# Patient Record
Sex: Male | Born: 1950
Health system: Southern US, Community
[De-identification: ages and names within clinical notes are randomized; demographics above are authoritative.]

## PROBLEM LIST (undated history)

## (undated) DIAGNOSIS — D702 Other drug-induced agranulocytosis: Secondary | ICD-10-CM

## (undated) DIAGNOSIS — J154 Pneumonia due to other streptococci: Secondary | ICD-10-CM

## (undated) DIAGNOSIS — E669 Obesity, unspecified: Secondary | ICD-10-CM

## (undated) DIAGNOSIS — I509 Heart failure, unspecified: Secondary | ICD-10-CM

## (undated) DIAGNOSIS — Z9889 Other specified postprocedural states: Secondary | ICD-10-CM

## (undated) DIAGNOSIS — D649 Anemia, unspecified: Secondary | ICD-10-CM

## (undated) DIAGNOSIS — K219 Gastro-esophageal reflux disease without esophagitis: Secondary | ICD-10-CM

## (undated) DIAGNOSIS — I272 Pulmonary hypertension, unspecified: Secondary | ICD-10-CM

## (undated) DIAGNOSIS — Z5111 Encounter for antineoplastic chemotherapy: Secondary | ICD-10-CM

## (undated) DIAGNOSIS — E039 Hypothyroidism, unspecified: Secondary | ICD-10-CM

## (undated) DIAGNOSIS — C3490 Malignant neoplasm of unspecified part of unspecified bronchus or lung: Secondary | ICD-10-CM

## (undated) DIAGNOSIS — C8590 Non-Hodgkin lymphoma, unspecified, unspecified site: Secondary | ICD-10-CM

## (undated) DIAGNOSIS — K56609 Unspecified intestinal obstruction, unspecified as to partial versus complete obstruction: Secondary | ICD-10-CM

## (undated) DIAGNOSIS — D751 Secondary polycythemia: Secondary | ICD-10-CM

## (undated) DIAGNOSIS — J449 Chronic obstructive pulmonary disease, unspecified: Secondary | ICD-10-CM

## (undated) DIAGNOSIS — F32A Depression, unspecified: Secondary | ICD-10-CM

## (undated) DIAGNOSIS — C8258 Diffuse follicle center lymphoma, lymph nodes of multiple sites: Secondary | ICD-10-CM

## (undated) DIAGNOSIS — N400 Enlarged prostate without lower urinary tract symptoms: Secondary | ICD-10-CM

## (undated) DIAGNOSIS — M1990 Unspecified osteoarthritis, unspecified site: Secondary | ICD-10-CM

## (undated) DIAGNOSIS — F329 Major depressive disorder, single episode, unspecified: Secondary | ICD-10-CM

## (undated) DIAGNOSIS — A419 Sepsis, unspecified organism: Secondary | ICD-10-CM

## (undated) DIAGNOSIS — I10 Essential (primary) hypertension: Secondary | ICD-10-CM

## (undated) HISTORY — DX: Essential (primary) hypertension: I10

## (undated) HISTORY — DX: Heart failure, unspecified: I50.9

## (undated) HISTORY — DX: Depression, unspecified: F32.A

## (undated) HISTORY — DX: Encounter for antineoplastic chemotherapy: Z51.11

## (undated) HISTORY — DX: Malignant neoplasm of unspecified part of unspecified bronchus or lung: C34.90

## (undated) HISTORY — PX: COLONOSCOPY: SHX174

## (undated) HISTORY — DX: Anemia, unspecified: D64.9

## (undated) HISTORY — DX: Benign prostatic hyperplasia without lower urinary tract symptoms: N40.0

## (undated) HISTORY — DX: Other specified postprocedural states: Z98.890

## (undated) HISTORY — DX: Chronic obstructive pulmonary disease, unspecified: J44.9

## (undated) HISTORY — DX: Diffuse follicle center lymphoma, lymph nodes of multiple sites: C82.58

## (undated) HISTORY — DX: Obesity, unspecified: E66.9

## (undated) HISTORY — DX: Hypothyroidism, unspecified: E03.9

## (undated) HISTORY — PX: UPPER GI ENDOSCOPY: SHX6162

## (undated) HISTORY — DX: Sepsis, unspecified organism: A41.9

## (undated) HISTORY — DX: Other drug-induced agranulocytosis: D70.2

## (undated) HISTORY — DX: Secondary polycythemia: D75.1

## (undated) HISTORY — PX: APPENDECTOMY: SHX54

## (undated) HISTORY — DX: Pulmonary hypertension, unspecified: I27.20

## (undated) HISTORY — DX: Major depressive disorder, single episode, unspecified: F32.9

## (undated) HISTORY — DX: Pneumonia due to other streptococci: J15.4

## (undated) HISTORY — DX: Unspecified intestinal obstruction, unspecified as to partial versus complete obstruction: K56.609

## (undated) HISTORY — DX: Non-Hodgkin lymphoma, unspecified, unspecified site: C85.90

## (undated) HISTORY — PX: BACK SURGERY: SHX140

---

## 2002-06-13 LAB — HM COLONOSCOPY: HM Colonoscopy: NORMAL

## 2008-04-21 DIAGNOSIS — J154 Pneumonia due to other streptococci: Secondary | ICD-10-CM

## 2008-04-21 HISTORY — DX: Pneumonia due to other streptococci: J15.4

## 2008-05-01 ENCOUNTER — Encounter: Payer: Self-pay | Admitting: Cardiology

## 2008-05-01 ENCOUNTER — Ambulatory Visit: Payer: Self-pay | Admitting: Pulmonary Disease

## 2008-05-01 ENCOUNTER — Ambulatory Visit: Payer: Self-pay | Admitting: Cardiology

## 2008-05-01 ENCOUNTER — Inpatient Hospital Stay (HOSPITAL_COMMUNITY): Admission: EM | Admit: 2008-05-01 | Discharge: 2008-05-21 | Payer: Self-pay | Admitting: Emergency Medicine

## 2008-05-09 ENCOUNTER — Ambulatory Visit: Payer: Self-pay | Admitting: Internal Medicine

## 2008-05-21 ENCOUNTER — Ambulatory Visit: Payer: Self-pay | Admitting: Internal Medicine

## 2008-05-29 ENCOUNTER — Other Ambulatory Visit: Payer: Self-pay | Admitting: Internal Medicine

## 2008-05-29 LAB — CBC WITH DIFFERENTIAL/PLATELET
BASO%: 0.2 % (ref 0.0–2.0)
LYMPH%: 14.4 % (ref 14.0–48.0)
MCHC: 32.3 g/dL (ref 32.0–35.9)
MONO#: 0.7 10*3/uL (ref 0.1–0.9)
MONO%: 9.4 % (ref 0.0–13.0)
NEUT#: 5.6 10*3/uL (ref 1.5–6.5)
Platelets: 222 10*3/uL (ref 145–400)
RBC: 4.96 10*6/uL (ref 4.20–5.71)
RDW: 16.8 % — ABNORMAL HIGH (ref 11.2–14.6)
WBC: 7.9 10*3/uL (ref 4.0–10.0)

## 2008-05-29 LAB — LACTATE DEHYDROGENASE: LDH: 136 U/L (ref 94–250)

## 2008-05-31 ENCOUNTER — Encounter: Payer: Self-pay | Admitting: Internal Medicine

## 2008-06-04 ENCOUNTER — Telehealth (INDEPENDENT_AMBULATORY_CARE_PROVIDER_SITE_OTHER): Payer: Self-pay | Admitting: *Deleted

## 2008-06-05 ENCOUNTER — Ambulatory Visit: Payer: Self-pay | Admitting: Internal Medicine

## 2008-06-05 DIAGNOSIS — D751 Secondary polycythemia: Secondary | ICD-10-CM

## 2008-06-05 DIAGNOSIS — I10 Essential (primary) hypertension: Secondary | ICD-10-CM | POA: Insufficient documentation

## 2008-06-05 DIAGNOSIS — J449 Chronic obstructive pulmonary disease, unspecified: Secondary | ICD-10-CM | POA: Insufficient documentation

## 2008-06-05 HISTORY — DX: Secondary polycythemia: D75.1

## 2008-06-05 HISTORY — DX: Essential (primary) hypertension: I10

## 2008-06-26 LAB — CBC WITH DIFFERENTIAL/PLATELET
BASO%: 0.4 % (ref 0.0–2.0)
Basophils Absolute: 0 10*3/uL (ref 0.0–0.1)
EOS%: 3.2 % (ref 0.0–7.0)
HCT: 37.7 % — ABNORMAL LOW (ref 38.7–49.9)
HGB: 12.5 g/dL — ABNORMAL LOW (ref 13.0–17.1)
LYMPH%: 24.1 % (ref 14.0–48.0)
MCH: 30 pg (ref 28.0–33.4)
MCHC: 33.2 g/dL (ref 32.0–35.9)
MONO#: 1 10*3/uL — ABNORMAL HIGH (ref 0.1–0.9)
NEUT%: 58.2 % (ref 40.0–75.0)
Platelets: 249 10*3/uL (ref 145–400)

## 2008-07-02 ENCOUNTER — Ambulatory Visit: Payer: Self-pay | Admitting: Cardiology

## 2008-07-05 ENCOUNTER — Ambulatory Visit: Payer: Self-pay | Admitting: Internal Medicine

## 2008-07-20 ENCOUNTER — Ambulatory Visit: Payer: Self-pay | Admitting: Internal Medicine

## 2008-07-24 ENCOUNTER — Encounter (HOSPITAL_COMMUNITY): Admission: RE | Admit: 2008-07-24 | Discharge: 2008-09-25 | Payer: Self-pay | Admitting: Internal Medicine

## 2008-07-24 ENCOUNTER — Encounter: Payer: Self-pay | Admitting: Internal Medicine

## 2008-07-24 LAB — CBC WITH DIFFERENTIAL/PLATELET
Basophils Absolute: 0 10*3/uL (ref 0.0–0.1)
Eosinophils Absolute: 0.2 10*3/uL (ref 0.0–0.5)
HCT: 32.8 % — ABNORMAL LOW (ref 38.7–49.9)
HGB: 11.2 g/dL — ABNORMAL LOW (ref 13.0–17.1)
LYMPH%: 22.5 % (ref 14.0–48.0)
MCV: 89.6 fL (ref 81.6–98.0)
MONO%: 7.6 % (ref 0.0–13.0)
NEUT#: 4.8 10*3/uL (ref 1.5–6.5)
Platelets: 256 10*3/uL (ref 145–400)

## 2008-08-07 ENCOUNTER — Encounter: Payer: Self-pay | Admitting: Internal Medicine

## 2008-09-03 ENCOUNTER — Ambulatory Visit: Payer: Self-pay | Admitting: Internal Medicine

## 2008-09-24 ENCOUNTER — Ambulatory Visit: Payer: Self-pay | Admitting: Internal Medicine

## 2008-09-24 DIAGNOSIS — F528 Other sexual dysfunction not due to a substance or known physiological condition: Secondary | ICD-10-CM | POA: Insufficient documentation

## 2008-11-22 ENCOUNTER — Ambulatory Visit: Payer: Self-pay | Admitting: Cardiology

## 2008-12-03 ENCOUNTER — Ambulatory Visit: Payer: Self-pay | Admitting: Internal Medicine

## 2008-12-05 ENCOUNTER — Encounter: Payer: Self-pay | Admitting: Internal Medicine

## 2009-02-22 ENCOUNTER — Ambulatory Visit: Payer: Self-pay | Admitting: Internal Medicine

## 2009-02-26 ENCOUNTER — Encounter: Payer: Self-pay | Admitting: Internal Medicine

## 2009-02-26 LAB — CBC WITH DIFFERENTIAL/PLATELET
Basophils Absolute: 0 10*3/uL (ref 0.0–0.1)
Eosinophils Absolute: 0.2 10*3/uL (ref 0.0–0.5)
HCT: 40.4 % (ref 38.4–49.9)
HGB: 13.6 g/dL (ref 13.0–17.1)
MCH: 30.2 pg (ref 27.2–33.4)
MONO#: 0.7 10*3/uL (ref 0.1–0.9)
NEUT#: 4.5 10*3/uL (ref 1.5–6.5)
NEUT%: 65.6 % (ref 39.0–75.0)
lymph#: 1.4 10*3/uL (ref 0.9–3.3)

## 2009-05-23 IMAGING — CR DG CHEST 1V PORT
2 series · 2 of 2 positions shown · non-contrast
Comparison: 05/14/2008

CLINICAL DATA: Respiratory failure/on ventilator

PORTABLE CHEST - 1 VIEW

[AP (1 of 2)]
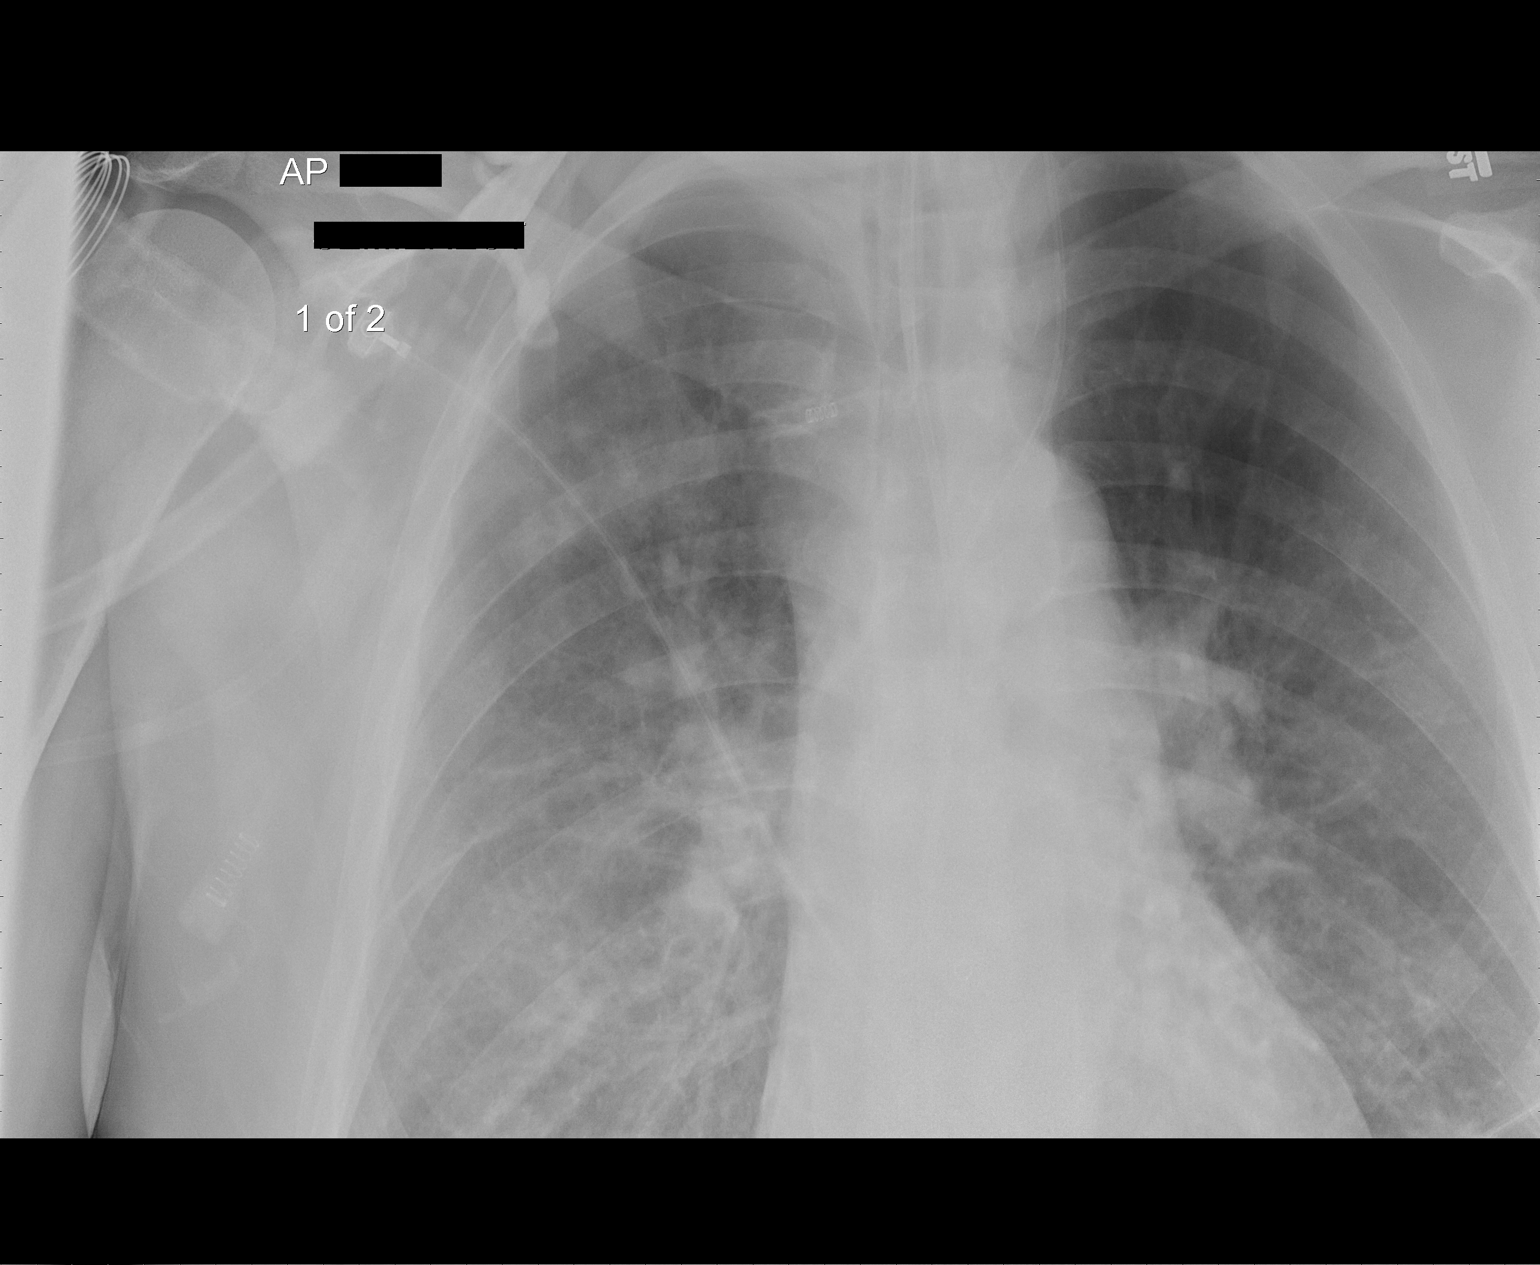

[AP (2 of 2)]
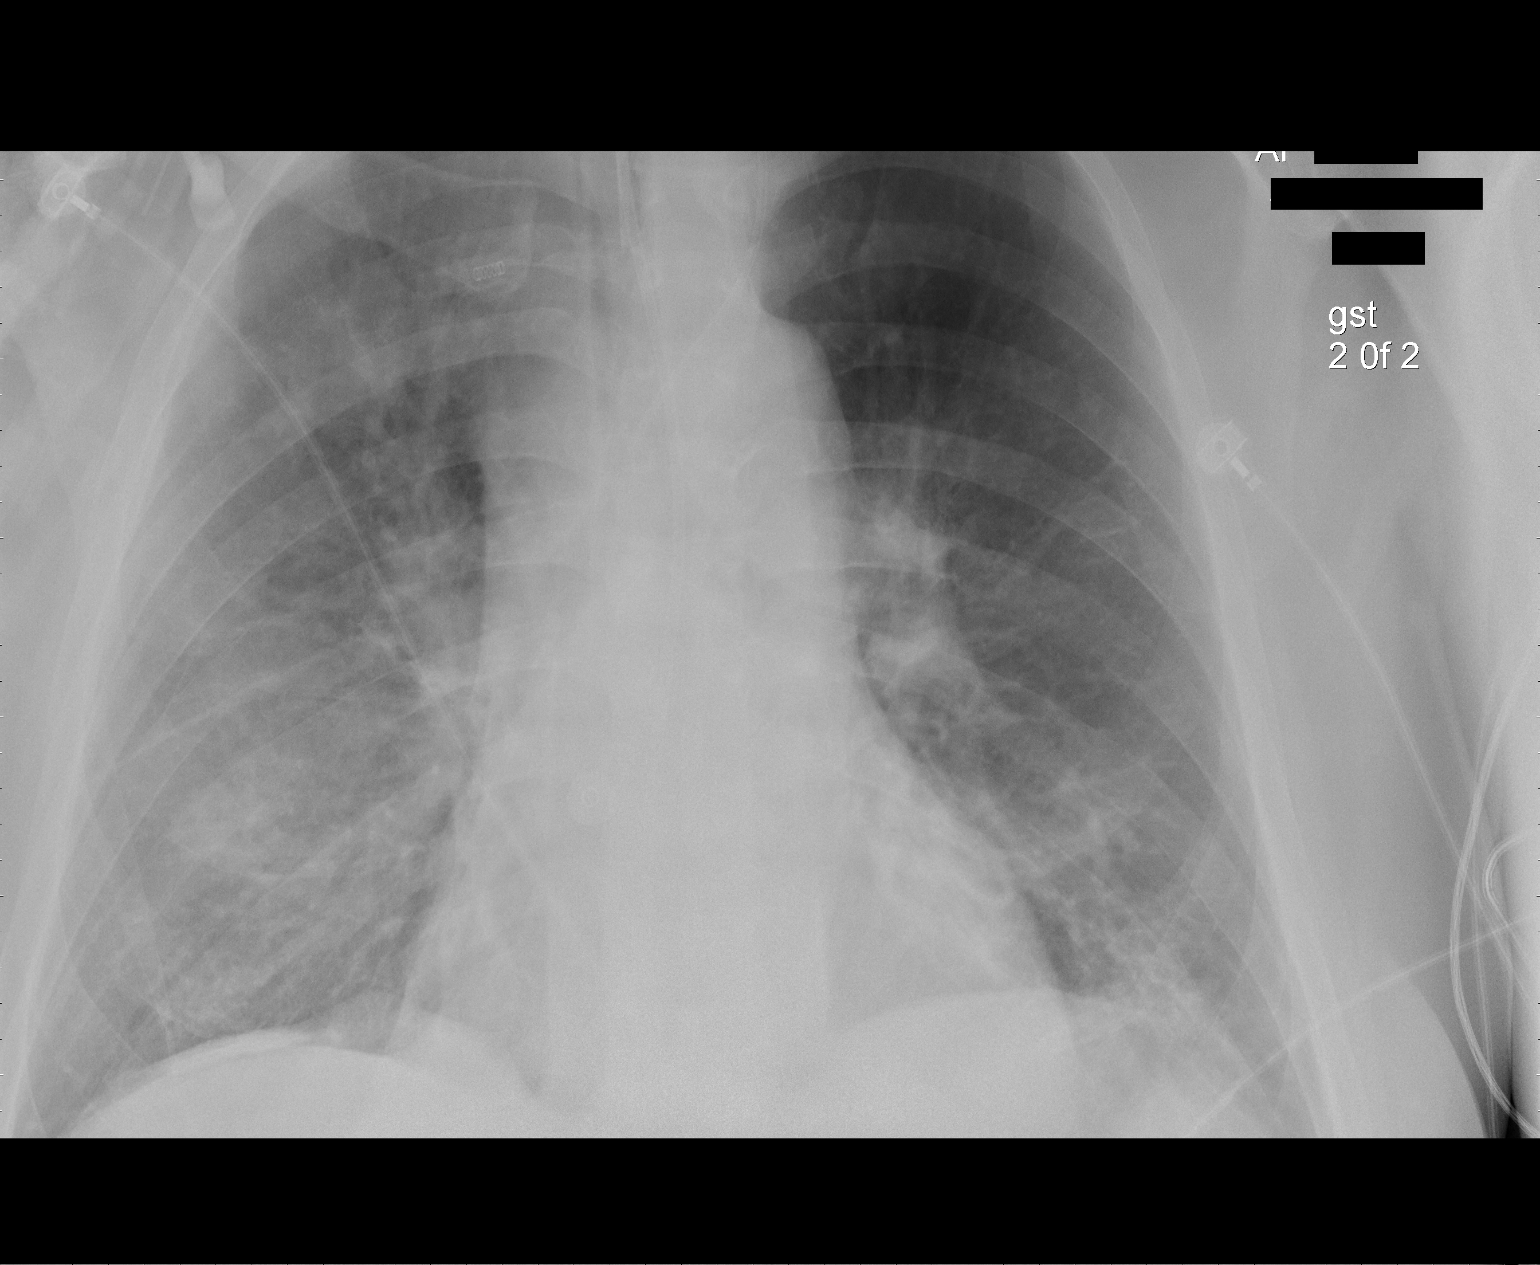

[2 of 2 positions shown; findings below may reference images not displayed]

FINDINGS: Bilateral lung densities are again noted.  There has been
slight interval worsening in the left lower lobe, and perhaps
minimal worsening in the right lower lobe, although the changes are
subtle.  Heart and mediastinal contours remain normal.  ET tube
unchanged.
IMPRESSION: 1.  Slight interval worsening in the left lower lobe and possibly
right lower lobe airspace densities.
2.  Support apparatus stable.

## 2009-05-23 IMAGING — CR DG ABD PORTABLE 1V
1 series · 1 of 1 positions shown · non-contrast
Comparison: 05/14/2008

CLINICAL DATA: Respiratory failure/follow-up ileus versus small
bowel obstruction

ABDOMEN - 1 VIEW

[AP]
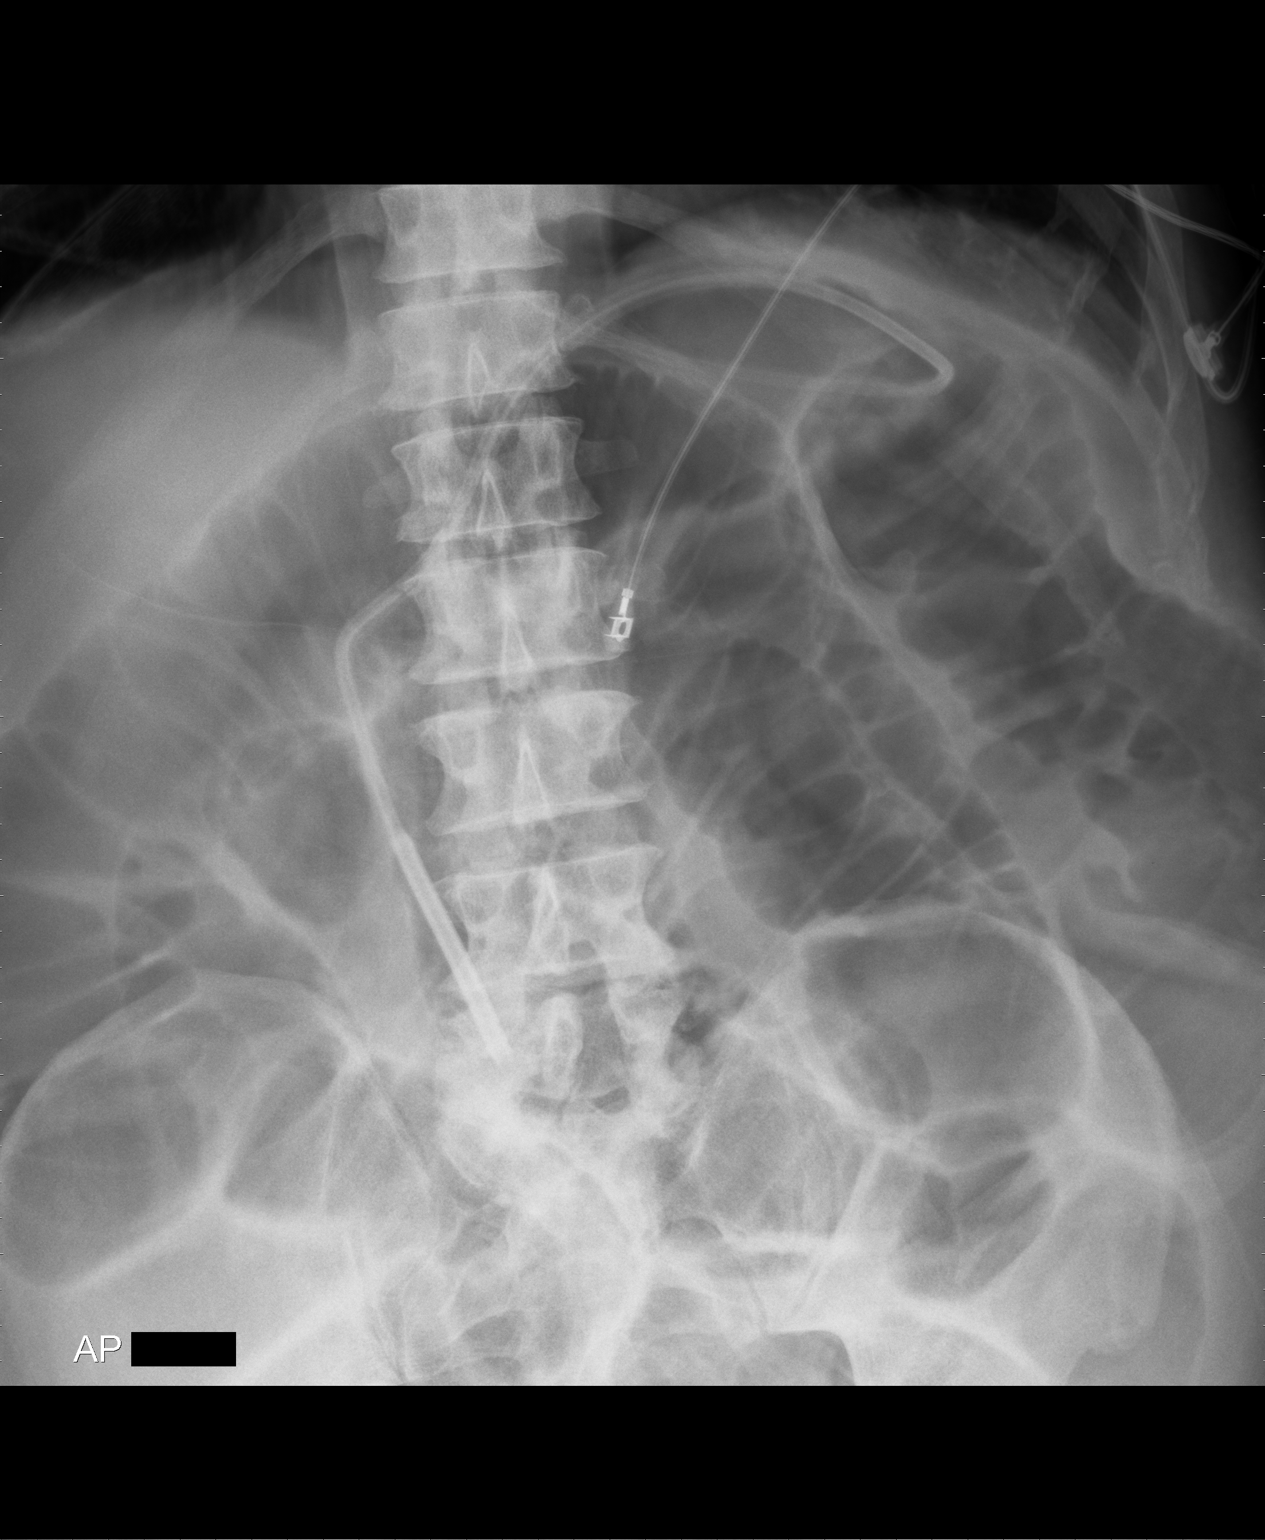

[1 of 1 positions shown; findings below may reference images not displayed]

FINDINGS: Diffuse dilatation of small bowel without significant
change.  The pattern is most consistent with obstruction since
there is little colon gas.  The

The sheath a feeding tube is now in the distal C-loop.  Formerly it
was coiled in the fundus of the stomach.  The
IMPRESSION: 1.  Diffuse dilatation of small bowel loops, most consistent with
obstruction, essentially unchanged.
2.  The feeding tube tip has advanced to the distal C-loop of the
duodenum.

## 2009-05-23 IMAGING — CR DG CHEST 1V PORT
1 series · 1 of 1 positions shown · non-contrast
Comparison: Chest radiograph of same date.

CLINICAL DATA: Respiratory failure.  PICC line placement.

PORTABLE CHEST - 1 VIEW

[AP]
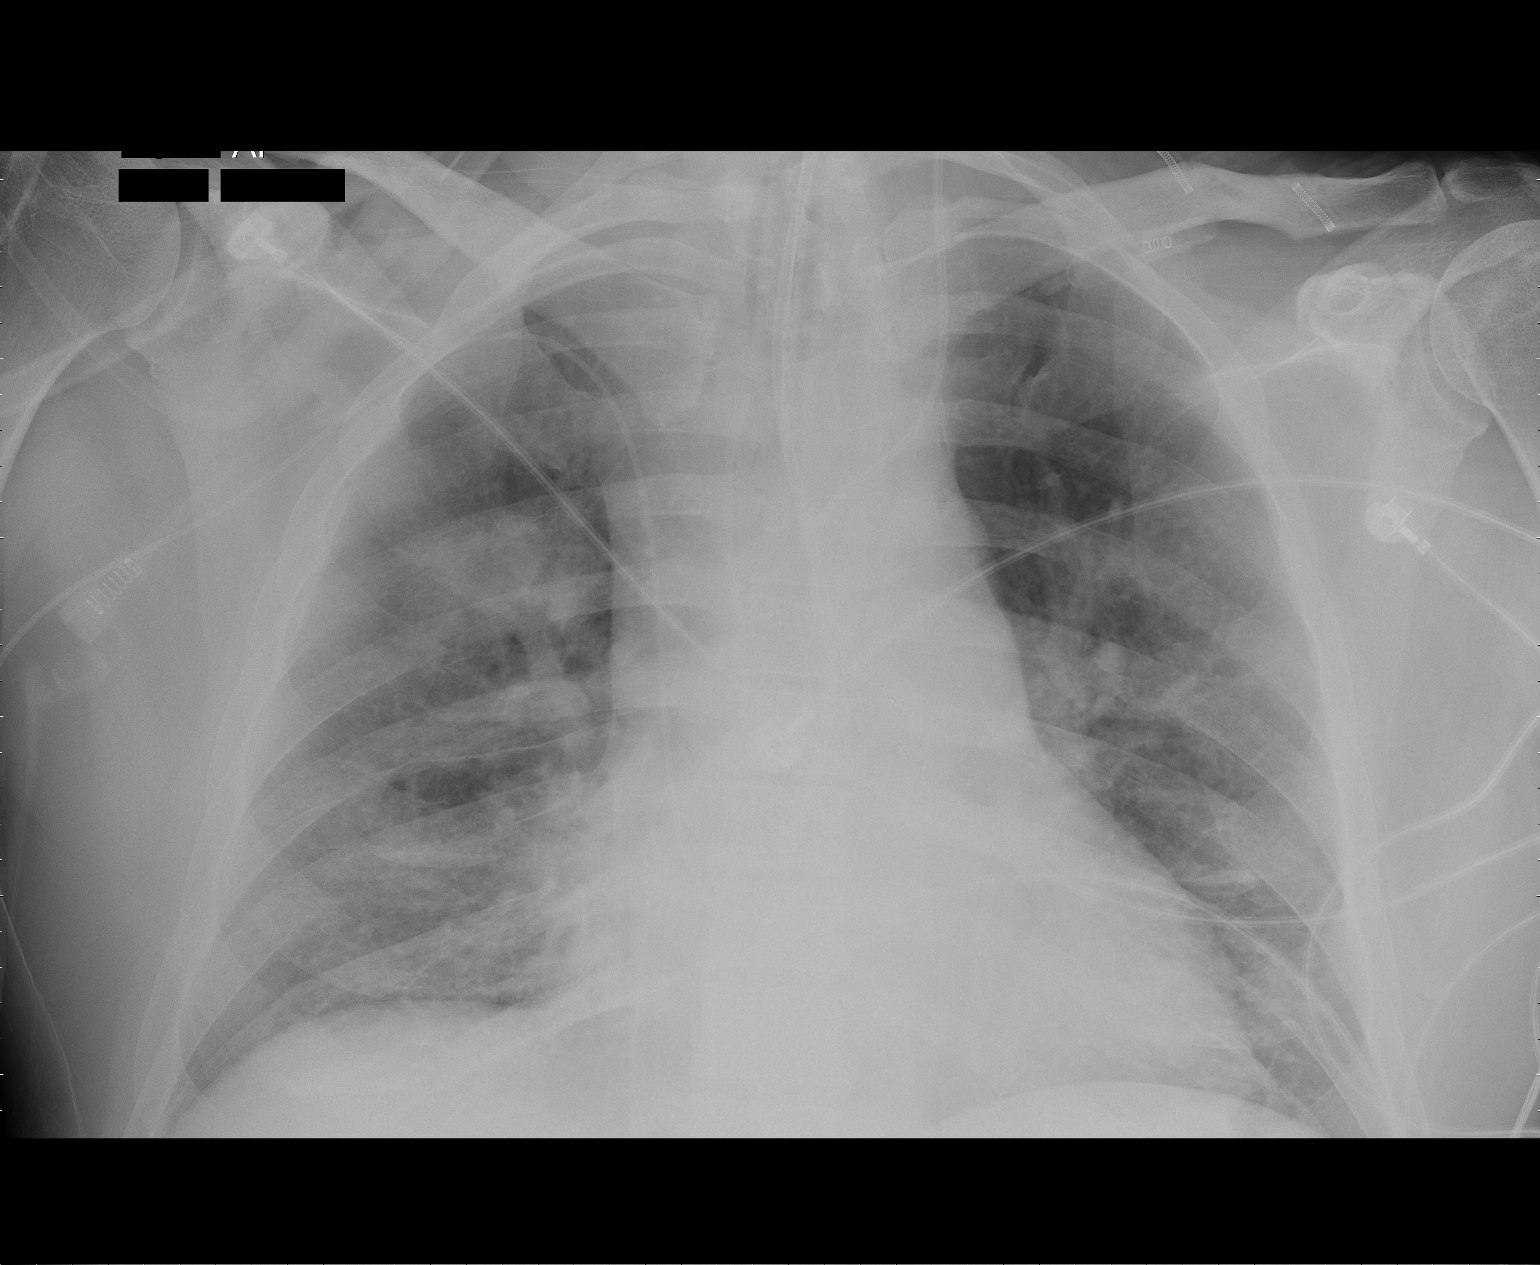

[1 of 1 positions shown; findings below may reference images not displayed]

FINDINGS: A new right PICC line is in place.  The tip is in the
right atrium.  Retraction of approximate 7 cm should place the
catheter above the cavoatrial junction.  Left IJ line stable
position.  The patient remains intubated.  Feeding tube courses off
the inferior border of the film.  Patchy bilateral airspace disease
is not significantly changed.  Lung volumes may be slightly
decreased compared the prior exam.
IMPRESSION: 1.  Interval placement of right-sided PICC line with the tip in the
right atrium.  This could be pulled back at least 7 cm for more
optimal positioning.
2.  Remainder of support apparatus is stable.
3.  Stable bilateral airspace disease.

Critical test results telephoned to Dr. Dragonas at the time of
interpretation on date 05/15/2008 at time [DATE] a.m..

## 2009-05-24 IMAGING — CR DG ABD PORTABLE 1V
1 series · 1 of 1 positions shown · non-contrast
Comparison: 05/15/2008.

*RADIOLOGY REPORT
CLINICAL DATA: feeding tube placement

ABDOMEN - 1 VIEW

[view not recorded]
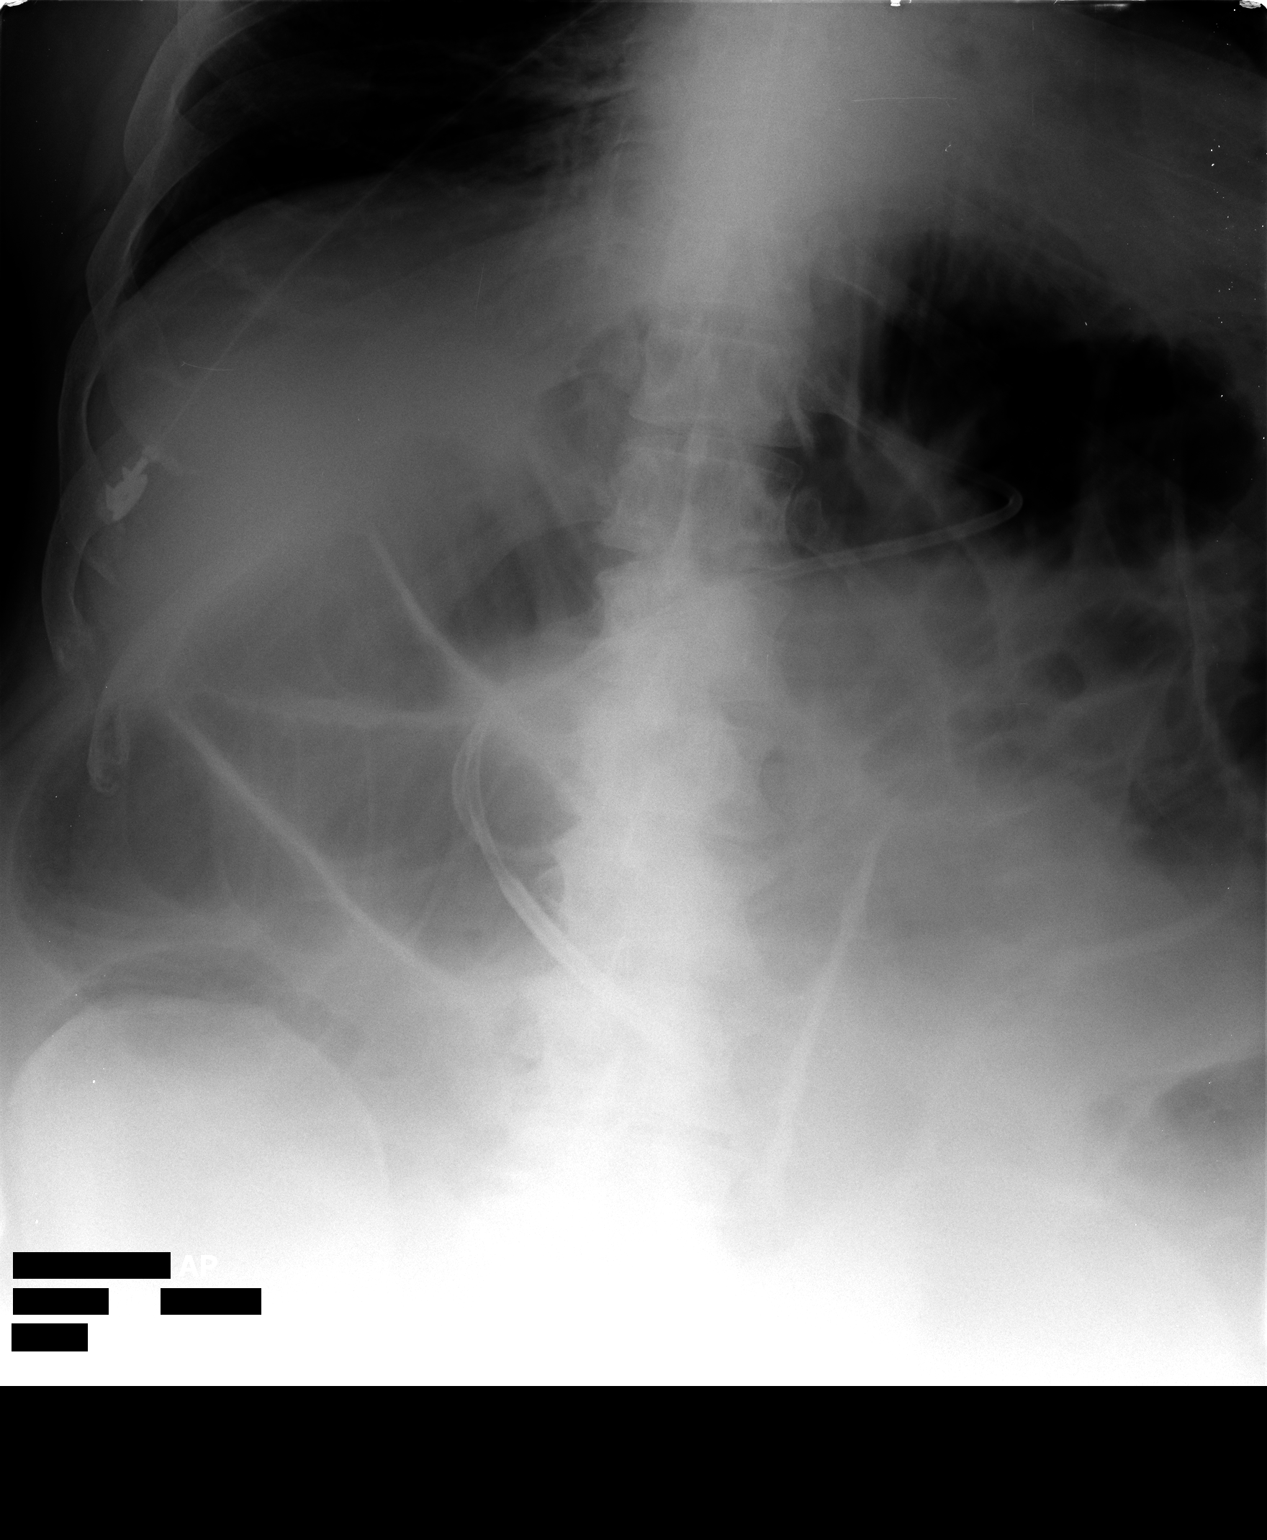

[1 of 1 positions shown; findings below may reference images not displayed]

FINDINGS: A feeding tube is present.  The tip of the tube is in the
region of the second to third portion of the duodenum.  The tube
does loop upon itself within the antrum or first portion of the
duodenum. There is marked distension of small bowel loops.  The gas
pattern would be most consistent with a small bowel obstructive
pattern-unchanged.
IMPRESSION: 1.  Feeding tube tip in the region of the second or third portion
of the duodenum as discussed above.
2.  Bowel gas pattern most consistent with small bowel obstructive
pattern.

## 2009-06-14 ENCOUNTER — Ambulatory Visit: Payer: Self-pay | Admitting: Internal Medicine

## 2009-09-21 DIAGNOSIS — N400 Enlarged prostate without lower urinary tract symptoms: Secondary | ICD-10-CM

## 2009-09-21 DIAGNOSIS — C3490 Malignant neoplasm of unspecified part of unspecified bronchus or lung: Secondary | ICD-10-CM | POA: Insufficient documentation

## 2009-09-21 HISTORY — PX: LUNG CANCER SURGERY: SHX702

## 2009-09-21 HISTORY — DX: Benign prostatic hyperplasia without lower urinary tract symptoms: N40.0

## 2009-09-21 HISTORY — DX: Malignant neoplasm of unspecified part of unspecified bronchus or lung: C34.90

## 2010-05-30 ENCOUNTER — Ambulatory Visit: Payer: Self-pay | Admitting: Cardiology

## 2010-06-06 ENCOUNTER — Telehealth: Payer: Self-pay | Admitting: Internal Medicine

## 2010-06-12 ENCOUNTER — Ambulatory Visit: Payer: Self-pay | Admitting: Internal Medicine

## 2010-06-12 DIAGNOSIS — J180 Bronchopneumonia, unspecified organism: Secondary | ICD-10-CM | POA: Insufficient documentation

## 2010-06-12 DIAGNOSIS — J984 Other disorders of lung: Secondary | ICD-10-CM | POA: Insufficient documentation

## 2010-06-12 HISTORY — DX: Bronchopneumonia, unspecified organism: J18.0

## 2010-07-11 ENCOUNTER — Ambulatory Visit (HOSPITAL_COMMUNITY): Admission: RE | Admit: 2010-07-11 | Discharge: 2010-07-11 | Payer: Self-pay | Admitting: Internal Medicine

## 2010-07-24 ENCOUNTER — Telehealth (INDEPENDENT_AMBULATORY_CARE_PROVIDER_SITE_OTHER): Payer: Self-pay | Admitting: *Deleted

## 2010-08-07 ENCOUNTER — Ambulatory Visit: Payer: Self-pay | Admitting: Internal Medicine

## 2010-08-11 ENCOUNTER — Encounter: Payer: Self-pay | Admitting: Internal Medicine

## 2010-08-11 ENCOUNTER — Ambulatory Visit
Admission: RE | Admit: 2010-08-11 | Discharge: 2010-08-11 | Payer: Self-pay | Source: Home / Self Care | Admitting: Internal Medicine

## 2010-08-20 ENCOUNTER — Ambulatory Visit: Payer: Self-pay | Admitting: Thoracic Surgery

## 2010-08-27 ENCOUNTER — Telehealth: Payer: Self-pay | Admitting: Internal Medicine

## 2010-09-04 ENCOUNTER — Inpatient Hospital Stay (HOSPITAL_COMMUNITY)
Admission: RE | Admit: 2010-09-04 | Discharge: 2010-09-08 | Payer: Self-pay | Source: Home / Self Care | Attending: Thoracic Surgery | Admitting: Thoracic Surgery

## 2010-09-04 ENCOUNTER — Encounter: Payer: Self-pay | Admitting: Thoracic Surgery

## 2010-09-17 ENCOUNTER — Encounter
Admission: RE | Admit: 2010-09-17 | Discharge: 2010-09-17 | Payer: Self-pay | Source: Home / Self Care | Attending: Thoracic Surgery | Admitting: Thoracic Surgery

## 2010-09-17 ENCOUNTER — Ambulatory Visit: Payer: Self-pay | Admitting: Thoracic Surgery

## 2010-09-26 ENCOUNTER — Inpatient Hospital Stay (HOSPITAL_COMMUNITY)
Admission: EM | Admit: 2010-09-26 | Discharge: 2010-09-29 | Payer: Self-pay | Source: Home / Self Care | Attending: Thoracic Surgery | Admitting: Thoracic Surgery

## 2010-09-26 ENCOUNTER — Telehealth: Payer: Self-pay | Admitting: Internal Medicine

## 2010-09-26 ENCOUNTER — Encounter: Payer: Self-pay | Admitting: Pulmonary Disease

## 2010-09-26 DIAGNOSIS — C3490 Malignant neoplasm of unspecified part of unspecified bronchus or lung: Secondary | ICD-10-CM | POA: Insufficient documentation

## 2010-09-26 HISTORY — DX: Malignant neoplasm of unspecified part of unspecified bronchus or lung: C34.90

## 2010-09-26 LAB — CBC
HCT: 39.9 % (ref 39.0–52.0)
Hemoglobin: 12.9 g/dL — ABNORMAL LOW (ref 13.0–17.0)
MCH: 28.4 pg (ref 26.0–34.0)
MCHC: 32.3 g/dL (ref 30.0–36.0)
MCV: 87.9 fL (ref 78.0–100.0)
Platelets: 258 10*3/uL (ref 150–400)
RBC: 4.54 MIL/uL (ref 4.22–5.81)
RDW: 13.3 % (ref 11.5–15.5)
WBC: 16.7 10*3/uL — ABNORMAL HIGH (ref 4.0–10.5)

## 2010-09-26 LAB — BASIC METABOLIC PANEL
BUN: 23 mg/dL (ref 6–23)
CO2: 23 mEq/L (ref 19–32)
Calcium: 8.9 mg/dL (ref 8.4–10.5)
Chloride: 104 mEq/L (ref 96–112)
Creatinine, Ser: 1.24 mg/dL (ref 0.4–1.5)
GFR calc Af Amer: >60 mL/min (ref >60)
GFR calc non Af Amer: 59 mL/min — ABNORMAL LOW (ref >60)
Glucose, Bld: 108 mg/dL — ABNORMAL HIGH (ref 70–99)
Potassium: 4.3 mEq/L (ref 3.5–5.1)
Sodium: 138 mEq/L (ref 135–145)

## 2010-09-26 LAB — DIFFERENTIAL
Basophils Absolute: 0 10*3/uL (ref 0.0–0.1)
Basophils Relative: 0 % (ref 0–1)
Eosinophils Absolute: 0.1 10*3/uL (ref 0.0–0.7)
Eosinophils Relative: 1 % (ref 0–5)
Lymphocytes Relative: 5 % — ABNORMAL LOW (ref 12–46)
Lymphs Abs: 0.9 10*3/uL (ref 0.7–4.0)
Monocytes Absolute: 0.8 10*3/uL (ref 0.1–1.0)
Monocytes Relative: 5 % (ref 3–12)
Neutro Abs: 15 10*3/uL — ABNORMAL HIGH (ref 1.7–7.7)
Neutrophils Relative %: 90 % — ABNORMAL HIGH (ref 43–77)

## 2010-09-29 ENCOUNTER — Ambulatory Visit: Admit: 2010-09-29 | Payer: Self-pay | Admitting: Internal Medicine

## 2010-10-06 LAB — EXPECTORATED SPUTUM ASSESSMENT W GRAM STAIN, RFLX TO RESP C

## 2010-10-06 LAB — CULTURE, BLOOD (ROUTINE X 2)
Culture  Setup Time: 201201061442
Culture  Setup Time: 201201061442
Culture: NO GROWTH
Culture: NO GROWTH

## 2010-10-06 LAB — CULTURE, RESPIRATORY W GRAM STAIN

## 2010-10-06 LAB — CBC
HCT: 34.5 % — ABNORMAL LOW (ref 39.0–52.0)
HCT: 32.6 % — ABNORMAL LOW (ref 39.0–52.0)
Hemoglobin: 11.1 g/dL — ABNORMAL LOW (ref 13.0–17.0)
Hemoglobin: 10.3 g/dL — ABNORMAL LOW (ref 13.0–17.0)
MCH: 27.5 pg (ref 26.0–34.0)
MCH: 28.1 pg (ref 26.0–34.0)
MCHC: 31 g/dL (ref 30.0–36.0)
MCHC: 31.6 g/dL (ref 30.0–36.0)
MCV: 88.7 fL (ref 78.0–100.0)
MCV: 88.8 fL (ref 78.0–100.0)
Platelets: 215 10*3/uL (ref 150–400)
Platelets: 216 10*3/uL (ref 150–400)
RBC: 3.89 MIL/uL — ABNORMAL LOW (ref 4.22–5.81)
RBC: 3.67 MIL/uL — ABNORMAL LOW (ref 4.22–5.81)
RDW: 13.4 % (ref 11.5–15.5)
RDW: 13.2 % (ref 11.5–15.5)
WBC: 12.2 10*3/uL — ABNORMAL HIGH (ref 4.0–10.5)
WBC: 8.9 10*3/uL (ref 4.0–10.5)

## 2010-10-06 LAB — BASIC METABOLIC PANEL
BUN: 15 mg/dL (ref 6–23)
CO2: 28 mEq/L (ref 19–32)
Calcium: 8.6 mg/dL (ref 8.4–10.5)
Chloride: 103 mEq/L (ref 96–112)
Creatinine, Ser: 1.17 mg/dL (ref 0.4–1.5)
GFR calc Af Amer: >60 mL/min (ref >60)
GFR calc non Af Amer: >60 mL/min (ref >60)
Glucose, Bld: 126 mg/dL — ABNORMAL HIGH (ref 70–99)
Potassium: 4.9 mEq/L (ref 3.5–5.1)
Sodium: 136 mEq/L (ref 135–145)

## 2010-10-07 ENCOUNTER — Ambulatory Visit
Admission: RE | Admit: 2010-10-07 | Discharge: 2010-10-07 | Payer: Self-pay | Source: Home / Self Care | Attending: Thoracic Surgery | Admitting: Thoracic Surgery

## 2010-10-07 ENCOUNTER — Encounter
Admission: RE | Admit: 2010-10-07 | Discharge: 2010-10-07 | Payer: Self-pay | Source: Home / Self Care | Attending: Thoracic Surgery | Admitting: Thoracic Surgery

## 2010-10-08 ENCOUNTER — Ambulatory Visit: Admit: 2010-10-08 | Payer: Self-pay | Admitting: Thoracic Surgery

## 2010-10-09 ENCOUNTER — Ambulatory Visit
Admission: RE | Admit: 2010-10-09 | Discharge: 2010-10-09 | Payer: Self-pay | Source: Home / Self Care | Attending: Internal Medicine | Admitting: Internal Medicine

## 2010-10-11 ENCOUNTER — Encounter: Payer: Self-pay | Admitting: Internal Medicine

## 2010-10-19 ENCOUNTER — Inpatient Hospital Stay (HOSPITAL_COMMUNITY)
Admission: EM | Admit: 2010-10-19 | Discharge: 2010-10-24 | DRG: 089 | Disposition: A | Discharge status: Home / Self Care | Payer: BC Managed Care – PPO | Attending: Internal Medicine | Admitting: Internal Medicine

## 2010-10-19 DIAGNOSIS — J189 Pneumonia, unspecified organism: Principal | ICD-10-CM | POA: Diagnosis present

## 2010-10-19 DIAGNOSIS — I10 Essential (primary) hypertension: Secondary | ICD-10-CM | POA: Diagnosis present

## 2010-10-19 DIAGNOSIS — J449 Chronic obstructive pulmonary disease, unspecified: Secondary | ICD-10-CM | POA: Diagnosis present

## 2010-10-19 DIAGNOSIS — E039 Hypothyroidism, unspecified: Secondary | ICD-10-CM | POA: Diagnosis present

## 2010-10-19 DIAGNOSIS — Z7982 Long term (current) use of aspirin: Secondary | ICD-10-CM

## 2010-10-19 DIAGNOSIS — T368X5A Adverse effect of other systemic antibiotics, initial encounter: Secondary | ICD-10-CM | POA: Diagnosis present

## 2010-10-19 DIAGNOSIS — Z87891 Personal history of nicotine dependence: Secondary | ICD-10-CM

## 2010-10-19 DIAGNOSIS — D72829 Elevated white blood cell count, unspecified: Secondary | ICD-10-CM | POA: Diagnosis present

## 2010-10-19 DIAGNOSIS — J4489 Other specified chronic obstructive pulmonary disease: Secondary | ICD-10-CM | POA: Diagnosis present

## 2010-10-19 DIAGNOSIS — Z85118 Personal history of other malignant neoplasm of bronchus and lung: Secondary | ICD-10-CM

## 2010-10-19 DIAGNOSIS — R1310 Dysphagia, unspecified: Secondary | ICD-10-CM | POA: Diagnosis present

## 2010-10-19 DIAGNOSIS — L270 Generalized skin eruption due to drugs and medicaments taken internally: Secondary | ICD-10-CM | POA: Diagnosis present

## 2010-10-19 DIAGNOSIS — N529 Male erectile dysfunction, unspecified: Secondary | ICD-10-CM | POA: Diagnosis present

## 2010-10-19 DIAGNOSIS — J322 Chronic ethmoidal sinusitis: Secondary | ICD-10-CM | POA: Diagnosis present

## 2010-10-19 DIAGNOSIS — H9190 Unspecified hearing loss, unspecified ear: Secondary | ICD-10-CM | POA: Diagnosis present

## 2010-10-19 LAB — BASIC METABOLIC PANEL
BUN: 21 mg/dL (ref 6–23)
CO2: 25 mEq/L (ref 19–32)
Calcium: 9 mg/dL (ref 8.4–10.5)
Creatinine, Ser: 1.19 mg/dL (ref 0.4–1.5)
GFR calc non Af Amer: >60 mL/min (ref >60)
Glucose, Bld: 107 mg/dL — ABNORMAL HIGH (ref 70–99)

## 2010-10-19 LAB — CBC
HCT: 38.8 % — ABNORMAL LOW (ref 39.0–52.0)
MCHC: 32.2 g/dL (ref 30.0–36.0)
Platelets: 201 10*3/uL (ref 150–400)
RDW: 13 % (ref 11.5–15.5)
WBC: 14.7 10*3/uL — ABNORMAL HIGH (ref 4.0–10.5)

## 2010-10-19 LAB — DIFFERENTIAL
Basophils Absolute: 0 10*3/uL (ref 0.0–0.1)
Basophils Relative: 0 % (ref 0–1)
Eosinophils Relative: 0 % (ref 0–5)
Lymphocytes Relative: 6 % — ABNORMAL LOW (ref 12–46)
Monocytes Absolute: 0.9 10*3/uL (ref 0.1–1.0)
Neutro Abs: 12.9 10*3/uL — ABNORMAL HIGH (ref 1.7–7.7)

## 2010-10-19 LAB — COMPREHENSIVE METABOLIC PANEL
Albumin: 3.3 g/dL — ABNORMAL LOW (ref 3.5–5.2)
Alkaline Phosphatase: 82 U/L (ref 39–117)
BUN: 19 mg/dL (ref 6–23)
CO2: 28 mEq/L (ref 19–32)
Chloride: 102 mEq/L (ref 96–112)
Creatinine, Ser: 1.15 mg/dL (ref 0.4–1.5)
GFR calc non Af Amer: >60 mL/min (ref >60)
Glucose, Bld: 118 mg/dL — ABNORMAL HIGH (ref 70–99)
Potassium: 4.6 mEq/L (ref 3.5–5.1)
Total Bilirubin: 1.2 mg/dL (ref 0.3–1.2)

## 2010-10-19 LAB — PROCALCITONIN: Procalcitonin: 17.58 ng/mL

## 2010-10-19 LAB — POCT CARDIAC MARKERS
CKMB, poc: 1.5 ng/mL (ref 1.0–8.0)
Myoglobin, poc: 217 ng/mL (ref 12–200)

## 2010-10-20 LAB — CBC
MCH: 28.4 pg (ref 26.0–34.0)
MCV: 84.4 fL (ref 78.0–100.0)
Platelets: 190 10*3/uL (ref 150–400)
RDW: 13.1 % (ref 11.5–15.5)
WBC: 12.3 10*3/uL — ABNORMAL HIGH (ref 4.0–10.5)

## 2010-10-20 LAB — URINALYSIS, ROUTINE W REFLEX MICROSCOPIC
Bilirubin Urine: NEGATIVE
Hgb urine dipstick: NEGATIVE
Ketones, ur: NEGATIVE mg/dL
Protein, ur: NEGATIVE mg/dL
Urine Glucose, Fasting: NEGATIVE mg/dL

## 2010-10-20 LAB — BASIC METABOLIC PANEL
BUN: 16 mg/dL (ref 6–23)
Creatinine, Ser: 1.03 mg/dL (ref 0.4–1.5)
GFR calc Af Amer: >60 mL/min (ref >60)
GFR calc non Af Amer: >60 mL/min (ref >60)
Potassium: 3.9 mEq/L (ref 3.5–5.1)

## 2010-10-20 LAB — EXPECTORATED SPUTUM ASSESSMENT W GRAM STAIN, RFLX TO RESP C

## 2010-10-20 LAB — STREP PNEUMONIAE URINARY ANTIGEN: Strep Pneumo Urinary Antigen: NEGATIVE

## 2010-10-20 LAB — DIFFERENTIAL
Basophils Relative: 0 % (ref 0–1)
Eosinophils Absolute: 0.1 10*3/uL (ref 0.0–0.7)
Eosinophils Relative: 1 % (ref 0–5)
Lymphs Abs: 0.8 10*3/uL (ref 0.7–4.0)

## 2010-10-20 LAB — TSH: TSH: 0.012 u[IU]/mL — ABNORMAL LOW (ref 0.350–4.500)

## 2010-10-21 LAB — LEGIONELLA ANTIGEN, URINE

## 2010-10-21 LAB — URINE CULTURE: Culture: NO GROWTH

## 2010-10-21 LAB — CBC
Hemoglobin: 11.4 g/dL — ABNORMAL LOW (ref 13.0–17.0)
MCHC: 31.8 g/dL (ref 30.0–36.0)
RBC: 4.13 MIL/uL — ABNORMAL LOW (ref 4.22–5.81)

## 2010-10-21 LAB — EXPECTORATED SPUTUM ASSESSMENT W GRAM STAIN, RFLX TO RESP C

## 2010-10-21 NOTE — Assessment & Plan Note (Signed)
Summary: discuss ct results//jrc   Visit Type:  Follow-up Primary Provider/Referring Provider:  Dr. Arrie Aran in [place]  CC:  Pt here to discuss CT results. .  History of Present Illness: BRIEF: EX-smoker, EX-etoh. Gold STage 2 COPD and LUL apical pulm nodule (since august 2009), RUL post segment post pneumonic infiltrate (since oct 2009). [facility] patient since August 2009 when admitted to ICU for  Strep PNA bilateral bibaasl consolidation neeeding APRV.    OV 06/12/2010: [facility] for [employer]  Last ct scan in sept 2010 showed stability. A repeat ct scan was done 05/30/2010 and he is here for fu. AT time of CT scan he had fever and AECOPD , pneumonia symptims. HE ws treated by PMD with antibiptic and currently feels back to baseline. But CT does show new RUL anterior segment pneumonic infitlrates c/w infectious symptoms. The RUL posterior segment infiltrate is stable/decreased  now since Oct 2009 and therefore represents a scar. However, the LUL apical nodule appears to have grown to 8-69mm.  In terms of COPD, there are no complaints. Using spiriva regularly. 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 Weight stable. Agrees for flu shot.    OV 12/03/2008: Mr. Cosens returns for review of followup CT chest; RUL nodulear density. He is fine except that recently he worked hard in [employer] indoor with 300# bale of hay. After this, he became more dyspneic and had chest tightness. Per wife, his home pox was low at 88% for  a few days but then he improved spontaneously. Currently feels well. He is taking his spiriva only intermittently and does not think it makes any difference to his respiratory status. He is now taking CIALIS for ED wihtout problems.   Preventive Screening-Counseling & Management  Alcohol-Tobacco     Smoking Status: quit     Year Quit: 2002     Pack years: 42 yrs 21/2 yrs     Tobacco Counseling: not to resume use of tobacco products  Current  Medications (verified): 1)  Adprin B 325 Mg Tabs (Aspirin Buf(Cacarb-Mgcarb-Mgo)) .... Once Daily 2)  Synthroid 300 Mcg Tabs (Levothyroxine Sodium) .... Once Daily 3)  Spiriva Handihaler 18 Mcg Caps (Tiotropium Bromide Monohydrate) .... Once Daily 4)  Bisoprolol-Hydrochlorothiazide 2.5-6.25 Mg Tabs (Bisoprolol-Hydrochlorothiazide) .... Once Daily  Allergies (verified): No Known Drug Allergies  Past History:  Past medical, surgical, family and social histories (including risk factors) reviewed, and no changes noted (except as noted below).  Past Medical History: #ICU Illness - August 2009   - Strep PNA.  Aug 11-30, 2009. Needing APRV. Cleared on CT chest Oct 2009   -  Congestive heart failure with diastolic dysfunction. Normal coronaries on cath 05/04/2008   -  Gastrointestinal obstruction ileus.   - Icu agitation/delirium  - Pulm Htn 05/04/2008 - PCWP 17. PA 56/32, CO 9-10L/min.  placed on expectant followup  #. Polycythemia.Marland KitchenMarland KitchenDr Shirline Frees    - diagnpsed 04/2008  -  Phlebotomy under consideration   #. Hypothyrodisim  #GOLD STAGE 2 COPD..........................Marland KitchenRamaswamy    Cleda Daub 12/03/2008 - FEv1 2.4L/70%   - Rx spiriva  #Obesity    - intentional weight loss after critical illness  #PULMONARY NODULE - LUL apical nodule  - 04/2008 - 6mm   - 08/2008 - 6-7 mm ; no chnage  - 05/2009 - no chnage - 05/2010: 9mm -10mm.  growth +    - 07/2010: PET SCAN Ordered  #RUL Posterior segment consolidative process...  - New Oct 2009  -  Dec 2009: Mild reduction  - March 2010 - Stable compared to dec 2009  - Sept 2010 - further reduction  - SEpt 2011 - stable -> No further followup  #RUL Anterior segment pneumonic process  - Sept 2011: New on CT. Aecopd symptoms +  - Nov 2011: FU CT pendnig  Past Surgical History: Reviewed history from 06/05/2008 and no changes required. appendectomy and some type of back surgery  RT and LHC - 05/04/2008 1. Right atrial pressure 15.   2. RV 56/40.    3. Pulmonary artery 56/32.   4. Pulmonary capillary wedge 17.   5. Aortic 118/18.   6. LV 120/82, mean 99.   7. No gradient on pullback across aortic valve.   8. No LV pulmonary capillary wedge gradient.   9. Superior vena cava saturation 75%.   10.Pulmonary artery saturation 78%.  12. Normal Left heart cath   11.Aortic saturation 95%.   12.Fick cardiac output 10.9 L/min.   13.Thermodilution cardiac output 8.9 L  Family History: Reviewed history from 06/05/2008 and no changes required. heart disease-father Mother is alive and well at the age of 78.  His father   died in his 50s with some type of heart problems.  He has three sisters   that are living and wife feels that one of his sister has some type of   lung problem.   Social History: Reviewed history from 06/05/2008 and no changes required. The patient resides in [place] with his wife.  They   have one daughter and one granddaughter.  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  He has not   smoked in 7 years.  Prior to that, he was smoking at least three packs   per day for unknown duration.  The patient's wife states that he has had   a history of heavy alcohol use, but denies actually alcoholism or   problems with withdrawal. He drinks approximately a six-pack   a day but quit 04/2008 after admission for PNA.  She denies any known drug or herbal medication use.  The patient did  not exercise nor does he follow a specific diet bu after 04/2008 admission for pna; exercises daily and is following a diet  Review of Systems  The patient denies shortness of breath with activity, shortness of breath at rest, productive cough, non-productive cough, coughing up blood, chest pain, irregular heartbeats, acid heartburn, indigestion, loss of appetite, weight change, abdominal pain, difficulty swallowing, sore throat, tooth/dental problems, headaches, nasal congestion/difficulty breathing through  nose, sneezing, itching, ear ache, anxiety, depression, hand/feet swelling, joint stiffness or pain, rash, change in color of mucus, and fever.    Vital Signs:  Patient profile:   60 year old male Height:      72 inches Weight:      225.38 pounds BMI:     30.68 O2 Sat:      95 % on Room air Temp:     97.9 degrees F oral Pulse rate:   53 / minute BP sitting:   120 / 80  (right arm) Cuff size:   regular  Vitals Entered By: Carron Curie CMA (June 12, 2010 1:51 PM)  O2 Flow:  Room air CC: Pt here to discuss CT results.  Comments Medications reviewed with patient Carron Curie CMA  June 12, 2010 1:51 PM Daytime phone number verified with patient.    Physical Exam  General:  overweight.  Head:  normocephalic and atraumatic Eyes:  PERRLA/EOM intact; conjunctiva and sclera clear Ears:  TMs intact and clear with normal canals Nose:  no deformity, discharge, inflammation, or lesions Mouth:  no deformity or lesions Neck:  no masses, thyromegaly, or abnormal cervical nodes Chest Wall:  no deformities noted Lungs:  clear bilaterally to auscultation and percussion Heart:  regular rate and rhythm, S1, S2 without murmurs, rubs, gallops, or clicks Abdomen:  bowel sounds positive; abdomen soft and non-tender without masses, or organomegaly Msk:  no deformity or scoliosis noted with normal posture Pulses:  pulses normal Extremities:  no clubbing, cyanosis, edema, or deformity noted. Dry skin in dorsum of hand as before Neurologic:  CN II-XII grossly intact with normal reflexes, coordination, muscle strength and tone Skin:  intact without lesions or rashes Cervical Nodes:  no significant adenopathy Axillary Nodes:  no significant adenopathy Psych:  alert and cooperative; normal mood and affect; normal attention span and concentration   CT of Chest  Procedure date:  05/30/2010  Findings:      reviwed in HPI.  Impression & Recommendations:  Problem # 1:   PULMONARY NODULE, SOLITARY (ICD-518.89) Assessment Deteriorated LUL apical nodule was stable from August 2009  -> Sept 2010 but has grown since then to [facility] 2011 but he had infectious symptoms at time of scan. Currently 8-68mm.   plan get pet scan 6 weeks from 05/30/2010 will discuss at [facility] as well  Orders: Radiology Referral (Radiology) Est. Patient Level IV (54098) Prescription Created Electronically [phone])  Problem # 2:  C O P D (ICD-496) Assessment: Unchanged stable disease  plan continue spiriva - mdi tech reinforced refer [facility] study - give flu shot recheck full PFT at next visit  Problem # 3:  BRONCHOPNEUMONIA ORGANISM UNSPECIFIED (ICD-485) Assessment: New  s/p RUL ante segment PNA on 05/30/2010 on CT chest. Symptomatic at that time. s/p abx Rx as outpatient by PMD. Now well  plan repeat ct in6 weeks from 05/30/2010  Orders: Est. Patient Level IV (78295) Prescription Created Electronically [phone])  Medications Added to Medication List This Visit: 1)  Proair Hfa 108 (90 Base) Mcg/act Aers (Albuterol sulfate) .[name].. 1-2 puffs  as needed  Other Orders: T- * Misc. Laboratory test [phone]) HFA Instruction [phone])  Patient Instructions: 1)  #COPD 2)   - your copd is stable 3)   = take some spiriva samples and show your tech to my nurse 4)  - use pro-air 2 puff as needed - show yoru tech to my nurse 5)   - will check alpha 1 gene for copd 6)    - have flu shot today 7)   - i will have research coordinator for [facility] STUDY contact you 8)  #PNEUMONIA 9)   - you had pneumonia Rt lung 05/30/2010 10)   - glad you are better 11)  #LUNG NODULE 12)   - the right lung nodule in backpart is scar - not to worry 13)   - left lung upper part nodule has been there for 2 years but has grown 14)   - HAVE PET CT SCAN in 2 months (changed to 6 weeks) 15)  #FOLLOWUP 16)   - after PET CT in 6 weeks  Prescriptions: PROAIR HFA 108 (90 BASE) MCG/ACT  AERS (ALBUTEROL SULFATE) 1-2 puffs   as needed  #1 x 6   Entered and Authorized by:   Kalman Shan MD   Signed by:   Kalman Shan MD on 06/12/2010   Method  used:   Print then Give to Patient   RxID:   [id number] SPIRIVA HANDIHALER 18 MCG CAPS (TIOTROPIUM BROMIDE MONOHYDRATE) once daily  #1 x 6   Entered and Authorized by:   Kalman Shan MD   Signed by:   Kalman Shan MD on 06/12/2010   Method used:   Print then Give to Patient   RxID:   [id number]    Immunization History:  Influenza Immunization History:    Influenza:  fluvax 3+ (06/21/2009)  Pneumovax Immunization History:    Pneumovax:  pneumovax (05/22/2008)   Appended Document: discuss ct results//jrc [name], I changed my mind on PET CT date. It should be 6 weeks from 05/30/2010 and not 2 months from today. Please coordinate for the new revised date of PET CT. Please let patient know. If he has any questions, I can call him  Jiro Kiester  Appended Document: discuss ct results//jrc [id number]  Appended Document: PET order pt aware of change to have PET CT in 9 weeks from 05-30-10. order placed. Carron Curie CMA  June 16, 2010 5:10 PM    Clinical Lists Changes  Orders: Added new Referral order of Radiology Referral (Radiology) - Signed

## 2010-10-21 NOTE — Consult Note (Addendum)
NAMEJORDI, [name] NO.:  [id number]  MEDICAL RECORD NO.:  [id number]          PATIENT TYPE:  INP  LOCATION:  5041                         FACILITY:  [facility]  PHYSICIAN:  Coralyn Helling, MD        DATE OF BIRTH:  Mar 26, 1951  DATE OF CONSULTATION:  10/20/2010 DATE OF DISCHARGE:                                CONSULTATION   REFERRING PHYSICIAN:  [employer] Environmental education officer.  PRIMARY CARE PHYSICIAN:  Dr. Verl Dicker.  PULMONOLOGIST:  Kalman Shan, MD  REASON FOR CONSULTATION:  Recurrent pneumonia.  CHIEF COMPLAINT:  Fever.  HISTORY OF PRESENT ILLNESS:  This is a 60 year old male with a past medical history of GOLD stage III COPD and non-small cell lung cancer status post resection on April 04, 2010 of the left upper lobe, also has a history of hypertension with normal cardiac cath in 2009.  He was recently admitted with pneumonia on the right on January 6 to September 29, 2010.  He is seen in the office on October 09, 2010 and felt great.  On Sunday, he woke up at 3:00 in the morning acutely short of breath, febrile, and chills and his temperature was noted to be 103 in the emergency room.  He also had an episode of nausea and vomiting.  He was noted to have hypoxemia in the emergency room and chest x-ray and CT scan showed new right-sided infiltrate.  He notes having problems with his swallowing of both food and liquids.  Since being admitted, he does feel somewhat better.  He had evaluation earlier today by Speech Therapy and is scheduled to have a modified barium swallow to further assess his swallowing difficulties.  PAST MEDICAL HISTORY: 1. Non-small-cell lung cancer status post left upper lobe wedge     resection in December 2011. 2. GOLD stage III COPD. 3. Streptococcus pneumonia in 2009. 4. Polycythemia vera. 5. Hypertension. 6. Vitiligo. 7. Paroxysmal atrial fibrillation. 8. Hypothyroidism. 9. Status post appendectomy. 10.Back  surgery. 11.Situational depression. 12.Erectile dysfunction. 13.Diastolic dysfunction.  ALLERGIES:  PAXIL which causes him to have hallucinations.  OUTPATIENT MEDICATIONS: 1. Aspirin 81 mg daily. 2. Spiriva 1 puff daily. 3. Bisoprolol/hydrochlorothiazide 2.5/6.25 kg 1 tablet daily. 4. Synthroid 300 mcg daily. 5. Percocet 5/325 mg q.4 h. p.r.n. 6. Viagra p.r.n. 7. Gout medicine p.r.n.  FAMILY HISTORY:  Significant for his mother who had arthritis and his father who had heart failure.  SOCIAL HISTORY:  He lives in [place].  He is married.  He has one child and one granddaughter.  He works as a Naval architect.  He quit smoking in 2003 and smoked 1-3 pack of cigarettes per day for 45 years. He denies recent significant alcohol use.  REVIEW OF SYSTEMS:  He complains of fever, shortness of breath, cough, nausea, and vomiting.  Remainder of 12-point review of systems is negative.  PHYSICAL EXAMINATION:  GENERAL:  He is seen in his hospital room.  He is awake, alert, sitting in his hospital bed and does not appear to be in acute distress. VITAL SIGNS:  Temperature is 98.3, blood pressure 98/56, heart rate is 102,  respiratory rate is 16, and oxygen saturation is 92% on 2 L. HEENT:  Pupils are reactive.  Extraocular movements are intact.  There is no sinus tenderness.  No nasal discharge.  He has poor oral dentition.  There is no oral exudate. NECK:  There is no lymphadenopathy.  No thyromegaly. HEART:  S1-S2.  No murmur. CHEST:  He has coarse breath sounds, more prominent on the right side but there is no wheezing. ABDOMEN:  Soft and nontender.  Positive bowel sounds.  No organomegaly. EXTREMITIES:  There is no edema, cyanosis, or clubbing. NEUROLOGIC:  He had normal strength.  Cranial nerves II-XII are intact and no focal deficits are appreciated. SKIN:  He has lost pigment on both hands and chronic venous stasis changes in his lower extremities.  CT scan of the chest from  October 19, 2010 was negative for pulmonary embolism which showed patchy areas of infiltrate in the right lung with right pleural calcification.  Sodium is 138, potassium is 3.9, chloride is 100, CO2 is 26, BUN is 16, creatinine is 1.23, and glucose is 111. Hemoglobin is 11.8, hematocrit is 35.1, platelet count is 190, and WBC is 12.3.  TSH is 0.012.  Procalcitonin is 17.58.  Lactic acid is 2.  D- dimer is 1.09.  Blood cultures are pending at this time.  IMPRESSION:  This is a 60 year old male with history of GOLD stage III chronic obstructive pulmonary disease and non-small cell lung cancer status post left upper lobe wedge resection, now presenting with recurrent pneumonia.  He does report having difficulties with swallowing and he did have some abnormalities in his bedside swallow evaluation by Speech Therapy earlier today.  It is possible that he could be having recurrent aspiration.  I would continue him on his current antibiotic for intubation as per the Hospitalist Team.  He is scheduled to have a modified barium swallow later today with Speech Therapy and depending upon the results of that, we will likely need to have a modification in his diet.  Otherwise, he seems to be well compensated with his chronic obstructive pulmonary disease and I will continue him on his current inhaler regimen.     Coralyn Helling, MD     VS/MEDQ  D:  10/20/2010  T:  10/21/2010  Job:  [id number]  Electronically Signed by Coralyn Helling MD on 10/21/2010 10:41:33 AM

## 2010-10-21 NOTE — Progress Notes (Signed)
Summary: PET results > to discuss at 11.17.11 ov w/ MR  Phone Note Call from Patient   Caller: Patient Call For: ramaswamy Summary of Call: pt wanted to know what step needs to be taken (since nurse or mr had given pt PET results). I offered to make him a f/u ov but pt wanted to talk to nurse. pt # [phone] Initial call taken by: Tivis Ringer, CNA,  July 24, 2010 4:21 PM  Follow-up for Phone Call        per append to PET scan, pt needs to come in to discuss.  called spoke with patient, advised that MR recommends an ov to discuss the results.  pt fine with this.  appt scheduled for 11.17.11 @ 1630.  pt okay with this date and time. Boone Master CNA/MA  July 24, 2010 4:52 PM

## 2010-10-21 NOTE — Progress Notes (Signed)
Summary: [facility] appt ?  Phone Note Outgoing Call   Summary of Call: [name], a) when is his appt with Dr. [name] and if has seen [name] what was he told; b) PFTs shows COPD but appers acceptable for surgery. Dr. [name] needs to look at this; c)  yplease let me know if surgery is being planned so I can order some QVAR for him - mild BD response on PFt Initial call taken by: Kalman Shan MD,  August 27, 2010 1:19 PM  Follow-up for Phone Call        Spoke with pt and he saw Dr. [name] and the surgery is set for 09-04-10. Carron Curie CMA  August 29, 2010 5:09 PM  Follow-up by: Kalman Shan MD,  August 29, 2010 5:32 PM  Additional Follow-up for Phone Call Additional follow up Details #1::        ok thanks. Additional Follow-up by: Kalman Shan MD,  August 29, 2010 5:33 PM

## 2010-10-21 NOTE — Progress Notes (Signed)
Summary: return call  Phone Note Call from Patient Call back at Work Phone [phone]   Caller: Patient Call For: Neera Teng Reason for Call: Talk to Nurse Summary of Call: pt returned call to [facility] or [place]. Initial call taken by: Eugene Gavia,  June 06, 2010 1:40 PM  Follow-up for Phone Call        pt advised per append of ct results and scheduled to see MR on 06-12-10 at 1:30. Carron Curie CMA  June 06, 2010 2:38 PM

## 2010-10-21 NOTE — Assessment & Plan Note (Signed)
Summary: ov to discuss PET results///JJ   Visit Type:  Follow-up Primary Provider/Referring Provider:  Dr. Arrie Aran in [place]  CC:  Pt here to discuss PET scan results.  History of Present Illness: BRIEF: EX-smoker, EX-etoh.  [facility] patient since August 2009 when admitted to ICU for  Strep PNA bilateral bibaasl consolidation neeeding APRV. Gold STage 2 COPD MM genotype and LUL apical pulm nodule (since august 2009), RUL post segment post pneumonic infiltrate (since oct 2009).   OV 06/12/2010: [facility] for [employer]  Last ct scan in sept 2010 showed stability. A repeat ct scan was done 05/30/2010 and he is here for fu. AT time of CT scan he had fever and AECOPD , pneumonia symptims. HE ws treated by PMD with antibiptic and currently feels back to baseline. But CT does show new RUL anterior segment pneumonic infitlrates c/w infectious symptoms. The RUL posterior segment infiltrate is stable/decreased  now since Oct 2009 and therefore represents a scar. However, the LUL apical nodule appears to have grown to 8-68mm.  In terms of COPD, there are no complaints. Using spiriva regularly. 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 Weight stable. Agrees for flu shot.  REC: PET SCAN   August 07, 2010: Followup for enlarging LUL nodule and COPD. Since last visit he had PET Scan on 07/11/2010. PET shows LUL nodule with SUV 3.2. He is here to followup and discuss next step in mgmt of the nodule. No interim complaints. . Using spiriva regularly. 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 Weight stable. He is lookng for direction from me regarding step in lung nodule (LUL mgmt)     Preventive Screening-Counseling & Management  Alcohol-Tobacco     Smoking Status: quit     Year Quit: 2002     Pack years: 42 yrs 21/2 yrs     Tobacco Counseling: not to resume use of tobacco products  Current Medications (verified): 1)  Adprin B 325 Mg Tabs (Aspirin  Buf(Cacarb-Mgcarb-Mgo)) .... Once Daily 2)  Synthroid 125 Mcg Tabs (Levothyroxine Sodium) .... Take 1 Tablet By Mouth Once A Day 3)  Spiriva Handihaler 18 Mcg Caps (Tiotropium Bromide Monohydrate) .... Once Daily 4)  Bisoprolol-Hydrochlorothiazide 2.5-6.25 Mg Tabs (Bisoprolol-Hydrochlorothiazide) .... Once Daily  Allergies (verified): No Known Drug Allergies  Past History:  Past medical, surgical, family and social histories (including risk factors) reviewed, and no changes noted (except as noted below).  Past Medical History: #ICU Illness - August 2009   - Strep PNA.  Aug 11-30, 2009. Needing APRV. Cleared on CT chest Oct 2009   -  Congestive heart failure with diastolic dysfunction. Normal coronaries on cath 05/04/2008   -  Gastrointestinal obstruction ileus.   - Icu agitation/delirium  - Pulm Htn 05/04/2008 - PCWP 17. PA 56/32, CO 9-10L/min.  placed on expectant followup  #. Polycythemia.Marland KitchenMarland KitchenDr Shirline Frees    - diagnpsed 04/2008  -  Phlebotomy under consideration   #. Hypothyrodisim  #GOLD STAGE 2 COPD..........................Marland KitchenRamaswamy    Cleda Daub 12/03/2008 - FEv1 2.4L/70%. - Rx spiriva  -  MM genotype Sept 2011  #Obesity    - intentional weight loss after critical illness  #PULMONARY NODULE - LUL apical nodule  - 04/2008 - 6mm   - 08/2008 - 6-7 mm ; no chnage  - 05/2009 - no chnage - 05/2010: 1.2 cm.  growth +    - 07/2010: PET SCAN SUV 3.2  #RUL Posterior segment consolidative process...  - New Oct 2009  -  Dec 2009: Mild reduction  - March 2010 - Stable compared to dec 2009  - Sept 2010 - further reduction  - SEpt 2011 - stable -> No further followup  #RUL Anterior segment pneumonic process  - Sept 2011: New on CT. Aecopd symptoms +  - Nov 2011: FU CT pendnig  Past Surgical History: Reviewed history from 06/05/2008 and no changes required. appendectomy and some type of back surgery  RT and LHC - 05/04/2008 1. Right atrial pressure 15.   2. RV 56/40.   3. Pulmonary  artery 56/32.   4. Pulmonary capillary wedge 17.   5. Aortic 118/18.   6. LV 120/82, mean 99.   7. No gradient on pullback across aortic valve.   8. No LV pulmonary capillary wedge gradient.   9. Superior vena cava saturation 75%.   10.Pulmonary artery saturation 78%.  12. Normal Left heart cath   11.Aortic saturation 95%.   12.Fick cardiac output 10.9 L/min.   13.Thermodilution cardiac output 8.9 L  Family History: Reviewed history from 06/05/2008 and no changes required. heart disease-father Mother is alive and well at the age of 25.  His father   died in his 45s with some type of heart problems.  He has three sisters   that are living and wife feels that one of his sister has some type of   lung problem.   Social History: Reviewed history from 06/05/2008 and no changes required. The patient resides in [place] with his wife.  They   have one daughter and one granddaughter.  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  He has not   smoked in 7 years.  Prior to that, he was smoking at least three packs   per day for unknown duration.  The patient's wife states that he has had   a history of heavy alcohol use, but denies actually alcoholism or   problems with withdrawal. He drinks approximately a six-pack   a day but quit 04/2008 after admission for PNA.  She denies any known drug or herbal medication use.  The patient did  not exercise nor does he follow a specific diet bu after 04/2008 admission for pna; exercises daily and is following a diet  Review of Systems  The patient denies anorexia, fever, weight loss, weight gain, vision loss, decreased hearing, hoarseness, chest pain, syncope, dyspnea on exertion, peripheral edema, prolonged cough, headaches, hemoptysis, abdominal pain, melena, hematochezia, severe indigestion/heartburn, hematuria, incontinence, genital sores, muscle weakness, suspicious skin lesions, transient blindness,  difficulty walking, depression, unusual weight change, abnormal bleeding, enlarged lymph nodes, angioedema, breast masses, and testicular masses.    Vital Signs:  Patient profile:   60 year old male Height:      72 inches Weight:      232.50 pounds BMI:     31.65 O2 Sat:      96 % on Room air Temp:     97.9 degrees F oral Pulse rate:   49 / minute BP sitting:   130 / 80  (right arm) Cuff size:   regular  Vitals Entered By: Carron Curie CMA (August 07, 2010 4:25 PM)  O2 Flow:  Room air CC: Pt here to discuss PET scan results Comments Medications reviewed with patient Carron Curie CMA  August 07, 2010 4:27 PM Daytime phone number verified with patient.    Physical Exam  General:  overweight.   Head:  normocephalic and atraumatic  Eyes:  PERRLA/EOM intact; conjunctiva and sclera clear Ears:  TMs intact and clear with normal canals Nose:  no deformity, discharge, inflammation, or lesions Mouth:  no deformity or lesions Neck:  no masses, thyromegaly, or abnormal cervical nodes Chest Wall:  no deformities noted Lungs:  clear bilaterally to auscultation and percussion Heart:  regular rate and rhythm, S1, S2 without murmurs, rubs, gallops, or clicks Abdomen:  bowel sounds positive; abdomen soft and non-tender without masses, or organomegaly Msk:  no deformity or scoliosis noted with normal posture Pulses:  pulses normal Extremities:  no clubbing, cyanosis, edema, or deformity noted. Dry skin in dorsum of hand as before Neurologic:  CN II-XII grossly intact with normal reflexes, coordination, muscle strength and tone Skin:  intact without lesions or rashes Cervical Nodes:  no significant adenopathy Axillary Nodes:  no significant adenopathy Psych:  alert and cooperative; normal mood and affect; normal attention span and concentration   Impression & Recommendations:  Problem # 1:  BRONCHOPNEUMONIA ORGANISM UNSPECIFIED (ICD-485) Assessment Improved  s/p RUL ante  segment PNA on 05/30/2010 on CT chest. Symptomatic at that time. s/p abx Rx as outpatient by PMD. Now well.  This has resolved on PET SCan 07/11/2010 and no metabolic activity  plan no further followup  Orders: Est. Patient Level IV (95638)  Problem # 2:  PULMONARY NODULE, SOLITARY (ICD-518.89) Assessment: Deteriorated  LUL apical nodule was stable from August 2009  -> Sept 2010 but has grown since then to [facility] 2011 . PET scan Oct 2011 shows SUV 3.2  plan detailed discussion. He is low-moderate operative risk for lobectomy but could undergo unneccessary resection if this ends up not being lung cancer. However, he has risk factors and fact is nodule has enlarged and has metabolic activity on PET. He and wife state that uncertainty about followup scans are not acceptable to them. He is willing to accept risks of surgery. Understands that if cancer surgery is curative. Therefore, will set up appt with Dr [name] for evaluation. Wedge bx first and if malignant proceed to lobectomy. Do full PFTs bfore seeing Dr. [name]  Problem # 3:  C O P D (ICD-496) Assessment: Unchanged stable  plan contnue spiriva  Medications Added to Medication List This Visit: 1)  Synthroid 125 Mcg Tabs (Levothyroxine sodium) .... Take 1 tablet by mouth once a day  Other Orders: Surgical Referral (Surgery) Pulmonary Referral (Pulmonary)  Patient Instructions: 1)  will walk you for oxygen levels 2)  have [facility] [place] [facility] [place] 3)  see Dr. [name] ASAP 4)  PFt needs to be completed before seeing Dr. [name] 5)  return in 2 months or sooner

## 2010-10-22 DIAGNOSIS — J189 Pneumonia, unspecified organism: Secondary | ICD-10-CM

## 2010-10-22 DIAGNOSIS — J449 Chronic obstructive pulmonary disease, unspecified: Secondary | ICD-10-CM

## 2010-10-22 DIAGNOSIS — R1310 Dysphagia, unspecified: Secondary | ICD-10-CM

## 2010-10-22 LAB — CBC
MCHC: 31.4 g/dL (ref 30.0–36.0)
Platelets: 213 10*3/uL (ref 150–400)
RDW: 13.2 % (ref 11.5–15.5)

## 2010-10-22 LAB — EXPECTORATED SPUTUM ASSESSMENT W GRAM STAIN, RFLX TO RESP C

## 2010-10-23 LAB — COMPREHENSIVE METABOLIC PANEL
CO2: 29 mEq/L (ref 19–32)
Calcium: 9.1 mg/dL (ref 8.4–10.5)
Creatinine, Ser: 1 mg/dL (ref 0.4–1.5)
GFR calc non Af Amer: >60 mL/min (ref >60)
Glucose, Bld: 160 mg/dL — ABNORMAL HIGH (ref 70–99)

## 2010-10-23 NOTE — Assessment & Plan Note (Signed)
Summary: HFU PER [facility]E-[facility] PER JC //KP   Visit Type:  Follow-up Primary Provider/Referring Provider:  Dr. Arrie Aran in [place]  CC:  HFU.  Pt denies any complaints. .  History of Present Illness: BRIEF: EX-smoker, EX-etoh.  [facility] patient since August 2009 when admitted to ICU for  Strep PNA bilateral bibaasl consolidation neeeding APRV. Gold STage 2 COPD MM genotype.  s/p LUL wedge resection for stg Ia squamous cell CA [name] ) in dec '11    October 09, 2010: Admotted 09/26/2010 through 09/29/2010 with RML and RLL pneumonia. Now presenting for hospital followup. Feeling well. No complaints other than mild deconditioning. He feels like he needs an exercise program. Denies dyspnea, cough, wheeze, sputum, edema, hemoptysis. Due to see Dr. [name] in few days. Had CXR 10/07/2010 that shows clearing of RML and RLL pneumonia.     Preventive Screening-Counseling & Management  Alcohol-Tobacco     Smoking Status: quit     Year Quit: 2002     Pack years: 42 yrs 21/2 yrs     Tobacco Counseling: not to resume use of tobacco products  Current Medications (verified): 1)  Adprin B 325 Mg Tabs (Aspirin Buf(Cacarb-Mgcarb-Mgo)) .... Once Daily 2)  Synthroid 125 Mcg Tabs (Levothyroxine Sodium) .... Take 1 Tablet By Mouth Once A Day 3)  Spiriva Handihaler 18 Mcg Caps (Tiotropium Bromide Monohydrate) .... Once Daily 4)  Bisoprolol-Hydrochlorothiazide 2.5-6.25 Mg Tabs (Bisoprolol-Hydrochlorothiazide) .... Once Daily  Allergies (verified): No Known Drug Allergies  Past History:  Past medical, surgical, family and social histories (including risk factors) reviewed, and no changes noted (except as noted below).  Past Medical History: #ICU Illness - August 2009   - Strep PNA.  Aug 11-30, 2009. Needing APRV. Cleared on CT chest Oct 2009   -  Congestive heart failure with diastolic dysfunction. Normal coronaries on cath 05/04/2008   -  Gastrointestinal obstruction ileus.   - Icu  agitation/delirium  - Pulm Htn 05/04/2008 - PCWP 17. PA 56/32, CO 9-10L/min.  placed on expectant followup #. Polycythemia.Marland KitchenMarland KitchenDr Shirline Frees    - diagnpsed 04/2008  -  Phlebotomy under consideration  #. Hypothyrodisim #GOLD STAGE 2 COPD..........................Marland KitchenRamaswamy    Cleda Daub 12/03/2008 - FEv1 2.4L/70%. - Rx spiriva  -  MM genotype Sept 2011 #Obesity    - intentional weight loss after critical illness #RUL Posterior segment consolidative process...  - New Oct 2009  - Dec 2009: Mild reduction  - March 2010 - Stable compared to dec 2009  - Sept 2010 - further reduction  - SEpt 2011 - stable  - Oct 2011 - resolved on PET CT and no active uptake -> No further followup #RUL Anterior segment pneumonic process  - Sept 2011: New on CT. Aecopd symptoms +  - Oct 2011: Cleared  up #LUL Lung Cancer  - s/p wedge resection 09/04/2010  Past Surgical History: Reviewed history from 06/05/2008 and no changes required. appendectomy and some type of back surgery  RT and LHC - 05/04/2008 1. Right atrial pressure 15.   2. RV 56/40.   3. Pulmonary artery 56/32.   4. Pulmonary capillary wedge 17.   5. Aortic 118/18.   6. LV 120/82, mean 99.   7. No gradient on pullback across aortic valve.   8. No LV pulmonary capillary wedge gradient.   9. Superior vena cava saturation 75%.   10.Pulmonary artery saturation 78%.  12. Normal Left heart cath   11.Aortic saturation 95%.   12.Fick cardiac output 10.9 L/min.  13.Thermodilution cardiac output 8.9 L  Family History: Reviewed history from 06/05/2008 and no changes required. heart disease-father Mother is alive and well at the age of 45.  His father   died in his 84s with some type of heart problems.  He has three sisters   that are living and wife feels that one of his sister has some type of   lung problem.   Social History: Reviewed history from 06/05/2008 and no changes required. The patient resides in [place] with his wife.  They   have  one daughter and one granddaughter.  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  He has not   smoked in 7 years.  Prior to that, he was smoking at least three packs   per day for unknown duration.  The patient's wife states that he has had   a history of heavy alcohol use, but denies actually alcoholism or   problems with withdrawal. He drinks approximately a six-pack   a day but quit 04/2008 after admission for PNA.  She denies any known drug or herbal medication use.  The patient did  not exercise nor does he follow a specific diet bu after 04/2008 admission for pna; exercises daily and is following a diet  Review of Systems  The patient denies anorexia, fever, weight loss, weight gain, vision loss, decreased hearing, hoarseness, chest pain, syncope, dyspnea on exertion, peripheral edema, prolonged cough, headaches, hemoptysis, abdominal pain, melena, hematochezia, severe indigestion/heartburn, hematuria, incontinence, genital sores, muscle weakness, suspicious skin lesions, transient blindness, difficulty walking, depression, unusual weight change, abnormal bleeding, enlarged lymph nodes, angioedema, breast masses, and testicular masses.    Vital Signs:  Patient profile:   60 year old male Height:      72 inches Weight:      232.38 pounds BMI:     31.63 O2 Sat:      98 % on Room air Pulse rate:   50 / minute BP sitting:   130 / 84  (right arm) Cuff size:   regular  Vitals Entered By: Carron Curie CMA (October 09, 2010 4:22 PM)  O2 Flow:  Room air  Serial Vital Signs/Assessments:  Comments: Ambulatory Pulse Oximetry  Resting; HR__56___    02 Sat_98%RA____  Lap1 (185 feet)   HR__72___   02 Sat__94%RA___ Lap2 (185 feet)   HR__75___   02 Sat__93%RA___    Lap3 (185 feet)   HR__71___   02 Sat__92%RA___  _x__Test Completed without Difficulty Zackery Barefoot CMA  October 09, 2010 5:07 PM  ___Test Stopped due to:   By: Zackery Barefoot  CMA   CC: HFU.  Pt denies any complaints.  Comments Medications reviewed with patient s.ign Daytime phone number verified with patient.    Physical Exam  General:  overweight.   Head:  normocephalic and atraumatic Eyes:  PERRLA/EOM intact; conjunctiva and sclera clear Ears:  TMs intact and clear with normal canals Nose:  no deformity, discharge, inflammation, or lesions Mouth:  no deformity or lesions Neck:  no masses, thyromegaly, or abnormal cervical nodes Chest Wall:  no deformities noted Lungs:  clear bilaterally to auscultation and percussion Heart:  regular rate and rhythm, S1, S2 without murmurs, rubs, gallops, or clicks Abdomen:  bowel sounds positive; abdomen soft and non-tender without masses, or organomegaly Msk:  no deformity or scoliosis noted with normal posture Pulses:  pulses normal Extremities:  no clubbing, cyanosis, edema, or deformity noted. Dry skin  in dorsum of hand as before Neurologic:  CN II-XII grossly intact with normal reflexes, coordination, muscle strength and tone Skin:  intact without lesions or rashes Cervical Nodes:  no significant adenopathy Axillary Nodes:  no significant adenopathy Psych:  alert and cooperative; normal mood and affect; normal attention span and concentration   CXR  Procedure date:  10/07/2010  Findings:      clearance of pneumonia  Comments:      independently reviewed  Impression & Recommendations:  Problem # 1:  CARCINOMA, LUNG, SQUAMOUS CELL (ICD-162.9)  stg IA - LUL s/p resection in dec '11   plan needs annual ct chest for life -  I have informed him and wife of this Currentlu Dr [name] doing surveillance  Problem # 2:  BRONCHOPNEUMONIA ORGANISM UNSPECIFIED (ICD-485) Assessment: Improved  RML and RLL pna early Jan 2012. Cleared n CXR 10/07/2010.   plan no further cxr if recurs again on right then plan bronch  Orders: Est. Patient Level III (16109)  Problem # 3:  C O P D (ICD-496) Assessment:  Unchanged sable. no exertional desa with walking 185 feet x 3 laps on 10/09/2010  plan conitnue inhalers recommend pulmonary rehab rov 6 months  Other Orders: Rehabilitation Referral (Rehab)  Patient Instructions: 1)  glad you are better 2)  please keep appointment with Dr [name] 3)  return to see me in 6 months 4)  I am referring you to rehab at [facility] 5)  come sooner or call if any problems

## 2010-10-23 NOTE — Progress Notes (Signed)
Summary: appt quest  Phone Note Call from Patient   Caller: wifeErskine Squibb [phone] Call For: [facility] Summary of Call: pt was admitted to the hosp today should they cancel appt for monday Initial call taken by: Lacinda Axon,  September 26, 2010 3:33 PM  Follow-up for Phone Call        pt is in the hospital and had an appt set for monday 09-29-09. I have cancelled this appt and advised they will get f/u instructions at discharge and to call and set hfu at that time.Carron Curie CMA  September 26, 2010 4:23 PM

## 2010-10-23 NOTE — Assessment & Plan Note (Signed)
Summary: pneumonia- hosp admission   Vital Signs:  Patient profile:   60 year old male O2 Sat:      91 % on Room air Temp:     100.3 degrees F oral Pulse rate:   88 / minute Pulse rhythm:   regular Resp:     22 per minute BP supine:   114 / 57  O2 Flow:  Room air  Primary Provider/Referring Provider:  Dr. Arrie Aran in [place]   History of Present Illness: BRIEF: EX-smoker, EX-etoh.  [facility] patient since August 2009 when admitted to ICU for  Strep PNA bilateral bibaasl consolidation neeeding APRV. Gold STage 2 COPD MM genotype and LUL apical pulm nodule (since august 2009), RUL post segment post pneumonic infiltrate (since oct 2009). s/p LUL wedge resection for stg Ia squamous cell CA [name] ) in dec '11  September 26, 2010 11:00 AM post hosp FU CXR on 12/28 showed resolving lt apical pnthx Came to ER today with sudden onset fever, blood staiuned sputum, < 1 tsp & dyspnea, CXR - patchy ASD in RML & RLL, being admitted under Dr [name], [facility] 16.9k, ceftx in ED     Allergies: No Known Drug Allergies  Past History:  Past Surgical History: Last updated: 06/05/2008 appendectomy and some type of back surgery  RT and LHC - 05/04/2008 1. Right atrial pressure 15.   2. RV 56/40.   3. Pulmonary artery 56/32.   4. Pulmonary capillary wedge 17.   5. Aortic 118/18.   6. LV 120/82, mean 99.   7. No gradient on pullback across aortic valve.   8. No LV pulmonary capillary wedge gradient.   9. Superior vena cava saturation 75%.   10.Pulmonary artery saturation 78%.  12. Normal Left heart cath   11.Aortic saturation 95%.   12.Fick cardiac output 10.9 L/min.   13.Thermodilution cardiac output 8.9 L  Family History: Last updated: 06/05/2008 heart disease-father Mother is alive and well at the age of 47.  His father   died in his 40s with some type of heart problems.  He has three sisters   that are living and wife feels that one of his sister has some type of   lung  problem.   Social History: Last updated: 06/05/2008 The patient resides in [place] with his wife.  They   have one daughter and one granddaughter.  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  He has not   smoked in 7 years.  Prior to that, he was smoking at least three packs   per day for unknown duration.  The patient's wife states that he has had   a history of heavy alcohol use, but denies actually alcoholism or   problems with withdrawal. He drinks approximately a six-pack   a day but quit 04/2008 after admission for PNA.  She denies any known drug or herbal medication use.  The patient did  not exercise nor does he follow a specific diet bu after 04/2008 admission for pna; exercises daily and is following a diet  Past Medical History: #ICU Illness - August 2009   - Strep PNA.  Aug 11-30, 2009. Needing APRV. Cleared on CT chest Oct 2009   -  Congestive heart failure with diastolic dysfunction. Normal coronaries on cath 05/04/2008   -  Gastrointestinal obstruction ileus.   - Icu agitation/delirium  - Pulm Htn 05/04/2008 - PCWP 17. PA 56/32, CO  9-10L/min.  placed on expectant followup #. Polycythemia.Marland KitchenMarland KitchenDr Shirline Frees    - diagnpsed 04/2008  -  Phlebotomy under consideration  #. Hypothyrodisim #GOLD STAGE 2 COPD..........................Marland KitchenRamaswamy    Cleda Daub 12/03/2008 - FEv1 2.4L/70%. - Rx spiriva  -  MM genotype Sept 2011 #Obesity    - intentional weight loss after critical illness #RUL Posterior segment consolidative process...  - New Oct 2009  - Dec 2009: Mild reduction  - March 2010 - Stable compared to dec 2009  - Sept 2010 - further reduction  - SEpt 2011 - stable -> No further followup #RUL Anterior segment pneumonic process  - Sept 2011: New on CT. Aecopd symptoms +  - Nov 2011: FU CT pendnig Lung Cancer  Review of Systems       The patient complains of shortness of breath with activity, coughing up blood, and fever.  The patient  denies shortness of breath at rest, productive cough, non-productive cough, chest pain, irregular heartbeats, acid heartburn, indigestion, loss of appetite, weight change, abdominal pain, difficulty swallowing, sore throat, tooth/dental problems, headaches, nasal congestion/difficulty breathing through nose, sneezing, itching, ear ache, anxiety, depression, hand/feet swelling, joint stiffness or pain, rash, and change in color of mucus.    Physical Exam  Additional Exam:  Gen. Pleasant, well-nourished, in no distress, normal affect ENT - no lesions, no post nasal drip Neck: No JVD, no thyromegaly, no carotid bruits Lungs: no use of accessory muscles, no dullness to percussion,decreased, no rales or rhonchi  Cardiovascular: Rhythm regular, heart sounds  normal, no murmurs or gallops, no peripheral edema Abdomen: soft and non-tender, no hepatosplenomegaly, BS normal. Musculoskeletal: No deformities, no cyanosis or clubbing Neuro:  alert, non focal     Impression & Recommendations:  Problem # 1:  BRONCHOPNEUMONIA ORGANISM UNSPECIFIED (ICD-485) received ceftx in ED Due to recent hosp admission, agree with broad spectrum zosyn/ vanc Can narrow down in 48 h once cx obtained  Problem # 2:  C O P D (ICD-496) ct spiriva O2 satn 92% acceptable  Problem # 3:  CARCINOMA, LUNG, SQUAMOUS CELL (ICD-162.9) Assessment: Comment Only stg IA s/p resection in dec '11

## 2010-10-24 LAB — CULTURE, RESPIRATORY W GRAM STAIN
Culture: NORMAL
Gram Stain: NONE SEEN

## 2010-10-25 LAB — CULTURE, BLOOD (ROUTINE X 2)
Culture  Setup Time: 201201292118
Culture: NO GROWTH

## 2010-10-28 NOTE — Discharge Summary (Signed)
[name], [name]                ACCOUNT NO.:  [id number]  MEDICAL RECORD NO.:  [id number]           PATIENT TYPE:  I  LOCATION:  5041                         FACILITY:  [facility]  PHYSICIAN:  Pincus Large, MD     DATE OF BIRTH:  1951/02/24  DATE OF ADMISSION:  10/19/2010 DATE OF DISCHARGE:  10/24/2010                              DISCHARGE SUMMARY   PRIMARY CARE PHYSICIAN:  [name], M.D.  CARDIOVASCULAR SURGEON:  [name], M.D.  PULMONOLOGIST:  [place] Shan, MD.  REASON FOR ADMISSION:  Shortness of breath and fever.  DISCHARGE DIAGNOSES: 1. Pneumonia. 2. Mild chronic bilateral anterior ethmoid sinusitis. 3. History of squamous cell lung cancer stage IA, status post left     upper lobe wedge resection. 4. Chronic obstructive pulmonary disease, Gold stage III. 5. Hypertension. 6. Hypothyroid. 7. Leukocytosis, resolved.  MEDICATIONS ON DISCHARGE: 1. Albuterol 2.5 q.2 h. P.r.n. 2. Augmentin 875 p.o. b.i.d. 3. Fluticasone 50 mcg 1 spray b.i.d. 4. Percocet 1 tablet q.4 p.r.n. 5. Protonix 40 mg daily. 6. Aspirin 81 mg daily. 7. Bisoprolol/hydrochlorothiazide 2.5/6.25 daily. 8. Indomethacin p.r.n. as needed. 9. Levothyroxine 300 mcg daily. 10.Viagra 100 mg half tablet as needed.  IMAGING:  Chest x-ray was done on October 19, 2010, it shows recurrent right middle lobe and lower lobe infiltrate.  CT angio was done on January 29, which shows diffuse patchy consolidation airspace, opacities throughout the right lung; likely represent pneumonia.  CT of the sinus was done on January 31, which shows mild chronic bilateral anterior ethmoid sinusitis.  HOSPITAL COURSE:  This is a 60 year old male with past medical history of Gold stage III COPD and non-small lung cancer status post resection on April 04, 2010, the left upper lobe.  Also, has a history of hypertension.  Normal cardiac cath __________.  Was recently admitted with pneumonia on the right, from  January 6 to January 9.  The patient was seen in the office on January 19 by the pulmonologist, and he was feeding well.  The patient woke up on the day of admission, was having shortness of breath.  His temperature was 103, he was having fever and chills.  Chest x-ray shows new right-sided infiltrate.  The patient was admitted to the hospital.  He received IV vancomycin and Zosyn.  He was seen by pulmonologist, Dr. Ileene Hutchinson and recommends speech therapy. The patient had a swallowing eval which recommended nectar-thick liquid diet.  The patient had a CT of the sinus, which shows severe ethmoid sinusitis.  He was started on steroid spray.  The patient was feeling very good.  He is afebrile for 48 hours.  The patient had some rash when he was sick, when he was getting the vancomycin, which did not look like Red man syndrome. Vancomycin was discontinued and Zosyn only continued. The patient was given steroid, one shot IV, and the rash is almost gone. The patient was seen by pulmonologist again, and they recommend to discontinue the Zosyn and continue on Augmentin for 10 days, and he is going to follow up with Dr. Marchelle Gearing next week.  HOSPITAL COURSE  BY PROBLEMS: 1. Recurrent pneumonia.  Unclear if it is aspiration pneumonia.  The     patient did feel better after 5 days of intravenous antibiotics.     We will continue Augmentin for 10 days, and patient also needs to     follow up with pulmonologist in 2 weeks.  The patient was given     instruction to eat nectar-thick liquid, with reflux precautions. 2. History of non-small cell lung cancer status post wedge resection,     follow up with Dr. Jovita Gamma. 3. History of COPD Gold stage III, stable.  Continue with nebulizer     Spiriva. 4. Hypertension.  Continue home dose of     bisoprolol/hydrochlorothiazide. 5. Hypothyroid.  Continue with levothyroxine.  DISPOSITION:  Home.  DIET:  Nectar-thick liquids.           ______________________________ Pincus Large, MD     SA/MEDQ  D:  10/24/2010  T:  10/25/2010  Job:  [id number]  Electronically Signed by Pincus Large MD on 10/28/2010 01:25:14 PM

## 2010-11-03 ENCOUNTER — Other Ambulatory Visit: Payer: Self-pay | Admitting: Thoracic Surgery

## 2010-11-03 DIAGNOSIS — C3410 Malignant neoplasm of upper lobe, unspecified bronchus or lung: Secondary | ICD-10-CM

## 2010-11-04 ENCOUNTER — Ambulatory Visit (INDEPENDENT_AMBULATORY_CARE_PROVIDER_SITE_OTHER): Payer: Self-pay | Admitting: Thoracic Surgery

## 2010-11-04 ENCOUNTER — Ambulatory Visit
Admission: RE | Admit: 2010-11-04 | Discharge: 2010-11-04 | Disposition: A | Discharge status: Home / Self Care | Payer: BC Managed Care – PPO | Source: Ambulatory Visit | Attending: Thoracic Surgery | Admitting: Thoracic Surgery

## 2010-11-04 DIAGNOSIS — C3410 Malignant neoplasm of upper lobe, unspecified bronchus or lung: Secondary | ICD-10-CM

## 2010-11-05 NOTE — Assessment & Plan Note (Signed)
OFFICE VISIT  [name], [name] DOB:  [birth year]                                        November 04, 2010 CHART #:  [id number]  HISTORY:  The patient comes in today for [name] 3-week postoperative followup.  He is status post left upper lobe wedge resection on September 04, 2010.  Since his last visit, he has been hospitalized again for aspiration pneumonia.  He did have [name] swallow study during that admission which confirmed aspiration and is currently on nectar thickened liquid diet.  He has [name] followup scheduled with Dr. Marchelle Gearing in 2 weeks and hopefully they will reevaluate his swallowing at that time.  He is now finished with his course of antibiotics.  Otherwise, he is doing well postsurgery.  His breathing has been stable and he denies any incisional pain.  PHYSICAL EXAMINATION:  Vital Signs:  Blood pressure is 123/74, pulse is 45, respirations 20, and O2 sat 94% on room air.  His left mini thoracotomy incision and chest tube sites have all healed well.  Heart: Regular rate and rhythm without murmurs, rubs, or gallops.  Lungs: Clear to auscultation.  DIAGNOSTIC TESTS:  Chest x-ray shows some minimal atelectasis, otherwise within normal limits.  ASSESSMENT/PLAN:  The patient is doing well status post left video- assisted thoracoscopic surgery.  He has seen Dr. Gilman Buttner for oncology followup and does not require any adjuvant chemotherapy at this time. She will see him back in 3 months with [name] repeat CT scan.  Dr. [name] has seen the patient and reviewed his films today and we will plan on seeing him back in 2 months with [name] chest x-ray and he may call in the interim if he experiences any problems or has questions.  Coral Ceo, P.[name].  GC/MEDQ  D:  11/04/2010  T:  11/05/2010  Job:  [id number]  cc:   Kalman Shan, MD Dellia Beckwith, M.D.

## 2010-11-11 ENCOUNTER — Ambulatory Visit (INDEPENDENT_AMBULATORY_CARE_PROVIDER_SITE_OTHER): Payer: BC Managed Care – PPO | Admitting: Internal Medicine

## 2010-11-11 ENCOUNTER — Encounter: Payer: Self-pay | Admitting: Internal Medicine

## 2010-11-11 DIAGNOSIS — R1319 Other dysphagia: Secondary | ICD-10-CM

## 2010-11-11 DIAGNOSIS — J180 Bronchopneumonia, unspecified organism: Secondary | ICD-10-CM

## 2010-11-11 DIAGNOSIS — R1310 Dysphagia, unspecified: Secondary | ICD-10-CM | POA: Insufficient documentation

## 2010-11-11 HISTORY — DX: Other dysphagia: R13.19

## 2010-11-12 ENCOUNTER — Other Ambulatory Visit (HOSPITAL_COMMUNITY): Payer: Self-pay | Admitting: Physical Therapy

## 2010-11-13 ENCOUNTER — Other Ambulatory Visit (HOSPITAL_COMMUNITY): Payer: Self-pay | Admitting: Internal Medicine

## 2010-12-01 LAB — CBC
HCT: 37.2 % — ABNORMAL LOW (ref 39.0–52.0)
Hemoglobin: 12.3 g/dL — ABNORMAL LOW (ref 13.0–17.0)
Hemoglobin: 13.2 g/dL (ref 13.0–17.0)
MCH: 28.7 pg (ref 26.0–34.0)
MCH: 29 pg (ref 26.0–34.0)
MCH: 29.2 pg (ref 26.0–34.0)
MCHC: 33.1 g/dL (ref 30.0–36.0)
MCHC: 33.5 g/dL (ref 30.0–36.0)
MCV: 86.7 fL (ref 78.0–100.0)
Platelets: 156 10*3/uL (ref 150–400)
Platelets: 176 10*3/uL (ref 150–400)
RBC: 4.35 MIL/uL (ref 4.22–5.81)
RBC: 4.56 MIL/uL (ref 4.22–5.81)
RDW: 13.1 % (ref 11.5–15.5)
RDW: 13.2 % (ref 11.5–15.5)
RDW: 13.2 % (ref 11.5–15.5)
WBC: 9.7 10*3/uL (ref 4.0–10.5)

## 2010-12-01 LAB — COMPREHENSIVE METABOLIC PANEL
ALT: 17 U/L (ref 0–53)
AST: 22 U/L (ref 0–37)
Albumin: 2.7 g/dL — ABNORMAL LOW (ref 3.5–5.2)
CO2: 29 mEq/L (ref 19–32)
Calcium: 8.1 mg/dL — ABNORMAL LOW (ref 8.4–10.5)
Calcium: 8.9 mg/dL (ref 8.4–10.5)
Chloride: 97 mEq/L (ref 96–112)
Creatinine, Ser: 1.15 mg/dL (ref 0.4–1.5)
Creatinine, Ser: 1.24 mg/dL (ref 0.4–1.5)
GFR calc Af Amer: >60 mL/min (ref >60)
GFR calc Af Amer: >60 mL/min (ref >60)
GFR calc non Af Amer: 60 mL/min — ABNORMAL LOW (ref >60)
Glucose, Bld: 96 mg/dL (ref 70–99)
Sodium: 135 mEq/L (ref 135–145)
Total Bilirubin: 1.9 mg/dL — ABNORMAL HIGH (ref 0.3–1.2)
Total Protein: 5.5 g/dL — ABNORMAL LOW (ref 6.0–8.3)
Total Protein: 5.6 g/dL — ABNORMAL LOW (ref 6.0–8.3)

## 2010-12-01 LAB — POCT I-STAT 3, ART BLOOD GAS (G3+)
TCO2: 29 mmol/L (ref 0–100)
pCO2 arterial: 48.3 mmHg — ABNORMAL HIGH (ref 35.0–45.0)
pH, Arterial: 7.371 (ref 7.350–7.450)

## 2010-12-01 LAB — TYPE AND SCREEN: Unit division: 0

## 2010-12-01 LAB — BASIC METABOLIC PANEL
BUN: 18 mg/dL (ref 6–23)
CO2: 27 mEq/L (ref 19–32)
CO2: 29 mEq/L (ref 19–32)
Calcium: 7.8 mg/dL — ABNORMAL LOW (ref 8.4–10.5)
Chloride: 98 mEq/L (ref 96–112)
GFR calc Af Amer: >60 mL/min (ref >60)
GFR calc non Af Amer: >60 mL/min (ref >60)
Glucose, Bld: 130 mg/dL — ABNORMAL HIGH (ref 70–99)
Potassium: 4.4 mEq/L (ref 3.5–5.1)
Sodium: 136 mEq/L (ref 135–145)

## 2010-12-01 LAB — MRSA PCR SCREENING: MRSA by PCR: NEGATIVE

## 2010-12-01 LAB — HEPATIC FUNCTION PANEL
Alkaline Phosphatase: 75 U/L (ref 39–117)
Bilirubin, Direct: 0.4 mg/dL — ABNORMAL HIGH (ref 0.0–0.3)
Total Bilirubin: 1.3 mg/dL — ABNORMAL HIGH (ref 0.3–1.2)

## 2010-12-01 LAB — MAGNESIUM: Magnesium: 2.1 mg/dL (ref 1.5–2.5)

## 2010-12-01 LAB — TSH: TSH: 0.011 u[IU]/mL — ABNORMAL LOW (ref 0.350–4.500)

## 2010-12-01 LAB — URIC ACID: Uric Acid, Serum: 7.8 mg/dL (ref 4.0–7.8)

## 2010-12-02 LAB — COMPREHENSIVE METABOLIC PANEL
ALT: 20 U/L (ref 0–53)
AST: 18 U/L (ref 0–37)
Albumin: 3.9 g/dL (ref 3.5–5.2)
Alkaline Phosphatase: 106 U/L (ref 39–117)
CO2: 25 mEq/L (ref 19–32)
Chloride: 107 mEq/L (ref 96–112)
Creatinine, Ser: 1 mg/dL (ref 0.4–1.5)
GFR calc Af Amer: >60 mL/min (ref >60)
GFR calc non Af Amer: >60 mL/min (ref >60)
Potassium: 4.3 mEq/L (ref 3.5–5.1)
Sodium: 139 mEq/L (ref 135–145)
Total Bilirubin: 0.5 mg/dL (ref 0.3–1.2)

## 2010-12-02 LAB — CBC
Hemoglobin: 14.4 g/dL (ref 13.0–17.0)
Platelets: 206 10*3/uL (ref 150–400)
RBC: 5.13 MIL/uL (ref 4.22–5.81)
WBC: 8 10*3/uL (ref 4.0–10.5)

## 2010-12-02 LAB — BLOOD GAS, ARTERIAL
Acid-Base Excess: 3.1 mmol/L — ABNORMAL HIGH (ref 0.0–2.0)
Drawn by: 206361
pCO2 arterial: 46.9 mmHg — ABNORMAL HIGH (ref 35.0–45.0)
pH, Arterial: 7.389 (ref 7.350–7.450)
pO2, Arterial: 76.1 mmHg — ABNORMAL LOW (ref 80.0–100.0)

## 2010-12-02 LAB — URINALYSIS, ROUTINE W REFLEX MICROSCOPIC
Glucose, UA: NEGATIVE mg/dL
Protein, ur: NEGATIVE mg/dL
Specific Gravity, Urine: 1.008 (ref 1.005–1.030)

## 2010-12-02 LAB — APTT: aPTT: 27 seconds (ref 24–37)

## 2010-12-02 NOTE — Assessment & Plan Note (Signed)
Summary: HOSP FOLLOW-UP/LED   Visit Type:  Follow-up Primary Provider/Referring Provider:  Dr. Arrie [name] in [place]  CC:  HFU for pnemonia.  Pt has questions about albuterol mebulizer vs inhaler. .  History of Present Illness: BRIEF: EX-smoker, EX-etoh.  [name] patient since August 2009 when admitted to ICU for  Strep PNA bilateral bibaasl consolidation neeeding APRV. Gold STage 2 COPD MM genotype.  s/p LUL wedge resection for stg Ia squamous cell CA [name] ) in dec '11    October 09, 2010: Admotted 09/26/2010 through 09/29/2010 with RML and RLL pneumonia. Now presenting for hospital followup. Feeling well. No complaints other than mild deconditioning. He feels like he needs an exercise program. Denies dyspnea, cough, wheeze, sputum, edema, hemoptysis. Due to see Dr. Edwyna [name] in few days. Had CXR 10/07/2010 that shows clearing of RML and RLL pneumonia.   Nov 11, 2010: Admitted again 1/29//2012 through 10/24/2010 for recurrent RML and RLL pneumonia. Etiology felt to be severe ethmpid sinusitis seen on CT and also aspiration pneumonia confirmed on swallow study. Currently doing well and in stable [place]. No recurrence of pneumonia. Had fu CXR 2/14/212 that shows improved RLL and RML air space disease (independently reivewed)    Preventive Screening-Counseling & Management  Alcohol-Tobacco     Smoking Status: quit     Year Quit: 2002     Pack years: 42 yrs 21/2 yrs     Tobacco Counseling: not to resume use of tobacco products  Current Medications (verified): 1)  Adprin B 325 Mg Tabs (Aspirin Buf(Cacarb-Mgcarb-Mgo)) .... Once Daily 2)  Synthroid 200 Mcg Tabs (Levothyroxine Sodium) .... Take 250mg  Daily 3)  Spiriva Handihaler 18 Mcg Caps (Tiotropium Bromide Monohydrate) .... Once Daily 4)  Bisoprolol-Hydrochlorothiazide 2.5-6.25 Mg Tabs (Bisoprolol-Hydrochlorothiazide) .... Once Daily  Allergies (verified): No Known Drug Allergies  Past History:  Past medical, surgical, family  and social histories (including risk factors) reviewed, and no changes noted (except as noted below).  Past Medical History: Reviewed history from 10/09/2010 and no changes required. #ICU Illness - August 2009   - Strep PNA.  Aug 11-30, 2009. Needing APRV. Cleared on CT chest Oct 2009   -  Congestive heart failure with diastolic dysfunction. Normal coronaries on cath 05/04/2008   -  Gastrointestinal obstruction ileus.   - Icu agitation/delirium  - Pulm Htn 05/04/2008 - PCWP 17. PA 56/32, CO 9-10L/min.  placed on expectant followup #. Polycythemia.Marland KitchenMarland KitchenDr [name]    - diagnpsed 04/2008  -  Phlebotomy under consideration  #. Hypothyrodisim #GOLD STAGE 2 COPD..........................Marland KitchenRamaswamy    [name] 12/03/2008 - FEv1 2.4L/70%. - Rx spiriva  -  MM genotype Sept 2011 #Obesity    - intentional weight loss after critical illness #RUL Posterior segment consolidative process...  - New Oct 2009  - Dec 2009: Mild reduction  - March 2010 - Stable compared to dec 2009  - Sept 2010 - further reduction  - SEpt 2011 - stable  - Oct 2011 - resolved on PET CT and no active uptake -> No further followup #RUL Anterior segment pneumonic process  - Sept 2011: New on CT. Aecopd symptoms +  - Oct 2011: Cleared  up #LUL Lung Cancer  - s/p wedge resection 09/04/2010  Past Surgical History: Reviewed history from 06/05/2008 and no changes required. appendectomy and some type of back surgery  RT and LHC - 05/04/2008 1. Right atrial pressure 15.   2. RV 56/40.   3. [name] artery 56/32.   4. [name] capillary wedge 17.  5. Aortic 118/18.   6. LV 120/82, mean 99.   7. No gradient on pullback across aortic valve.   8. No LV [name] capillary wedge gradient.   9. Superior vena cava saturation 75%.   10.[name] artery saturation 78%.  12. Normal Left heart cath   11.Aortic saturation 95%.   12.Fick cardiac output 10.9 L/min.   13.Thermodilution cardiac output 8.9 L  Family  History: Reviewed history from 06/05/2008 and no changes required. heart disease-father Mother is alive and well at the age of 73.  His father   died in his 108s with some type of heart problems.  He has three sisters   that are living and wife feels that one of his sister has some type of   lung problem.   Social History: Reviewed history from 06/05/2008 and no changes required. The patient resides in [name] with his wife.  They   have one daughter and one granddaughter.  He has been laid off from   [name] where he was a [employer].  He has not   smoked in 7 years.  Prior to that, he was smoking at least three packs   per day for unknown duration.  The patient's wife states that he has had   a history of heavy alcohol use, but denies actually alcoholism or   problems with withdrawal. He drinks approximately a six-pack   a day but quit 04/2008 after admission for PNA.  She denies any known drug or herbal medication use.  The patient did  not exercise nor does he follow a specific diet bu after 04/2008 admission for pna; exercises daily and is following a diet  Review of Systems  The patient denies shortness of breath with activity, shortness of breath at rest, productive cough, non-productive cough, coughing up blood, chest pain, irregular heartbeats, acid heartburn, indigestion, loss of appetite, weight change, abdominal pain, difficulty swallowing, sore throat, tooth/dental problems, headaches, nasal congestion/difficulty breathing through nose, sneezing, itching, ear ache, anxiety, depression, hand/feet swelling, joint stiffness or pain, rash, change in color of mucus, and fever.    Vital Signs:  Patient profile:   60 year old male Height:      72 inches Weight:      221.50 pounds BMI:     30.15 O2 Sat:      95 % on Room air Temp:     98.1 degrees F oral Pulse rate:   40 / minute BP sitting:   110 / 72  (right arm) Cuff size:   regular  Vitals Entered By:  [name] CMA (November 11, 2010 2:11 PM)  O2 Flow:  Room air CC: HFU for pnemonia.  Pt has questions about albuterol mebulizer vs inhaler.  Comments Medications reviewed with patient [name] CMA  November 11, 2010 2:14 PM Daytime phone number verified with patient.    Physical Exam  General:  overweight.   Head:  normocephalic and atraumatic Eyes:  PERRLA/EOM intact; conjunctiva and sclera clear Ears:  TMs intact and clear with normal canals Nose:  no deformity, discharge, inflammation, or lesions Mouth:  no deformity or lesions Neck:  no masses, thyromegaly, or abnormal cervical nodes Chest Wall:  no deformities noted Lungs:  clear bilaterally to auscultation and percussion Heart:  regular rate and rhythm, S1, S2 without murmurs, rubs, gallops, or clicks Abdomen:  bowel sounds positive; abdomen soft and non-tender without masses, or organomegaly Msk:  no deformity or scoliosis noted with normal posture Pulses:  pulses normal Extremities:  no clubbing, cyanosis, edema, or deformity noted. Dry skin in dorsum of hand as before Neurologic:  CN II-XII grossly intact with normal reflexes, coordination, muscle strength and tone Skin:  intact without lesions or rashes Cervical Nodes:  no significant adenopathy Axillary Nodes:  no significant adenopathy Psych:  alert and cooperative; normal mood and affect; normal attention span and concentration   CXR  Procedure date:  11/04/2010  Findings:      Ordered By: DEFAULT MD , PROVIDER         Ordered Date:  Facility: [facility]                              Department: DG  Service Report Text  [facility] Accession Number: [id number]     Clinical Data: History of lung carcinoma with surgery December   2011, follow-up, former smoker    CHEST - 2 VIEW    Comparison: Chest x-ray of 10/21/2010    Findings: Aeration of the right lung base has improved.  No   infiltrate or effusion is seen and no pneumothorax is noted.    Mediastinal contours are stable.  No bony abnormality is seen.    IMPRESSION:   Improved aeration.  Resolution of airspace disease at the right   lung base.    Original Report Authenticated By: Juline Patch, M.D.   Comments:      independently rviwed  CXR  Procedure date:  11/04/2010  Findings:       Had fu CXR 2/14/212 that shows improved RLL and RML air space disease (independently reivewed  Impression & Recommendations:  Problem # 1:  OTHER DYSPHAGIA (ICD-787.29) Assessment Unchanged follow speech recs repeat swallow study in few months and then fu Orders: Speech Therapy (Speech Therapy) Est. Patient Level III (54098)  Problem # 2:  BRONCHOPNEUMONIA ORGANISM UNSPECIFIED (ICD-485) Assessment: Improved  Orders: Speech Therapy (Speech Therapy) Est. Patient Level III (11914)  RML and RLL pna early Jan 2012. Cleared n CXR 10/07/2010.  RML and RLL pna recurrence Late Jan/Early Feb 2012. Clearing on cxr 11/04/2010.    - etiology deemed as aspiration and ethmoid sinusitis  plan follow speech recs repeat swalllow study if recurs again on right then plan bronch  Problem # 3:  C O P D (ICD-496) Assessment: Unchanged  sable. no exertional desa with walking 185 feet x 3 laps on 10/09/2010  plan conitnue inhalers recommend [name] rehab  Medications Added to Medication List This Visit: 1)  Synthroid 200 Mcg Tabs (Levothyroxine sodium) .... Take 250mg  daily  Patient Instructions: 1)  glad you are doing better 2)  continue yoru medications' 3)   you should use the albuterol nebulizer as needed 4)  have speech evaluation swallow study in around end march 2012 5)  return to see me in 3 months

## 2010-12-03 LAB — GLUCOSE, CAPILLARY: Glucose-Capillary: 107 mg/dL — ABNORMAL HIGH (ref 70–99)

## 2010-12-04 NOTE — H&P (Signed)
[name], [name] NO.:  [id number]  MEDICAL RECORD NO.:  [id number]          PATIENT TYPE:  EMS  LOCATION:  MAJO                         FACILITY:  [facility]  PHYSICIAN:  Ramiro Harvest, MD    DATE OF BIRTH:  03-11-1951  DATE OF ADMISSION:  10/19/2010 DATE OF DISCHARGE:                             HISTORY & PHYSICAL   PRIMARY CARE PHYSICIAN:  Lacretia Leigh. Quintella Reichert, MD  CARDIOVASCULAR SURGEON:  Ines Bloomer, MD  PULMONOLOGIST:  Kalman Shan, MD of [facility].  CHIEF COMPLAINT:  Shortness of breath and fever.  HISTORY OF PRESENT ILLNESS:  The patient is a 60 year old Caucasian gentleman, history of squamous cell cancer, stage IA status post left upper lobe wedge resection September 04, 2010, history of GOLD stage III COPD, history of bilateral strep pneumonia requiring hospitalization and mechanical ventilation in 2009, history of hypertension, hypothyroidism, recently discharged September 29, 2010 for a right lower lobe pneumonia, presented to the ED with a several hour history of shortness of breath and fever with a temperature of 103.  The patient states that he awoke around 3:00 a.m. on the morning of admission with some shortness of breath and a fever with a temperature as high as 103 that lasted approximately 3 hours.  His sats when he checked at home were between 85- 87%.  The patient does endorse some nausea, had an episode of nonbloody emesis, none since and no change in his chest discomfort which he has had since his surgery.  The patient denies any abdominal pain, no diarrhea, no constipation.  No cough, no dysuria, no dysphasia.  No focal neurological symptoms.  The patient does endorse some generalized weakness.  Procalcitonin levels which were obtained came back at 17.58, lactic acid level came back at 2.0, D-dimer came back at 1.09, BMET with a sodium of 133, otherwise also within normal limits.  Point-of-care cardiac markers were  negative.  CBC had a white count of 14.7, otherwise was within normal limits.  Chest x-ray did show recurrent right middle lobe and right lower lobe infiltrates.  We were called to admit the patient for further evaluation and management.  ALLERGIES:  Paxil causes hallucinations.  PAST MEDICAL HISTORY: 1. Squamous cell lung cancer, stage I status post left upper lobe     wedge resection September 04, 2010. 2. GOLD stage III COPD. 3. History of bilateral strep pneumonia requiring hospitalization, ICU     admission, including mechanical ventilation in 2009. 4. Questionable history of polycythemia vera being followed by Dr.     Si Gaul. 5. Normal coronary artery per catheterization in 2009. 6. Hypertension. 7. Vitiligo. 8. History of alcohol use. 9. History of postoperative atrial fibrillation. 10.Hypothyroidism. 11.Hard of hearing on the left side. 12.History of rosacea. 13.History of situational depression in the past. 14.Status post appendectomy. 15.Status post back surgery. 16.Status post left upper lobe wedge resection, September 04, 2010. 17.History of erectile dysfunction.  HOME MEDICATIONS:  This will need to be verified however the patient endorses taking: 1. Aspirin 325 mg p.o. daily. 2. Spiriva 18 mcg 1 inhalation daily. 3. Bisoprolol/HCTZ 2.5/6.25 mg p.o.  daily. 4. Levothyroxine 300 mcg p.o. daily. 5. Percocet 5/325 one to two tablets p.o. q.4 h. p.r.n. pain. 6. Viagra as needed.  FAMILY HISTORY:  Father deceased age 21 from heart failure.  Mother alive in her 51s.  MEDICAL HISTORY:  Unknown.  FAMILY HISTORY:  Also positive for coronary artery disease.  SOCIAL HISTORY:  The patient is a former tobacco abuser, quit in 2003, also prior alcohol use.  No current alcohol use.  No IV drug use.  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  REVIEW OF SYSTEMS:  As per HPI, otherwise negative.  PHYSICAL EXAMINATION:  VITAL SIGNS:  Temperature 98.6, blood pressure of 97/65 up to 110/60, pulse of 85, respirations 21, satting 92% on room air. GENERAL:  The patient is a well-developed, well-nourished gentleman in no acute cardiopulmonary distress. HEENT: Normocephalic, atraumatic.  Pupils equal, round and reactive to light and accommodation.  Extraocular movements intact.  Oropharynx is clear.  No lesions.  No exudates. NECK:  Supple.  No lymphadenopathy. RESPIRATORY:  Coarse breath sounds on the right greater than left.  No wheezes. CARDIOVASCULAR:  Regular rate and rhythm.  No murmurs, rubs, or gallops. ABDOMEN:  Soft, nontender, nondistended.  Positive bowel sounds. EXTREMITIES:  No clubbing, cyanosis, or edema. NEUROLOGIC:  The patient is alert and oriented x3.  Cranial Nerves: II- XII are grossly intact with no focal deficits.  ADMISSION LABORATORY DATA:  Procalcitonin level 17.58, lactic acid level 2.0, D-dimer 1.09.  BMET:  Sodium 133, potassium 4.4, chloride 97, bicarb 25, glucose 107, BUN 21, creatinine 1.19, and calcium of 9.0. Point-of-care cardiac markers CK-MB 1.5, troponin-I less than 0.05, myoglobin of 217.  CBC white count 14.7, hemoglobin 12.5, hematocrit 38.8, platelet count of 201, ANC of 12.9.  A chest x-ray shows recurrent right middle and lower lobe infiltrates.  Aspiration would be a consideration.  ASSESSMENT AND PLAN:  [name] is a 60 year old gentleman, history of squamous cell lung cancer, stage IA status post left upper lobe wedge resection in September 04, 2010, history of a GOLD stage III COPD, recent discharge September 29, 2010 for right lobe pneumonia, presented to the ED with shortness of breath and a fever, found to have a pneumonia per chest x-ray in the right middle and right lower lobe. 1. Recurrent healthcare associated pneumonia, right middle lobe and     right lower lobe, questionable etiology.  The patient does have a     history of squamous cell lung cancer status  post left upper lobe     wedge resection, recent discharge on September 29, 2010 for pneumonia.     We will admit the patient.  Check a sputum Gram stain and culture.     Check a urine Legionella pneumococcus antigen.  We will have Speech     Therapy evaluate for aspiration.  We will check blood cultures x2.     We will place on oxygen.  Nebs as needed.  Place on IV vancomycin     and Zosyn and follow.  We will consult with Pulmonary in the     morning. 2. Elevated D-dimer.  CT angio is pending to rule out pulmonary     embolism. 3. Leukocytosis, likely secondary to healthcare-associated pneumonia.     We will check a sputum Gram stain and culture.  Check a urine     Legionella and pneumococcus antigen.  Check blood cultures x2.     Check a UA  with cultures and sensitivities.  We will place on IV     vancomycin and Zosyn to cover for healthcare-associated pneumonia. 4. Squamous cell lung cancer, stage IA status post left upper lobe     wedge resection. 5. Chronic obstructive pulmonary disease GOLD stage III, stable.     Continue O2 nebs as needed.  Spiriva.  See problem #1. 6. Hypertension.  Continue home dose of bisoprolol and HCTZ. 7. Hypothyroidism.  Check a TSH.  Continue home dose levothyroxine. 8. Prophylaxis:  Protonix for GI prophylaxis.  Lovenox for DVT     prophylaxis.  It has been a pleasure taking care of [name].     Ramiro Harvest, MD     DT/MEDQ  D:  10/19/2010  T:  10/19/2010  Job:  [id number]  cc:   Lacretia Leigh. Quintella Reichert, M.D. Ines Bloomer, M.D. Kalman Shan, MD  Electronically Signed by Ramiro Harvest MD on 12/04/2010 07:59:31 PM

## 2010-12-15 ENCOUNTER — Ambulatory Visit (HOSPITAL_COMMUNITY)
Admission: RE | Admit: 2010-12-15 | Discharge: 2010-12-15 | Disposition: A | Discharge status: Home / Self Care | Payer: BC Managed Care – PPO | Source: Ambulatory Visit | Attending: Internal Medicine | Admitting: Internal Medicine

## 2010-12-15 ENCOUNTER — Other Ambulatory Visit (HOSPITAL_COMMUNITY): Payer: BC Managed Care – PPO

## 2010-12-15 DIAGNOSIS — R1310 Dysphagia, unspecified: Secondary | ICD-10-CM | POA: Insufficient documentation

## 2011-01-05 ENCOUNTER — Other Ambulatory Visit: Payer: Self-pay | Admitting: Thoracic Surgery

## 2011-01-05 DIAGNOSIS — C3410 Malignant neoplasm of upper lobe, unspecified bronchus or lung: Secondary | ICD-10-CM

## 2011-01-06 ENCOUNTER — Ambulatory Visit (INDEPENDENT_AMBULATORY_CARE_PROVIDER_SITE_OTHER): Payer: BC Managed Care – PPO | Admitting: Thoracic Surgery

## 2011-01-06 ENCOUNTER — Ambulatory Visit
Admission: RE | Admit: 2011-01-06 | Discharge: 2011-01-06 | Disposition: A | Discharge status: Home / Self Care | Payer: BC Managed Care – PPO | Source: Ambulatory Visit | Attending: Thoracic Surgery | Admitting: Thoracic Surgery

## 2011-01-06 DIAGNOSIS — C3410 Malignant neoplasm of upper lobe, unspecified bronchus or lung: Secondary | ICD-10-CM

## 2011-01-06 DIAGNOSIS — C3490 Malignant neoplasm of unspecified part of unspecified bronchus or lung: Secondary | ICD-10-CM

## 2011-01-07 NOTE — Assessment & Plan Note (Signed)
OFFICE VISIT  [name], [name] A DOB:  01/08/1951                                        January 06, 2011 CHART #:  [id number]  The patient came for followup today.  His incision is well-healed.  His chest x-ray is stable.  He takes an occasional Percocet.  He is doing well overall.  He was initially getting ready to go back to work.  He will be followed up by Dr. Gilman Buttner and will be seeing her tomorrow.  I will see him again if he has any future problems.  Ines Bloomer, M.D. Electronically Signed  DPB/MEDQ  D:  01/06/2011  T:  01/07/2011  Job:  [id number]

## 2011-02-03 NOTE — Cardiovascular Report (Signed)
NAMEHISAO, DOO NO.:  [id number]   MEDICAL RECORD NO.:  [id number]          PATIENT TYPE:  INP   LOCATION:  2113                         FACILITY:  [facility]   PHYSICIAN:  Arturo Morton. Riley Kill, MD, FACCDATE OF BIRTH:  1951-04-05   DATE OF PROCEDURE:  05/04/2008  DATE OF DISCHARGE:                            CARDIAC CATHETERIZATION   INDICATIONS:  The patient has been intubated with acute-on-chronic  respiratory failure.  The patient was seen in consultation by Dr.  Antoine Poche.  Subsequent to this time, it was felt that a cardiac  catheterization would be helpful.  The patient has significant carbon  dioxide retention.  Brought to the lab now for further evaluation.  I  spoke with the patient's wife prior to the procedure.  The patient was  thought to have respiratory failure.  The patient also has significant  polycythemia.   PROCEDURES:  1. Right and left heart catheterization.  2. Selective coronary arteriography.  3. Selective left ventriculography.   DESCRIPTION OF PROCEDURE:  The patient was brought to the  catheterization laboratory and prepped and draped in usual fashion.  We  elected to use a 5-French catheters for the arterial, and a 7.5 French  thermodilution Swan-Ganz catheter for right heart pressures.  Using a  SmartNeedle, a right femoral vein was entered.  Superior vena cava  saturations and serial right heart pressures were recorded.  Pulmonary  artery saturation was recorded.  Thermodilution cardiac outputs  performed.  Following this, a 5-French sheath was placed in the right  femoral artery.  Pigtail catheter was placed in the central aorta and  left ventricular cavities.  Simultaneous LV wedge was obtained.  Following this, ventriculography was performed in the RAO projection.  Following the pressure pullback, the pigtail was removed.  Coronary  arteriography was then performed without complication.  I then reviewed  the findings with the  patient's family, and spoke with Dr. Antoine Poche.  He  was taken to the holding area in satisfactory clinical condition for  sheath removal.   HEMODYNAMIC DATA:  1. Right atrial pressure 15.  2. RV 56/40.  3. Pulmonary artery 56/32.  4. Pulmonary capillary wedge 17.  5. Aortic 118/18.  6. LV 120/82, mean 99.  7. No gradient on pullback across aortic valve.  8. No LV pulmonary capillary wedge gradient.  9. Superior vena cava saturation 75%.  10.Pulmonary artery saturation 78%.  11.Aortic saturation 95%.  12.Fick cardiac output 10.9 L/min.  13.Thermodilution cardiac output 8.9 L/min.   ANGIOGRAPHIC DATA:  1. Ventriculography in the RAO projection was interrupted by      ventricular ectopy, but overall LV function appeared be moderately      vigorous.  2. The left main is free of critical disease.  3. The LAD courses to the apex.  Because of the size of the coronary      arteries, there was a fair amount of streaming, but careful      analysis did not suggest high-grade obstruction.  There are 2      modest size diagonal branches.  4. There is a  ramus intermedius that courses out over the apex and is      free of critical disease.  The AV circumflex courses posteriorly      and provides the posterior section of the heart and without      critical obstruction.  5. The right coronary artery is a very large-caliber vessel without      critical narrowing.  There is a large RV branch as well.   CONCLUSION:  1. Preserved overall left ventricular function.  2. Increased pulmonary vascular resistance likely secondary to      intrinsic pulmonary hypertension secondary to lung disease.   DISPOSITION:  Further management by Dr. Antoine Poche and doctor in the  pulmonary critical care team.      Arturo Morton. Riley Kill, MD, [facility]  Electronically Signed     TDS/MEDQ  D:  05/04/2008  T:  05/05/2008  Job:  [id number]   cc:   Rollene Rotunda, MD, [facility]  CV Laboratory  Charlaine Dalton. Sherene Sires, MD, FCCP

## 2011-02-03 NOTE — Discharge Summary (Signed)
[name], [name]                ACCOUNT NO.:  [id number]   MEDICAL RECORD NO.:  [id number]          PATIENT TYPE:  INP   LOCATION:  3701                         FACILITY:  [facility]   PHYSICIAN:  Kalman Shan, MD   DATE OF BIRTH:  06-29-1951   DATE OF ADMISSION:  05/01/2008  DATE OF DISCHARGE:                               DISCHARGE SUMMARY   DISCHARGE DIAGNOSES:  1. Independent respiratory failure with hypoxic respiratory failure      with a setting of chronic obstructive pulmonary disease.  2. Congestive heart failure with diastolic dysfunction.  3. Gastrointestinal obstruction ileus.  4. Polycythemia.  5. Community-acquired pneumonia.  - streptococcus pneumoniae in sputum  6. Agitation.  7. Hypokalemia.  8. Hypernatremia.  9. Hyperthyroidism.   HISTORY OF PRESENT ILLNESS:  [name] is a 24 white male who had  a history of 2 months of not feeling well.  Of note, he had been  drinking beer heavily up to a case of beer a week for multiple years.  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  Despite being  evaluated several times on an outpatient basis, he continued to decline.  He was brought to the [facility] emergency department, found to  be in acute respiratory distress, treated with BIPAP, which was  unsuccessful and subsequently required oral endotracheal intubation and  mechanical and ventilator support.   PROCEDURE:  1. Orotracheal intubation from August 11 to May 15, 2008.  2. Left internal jugular central venous catheter from August 8 to      May 15, 2008.  3. Right heart catheterization, May 04, 2008 showed a pulmonary      capillary wedge pressure of 17, a PAP of 56/30.  Left heart      catheterization with normal LV, with normal coronaries, and a right      radial arterial line from August 18 to subsequent removal.   ANTIBIOTICS:  1. Consisted of Zosyn from August 11 to May 04, 2008.  2. ? drug from August 14 to  May 04, 2008.  3. Rocephin from August 14 to May 05, 2008, up until May 11, 2008.  4. Azithromycin August 15 to May 11, 2008.  5. P.O. Flagyl empirically from August 16 to May 09, 2008.  6. Vancomycin from August 21 to May 15, 2008.  7. Primaxin from August 21 to May 15, 2008.  8. Erythromycin for ileus on May 15, 2008 for 4 days.   His bronchoalveolar lavage demonstrated strep pneumonia, which was pan  sensitive.  Blood cultures were negative.  C. diff was negative.  They  all on May 06, 2008 showed normal flora.  Sputum showed no growth.  Blood was negative.  Catheter tip was negative.  Blood culture showed no  growth.  Prophylaxis on Lovenox, DVT dose and Protonix for ulcer  protection.   LAB DATA:  Hemoglobin 14.9, hematocrit 46.3, WBC is 10.3.  Platelets are  363.  Sodium 135, potassium 3.8, chloride 102, CO2 is 26.  BUN is 8.  Creatinine is 1.69.  Glucose is 90.  AST is 42.  ALT is 24.  Alkaline  phosphatase 50.  Total bilirubin is 1.9.  Albumin is 3.0.  Calcium is  8.3.  Magnesium 2.2.  Phosphorus 3.1.   On May 16, 2008, abdominal x-ray demonstrates feeding tube in region  assessed above.  Bowel gas pattern consistent with small bowel  obstruction pattern.   Chest x-ray May 16, 2008 demonstrates mild haziness, right upper  lung, but lungs are otherwise clear.   HOSPITAL COURSE BY DISCHARGE DIAGNOSIS:  1. Vent-dependent respiratory failure secondary to acute exacerbation      of chronic pulmonary disease, hypoxemia, and Streptococcus      Pneumonia.  He required orotracheal intubation from May 01, 2008      until successful liberation from mechanical ventilatory support      from May 15, 2008.  He required multiple modes of ventilation,      including APRV along with respiratory volume control.  He has been      successfully liberated from mechanical ventilator support.  He      remains O2 dependent and has been demonstrated by  ambulation to      have the sats going to 84%.  He will continue to follow up with Dr.      Kalman Shan on an outpatient, at which time his O2 sats will      be monitored and should he not need oxygen in the future, it will      be stopped.  2. Congestive heart failure.  He was evaluated with right and left      heart catheterizations as noted.  He does have increased PAP      pressures without blockages.  He was evaluated by Dr. Riley Kill and      Dr. Myrtis Ser during this hospitalization.  3. Obstruction, ileus.  He had erythromycin added to his      pharmaceutical regimen.  This has resolved.  4. Polycythemia.  He did have an evaluation by Dr. Si Gaul of      the hematology-oncology.  He underwent a phlebotomy on May 11, 2008.  He will be evaluated on an outpatient basis per Dr. Si Gaul.  5. Community-acquired pneumonia with Pneumococcus.  He completed      antimicrobial therapy and this has resolved.  6. Agitation.  Most likely secondary to a component of alcohol      withdrawal.  His mental status is now cleared.  7. Hypokalemia and hyponatremia.  Both of these have been resolved      with pharmaceutical intervention.  8. Hypothyroidism.  He will continue his Synthroid.  This will be      followed on an outpatient basis by Dr. Feliciana Rossetti.   DISCHARGE MEDICATIONS:  Note that his pharmaceutical regimen has been  streamlined while in the hospital and medications may need to be added  back at a later time.   1. He will be on home O2, 2 L nasal cannula 24/7.  Hopefully as this      pneumonia resolves, this will be discontinued at a later date.  2. Aspirin 325 mg once a day.  3. Synthroid 300 mcg once daily.  4. Spiriva 18 mcg inhaled daily.   He should be on low sodium, heart healthy diet.   He has got a followup appointment with Dr. Marchelle Gearing, June 05, 2008  at 4:10 p.m.  He has been instructed to follow up with D. Feliciana Rossetti  in 1-2  weeks.  He is to call [phone], and to follow up with Dr. Si Gaul on oncology service.   DISPOSITION/CONDITION ON DISCHARGE:  Improved.   >30 minutes spent in discharge planning by NP Brett Canales Minor. Patient left  before I could personally see him on day of discharge. d      Devra Dopp, MSN, ACNP      Kalman Shan, MD  Electronically Signed    SM/MEDQ  D:  05/21/2008  T:  05/21/2008  Job:  [id number]   cc:   Arturo Morton. Riley Kill, MD, [facility]  Luis Abed, MD, [facility]  Quentin Mulling, MD  Lajuana Matte, MD

## 2011-02-03 NOTE — Letter (Signed)
August 20, 2010   Kalman Shan, MD  [address] [place] [address]  [place], [place] [address]   Re:  CANNON, ARREOLA                  DOB:  21-May-1951   Dear Dr. Marchelle Gearing:   I appreciate the opportunity of seeing the patient.  This 60 year old  patient was found to have a left upper lobe nodule that is increased in  size.  His PET scan was done, which showed an uptake value of 3.2.  He  is referred here for evaluation.  He had a previous bout of pneumonia on  CT scan in September that cleared his PET scan in November.  In 2009, he  was admitted to the ICU for bilateral strep pneumonia.  He has a GOLD  stage II COPD and this nodule was first seen in the apical posterior  segment.  He also has another infiltrate in the posterior segment since  it is there from 2009.  He works driving the truck and lifting and has  no problems with shortness of breath.  Pulmonary function tests showed  an FVC of 76% or 3.32 with an FEV1 of 2.22 or 65% of predicted.  His  diffusion capacity is 82%, corrected to 87%.  He quit smoking in 2002.   MEDICATIONS:  1. Aspirin.  2. Synthroid 1.25 mcg a day for hypothyroidism.  3. Spiriva 18 mcg a day.  4. Bisoprolol and hydrochlorothiazide for hypertension 2.5/6.25.   ALLERGIES:  He is allergic to Paxil, causes feet to swell.   PAST MEDICAL HISTORY:  He was also treated for congestive heart failure  with diastolic dysfunction.  He had normal coronaries on cath and had a  gastrointestinal ileus previously and was thought to have pulmonary  hypertension and polycythemia, which Dr. Arbutus Ped has been following  with.   PAST SURGICAL HISTORY:  He has had his appendectomy and some back  surgery.   FAMILY HISTORY:  Positive for cardiac disease.   SOCIAL HISTORY:  He is married and quit smoking in 2002.  Does not drink  alcohol now, quit drinking in August 2009.   REVIEW OF SYSTEMS:  VITAL SIGNS:  His weight is 233 pounds.  He is 6  feet.  GENERAL:  His weight is  stable.  CARDIAC:  See history of present illness.  PULMONARY:  See history of present illness.  No hemoptysis.  GI:  See past medical history.  GU:  No kidney disease, dysuria, or frequent urination.  VASCULAR:  No claudication, DVT, or TIAs.  NEUROLOGICAL:  No dizziness, headaches, blackouts, or seizures.  MUSCULOSKELETAL:  Arthritis.  PSYCHIATRIC:  No depression or nervousness.  EYE/ENT:  No changes in eyesight or hearing.  HEMATOLOGICAL:  No problems with bleeding, clotting disorders, or  anemia.   PHYSICAL EXAMINATION:  General:  He is a well-developed Caucasian male,  in no acute distress.  Vital Signs:  His blood pressure was 152/80,  pulse 80, respirations 18, and sats were 94%.  Head, Eyes, Ears, Nose,  and Throat:  Unremarkable.  Neck:  Supple without thyromegaly.  There is  no supraclavicular or axillary adenopathy.  Chest:  Clear to  auscultation and percussion.  Heart:  Regular, sinus rhythm.  No  murmurs.  Abdomen:  Soft.  There is no hepatosplenomegaly.  Extremities:  Pulses are 2+.  There is no clubbing or edema.  Neurological:  He is  oriented x3.  Sensory and motor intact.  Cranial nerves intact.   I feel the best treatment for this 8-mm nodule is resection via the VATS  approach.  I discussed this with the patient and he agrees with surgery.  We plan to do this on September 04, 2010, at [facility].  We will let  you know when he is admitted.  I appreciate the opportunity of seeing  the patient.   Sincerely,   Ines Bloomer, M.D.  Electronically Signed   DPB/MEDQ  D:  08/20/2010  T:  08/21/2010  Job:  [id number]

## 2011-02-03 NOTE — Letter (Signed)
September 17, 2010   Kalman Shan, MD  [address] [place] [address]  [place] [address]   Re:  CHET, GREENLEY                DOB:  11-01-1950   Dear Carmin Muskrat,   I saw the patient back today.  He is doing well after his left upper  lobe resection of a stage IA non-small cell lung cancer with squamous  differentiation.  He is doing well overall.  His incision has well-  healed.  I have told him to increase him to full activity.  His blood  pressure is 142/75, pulse 55, respirations 16, sats were 95%.  I got him  appointment to see Dr. Gilman Buttner for Oncology evaluation.  I planned to  see him back again in 3 weeks with a chest x-ray.   Ines Bloomer, M.D.  Electronically Signed   DPB/MEDQ  D:  09/17/2010  T:  09/18/2010  Job:  [id number]   cc:   Verl Dicker, M.D.  Dellia Beckwith, M.D.

## 2011-02-03 NOTE — Consult Note (Signed)
[name], BRANN NO.:  [id number]   MEDICAL RECORD NO.:  [id number]          PATIENT TYPE:  INP   LOCATION:  2113                         FACILITY:  [facility]   PHYSICIAN:  Lajuana Matte, MD  DATE OF BIRTH:  04/28/1951   DATE OF CONSULTATION:  05/09/2008  DATE OF DISCHARGE:                                 CONSULTATION   CONSULTING PHYSICIAN:  Lajuana Matte, M.D.   REASON FOR CONSULTATION:  Polycythemia vera.   REFERRING PHYSICIAN:  Critical care medicine.   HISTORY OF PRESENT ILLNESS:  Mr. Hammonds is a pleasant 60 year old white  male who was admitted with VDRF due to COPD and pulmonary hypertension.  The patient was found to have elevated level of hemoglobin and  hematocrit at 19.4 and 61.6 respectively during admission.  He underwent  phlebotomy, but these levels are still elevated.  Currently, his values  are 18 and 58.3 respectively.  His white count and platelets are  currently normal.  Dr. Molli Knock kindly asked Dr. Arbutus Ped to evaluate the  patient today for this problem.  He is currently intubated but  responsive and able to answer questions by nodding.  He is feeling  better today.   PAST MEDICAL HISTORY:  1. Acute respiratory distress, requiring ventilation during this      admission.  2. COPD.  3. Pulmonary hypertension during this admission.  4. Hypothyroidism.  5. Hypertension.  6. Remote tobacco use, quitting 7 years ago.  7. Alcohol habituation with prior alcohol intake being heavier than      currently.  8. Right hard of hearing.  9. Vitiligo.  10.Rosacea.  11.Situational depression.  12.Pneumonia of the right middle lobe during this admission.  13.Mild CHF, diastolic.   SURGERIES:  Status post appendectomy, status post back surgery, status  post cardiac catheterization on May 04, 2008, Dr. Riley Kill.   ALLERGIES:  NKDA.   MEDICATIONS:  Synthroid, Rocephin, Protonix, Zithromax, NovoLog,  Ventolin, Atrovent nebulizer, Lasix,  heparin per pharmacy, Reglan,  Flagyl, Versed, fentanyl, Zofran, Tylenol.   REVIEW OF SYSTEMS:  These review of systems are as per patient nodding  and also with the help of his wife, who was able to verbalize the  patient's symptoms.  He has been having chills for about 2 weeks prior  to admission.  He has had occasional migraines over the last 3 years.  No vision changes.  No vertigo.  He has dyspnea on exertion and  shortness of breath along with a productive cough.  No chest pain at  this time.  No abdominal pain.  No overt symptoms at this time.  He has  been having diarrhea since admission, which is being monitored and  placed on C. difficile  toxin precautions.  He denies blood in the  stools.  Nocturia.  He has arthralgia of the knees and elbows.  He also  has edema.  In addition, on admission, he expressed an 8-pound weight  loss and significant fatigue.  No eating when showering.  The rest of  the review of systems is negative.   FAMILY HISTORY:  Mother alive at.   SOCIAL HISTORY:  The patient is married.  He has 1 daughter who is  present at this time with him.  He smoked up to 3 packs a day for at  least 30+ years, quitting in 2002.  He drinks a 6-pack a day.  Prior to  that, he used to drink heavier amounts.  He is a Naval architect, has been  recently laid off of work.  Lives in [place], [place].   PHYSICAL EXAMINATION:  GENERAL:  This is an obese 60 year old white male  in no acute distress, intubated, but interactive.  VITAL SIGNS:  Blood pressure 119/47, pulse 71, respirations 20, pulse  oximetry 95% intubated.  Weight 113 kilograms.  Height 72 inches.  HEENT:  Remarkable for red facies.  He is intubated at this time;  therefore, the oral cavity cannot be evaluated.  NECK:  Supple.  No cervical or supraclavicular masses.  There is a left  central line with swollen tissue round it.  LUNGS:  Distant lung sounds, but good air exchange.  No apparent rhonchi  or  rales.  No axillary masses.  This exam has been performed anteriorly.  CARDIOVASCULAR:  Regular rate and rhythm without murmurs, rubs or  gallops.  ABDOMEN:  Obese, distended.  Limited exam due to the distention, but no  appreciated hepatosplenomegaly at this time.  EXTREMITIES:  No clubbing  or cyanosis.  There is 2+ edema bilaterally.  No inguinal masses.  SKIN:  Without bruising or petechial rash.  He does have vitiligo,  especially in his hands, as well as reddish coloration throughout the  body.  Her wife report, this has been present for at least 3 years.  GENITOURINARY/RECTAL:  Deferred.  MUSCULOSKELETAL:  Essentially negative.  NEUROLOGIC:  Limited exam, but the patient moves all extremities and  follows all commands.   LABORATORIES:  Hemoglobin 18, hematocrit 58.3, white count 9.2,  platelets 302, MCV 98.5.  All the iron studies and LDH and ferritin are  currently pending.  D-dimer 1.55.  Troponin 0.11.  PTT 27, PT 13.5, INR  1.0.  Sed rate 1.  Sodium 145, potassium 4.9, BUN 39, creatinine 1.31,  glucose 141, total bilirubin 0.8, alkaline phosphatase 50, AST 18, ALT  14, total  protein 6.1, albumin 2.6, calcium 9.0.  TSH 3.625.  Magnesium  2.4.  Erythropoietin is pending.  Bronchial alveolar lavage cultures  show gram-positive cocci.  Blood culture is negative.  Chest x-ray on  August 19 showed right upper lobe air space disease, prior empyema  versus hemothorax.  Abdominal x-ray on May 08, 2008, is negative  except for the feeding tube.  A CT angio of the chest on May 02, 2008, is negative for PE.  He has pulmonary arterial hypertension, right  middle lobe pneumonia, and reactive mediastinal, right hilar  lymphadenopathy.   ASSESSMENT/PLAN:  Dr. Arbutus Ped has seen and evaluated the patient and  reviewed the chart.  This patient has likely polycythemia, possibly  reactive secondary to lung history and smoking, COPD and pulmonary  hypertension, but cannot completely  exclude polycythemia vera.   The plan is:  1. Check the erythropoietin level, LDH, iron studies and ferritin.  2. Check the ultrasound of the abdomen to rule out splenomegaly.  3. Continue phlebotomy once or twice weekly until his hematocrit is      45% and then as needed.  4. Please arrange the patient for a followup visit with Dr. Arbutus Ped  after discharge.  5. Start daily aspirin at 325 mg daily.   Thank you very much for allowing [place] the opportunity to participate in  the care of this nice patient.      Marlowe Kays, P.A.      Lajuana Matte, MD  Electronically Signed    SW/MEDQ  D:  05/10/2008  T:  05/10/2008  Job:  [id number]   cc:   Dr. Quintella Reichert

## 2011-02-03 NOTE — Letter (Signed)
October 07, 2010   Kalman Shan, MD  [address] [place] [address]  [place], [place] [address]   Re:  [name], [name]                DOB:  01/29/1951   The patient returns today after being admitted for right lower lobe  pneumonia.  His chest x-ray today showed this is clearing out and he has  finished his Percocet.  His sats were 94%.  His blood pressure is  136/76, pulse 60.  I will release him to full activity.  We will see him  back again in about 3 weeks.  At that time, we will probably need to  release him to full activity.   Ines Bloomer, M.D.  Electronically Signed   DPB/MEDQ  D:  10/07/2010  T:  10/08/2010  Job:  [id number]

## 2011-02-03 NOTE — Consult Note (Signed)
NAMECOUNCIL, MUNGUIA NO.:  [id number]   MEDICAL RECORD NO.:  [id number]          PATIENT TYPE:  INP   LOCATION:  1826                         FACILITY:  [facility]   PHYSICIAN:  Rollene Rotunda, MD, FACCDATE OF BIRTH:  09-22-1950   DATE OF CONSULTATION:  05/01/2008  DATE OF DISCHARGE:                                 CONSULTATION   HISTORY:  Mr. Fallert is a 60 year old white male who presents to [facility] Emergency Room with multiple complaints.  Currently, the patient is  on bypass due to respiratory distress and the wife provides the history.  According to his wife, approximately 2 months ago, the patient was doing  anything that he wanted to do without limitation, but at least over the  last month she has noticed a decreased energy, appetite, and some  dyspnea on exertion.  Particularly, in the last week, he has developed  periods of confusion, he falls asleep easily, progressive dyspnea on  exertion with decreased provocation, orthopnea, PND, and abdominal  edema.  She said that her husband followed with primary care physician  last Thursday and was prescribed Cialis 5 mg p.r.n., received a  testosterone shot and his levothyroxine was increased.  She also saw his  primary care physician yesterday for increasing problems related to the  above, and an echocardiogram and a chest x-ray were scheduled for next  Monday.  Due to increasing problems with confusion and shortness of  breath, she brought him to [facility] Emergency Room for further  evaluation.   PAST MEDICAL HISTORY:  No known drug allergies.   Medications prior to admission include:  1. Levothyroxine 300 mcg daily.  2. Bisoprolol and hydrochlorothiazide 2.5 mg/6.25 mg daily.  3. Cialis 5 mg p.r.n.  4. Testosterone 1 mL IM every 2 weeks and p.r.n. ibuprofen.   His past medical history is notable for hypothyroidism, unknown last TSH  check, hypertension, and hospitalization for pneumonia 7 years ago.  Surgical history includes appendectomy and some type of back surgery.  Specifically, wife denies any history of known diabetes, CVA, myocardial  infarction, COPD, bleeding dyscrasias, or kidney dysfunction.   SOCIAL HISTORY:  The patient resides in [place] with his wife.  They  have one daughter and one granddaughter.  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  He has not  smoked in 7 years.  Prior to that, he was smoking at least three packs  per day for unknown duration.  The patient's wife states that he has had  a history of heavy alcohol use, but denies actually alcoholism or  problems with withdrawal.  Currently, he drinks approximately a six-pack  a day.  She denies any known drug or herbal medication use.  The patient  does not exercise nor does he follow a specific diet.   FAMILY HISTORY:  Mother is alive and well at the age of 53.  His father  died in his 32s with some type of heart problems.  He has three sisters  that are living and wife feels that  one of his sister has some type of  lung problem.   REVIEW OF SYSTEMS:  Wife feels that the patient has had some chills in  the last couple of weeks.  She also has noted at least 8-pound weight  gain over the preceding 2 weeks.  He does wear glasses, has hearing loss  in the right ear, and occasional headaches.  He has a history of  vitiligo and rosacea as well as a cough with nonspecific production,  nocturia, generalized weakness, depression/anxiety associated with  stress due to unemployment, and arthralgias in his knee and elbows.  All  other systems are unremarkable.   PHYSICAL EXAMINATION:  VITAL SIGNS:  Temperature was 97.5, pulse 97,  respirations 60, and blood pressure 130/75 with a 70% sats on room air  on presentation.  Currently, his blood pressure is 162/77, pulse is 88,  respirations are 28, and he is on BiPAP.  On BiPAP sats are now  approximately 94%.  GENERAL:   Well-nourished and well-developed obese white male in moderate  respiratory distress.  HEENT:  Grossly unremarkable, however, sclerae are injected and he does  have two missing teeth.  NECK:  Supple without thyromegaly, adenopathy, or carotid bruits.  Difficult to assess his any JVD given his neck fullness.  CHEST:  Symmetrical excursion.  LUNGS:  Sounds are severely diminished.  She does not have any apparent  wheezing.  HEART:  Regular rate and rhythm with normal S1 and S2.  He does not have  an S3 or S4.  Do not appreciate any murmurs, rubs, clicks, or gallops.  Pulses appear to be symmetrical and intact.  SKIN:  Integument appears be intact.  He does have some venous stasis  insufficiency changes as well as areas of hypopigmentation.  ABDOMEN:  Obese.  Bowel sounds present without organomegaly, masses, or  tenderness.  EXTREMITIES:  Positive cyanosis with decreased capillary refill with  diffuse edema in his lower extremities up to the knees, also associated  with chronic venous stasis changes.  MUSCULOSKELETAL:  Grossly unremarkable.  NEURO:  He appears to follow commands without difficulty.   Chest x-ray shows cardiomegaly and CHF.  EKG shows normal sinus rhythm,  right axis deviation, RVH, PACs with delayed R-wave progression.  EKG  leads are hooked up correctly and there are no old EKGs to compare to.  H and H is 19.4 and 61.6, normal indices, platelets 150, WBC 6.1.  Sodium 135, potassium 4.9, BUN 26, creatinine 1.0, glucose 114.  BNP  700.  Point of care marker unremarkable x1.  CK-MB are within normal  limits.  Troponin slightly elevated at 0.11.  Initial ABG showed a pH of  7.2, CO2 of 99, pO2 of 212 with a bicarb of 38.7 on 50% BiPAP.   IMPRESSION:  1. Respiratory failure with a mixed picture and probable history of      acute on chronic respiratory failure with acute on chronic lung      disease.  2. Small component of congestive heart failure.  3. Hypertension.  4.  Elevated hemoglobin and hematocrit which also indicates chronic      hypoxic state.  5. History of hypothyroidism.  6. Remote tobacco use.  7. Daily alcohol intake.  8. History as previously.   DISPOSITION:  Dr. Antoine Poche reviewed the patient's history, spoke with  and examined the patient.  We have contacted critical care management  and they have assessed the patient and will admit to their service.  They do  not feel he needs to be intubated at this time.  We will obtain  a stat echocardiogram as well as check a D-dimer and we will continue to  follow.  Further recommendations will be based on response to treatment  and findings.      Joellyn Rued, PA-C      Rollene Rotunda, MD, [facility]  Electronically Signed    EW/MEDQ  D:  05/01/2008  T:  05/01/2008  Job:  [id number]   cc:   Dr. Vicente Serene B. Sherene Sires, MD, FCCP

## 2011-04-14 ENCOUNTER — Encounter: Payer: Self-pay | Admitting: Internal Medicine

## 2011-04-17 ENCOUNTER — Ambulatory Visit (INDEPENDENT_AMBULATORY_CARE_PROVIDER_SITE_OTHER): Payer: BC Managed Care – PPO | Admitting: Internal Medicine

## 2011-04-17 ENCOUNTER — Encounter: Payer: Self-pay | Admitting: Internal Medicine

## 2011-04-17 VITALS — BP 104/56 | HR 46 | Temp 98.0°F | Ht 72.0 in | Wt 223.6 lb

## 2011-04-17 DIAGNOSIS — J449 Chronic obstructive pulmonary disease, unspecified: Secondary | ICD-10-CM

## 2011-04-17 DIAGNOSIS — C3490 Malignant neoplasm of unspecified part of unspecified bronchus or lung: Secondary | ICD-10-CM

## 2011-04-17 DIAGNOSIS — J189 Pneumonia, unspecified organism: Secondary | ICD-10-CM

## 2011-04-17 NOTE — Assessment & Plan Note (Signed)
LUL stage 1 A sq cell - spl wedge resection dec 2011. Per hx July Ct chest in [place] is normal  Plan Repeat 1 year ct chest dec 2012. Advised i I could do surveillance and he is willin to switch to me for surveillance to simplify care provided oncoloigist is not doing anything else for him. I will query oncology records and advise him

## 2011-04-17 NOTE — Patient Instructions (Addendum)
#  COPD - disease is stable  - continue spiriva #Swalllowing  - follow speech therapy guidelines #lung cancer  - you need repeat CT chest dec 2012 - I can do it - If you are seeing cancer doctor for surveillance alone, I can do it - Please give my nurse cancer doctor name and number, so I can send a note theree and also get the note - Based on what I see in the cancer doctor note I will advise if you need to go there in October #Followup - dec 2012 with CT chest wihtout contrast for cancer surveilance

## 2011-04-17 NOTE — Assessment & Plan Note (Signed)
Stable gold stage 2 cOpd.   Plan continjue spiriva

## 2011-04-17 NOTE — Assessment & Plan Note (Signed)
Cleared on cxr April 2012. No further problems. Per hx dysphagia is resolved.   Plan Monitor Diet per speech

## 2011-04-17 NOTE — Progress Notes (Signed)
Subjective:    Patient ID: [name], male    DOB: 1951-08-27, 60 y.o.   MRN: [id number]  HPI  60 year old male. Ex-smoker. Ex-etoh. [facility] patient since august 2009 when admitted to ICU for STrep PNA bilaeral LL consolidation and s/p APRV. Follows for Gold stage 2 COPD MM genotype. S/p LUL wedge resection for stage 1Asquamous cell lung cancer [name]) December 2011. Also, Recurrent RML and RLL pna due to dysphagia/aspoiration in Jan and Feb 2012 with subsequent clearance April 2012 cxr  OV 04/17/2011: Followup for COPD, recurrent pneumonia and lung cancer. Doing well. 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 Reports stable health. No dysphagia. STates speech cleared him to eat normally. Denies dyspnea or cough or chest pain. Very active. Doing work. No weight loss. States he is seeing a cancer doctor in [place] for post lung cancer surgery surveillance. Had CT chest last wek reportedly (6th month CT) and this was normal per report.   Past Medical History  Diagnosis Date  . Streptococcal pneumonia August 2009  . Congestive heart failure   . Gastrointestinal obstruction     Ileus  . Pulmonary hypertension   . Polycythemia   . Hypothyroid   . COPD (chronic obstructive pulmonary disease)   . Obesity   . Lung cancer      Family History  Problem Relation Age of Onset  . Heart disease Father      History   Social History  . Marital Status: Married    Spouse Name: N/A    Number of Children: N/A  . Years of Education: N/A   Occupational History  . truck driver    Social History Main Topics  . Smoking status: Former Smoker -- 3.0 packs/day  . Smokeless tobacco: Not on file  . Alcohol Use: Not on file  . Drug Use: Not on file  . Sexually Active: Not on file   Other Topics Concern  . Not on file   Social History Narrative   Resides in [place] with his wife.  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      Allergies not on file   Outpatient Prescriptions Prior to Visit  Medication Sig Dispense Refill  . aspirin 325 MG tablet Take 325 mg by mouth daily.        . bisoprolol-hydrochlorothiazide (ZIAC) 2.5-6.25 MG per tablet Take 1 tablet by mouth daily.        [name] levothyroxine (SYNTHROID, LEVOTHROID) 200 MCG tablet Take 250 mcg by mouth daily.        [name] tiotropium (SPIRIVA) 18 MCG inhalation capsule Place 18 mcg into inhaler and inhale daily.            Review of Systems  Constitutional: Negative for fever and unexpected weight change.  HENT: Negative for ear pain, nosebleeds, congestion, sore throat, rhinorrhea, sneezing, trouble swallowing, dental problem, postnasal drip and sinus pressure.   Eyes: Negative for redness and itching.  Respiratory: Negative for cough, chest tightness, shortness of breath and wheezing.   Cardiovascular: Negative for palpitations and leg swelling.  Gastrointestinal: Negative for nausea and vomiting.  Genitourinary: Negative for dysuria.  Musculoskeletal: Negative for joint swelling.  Skin: Negative for rash.  Neurological: Negative for headaches.  Hematological: Does not bruise/bleed easily.  Psychiatric/Behavioral: Negative for dysphoric mood. The patient is not nervous/anxious.        Objective:   Physical Exam  Nursing note and vitals reviewed. Constitutional: He  is oriented to person, place, and time. He appears well-developed and well-nourished. No distress.       overweight  HENT:  Head: Normocephalic and atraumatic.  Right Ear: External ear normal.  Left Ear: External ear normal.  Mouth/Throat: Oropharynx is clear and moist. No oropharyngeal exudate.  Eyes: Conjunctivae and EOM are normal. Pupils are equal, round, and reactive to light. Right eye exhibits no discharge. Left eye exhibits no discharge. No scleral icterus.  Neck: Normal range of motion. Neck supple. No JVD present. No tracheal deviation present. No thyromegaly present.   Cardiovascular: Normal rate, regular rhythm and intact distal pulses.  Exam reveals no gallop and no friction rub.   No murmur heard. Pulmonary/Chest: Effort normal and breath sounds normal. No respiratory distress. He has no wheezes. He has no rales. He exhibits no tenderness.  Abdominal: Soft. Bowel sounds are normal. He exhibits no distension and no mass. There is no tenderness. There is no rebound and no guarding.  Musculoskeletal: Normal range of motion. He exhibits no edema and no tenderness.  Lymphadenopathy:    He has no cervical adenopathy.  Neurological: He is alert and oriented to person, place, and time. He has normal reflexes. No cranial nerve deficit. Coordination normal.  Skin: Skin is warm and dry. No rash noted. He is not diaphoretic. There is erythema. No pallor.       Grade 1 skin burn due to sun on chest and back  Psychiatric: He has a normal mood and affect. His behavior is normal. Judgment and thought content normal.          Assessment & Plan:   No problem-specific assessment & plan notes found for this encounter.

## 2011-06-19 LAB — CBC
HCT: 54.5 — ABNORMAL HIGH
HCT: 57 — ABNORMAL HIGH
HCT: 60.4 — ABNORMAL HIGH
HCT: 60.8 — ABNORMAL HIGH
Hemoglobin: 17.2 — ABNORMAL HIGH
Hemoglobin: 17.9 — ABNORMAL HIGH
Hemoglobin: 18.9 — ABNORMAL HIGH
Hemoglobin: 19.4 — ABNORMAL HIGH
MCHC: 31.4
MCHC: 31.4
MCHC: 31.5
MCHC: 31.8
MCV: 97
MCV: 98.1
MCV: 99.1
MCV: 99.1
Platelets: 117 — ABNORMAL LOW
Platelets: 129 — ABNORMAL LOW
Platelets: 142 — ABNORMAL LOW
Platelets: 150
RBC: 5.56
RBC: 5.75
RBC: 6.09 — ABNORMAL HIGH
RBC: 6.22 — ABNORMAL HIGH
RBC: 6.26 — ABNORMAL HIGH
RDW: 18.2 — ABNORMAL HIGH
RDW: 18.3 — ABNORMAL HIGH
RDW: 18.3 — ABNORMAL HIGH
RDW: 18.7 — ABNORMAL HIGH
WBC: 10.3
WBC: 11.1 — ABNORMAL HIGH
WBC: 6.1
WBC: 6.7

## 2011-06-19 LAB — POCT I-STAT 3, ART BLOOD GAS (G3+)
Acid-Base Excess: 10 — ABNORMAL HIGH
Acid-Base Excess: 2
Acid-Base Excess: 8 — ABNORMAL HIGH
Bicarbonate: 36.1 — ABNORMAL HIGH
Bicarbonate: 38.5 — ABNORMAL HIGH
O2 Saturation: 93
O2 Saturation: 94
O2 Saturation: 94
O2 Saturation: 98
O2 Saturation: 99
Patient temperature: 99.3
TCO2: 36
TCO2: 40
TCO2: 42
pCO2 arterial: 109.4
pCO2 arterial: 58.4
pCO2 arterial: 60.7
pCO2 arterial: 60.8
pCO2 arterial: 66.7
pCO2 arterial: 99
pH, Arterial: 7.317 — ABNORMAL LOW
pH, Arterial: 7.399
pH, Arterial: 7.412
pH, Arterial: 7.416
pO2, Arterial: 111 — ABNORMAL HIGH
pO2, Arterial: 212 — ABNORMAL HIGH
pO2, Arterial: 69 — ABNORMAL LOW
pO2, Arterial: 73 — ABNORMAL LOW
pO2, Arterial: 98

## 2011-06-19 LAB — BLOOD GAS, ARTERIAL
Acid-Base Excess: 9 — ABNORMAL HIGH
Acid-Base Excess: 9.3 — ABNORMAL HIGH
Bicarbonate: 33 — ABNORMAL HIGH
Bicarbonate: 34.2 — ABNORMAL HIGH
Bicarbonate: 35.2 — ABNORMAL HIGH
Drawn by: 24486
FIO2: 1
FIO2: 100
MECHVT: 550
MECHVT: 600
O2 Saturation: 87.7
O2 Saturation: 90
O2 Saturation: 99.1
PEEP: 16
PEEP: 5
Patient temperature: 98.6
Patient temperature: 98.6
Patient temperature: 98.6
RATE: 14
RATE: 16
TCO2: 34.8
TCO2: 36.1
TCO2: 37.3
pCO2 arterial: 59.5
pCO2 arterial: 67.9
pH, Arterial: 7.335 — ABNORMAL LOW
pH, Arterial: 7.378
pO2, Arterial: 142 — ABNORMAL HIGH
pO2, Arterial: 57 — ABNORMAL LOW
pO2, Arterial: 61.6 — ABNORMAL LOW

## 2011-06-19 LAB — B-NATRIURETIC PEPTIDE (CONVERTED LAB)
Pro B Natriuretic peptide (BNP): 150 — ABNORMAL HIGH
Pro B Natriuretic peptide (BNP): 164 — ABNORMAL HIGH
Pro B Natriuretic peptide (BNP): 700 — ABNORMAL HIGH

## 2011-06-19 LAB — BASIC METABOLIC PANEL
BUN: 23
BUN: 26 — ABNORMAL HIGH
Calcium: 8.1 — ABNORMAL LOW
Calcium: 8.6
Chloride: 97
Creatinine, Ser: 1
Creatinine, Ser: 1.08
Creatinine, Ser: 1.16
GFR calc Af Amer: >60
GFR calc Af Amer: >60
GFR calc Af Amer: >60
GFR calc non Af Amer: >60
GFR calc non Af Amer: >60
Potassium: 4.9

## 2011-06-19 LAB — TSH: TSH: 3.625

## 2011-06-19 LAB — DIFFERENTIAL
Basophils Relative: 1
Eosinophils Absolute: 0
Eosinophils Relative: 1
Lymphs Abs: 0.7
Monocytes Absolute: 0.6
Monocytes Relative: 10

## 2011-06-19 LAB — CULTURE, BLOOD (ROUTINE X 2)
Culture: NO GROWTH
Culture: NO GROWTH

## 2011-06-19 LAB — GLUCOSE, CAPILLARY
Glucose-Capillary: 114 — ABNORMAL HIGH
Glucose-Capillary: 121 — ABNORMAL HIGH
Glucose-Capillary: 125 — ABNORMAL HIGH
Glucose-Capillary: 131 — ABNORMAL HIGH
Glucose-Capillary: 156 — ABNORMAL HIGH
Glucose-Capillary: 156 — ABNORMAL HIGH
Glucose-Capillary: 168 — ABNORMAL HIGH
Glucose-Capillary: 169 — ABNORMAL HIGH

## 2011-06-19 LAB — BASIC METABOLIC PANEL WITH GFR
BUN: 15
CO2: 35 — ABNORMAL HIGH
Calcium: 8.1 — ABNORMAL LOW
Chloride: 100
Creatinine, Ser: 0.75
GFR calc non Af Amer: >60
Glucose, Bld: 153 — ABNORMAL HIGH
Potassium: 3.6
Sodium: 142

## 2011-06-19 LAB — CARDIAC PANEL(CRET KIN+CKTOT+MB+TROPI)
CK, MB: 4
CK, MB: 6.7 — ABNORMAL HIGH
Relative Index: INVALID
Relative Index: INVALID
Relative Index: INVALID
Total CK: 33
Total CK: 41
Troponin I: 0.1 — ABNORMAL HIGH
Troponin I: 0.18 — ABNORMAL HIGH

## 2011-06-19 LAB — PROTIME-INR
INR: 1
Prothrombin Time: 13.5

## 2011-06-19 LAB — POCT I-STAT 3, VENOUS BLOOD GAS (G3P V)
Acid-Base Excess: 5 — ABNORMAL HIGH
O2 Saturation: 75
TCO2: 34
TCO2: 35
pCO2, Ven: 56.7 — ABNORMAL HIGH
pH, Ven: 7.395 — ABNORMAL HIGH

## 2011-06-19 LAB — COMPREHENSIVE METABOLIC PANEL
ALT: 17
AST: 20
Albumin: 3.1 — ABNORMAL LOW
Chloride: 94 — ABNORMAL LOW
Creatinine, Ser: 0.87
GFR calc Af Amer: >60
Potassium: 4.7
Sodium: 136
Total Bilirubin: 1.7 — ABNORMAL HIGH

## 2011-06-19 LAB — CARBOXYHEMOGLOBIN
Carboxyhemoglobin: 1.4
Methemoglobin: 0.5
O2 Saturation: 86.8
Total hemoglobin: 17.9

## 2011-06-19 LAB — POCT CARDIAC MARKERS
CKMB, poc: <1 — ABNORMAL LOW
Myoglobin, poc: 33.8

## 2011-06-19 LAB — CROSSMATCH: Antibody Screen: NEGATIVE

## 2011-06-19 LAB — CULTURE, BAL-QUANTITATIVE W GRAM STAIN

## 2011-06-19 LAB — T4, FREE: Free T4: 1.09

## 2011-06-19 LAB — CK TOTAL AND CKMB (NOT AT ARMC): Total CK: 49

## 2011-06-19 LAB — D-DIMER, QUANTITATIVE: D-Dimer, Quant: 1.55 — ABNORMAL HIGH

## 2011-06-19 LAB — MAGNESIUM: Magnesium: 1.4 — ABNORMAL LOW

## 2011-06-19 LAB — SEDIMENTATION RATE: Sed Rate: 1

## 2011-09-16 ENCOUNTER — Ambulatory Visit: Payer: BC Managed Care – PPO | Admitting: Internal Medicine

## 2011-09-17 ENCOUNTER — Ambulatory Visit (INDEPENDENT_AMBULATORY_CARE_PROVIDER_SITE_OTHER)
Admission: RE | Admit: 2011-09-17 | Discharge: 2011-09-17 | Disposition: A | Discharge status: Home / Self Care | Payer: BC Managed Care – PPO | Source: Ambulatory Visit | Attending: Internal Medicine | Admitting: Internal Medicine

## 2011-09-17 ENCOUNTER — Ambulatory Visit: Payer: BC Managed Care – PPO | Admitting: Internal Medicine

## 2011-09-17 DIAGNOSIS — C3490 Malignant neoplasm of unspecified part of unspecified bronchus or lung: Secondary | ICD-10-CM

## 2011-09-30 ENCOUNTER — Ambulatory Visit (INDEPENDENT_AMBULATORY_CARE_PROVIDER_SITE_OTHER): Payer: BC Managed Care – PPO | Admitting: Internal Medicine

## 2011-09-30 ENCOUNTER — Encounter: Payer: Self-pay | Admitting: Internal Medicine

## 2011-09-30 DIAGNOSIS — J180 Bronchopneumonia, unspecified organism: Secondary | ICD-10-CM

## 2011-09-30 DIAGNOSIS — J449 Chronic obstructive pulmonary disease, unspecified: Secondary | ICD-10-CM

## 2011-09-30 DIAGNOSIS — J984 Other disorders of lung: Secondary | ICD-10-CM

## 2011-09-30 DIAGNOSIS — R0982 Postnasal drip: Secondary | ICD-10-CM

## 2011-09-30 DIAGNOSIS — C3490 Malignant neoplasm of unspecified part of unspecified bronchus or lung: Secondary | ICD-10-CM

## 2011-09-30 NOTE — Progress Notes (Signed)
  Subjective:    Patient ID: [name], male    DOB: Jan 23, 1951, 61 y.o.   MRN: [id number]  HPI    Review of Systems  Constitutional: Negative for fever and unexpected weight change.  HENT: Positive for rhinorrhea, sneezing, postnasal drip and sinus pressure. Negative for ear pain, nosebleeds, congestion, sore throat, trouble swallowing and dental problem.   Eyes: Positive for redness and itching.  Respiratory: Negative for cough, chest tightness, shortness of breath and wheezing.   Cardiovascular: Negative for palpitations and leg swelling.  Gastrointestinal: Negative for nausea and vomiting.  Genitourinary: Positive for difficulty urinating. Negative for dysuria.  Musculoskeletal: Positive for joint swelling.  Skin: Negative for rash.  Neurological: Negative for headaches.  Hematological: Bruises/bleeds easily.  Psychiatric/Behavioral: Negative for dysphoric mood. The patient is not nervous/anxious.        Objective:   Physical Exam        Assessment & Plan:

## 2011-09-30 NOTE — Patient Instructions (Signed)
#  COPD - disease is stable  - continue spiriva - nurse will check alpha 1 anti-trypsin genetic test #Swalllowing  - follow speech therapy guidelines #lung cancer  - I will talk to Dr Danton Sewer about lung cancer surveillance - if she is ok with it I will do it #Nasal drainage  - start generic flonase 2 squirts into each nostril daily #Followup  - 6 months for copd - do spirometry and walk test at followup -  dec 2013 with CT chest wihtout contrast for cancer surveilance

## 2011-09-30 NOTE — Progress Notes (Signed)
Subjective:    Patient ID: [name], male    DOB: [birth year], 61 y.o.   MRN: [id number]  HPI   61 year old male. Ex-smoker. Ex-etoh. [name] patient since august 2009  - admitted to ICU for STrep PNA bilaeral LL consolidation and s/p APRV summer 2009 -  Gold stage 2 COPD MM genotype.  - S/p LUL wedge resection for stage 1Asquamous cell lung cancer [name]) December 2011.  - Recurrent RML and RLL pna due to dysphagia/aspoiration in Jan and Feb 2012 with subsequent clearance April 2012 cxr, Dec 2012 CT  OV 04/17/2011: Followup for COPD, recurrent pneumonia and lung cancer. Doing well. 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 Reports stable health. No dysphagia. STates speech cleared him to eat normally. Denies dyspnea or cough or chest pain. Very active. Doing work. No weight loss. States he is seeing a cancer doctor in [place] for post lung cancer surgery surveillance. Had CT chest last wek reportedly (6th month CT) and this was normal per report.   OV 09/30/2011 Followup for COPD, recurrent pneumonia due to dysphagia, hospitalizatin, and lung cancer surveillance. Feels well. No complaints. Compliant with medications. No dysphagia. Wondering why hee has to do cancer surveillance in  and wondering if he can consolidate it here. Denies dyspnea, cough, chest pain, sputum. Only issue is persistent post nasal drip: not using medications  Had CT Ct Chest Wo Contrast 09/17/11 (see detailed rport below). Overall clearance of pneumonia since Jan 2012. [name] is a residual RUL density and lingular density that to me is stable since Sept 2011 (15 month stability). I confirmed this with radiologist on call at day Dr Jeronimo Greaves and feel that next CT can be done in 1 year    09/17/2011  *RADIOLOGY REPORT*  Clinical Data: Left lung cancer, history of left upper lobectomy December 2011.  Restaging.  CT CHEST WITHOUT CONTRAST  Technique:  Multidetector CT imaging of the chest was performed  following the standard protocol without IV contrast.  Comparison: Chest radiograph 01/06/2011 at [facility] location, chest CT 10/19/2010 at [facility]  Findings: Unenhanced CT was performed per clinician order.  Lack of IV contrast limits sensitivity and specificity.  Interval decrease in previously seen enlarged lymph nodes, with representative AP window lymph node now measuring 0.7 cm image 25, previously 1.1 cm at the same anatomic level.  Pretracheal previously measured lymph node now measures 0.5 cm in short axis diameter image 24, previously 1.0 cm at the same anatomic level. No new lymphadenopathy.  Atherosclerotic aortic calcification noted without aneurysm.  Coronary arterial calcification again noted, moderate.  Trace pericardial fluid is new.  No pleural effusion. Calcified right basilar pleural plaque again noted.  Minimal patchy predominately right upper lobe tree in bud nodular opacity is noted.  Overall, the other areas of patchy airspace nodular opacity have essentially resolved.  There is an area of pleural based wedge-shaped nodularity in the lingula measuring 1.3 cm on image 41.  This area was previously degraded by motion artifact, but is subjectively not likely to be significantly changed.  Evidence of partial left upper lobectomy again noted.  No nodularity or mass is seen along the resection margin.  Central airways are patent.  No new osseous abnormality.  IMPRESSION: Near complete interval resolution of previously seen areas of patchy nodular airspace opacity.  A small area of tree in bud type nodular opacity could reflect small airways infectious etiology in the right upper lobe.  A focal  area of lymphangitic spread of tumor is considered much less likely.  Stable pleural parenchymal nodularity in the lingula.  Interval resolution of previously seen lymphadenopathy, most likely previously reactive.  Original Report Authenticated By: Harrel Lemon, M.D.     Past Medical History  Diagnosis Date  . Streptococcal pneumonia August 2009  . Congestive heart failure   . Gastrointestinal obstruction     Ileus  . Pulmonary hypertension   . Polycythemia   . Hypothyroid   . COPD (chronic obstructive pulmonary disease)   . Obesity   . Lung cancer      Family History  Problem Relation Age of Onset  . Heart disease Father      History   Social History  . Marital Status: Married    Spouse Name: N/A    Number of Children: N/A  . Years of Education: N/A   Occupational History  . truck driver    Social History Main Topics  . Smoking status: Former Smoker -- 3.0 packs/day  . Smokeless tobacco: Not on file  . Alcohol Use: Not on file  . Drug Use: Not on file  . Sexually Active: Not on file   Other Topics Concern  . Not on file   Social History Narrative   Resides in [place] with his wife.  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     No Known Allergies   Outpatient Prescriptions Prior to Visit  Medication Sig Dispense Refill  . amLODipine (NORVASC) 5 MG tablet Take 1 tablet by mouth Daily.      [name] aspirin 325 MG tablet Take 325 mg by mouth daily.        . bisoprolol-hydrochlorothiazide (ZIAC) 2.5-6.25 MG per tablet Take 1 tablet by mouth daily.        [name] levothyroxine (SYNTHROID, LEVOTHROID) 200 MCG tablet Take 250 mcg by mouth daily.        [name] tiotropium (SPIRIVA) 18 MCG inhalation capsule Place 18 mcg into inhaler and inhale daily.        [name] VIAGRA 100 MG tablet as directed.      . testosterone cypionate (DEPOTESTOTERONE CYPIONATE) 200 MG/ML injection as directed.          Review of Systems  Constitutional: Negative for fever and unexpected weight change.  HENT: Negative for ear pain, nosebleeds, congestion, sore throat, rhinorrhea, sneezing, trouble swallowing, dental problem, postnasal drip and sinus pressure.   Eyes: Negative for redness and  itching.  Respiratory: Negative for cough, chest tightness, shortness of breath and wheezing.   Cardiovascular: Negative for palpitations and leg swelling.  Gastrointestinal: Negative for nausea and vomiting.  Genitourinary: Negative for dysuria.  Musculoskeletal: Negative for joint swelling.  Skin: Negative for rash.  Neurological: Negative for headaches.  Hematological: Does not bruise/bleed easily.  Psychiatric/Behavioral: Negative for dysphoric mood. The patient is not nervous/anxious.        Objective:   Physical Exam  Nursing note and vitals reviewed. Constitutional: He is oriented to person, place, and time. He appears well-developed and well-nourished. No distress.       Body mass index is 33.34 kg/(m^2). Body mass index is 33.34 kg/(m^2).    HENT:  Head: Normocephalic and atraumatic.  Right Ear: External ear normal.  Left Ear: External ear normal.  Mouth/Throat: Oropharynx is clear and moist. No oropharyngeal exudate.  Eyes: Conjunctivae and EOM are normal. Pupils are equal, round, and reactive to light. Right  eye exhibits no discharge. Left eye exhibits no discharge. No scleral icterus.  Neck: Normal range of motion. Neck supple. No JVD present. No tracheal deviation present. No thyromegaly present.  Cardiovascular: Normal rate, regular rhythm and intact distal pulses.  Exam reveals no gallop and no friction rub.   No murmur heard. Pulmonary/Chest: Effort normal and breath sounds normal. No respiratory distress. He has no wheezes. He has no rales. He exhibits no tenderness.  Abdominal: Soft. Bowel sounds are normal. He exhibits no distension and no mass. There is no tenderness. There is no rebound and no guarding.  Musculoskeletal: Normal range of motion. He exhibits no edema and no tenderness.  Lymphadenopathy:    He has no cervical adenopathy.  Neurological: He is alert and oriented to person, place, and time. He has normal reflexes. No cranial nerve deficit.  Coordination normal.  Skin: Skin is warm and dry. No rash noted. He is not diaphoretic. No erythema. No pallor.  Psychiatric: He has a normal mood and affect. His behavior is normal. Judgment and thought content normal.          Assessment & Plan:

## 2011-10-04 ENCOUNTER — Telehealth: Payer: Self-pay | Admitting: Internal Medicine

## 2011-10-04 DIAGNOSIS — R0982 Postnasal drip: Secondary | ICD-10-CM

## 2011-10-04 HISTORY — DX: Postnasal drip: R09.82

## 2011-10-04 NOTE — Assessment & Plan Note (Signed)
Dec 2012 Ct chest has RUL and lingula nodules stable since sept 2011. Will do repeat ct dec 2013; 1 year

## 2011-10-04 NOTE — Assessment & Plan Note (Signed)
nassal steroid trial

## 2011-10-04 NOTE — Telephone Encounter (Signed)
[name] Please let him know that he can cancel fu with Dr Danton Sewer oncologist. I have d/w her and she is ok with that.  Thanks MR

## 2011-10-04 NOTE — Assessment & Plan Note (Signed)
Cleared on CT. He is following speech recommendations and reports no further dysphagia. Wil follow expectatnly

## 2011-10-04 NOTE — Assessment & Plan Note (Signed)
In complete remission as of dec 2012. I d/w Dr Danton Sewer oncolgist at [place]. She plans no chemo. She is happy for me to do surveillance at patient request. Therefore, next CT chest dec 2013. Will inform him that he can cancel fu with her

## 2011-10-04 NOTE — Assessment & Plan Note (Signed)
#  COPD - disease is stable  - continue spiriva - ROV 6 months with spirometry and walk test at fu

## 2011-10-13 NOTE — Telephone Encounter (Signed)
Pt aware. Tippi Mccrae, CMA  

## 2011-10-19 ENCOUNTER — Encounter: Payer: Self-pay | Admitting: Cardiovascular Disease

## 2011-10-26 ENCOUNTER — Ambulatory Visit (INDEPENDENT_AMBULATORY_CARE_PROVIDER_SITE_OTHER): Payer: BC Managed Care – PPO | Admitting: Cardiovascular Disease

## 2011-10-26 ENCOUNTER — Encounter: Payer: Self-pay | Admitting: Cardiovascular Disease

## 2011-10-26 ENCOUNTER — Telehealth: Payer: Self-pay | Admitting: *Deleted

## 2011-10-26 ENCOUNTER — Telehealth: Payer: Self-pay | Admitting: Internal Medicine

## 2011-10-26 VITALS — BP 135/86 | HR 82 | Ht 72.0 in | Wt 237.0 lb

## 2011-10-26 DIAGNOSIS — I480 Paroxysmal atrial fibrillation: Secondary | ICD-10-CM | POA: Insufficient documentation

## 2011-10-26 DIAGNOSIS — I4891 Unspecified atrial fibrillation: Secondary | ICD-10-CM | POA: Insufficient documentation

## 2011-10-26 DIAGNOSIS — E039 Hypothyroidism, unspecified: Secondary | ICD-10-CM

## 2011-10-26 HISTORY — DX: Unspecified atrial fibrillation: I48.91

## 2011-10-26 MED ORDER — LEVOTHYROXINE SODIUM 125 MCG PO TABS: 125.0000 ug | ORAL_TABLET | Freq: Every day | ORAL | Status: DC

## 2011-10-26 MED ORDER — DABIGATRAN ETEXILATE MESYLATE 150 MG PO CAPS: 150.0000 mg | ORAL_CAPSULE | Freq: Two times a day (BID) | ORAL | Status: DC

## 2011-10-26 NOTE — Telephone Encounter (Signed)
[name]  He was in [facility] 10/17/11 with pna. Please call and inquire if he is doing ok. Please let him know if he is not getting better to come sooner otherwise keep std fu appt  THanks MR

## 2011-10-26 NOTE — Assessment & Plan Note (Addendum)
[name] presents with asymptomatic atrial fibrillation. His TSH today is very low at 0.042. I suspect that he is over replaced with his Synthroid. We will have him hold his Synthroid for the next 2 days and then decrease his dose down to 125 mcg a day.  His rate is well controlled.  We'll get an echocardiogram for further assessment of his left ventricular function and left atrial size.   We have started him on Pradaxa 150 mg twice a day.  I see him back in the office in one month. We'll check a TSH at that time.

## 2011-10-26 NOTE — Patient Instructions (Signed)
Your physician recommends that you schedule a follow-up appointment in: 1 MONTH/ EKG  Your physician recommends that you return for lab work in: 1 MONTH / TSH- NON FASTING  Your physician has requested that you have an echocardiogram. Echocardiography is a painless test that uses sound waves to create images of your heart. It provides your doctor with information about the size and shape of your heart and how well your heart's chambers and valves are working. This procedure takes approximately one hour. There are no restrictions for this procedure.   Your physician has recommended you make the following change in your medication:   START PRADAXA 150 MG ONE TABLET TWICE A DAY.

## 2011-10-26 NOTE — Telephone Encounter (Signed)
Message copied by Antony Odea on Mon Oct 26, 2011  1:58 PM ------      Message from: Vesta Mixer      Created: Mon Oct 26, 2011  1:48 PM       Please have [name] hold syntroid for 2 days and then decrease it to 125 mcg a day. ( 1/2 present dose)            I'll check again in 1 month

## 2011-10-26 NOTE — Telephone Encounter (Signed)
PT TOLD TO HOLD SYNTHROID X 2 DAYS, THEN REDUCE TO 0.125 MCG, F/U FOR LAB DRAW IN ONE MONTH. PT VERBALIZED UNDERSTANDING.

## 2011-10-26 NOTE — Progress Notes (Signed)
    [name] Date of Birth  05-22-1951 [facility]     [place]  [address]    [address]   [address] [place], [place]  [address]    [place], [place]  [address] [phone]  Fax  [phone]  [phone]  Fax [phone]   Problem List: 1. Atrial Fibrillation 2. Lung Cancer - s/p LUL lobectomy, no chemo or XRT 3. Gout 4. COPD 5.  Hypothyroidism 6.    History of Present Illness:  [name] is a 61 yo with a recent Dx of A-fib.  He is fairly symptomatic. He is able to do all his normal activities without any significant problems. He occasionally has some rapid heart rate and palpitations but mostly he is asymptomatic.  He works as a Therapist, occupational routes.  He has been on antibiotics for a URI recently.  He avoids salt.  He does not get regular exercise.    Current Outpatient Prescriptions on File Prior to Visit  Medication Sig Dispense Refill  . amLODipine (NORVASC) 5 MG tablet Take 1 tablet by mouth Daily.      [name] aspirin 325 MG tablet Take 325 mg by mouth daily.        . bisoprolol-hydrochlorothiazide (ZIAC) 2.5-6.25 MG per tablet Take 1 tablet by mouth daily.        [name] levothyroxine (SYNTHROID, LEVOTHROID) 200 MCG tablet Take 250 mcg by mouth daily.        [name] tiotropium (SPIRIVA) 18 MCG inhalation capsule Place 18 mcg into inhaler and inhale daily.        [name] VIAGRA 100 MG tablet as directed.        No Known Allergies  Past Medical History  Diagnosis Date  . Streptococcal pneumonia August 2009  . Congestive heart failure   . Gastrointestinal obstruction     Ileus  . Pulmonary hypertension   . Polycythemia   . Hypothyroid   . COPD (chronic obstructive pulmonary disease)   . Obesity   . Lung cancer     Past Surgical History  Procedure Date  . Appendectomy   . Back surgery     History  Smoking status  . Former Smoker -- 3.0 packs/day  Smokeless tobacco  . Not on file    History  Alcohol Use: Not on file     Family History  Problem Relation Age of Onset  . Heart disease Father     Reviw of Systems:  Reviewed in the HPI.  All other systems are negative.  Physical Exam: Blood pressure 135/86, pulse 82, height 6' (1.829 m), weight 237 lb (107.502 kg). General: Well developed, well nourished, in no acute distress.  Head: Normocephalic, atraumatic, sclera non-icteric, mucus membranes are moist,   Neck: Supple. Negative for carotid bruits. JVD not elevated.  Lungs: Clear bilaterally to auscultation without wheezes, rales, or rhonchi. Breathing is unlabored.  Heart: Irregularly irregular with S1 S2. No murmurs, rubs, or gallops appreciated.  Abdomen: Soft, non-tender, non-distended with normoactive bowel sounds. No hepatomegaly. No rebound/guarding. No obvious abdominal masses.  Msk:  Strength and tone appear normal for age.  Extremities: No clubbing or cyanosis. No edema.  Distal pedal pulses are 2+ and equal bilaterally.  Neuro: Alert and oriented X 3. Moves all extremities spontaneously.  Psych:  Responds to questions appropriately with a normal affect.  ECG: Atrial fibrillation. He has nonspecific ST and T-wave changes.  Assessment / Plan:

## 2011-10-30 ENCOUNTER — Ambulatory Visit (HOSPITAL_COMMUNITY): Payer: BC Managed Care – PPO | Attending: Cardiology | Admitting: Radiology

## 2011-10-30 DIAGNOSIS — J449 Chronic obstructive pulmonary disease, unspecified: Secondary | ICD-10-CM | POA: Insufficient documentation

## 2011-10-30 DIAGNOSIS — J4489 Other specified chronic obstructive pulmonary disease: Secondary | ICD-10-CM | POA: Insufficient documentation

## 2011-10-30 DIAGNOSIS — I4891 Unspecified atrial fibrillation: Secondary | ICD-10-CM | POA: Insufficient documentation

## 2011-10-30 DIAGNOSIS — E669 Obesity, unspecified: Secondary | ICD-10-CM | POA: Insufficient documentation

## 2011-10-30 DIAGNOSIS — Z87891 Personal history of nicotine dependence: Secondary | ICD-10-CM | POA: Insufficient documentation

## 2011-10-30 DIAGNOSIS — Z85118 Personal history of other malignant neoplasm of bronchus and lung: Secondary | ICD-10-CM | POA: Insufficient documentation

## 2011-10-30 DIAGNOSIS — I509 Heart failure, unspecified: Secondary | ICD-10-CM | POA: Insufficient documentation

## 2011-11-02 NOTE — Telephone Encounter (Signed)
ATCx1, NA, no voicemai. WCB. Carron Curie, CMA

## 2011-11-05 NOTE — Telephone Encounter (Signed)
I spoke with the pt and he states he is feeling much better and is back at work. Carron Curie, CMA

## 2011-11-23 ENCOUNTER — Ambulatory Visit (INDEPENDENT_AMBULATORY_CARE_PROVIDER_SITE_OTHER): Payer: BC Managed Care – PPO | Admitting: Cardiovascular Disease

## 2011-11-23 ENCOUNTER — Encounter: Payer: Self-pay | Admitting: Cardiovascular Disease

## 2011-11-23 ENCOUNTER — Other Ambulatory Visit: Payer: BC Managed Care – PPO

## 2011-11-23 ENCOUNTER — Encounter: Payer: Self-pay | Admitting: *Deleted

## 2011-11-23 VITALS — BP 115/79 | HR 77 | Ht 72.0 in | Wt 235.8 lb

## 2011-11-23 DIAGNOSIS — I4891 Unspecified atrial fibrillation: Secondary | ICD-10-CM

## 2011-11-23 LAB — BASIC METABOLIC PANEL
BUN: 14 mg/dL (ref 6–23)
Creatinine, Ser: 1.3 mg/dL (ref 0.4–1.5)
GFR: 61.8 mL/min (ref >60.00)
Potassium: 4.5 mEq/L (ref 3.5–5.1)

## 2011-11-23 LAB — CBC WITH DIFFERENTIAL/PLATELET
Eosinophils Relative: 2.4 % (ref 0.0–5.0)
Lymphocytes Relative: 19.2 % (ref 12.0–46.0)
Monocytes Relative: 6.5 % (ref 3.0–12.0)
Neutrophils Relative %: 71.4 % (ref 43.0–77.0)
Platelets: 193 10*3/uL (ref 150.0–400.0)
WBC: 11.2 10*3/uL — ABNORMAL HIGH (ref 4.5–10.5)

## 2011-11-23 LAB — TSH: TSH: 2.15 u[IU]/mL (ref 0.35–5.50)

## 2011-11-23 NOTE — Assessment & Plan Note (Signed)
Time he remains in atrial fibrillation. He's feeling quite a bit better since we reduce his Synthroid dose. We have a followup TSH to be drawn later today.  We discussed cardioversion. He's been on Pradaxa for one month.  We'll schedule him for a cardioversion either this week or next week. He were prefer to do it on Friday so that he can recover of the weekend. He drives trucks for a living.  We'll check a basic metabolic profile and CBC. We do not need to check a protime because he's on Pradaxa.  I'll see him again in one month for an office visit and EKG.

## 2011-11-23 NOTE — Patient Instructions (Signed)
Your physician has recommended that you have a Cardioversion (DCCV). Electrical Cardioversion uses a jolt of electricity to your heart either through paddles or wired patches attached to your chest. This is a controlled, usually prescheduled, procedure. Defibrillation is done under light anesthesia in the hospital, and you usually go home the day of the procedure. This is done to get your heart back into a normal rhythm. You are not awake for the procedure. Please see the instruction sheet given to you today.   Your physician recommends that you schedule a follow-up appointment in: 1 month with ekg

## 2011-11-23 NOTE — Progress Notes (Signed)
  [name] Date of Birth  12/27/1950 [facility]     [place] [facility]  [address]    [address]   [address] [place], [place]  [address]    [place], [place]  [address] [phone]  Fax  [phone]  [phone]  Fax [phone]   Problem List: 1. Atrial Fibrillation 2. Lung Cancer - s/p LUL lobectomy, no chemo or XRT 3. Gout 4. COPD 5.  Hypothyroidism 6.    History of Present Illness:  [name] is a 61 yo with a recent Dx of A-fib.  He is fairly symptomatic. He is able to do all his normal activities without any significant problems. He occasionally has some rapid heart rate and palpitations but mostly he is asymptomatic.  He works as a truck driver driving local routes.  He has been on antibiotics for a URI recently. He avoids salt.  He does not get regular exercise.    He was seen one month ago. He was found to have atrial fibrillation. He was also found to be over placed with his Synthroid. His TSH was 0.012 which was a year ago.  His Synthroid dose was cut in half. He returns today for followup visit.  He feels quite well. He thinks that his heart feels quite a bit better. He feels a lot better after reducing the dose of his Synthroid.  Current Outpatient Prescriptions on File Prior to Visit  Medication Sig Dispense Refill  . amLODipine (NORVASC) 5 MG tablet Take 1 tablet by mouth Daily.      . aspirin 325 MG tablet Take 325 mg by mouth daily.        . Bioflavonoid Products (GRAPE SEED PO) Take by mouth 2 (two) times daily.      . bisoprolol-hydrochlorothiazide (ZIAC) 2.5-6.25 MG per tablet Take 1 tablet by mouth daily.        . dabigatran (PRADAXA) 150 MG CAPS Take 1 capsule (150 mg total) by mouth every 12 (twelve) hours.  60 capsule  5  . levothyroxine (SYNTHROID, LEVOTHROID) 125 MCG tablet Take 1 tablet (125 mcg total) by mouth daily.  30 tablet  5  . tiotropium (SPIRIVA) 18 MCG inhalation capsule Place 18 mcg into inhaler and inhale daily.         . VIAGRA 100 MG tablet as directed.        No Known Allergies  Past Medical History  Diagnosis Date  . Streptococcal pneumonia August 2009  . Congestive heart failure   . Gastrointestinal obstruction     Ileus  . Pulmonary hypertension   . Polycythemia   . Hypothyroid   . COPD (chronic obstructive pulmonary disease)   . Obesity   . Lung cancer     Past Surgical History  Procedure Date  . Appendectomy   . Back surgery     History  Smoking status  . Former Smoker -- 3.0 packs/day  Smokeless tobacco  . Not on file    History  Alcohol Use: Not on file    Family History  Problem Relation Age of Onset  . Heart disease Father     Reviw of Systems:  Reviewed in the HPI.  All other systems are negative.  Physical Exam: Blood pressure 115/79, pulse 77, height 6' (1.829 m), weight 235 lb 12.8 oz (106.958 kg). General: Well developed, well nourished, in no acute distress.  Head: Normocephalic, atraumatic, sclera non-icteric, mucus membranes are moist,   Neck: Supple. Negative for carotid   bruits. JVD not elevated.  Lungs: Clear bilaterally to auscultation without wheezes, rales, or rhonchi. Breathing is unlabored.  Heart: Irregularly irregular with S1 S2. No murmurs, rubs, or gallops appreciated.  Abdomen: Soft, non-tender, non-distended with normoactive bowel sounds. No hepatomegaly. No rebound/guarding. No obvious abdominal masses.  Msk:  Strength and tone appear normal for age.  Extremities: No clubbing or cyanosis. No edema.  Distal pedal pulses are 2+ and equal bilaterally.  Neuro: Alert and oriented X 3. Moves all extremities spontaneously.  Psych:  Responds to questions appropriately with a normal affect.  ECG:  Assessment / Plan:   

## 2011-11-27 ENCOUNTER — Other Ambulatory Visit: Payer: Self-pay

## 2011-11-27 ENCOUNTER — Encounter (HOSPITAL_COMMUNITY): Payer: Self-pay

## 2011-11-27 ENCOUNTER — Ambulatory Visit (HOSPITAL_COMMUNITY): Payer: BC Managed Care – PPO | Admitting: Certified Registered"

## 2011-11-27 ENCOUNTER — Encounter (HOSPITAL_COMMUNITY)
Admission: RE | Disposition: A | Discharge status: Home / Self Care | Payer: Self-pay | Source: Ambulatory Visit | Attending: Cardiovascular Disease

## 2011-11-27 ENCOUNTER — Encounter (HOSPITAL_COMMUNITY): Payer: Self-pay | Admitting: Certified Registered"

## 2011-11-27 ENCOUNTER — Ambulatory Visit (HOSPITAL_COMMUNITY)
Admission: RE | Admit: 2011-11-27 | Discharge: 2011-11-27 | Disposition: A | Discharge status: Home / Self Care | Payer: BC Managed Care – PPO | Source: Ambulatory Visit | Attending: Cardiovascular Disease | Admitting: Cardiovascular Disease

## 2011-11-27 DIAGNOSIS — C3490 Malignant neoplasm of unspecified part of unspecified bronchus or lung: Secondary | ICD-10-CM | POA: Insufficient documentation

## 2011-11-27 DIAGNOSIS — Z79899 Other long term (current) drug therapy: Secondary | ICD-10-CM | POA: Insufficient documentation

## 2011-11-27 DIAGNOSIS — I509 Heart failure, unspecified: Secondary | ICD-10-CM | POA: Insufficient documentation

## 2011-11-27 DIAGNOSIS — I4891 Unspecified atrial fibrillation: Secondary | ICD-10-CM | POA: Insufficient documentation

## 2011-11-27 DIAGNOSIS — M109 Gout, unspecified: Secondary | ICD-10-CM | POA: Insufficient documentation

## 2011-11-27 DIAGNOSIS — J449 Chronic obstructive pulmonary disease, unspecified: Secondary | ICD-10-CM | POA: Insufficient documentation

## 2011-11-27 DIAGNOSIS — Z7901 Long term (current) use of anticoagulants: Secondary | ICD-10-CM | POA: Insufficient documentation

## 2011-11-27 DIAGNOSIS — E039 Hypothyroidism, unspecified: Secondary | ICD-10-CM | POA: Insufficient documentation

## 2011-11-27 DIAGNOSIS — Z7982 Long term (current) use of aspirin: Secondary | ICD-10-CM | POA: Insufficient documentation

## 2011-11-27 DIAGNOSIS — E669 Obesity, unspecified: Secondary | ICD-10-CM | POA: Insufficient documentation

## 2011-11-27 DIAGNOSIS — D45 Polycythemia vera: Secondary | ICD-10-CM | POA: Insufficient documentation

## 2011-11-27 DIAGNOSIS — J4489 Other specified chronic obstructive pulmonary disease: Secondary | ICD-10-CM | POA: Insufficient documentation

## 2011-11-27 HISTORY — PX: CARDIOVERSION: SHX1299

## 2011-11-27 SURGERY — CARDIOVERSION
Anesthesia: Monitor Anesthesia Care | Wound class: Clean

## 2011-11-27 MED ORDER — SODIUM CHLORIDE 0.9 % IJ SOLN: 3.0000 mL | Freq: Two times a day (BID) | INTRAMUSCULAR | Status: DC

## 2011-11-27 MED ORDER — HYDROCORTISONE 1 % EX CREA: 1.0000 "application " | TOPICAL_CREAM | Freq: Three times a day (TID) | CUTANEOUS | Status: DC | PRN

## 2011-11-27 MED ORDER — LIDOCAINE HCL (CARDIAC) 20 MG/ML IV SOLN
INTRAVENOUS | Status: DC | PRN
  Administered 2011-11-27: 20 mg via INTRAVENOUS

## 2011-11-27 MED ORDER — SODIUM CHLORIDE 0.9 % IV SOLN
250.0000 mL | INTRAVENOUS | Status: DC
  Administered 2011-11-27: 500 mL via INTRAVENOUS

## 2011-11-27 MED ORDER — PROPOFOL 10 MG/ML IV BOLUS
INTRAVENOUS | Status: DC | PRN
  Administered 2011-11-27: 45 ug via INTRAVENOUS

## 2011-11-27 MED ORDER — SODIUM CHLORIDE 0.9 % IJ SOLN: 3.0000 mL | INTRAMUSCULAR | Status: DC | PRN

## 2011-11-27 NOTE — Transfer of Care (Signed)
Immediate Anesthesia Transfer of Care Note  Patient: [name]  Procedure(s) Performed: Procedure(s) (LRB): CARDIOVERSION (N/A)  Patient Location: PACU and Endoscopy Unit  Anesthesia Type: General  Level of Consciousness: awake  Airway & Oxygen Therapy: Patient Spontanous Breathing  Post-op Assessment: Post -op Vital signs reviewed and stable  Post vital signs: Reviewed and stable  Complications: No apparent anesthesia complications

## 2011-11-27 NOTE — H&P (View-Only) (Signed)
[name] Date of Birth  12-17-1950 [facility]     [place]  [address]    [address]   [address] [place], [place]  [address]    [place], [place]  [address] [phone]  Fax  [phone]  [phone]  Fax [phone]   Problem List: 1. Atrial Fibrillation 2. Lung Cancer - s/p LUL lobectomy, no chemo or XRT 3. Gout 4. COPD 5.  Hypothyroidism 6.    History of Present Illness:  [name] is a 61 yo with a recent Dx of A-fib.  He is fairly symptomatic. He is able to do all his normal activities without any significant problems. He occasionally has some rapid heart rate and palpitations but mostly he is asymptomatic.  He works as a Therapist, occupational routes.  He has been on antibiotics for a URI recently. He avoids salt.  He does not get regular exercise.    He was seen one month ago. He was found to have atrial fibrillation. He was also found to be over placed with his Synthroid. His TSH was 0.012 which was a year ago.  His Synthroid dose was cut in half. He returns today for followup visit.  He feels quite well. He thinks that his heart feels quite a bit better. He feels a lot better after reducing the dose of his Synthroid.  Current Outpatient Prescriptions on File Prior to Visit  Medication Sig Dispense Refill  . amLODipine (NORVASC) 5 MG tablet Take 1 tablet by mouth Daily.      [name] aspirin 325 MG tablet Take 325 mg by mouth daily.        [name] Bioflavonoid Products (GRAPE SEED PO) Take by mouth 2 (two) times daily.      . bisoprolol-hydrochlorothiazide (ZIAC) 2.5-6.25 MG per tablet Take 1 tablet by mouth daily.        . dabigatran (PRADAXA) 150 MG CAPS Take 1 capsule (150 mg total) by mouth every 12 (twelve) hours.  60 capsule  5  . levothyroxine (SYNTHROID, LEVOTHROID) 125 MCG tablet Take 1 tablet (125 mcg total) by mouth daily.  30 tablet  5  . tiotropium (SPIRIVA) 18 MCG inhalation capsule Place 18 mcg into inhaler and inhale daily.         [name] VIAGRA 100 MG tablet as directed.        No Known Allergies  Past Medical History  Diagnosis Date  . Streptococcal pneumonia August 2009  . Congestive heart failure   . Gastrointestinal obstruction     Ileus  . Pulmonary hypertension   . Polycythemia   . Hypothyroid   . COPD (chronic obstructive pulmonary disease)   . Obesity   . Lung cancer     Past Surgical History  Procedure Date  . Appendectomy   . Back surgery     History  Smoking status  . Former Smoker -- 3.0 packs/day  Smokeless tobacco  . Not on file    History  Alcohol Use: Not on file    Family History  Problem Relation Age of Onset  . Heart disease Father     Reviw of Systems:  Reviewed in the HPI.  All other systems are negative.  Physical Exam: Blood pressure 115/79, pulse 77, height 6' (1.829 m), weight 235 lb 12.8 oz (106.958 kg). General: Well developed, well nourished, in no acute distress.  Head: Normocephalic, atraumatic, sclera non-icteric, mucus membranes are moist,   Neck: Supple. Negative for carotid  bruits. JVD not elevated.  Lungs: Clear bilaterally to auscultation without wheezes, rales, or rhonchi. Breathing is unlabored.  Heart: Irregularly irregular with S1 S2. No murmurs, rubs, or gallops appreciated.  Abdomen: Soft, non-tender, non-distended with normoactive bowel sounds. No hepatomegaly. No rebound/guarding. No obvious abdominal masses.  Msk:  Strength and tone appear normal for age.  Extremities: No clubbing or cyanosis. No edema.  Distal pedal pulses are 2+ and equal bilaterally.  Neuro: Alert and oriented X 3. Moves all extremities spontaneously.  Psych:  Responds to questions appropriately with a normal affect.  ECG:  Assessment / Plan:

## 2011-11-27 NOTE — Op Note (Addendum)
    Cardioversion Note  [name] [id number] 1951-08-04  Procedure: DC Cardioversion Indications: Atrial Fibrillation  Procedure Details Consent: Obtained Time Out: Verified patient identification, verified procedure, site/side was marked, verified correct patient position, special equipment/implants available, Radiology Safety Procedures followed,  medications/allergies/relevent history reviewed, required imaging and test results available.  Performed  The patient has been on adequate anticoagulation.  Lidocaine 20 mg IV was given prior to propofol. The patient received IV Propofol 45 mg for sedation.  Synchronous cardioversion was performed at 150 joules.  The cardioversion was successful.    Complications: No apparent complications Patient did tolerate procedure well.   Vesta Mixer, Montez Hageman., MD, [facility] 11/27/2011, 11:12 AM

## 2011-11-27 NOTE — Interval H&P Note (Signed)
History and Physical Interval Note:  11/27/2011 11:04 AM  [name]  has presented today for surgery, with the diagnosis of AFIB  The various methods of treatment have been discussed with the patient and family. After consideration of risks, benefits and other options for treatment, the patient has consented to  Procedure(s) (LRB): CARDIOVERSION (N/A) as a surgical intervention .  The patients' history has been reviewed, patient examined, no change in status, stable for surgery.  I have reviewed the patients' chart and labs.  Questions were answered to the patient's satisfaction.     Elyn Aquas.

## 2011-11-27 NOTE — Discharge Instructions (Addendum)
Follow up with Dr. Harvie Bridge office with Norma Fredrickson, NP  [phone]) in several weeks.Transesophageal Echocardiography A transesophageal echocardiogram (TEE) is a special type of test that produces images of the heart by sound waves (echocardiogram). This type of echocardiogram can obtain better images of the heart than a standard echocardiogram. A TEE is done by passing a flexible tube down the esophagus. The heart is located in front of the esophagus. Because the heart and esophagus are close to one another, your caregiver can take very clear, detailed pictures of the heart via ultrasound waves. WHY HAVE A TEE? Your caregiver may need more information based on your medical condition. A TEE is usually performed due to the following:  Your caregiver needs more information based on standard echocardiogram findings.   If you had a stroke, this might have happened because a clot formed in your heart. A TEE can visualize different areas of the heart and check for clots.   To check valve anatomy and function. Your caregiver will especially look at the mitral valve.   To check for redness, soreness, and swelling (inflammation) on the inside lining of the heart (endocarditis).   To evaluate the dividing wall (septum) of the heart and presence of a hole that did not close after birth (patent foramen ovale, PFO).   To help diagnose a tear in the wall of the aorta (aortic dissection).   During cardiac valve surgery, a TEE probe is placed. This allows the surgeon to assess the valve repair before closing the chest.  LET YOUR CAREGIVER KNOW ABOUT:   Swallowing difficulties.   An esophageal obstruction.   Use of aspirin or antiplatelet therapy.  RISKS AND COMPLICATIONS  Though extremely rare, an esophageal tear (rupture) is a potential complication. BEFORE THE PROCEDURE   Arrive at least 1 hour before the procedure or as told by your caregiver.   Do not eat or drink for 6 hours before the  procedure or as told by your caregiver.   An intravenous (IV) access tube will be started in the arm.  PROCEDURE   A medicine to help you relax (sedative) will be given through the IV.   A medicine that numbs the area (local anesthetic) may be sprayed to the back of the throat.   Your blood pressure, heart rate, and breathing (vital signs) will be monitored during the procedure.   The TEE probe is a long, flexible tube. It is about the width of an adult male's index finger. The tip of the probe is placed into the back of the mouth and you will be asked to swallow. This helps to pass the tip of the probe into the esophagus. Once the tip of the probe is in the correct area, your caregiver can take pictures of the heart.   A TEE is usually not a painful procedure. You may feel the probe press against the back of the throat. The probe does not enter the trachea and does not affect your breathing.   Your time spent at the hospital is usually less than 2 hours.  AFTER THE PROCEDURE   You will be in bed, resting until you have fully returned to consciousness.   When you first awaken, your throat may feel slightly sore and will probably still feel numb. This will improve slowly over time.   You will not be allowed to eat or drink until it is clear that numbness has improved.   Once you have been able to drink, urinate,  and sit on the edge of the bed without feeling sick to your stomach (nauseous) or dizzy, you may be cleared to dress and go home.   Do not drive yourself home. You have had medications that can continue to make you feel drowsy and can impair your reflexes.   You should have a friend or family member with you for the next 24 hours after your examination.  Obtaining the test results It is your responsibility to obtain your test results. Ask the lab or department performing the test when and how you will get your results. SEEK IMMEDIATE MEDICAL CARE IF:   There is chest pain.    You have a hard time breathing or have shortness of breath.   You cough or throw up (vomit) blood.  MAKE SURE YOU:   Understand these instructions.   Will watch this condition.   Will get help right away if you is not doing well or gets worse.  Document Released: 11/28/2002 Document Revised: 08/27/2011 Document Reviewed: 02/19/2009 Exit[employer] Patient Information 2012 Exit[employer], LLCElectrical Cardioversion Cardioversion is the delivery of a jolt of electricity to change the rhythm of the heart. Sticky patches or metal paddles are placed on the chest to deliver the electricity from a special device. This is done to restore a normal rhythm. A rhythm that is too fast or not regular keeps the heart from pumping well. Compared to medicines used to change an abnormal rhythm, cardioversion is faster and works better. It is also unpleasant and may dislodge blood clots from the heart. WHEN WOULD THIS BE DONE?  In an emergency:   There is low or no blood pressure as a result of the heart rhythm.   Normal rhythm must be restored as fast as possible to protect the brain and heart from further damage.   It may save a life.   For less serious heart rhythms, such as atrial fibrillation or flutter, in which:   The heart is beating too fast or is not regular.   The heart is still able to pump enough blood, but not as well as it should.   Medicine to change the rhythm has not worked.   It is safe to wait in order to allow time for preparation.  LET YOUR CAREGIVER KNOW ABOUT:   Every medicine you are taking. It is very important to do this! Know when to take or stop taking any of them.   Any time in the past that you have felt your heart was not beating normally.  RISKS AND COMPLICATIONS   Clots may form in the chambers of the heart if it is beating too fast. These clots may be dislodged during the procedure and travel to other parts of the body.   There is risk of a stroke during and after  the procedure if a clot moves. Blood thinners lower this risk.   You may have a special test of your heart (TEE) to make sure there are no clots in your heart.  BEFORE THE PROCEDURE   You may have some tests to see how well your heart is working.   You may start taking blood thinners so your blood does not clot as easily.   Other drugs may be given to help your heart work better.  PROCEDURE (SCHEDULED)  The procedure is typically done in a hospital by a heart doctor (cardiologist).   You will be told when and where to go.   You may be given some medicine  through an intravenous (IV) access to reduce discomfort and make you sleepy before the procedure.   Your whole body may move when the shock is delivered. Your chest may feel sore.   You may be able to go home after a few hours. Your heart rhythm will be watched to make sure it does not change.  HOME CARE INSTRUCTIONS   Only take medicine as directed by your caregiver. Be sure you understand how and when to take your medicine.   Learn how to feel your pulse and check it often.   Limit your activity for 48 hours.   Avoid caffeine and other stimulants as directed.  SEEK MEDICAL CARE IF:   You feel like your heart is beating too fast or your pulse is not regular.   You have any questions about your medicines.   You have bleeding that will not stop.  SEEK IMMEDIATE MEDICAL CARE IF:   You are dizzy or feel faint.   It is hard to breathe or you feel short of breath.   There is a change in discomfort in your chest.   Your speech is slurred or you have trouble moving your arm or leg on one side.   You get a muscle cramp.   Your fingers or toes turn cold or blue.  MAKE SURE YOU:   Understand these instructions.   Will watch your condition.   Will get help right away if you are not doing well or get worse.  Document Released: 08/28/2002 Document Revised: 08/27/2011 Document Reviewed: 12/28/2007 [facility] Patient  Information 2012 [place], [place].[name]

## 2011-11-27 NOTE — Anesthesia Postprocedure Evaluation (Signed)
  Anesthesia Post-op Note  Patient: [name]  Procedure(s) Performed: Procedure(s) (LRB): CARDIOVERSION (N/A)  Patient Location: PACU and Endoscopy Unit  Anesthesia Type: General  Level of Consciousness: awake  Airway and Oxygen Therapy: Patient Spontanous Breathing  Post-op Pain: none  Post-op Assessment: Patient's Cardiovascular Status Stable  Post-op Vital Signs: Reviewed  Complications: No apparent anesthesia complications

## 2011-11-27 NOTE — Anesthesia Preprocedure Evaluation (Addendum)
Anesthesia Evaluation  Patient identified by MRN, date of birth, ID band Patient awake    Reviewed: Allergy & Precautions, NPO status , Patient's Chart, lab work & pertinent test results  Airway Mallampati: II      Dental  (+) Teeth Intact   Pulmonary COPD COPD inhaler,  HX Lung CA  Hx Lung  CA        Cardiovascular hypertension, On Medications + dysrhythmias Atrial Fibrillation Rhythm:irregular     Neuro/Psych    GI/Hepatic   Endo/Other    Renal/GU      Musculoskeletal   Abdominal   Peds  Hematology   Anesthesia Other Findings   Reproductive/Obstetrics                           Anesthesia Physical Anesthesia Plan  ASA: III  Anesthesia Plan: General   Post-op Pain Management:    Induction:   Airway Management Planned:   Additional Equipment:   Intra-op Plan:   Post-operative Plan:   Informed Consent: I have reviewed the patients History and Physical, chart, labs and discussed the procedure including the risks, benefits and alternatives for the proposed anesthesia with the patient or authorized representative who has indicated his/her understanding and acceptance.     Plan Discussed with: CRNA  Anesthesia Plan Comments:         Anesthesia Quick Evaluation

## 2011-11-30 ENCOUNTER — Encounter (HOSPITAL_COMMUNITY): Payer: Self-pay | Admitting: Cardiovascular Disease

## 2011-12-03 ENCOUNTER — Encounter: Payer: Self-pay | Admitting: Internal Medicine

## 2011-12-03 ENCOUNTER — Other Ambulatory Visit (INDEPENDENT_AMBULATORY_CARE_PROVIDER_SITE_OTHER): Payer: BC Managed Care – PPO

## 2011-12-03 ENCOUNTER — Ambulatory Visit (INDEPENDENT_AMBULATORY_CARE_PROVIDER_SITE_OTHER)
Admission: RE | Admit: 2011-12-03 | Discharge: 2011-12-03 | Disposition: A | Discharge status: Home / Self Care | Payer: BC Managed Care – PPO | Source: Ambulatory Visit | Attending: Internal Medicine | Admitting: Internal Medicine

## 2011-12-03 ENCOUNTER — Ambulatory Visit (INDEPENDENT_AMBULATORY_CARE_PROVIDER_SITE_OTHER): Payer: BC Managed Care – PPO | Admitting: Internal Medicine

## 2011-12-03 ENCOUNTER — Other Ambulatory Visit: Payer: BC Managed Care – PPO

## 2011-12-03 ENCOUNTER — Telehealth: Payer: Self-pay | Admitting: Internal Medicine

## 2011-12-03 VITALS — BP 118/86 | HR 64 | Temp 99.1°F | Resp 16 | Ht 71.0 in | Wt 242.5 lb

## 2011-12-03 DIAGNOSIS — J180 Bronchopneumonia, unspecified organism: Secondary | ICD-10-CM

## 2011-12-03 DIAGNOSIS — Z23 Encounter for immunization: Secondary | ICD-10-CM

## 2011-12-03 DIAGNOSIS — F3289 Other specified depressive episodes: Secondary | ICD-10-CM

## 2011-12-03 DIAGNOSIS — E039 Hypothyroidism, unspecified: Secondary | ICD-10-CM | POA: Insufficient documentation

## 2011-12-03 DIAGNOSIS — Z0000 Encounter for general adult medical examination without abnormal findings: Secondary | ICD-10-CM | POA: Insufficient documentation

## 2011-12-03 DIAGNOSIS — I4891 Unspecified atrial fibrillation: Secondary | ICD-10-CM

## 2011-12-03 DIAGNOSIS — I10 Essential (primary) hypertension: Secondary | ICD-10-CM

## 2011-12-03 DIAGNOSIS — F32A Depression, unspecified: Secondary | ICD-10-CM | POA: Insufficient documentation

## 2011-12-03 DIAGNOSIS — E7800 Pure hypercholesterolemia, unspecified: Secondary | ICD-10-CM

## 2011-12-03 DIAGNOSIS — F528 Other sexual dysfunction not due to a substance or known physiological condition: Secondary | ICD-10-CM

## 2011-12-03 DIAGNOSIS — F329 Major depressive disorder, single episode, unspecified: Secondary | ICD-10-CM

## 2011-12-03 HISTORY — DX: Encounter for general adult medical examination without abnormal findings: Z00.00

## 2011-12-03 HISTORY — DX: Pure hypercholesterolemia, unspecified: E78.00

## 2011-12-03 LAB — URINALYSIS, ROUTINE W REFLEX MICROSCOPIC
Leukocytes, UA: NEGATIVE
Nitrite: NEGATIVE
Specific Gravity, Urine: 1.02 (ref 1.000–1.030)
Total Protein, Urine: NEGATIVE
pH: 6.5 (ref 5.0–8.0)

## 2011-12-03 LAB — FECAL OCCULT BLOOD, GUAIAC: Fecal Occult Blood: NEGATIVE

## 2011-12-03 LAB — LDL CHOLESTEROL, DIRECT: Direct LDL: 205.4 mg/dL

## 2011-12-03 MED ORDER — FLUOXETINE HCL 20 MG PO TABS: 20.0000 mg | ORAL_TABLET | Freq: Every day | ORAL | Status: DC

## 2011-12-03 MED ORDER — SILDENAFIL CITRATE 100 MG PO TABS: 50.0000 mg | ORAL_TABLET | ORAL | Status: DC

## 2011-12-03 MED ORDER — SILDENAFIL CITRATE 100 MG PO TABS: 100.0000 mg | ORAL_TABLET | ORAL | Status: DC

## 2011-12-03 MED ORDER — PITAVASTATIN CALCIUM 4 MG PO TABS
1.0000 | ORAL_TABLET | Freq: Every day | ORAL | Status: DC
Start: 2011-12-03 — End: 2011-12-09

## 2011-12-03 NOTE — Assessment & Plan Note (Signed)
Changed to prozac at his request

## 2011-12-03 NOTE — Assessment & Plan Note (Signed)
Exam done, labs ordered, he was referred for a colonoscopy, vaccines were updated, pt ed material was given

## 2011-12-03 NOTE — Progress Notes (Signed)
Addended by: Etta Grandchild on: 12/03/2011 04:15 PM   Modules accepted: Orders

## 2011-12-03 NOTE — Assessment & Plan Note (Signed)
LDL > 200 so I have asked him to start taking livalo

## 2011-12-03 NOTE — Patient Instructions (Signed)
Health Maintenance, Males A healthy lifestyle and preventative care can promote health and wellness.  Maintain regular health, dental, and eye exams.   Eat a healthy diet. Foods like vegetables, fruits, whole grains, low-fat dairy products, and lean protein foods contain the nutrients you need without too many calories. Decrease your intake of foods high in solid fats, added sugars, and salt. Get information about a proper diet from your caregiver, if necessary.   Regular physical exercise is one of the most important things you can do for your health. Most adults should get at least 150 minutes of moderate-intensity exercise (any activity that increases your heart rate and causes you to sweat) each week. In addition, most adults need muscle-strengthening exercises on 2 or more days a week.    Maintain a healthy weight. The body mass index (BMI) is a screening tool to identify possible weight problems. It provides an estimate of body fat based on height and weight. Your caregiver can help determine your BMI, and can help you achieve or maintain a healthy weight. For adults 20 years and older:   A BMI below 18.5 is considered underweight.   A BMI of 18.5 to 24.9 is normal.   A BMI of 25 to 29.9 is considered overweight.   A BMI of 30 and above is considered obese.   Maintain normal blood lipids and cholesterol by exercising and minimizing your intake of saturated fat. Eat a balanced diet with plenty of fruits and vegetables. Blood tests for lipids and cholesterol should begin at age 20 and be repeated every 5 years. If your lipid or cholesterol levels are high, you are over 50, or you are a high risk for heart disease, you may need your cholesterol levels checked more frequently.Ongoing high lipid and cholesterol levels should be treated with medicines, if diet and exercise are not effective.   If you smoke, find out from your caregiver how to quit. If you do not use tobacco, do not start.    If you choose to drink alcohol, do not exceed 2 drinks per day. One drink is considered to be 12 ounces (355 mL) of beer, 5 ounces (148 mL) of wine, or 1.5 ounces (44 mL) of liquor.   Avoid use of street drugs. Do not share needles with anyone. Ask for help if you need support or instructions about stopping the use of drugs.   High blood pressure causes heart disease and increases the risk of stroke. Blood pressure should be checked at least every 1 to 2 years. Ongoing high blood pressure should be treated with medicines if weight loss and exercise are not effective.   If you are 45 to 62 years old, ask your caregiver if you should take aspirin to prevent heart disease.   Diabetes screening involves taking a blood sample to check your fasting blood sugar level. This should be done once every 3 years, after age 45, if you are within normal weight and without risk factors for diabetes. Testing should be considered at a younger age or be carried out more frequently if you are overweight and have at least 1 risk factor for diabetes.   Colorectal cancer can be detected and often prevented. Most routine colorectal cancer screening begins at the age of 50 and continues through age 75. However, your caregiver may recommend screening at an earlier age if you have risk factors for colon cancer. On a yearly basis, your caregiver may provide home test kits to check for hidden   blood in the stool. Use of a small camera at the end of a tube, to directly examine the colon (sigmoidoscopy or colonoscopy), can detect the earliest forms of colorectal cancer. Talk to your caregiver about this at age 50, when routine screening begins. Direct examination of the colon should be repeated every 5 to 10 years through age 75, unless early forms of pre-cancerous polyps or small growths are found.   Hepatitis C blood testing is recommended for all people born from 1945 through 1965 and any individual with known risks for  hepatitis C.   Healthy men should no longer receive prostate-specific antigen (PSA) blood tests as part of routine cancer screening. Consult with your caregiver about prostate cancer screening.   Testicular cancer screening is not recommended for adolescents or adult males who have no symptoms. Screening includes self-exam, caregiver exam, and other screening tests. Consult with your caregiver about any symptoms you have or any concerns you have about testicular cancer.   Practice safe sex. Use condoms and avoid high-risk sexual practices to reduce the spread of sexually transmitted infections (STIs).   Use sunscreen with a sun protection factor (SPF) of 30 or greater. Apply sunscreen liberally and repeatedly throughout the day. You should seek shade when your shadow is shorter than you. Protect yourself by wearing long sleeves, pants, a wide-brimmed hat, and sunglasses year round, whenever you are outdoors.   Notify your caregiver of new moles or changes in moles, especially if there is a change in shape or color. Also notify your caregiver if a mole is larger than the size of a pencil eraser.   A one-time screening for abdominal aortic aneurysm (AAA) and surgical repair of large AAAs by sound wave imaging (ultrasonography) is recommended for ages 65 to 75 years who are current or former smokers.   Stay current with your immunizations.  Document Released: 03/05/2008 Document Revised: 08/27/2011 Document Reviewed: 02/02/2011 [employer] Patient Information 2012 [employer]. 

## 2011-12-03 NOTE — Telephone Encounter (Signed)
Received copies from Synthia Innocent MD ,on 12/02/11 . Forwarded  30 pages pages to Dr Felicity Coyer. ,for review.

## 2011-12-03 NOTE — Assessment & Plan Note (Signed)
Continue viagra as needed 

## 2011-12-03 NOTE — Assessment & Plan Note (Signed)
He has no s/s, I will repeat his CXR today

## 2011-12-03 NOTE — Assessment & Plan Note (Signed)
Recent TSH was normal 

## 2011-12-03 NOTE — Assessment & Plan Note (Signed)
He has good rate and rhythm control today. 

## 2011-12-03 NOTE — Assessment & Plan Note (Signed)
His BP is well controlled 

## 2011-12-03 NOTE — Progress Notes (Signed)
Subjective:    Patient ID: [name], male    DOB: July 13, 1951, 61 y.o.   MRN: [id number]  HPI New to me for a complete physical, he was treated for PNA about two weeks ago and was cardioverted last week for AFib. He feels well today and offers no complaints. He wants a generic alternative to Viibryd.   Review of Systems  Constitutional: Negative for fever, chills, diaphoresis, activity change, appetite change, fatigue and unexpected weight change.  HENT: Negative.   Eyes: Negative.   Respiratory: Negative for cough, chest tightness, shortness of breath, wheezing and stridor.   Cardiovascular: Negative for chest pain, palpitations and leg swelling.  Gastrointestinal: Negative for nausea, abdominal pain, diarrhea, constipation, blood in stool and abdominal distention.  Genitourinary: Negative for dysuria, urgency, frequency, hematuria, flank pain, decreased urine volume, discharge, penile swelling, scrotal swelling, enuresis, difficulty urinating, genital sores, penile pain and testicular pain.  Musculoskeletal: Negative for myalgias, back pain, joint swelling, arthralgias and gait problem.  Skin: Negative for color change, pallor, rash and wound.  Neurological: Negative for dizziness, tremors, seizures, syncope, facial asymmetry, speech difficulty, weakness, light-headedness, numbness and headaches.  Hematological: Negative for adenopathy. Does not bruise/bleed easily.  Psychiatric/Behavioral: Negative.        Objective:   Physical Exam  Vitals reviewed. Constitutional: He is oriented to person, place, and time. He appears well-developed and well-nourished. No distress.  HENT:  Head: Normocephalic and atraumatic.  Mouth/Throat: Oropharynx is clear and moist. No oropharyngeal exudate.  Eyes: Conjunctivae are normal. Right eye exhibits no discharge. Left eye exhibits no discharge. No scleral icterus.  Neck: Normal range of motion. Neck supple. No JVD present. No tracheal deviation  present. No thyromegaly present.  Cardiovascular: Normal rate, regular rhythm, normal heart sounds and intact distal pulses.  Exam reveals no gallop and no friction rub.   No murmur heard. Pulmonary/Chest: Effort normal. No stridor. No respiratory distress. He has no wheezes. He has rales in the right lower field and the left lower field. He exhibits no tenderness.  Abdominal: Soft. Bowel sounds are normal. He exhibits no distension and no mass. There is no tenderness. There is no rebound and no guarding. Hernia confirmed negative in the right inguinal area and confirmed negative in the left inguinal area.  Genitourinary: Rectum normal, testes normal and penis normal. Rectal exam shows no external hemorrhoid, no internal hemorrhoid, no fissure, no mass, no tenderness and anal tone normal. Guaiac negative stool. Prostate is enlarged (1+ smooth bilateral BPH). Prostate is not tender. Right testis shows no mass, no swelling and no tenderness. Right testis is descended. Cremasteric reflex is not absent on the right side. Left testis shows no mass, no swelling and no tenderness. Left testis is descended. Cremasteric reflex is not absent on the left side. Circumcised. No penile tenderness. No discharge found.  Musculoskeletal: Normal range of motion. He exhibits edema (trace edema and venous insufficiency changes in BLE's). He exhibits no tenderness.  Lymphadenopathy:    He has no cervical adenopathy.       Right: No inguinal adenopathy present.       Left: No inguinal adenopathy present.  Neurological: He is alert and oriented to person, place, and time. He has normal reflexes. He displays normal reflexes. No cranial nerve deficit. He exhibits normal muscle tone. Coordination normal.  Skin: Skin is warm, dry and intact. Rash noted. No abrasion, no bruising, no burn, no ecchymosis, no laceration, no lesion, no petechiae and no purpura noted. Rash is  not macular, not papular, not maculopapular, not nodular, not  pustular, not vesicular and not urticarial. He is not diaphoretic. No cyanosis or erythema. No pallor. Nails show no clubbing.       He has vitiligo  Psychiatric: He has a normal mood and affect. His behavior is normal. Judgment and thought content normal.      Lab Results  Component Value Date   WBC 11.2* 11/23/2011   HGB 15.5 11/23/2011   HCT 47.3 11/23/2011   PLT 193.0 11/23/2011   GLUCOSE 93 11/23/2011   ALT 17 10/23/2010   AST 14 10/23/2010   NA 143 11/23/2011   K 4.5 11/23/2011   CL 108 11/23/2011   CREATININE 1.3 11/23/2011   BUN 14 11/23/2011   CO2 30 11/23/2011   TSH 2.15 11/23/2011   INR 0.98 09/02/2010     Assessment & Plan:

## 2011-12-09 ENCOUNTER — Telehealth: Payer: Self-pay

## 2011-12-09 DIAGNOSIS — E7800 Pure hypercholesterolemia, unspecified: Secondary | ICD-10-CM

## 2011-12-09 MED ORDER — ATORVASTATIN CALCIUM 80 MG PO TABS: 80.0000 mg | ORAL_TABLET | Freq: Every day | ORAL | Status: DC

## 2011-12-09 NOTE — Telephone Encounter (Signed)
Pt walked into clinic stating that [employer] will not cover Livalo. Covered alternative os generic Lipitor, please advise.

## 2011-12-09 NOTE — Telephone Encounter (Signed)
done

## 2011-12-09 NOTE — Telephone Encounter (Signed)
Pt advised of Rx/pharmacy 

## 2011-12-21 ENCOUNTER — Encounter: Payer: Self-pay | Admitting: Cardiovascular Disease

## 2011-12-29 ENCOUNTER — Ambulatory Visit (INDEPENDENT_AMBULATORY_CARE_PROVIDER_SITE_OTHER): Payer: BC Managed Care – PPO | Admitting: Cardiovascular Disease

## 2011-12-29 ENCOUNTER — Encounter: Payer: Self-pay | Admitting: Cardiovascular Disease

## 2011-12-29 VITALS — BP 140/70 | HR 46 | Ht 72.0 in | Wt 241.0 lb

## 2011-12-29 DIAGNOSIS — I4891 Unspecified atrial fibrillation: Secondary | ICD-10-CM

## 2011-12-29 DIAGNOSIS — I10 Essential (primary) hypertension: Secondary | ICD-10-CM

## 2011-12-29 MED ORDER — LISINOPRIL 10 MG PO TABS: 10.0000 mg | ORAL_TABLET | Freq: Every day | ORAL | Status: DC

## 2011-12-29 NOTE — Assessment & Plan Note (Signed)
[name] seems to be doing fairly well. His heart rate is slow but he has maintained sinus rhythm. We'll stop his bisoprolol. We'll discontinue his Pradaxa he'll continue taking his aspirin 325 mg a day. His CHADS scoring is only 1.  I see him again in 3 months for an office visit, basic metabolic profile, TSH, and EKG.

## 2011-12-29 NOTE — Assessment & Plan Note (Signed)
His blood pressure is okay but his heart rate is fairly slow. We will stop his bisoprolol and start him on lisinopril 10 mg a day. I'll see him again in 3 months for a followup office visit, basic metabolic profile, TSH, EKG.

## 2011-12-29 NOTE — Progress Notes (Addendum)
[name] Date of Birth  [birth year] [facility]     [place]  [address]    [address]   [address] [place], [place]  [address]    [place], [place]  [address] [phone]  Fax  [phone]  [phone]  Fax [phone]   Problem List: 1. Atrial Fibrillation 2. Lung Cancer - s/p LUL lobectomy, no chemo or XRT 3. Gout 4. COPD 5.  Hypothyroidism 6.    History of Present Illness:  [name] is a 61 yo with a recent Dx of A-fib.  He is fairly symptomatic. He is able to do all his normal activities without any significant problems. He occasionally has some rapid heart rate and palpitations but mostly he is asymptomatic.  He works as a Therapist, occupational routes.  He has been on antibiotics for a URI recently. He avoids salt.  He does not get regular exercise.    He was seen one month ago. He was found to have atrial fibrillation. He was also found to be over placed with his Synthroid. His TSH was 0.012 which was a year ago.  His Synthroid dose was cut in half. He returns today for followup visit.  We performed a cardioversion on him last month. He feels quite a bit better. He has noticed that his heart rate is fairly slow but he denies any syncope or presyncope. He comments that he sleeps a lot. He thinks that this is due to the Prozac.  Current Outpatient Prescriptions on File Prior to Visit  Medication Sig Dispense Refill  . amLODipine (NORVASC) 5 MG tablet Take 1 tablet by mouth Daily.      [name] aspirin 325 MG tablet Take 325 mg by mouth daily.        [name] atorvastatin (LIPITOR) 80 MG tablet Take 1 tablet (80 mg total) by mouth daily.  90 tablet  3  . Bioflavonoid Products (GRAPE SEED PO) Take by mouth 2 (two) times daily.      . bisoprolol-hydrochlorothiazide (ZIAC) 2.5-6.25 MG per tablet Take 1 tablet by mouth daily.        . dabigatran (PRADAXA) 150 MG CAPS Take 1 capsule (150 mg total) by mouth every 12 (twelve) hours.  60 capsule  5  .  finasteride (PROSCAR) 5 MG tablet Take 5 mg by mouth daily.      [name] FLUoxetine (PROZAC) 20 MG tablet Take 1 tablet (20 mg total) by mouth daily.  90 tablet  3  . levothyroxine (SYNTHROID, LEVOTHROID) 125 MCG tablet Take 1 tablet (125 mcg total) by mouth daily.  30 tablet  5  . sildenafil (VIAGRA) 100 MG tablet Take 1 tablet (100 mg total) by mouth as directed.  6 tablet  11  . tiotropium (SPIRIVA) 18 MCG inhalation capsule Place 18 mcg into inhaler and inhale daily.          No Known Allergies  Past Medical History  Diagnosis Date  . Streptococcal pneumonia August 2009  . Congestive heart failure   . Gastrointestinal obstruction     Ileus  . Pulmonary hypertension   . Polycythemia   . Hypothyroid   . COPD (chronic obstructive pulmonary disease)   . Obesity   . Lung cancer   . Gout   . BPH (benign prostatic hyperplasia) 2011  . Depression   . Hypertension     Past Surgical History  Procedure Date  . Appendectomy   . Back surgery   .  Lung cancer surgery 2011  . Cardioversion 11/27/2011    Procedure: CARDIOVERSION;  Surgeon: Vesta Mixer, MD;  Location: [facility];  Service: Cardiovascular;  Laterality: N/A;    History  Smoking status  . Former Smoker -- 3.0 packs/day  Smokeless tobacco  . Never Used    History  Alcohol Use No    Family History  Problem Relation Age of Onset  . Heart disease Father   . Cancer Neg Hx   . Diabetes Neg Hx   . Stroke Neg Hx   . Hypertension Neg Hx   . Kidney disease Neg Hx     Reviw of Systems:  Reviewed in the HPI.  All other systems are negative.  Physical Exam: Blood pressure 140/70, pulse 46, height 6' (1.829 m), weight 241 lb (109.317 kg). General: Well developed, well nourished, in no acute distress.  Head: Normocephalic, atraumatic, sclera non-icteric, mucus membranes are moist,   Neck: Supple. Negative for carotid bruits. JVD not elevated.  Lungs: Clear bilaterally to auscultation without wheezes, rales, or  rhonchi. Breathing is unlabored.  Heart: RR with S1 S2. No murmurs, rubs, or gallops appreciated.  Abdomen: Soft, non-tender, non-distended with normoactive bowel sounds. No hepatomegaly. No rebound/guarding. No obvious abdominal masses.  Msk:  Strength and tone appear normal for age.  Extremities: No clubbing or cyanosis. No edema.  Distal pedal pulses are 2+ and equal bilaterally.  Neuro: Alert and oriented X 3. Moves all extremities spontaneously.  Psych:  Responds to questions appropriately with a normal affect.  ECG: Marked sinus bradycardia at 37 beats a minute.  Assessment / Plan:

## 2011-12-29 NOTE — Patient Instructions (Signed)
Your physician recommends that you schedule a follow-up appointment in: 3 months    Your physician recommends that you return for lab work in: 3 months BMP/TSH/   Your physician has recommended you make the following change in your medication:   STOP BISOPROLOL STOP PRADAXA  START LISINOPRIL 10 MG DAILY FOR BLOOD PRESSURE

## 2012-02-10 ENCOUNTER — Encounter: Payer: Self-pay | Admitting: Internal Medicine

## 2012-02-10 ENCOUNTER — Ambulatory Visit (INDEPENDENT_AMBULATORY_CARE_PROVIDER_SITE_OTHER)
Admission: RE | Admit: 2012-02-10 | Discharge: 2012-02-10 | Disposition: A | Discharge status: Home / Self Care | Payer: BC Managed Care – PPO | Source: Ambulatory Visit | Attending: Internal Medicine | Admitting: Internal Medicine

## 2012-02-10 ENCOUNTER — Ambulatory Visit (INDEPENDENT_AMBULATORY_CARE_PROVIDER_SITE_OTHER): Payer: BC Managed Care – PPO | Admitting: Internal Medicine

## 2012-02-10 VITALS — BP 112/68 | HR 56 | Temp 98.1°F | Ht 72.0 in | Wt 246.2 lb

## 2012-02-10 DIAGNOSIS — J189 Pneumonia, unspecified organism: Secondary | ICD-10-CM | POA: Insufficient documentation

## 2012-02-10 HISTORY — DX: Pneumonia, unspecified organism: J18.9

## 2012-02-10 NOTE — Assessment & Plan Note (Signed)
He says he has had 3 episodes of pneumonia for 2013 but I do not see it on cxr march 2013 and 02/10/2012. There is report of pna in cxr jan 2013. I am not sure if he is mistaking aecopd for pna. CT chest dec 2012 and cxr 02/10/2012 are unchanged and free of focal disease. I will have him repeat swallow study again.I will let him know  > 50% of this > 15 min visit spent in face to face counseling

## 2012-02-10 NOTE — Patient Instructions (Signed)
Please have chest xray today Depending results, will advise you about ct chest or repeat swallow study

## 2012-02-10 NOTE — Progress Notes (Signed)
Subjective:    Patient ID: [name], male    DOB: 10-02-1950, 61 y.o.   MRN: [id number]  HPI 61 year old male. Ex-smoker. Ex-etoh. [name] Pulm patient since august 2009  # admitted to ICU for STrep PNA bilaeral LL consolidation and s/p APRV summer 2009 #  Gold stage 2 COPD MM genotype.  # S/p LUL wedge resection for stage 1Asquamous cell lung cancer [name]) December 2011.  # Recurrent RML and RLL pna due to dysphagia/aspoiration  -  in Jan and Feb 2012 with subsequent clearance April 2012 cxr, Dec 2012 CT   - Oct 17, 2011 - [facility] report as bilateral pna -> No evidence March 2013 [name] cxr  OV 04/17/2011: Followup for COPD, recurrent pneumonia and lung cancer. Doing well. 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 Reports stable health. No dysphagia. STates speech cleared him to eat normally. Denies dyspnea or cough or chest pain. Very active. Doing work. No weight loss. States he is seeing a cancer doctor in [place] for post lung cancer surgery surveillance. Had CT chest last wek reportedly (6th month CT) and this was normal per report.   OV 09/30/2011 Followup for COPD, recurrent pneumonia due to dysphagia, hospitalizatin, and lung cancer surveillance. Feels well. No complaints. Compliant with medications. No dysphagia. Wondering why hee has to do cancer surveillance in [place] and wondering if he can consolidate it here. Denies dyspnea, cough, chest pain, sputum. Only issue is persistent post nasal drip: not using medications  Had CT Ct Chest Wo Contrast 09/17/11 (see detailed rport below). Overall clearance of pneumonia since Jan 2012. [name] is a residual RUL density and lingular density that to me is stable since Sept 2011 (15 month stability). I confirmed this with radiologist on call at day Dr [name] and feel that next CT can be done in 1 year    09/17/2011  *RADIOLOGY REPORT*  Clinical Data: Left lung cancer, history of left upper lobectomy December 2011.   Restaging.  CT CHEST WITHOUT CONTRAST  Technique:  Multidetector CT imaging of the chest was performed following the standard protocol without IV contrast.  Comparison: Chest radiograph 01/06/2011 at [facility] location, chest CT 10/19/2010 at [facility]  Findings: Unenhanced CT was performed per clinician order.  Lack of IV contrast limits sensitivity and specificity.  Interval decrease in previously seen enlarged lymph nodes, with representative AP window lymph node now measuring 0.7 cm image 25, previously 1.1 cm at the same anatomic level.  Pretracheal previously measured lymph node now measures 0.5 cm in short axis diameter image 24, previously 1.0 cm at the same anatomic level. No new lymphadenopathy.  Atherosclerotic aortic calcification noted without aneurysm.  Coronary arterial calcification again noted, moderate.  Trace pericardial fluid is new.  No pleural effusion. Calcified right basilar pleural plaque again noted.  Minimal patchy predominately right upper lobe tree in bud nodular opacity is noted.  Overall, the other areas of patchy airspace nodular opacity have essentially resolved.  There is an area of pleural based wedge-shaped nodularity in the lingula measuring 1.3 cm on image 41.  This area was previously degraded by motion artifact, but is subjectively not likely to be significantly changed.  Evidence of partial left upper lobectomy again noted.  No nodularity or mass is seen along the resection margin.  Central airways are patent.  No new osseous abnormality.  IMPRESSION: Near complete interval resolution of previously seen areas of patchy nodular airspace opacity.  A  small area of tree in bud type nodular opacity could reflect small airways infectious etiology in the right upper lobe.  A focal area of lymphangitic spread of tumor is considered much less likely.  Stable pleural parenchymal nodularity in the lingula.  Interval resolution of previously  seen lymphadenopathy, most likely previously reactive.  Original Report Authenticated By: [name], M.D.   #COPD  - disease is stable  - continue spiriva  - nurse will check alpha 1 anti-trypsin genetic test  #Swalllowing  - follow speech therapy guidelines  #lung cancer  - I will talk to Dr Danton Sewer about lung cancer surveillance - if she is ok with it I will do it  -> I she is ok with med oing #Nasal drainage  - start generic flonase 2 squirts into each nostril daily  #Followup  - 6 months for copd - do spirometry and walk test at followup  - dec 2013 with CT chest wihtout contrast for cancer surveilance  OV 02/10/2012  3 episodes clinical aecopd but he feels this was pna; one time with PMD cxr reportedly showed pna. Review of my recrds indicate this was in Jan 2013 when he he went to [facility] and cxr reported as bilateral infiltrates (not avaialble for my review). Since then he has had a cxr here march 2013 which was free of pneumonia. Last week felt bad. . HE is worried about this as pneumonia recurrence. Now at baseline. CAT score today is 16. Denis aspiration. Denies choking   CAT COPD Symptom and Quality of Life Score (glaxo smith kline trademark)  0 (no burden) to 5 (highest burden)  Never Cough -> Cough all the time 1  No phlegm in chest -> Chest is full of phlegm 4  No chest tightness -> Chest feels very tight 2  No dyspnea for 1 flight stairs/hill -> Very dyspneic for 1 flight of stairs 3  No limitations for ADL at home -> Very limited with ADL at home 2  Confident leaving home -> Not at all confident leaving home 1  Sleep soundly -> Do not sleep soundly because of lung condition 1  Lots of Energy -> No energy at all 2  TOTAL Score (max 40)  16   Past, Family, Social reviewed: no change since last visit    Review of Systems  Constitutional: Negative for fever and unexpected weight change.  HENT: Negative for ear pain, nosebleeds, congestion, sore  throat, rhinorrhea, sneezing, trouble swallowing, dental problem, postnasal drip and sinus pressure.   Eyes: Negative for redness and itching.  Respiratory: Negative for cough, chest tightness, shortness of breath and wheezing.   Cardiovascular: Negative for palpitations and leg swelling.  Gastrointestinal: Negative for nausea and vomiting.  Genitourinary: Negative for dysuria.  Musculoskeletal: Negative for joint swelling.  Skin: Negative for rash.  Neurological: Negative for headaches.  Hematological: Does not bruise/bleed easily.  Psychiatric/Behavioral: Negative for dysphoric mood. The patient is not nervous/anxious.        Objective:   Physical Exam Nursing note and vitals reviewed. Constitutional: He is oriented to person, place, and time. He appears well-developed and well-nourished. No distress.       Body mass index is 33.34 kg/(m^2). Body mass index is 33.34 kg/(m^2).    HENT:  Head: Normocephalic and atraumatic.  Right Ear: External ear normal.  Left Ear: External ear normal.  Mouth/Throat: Oropharynx is clear and moist. No oropharyngeal exudate.  Eyes: Conjunctivae and EOM are normal. Pupils are  equal, round, and reactive to light. Right eye exhibits no discharge. Left eye exhibits no discharge. No scleral icterus.  Neck: Normal range of motion. Neck supple. No JVD present. No tracheal deviation present. No thyromegaly present.  Cardiovascular: Normal rate, regular rhythm and intact distal pulses.  Exam reveals no gallop and no friction rub.   No murmur heard. Pulmonary/Chest: Effort normal and breath sounds normal. No respiratory distress. He has no wheezes. He has no rales. He exhibits no tenderness.  Abdominal: Soft. Bowel sounds are normal. He exhibits no distension and no mass. There is no tenderness. There is no rebound and no guarding.  Musculoskeletal: Normal range of motion. He exhibits no edema and no tenderness.  Lymphadenopathy:    He has no cervical  adenopathy.  Neurological: He is alert and oriented to person, place, and time. He has normal reflexes. No cranial nerve deficit. Coordination normal.  Skin: Skin is warm and dry. No rash noted. He is not diaphoretic. No erythema. No pallor.  Psychiatric: He has a normal mood and affect. His behavior is normal. Judgment and thought content normal.           Assessment & Plan:

## 2012-02-11 ENCOUNTER — Telehealth: Payer: Self-pay | Admitting: Internal Medicine

## 2012-02-11 NOTE — Telephone Encounter (Signed)
  Spoke to patient: CXR - no pneumonia. He wants to watch it and does not want to do swallow study now but will do if he has pna again. I wil see him in dec 2013 at time of CT [employer] Shawna Orleans will put him in compuater system for fu letter)    Dg Chest 2 View  02/10/2012  *RADIOLOGY REPORT*  Clinical Data: Recurrent pneumonia and COPD.  CHEST - 2 VIEW  Comparison: 03/14/2013and CT chest from 09/17/2011  Findings: The cardiopericardial silhouette is enlarged. No edema or focal airspace consolidation. Cardiopericardial silhouette is at upper limits of normal for size.  Pleuroparenchymal scarring at the left base is stable since the CT scan.  There is also some linear scarring in the medial left apex.  IMPRESSION: No acute findings.  Original Report Authenticated By: ERIC A. MANSELL, M.D.

## 2012-03-30 ENCOUNTER — Encounter: Payer: Self-pay | Admitting: Cardiovascular Disease

## 2012-03-30 ENCOUNTER — Ambulatory Visit (INDEPENDENT_AMBULATORY_CARE_PROVIDER_SITE_OTHER): Payer: BC Managed Care – PPO | Admitting: Cardiovascular Disease

## 2012-03-30 VITALS — BP 136/73 | HR 45 | Ht 72.0 in | Wt 230.8 lb

## 2012-03-30 DIAGNOSIS — I10 Essential (primary) hypertension: Secondary | ICD-10-CM

## 2012-03-30 DIAGNOSIS — I4891 Unspecified atrial fibrillation: Secondary | ICD-10-CM

## 2012-03-30 DIAGNOSIS — E039 Hypothyroidism, unspecified: Secondary | ICD-10-CM | POA: Insufficient documentation

## 2012-03-30 HISTORY — DX: Hypothyroidism, unspecified: E03.9

## 2012-03-30 LAB — BASIC METABOLIC PANEL
Calcium: 9.2 mg/dL (ref 8.4–10.5)
Creatinine, Ser: 1.1 mg/dL (ref 0.4–1.5)
GFR: 76.19 mL/min (ref >60.00)
Sodium: 139 mEq/L (ref 135–145)

## 2012-03-30 NOTE — Patient Instructions (Addendum)
Your physician recommends that you return for lab work in: today bmet  Your physician wants you to follow-up in: 6 months with ekg You will receive a reminder letter in the mail two months in advance. If you don't receive a letter, please call our office to schedule the follow-up appointment.  Your physician recommends that you return for a FASTING lipid profile: 6 months

## 2012-03-30 NOTE — Progress Notes (Signed)
[name] Date of Birth  02/19/1951 [name]     [name]  [address]    [address]   [address] [place], [place]  [address]    [place], [place]  [address] [phone]  Fax  [phone]  [phone]  Fax [phone]   Problem List: 1. Atrial Fibrillation 2. Lung Cancer - s/p LUL lobectomy, no chemo or XRT 3. Gout 4. COPD 5.  Hypothyroidism   History of Present Illness:  [name] is a 61 yo with a recent Dx of A-fib.  He is fairly asymptomatic. He is able to do all his normal activities without any significant problems. He occasionally has some rapid heart rate and palpitations but mostly he is asymptomatic.  He works as a Therapist, occupational routes.   He was seen several months ago. He was found to have atrial fibrillation. He was also found to be over re-placed with his Synthroid. His TSH was 0.012 which was a year ago.  His Synthroid dose was cut in half. He returns today for followup visit.  We performed a cardioversion on him in March, 2013. He feels quite a bit better. He has noticed that his heart rate is fairly slow but he denies any syncope or presyncope. He comments that he sleeps a lot. He thinks that this is due to the Prozac.    Current Outpatient Prescriptions on File Prior to Visit  Medication Sig Dispense Refill  . amLODipine (NORVASC) 5 MG tablet Take 1 tablet by mouth Daily.      [name] aspirin 325 MG tablet Take 325 mg by mouth daily.        [name] atorvastatin (LIPITOR) 80 MG tablet Take 1 tablet (80 mg total) by mouth daily.  90 tablet  3  . Bioflavonoid Products (GRAPE SEED PO) Take by mouth 2 (two) times daily.      . finasteride (PROSCAR) 5 MG tablet Take 5 mg by mouth daily.      [name] HYDROcodone-acetaminophen (NORCO) 10-325 MG per tablet Take 1 tablet by mouth every 4 (four) hours as needed.       [name] levothyroxine (SYNTHROID, LEVOTHROID) 125 MCG tablet Take 1 tablet (125 mcg total) by mouth daily.  30 tablet  5  . lisinopril  (PRINIVIL,ZESTRIL) 10 MG tablet Take 1 tablet (10 mg total) by mouth daily.  30 tablet  11  . sildenafil (VIAGRA) 100 MG tablet Take 1 tablet (100 mg total) by mouth as directed.  6 tablet  11  . tiotropium (SPIRIVA) 18 MCG inhalation capsule Place 18 mcg into inhaler and inhale daily.          No Known Allergies  Past Medical History  Diagnosis Date  . Streptococcal pneumonia August 2009  . Congestive heart failure   . Gastrointestinal obstruction     Ileus  . Pulmonary hypertension   . Polycythemia   . Hypothyroid   . COPD (chronic obstructive pulmonary disease)   . Obesity   . Lung cancer   . Gout   . BPH (benign prostatic hyperplasia) 2011  . Depression   . Hypertension     Past Surgical History  Procedure Date  . Appendectomy   . Back surgery   . Lung cancer surgery 2011  . Cardioversion 11/27/2011    Procedure: CARDIOVERSION;  Surgeon: Vesta Mixer, MD;  Location: [facility] ENDOSCOPY;  Service: Cardiovascular;  Laterality: N/A;    History  Smoking status  . Former Smoker --  3.0 packs/day  Smokeless tobacco  . Never Used    History  Alcohol Use No    Family History  Problem Relation Age of Onset  . Heart disease Father   . Cancer Neg Hx   . Diabetes Neg Hx   . Stroke Neg Hx   . Hypertension Neg Hx   . Kidney disease Neg Hx     Reviw of Systems:  Reviewed in the HPI.  All other systems are negative.  Physical Exam: Blood pressure 136/73, pulse 45, height 6' (1.829 m), weight 230 lb 12.8 oz (104.69 kg). General: Well developed, well nourished, in no acute distress.  Head: Normocephalic, atraumatic, sclera non-icteric, mucus membranes are moist,   Neck: Supple. Negative for carotid bruits. JVD not elevated.  Lungs: Clear bilaterally to auscultation without wheezes, rales, or rhonchi. Breathing is unlabored.  Heart: Regular rate with S1 S2. No murmurs, rubs, or gallops appreciated.  Abdomen: Soft, non-tender, non-distended with normoactive bowel  sounds. No hepatomegaly. No rebound/guarding. No obvious abdominal masses.  Msk:  Strength and tone appear normal for age.  Extremities: No clubbing or cyanosis. No edema.  Distal pedal pulses are 2+ and equal bilaterally.  Neuro: Alert and oriented X 3. Moves all extremities spontaneously.  Psych:  Responds to questions appropriately with a normal affect.  ECG: 03/30/2012 sinus bradycardia at 49 beats a minute. EKG is otherwise unremarkable.  Assessment / Plan:

## 2012-03-30 NOTE — Assessment & Plan Note (Signed)
[name]'s blood pressure is well-controlled. We'll continue with current medications. We'll check a basic metabolic profile today after initiating the lisinopril during his last visit.

## 2012-03-30 NOTE — Assessment & Plan Note (Signed)
His thyroid levels have been normal. This is now being followed by his general medical Dr.

## 2012-03-30 NOTE — Assessment & Plan Note (Signed)
Davin Has remained in normal sinus rhythm since his cardioversion. He's not had any difficulties. Still remained bradycardic. We'll continue the same medications. I'll see him again in 6 months for followup office visit and EKG.

## 2012-08-29 ENCOUNTER — Encounter: Payer: Self-pay | Admitting: Internal Medicine

## 2012-08-29 ENCOUNTER — Ambulatory Visit (INDEPENDENT_AMBULATORY_CARE_PROVIDER_SITE_OTHER): Payer: BC Managed Care – PPO | Admitting: Internal Medicine

## 2012-08-29 VITALS — BP 126/84 | HR 60 | Temp 98.3°F | Ht 72.0 in | Wt 238.0 lb

## 2012-08-29 DIAGNOSIS — M109 Gout, unspecified: Secondary | ICD-10-CM

## 2012-08-29 DIAGNOSIS — J189 Pneumonia, unspecified organism: Secondary | ICD-10-CM

## 2012-08-29 DIAGNOSIS — C3490 Malignant neoplasm of unspecified part of unspecified bronchus or lung: Secondary | ICD-10-CM

## 2012-08-29 DIAGNOSIS — J449 Chronic obstructive pulmonary disease, unspecified: Secondary | ICD-10-CM

## 2012-08-29 HISTORY — DX: Gout, unspecified: M10.9

## 2012-08-29 MED ORDER — HYDROCODONE-ACETAMINOPHEN 5-500 MG PO TABS: 1.0000 | ORAL_TABLET | Freq: Four times a day (QID) | ORAL | Status: DC | PRN

## 2012-08-29 NOTE — Patient Instructions (Addendum)
#  COPD  -  Continue your medications  - glad you had flu shot  - your next pneumovax shot is in 2014 (5 years since 2009)  #Lung cancer surveillance  - have CT chest  Before end of this month; will call you with results  #Gouty attack  - given short supply of vicodin. Please follow with PCP HOOPER,JEFFREY C, MD for furhter treatment  #REcurrent pneumonia  - let us watch this to see how you are faring on the omeprazole  #Followup  8 months or sooner if needed

## 2012-08-29 NOTE — Assessment & Plan Note (Signed)
New acute reccurrrent gouty attack. Will fill short supply vicodin. Rest from PMD HOOPER,JEFFREY C, MD

## 2012-08-29 NOTE — Assessment & Plan Note (Signed)
Stable disease. CAT score has improved from 16 to 7.  Plan Continue spiriva

## 2012-08-29 NOTE — Progress Notes (Signed)
Subjective:    Patient ID: [name], male    DOB: 1951-06-14, 61 y.o.   MRN: [id number]  HPI 61 year old male. Ex-smoker. Ex-etoh. [name] Pulm patient since august 2009  # admitted to ICU for STrep PNA bilaeral LL consolidation and s/p APRV summer 2009 #  Gold stage 2 COPD MM genotype.  # S/p LUL wedge resection for stage 1Asquamous cell lung cancer [name]) December 2011.   - dec 2012 CT chest: no recurrence  - cxr May 2013: clear # Recurrent RML and RLL pna due to dysphagia/aspoiration  -  in Jan and Feb 2012 with subsequent clearance April 2012 cxr, Dec 2012 CT   - Oct 17, 2011 - [facility] report as bilateral pna -> No evidence March 2013 [facility] cxr   #Nasal drip  - flonase prn  OV 02/10/2012  3 episodes clinical aecopd but he feels this was pna; one time with PMD cxr reportedly showed pna. Review of my recrds indicate this was in Jan 2013 when he he went to [facility] and cxr reported as bilateral infiltrates (not avaialble for my review). Since then he has had a cxr here march 2013 which was free of pneumonia. Last week felt bad. . HE is worried about this as pneumonia recurrence. Now at baseline. CAT score today is 16. Denis aspiration. Denies choking  Past, Family, Social reviewed: no change since last visit  Please have chest xray today : clear he wants to postpone to swallow study if there is pna again     OV 08/29/2012  Last visit May 2013. Currently fu for above issues  COPD: stable. CAT score 7 and improved from score 16. Feels well  Lung cancer surveillance: due for 2nd year CT chest now. Wants his CT chest before end of 2013  REcurrent pneumonia: he thinks he might have a touch of pneumonia in the fall 2013. Also reports nocturnal regurg symptoms. PCP placed on PPI omeprazole with complete resolution of symptoms. Denies ongoing associated dysphagia  Other issues  - wrist, ankles, MTP joint right index finger : gouty flare x 1 week  (known gout x 2 years on and off). Pain moderate. Vicodin helps but currently run out. Acute onset. Stable since onset. No associated fever  CAT COPD Symptom and Quality of Life Score (glaxo smith kline trademark)   May 2013 08/29/2012   Never Cough -> Cough all the time 1 1  No phlegm in chest -> Chest is full of phlegm 4 3  No chest tightness -> Chest feels very tight 2 0  No dyspnea for 1 flight stairs/hill -> Very dyspneic for 1 flight of stairs 3 3  No limitations for ADL at home -> Very limited with ADL at home 2 0  Confident leaving home -> Not at all confident leaving home 1 0  Sleep soundly -> Do not sleep soundly because of lung condition 1 0  Lots of Energy -> No energy at all 2 0  TOTAL Score (max 40)  16 7    Past, Family, Social reviewed: no change since last visit  Review of Systems  Constitutional: Negative for fever and unexpected weight change.  HENT: Negative for ear pain, nosebleeds, congestion, sore throat, rhinorrhea, sneezing, trouble swallowing, dental problem, postnasal drip and sinus pressure.   Eyes: Negative for redness and itching.  Respiratory: Negative for cough, chest tightness, shortness of breath and wheezing.   Cardiovascular: Negative for palpitations and leg swelling.  Gastrointestinal: Negative for nausea and vomiting.  Genitourinary: Negative for dysuria.  Musculoskeletal: Negative for joint swelling.  Skin: Negative for rash.  Neurological: Negative for headaches.  Hematological: Does not bruise/bleed easily.  Psychiatric/Behavioral: Negative for dysphoric mood. The patient is not nervous/anxious.    Past, Family, Social reviewed:as above   Objective:   Physical Exam Nursing note and vitals reviewed. Constitutional: He is oriented to person, place, and time. He appears well-developed and well-nourished. No distress.       Body mass index is 33.34 kg/(m^2). Body mass index is 33.34 kg/(m^2). - May 2013 Body mass index is 32.28 kg/(m^2). on  08/29/2012     HENT:  Head: Normocephalic and atraumatic.  Right Ear: External ear normal.  Left Ear: External ear normal.  Mouth/Throat: Oropharynx is clear and moist. No oropharyngeal exudate.  Eyes: Conjunctivae and EOM are normal. Pupils are equal, round, and reactive to light. Right eye exhibits no discharge. Left eye exhibits no discharge. No scleral icterus.  Neck: Normal range of motion. Neck supple. No JVD present. No tracheal deviation present. No thyromegaly present.  Cardiovascular: Normal rate, regular rhythm and intact distal pulses.  Exam reveals no gallop and no friction rub.   No murmur heard. Pulmonary/Chest: Effort normal and breath sounds normal. No respiratory distress. He has no wheezes. He has no rales. He exhibits no tenderness.  Abdominal: Soft. Bowel sounds are normal. He exhibits no distension and no mass. There is no tenderness. There is no rebound and no guarding.  Musculoskeletal: Normal range of motion. He exhibits no edema and no tenderness.  Lymphadenopathy:    He has no cervical adenopathy.  Neurological: He is alert and oriented to person, place, and time. He has normal reflexes. No cranial nerve deficit. Coordination normal.  Skin: Skin is warm and dry. No rash noted. He is not diaphoretic. No erythema. No pallor.  Psychiatric: He has a normal mood and affect. His behavior is normal. Judgment and thought content normal.          Assessment & Plan:

## 2012-08-29 NOTE — Assessment & Plan Note (Signed)
Do 2nd year cancer surveillance CT chest dec 2013

## 2012-08-29 NOTE — Assessment & Plan Note (Signed)
Appears his symptoms have resolved after PCP starteed him on PPI. Will continue to monitor. Will see if dec 2013 ct chest gives any pertinent inf0o

## 2012-08-30 ENCOUNTER — Ambulatory Visit (INDEPENDENT_AMBULATORY_CARE_PROVIDER_SITE_OTHER)
Admission: RE | Admit: 2012-08-30 | Discharge: 2012-08-30 | Disposition: A | Discharge status: Home / Self Care | Payer: BC Managed Care – PPO | Source: Ambulatory Visit | Attending: Internal Medicine | Admitting: Internal Medicine

## 2012-08-30 DIAGNOSIS — C3490 Malignant neoplasm of unspecified part of unspecified bronchus or lung: Secondary | ICD-10-CM

## 2012-08-30 DIAGNOSIS — J189 Pneumonia, unspecified organism: Secondary | ICD-10-CM

## 2012-09-06 ENCOUNTER — Telehealth: Payer: Self-pay | Admitting: Internal Medicine

## 2012-09-06 NOTE — Telephone Encounter (Signed)
Pt notified of ct results per Dr Marchelle Gearing.

## 2012-09-06 NOTE — Telephone Encounter (Signed)
Returning call can be reached at [phone].[name]

## 2012-09-06 NOTE — Telephone Encounter (Signed)
LMTC x 1  

## 2012-11-05 ENCOUNTER — Other Ambulatory Visit: Payer: Self-pay

## 2012-11-20 ENCOUNTER — Other Ambulatory Visit: Payer: Self-pay | Admitting: Internal Medicine

## 2013-01-26 ENCOUNTER — Telehealth: Payer: Self-pay | Admitting: Internal Medicine

## 2013-01-26 DIAGNOSIS — J449 Chronic obstructive pulmonary disease, unspecified: Secondary | ICD-10-CM

## 2013-01-26 NOTE — Telephone Encounter (Signed)
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 Marchelle Gearing please advise on breathing test and note, thank you!  LAST OV: 08/29/12 w 8 month follow up

## 2013-01-27 NOTE — Telephone Encounter (Addendum)
I spoke with the pt and he states they need recent numbers so I scheduled a PFT at [facility] for Monday at 2pm, pt to arrive at 1:45.  Pt is aware of appt. Carron Curie, CMA

## 2013-01-27 NOTE — Telephone Encounter (Signed)
Closed by mistake

## 2013-01-27 NOTE — Addendum Note (Signed)
Addended by: Darrell Jewel on: 01/27/2013 10:48 AM   Modules accepted: Orders

## 2013-01-27 NOTE — Telephone Encounter (Signed)
[name] on 12/03/2008 shows Gold Stage 2 copd - FEv1 2.4L/70%  But aftter than in 2011 end he had wedge resection. He has been doing well. IS there a time frame within which his employer wants spirometry or they do not care and they just want a number ? BAsed on 2010 numbers his fev1 is > 65%. IF they want more recent numbers then you need to order full PFT   Let me know  Thanks  Dr. Kalman Shan, M.D., [facility].C.P Pulmonary and Critical Care Medicine Staff Physician [facility]  Pulmonary and Critical Care Pager: [phone], If no answer or between  15:00h - 7:00h: call [phone]  01/27/2013 6:26 AM

## 2013-01-30 ENCOUNTER — Ambulatory Visit (HOSPITAL_COMMUNITY)
Admission: RE | Admit: 2013-01-30 | Discharge: 2013-01-30 | Disposition: A | Discharge status: Home / Self Care | Payer: BC Managed Care – PPO | Source: Ambulatory Visit | Attending: Internal Medicine | Admitting: Internal Medicine

## 2013-01-30 DIAGNOSIS — Z87891 Personal history of nicotine dependence: Secondary | ICD-10-CM | POA: Insufficient documentation

## 2013-01-30 DIAGNOSIS — J4489 Other specified chronic obstructive pulmonary disease: Secondary | ICD-10-CM | POA: Insufficient documentation

## 2013-01-30 DIAGNOSIS — J449 Chronic obstructive pulmonary disease, unspecified: Secondary | ICD-10-CM

## 2013-01-30 LAB — PULMONARY FUNCTION TEST

## 2013-01-30 MED ORDER — ALBUTEROL SULFATE (5 MG/ML) 0.5% IN NEBU
2.5000 mg | INHALATION_SOLUTION | Freq: Once | RESPIRATORY_TRACT | Status: AC
  Administered 2013-01-30: 2.5 mg via RESPIRATORY_TRACT

## 2013-01-31 ENCOUNTER — Telehealth: Payer: Self-pay | Admitting: Internal Medicine

## 2013-01-31 ENCOUNTER — Encounter: Payer: Self-pay | Admitting: *Deleted

## 2013-01-31 NOTE — Telephone Encounter (Signed)
PFT requested and placed on MR look-at. Carron Curie, CMA

## 2013-01-31 NOTE — Telephone Encounter (Signed)
Pulmonary function test done on 01/30/2013 for patient [name] with 12-Aug-1951 shows only mild obstructive lung disease. His FEV1 postbronchodilator is 2.3 L/73%. FVC is 3.8 L/94%. Ratio 60. Total lung capacities 110% and DLCO is 80%.. he does not have any bronchodilator response.  Please send him a note with the above results. You can put this into his DOT physical and I can sign this   Dr. Kalman Shan, M.D., [facility].C.P Pulmonary and Critical Care Medicine Staff Physician [place]  Pulmonary and Critical Care Pager: [phone], If no answer or between  15:00h - 7:00h: call [phone]  01/31/2013 2:08 PM

## 2013-01-31 NOTE — Telephone Encounter (Signed)
Letter is completed and placed at front. Pt is aware. Carron Curie, CMA

## 2013-01-31 NOTE — Telephone Encounter (Signed)
Called, spoke with pt.   States he needed to have a PFT as required for his DOT physical. PFT was done yesterday at [facility]. Pt states he needs a letter stating the results of the test so he can take it to along with other papers. He would like to pick this letter up today. Reports he is out of work until he can get this.  MR, pls advise on results and if ok to put these in letter for his DOT physical. Thank yo.

## 2013-07-27 ENCOUNTER — Other Ambulatory Visit: Payer: Self-pay

## 2014-07-06 ENCOUNTER — Other Ambulatory Visit: Payer: Self-pay

## 2015-08-26 ENCOUNTER — Ambulatory Visit: Payer: Self-pay | Admitting: Adult Health

## 2015-08-27 ENCOUNTER — Ambulatory Visit (INDEPENDENT_AMBULATORY_CARE_PROVIDER_SITE_OTHER): Payer: PPO | Admitting: Adult Health

## 2015-08-27 ENCOUNTER — Encounter: Payer: Self-pay | Admitting: Adult Health

## 2015-08-27 ENCOUNTER — Ambulatory Visit (INDEPENDENT_AMBULATORY_CARE_PROVIDER_SITE_OTHER)
Admission: RE | Admit: 2015-08-27 | Discharge: 2015-08-27 | Disposition: A | Discharge status: Home / Self Care | Payer: PPO | Source: Ambulatory Visit | Attending: Adult Health | Admitting: Adult Health

## 2015-08-27 VITALS — BP 126/80 | HR 82 | Temp 98.2°F | Ht 72.0 in | Wt 228.0 lb

## 2015-08-27 DIAGNOSIS — J449 Chronic obstructive pulmonary disease, unspecified: Secondary | ICD-10-CM

## 2015-08-27 DIAGNOSIS — C3492 Malignant neoplasm of unspecified part of left bronchus or lung: Secondary | ICD-10-CM | POA: Diagnosis not present

## 2015-08-27 DIAGNOSIS — J9611 Chronic respiratory failure with hypoxia: Secondary | ICD-10-CM

## 2015-08-27 HISTORY — DX: Chronic respiratory failure with hypoxia: J96.11

## 2015-08-27 MED ORDER — TIOTROPIUM BROMIDE MONOHYDRATE 18 MCG IN CAPS: 18.0000 ug | ORAL_CAPSULE | Freq: Every day | RESPIRATORY_TRACT | Status: DC

## 2015-08-27 NOTE — Patient Instructions (Signed)
Finish Augmentin .  Restart Spiriva daily  Wear oxygen 2l/m with activity and At bedtime   Order sent for portable concentrator to [facility] Get Flu shot next week .  Set up for CT chest .  follow up Dr. Chase Caller in 3 months and As needed   Please contact office for sooner follow up if symptoms do not improve or worsen or seek emergency care

## 2015-08-27 NOTE — Addendum Note (Signed)
Addended by: Osa Craver on: 08/27/2015 11:10 AM   Modules accepted: Orders

## 2015-08-27 NOTE — Progress Notes (Signed)
Subjective:    Patient ID: [name], male    DOB: 1951-06-02, 64 y.o.   MRN: [id number]  HPI 64 yo former smoker with COPD GOLD II , MM genotype and previous lung cancer 2011   TEST /Event  # S/p LUL wedge resection for stage 1Asquamous cell lung cancer [name]) December 2011.  - dec 2012 CT chest: no recurrence - cxr May 2013: clear # Recurrent RML and RLL pna due to dysphagia/aspoiration - in Jan and Feb 2012 with subsequent clearance April 2012 cxr, Dec 2012 CT - Oct 17, 2011 - [facility] report as bilateral pna -> No evidence March 2013 [place] cxr  CT chest 2013 , no evidence of local recurrent or mets , stable scarring in both lungs. Tree in bud in RUL ? Post inflammatory.    08/27/2015 Acute OV : COPD  Pt presents for a 2-3 weeks of prod cough with yellow mucus, sinus pressure/drainage,SOB and decrease in O2 levels.  Seen by PCP , started on Augmentin and given Depo Medrol shot . He feels so much better today .  He was also started on Oxygen with activity and At bedtime  .  Needs order for portable concentrator so he can take with him .  CXR today with COPD changes, no acute process Has not been taking Spirva , needs rx. Feels this really helped when he took.  Hx of lung cancer. S/p LUL wedge resection in 2011 , last CT chest 2013 w/ no reoccurrence.  Denies any hemoptysis, chest pain, orthopnea, PND, or increased leg swelling.      Past Medical History  Diagnosis Date  . Streptococcal pneumonia [facility]) August 2009  . Congestive heart failure ([place])   . Gastrointestinal obstruction (HCC)     Ileus  . Pulmonary hypertension ([place])   . Polycythemia   . Hypothyroid   . COPD (chronic obstructive pulmonary disease) ([place])   . Obesity   . Lung cancer ([place])   . Gout   . BPH (benign prostatic hyperplasia) 2011  . Depression   . Hypertension    Current Outpatient Prescriptions on File Prior to Visit    Medication Sig Dispense Refill  . amLODipine (NORVASC) 5 MG tablet Take 1 tablet by mouth Daily.    [name] aspirin 325 MG tablet Take 325 mg by mouth daily.      . finasteride (PROSCAR) 5 MG tablet Take 5 mg by mouth daily.    [name] HYDROcodone-acetaminophen (NORCO) 10-325 MG per tablet Take 1 tablet by mouth every 4 (four) hours as needed.     [name] lisinopril (PRINIVIL,ZESTRIL) 10 MG tablet Take 1 tablet (10 mg total) by mouth daily. 30 tablet 11  . omeprazole (PRILOSEC) 20 MG capsule Take 1 tablet by mouth Twice daily.    [name] atorvastatin (LIPITOR) 80 MG tablet TAKE 1 TABLET BY MOUTH EVERY DAY (Patient not taking: Reported on 08/27/2015) 90 tablet 3  . Bioflavonoid Products (GRAPE SEED PO) Take by mouth 2 (two) times daily.    [name] HYDROcodone-acetaminophen (VICODIN) 5-500 MG per tablet Take 1 tablet by mouth every 6 (six) hours as needed for pain (gout). (Patient not taking: Reported on 08/27/2015) 28 tablet 0  . sildenafil (VIAGRA) 100 MG tablet Take 1 tablet (100 mg total) by mouth as directed. (Patient not taking: Reported on 08/27/2015) 6 tablet 11  . tiotropium (SPIRIVA) 18 MCG inhalation capsule Place 18 mcg into inhaler and inhale daily.       No current  facility-administered medications on file prior to visit.      Review of Systems Constitutional:   No  weight loss, night sweats,  Fevers, chills, fatigue, or  lassitude.  HEENT:   No headaches,  Difficulty swallowing,  Tooth/dental problems, or  Sore throat,                No sneezing, itching, ear ache, nasal congestion, post nasal drip,   CV:  No chest pain,  Orthopnea, PND, swelling in lower extremities, anasarca, dizziness, palpitations, syncope.   GI  No heartburn, indigestion, abdominal pain, nausea, vomiting, diarrhea, change in bowel habits, loss of appetite, bloody stools.   Resp:   No chest wall deformity  Skin: no rash or lesions.  GU: no dysuria, change in color of urine, no urgency or frequency.  No flank pain, no hematuria    MS:  No joint pain or swelling.  No decreased range of motion.  No back pain.  Psych:  No change in mood or affect. No depression or anxiety.  No memory loss.         Objective:   Physical Exam GEN: A/Ox3; pleasant , NAD, elderly obese  HEENT:  [place]/AT,  EACs-clear, TMs-wnl, NOSE-clear, THROAT-clear, no lesions, no postnasal drip or exudate noted.   NECK:  Supple w/ fair ROM; no JVD; normal carotid impulses w/o bruits; no thyromegaly or nodules palpated; no lymphadenopathy.  RESP  Decrased BS in bases  No accessory muscle use, no dullness to percussion  CARD:  RRR, no m/r/g  , no peripheral edema, pulses intact, no cyanosis or clubbing.  GI:   Soft & nt; nml bowel sounds; no organomegaly or masses detected.  Musco: Warm bil, no deformities or joint swelling noted.   Neuro: alert, no focal deficits noted.    Skin: Warm, no lesions or rashes    CXR 08/27/2015  COPD. No active disease.      Assessment & Plan:

## 2015-08-27 NOTE — Assessment & Plan Note (Signed)
Set up for follow up serial CT chest

## 2015-08-27 NOTE — Assessment & Plan Note (Signed)
Exacerbation with clinical improvement with augmentin   Plan  Finish Augmentin .  Restart Spiriva daily  Wear oxygen 2l/m with activity and At bedtime   Order sent for portable concentrator to [facility] Get Flu shot next week .  Set up for CT chest .  follow up Dr. Chase Caller in 3 months and As needed   Please contact office for sooner follow up if symptoms do not improve or worsen or seek emergency care

## 2015-08-27 NOTE — Assessment & Plan Note (Signed)
Recent new start on O2  Order for POC   Plan   Wear oxygen 2l/m with activity and At bedtime   Order sent for portable concentrator to [facility] follow up Dr. Chase Caller in 3 months and As needed   Please contact office for sooner follow up if symptoms do not improve or worsen or seek emergency care

## 2015-08-29 ENCOUNTER — Ambulatory Visit
Admission: RE | Admit: 2015-08-29 | Discharge: 2015-08-29 | Disposition: A | Discharge status: Home / Self Care | Payer: PPO | Source: Ambulatory Visit | Attending: Adult Health | Admitting: Adult Health

## 2015-08-29 DIAGNOSIS — C3492 Malignant neoplasm of unspecified part of left bronchus or lung: Secondary | ICD-10-CM

## 2015-08-30 ENCOUNTER — Other Ambulatory Visit: Payer: Self-pay | Admitting: Adult Health

## 2015-08-30 DIAGNOSIS — C3492 Malignant neoplasm of unspecified part of left bronchus or lung: Secondary | ICD-10-CM

## 2015-08-30 DIAGNOSIS — R911 Solitary pulmonary nodule: Secondary | ICD-10-CM

## 2015-08-30 NOTE — Progress Notes (Signed)
Quick Note:  Called and spoke with patient. Informed him or results and recs. Patient voiced understanding and had no further. Order was placed for PET. ______

## 2015-08-30 NOTE — Progress Notes (Signed)
Quick Note:  CT results faxed to PCP. Nothing further needed. ______

## 2015-09-08 ENCOUNTER — Encounter (HOSPITAL_COMMUNITY): Payer: Self-pay | Admitting: Emergency Medicine

## 2015-09-08 ENCOUNTER — Emergency Department (HOSPITAL_COMMUNITY): Payer: PPO

## 2015-09-08 ENCOUNTER — Emergency Department (HOSPITAL_COMMUNITY)
Admission: EM | Admit: 2015-09-08 | Discharge: 2015-09-08 | Disposition: A | Discharge status: Home / Self Care | Payer: PPO | Attending: Emergency Medicine | Admitting: Emergency Medicine

## 2015-09-08 DIAGNOSIS — M542 Cervicalgia: Secondary | ICD-10-CM | POA: Diagnosis not present

## 2015-09-08 DIAGNOSIS — R161 Splenomegaly, not elsewhere classified: Secondary | ICD-10-CM | POA: Diagnosis not present

## 2015-09-08 DIAGNOSIS — Z79899 Other long term (current) drug therapy: Secondary | ICD-10-CM | POA: Insufficient documentation

## 2015-09-08 DIAGNOSIS — J441 Chronic obstructive pulmonary disease with (acute) exacerbation: Secondary | ICD-10-CM | POA: Insufficient documentation

## 2015-09-08 DIAGNOSIS — M545 Low back pain: Secondary | ICD-10-CM | POA: Diagnosis not present

## 2015-09-08 DIAGNOSIS — R079 Chest pain, unspecified: Secondary | ICD-10-CM | POA: Diagnosis present

## 2015-09-08 DIAGNOSIS — Z85118 Personal history of other malignant neoplasm of bronchus and lung: Secondary | ICD-10-CM | POA: Diagnosis not present

## 2015-09-08 DIAGNOSIS — I10 Essential (primary) hypertension: Secondary | ICD-10-CM | POA: Insufficient documentation

## 2015-09-08 DIAGNOSIS — E039 Hypothyroidism, unspecified: Secondary | ICD-10-CM | POA: Diagnosis not present

## 2015-09-08 DIAGNOSIS — F329 Major depressive disorder, single episode, unspecified: Secondary | ICD-10-CM | POA: Diagnosis not present

## 2015-09-08 DIAGNOSIS — Z87891 Personal history of nicotine dependence: Secondary | ICD-10-CM | POA: Insufficient documentation

## 2015-09-08 DIAGNOSIS — E669 Obesity, unspecified: Secondary | ICD-10-CM | POA: Diagnosis not present

## 2015-09-08 LAB — I-STAT TROPONIN, ED: Troponin i, poc: 0.01 ng/mL (ref 0.00–0.08)

## 2015-09-08 LAB — I-STAT CHEM 8, ED
BUN: 13 mg/dL (ref 6–20)
CALCIUM ION: 1.12 mmol/L — AB (ref 1.13–1.30)
CHLORIDE: 95 mmol/L — AB (ref 101–111)
Creatinine, Ser: 0.7 mg/dL (ref 0.61–1.24)
GLUCOSE: 101 mg/dL — AB (ref 65–99)
HCT: 49 % (ref 39.0–52.0)
Hemoglobin: 16.7 g/dL (ref 13.0–17.0)
POTASSIUM: 5 mmol/L (ref 3.5–5.1)
Sodium: 134 mmol/L — ABNORMAL LOW (ref 135–145)
TCO2: 30 mmol/L (ref 0–100)

## 2015-09-08 LAB — CBC
HEMATOCRIT: 45.9 % (ref 39.0–52.0)
Hemoglobin: 14 g/dL (ref 13.0–17.0)
MCH: 27.4 pg (ref 26.0–34.0)
MCHC: 30.5 g/dL (ref 30.0–36.0)
MCV: 89.8 fL (ref 78.0–100.0)
PLATELETS: 253 10*3/uL (ref 150–400)
RBC: 5.11 MIL/uL (ref 4.22–5.81)
RDW: 13.6 % (ref 11.5–15.5)
WBC: 12.5 10*3/uL — ABNORMAL HIGH (ref 4.0–10.5)

## 2015-09-08 LAB — BASIC METABOLIC PANEL
Anion gap: 12 (ref 5–15)
BUN: 11 mg/dL (ref 6–20)
CHLORIDE: 96 mmol/L — AB (ref 101–111)
CO2: 28 mmol/L (ref 22–32)
CREATININE: 0.76 mg/dL (ref 0.61–1.24)
Calcium: 9.3 mg/dL (ref 8.9–10.3)
GFR calc Af Amer: >60 mL/min (ref >60)
GFR calc non Af Amer: >60 mL/min (ref >60)
GLUCOSE: 102 mg/dL — AB (ref 65–99)
POTASSIUM: 5.1 mmol/L (ref 3.5–5.1)
SODIUM: 136 mmol/L (ref 135–145)

## 2015-09-08 LAB — BRAIN NATRIURETIC PEPTIDE: B Natriuretic Peptide: 45 pg/mL (ref 0.0–100.0)

## 2015-09-08 MED ORDER — PREDNISONE 20 MG PO TABS
40.0000 mg | ORAL_TABLET | Freq: Once | ORAL | Status: AC
  Administered 2015-09-08: 40 mg via ORAL
  Filled 2015-09-08: qty 2

## 2015-09-08 MED ORDER — IOHEXOL 350 MG/ML SOLN
100.0000 mL | Freq: Once | INTRAVENOUS | Status: AC | PRN
  Administered 2015-09-08: 100 mL via INTRAVENOUS

## 2015-09-08 MED ORDER — KETOROLAC TROMETHAMINE 30 MG/ML IJ SOLN
15.0000 mg | Freq: Once | INTRAMUSCULAR | Status: AC
  Administered 2015-09-08: 15 mg via INTRAVENOUS
  Filled 2015-09-08: qty 1

## 2015-09-08 MED ORDER — MORPHINE SULFATE (PF) 2 MG/ML IV SOLN
2.0000 mg | Freq: Once | INTRAVENOUS | Status: AC
  Administered 2015-09-08: 2 mg via INTRAVENOUS
  Filled 2015-09-08: qty 1

## 2015-09-08 MED ORDER — IPRATROPIUM-ALBUTEROL 0.5-2.5 (3) MG/3ML IN SOLN
3.0000 mL | RESPIRATORY_TRACT | Status: AC
  Administered 2015-09-08 (×3): 3 mL via RESPIRATORY_TRACT
  Filled 2015-09-08: qty 3
  Filled 2015-09-08: qty 6

## 2015-09-08 MED ORDER — PREDNISONE 20 MG PO TABS: ORAL_TABLET | ORAL | Status: DC

## 2015-09-08 MED ORDER — ACETAMINOPHEN 500 MG PO TABS
1000.0000 mg | ORAL_TABLET | Freq: Once | ORAL | Status: AC
  Administered 2015-09-08: 1000 mg via ORAL
  Filled 2015-09-08: qty 2

## 2015-09-08 NOTE — ED Notes (Signed)
Pt c/o left sided chest pain, back pain, neck pain onset yesterday. Pt recently placed on home O2, history of lung cancer.

## 2015-09-08 NOTE — ED Notes (Signed)
Patient with concerns regarding dx.  He has been encouraged to return with any new concerns or worsening sx.  He is following up with Md as directed as well.  He states his pain is much better at this time

## 2015-09-08 NOTE — ED Provider Notes (Signed)
CSN: [id number]     Arrival date & time 09/08/15  3086 History   First MD Initiated Contact with Patient 09/08/15 0945     Chief Complaint  Patient presents with  . Chest Pain  . Neck Pain  . Back Pain     (Consider location/radiation/quality/duration/timing/severity/associated sxs/prior Treatment) Patient is a 64 y.o. male presenting with chest pain, neck pain, and back pain. The history is provided by the patient.  Chest Pain Pain location:  L chest Pain quality: sharp and shooting   Pain radiates to:  Does not radiate Pain radiates to the back: no   Pain severity:  Moderate Onset quality:  Sudden Duration:  2 days Timing:  Constant Progression:  Worsening Chronicity:  New Relieved by:  Certain positions Exacerbated by: lying flat. Ineffective treatments:  None tried Associated symptoms: back pain, cough and shortness of breath   Associated symptoms: no abdominal pain, no fever, no headache, no palpitations and not vomiting   Risk factors: high cholesterol, hypertension, male sex and smoking   Risk factors: no coronary artery disease, no diabetes mellitus and no prior DVT/PE   Neck Pain Associated symptoms: chest pain   Associated symptoms: no fever and no headaches   Back Pain Associated symptoms: chest pain   Associated symptoms: no abdominal pain, no fever and no headaches    64 yo M with a chief complaint of left-sided chest pain. This been going on since last night. Pain is worse when he lays back flat, otherwise patient doesn't know of any worsening factors. Pain is sharp and shooting. Patient has had increased cough increased sputum and change in sputum production over the past couple days. Denies fevers or chills. Denies injury. Patient has a history of lung cancer with wedge resection. Patient had no recurrence until a recent CT scan was concerning for increased size lymph nodes in the area of the resection. Patient has a pet scan ordered for next week.   Past  Medical History  Diagnosis Date  . Streptococcal pneumonia [facility]) August 2009  . Congestive heart failure ([place])   . Gastrointestinal obstruction (HCC)     Ileus  . Pulmonary hypertension ([place])   . Polycythemia   . Hypothyroid   . COPD (chronic obstructive pulmonary disease) ([place])   . Obesity   . Lung cancer ([place])   . Gout   . BPH (benign prostatic hyperplasia) 2011  . Depression   . Hypertension    Past Surgical History  Procedure Laterality Date  . Appendectomy    . Back surgery    . Lung cancer surgery  2011  . Cardioversion  11/27/2011    Procedure: CARDIOVERSION;  Surgeon: Thayer Headings, MD;  Location: [facility] ENDOSCOPY;  Service: Cardiovascular;  Laterality: N/A;   Family History  Problem Relation Age of Onset  . Heart disease Father   . Cancer Neg Hx   . Diabetes Neg Hx   . Stroke Neg Hx   . Hypertension Neg Hx   . Kidney disease Neg Hx    Social History  Substance Use Topics  . Smoking status: Former Smoker -- 3.00 packs/day for 43 years    Types: Cigarettes    Quit date: 10/22/2001  . Smokeless tobacco: Never Used  . Alcohol Use: No    Review of Systems  Constitutional: Negative for fever and chills.  HENT: Negative for congestion and facial swelling.   Eyes: Negative for discharge and visual disturbance.  Respiratory: Positive for cough and shortness of  breath.   Cardiovascular: Positive for chest pain. Negative for palpitations.  Gastrointestinal: Negative for vomiting, abdominal pain and diarrhea.  Musculoskeletal: Positive for back pain and neck pain. Negative for myalgias and arthralgias.  Skin: Negative for color change and rash.  Neurological: Negative for tremors, syncope and headaches.  Psychiatric/Behavioral: Negative for confusion and dysphoric mood.      Allergies  Review of patient's allergies indicates no known allergies.  Home Medications   Prior to Admission medications   Medication Sig Start Date End Date Taking? Authorizing Provider   amLODipine (NORVASC) 5 MG tablet Take 1 tablet by mouth Daily. 03/09/11   Historical Provider, MD  amoxicillin-clavulanate (AUGMENTIN) 875-125 MG tablet Take 1 tablet by mouth 2 (two) times daily. For 10 days    Historical Provider, MD  aspirin 325 MG tablet Take 325 mg by mouth daily.      Historical Provider, MD  atorvastatin (LIPITOR) 80 MG tablet TAKE 1 TABLET BY MOUTH EVERY DAY Patient not taking: Reported on 08/27/2015 11/20/12   Janith Lima, MD  Bioflavonoid Products (GRAPE SEED PO) Take by mouth 2 (two) times daily.    Historical Provider, MD  finasteride (PROSCAR) 5 MG tablet Take 5 mg by mouth daily.    Historical Provider, MD  HYDROcodone-acetaminophen (NORCO) 10-325 MG per tablet Take 1 tablet by mouth every 4 (four) hours as needed.  12/14/11   Historical Provider, MD  HYDROcodone-acetaminophen (VICODIN) 5-500 MG per tablet Take 1 tablet by mouth every 6 (six) hours as needed for pain (gout). Patient not taking: Reported on 08/27/2015 08/29/12   Brand Males, MD  lisinopril (PRINIVIL,ZESTRIL) 10 MG tablet Take 1 tablet (10 mg total) by mouth daily. 12/29/11 08/27/15  Thayer Headings, MD  omeprazole (PRILOSEC) 20 MG capsule Take 1 tablet by mouth Twice daily. 07/01/12   Historical Provider, MD  predniSONE (DELTASONE) 20 MG tablet 2 tabs po daily x 4 days 09/08/15   Deno Etienne, DO  sildenafil (VIAGRA) 100 MG tablet Take 1 tablet (100 mg total) by mouth as directed. Patient not taking: Reported on 08/27/2015 12/03/11   Janith Lima, MD  tiotropium (SPIRIVA) 18 MCG inhalation capsule Place 1 capsule (18 mcg total) into inhaler and inhale daily. 08/27/15   Tammy S Parrett, NP   BP 151/69 mmHg  Pulse 105  Temp(Src) 97.8 F (36.6 C) (Axillary)  Resp 23  Ht 6' (1.829 m)  Wt 228 lb (103.42 kg)  BMI 30.92 kg/m2  SpO2 92% Physical Exam  Constitutional: He is oriented to person, place, and time. He appears well-developed and well-nourished.  HENT:  Head: Normocephalic and atraumatic.   Eyes: EOM are normal. Pupils are equal, round, and reactive to light.  Neck: Normal range of motion. Neck supple. No JVD present.  Cardiovascular: Normal rate and regular rhythm.  Exam reveals no gallop and no friction rub.   No murmur heard. Pulmonary/Chest: No respiratory distress. He has no wheezes.  Abdominal: He exhibits no distension. There is no rebound and no guarding.  Musculoskeletal: Normal range of motion.  Neurological: He is alert and oriented to person, place, and time.  Skin: No rash noted. No pallor.  Psychiatric: He has a normal mood and affect. His behavior is normal.  Nursing note and vitals reviewed.   ED Course  Procedures (including critical care time) Labs Review Labs Reviewed  BASIC METABOLIC PANEL - Abnormal; Notable for the following:    Chloride 96 (*)    Glucose, Bld 102 (*)  All other components within normal limits  CBC - Abnormal; Notable for the following:    WBC 12.5 (*)    All other components within normal limits  I-STAT CHEM 8, ED - Abnormal; Notable for the following:    Sodium 134 (*)    Chloride 95 (*)    Glucose, Bld 101 (*)    Calcium, Ion 1.12 (*)    All other components within normal limits  BRAIN NATRIURETIC PEPTIDE  I-STAT TROPOININ, ED    Imaging Review Dg Chest 2 View  09/08/2015  CLINICAL DATA:  Mid lower LEFT chest pain since yesterday, history COPD, LEFT lung cancer, hypertension, COPD, CHF, former smoker EXAM: CHEST  2 VIEW COMPARISON:  08/27/2015; correlation interval CT chest 08/29/2015 FINDINGS: Stable heart size and pulmonary vascularity. Superior mediastinal enlargement consistent with superior mediastinal adenopathy as seen on prior CT. Transverse narrowing of trachea to residual transverse diameter of 8 mm at a point approximately 7.5 cm above the carina. Postsurgical changes of the LEFT upper lobe. Mild bibasilar atelectasis. Underlying emphysematous changes. No pleural effusion or pneumothorax. No acute osseous  findings. IMPRESSION: Changes of COPD with bibasilar atelectasis. Persistent significant superior mediastinal enlargement compatible with adenopathy with associated tracheal compression, trachea with residual transverse diameter of 8 mm. Electronically Signed   By: Lavonia Dana M.D.   On: 09/08/2015 11:19   Ct Angio Chest Pe W/cm &/or Wo Cm  09/08/2015  CLINICAL DATA:  Left-sided chest pain, history of significant mediastinal adenopathy. EXAM: CT ANGIOGRAPHY CHEST WITH CONTRAST TECHNIQUE: Multidetector CT imaging of the chest was performed using the standard protocol during bolus administration of intravenous contrast. Multiplanar CT image reconstructions and MIPs were obtained to evaluate the vascular anatomy. CONTRAST:  185m OMNIPAQUE IOHEXOL 350 MG/ML SOLN COMPARISON:  08/29/2015 FINDINGS: The lungs are well aerated bilaterally. No focal infiltrate or sizable effusion is seen. Mild right basilar atelectasis is noted. The previously seen nodular density in the right lower lobe is not as well appreciated on this exam due to the underlying atelectasis. Postsurgical changes are again noted in the left upper lobe. The thoracic inlet demonstrates evidence of bulky lymphadenopathy similar to that seen on the prior exam. Bulky lymphadenopathy is again seen in the mediastinum and hila similar to that noted on the prior exam. Narrowing of the trachea secondary to the lymphadenopathy is seen which has increased from the prior exam. The thoracic aorta and pulmonary artery are well visualized and show no evidence of pulmonary emboli or focal dissection. Considerable retrocrural lymphadenopathy is noted extending into the upper abdomen. Spleen is again enlarged and demonstrates some heterogeneity. This is similar to that seen on the prior exam. Bulky retroperitoneal lymphadenopathy is noted in the upper abdomen. Nonobstructing right renal calculus is seen. Right renal cyst is again seen.12 bony structures are within normal  limits. Review of the MIP images confirms the above findings. IMPRESSION: Persistent neck, hilar, mediastinal and retroperitoneal lymphadenopathy with associated splenomegaly suspicious for lymphoma. Mild right basilar atelectasis is noted No evidence of pulmonary emboli is seen. Some narrowing of the trachea is noted. Electronically Signed   By: MInez CatalinaM.D.   On: 09/08/2015 11:40   I have personally reviewed and evaluated these images and lab results as part of my medical decision-making.   EKG Interpretation   Date/Time:  Sunday September 08 2015 09:37:04 EST Ventricular Rate:  105 PR Interval:  160 QRS Duration: 94 QT Interval:  326 QTC Calculation: 430 R Axis:   81 Text Interpretation:  Sinus tachycardia Otherwise normal ECG No  significant change since last tracing Confirmed by Shriley Joffe MD, DANIEL  [phone]) on 09/08/2015 9:57:16 AM      MDM   Final diagnoses:  COPD exacerbation ([place])  Splenomegaly    64 yo M with a chief complaint of left-sided chest pain. Patient's history consistent with a COPD exacerbation however patient has remarkably clear lungs with only mild prolonged expiration. Concern for possible pulmonary embolus. Patient had a recent CT scan has no prior history of PE.  Patient feeling much better after 3 DuoNeb's and steroids. Suspect that this is a COPD exacerbation. PE scan negative for PE, however there are signs that are concerning for possible lymphoma. This was discussed with the patient and family they will follow-up with oncology as well as the pulmonologist. User inhaler every 4 hours while awake return for worsening.  12:16 PM:  I have discussed the diagnosis/risks/treatment options with the patient and family and believe the pt to be eligible for discharge home to follow-up with PCP/pulm/oncology. We also discussed returning to the ED immediately if new or worsening sx occur. We discussed the sx which are most concerning (e.g., sudden worsening sob, need  to use inhaler <4 hours) that necessitate immediate return. Medications administered to the patient during their visit and any new prescriptions provided to the patient are listed below.  Medications given during this visit Medications  ipratropium-albuterol (DUONEB) 0.5-2.5 (3) MG/3ML nebulizer solution 3 mL (3 mLs Nebulization Given 09/08/15 1115)  morphine 2 MG/ML injection 2 mg (2 mg Intravenous Given 09/08/15 1011)  predniSONE (DELTASONE) tablet 40 mg (40 mg Oral Given 09/08/15 1010)  iohexol (OMNIPAQUE) 350 MG/ML injection 100 mL (100 mLs Intravenous Contrast Given 09/08/15 1050)  ketorolac (TORADOL) 30 MG/ML injection 15 mg (15 mg Intravenous Given 09/08/15 1122)  morphine 2 MG/ML injection 2 mg (2 mg Intravenous Given 09/08/15 1125)  acetaminophen (TYLENOL) tablet 1,000 mg (1,000 mg Oral Given 09/08/15 1158)    New Prescriptions   PREDNISONE (DELTASONE) 20 MG TABLET    2 tabs po daily x 4 days    The patient appears reasonably screen and/or stabilized for discharge and I doubt any other medical condition or other [facility] requiring further screening, evaluation, or treatment in the ED at this time prior to discharge.    Deno Etienne, DO 09/08/15 1216

## 2015-09-08 NOTE — Discharge Instructions (Signed)
User inhaler every 4 hours while awake. Return to the ED for worsening shortness of breath or need to use the inhaler more often. Follow with oncology to further evaluate your persistent swelling of the lymph nodes as well as a swollen spleen. Follow with your pulmonologist. Chronic Obstructive Pulmonary Disease Exacerbation Chronic obstructive pulmonary disease (COPD) is a common lung problem. In COPD, the flow of air from the lungs is limited. COPD exacerbations are times that breathing gets worse and you need extra treatment. Without treatment they can be life threatening. If they happen often, your lungs can become more damaged. If your COPD gets worse, your doctor may treat you with:  Medicines.  Oxygen.  Different ways to clear your airway, such as using a mask. HOME CARE  Do not smoke.  Avoid tobacco smoke and other things that bother your lungs.  If given, take your antibiotic medicine as told. Finish the medicine even if you start to feel better.  Only take medicines as told by your doctor.  Drink enough fluids to keep your pee (urine) clear or pale yellow (unless your doctor has told you not to).  Use a cool mist machine (vaporizer).  If you use oxygen or a machine that turns liquid medicine into a mist (nebulizer), continue to use them as told.  Keep up with shots (vaccinations) as told by your doctor.  Exercise regularly.  Eat healthy foods.  Keep all doctor visits as told. GET HELP RIGHT AWAY IF:  You are very short of breath and it gets worse.  You have trouble talking.  You have bad chest pain.  You have blood in your spit (sputum).  You have a fever.  You keep throwing up (vomiting).  You feel weak, or you pass out (faint).  You feel confused.  You keep getting worse. MAKE SURE YOU:  Understand these instructions.  Will watch your condition.  Will get help right away if you are not doing well or get worse.   This information is not intended  to replace advice given to you by your health care provider. Make sure you discuss any questions you have with your health care provider.   Document Released: 08/27/2011 Document Revised: 09/28/2014 Document Reviewed: 05/12/2013 Elsevier Interactive Patient Education [employer].

## 2015-09-09 ENCOUNTER — Telehealth: Payer: Self-pay | Admitting: Adult Health

## 2015-09-09 NOTE — Telephone Encounter (Signed)
Patient and patient's wife notified of MR's recommendations.  Patient scheduled (per MR) on 09/12/15 at 1:30pm Patient aware of appointment. Nothing further needed. Closing encounter

## 2015-09-09 NOTE — Telephone Encounter (Signed)
Tell BEKIM WERNTZ and wife that I am glad Tammy ordered CT - shows new lymph glands since 2013 dec. The enlarged spleen fits in with this  Rec 1. YEs, I agree with TP that he needs PET scan 09/11/15 -I can call him with results 2. Post PET scan he can see me - talk to Daneil Dan and can give him first slot 09/12/15 - one patient canceled or see Jennings/Byrum - because likely needs biopsy 3. Hold off on Onc for now - we do not know what it is as yet

## 2015-09-09 NOTE — Telephone Encounter (Signed)
Called and spoke with pt wife - pt was seen in ED this past weekend and had a CT Angio scan 09/08/15 - states that they were advised that he has an enlarged spleen. Pt is scheduled for a PET scan 09/11/15. Pt was also referred to an Oncologist by ED MD Deno Etienne). Wife is requesting that [name] look at his CT from this weekend and give any rec's, also wanting to know about having PET scan this week so close to the recent CT Angio 09/08/15. Wife is also concerned with the referral that was placed to Oncology -- is this necessary at this point? Should she call and set up OV?  Aware that I will send to MR to advise.

## 2015-09-11 ENCOUNTER — Encounter (HOSPITAL_COMMUNITY): Payer: Self-pay

## 2015-09-11 ENCOUNTER — Ambulatory Visit (HOSPITAL_COMMUNITY)
Admission: RE | Admit: 2015-09-11 | Discharge: 2015-09-11 | Disposition: A | Discharge status: Home / Self Care | Payer: PPO | Source: Ambulatory Visit | Attending: Adult Health | Admitting: Adult Health

## 2015-09-11 ENCOUNTER — Telehealth: Payer: Self-pay | Admitting: Adult Health

## 2015-09-11 DIAGNOSIS — R911 Solitary pulmonary nodule: Secondary | ICD-10-CM

## 2015-09-11 DIAGNOSIS — R161 Splenomegaly, not elsewhere classified: Secondary | ICD-10-CM | POA: Diagnosis not present

## 2015-09-11 DIAGNOSIS — R591 Generalized enlarged lymph nodes: Secondary | ICD-10-CM | POA: Insufficient documentation

## 2015-09-11 DIAGNOSIS — D735 Infarction of spleen: Secondary | ICD-10-CM | POA: Diagnosis not present

## 2015-09-11 DIAGNOSIS — C3492 Malignant neoplasm of unspecified part of left bronchus or lung: Secondary | ICD-10-CM | POA: Diagnosis not present

## 2015-09-11 LAB — GLUCOSE, CAPILLARY: Glucose-Capillary: 106 mg/dL — ABNORMAL HIGH (ref 65–99)

## 2015-09-11 MED ORDER — FLUDEOXYGLUCOSE F - 18 (FDG) INJECTION
10.7500 | Freq: Once | INTRAVENOUS | Status: AC | PRN
  Administered 2015-09-11: 10.75 via INTRAVENOUS

## 2015-09-11 NOTE — Telephone Encounter (Signed)
Per TP Send message to MR to notify him that PET scan has been completed  MR please review PET Scan

## 2015-09-12 ENCOUNTER — Ambulatory Visit (INDEPENDENT_AMBULATORY_CARE_PROVIDER_SITE_OTHER): Payer: PPO | Admitting: Internal Medicine

## 2015-09-12 ENCOUNTER — Other Ambulatory Visit (INDEPENDENT_AMBULATORY_CARE_PROVIDER_SITE_OTHER): Payer: PPO

## 2015-09-12 ENCOUNTER — Encounter: Payer: Self-pay | Admitting: Internal Medicine

## 2015-09-12 ENCOUNTER — Telehealth: Payer: Self-pay | Admitting: Hematology and Oncology

## 2015-09-12 VITALS — BP 188/80 | HR 107 | Ht 72.0 in | Wt 216.0 lb

## 2015-09-12 DIAGNOSIS — R599 Enlarged lymph nodes, unspecified: Secondary | ICD-10-CM

## 2015-09-12 DIAGNOSIS — R590 Localized enlarged lymph nodes: Secondary | ICD-10-CM

## 2015-09-12 DIAGNOSIS — N5089 Other specified disorders of the male genital organs: Secondary | ICD-10-CM | POA: Insufficient documentation

## 2015-09-12 DIAGNOSIS — R591 Generalized enlarged lymph nodes: Secondary | ICD-10-CM

## 2015-09-12 HISTORY — DX: Other specified disorders of the male genital organs: N50.89

## 2015-09-12 HISTORY — DX: Generalized enlarged lymph nodes: R59.1

## 2015-09-12 HISTORY — DX: Localized enlarged lymph nodes: R59.0

## 2015-09-12 LAB — LACTATE DEHYDROGENASE: LDH: 173 U/L (ref 94–250)

## 2015-09-12 LAB — PROTIME-INR
INR: 1.1 ratio — ABNORMAL HIGH (ref 0.8–1.0)
Prothrombin Time: 11.6 s (ref 9.6–13.1)

## 2015-09-12 LAB — HCG, QUANTITATIVE, PREGNANCY: Quantitative HCG: 1.26 m[IU]/mL

## 2015-09-12 NOTE — Patient Instructions (Addendum)
ICD-9-CM ICD-10-CM   1. Lymphadenopathy syndrome 785.6 R59.9   2. Retroperitoneal lymphadenopathy 785.6 R59.0   3. Testicular swelling 608.86 N50.89    Do ldh, alfa feto proetin, ldh, Beta-HCG, INR blood work Do CT guided biopsy of retroperitoneal lymphadenopathy by Dr. Arne Cleveland of interventional radiology it at [facility] or [place]  Follow-up - 5 days to 7 days after the biopsy with either myself or [employer]

## 2015-09-12 NOTE — Telephone Encounter (Signed)
Called pt's spouse in ref to np appt. Pt  Has appt. With Dr. Chase Caller today and wants to wait to set up appt until they speak to the Dr.

## 2015-09-12 NOTE — Progress Notes (Signed)
Subjective:     Patient ID: [name], male   DOB: 10-13-1950, 64 y.o.   MRN: [id number]  HPI   OV 09/12/2015  Chief Complaint  Patient presents with  . Follow-up    Pt here after PET scan. Pt went to [facility] ED on 12.18.16 for COPD exacerbation. Pt states after the ED visit his breathing is doing well.    COPD and lung cancer survivor patient of mine. Not seen in 3 years. He now presents with his wife. They said that he they did get a follow-up phone call from [place] in 2014 but she did not check a voicemail until recently. He now reports that for the last few months he's been feeling unwell and also notices some pain in his left upper quadrant in the abdomen. A few weeks later approximately 2 months ago he started noticing testicle swelling. He was also having fatigue. He did not seek medical help because he is waiting till December to qualify for Medicare. He did see a urologist who reassured him that his testicular swelling was related to hydrocele. Apparently tumor markers for testicle cancer were checked but he never heard back and think s is normal. We called the urologist office and confirmed that they did not check for beta hCG, alpha-fetoprotein and LDH.  He subsequently followed up with my nurse practitioner who then ordered a CT scan chest which is being followed up by PET scan and therefore he is here to review his PET scan. In the interim he did see emergency department for COPD exacerbation was given short course of prednisone and is helping his symptoms. PET scan which I personally visualized shows extensive lymphadenopathy in his neck and me to several adenopathy associated with right paratracheal lymphadenopathy and splenomegaly with splenic infarct and abdominal lymphadenopathy very suspicious for lymphoma   D/w Dr Renold Don of  IR - retroperit nodes are very adccessible and quite safe.   Also notices desaturating with exertion   Review of Systems Per HPI    Objective:   Physical Exam Filed Vitals:   09/12/15 1332  BP: 188/80  Pulse: 107  Height: 6' (1.829 m)  Weight: 216 lb (97.977 kg)  SpO2: 94%   Discussion only visit       Assessment:       ICD-9-CM ICD-10-CM   1. Lymphadenopathy syndrome 785.6 R59.9   2. Retroperitoneal lymphadenopathy 785.6 R59.0   3. Testicular swelling 608.86 N50.89           Plan:      High concern for lymphoma with differential diagnosis of testicle cancer although no PET uptake of the testis  Do ldh, alfa feto proetin, ldh, Beta-HCG, INR blood work Do CT guided biopsy of retroperitoneal lymphadenopathy by Dr. Arne Cleveland of interventional radiology it at [facility] or [place]  Follow-up - 5 days to 7 days after the biopsy with either myself or Tammy Parrott   > 50% of this > 25 min visit spent in face to face counseling or coordination of care   Dr. Brand Males, M.D., [facility].C.P Pulmonary and Critical Care Medicine Staff Physician [facility] Pager: [phone], If no answer or between  15:00h - 7:00h: call [phone]  09/12/2015 2:05 PM

## 2015-09-17 ENCOUNTER — Other Ambulatory Visit: Payer: Self-pay | Admitting: Radiology

## 2015-09-17 LAB — ALPHA FETOPROTEIN, L3 PERCENT: AFP, Serum: 1.3 ng/mL (ref 0.0–8.0)

## 2015-09-18 ENCOUNTER — Ambulatory Visit (HOSPITAL_COMMUNITY)
Admission: RE | Admit: 2015-09-18 | Discharge: 2015-09-18 | Disposition: A | Discharge status: Home / Self Care | Payer: PPO | Source: Ambulatory Visit | Attending: Internal Medicine | Admitting: Internal Medicine

## 2015-09-18 ENCOUNTER — Telehealth: Payer: Self-pay | Admitting: Internal Medicine

## 2015-09-18 ENCOUNTER — Encounter (HOSPITAL_COMMUNITY): Payer: Self-pay

## 2015-09-18 DIAGNOSIS — R591 Generalized enlarged lymph nodes: Secondary | ICD-10-CM | POA: Insufficient documentation

## 2015-09-18 DIAGNOSIS — Z85118 Personal history of other malignant neoplasm of bronchus and lung: Secondary | ICD-10-CM | POA: Insufficient documentation

## 2015-09-18 DIAGNOSIS — R109 Unspecified abdominal pain: Secondary | ICD-10-CM | POA: Diagnosis not present

## 2015-09-18 DIAGNOSIS — J449 Chronic obstructive pulmonary disease, unspecified: Secondary | ICD-10-CM | POA: Insufficient documentation

## 2015-09-18 DIAGNOSIS — R590 Localized enlarged lymph nodes: Secondary | ICD-10-CM

## 2015-09-18 MED ORDER — HYDROCODONE-ACETAMINOPHEN 5-325 MG PO TABS
2.0000 | ORAL_TABLET | ORAL | Status: AC
  Administered 2015-09-18: 2 via ORAL
  Filled 2015-09-18: qty 2

## 2015-09-18 MED ORDER — FENTANYL CITRATE (PF) 100 MCG/2ML IJ SOLN
INTRAMUSCULAR | Status: AC | PRN
  Administered 2015-09-18: 50 ug via INTRAVENOUS

## 2015-09-18 MED ORDER — PREDNISONE 20 MG PO TABS: 20.0000 mg | ORAL_TABLET | Freq: Every day | ORAL | Status: DC

## 2015-09-18 MED ORDER — SODIUM CHLORIDE 0.9 % IV SOLN
INTRAVENOUS | Status: DC
  Administered 2015-09-18: 07:00:00 via INTRAVENOUS

## 2015-09-18 MED ORDER — MIDAZOLAM HCL 2 MG/2ML IJ SOLN
INTRAMUSCULAR | Status: AC | PRN
Start: 2015-09-18 — End: 2015-09-18
  Administered 2015-09-18 (×4): 1 mg via INTRAVENOUS

## 2015-09-18 MED ORDER — FENTANYL CITRATE (PF) 100 MCG/2ML IJ SOLN
INTRAMUSCULAR | Status: AC
  Filled 2015-09-18: qty 4

## 2015-09-18 MED ORDER — MIDAZOLAM HCL 2 MG/2ML IJ SOLN
INTRAMUSCULAR | Status: AC
  Filled 2015-09-18: qty 6

## 2015-09-18 NOTE — Telephone Encounter (Signed)
Spoke w/ spouse. Pt had Bx done today on lymph node. Pt has been off prednisone for few days. Since being off the prednisone he feels his lymph nodes are very swallow and has hard time swallowing. Per spouse, MR did not want to put pt on prednisone until after the biopsy so they could get a good sample. Spouse requesting prednisone for pt. Please advise Dr. Halford Chessman in MR's absence. thanks

## 2015-09-18 NOTE — Procedures (Signed)
L retroperitoneal LN Bx 18 g core times 4 No comp/EBL

## 2015-09-18 NOTE — Telephone Encounter (Signed)
Patient notified.  Patient does not have any more Prednisone, he was only given 8 tablets.   Do you want a full month supply sent to pharmacy? Please advise.

## 2015-09-18 NOTE — Telephone Encounter (Signed)
He can resume previous dose of prednisone.

## 2015-09-18 NOTE — Telephone Encounter (Signed)
Called and spoke with pt. Informed pt of VS's recs and verified pharmacy as [facility] in [place]. Pt voiced understanding and had no further questions. Rx sent to pharmacy. Nothing further needed.

## 2015-09-18 NOTE — H&P (Signed)
HPI: Patient with history of lung cancer. He c/o left flank pain and 20 lb weight loss over past 2 months and was seen by Dr. Chase Caller. Imaging revealed left retroperitoneal lymphadenopathy and he has been scheduled today for image guided left RP lymph node biopsy  The patient has had a H&P performed within the last 30 days, all history, medications, and exam have been reviewed. The patient denies any interval changes since the H&P.  Medications: Prior to Admission medications   Medication Sig Start Date End Date Taking? Authorizing Provider  amLODipine (NORVASC) 5 MG tablet Take 1 tablet by mouth Daily. 03/09/11  Yes Historical Provider, MD  aspirin 325 MG tablet Take 325 mg by mouth daily.     Yes Historical Provider, MD  finasteride (PROSCAR) 5 MG tablet Take 5 mg by mouth daily.   Yes Historical Provider, MD  HYDROcodone-acetaminophen (NORCO) 10-325 MG per tablet Take 1 tablet by mouth every 4 (four) hours as needed.  12/14/11  Yes Historical Provider, MD  lisinopril (PRINIVIL,ZESTRIL) 10 MG tablet Take 1 tablet (10 mg total) by mouth daily. 12/29/11 09/18/15 Yes Thayer Headings, MD  omeprazole (PRILOSEC) 20 MG capsule Take 1 tablet by mouth Twice daily. 07/01/12  Yes Historical Provider, MD  predniSONE (DELTASONE) 20 MG tablet 2 tabs po daily x 4 days 09/08/15  Yes Deno Etienne, DO  tiotropium (SPIRIVA) 18 MCG inhalation capsule Place 1 capsule (18 mcg total) into inhaler and inhale daily. 08/27/15  Yes Tammy S Parrett, NP  atorvastatin (LIPITOR) 80 MG tablet TAKE 1 TABLET BY MOUTH EVERY DAY Patient not taking: Reported on 08/27/2015 11/20/12   Janith Lima, MD  sildenafil (VIAGRA) 100 MG tablet Take 1 tablet (100 mg total) by mouth as directed. 12/03/11   Janith Lima, MD     Vital Signs: BP 145/85 mmHg  Pulse 91  Temp(Src) 98.1 F (36.7 C) (Oral)  Resp 18  SpO2 95%  Physical Exam  Constitutional: He is oriented to person, place, and time. No distress.  HENT:  Head: Normocephalic  and atraumatic.  Cardiovascular: Normal rate and regular rhythm.  Exam reveals no gallop and no friction rub.   No murmur heard. Pulmonary/Chest: Effort normal and breath sounds normal. No respiratory distress. He has no wheezes. He has no rales.  Abdominal: Soft. Bowel sounds are normal.  Neurological: He is alert and oriented to person, place, and time.  Skin: Skin is warm and dry. He is not diaphoretic.    Mallampati Score:  MD Evaluation Airway: WNL Heart: WNL Abdomen: WNL Chest/ Lungs: WNL ASA  Classification: 2 Mallampati/Airway Score: Two  Labs:  CBC:  Recent Labs  09/08/15 0957 09/08/15 1008  WBC 12.5*  --   HGB 14.0 16.7  HCT 45.9 49.0  PLT 253  --     COAGS:  Recent Labs  09/12/15 1415  INR 1.1*    BMP:  Recent Labs  09/08/15 0957 09/08/15 1008  NA 136 134*  K 5.1 5.0  CL 96* 95*  CO2 28  --   GLUCOSE 102* 101*  BUN 11 13  CALCIUM 9.3  --   CREATININE 0.76 0.70  GFRNONAA >60  --   GFRAA >60  --     Assessment/Plan:  History of lung cancer COPD wear 2L oxygen at night Weight loss 20 lbs in 2 months Left flank pain Imaging with left retroperitoneal lymphadenopathy  Seen by Dr. Chase Caller 09/12/15 Scheduled today for image guided left RP lymph node biopsy  with sedation The patient has been NPO, no blood thinners taken, labs and vitals have been reviewed. Risks and Benefits discussed with the patient including, but not limited to bleeding, infection, damage to adjacent structures or low yield requiring additional tests. All of the patient's questions were answered, patient is agreeable to proceed. Consent signed and in chart.   SignedHedy Jacob 09/18/2015, 8:58 AM

## 2015-09-18 NOTE — Discharge Instructions (Signed)
Needle Biopsy, Care After These instructions give you information about caring for yourself after your procedure. Your doctor may also give you more specific instructions. Call your doctor if you have any problems or questions after your procedure. HOME CARE  Rest as told by your doctor.  Take medicines only as told by your doctor.  There are many different ways to close and cover the biopsy site, including stitches (sutures), skin glue, and adhesive strips. Follow instructions from your doctor about:  How to take care of your biopsy site.  When and how you should change your bandage (dressing).  When you should remove your dressing.  Removing whatever was used to close your biopsy site.  Check your biopsy site every day for signs of infection. Watch for:  Redness, swelling, or pain.  Fluid, blood, or pus. GET HELP IF:  You have a fever.  You have redness, swelling, or pain at the biopsy site, and it lasts longer than a few days.  You have fluid, blood, or pus coming from the biopsy site.  You feel sick to your stomach (nauseous).  You throw up (vomit). GET HELP RIGHT AWAY IF:  You are short of breath.  You have trouble breathing.  Your chest hurts.  You feel dizzy or you pass out (faint).  You have bleeding that does not stop with pressure or a bandage.  You cough up blood.  Your belly (abdomen) hurts.   This information is not intended to replace advice given to you by your health care provider. Make sure you discuss any questions you have with your health care provider.   Document Released: 08/20/2008 Document Revised: 01/22/2015 Document Reviewed: 09/03/2014 Elsevier Interactive Patient Education 2016 Elsevier Inc. Moderate Conscious Sedation, Adult Sedation is the use of medicines to promote relaxation and relieve discomfort and anxiety. Moderate conscious sedation is a type of sedation. Under moderate conscious sedation you are less alert than normal but  are still able to respond to instructions or stimulation. Moderate conscious sedation is used during short medical and dental procedures. It is milder than deep sedation or general anesthesia and allows you to return to your regular activities sooner. LET [facility] CARE PROVIDER KNOW ABOUT:   Any allergies you have.  All medicines you are taking, including vitamins, herbs, eye drops, creams, and over-the-counter medicines.  Use of steroids (by mouth or creams).  Previous problems you or members of your family have had with the use of anesthetics.  Any blood disorders you have.  Previous surgeries you have had.  Medical conditions you have.  Possibility of pregnancy, if this applies.  Use of cigarettes, alcohol, or illegal drugs. RISKS AND COMPLICATIONS Generally, this is a safe procedure. However, as with any procedure, problems can occur. Possible problems include:  Oversedation.  Trouble breathing on your own. You may need to have a breathing tube until you are awake and breathing on your own.  Allergic reaction to any of the medicines used for the procedure. BEFORE THE PROCEDURE  You may have blood tests done. These tests can help show how well your kidneys and liver are working. They can also show how well your blood clots.  A physical exam will be done.  Only take medicines as directed by your health care provider. You may need to stop taking medicines (such as blood thinners, aspirin, or nonsteroidal anti-inflammatory drugs) before the procedure.   Do not eat or drink at least 6 hours before the procedure or as directed by  your health care provider.  Arrange for a responsible adult, family member, or friend to take you home after the procedure. He or she should stay with you for at least 24 hours after the procedure, until the medicine has worn off. PROCEDURE   An intravenous (IV) catheter will be inserted into one of your veins. Medicine will be able to flow  directly into your body through this catheter. You may be given medicine through this tube to help prevent pain and help you relax.  The medical or dental procedure will be done. AFTER THE PROCEDURE  You will stay in a recovery area until the medicine has worn off. Your blood pressure and pulse will be checked.   Depending on the procedure you had, you may be allowed to go home when you can tolerate liquids and your pain is under control.   This information is not intended to replace advice given to you by your health care provider. Make sure you discuss any questions you have with your health care provider.   Document Released: 06/02/2001 Document Revised: 09/28/2014 Document Reviewed: 05/15/2013 Elsevier Interactive Patient Education [employer].

## 2015-09-18 NOTE — Telephone Encounter (Signed)
Okay to send one month supply.

## 2015-09-19 ENCOUNTER — Telehealth: Payer: Self-pay | Admitting: Internal Medicine

## 2015-09-19 ENCOUNTER — Telehealth: Payer: Self-pay | Admitting: *Deleted

## 2015-09-19 DIAGNOSIS — C3492 Malignant neoplasm of unspecified part of left bronchus or lung: Secondary | ICD-10-CM

## 2015-09-19 MED ORDER — PREDNISONE 20 MG PO TABS: 20.0000 mg | ORAL_TABLET | Freq: Every day | ORAL | Status: DC

## 2015-09-19 NOTE — Telephone Encounter (Signed)
Called and spoke pt. Pt stated that his prednisone was never received at the pharmacy. I explained to the patient that it was printed instead of sent to the pharmacy. I apologized and resent his prednisone. The patient stated that he has already picked up prednisone and that nothing further was needed. Pt voiced understanding. Nothing further needed.

## 2015-09-19 NOTE — Telephone Encounter (Signed)
Oncology Nurse Navigator Documentation  Oncology Nurse Navigator Flowsheets 09/19/2015  Navigator Encounter Type Introductory phone call/Called patient and scheduled him to be seen with Dr. Julien Nordmann on 09/25/15 arrive at 2:00.  Patient verbalized understanding of appt time and place.   Patient Visit Type Initial  Treatment Phase Abnormal Scans  Interventions Coordination of Care  Coordination of Care MD Appointments  Time Spent with Patient 15

## 2015-09-24 DIAGNOSIS — N433 Hydrocele, unspecified: Secondary | ICD-10-CM | POA: Diagnosis not present

## 2015-09-24 DIAGNOSIS — G894 Chronic pain syndrome: Secondary | ICD-10-CM | POA: Diagnosis not present

## 2015-09-24 DIAGNOSIS — I10 Essential (primary) hypertension: Secondary | ICD-10-CM | POA: Diagnosis not present

## 2015-09-24 DIAGNOSIS — J449 Chronic obstructive pulmonary disease, unspecified: Secondary | ICD-10-CM | POA: Diagnosis not present

## 2015-09-25 ENCOUNTER — Encounter: Payer: Self-pay | Admitting: Internal Medicine

## 2015-09-25 ENCOUNTER — Ambulatory Visit (HOSPITAL_BASED_OUTPATIENT_CLINIC_OR_DEPARTMENT_OTHER): Payer: PPO | Admitting: Internal Medicine

## 2015-09-25 VITALS — BP 163/79 | HR 92 | Temp 98.4°F | Resp 18 | Ht 72.0 in | Wt 218.4 lb

## 2015-09-25 DIAGNOSIS — Z85118 Personal history of other malignant neoplasm of bronchus and lung: Secondary | ICD-10-CM | POA: Diagnosis not present

## 2015-09-25 DIAGNOSIS — Z87891 Personal history of nicotine dependence: Secondary | ICD-10-CM | POA: Diagnosis not present

## 2015-09-25 DIAGNOSIS — C8318 Mantle cell lymphoma, lymph nodes of multiple sites: Secondary | ICD-10-CM

## 2015-09-25 DIAGNOSIS — C8518 Unspecified B-cell lymphoma, lymph nodes of multiple sites: Secondary | ICD-10-CM

## 2015-09-25 DIAGNOSIS — C8590 Non-Hodgkin lymphoma, unspecified, unspecified site: Secondary | ICD-10-CM | POA: Insufficient documentation

## 2015-09-25 DIAGNOSIS — C8258 Diffuse follicle center lymphoma, lymph nodes of multiple sites: Secondary | ICD-10-CM

## 2015-09-25 HISTORY — DX: Non-Hodgkin lymphoma, unspecified, unspecified site: C85.90

## 2015-09-25 MED ORDER — PREDNISONE 50 MG PO TABS
ORAL_TABLET | ORAL | Status: DC
Start: 2015-09-25 — End: 2016-01-17

## 2015-09-25 MED ORDER — PROCHLORPERAZINE MALEATE 10 MG PO TABS: 10.0000 mg | ORAL_TABLET | Freq: Four times a day (QID) | ORAL | Status: DC | PRN

## 2015-09-25 MED ORDER — LIDOCAINE-PRILOCAINE 2.5-2.5 % EX CREA: 1.0000 "application " | TOPICAL_CREAM | CUTANEOUS | Status: DC | PRN

## 2015-09-25 MED ORDER — ALLOPURINOL 100 MG PO TABS: 100.0000 mg | ORAL_TABLET | Freq: Two times a day (BID) | ORAL | Status: DC

## 2015-09-25 NOTE — Progress Notes (Signed)
[place] Telephone:([phone]) [phone]   [phone]  CONSULT NOTE  REFERRING PHYSICIAN: Dr. Brand Males  REASON FOR CONSULTATION:  65 years old white male with questionable lymphoma  HPI [name] is a 65 y.o. male was past medical history significant for hypertension, benign prostatic hypertrophy, COPD, hypothyroid, polycythemia, depression, gout, pulmonary hypertension, congestive heart failure as well as history of stage IA non-small cell lung cancer, adenocarcinoma with squamous differentiation status post wedge resection of the left upper lobe under the care of Dr. [name] in December 2011. The patient has been on observation with routine follow-up visit with Dr. Chase Caller and imaging studies until 2013 showed no evidence for disease recurrence. Over the last few months the patient has been complaining of worsening shortness of breath as well as cough and difficulty swallowing. He did not have insurance and was waiting until December 2012 for evaluation after he received Medicare. He presented to the emergency Department complaining of worsening dyspnea and CT of the chest without contrast performed on 08/29/2015 showed bulky lymphadenopathy in the chest and upper abdomen along with splenomegaly. The findings are most likely due to lymphoma and metastatic lung cancer was felt to be unlikely. The patient was seen by Dr. Chase Caller and a PET scan was performed on 09/11/2015 and it showed extensive hypermetabolic adenopathy involving the neck, chest, abdomen and pelvis. The appearance is atypical for recurrent metastatic lung cancer and likely representing lymphoma. There was also extensive tumor involving the spleen with areas of splenic infarct.  On 09/18/2015 the patient underwent CT-guided core biopsy of the left retroperitoneal lymph node by interventional radiology. The final pathology (Accession: [id number]) showed high-grade B-cell lymphoma. Flow cytometry:  Monoclonal, lambda restricted B-cell population expressing pan B-cell antigens including CD20 ([id number]). Immunohistochemical stains: BCL-2, BCL-6, CD3, cytokeratin 8/18, CK7, CK20, CD10, CD20, CD43, LCA, CD79a, CD138 with appropriate controls. Dr. Chase Caller kindly referred the patient to me today for evaluation and recommendation regarding treatment of his condition. He was started recently on prednisone and he felt much better after starting this treatment. He denied having any significant chest pain but has mild shortness breath with mild dry cough with no hemoptysis. His dysphagia has improved after starting the treatment with steroids. The patient denied having any significant nausea or vomiting, no fever or chills. He denied having any significant headache or visual changes. He lost around 40 pounds in the last 4 months. He also has night sweats. Family history significant for a father who had heart disease and mother died from old age. The patient is married and has 1 daughter. He was accompanied by his wife [name]. He used to work as Pharmacologist. He has a history of smoking 3 pack per day for around 45 years and quit in 2003. He has no recent history of alcohol abuse and no history of drug abuse.  HPI  Past Medical History  Diagnosis Date  . Streptococcal pneumonia [facility]) August 2009  . Congestive heart failure ([place])   . Gastrointestinal obstruction (H[facility])     Ileus  . Pulmonary hypertension ([place])   . Polycythemia   . Hypothyroid   . COPD (chronic obstructive pulmonary disease) ([place])   . Obesity   . Gout   . BPH (benign prostatic hyperplasia) 2011  . Depression   . Hypertension   . Lung cancer ([place])   . NHL (non-Hodgkin's lymphoma) ([place]) 09/25/2015    Past Surgical History  Procedure Laterality Date  . Appendectomy    .  Back surgery    . Lung cancer surgery  2011  . Cardioversion  11/27/2011    Procedure: CARDIOVERSION;  Surgeon: Thayer Headings, MD;  Location: [facility] ENDOSCOPY;   Service: Cardiovascular;  Laterality: N/A;    Family History  Problem Relation Age of Onset  . Heart disease Father   . Cancer Neg Hx   . Diabetes Neg Hx   . Stroke Neg Hx   . Hypertension Neg Hx   . Kidney disease Neg Hx     Social History Social History  Substance Use Topics  . Smoking status: Former Smoker -- 3.00 packs/day for 43 years    Types: Cigarettes    Quit date: 10/22/2001  . Smokeless tobacco: Never Used  . Alcohol Use: No    No Known Allergies  Current Outpatient Prescriptions  Medication Sig Dispense Refill  . albuterol (PROVENTIL) (2.5 MG/3ML) 0.083% nebulizer solution INHALE 3 MILLILITERS (2.5 MG) BY NEBULIZATION ROUTE EVERY 8 HOURS AS NEEDED  3  . ALPRAZolam (XANAX) 0.5 MG tablet Take 0.5 mg by mouth 2 (two) times daily as needed for anxiety.    [name] amLODipine (NORVASC) 5 MG tablet Take 1 tablet by mouth Daily.    [name] aspirin 325 MG tablet Take 325 mg by mouth daily.      . betamethasone dipropionate 0.05 % lotion Apply 1 drop topically 2 (two) times daily.  3  . finasteride (PROSCAR) 5 MG tablet Take 5 mg by mouth daily.    [name] levothyroxine (SYNTHROID, LEVOTHROID) 25 MCG tablet Take 125 mcg by mouth daily.    [name] omeprazole (PRILOSEC) 20 MG capsule Take 1 tablet by mouth Twice daily.    [name] oxyCODONE-acetaminophen (PERCOCET) 10-325 MG tablet Take 1 tablet by mouth every 4 (four) hours as needed for pain.    . predniSONE (DELTASONE) 20 MG tablet Take 1 tablet (20 mg total) by mouth daily with breakfast. 30 tablet 0  . tiotropium (SPIRIVA) 18 MCG inhalation capsule Place 1 capsule (18 mcg total) into inhaler and inhale daily. 30 capsule 6  . allopurinol (ZYLOPRIM) 100 MG tablet Take 1 tablet (100 mg total) by mouth 2 (two) times daily. 60 tablet 4  . lidocaine-prilocaine (EMLA) cream Apply 1 application topically as needed. 30 g 0  . lisinopril (PRINIVIL,ZESTRIL) 10 MG tablet Take 1 tablet (10 mg total) by mouth daily. 30 tablet 11  . predniSONE (DELTASONE) 50 MG  tablet 2 tablets by mouth daily for 5 days starting with the first dose of chemotherapy every 3 weeks. 80 tablet 0  . prochlorperazine (COMPAZINE) 10 MG tablet Take 1 tablet (10 mg total) by mouth every 6 (six) hours as needed for nausea or vomiting. 60 tablet 0   No current facility-administered medications for this visit.    Review of Systems  Constitutional: positive for fatigue, night sweats and weight loss Eyes: negative Ears, nose, mouth, throat, and face: negative Respiratory: positive for cough and dyspnea on exertion Cardiovascular: negative Gastrointestinal: negative Genitourinary:negative Integument/breast: negative Hematologic/lymphatic: negative Musculoskeletal:negative Neurological: negative Behavioral/Psych: negative Endocrine: negative Allergic/Immunologic: negative  Physical Exam  TXM:IWOEH, healthy, no distress, well developed and malnourished SKIN: skin color, texture, turgor are normal, no rashes or significant lesions HEAD: Normocephalic, No masses, lesions, tenderness or abnormalities EYES: normal, PERRLA EARS: External ears normal, Canals clear OROPHARYNX:no exudate, no erythema and lips, buccal mucosa, and tongue normal  NECK: supple, no adenopathy, no JVD LYMPH:  no palpable lymphadenopathy, no hepatosplenomegaly LUNGS: expiratory wheezes bilaterally HEART: regular rate & rhythm, no  murmurs and no gallops ABDOMEN:abdomen soft, non-tender, normal bowel sounds and no masses or organomegaly BACK: Back symmetric, no curvature., No CVA tenderness EXTREMITIES:no joint deformities, effusion, or inflammation, no edema, no skin discoloration, no clubbing  NEURO: alert & oriented x 3 with fluent speech, no focal motor/sensory deficits  PERFORMANCE STATUS: ECOG 1  LABORATORY DATA: Lab Results  Component Value Date   WBC 12.5* 09/08/2015   HGB 16.7 09/08/2015   HCT 49.0 09/08/2015   MCV 89.8 09/08/2015   PLT 253 09/08/2015      Chemistry        Component Value Date/Time   NA 134* 09/08/2015 1008   K 5.0 09/08/2015 1008   CL 95* 09/08/2015 1008   CO2 28 09/08/2015 0957   BUN 13 09/08/2015 1008   CREATININE 0.70 09/08/2015 1008      Component Value Date/Time   CALCIUM 9.3 09/08/2015 0957   ALKPHOS 72 10/23/2010 0358   AST 14 10/23/2010 0358   ALT 17 10/23/2010 0358   BILITOT 0.4 10/23/2010 0358       RADIOGRAPHIC STUDIES: Dg Chest 2 View  09/08/2015  CLINICAL DATA:  Mid lower LEFT chest pain since yesterday, history COPD, LEFT lung cancer, hypertension, COPD, CHF, former smoker EXAM: CHEST  2 VIEW COMPARISON:  08/27/2015; correlation interval CT chest 08/29/2015 FINDINGS: Stable heart size and pulmonary vascularity. Superior mediastinal enlargement consistent with superior mediastinal adenopathy as seen on prior CT. Transverse narrowing of trachea to residual transverse diameter of 8 mm at a point approximately 7.5 cm above the carina. Postsurgical changes of the LEFT upper lobe. Mild bibasilar atelectasis. Underlying emphysematous changes. No pleural effusion or pneumothorax. No acute osseous findings. IMPRESSION: Changes of COPD with bibasilar atelectasis. Persistent significant superior mediastinal enlargement compatible with adenopathy with associated tracheal compression, trachea with residual transverse diameter of 8 mm. Electronically Signed   By: Lavonia Dana M.D.   On: 09/08/2015 11:19   Dg Chest 2 View  08/27/2015  CLINICAL DATA:  Cough, congestion for 2 weeks.  COPD. EXAM: CHEST  2 VIEW COMPARISON:  02/10/2012 FINDINGS: There is hyperinflation of the lungs compatible with COPD. Heart is borderline in size. No confluent airspace opacities or effusions. No acute bony abnormality. IMPRESSION: COPD.  No active disease. Electronically Signed   By: Rolm Baptise M.D.   On: 08/27/2015 10:37   Ct Chest Wo Contrast  08/29/2015  CLINICAL DATA:  Restaging lung cancer. History of left upper lobe lobectomy in 2011. Slight cough and  shortness of breath along with mild hypoxia. EXAM: CT CHEST WITHOUT CONTRAST TECHNIQUE: Multidetector CT imaging of the chest was performed following the standard protocol without IV contrast. COMPARISON:  08/30/2012. FINDINGS: Mediastinum/Nodes: No chest wall mass is identified. Supraclavicular and subclavicular adenopathy is noted bilaterally. Ate right-sided node on image 6 measures 15.5 mm and a left-sided node on image number 7 measures 17 mm. No axillary lymphadenopathy. The heart is normal in size for age. No pericardial effusion. The aorta demonstrates scattered atherosclerotic calcifications but no aneurysm. Coronary artery calcifications are noted. The pulmonary arteries are enlarged suggesting pulmonary hypertension. Diffuse and marked mediastinal and hilar lymphadenopathy. Index right paratracheal node measures 30 x 25 mm on image number 12. Prevascular lymph node on image number 26 measures 33 x 35 mm. Subcarinal lymph node on image number 36 measures 38.5 x 33 mm. Right retrocrural lymph node on image 55 measures 26 x 23 mm. The esophagus is grossly normal. Lungs/Pleura: Stable surgical changes involving the left upper  lobe. No findings for recurrent tumor. Stable right upper lobe scarring changes. No worrisome pulmonary lesions. No pleural effusion. There is dependent bibasilar atelectasis and right diaphragmatic calcification. Upper abdomen: Bulky upper abdominal lymphadenopathy is demonstrated. Upper abdominal nodal mass measures approximately 10 x 7 cm on image number 73. The spleen is enlarged and there is a splenic infarct noted. Simple appearing right renal cysts. No definite hepatic lesions. The pancreas is grossly normal. Musculoskeletal: No significant bony findings. IMPRESSION: 1. Bulky lymphadenopathy in the chest and upper abdomen along with splenomegaly. Findings are most likely due to lymphoma. Metastatic lung cancer is felt to be very unlikely. Recommend CT abdomen/pelvis with contrast  for further evaluation. There may be inguinal nodes that could be biopsied. If not, the supraclavicular nodes may be amenable to ultrasound-guided biopsy. 2. Stable scarring changes involving the lungs along with bibasilar atelectasis. I do not see any worrisome pulmonary lesions. 3. A small splenic infarct is noted. Electronically Signed   By: Marijo Sanes M.D.   On: 08/29/2015 13:58   Ct Angio Chest Pe W/cm &/or Wo Cm  09/08/2015  CLINICAL DATA:  Left-sided chest pain, history of significant mediastinal adenopathy. EXAM: CT ANGIOGRAPHY CHEST WITH CONTRAST TECHNIQUE: Multidetector CT imaging of the chest was performed using the standard protocol during bolus administration of intravenous contrast. Multiplanar CT image reconstructions and MIPs were obtained to evaluate the vascular anatomy. CONTRAST:  169m OMNIPAQUE IOHEXOL 350 MG/ML SOLN COMPARISON:  08/29/2015 FINDINGS: The lungs are well aerated bilaterally. No focal infiltrate or sizable effusion is seen. Mild right basilar atelectasis is noted. The previously seen nodular density in the right lower lobe is not as well appreciated on this exam due to the underlying atelectasis. Postsurgical changes are again noted in the left upper lobe. The thoracic inlet demonstrates evidence of bulky lymphadenopathy similar to that seen on the prior exam. Bulky lymphadenopathy is again seen in the mediastinum and hila similar to that noted on the prior exam. Narrowing of the trachea secondary to the lymphadenopathy is seen which has increased from the prior exam. The thoracic aorta and pulmonary artery are well visualized and show no evidence of pulmonary emboli or focal dissection. Considerable retrocrural lymphadenopathy is noted extending into the upper abdomen. Spleen is again enlarged and demonstrates some heterogeneity. This is similar to that seen on the prior exam. Bulky retroperitoneal lymphadenopathy is noted in the upper abdomen. Nonobstructing right renal  calculus is seen. Right renal cyst is again seen.12 bony structures are within normal limits. Review of the MIP images confirms the above findings. IMPRESSION: Persistent neck, hilar, mediastinal and retroperitoneal lymphadenopathy with associated splenomegaly suspicious for lymphoma. Mild right basilar atelectasis is noted No evidence of pulmonary emboli is seen. Some narrowing of the trachea is noted. Electronically Signed   By: MInez CatalinaM.D.   On: 09/08/2015 11:40   Nm Pet Image Initial (pi) Skull Base To Thigh  09/11/2015  CLINICAL DATA:  Subsequent treatment strategy for Lung cancer. EXAM: NUCLEAR MEDICINE PET SKULL BASE TO THIGH TECHNIQUE: 10.7 mCi F-18 FDG was injected intravenously. Full-ring PET imaging was performed from the skull base to thigh after the radiotracer. CT data was obtained and used for attenuation correction and anatomic localization. FASTING BLOOD GLUCOSE:  Value: 106 mg/dl COMPARISON:  07/11/2010 FINDINGS: NECK Hypermetabolic bilateral level 3 and level 4 lymph nodes are identified. Index lymph node on the right measures 9 mm and has an SUV max equal to 13.59, image 40 of series 4. On the  left level 4 node measures 1.5 cm and has an SUV max equal to 17.96. CHEST Extensive bilateral mediastinal adenopathy. Index right paratracheal node measures 2.7 cm and has an SUV max equal to 22.9, image 67 of series 4. Left-sided pre-vascular node measures 2.7 cm and has an SUV max equal to 22.76, image 67 of series 4. Hypermetabolic sub- carinal lymph node measures 2.5 cm and has an SUV max equal to 16.7, image 81 of series 4. Right hilar lymph node is enlarged and hypermetabolic measuring 3.1 cm, image 77 of series 4. This has an SUV max equal to 18.15. Hypermetabolic posterior mediastinal and retrocrural lymph nodes are identified. Lymph node to the right of the descending thoracic aorta measures 2 cm and has an SUV max equal to 28.3. The left-sided retrocrural lymph node measures 1.8 cm and  has an SUV max equal to 18.8, image 111 of series 4. Mild pleural thickening is identified overlying the lung bases posteriorly. No focal pulmonary lesion identified. ABDOMEN/PELVIS No focal liver abnormality identified. The gallbladder is normal. No biliary dilatation. Unremarkable appearance of the pancreas. The spleen measures 11 cm in length. Multiple hypermetabolic masses are identified within the splenic parenchyma. The largest appears to be centered around the splenic hilum. This measures approximately 8.6 cm and has an SUV max equal to 21.07. Large area of low attenuation along the inferior aspect of the spleen is favored to represent either necrotic tumor or splenic infarct. Bulky hypermetabolic adenopathy is identified within the upper abdomen. Large periaortic lymph node measures 3.8 cm and has an SUV max equal to 24.25, image 125 of series 4. Retrocaval lymph node measures 3 cm and has an SUV max equal to 17.8, image 131 of series 4. Hypermetabolic left common iliac node measures 1.2 cm and has an SUV max equal to 7.8. SKELETON There is heterogeneous tracer uptake identified throughout the visualized axial and appendicular skeleton. No focal areas of intense uptake to suggest bone involvement. IMPRESSION: 1. Examination is positive for extensive hypermetabolic adenopathy involving the neck, chest, abdomen and pelvis. The appearance of today's scan seems somewhat atypical for recurrent metastatic lung cancer and would be more fitting with lymphoma. Correlation with tissue sampling is recommended. 2. Extensive tumor involving the spleen is identified with a areas of splenic infarct. Electronically Signed   By: Kerby Moors M.D.   On: 09/11/2015 11:32   Ct Biopsy  09/18/2015  CLINICAL DATA:  Widespread the lymphadenopathy EXAM: CT-GUIDED BIOPSY LEFT RETROPERITONEAL LYMPH NODE.  CORE. MEDICATIONS AND MEDICAL HISTORY: Versed 4 mg, Fentanyl 50 mcg. Additional Medications: None. ANESTHESIA/SEDATION:  Moderate sedation time: 12 minutes PROCEDURE: The procedure, risks, benefits, and alternatives were explained to the patient. Questions regarding the procedure were encouraged and answered. The patient understands and consents to the procedure. The back in the right decubitus position was prepped with Betadine in a sterile fashion, and a sterile drape was applied covering the operative field. A sterile gown and sterile gloves were used for the procedure. Under CT guidance, a(n) 17 gauge guide needle was advanced into the left retroperitoneal lymph node. Subsequently four 18 gauge core biopsies were obtained. The guide needle was removed. Final imaging was performed. Patient tolerated the procedure well without complication. Vital sign monitoring by nursing staff during the procedure will continue as patient is in the special procedures unit for post procedure observation. FINDINGS: The images document guide needle placement within the left retroperitoneal lymph node. Post biopsy images demonstrate no hemorrhage. COMPLICATIONS: None IMPRESSION: Successful  CT-guided core biopsy of a left retroperitoneal lymph node. Electronically Signed   By: Marybelle Killings M.D.   On: 09/18/2015 12:13    ASSESSMENT: This is a very pleasant 65 years old white male recently diagnosed with at least stage III large B-cell non-Hodgkin lymphoma presenting with extensive lymphadenopathy involving the neck, chest, abdomen as well as splenomegaly diagnosed in December 2016.   PLAN: I had a lengthy discussion with the patient and his wife today about his current disease stage, prognosis and treatment options. I will complete the staging workup by ordering a bone marrow biopsy and aspirate to rule out involvement of the bone marrow with the non-Hodgkin lymphoma.. I discussed with the patient his treatment options including systemic chemotherapy with the CHOP/Rituxan every 3 weeks with Neulasta support. I discussed with the patient the  adverse effect of this treatment including but not limited to alopecia, myelosuppression, nausea and vomiting, peripheral neuropathy, liver, renal or cardiac dysfunction as well as infusion reaction with Rituxan. I will arrange for the patient to have 2-D echo for evaluation of his ejection fraction before starting the first dose of his treatment. I will also refer the patient to interventional radiology for consideration of bone marrow biopsy and aspirate as well as Port-A-Cath placement for IV access. I will start the patient on treatment with allopurinol 100 mg by mouth twice a day for tumor lysis prophylaxis. I will also check hepatitis panel before the first dose of his treatment. I will arrange for the patient to have a chemotherapy education class before the first dose of his treatment. I will call his pharmacy with prescription for Compazine 10 mg by mouth every 6 hours as needed for nausea, prednisone 100 mg by mouth daily for 5 days every 3 weeks is starting with the first day of every chemotherapy cycle in addition to Emla cream to be applied to the Port-A-Cath site before treatment. The patient is expected to start the first dose of his treatment next week. He would come back for follow-up visit in 2 weeks for reevaluation and management of any adverse effect of his treatment. He was advised to call immediately if he has any concerning symptoms in the interval. I The patient his wife the time to ask questions and I answered them completely to their satisfaction. The patient voices understanding of current disease status and treatment options and is in agreement with the current care plan.  All questions were answered. The patient knows to call the clinic with any problems, questions or concerns. We can certainly see the patient much sooner if necessary.  Thank you so much for allowing me to participate in the care of [name]. I will continue to follow up the patient with you and  assist in his care.  I spent 55 minutes counseling the patient face to face. The total time spent in the appointment was 80 minutes.  Disclaimer: This note was dictated with voice recognition software. Similar sounding words can inadvertently be transcribed and may not be corrected upon review.   Kiam Bransfield K. September 25, 2015, 3:54 PM

## 2015-09-26 DIAGNOSIS — N430 Encysted hydrocele: Secondary | ICD-10-CM | POA: Diagnosis not present

## 2015-09-26 DIAGNOSIS — J449 Chronic obstructive pulmonary disease, unspecified: Secondary | ICD-10-CM | POA: Diagnosis not present

## 2015-09-26 DIAGNOSIS — N3020 Other chronic cystitis without hematuria: Secondary | ICD-10-CM | POA: Diagnosis not present

## 2015-09-26 DIAGNOSIS — D294 Benign neoplasm of scrotum: Secondary | ICD-10-CM | POA: Diagnosis not present

## 2015-09-27 ENCOUNTER — Telehealth: Payer: Self-pay | Admitting: *Deleted

## 2015-09-27 ENCOUNTER — Other Ambulatory Visit: Payer: Self-pay | Admitting: Radiology

## 2015-09-27 ENCOUNTER — Telehealth: Payer: Self-pay | Admitting: Internal Medicine

## 2015-09-27 NOTE — Telephone Encounter (Signed)
Per staff message and POF I have scheduled appts. Advised scheduler of appts. JMW  

## 2015-09-27 NOTE — Telephone Encounter (Signed)
Appointments complete per 1/5 pof. Spoke with patient re next few appointments for [facility] 1/9 @ 12 pm after bx at [facility] - echo 1/9 at 3 pm at [facility] 1/10. Patient will get new schedule 1/9 at [facility] - port placement already on schedule for 1/12 - MM made aware in 1/5 email. Per Felipa Evener for echo #[id number].

## 2015-09-30 ENCOUNTER — Ambulatory Visit (HOSPITAL_BASED_OUTPATIENT_CLINIC_OR_DEPARTMENT_OTHER)
Admission: RE | Admit: 2015-09-30 | Discharge: 2015-09-30 | Disposition: A | Discharge status: Home / Self Care | Payer: PPO | Source: Ambulatory Visit | Attending: Specialist | Admitting: Specialist

## 2015-09-30 ENCOUNTER — Ambulatory Visit: Payer: PPO

## 2015-09-30 ENCOUNTER — Telehealth: Payer: Self-pay | Admitting: *Deleted

## 2015-09-30 ENCOUNTER — Ambulatory Visit (HOSPITAL_COMMUNITY)
Admission: RE | Admit: 2015-09-30 | Discharge: 2015-09-30 | Disposition: A | Discharge status: Home / Self Care | Payer: PPO | Source: Ambulatory Visit | Attending: Internal Medicine | Admitting: Internal Medicine

## 2015-09-30 ENCOUNTER — Other Ambulatory Visit (HOSPITAL_BASED_OUTPATIENT_CLINIC_OR_DEPARTMENT_OTHER): Payer: PPO

## 2015-09-30 ENCOUNTER — Telehealth: Payer: Self-pay | Admitting: Internal Medicine

## 2015-09-30 ENCOUNTER — Encounter (HOSPITAL_COMMUNITY): Payer: Self-pay

## 2015-09-30 ENCOUNTER — Other Ambulatory Visit: Payer: Self-pay | Admitting: Internal Medicine

## 2015-09-30 DIAGNOSIS — C8258 Diffuse follicle center lymphoma, lymph nodes of multiple sites: Secondary | ICD-10-CM

## 2015-09-30 DIAGNOSIS — I110 Hypertensive heart disease with heart failure: Secondary | ICD-10-CM | POA: Diagnosis not present

## 2015-09-30 DIAGNOSIS — Z79899 Other long term (current) drug therapy: Secondary | ICD-10-CM | POA: Insufficient documentation

## 2015-09-30 DIAGNOSIS — F329 Major depressive disorder, single episode, unspecified: Secondary | ICD-10-CM | POA: Diagnosis not present

## 2015-09-30 DIAGNOSIS — Z7982 Long term (current) use of aspirin: Secondary | ICD-10-CM | POA: Diagnosis not present

## 2015-09-30 DIAGNOSIS — Z85118 Personal history of other malignant neoplasm of bronchus and lung: Secondary | ICD-10-CM | POA: Diagnosis not present

## 2015-09-30 DIAGNOSIS — C3492 Malignant neoplasm of unspecified part of left bronchus or lung: Secondary | ICD-10-CM | POA: Diagnosis not present

## 2015-09-30 DIAGNOSIS — I272 Other secondary pulmonary hypertension: Secondary | ICD-10-CM | POA: Diagnosis not present

## 2015-09-30 DIAGNOSIS — N400 Enlarged prostate without lower urinary tract symptoms: Secondary | ICD-10-CM | POA: Insufficient documentation

## 2015-09-30 DIAGNOSIS — I509 Heart failure, unspecified: Secondary | ICD-10-CM | POA: Insufficient documentation

## 2015-09-30 DIAGNOSIS — C8318 Mantle cell lymphoma, lymph nodes of multiple sites: Secondary | ICD-10-CM

## 2015-09-30 DIAGNOSIS — D7589 Other specified diseases of blood and blood-forming organs: Secondary | ICD-10-CM | POA: Diagnosis not present

## 2015-09-30 DIAGNOSIS — M109 Gout, unspecified: Secondary | ICD-10-CM | POA: Diagnosis not present

## 2015-09-30 DIAGNOSIS — E039 Hypothyroidism, unspecified: Secondary | ICD-10-CM | POA: Insufficient documentation

## 2015-09-30 DIAGNOSIS — J449 Chronic obstructive pulmonary disease, unspecified: Secondary | ICD-10-CM | POA: Diagnosis not present

## 2015-09-30 DIAGNOSIS — C8590 Non-Hodgkin lymphoma, unspecified, unspecified site: Secondary | ICD-10-CM | POA: Diagnosis not present

## 2015-09-30 LAB — COMPREHENSIVE METABOLIC PANEL
ALK PHOS: 79 U/L (ref 40–150)
ALT: 9 U/L (ref 0–55)
ANION GAP: 8 meq/L (ref 3–11)
AST: 12 U/L (ref 5–34)
Albumin: 3.3 g/dL — ABNORMAL LOW (ref 3.5–5.0)
BUN: 16.3 mg/dL (ref 7.0–26.0)
CALCIUM: 9 mg/dL (ref 8.4–10.4)
CO2: 32 mEq/L — ABNORMAL HIGH (ref 22–29)
CREATININE: 0.9 mg/dL (ref 0.7–1.3)
Chloride: 104 mEq/L (ref 98–109)
EGFR: >90 mL/min/{1.73_m2} (ref >90)
Glucose: 136 mg/dl (ref 70–140)
Potassium: 3.9 mEq/L (ref 3.5–5.1)
Sodium: 143 mEq/L (ref 136–145)
TOTAL PROTEIN: 6.1 g/dL — AB (ref 6.4–8.3)

## 2015-09-30 LAB — CBC WITH DIFFERENTIAL/PLATELET
BASO%: 0.8 % (ref 0.0–2.0)
Basophils Absolute: 0 10*3/uL (ref 0.0–0.1)
Basophils Absolute: 0.1 10*3/uL (ref 0.0–0.1)
Basophils Relative: 0 %
EOS ABS: 0.2 10*3/uL (ref 0.0–0.7)
EOS%: 2.6 % (ref 0.0–7.0)
Eosinophils Absolute: 0.2 10*3/uL (ref 0.0–0.5)
Eosinophils Relative: 2 %
HEMATOCRIT: 46.3 % (ref 39.0–52.0)
HEMATOCRIT: 43.6 % (ref 38.4–49.9)
HEMOGLOBIN: 13.7 g/dL (ref 13.0–17.0)
HGB: 13.3 g/dL (ref 13.0–17.1)
LYMPH#: 0.7 10*3/uL — AB (ref 0.9–3.3)
LYMPH%: 7.5 % — ABNORMAL LOW (ref 14.0–49.0)
LYMPHS ABS: 0.7 10*3/uL (ref 0.7–4.0)
LYMPHS PCT: 7 %
MCH: 27.2 pg (ref 26.0–34.0)
MCH: 26.5 pg — ABNORMAL LOW (ref 27.2–33.4)
MCHC: 29.6 g/dL — ABNORMAL LOW (ref 30.0–36.0)
MCHC: 30.5 g/dL — AB (ref 32.0–36.0)
MCV: 92 fL (ref 78.0–100.0)
MCV: 86.8 fL (ref 79.3–98.0)
MONO#: 0.6 10*3/uL (ref 0.1–0.9)
MONO%: 7.2 % (ref 0.0–14.0)
Monocytes Absolute: 0.8 10*3/uL (ref 0.1–1.0)
Monocytes Relative: 8 %
NEUT%: 81.9 % — AB (ref 39.0–75.0)
NEUTROS ABS: 8.5 10*3/uL — AB (ref 1.7–7.7)
NEUTROS ABS: 7.3 10*3/uL — AB (ref 1.5–6.5)
NEUTROS PCT: 83 %
PLATELETS: 174 10*3/uL (ref 140–400)
Platelets: 176 10*3/uL (ref 150–400)
RBC: 5.03 MIL/uL (ref 4.22–5.81)
RBC: 5.03 10*6/uL (ref 4.20–5.82)
RDW: 14.6 % (ref 11.5–15.5)
RDW: 14.9 % — ABNORMAL HIGH (ref 11.0–14.6)
WBC: 10.3 10*3/uL (ref 4.0–10.5)
WBC: 8.9 10*3/uL (ref 4.0–10.3)

## 2015-09-30 LAB — TECHNOLOGIST REVIEW

## 2015-09-30 LAB — BONE MARROW EXAM

## 2015-09-30 MED ORDER — FENTANYL CITRATE (PF) 100 MCG/2ML IJ SOLN
INTRAMUSCULAR | Status: AC
  Filled 2015-09-30: qty 4

## 2015-09-30 MED ORDER — CLOTRIMAZOLE 10 MG MT LOZG: 10.0000 mg | LOZENGE | Freq: Every day | OROMUCOSAL | Status: DC

## 2015-09-30 MED ORDER — MIDAZOLAM HCL 2 MG/2ML IJ SOLN
INTRAMUSCULAR | Status: AC | PRN
  Administered 2015-09-30 (×3): 1 mg via INTRAVENOUS

## 2015-09-30 MED ORDER — MAGIC MOUTHWASH W/LIDOCAINE
5.0000 mL | Freq: Three times a day (TID) | ORAL | Status: DC
Start: 2015-09-30 — End: 2015-10-08

## 2015-09-30 MED ORDER — SODIUM CHLORIDE 0.9 % IV SOLN
INTRAVENOUS | Status: DC
Start: 2015-09-30 — End: 2015-10-01
  Administered 2015-09-30: 07:00:00 via INTRAVENOUS

## 2015-09-30 MED ORDER — FENTANYL CITRATE (PF) 100 MCG/2ML IJ SOLN
INTRAMUSCULAR | Status: AC | PRN
  Administered 2015-09-30: 50 ug via INTRAVENOUS

## 2015-09-30 MED ORDER — MIDAZOLAM HCL 2 MG/2ML IJ SOLN
INTRAMUSCULAR | Status: AC
  Filled 2015-09-30: qty 6

## 2015-09-30 NOTE — Telephone Encounter (Signed)
PER 1/9 POF R/S 1/10 LAB TO 1/9 AFTER [facility]. PATIENT HAVING PROCEDURE AT [facility]. LEFT WITH [facility] NURSE TO SEND PATIENT TO LAB AFTER [facility].

## 2015-09-30 NOTE — Procedures (Signed)
Bone marrow Bx R iliac No comp/EBL

## 2015-09-30 NOTE — Telephone Encounter (Signed)
Call from [facility] PharmD, pt will need labs 1/9 after chemo  Class. POF sent

## 2015-09-30 NOTE — Progress Notes (Signed)
Patient ID: [name], male   DOB: 1951-01-26, 65 y.o.   MRN: [id number]    Referring Physician(s): Mohamed,Mohamed  Chief Complaint:  "I'm here for a bone marrow biopsy"  Subjective:  Pt familiar to IR service from recent left RP lymph node biopsy on 09/18/15. He has a past medical history significant for hypertension, benign prostatic hypertrophy, COPD, hypothyroid, polycythemia, depression, gout, pulmonary hypertension, congestive heart failure as well as history of stage IA non-small cell lung cancer,adenocarcinoma with squamous differentiation status post wedge resection of the left upper lobe under the care of Dr. [name] in December 2011. He has been recently diagnosed with NHL and presents today for CT guided bone marrow biopsy for further evaluation. He denies recent fevers, chills, HA, CP, dyspnea, back pain,N/V or bleeding. He does have occ cough and left sided abd discomfort.   Allergies: Review of patient's allergies indicates no known allergies.  Medications: Prior to Admission medications   Medication Sig Start Date End Date Taking? Authorizing Provider  albuterol (PROVENTIL) (2.5 MG/3ML) 0.083% nebulizer solution INHALE 3 MILLILITERS (2.5 MG) BY NEBULIZATION ROUTE EVERY 8 HOURS AS NEEDED 08/30/15  Yes Historical Provider, MD  ALPRAZolam Duanne Moron) 0.5 MG tablet Take 0.5 mg by mouth 2 (two) times daily as needed for anxiety.   Yes Oneita Hurt, MD  amLODipine (NORVASC) 5 MG tablet Take 1 tablet by mouth Daily. 03/09/11  Yes Historical Provider, MD  aspirin 325 MG tablet Take 325 mg by mouth daily.     Yes Historical Provider, MD  finasteride (PROSCAR) 5 MG tablet Take 5 mg by mouth daily.   Yes Historical Provider, MD  levothyroxine (SYNTHROID, LEVOTHROID) 25 MCG tablet Take 125 mcg by mouth daily.   Yes Historical Provider, MD  lisinopril (PRINIVIL,ZESTRIL) 10 MG tablet Take 1 tablet (10 mg total) by mouth daily. 12/29/11 09/30/15 Yes Thayer Headings, MD  omeprazole  (PRILOSEC) 20 MG capsule Take 1 tablet by mouth Twice daily. 07/01/12  Yes Historical Provider, MD  oxyCODONE-acetaminophen (PERCOCET) 10-325 MG tablet Take 1 tablet by mouth every 4 (four) hours as needed for pain.   Yes Historical Provider, MD  predniSONE (DELTASONE) 20 MG tablet Take 1 tablet (20 mg total) by mouth daily with breakfast. 09/19/15  Yes Chesley Mires, MD  tiotropium (SPIRIVA) 18 MCG inhalation capsule Place 1 capsule (18 mcg total) into inhaler and inhale daily. 08/27/15  Yes Tammy S Parrett, NP  allopurinol (ZYLOPRIM) 100 MG tablet Take 1 tablet (100 mg total) by mouth 2 (two) times daily. 09/25/15   Curt Bears, MD  betamethasone dipropionate 0.05 % lotion Apply 1 drop topically 2 (two) times daily. 08/26/15   Historical Provider, MD  lidocaine-prilocaine (EMLA) cream Apply 1 application topically as needed. 09/25/15   Curt Bears, MD  predniSONE (DELTASONE) 50 MG tablet 2 tablets by mouth daily for 5 days starting with the first dose of chemotherapy every 3 weeks. 09/25/15   Curt Bears, MD  prochlorperazine (COMPAZINE) 10 MG tablet Take 1 tablet (10 mg total) by mouth every 6 (six) hours as needed for nausea or vomiting. 09/25/15   Curt Bears, MD     Vital Signs: BP 130/83 mmHg  Pulse 89  Temp(Src) 97 F (36.1 C) (Oral)  Resp 18  Ht 6' (1.829 m)  Wt 218 lb 6 oz (99.054 kg)  BMI 29.61 kg/m2  SpO2 97%  Physical Exam  Constitutional: He is oriented to person, place, and time. He appears well-developed and well-nourished.  Cardiovascular: Normal rate and  regular rhythm.   Pulmonary/Chest: Effort normal.  bilat exp wheezes  Abdominal: Soft. Bowel sounds are normal. There is tenderness.  Musculoskeletal: Normal range of motion.  Neurological: He is alert and oriented to person, place, and time.    Imaging: No results found.  Labs:  CBC:  Recent Labs  09/08/15 0957 09/08/15 1008 09/30/15 0720  WBC 12.5*  --  10.3  HGB 14.0 16.7 13.7  HCT 45.9 49.0  46.3  PLT 253  --  176    COAGS:  Recent Labs  09/12/15 1415  INR 1.1*    BMP:  Recent Labs  09/08/15 0957 09/08/15 1008  NA 136 134*  K 5.1 5.0  CL 96* 95*  CO2 28  --   GLUCOSE 102* 101*  BUN 11 13  CALCIUM 9.3  --   CREATININE 0.76 0.70  GFRNONAA >60  --   GFRAA >60  --     LIVER FUNCTION TESTS: No results for input(s): BILITOT, AST, ALT, ALKPHOS, PROT, ALBUMIN in the last 8760 hours.  Assessment and Plan: Pt with past medical history significant for hypertension, benign prostatic hypertrophy, COPD, hypothyroid, polycythemia, depression, gout, pulmonary hypertension, congestive heart failure as well as history of stage IA non-small cell lung cancer,adenocarcinoma with squamous differentiation status post wedge resection of the left upper lobe under the care of Dr. [name] in December 2011. He has been recently diagnosed with NHL and presents today for CT guided bone marrow biopsy for further evaluation.Risks and benefits discussed with the patient/wife including, but not limited to bleeding, infection, damage to adjacent structures or low yield requiring additional tests.All of the patient's questions were answered, patient is agreeable to proceed.Consent signed and in chart.     Signed: D. Rowe Robert 09/30/2015, 8:20 AM   I spent a total of 15 minutes at the the patient's bedside AND on the patient's hospital floor or unit, greater than 50% of which was counseling/coordinating care for CT guided bone marrow biopsy

## 2015-09-30 NOTE — Discharge Instructions (Signed)
Bone Marrow Aspiration and Bone Marrow Biopsy °Bone marrow aspiration and bone marrow biopsy are procedures that are done to diagnose blood disorders. You may also have one of these procedures to help diagnose infections or some types of cancer. °Bone marrow is the soft tissue that is inside your bones. Blood cells are produced in bone marrow. For bone marrow aspiration, a sample of tissue in liquid form is removed from inside your bone. For a bone marrow biopsy, a small core of bone marrow tissue is removed. Then these samples are examined under a microscope or tested in a lab. °You may need these procedures if you have an abnormal complete blood count (CBC). The aspiration or biopsy sample is usually taken from the top of your hip bone. Sometimes, an aspiration sample is taken from your chest bone (sternum). °LET YOUR HEALTH CARE PROVIDER KNOW ABOUT: °· Any allergies you have. °· All medicines you are taking, including vitamins, herbs, eye drops, creams, and over-the-counter medicines. °· Previous problems you or members of your family have had with the use of anesthetics. °· Any blood disorders you have. °· Previous surgeries you have had. °· Any medical conditions you may have. °· Whether you are pregnant or you think that you may be pregnant. °RISKS AND COMPLICATIONS °Generally, this is a safe procedure. However, problems may occur, including: °· Infection. °· Bleeding. °BEFORE THE PROCEDURE °· Ask your health care provider about: °¨ Changing or stopping your regular medicines. This is especially important if you are taking diabetes medicines or blood thinners. °¨ Taking medicines such as aspirin and ibuprofen. These medicines can thin your blood. Do not take these medicines before your procedure if your health care provider instructs you not to. °· Plan to have someone take you home after the procedure. °· If you go home right after the procedure, plan to have someone with you for 24 hours. °PROCEDURE  °· An  IV tube may be inserted into one of your veins. °· The injection site will be cleaned with a germ-killing solution (antiseptic). °· You will be given one or more of the following: °¨ A medicine that helps you relax (sedative). °¨ A medicine that numbs the area (local anesthetic). °· The bone marrow sample will be removed as follows: °¨ For an aspiration, a hollow needle will be inserted through your skin and into your bone. Bone marrow fluid will be drawn up into a syringe. °¨ For a biopsy, your health care provider will use a hollow needle to remove a core of tissue from your bone marrow. °· The needle will be removed. °· A bandage (dressing) will be placed over the insertion site and taped in place. °The procedure may vary among health care providers and hospitals. °AFTER THE PROCEDURE °· Your blood pressure, heart rate, breathing rate, and blood oxygen level will be monitored often until the medicines you were given have worn off. °· Return to your normal activities as directed by your health care provider. °  °This information is not intended to replace advice given to you by your health care provider. Make sure you discuss any questions you have with your health care provider. °  °Document Released: 09/10/2004 Document Revised: 01/22/2015 Document Reviewed: 08/29/2014 °Elsevier Interactive Patient Education ©2016 [employer] ° °Bone Marrow Aspiration and Bone Marrow Biopsy, Care After °Refer to this sheet in the next few weeks. These instructions provide you with information about caring for yourself after your procedure. Your health care provider may also give   more specific instructions. Your treatment has been planned according to current medical practices, but problems sometimes occur. Call your health care provider if you have any problems or questions after your procedure. WHAT TO EXPECT AFTER THE PROCEDURE After your procedure, it is common to have:  Soreness or tenderness around the puncture  site.  Bruising. HOME CARE INSTRUCTIONS  Take medicines only as directed by your health care provider.  Follow your health care provider's instructions about:  Puncture site care.  Bandage (dressing) changes and removal.  Bathe and shower as directed by your health care provider.  Check your puncture site every day for signs of infection. Watch for:  Redness, swelling, or pain.  Fluid, blood, or pus.  Return to your normal activities as directed by your health care provider.  Keep all follow-up visits as directed by your health care provider. This is important. SEEK MEDICAL CARE IF:  You have a fever.  You have uncontrollable bleeding.  You have redness, swelling, or pain at the site of your puncture.  You have fluid, blood, or pus coming from your puncture site.   This information is not intended to replace advice given to you by your health care provider. Make sure you discuss any questions you have with your health care provider.   Document Released: 03/27/2005 Document Revised: 01/22/2015 Document Reviewed: 08/29/2014 Elsevier Interactive Patient Education 2016 Else[employer] Inc[employer] Moderate Conscious Sedation, Adult Sedation is the use of medicines to promote relaxation and relieve discomfort and anxiety. Moderate conscious sedation is a type of sedation. Under moderate conscious sedation you are less alert than normal but are still able to respond to instructions or stimulation. Moderate conscious sedation is used during short medical and dental procedures. It is milder than deep sedation or general anesthesia and allows you to return to your regular activities sooner. LET [facility] CARE PROVIDER KNOW ABOUT:   Any allergies you have.  All medicines you are taking, including vitamins, herbs, eye drops, creams, and over-the-counter medicines.  Use of steroids (by mouth or creams).  Previous problems you or members of your family have had with the use of  anesthetics.  Any blood disorders you have.  Previous surgeries you have had.  Medical conditions you have.  Possibility of pregnancy, if this applies.  Use of cigarettes, alcohol, or illegal drugs. RISKS AND COMPLICATIONS Generally, this is a safe procedure. However, as with any procedure, problems can occur. Possible problems include:  Oversedation.  Trouble breathing on your own. You may need to have a breathing tube until you are awake and breathing on your own.  Allergic reaction to any of the medicines used for the procedure. BEFORE THE PROCEDURE  You may have blood tests done. These tests can help show how well your kidneys and liver are working. They can also show how well your blood clots.  A physical exam will be done.  Only take medicines as directed by your health care provider. You may need to stop taking medicines (such as blood thinners, aspirin, or nonsteroidal anti-inflammatory drugs) before the procedure.   Do not eat or drink at least 6 hours before the procedure or as directed by your health care provider.  Arrange for a responsible adult, family member, or friend to take you home after the procedure. He or she should stay with you for at least 24 hours after the procedure, until the medicine has worn off. PROCEDURE   An intravenous (IV) catheter will be inserted into one of your  veins. Medicine will be able to flow directly into your body through this catheter. You may be given medicine through this tube to help prevent pain and help you relax.  The medical or dental procedure will be done. AFTER THE PROCEDURE  You will stay in a recovery area until the medicine has worn off. Your blood pressure and pulse will be checked.   Depending on the procedure you had, you may be allowed to go home when you can tolerate liquids and your pain is under control.   This information is not intended to replace advice given to you by your health care provider. Make  sure you discuss any questions you have with your health care provider.   Document Released: 06/02/2001 Document Revised: 09/28/2014 Document Reviewed: 05/15/2013 Moderate Conscious Sedation, Adult, Care After Refer to this sheet in the next few weeks. These instructions provide you with information on caring for yourself after your procedure. Your health care provider may also give you more specific instructions. Your treatment has been planned according to current medical practices, but problems sometimes occur. Call your health care provider if you have any problems or questions after your procedure. WHAT TO EXPECT AFTER THE PROCEDURE  After your procedure:  You may feel sleepy, clumsy, and have poor balance for several hours.  Vomiting may occur if you eat too soon after the procedure. HOME CARE INSTRUCTIONS  Do not participate in any activities where you could become injured for at least 24 hours. Do not:  Drive.  Swim.  Ride a bicycle.  Operate heavy machinery.  Cook.  Use power tools.  Climb ladders.  Work from a high place.  Do not make important decisions or sign legal documents until you are improved.  If you vomit, drink water, juice, or soup when you can drink without vomiting. Make sure you have little or no nausea before eating solid foods.  Only take over-the-counter or prescription medicines for pain, discomfort, or fever as directed by your health care provider.  Make sure you and your family fully understand everything about the medicines given to you, including what side effects may occur.  You should not drink alcohol, take sleeping pills, or take medicines that cause drowsiness for at least 24 hours.  If you smoke, do not smoke without supervision.  If you are feeling better, you may resume normal activities 24 hours after you were sedated.  Keep all appointments with your health care provider. SEEK MEDICAL CARE IF:  Your skin is pale or bluish in  color.  You continue to feel nauseous or vomit.  Your pain is getting worse and is not helped by medicine.  You have bleeding or swelling.  You are still sleepy or feeling clumsy after 24 hours. SEEK IMMEDIATE MEDICAL CARE IF:  You develop a rash.  You have difficulty breathing.  You develop any type of allergic problem.  You have a fever. MAKE SURE YOU:  Understand these instructions.  Will watch your condition.  Will get help right away if you are not doing well or get worse.   This information is not intended to replace advice given to you by your health care provider. Make sure you discuss any questions you have with your health care provider.   Document Released: 06/28/2013 Document Revised: 09/28/2014 Document Reviewed: 06/28/2013 Elsevier Interactive Patient Education 2016 [employer]. Chartered certified accountant Patient Education [employer].

## 2015-09-30 NOTE — Progress Notes (Signed)
  Echocardiogram 2D Echocardiogram has been performed.  Jennette Dubin 09/30/2015, 2:57 PM

## 2015-10-01 ENCOUNTER — Ambulatory Visit (HOSPITAL_BASED_OUTPATIENT_CLINIC_OR_DEPARTMENT_OTHER): Payer: PPO

## 2015-10-01 ENCOUNTER — Other Ambulatory Visit: Payer: PPO

## 2015-10-01 VITALS — BP 115/63 | HR 95 | Temp 98.5°F | Resp 18

## 2015-10-01 DIAGNOSIS — C8258 Diffuse follicle center lymphoma, lymph nodes of multiple sites: Secondary | ICD-10-CM

## 2015-10-01 DIAGNOSIS — C8518 Unspecified B-cell lymphoma, lymph nodes of multiple sites: Secondary | ICD-10-CM

## 2015-10-01 DIAGNOSIS — Z5112 Encounter for antineoplastic immunotherapy: Secondary | ICD-10-CM

## 2015-10-01 DIAGNOSIS — Z5111 Encounter for antineoplastic chemotherapy: Secondary | ICD-10-CM | POA: Diagnosis not present

## 2015-10-01 LAB — HEPATITIS PANEL, ACUTE
HBsAg Screen: NEGATIVE
HEP B C IGM: NEGATIVE
Hep A Ab, IgM: NEGATIVE
Hep C Virus Ab: <0.1 s/co ratio (ref 0.0–0.9)

## 2015-10-01 MED ORDER — ACETAMINOPHEN 325 MG PO TABS
650.0000 mg | ORAL_TABLET | Freq: Once | ORAL | Status: AC
  Administered 2015-10-01: 650 mg via ORAL

## 2015-10-01 MED ORDER — SODIUM CHLORIDE 0.9 % IV SOLN
Freq: Once | INTRAVENOUS | Status: AC
  Administered 2015-10-01: 09:00:00 via INTRAVENOUS

## 2015-10-01 MED ORDER — SODIUM CHLORIDE 0.9 % IV SOLN
375.0000 mg/m2 | Freq: Once | INTRAVENOUS | Status: AC
  Administered 2015-10-01: 800 mg via INTRAVENOUS
  Filled 2015-10-01: qty 80

## 2015-10-01 MED ORDER — DOXORUBICIN HCL CHEMO IV INJECTION 2 MG/ML
50.0000 mg/m2 | Freq: Once | INTRAVENOUS | Status: AC
  Administered 2015-10-01: 112 mg via INTRAVENOUS
  Filled 2015-10-01: qty 56

## 2015-10-01 MED ORDER — VINCRISTINE SULFATE CHEMO INJECTION 1 MG/ML
2.0000 mg | Freq: Once | INTRAVENOUS | Status: AC
  Administered 2015-10-01: 2 mg via INTRAVENOUS
  Filled 2015-10-01: qty 2

## 2015-10-01 MED ORDER — ACETAMINOPHEN 325 MG PO TABS
ORAL_TABLET | ORAL | Status: AC
  Filled 2015-10-01: qty 2

## 2015-10-01 MED ORDER — DIPHENHYDRAMINE HCL 25 MG PO CAPS
ORAL_CAPSULE | ORAL | Status: AC
  Filled 2015-10-01: qty 2

## 2015-10-01 MED ORDER — DIPHENHYDRAMINE HCL 25 MG PO CAPS
50.0000 mg | ORAL_CAPSULE | Freq: Once | ORAL | Status: AC
  Administered 2015-10-01: 50 mg via ORAL

## 2015-10-01 MED ORDER — SODIUM CHLORIDE 0.9 % IV SOLN
Freq: Once | INTRAVENOUS | Status: AC
  Administered 2015-10-01: 09:00:00 via INTRAVENOUS
  Filled 2015-10-01: qty 8

## 2015-10-01 MED ORDER — CYCLOPHOSPHAMIDE CHEMO INJECTION 1 GM
750.0000 mg/m2 | Freq: Once | INTRAMUSCULAR | Status: AC
  Administered 2015-10-01: 1680 mg via INTRAVENOUS
  Filled 2015-10-01: qty 84

## 2015-10-01 NOTE — Patient Instructions (Signed)
Cyclophosphamide injection What is this medicine? CYCLOPHOSPHAMIDE (sye kloe FOSS fa mide) is a chemotherapy drug. It slows the growth of cancer cells. This medicine is used to treat many types of cancer like lymphoma, myeloma, leukemia, breast cancer, and ovarian cancer, to name a few. This medicine may be used for other purposes; ask your health care provider or pharmacist if you have questions. What should I tell my health care provider before I take this medicine? They need to know if you have any of these conditions: -blood disorders -history of other chemotherapy -infection -kidney disease -liver disease -recent or ongoing radiation therapy -tumors in the bone marrow -an unusual or allergic reaction to cyclophosphamide, other chemotherapy, other medicines, foods, dyes, or preservatives -pregnant or trying to get pregnant -breast-feeding How should I use this medicine? This drug is usually given as an injection into a vein or muscle or by infusion into a vein. It is administered in a hospital or clinic by a specially trained health care professional. Talk to your pediatrician regarding the use of this medicine in children. Special care may be needed. Overdosage: If you think you have taken too much of this medicine contact a poison control center or emergency room at once. NOTE: This medicine is only for you. Do not share this medicine with others. What if I miss a dose? It is important not to miss your dose. Call your doctor or health care professional if you are unable to keep an appointment. What may interact with this medicine? This medicine may interact with the following medications: -amiodarone -amphotericin B -azathioprine -certain antiviral medicines for HIV or AIDS such as protease inhibitors (e.g., indinavir, ritonavir) and zidovudine -certain blood pressure medications such as benazepril, captopril, enalapril, fosinopril, lisinopril, moexipril, monopril, perindopril,  quinapril, ramipril, trandolapril -certain cancer medications such as anthracyclines (e.g., daunorubicin, doxorubicin), busulfan, cytarabine, paclitaxel, pentostatin, tamoxifen, trastuzumab -certain diuretics such as chlorothiazide, chlorthalidone, hydrochlorothiazide, indapamide, metolazone -certain medicines that treat or prevent blood clots like warfarin -certain muscle relaxants such as succinylcholine -cyclosporine -etanercept -indomethacin -medicines to increase blood counts like filgrastim, pegfilgrastim, sargramostim -medicines used as general anesthesia -metronidazole -natalizumab This list may not describe all possible interactions. Give your health care provider a list of all the medicines, herbs, non-prescription drugs, or dietary supplements you use. Also tell them if you smoke, drink alcohol, or use illegal drugs. Some items may interact with your medicine. What should I watch for while using this medicine? Visit your doctor for checks on your progress. This drug may make you feel generally unwell. This is not uncommon, as chemotherapy can affect healthy cells as well as cancer cells. Report any side effects. Continue your course of treatment even though you feel ill unless your doctor tells you to stop. Drink water or other fluids as directed. Urinate often, even at night. In some cases, you may be given additional medicines to help with side effects. Follow all directions for their use. Call your doctor or health care professional for advice if you get a fever, chills or sore throat, or other symptoms of a cold or flu. Do not treat yourself. This drug decreases your body's ability to fight infections. Try to avoid being around people who are sick. This medicine may increase your risk to bruise or bleed. Call your doctor or health care professional if you notice any unusual bleeding. Be careful brushing and flossing your teeth or using a toothpick because you may get an infection or  bleed more easily. If you have  any dental work done, tell your dentist you are receiving this medicine. You may get drowsy or dizzy. Do not drive, use machinery, or do anything that needs mental alertness until you know how this medicine affects you. Do not become pregnant while taking this medicine or for 1 year after stopping it. Women should inform their doctor if they wish to become pregnant or think they might be pregnant. Men should not father a child while taking this medicine and for 4 months after stopping it. There is a potential for serious side effects to an unborn child. Talk to your health care professional or pharmacist for more information. Do not breast-feed an infant while taking this medicine. This medicine may interfere with the ability to have a child. This medicine has caused ovarian failure in some women. This medicine has caused reduced sperm counts in some men. You should talk with your doctor or health care professional if you are concerned about your fertility. If you are going to have surgery, tell your doctor or health care professional that you have taken this medicine. What side effects may I notice from receiving this medicine? Side effects that you should report to your doctor or health care professional as soon as possible: -allergic reactions like skin rash, itching or hives, swelling of the face, lips, or tongue -low blood counts - this medicine may decrease the number of white blood cells, red blood cells and platelets. You may be at increased risk for infections and bleeding. -signs of infection - fever or chills, cough, sore throat, pain or difficulty passing urine -signs of decreased platelets or bleeding - bruising, pinpoint red spots on the skin, black, tarry stools, blood in the urine -signs of decreased red blood cells - unusually weak or tired, fainting spells, lightheadedness -breathing problems -dark urine -dizziness -palpitations -swelling of the  ankles, feet, hands -trouble passing urine or change in the amount of urine -weight gain -yellowing of the eyes or skin Side effects that usually do not require medical attention (report to your doctor or health care professional if they continue or are bothersome): -changes in nail or skin color -hair loss -missed menstrual periods -mouth sores -nausea, vomiting This list may not describe all possible side effects. Call your doctor for medical advice about side effects. You may report side effects to FDA at 1-800-FDA-1088. Where should I keep my medicine? This drug is given in a hospital or clinic and will not be stored at home. NOTE: This sheet is a summary. It may not cover all possible information. If you have questions about this medicine, talk to your doctor, pharmacist, or health care provider.    2016, [employer]. (2012-07-22 16:22:58) Vincristine injection What is this medicine? VINCRISTINE (vin KRIS teen) is a chemotherapy drug. It slows the growth of cancer cells. This medicine is used to treat many types of cancer like Hodgkin's disease, leukemia, non-Hodgkin's lymphoma, neuroblastoma (brain cancer), rhabdomyosarcoma, and Wilms' tumor. This medicine may be used for other purposes; ask your health care provider or pharmacist if you have questions. What should I tell my health care provider before I take this medicine? They need to know if you have any of these conditions: -blood disorders -gout -infection (especially chickenpox, cold sores, or herpes) -kidney disease -liver disease -lung disease -nervous system disease like Charcot-Marie-Tooth (CMT) -recent or ongoing radiation therapy -an unusual or allergic reaction to vincristine, other chemotherapy agents, other medicines, foods, dyes, or preservatives -pregnant or trying to get pregnant -breast-feeding How  should I use this medicine? This drug is given as an infusion into a vein. It is administered in a  hospital or clinic by a specially trained health care professional. If you have pain, swelling, burning, or any unusual feeling around the site of your injection, tell your health care professional right away. Talk to your pediatrician regarding the use of this medicine in children. While this drug may be prescribed for selected conditions, precautions do apply. Overdosage: If you think you have taken too much of this medicine contact a poison control center or emergency room at once. NOTE: This medicine is only for you. Do not share this medicine with others. What if I miss a dose? It is important not to miss your dose. Call your doctor or health care professional if you are unable to keep an appointment. What may interact with this medicine? Do not take this medicine with any of the following medications: -itraconazole -mibefradil -voriconazole This medicine may also interact with the following medications: -cyclosporine -erythromycin -fluconazole -ketoconazole -medicines for HIV like delavirdine, efavirenz, nevirapine -medicines for seizures like ethotoin, fosphenotoin, phenytoin -medicines to increase blood counts like filgrastim, pegfilgrastim, sargramostim -other chemotherapy drugs like cisplatin, L-asparaginase, methotrexate, mitomycin, paclitaxel -pegaspargase -vaccines -zalcitabine, ddC Talk to your doctor or health care professional before taking any of these medicines: -acetaminophen -aspirin -ibuprofen -ketoprofen -naproxen This list may not describe all possible interactions. Give your health care provider a list of all the medicines, herbs, non-prescription drugs, or dietary supplements you use. Also tell them if you smoke, drink alcohol, or use illegal drugs. Some items may interact with your medicine. What should I watch for while using this medicine? Your condition will be monitored carefully while you are receiving this medicine. You will need important blood work  done while you are taking this medicine. This drug may make you feel generally unwell. This is not uncommon, as chemotherapy can affect healthy cells as well as cancer cells. Report any side effects. Continue your course of treatment even though you feel ill unless your doctor tells you to stop. In some cases, you may be given additional medicines to help with side effects. Follow all directions for their use. Call your doctor or health care professional for advice if you get a fever, chills or sore throat, or other symptoms of a cold or flu. Do not treat yourself. Avoid taking products that contain aspirin, acetaminophen, ibuprofen, naproxen, or ketoprofen unless instructed by your doctor. These medicines may hide a fever. Do not become pregnant while taking this medicine. Women should inform their doctor if they wish to become pregnant or think they might be pregnant. There is a potential for serious side effects to an unborn child. Talk to your health care professional or pharmacist for more information. Do not breast-feed an infant while taking this medicine. Men may have a lower sperm count while taking this medicine. Talk to your doctor if you plan to father a child. What side effects may I notice from receiving this medicine? Side effects that you should report to your doctor or health care professional as soon as possible: -allergic reactions like skin rash, itching or hives, swelling of the face, lips, or tongue -breathing problems -confusion or changes in emotions or moods -constipation -cough -mouth sores -muscle weakness -nausea and vomiting -pain, swelling, redness or irritation at the injection site -pain, tingling, numbness in the hands or feet -problems with balance, talking, walking -seizures -stomach pain -trouble passing urine or change in the amount  of urine Side effects that usually do not require medical attention (report to your doctor or health care professional if they  continue or are bothersome): -diarrhea -hair loss -jaw pain -loss of appetite This list may not describe all possible side effects. Call your doctor for medical advice about side effects. You may report side effects to FDA at 1-800-FDA-1088. Where should I keep my medicine? This drug is given in a hospital or clinic and will not be stored at home. NOTE: This sheet is a summary. It may not cover all possible information. If you have questions about this medicine, talk to your doctor, pharmacist, or health care provider.    2016, [employer]. (2008-06-04 17:17:13) Doxorubicin injection What is this medicine? DOXORUBICIN (dox oh ROO bi sin) is a chemotherapy drug. It is used to treat many kinds of cancer like Hodgkin's disease, leukemia, non-Hodgkin's lymphoma, neuroblastoma, sarcoma, and Wilms' tumor. It is also used to treat bladder cancer, breast cancer, lung cancer, ovarian cancer, stomach cancer, and thyroid cancer. This medicine may be used for other purposes; ask your health care provider or pharmacist if you have questions. What should I tell my health care provider before I take this medicine? They need to know if you have any of these conditions: -blood disorders -heart disease, recent heart attack -infection (especially a virus infection such as chickenpox, cold sores, or herpes) -irregular heartbeat -liver disease -recent or ongoing radiation therapy -an unusual or allergic reaction to doxorubicin, other chemotherapy agents, other medicines, foods, dyes, or preservatives -pregnant or trying to get pregnant -breast-feeding How should I use this medicine? This drug is given as an infusion into a vein. It is administered in a hospital or clinic by a specially trained health care professional. If you have pain, swelling, burning or any unusual feeling around the site of your injection, tell your health care professional right away. Talk to your pediatrician regarding the  use of this medicine in children. Special care may be needed. Overdosage: If you think you have taken too much of this medicine contact a poison control center or emergency room at once. NOTE: This medicine is only for you. Do not share this medicine with others. What if I miss a dose? It is important not to miss your dose. Call your doctor or health care professional if you are unable to keep an appointment. What may interact with this medicine? Do not take this medicine with any of the following medications: -cisapride -droperidol -halofantrine -pimozide -zidovudine This medicine may also interact with the following medications: -chloroquine -chlorpromazine -clarithromycin -cyclophosphamide -cyclosporine -erythromycin -medicines for depression, anxiety, or psychotic disturbances -medicines for irregular heart beat like amiodarone, bepridil, dofetilide, encainide, flecainide, propafenone, quinidine -medicines for seizures like ethotoin, fosphenytoin, phenytoin -medicines for nausea, vomiting like dolasetron, ondansetron, palonosetron -medicines to increase blood counts like filgrastim, pegfilgrastim, sargramostim -methadone -methotrexate -pentamidine -progesterone -vaccines -verapamil Talk to your doctor or health care professional before taking any of these medicines: -acetaminophen -aspirin -ibuprofen -ketoprofen -naproxen This list may not describe all possible interactions. Give your health care provider a list of all the medicines, herbs, non-prescription drugs, or dietary supplements you use. Also tell them if you smoke, drink alcohol, or use illegal drugs. Some items may interact with your medicine. What should I watch for while using this medicine? Your condition will be monitored carefully while you are receiving this medicine. You will need important blood work done while you are taking this medicine. This drug may make you feel generally unwell.  This is not  uncommon, as chemotherapy can affect healthy cells as well as cancer cells. Report any side effects. Continue your course of treatment even though you feel ill unless your doctor tells you to stop. Your urine may turn red for a few days after your dose. This is not blood. If your urine is dark or brown, call your doctor. In some cases, you may be given additional medicines to help with side effects. Follow all directions for their use. Call your doctor or health care professional for advice if you get a fever, chills or sore throat, or other symptoms of a cold or flu. Do not treat yourself. This drug decreases your body's ability to fight infections. Try to avoid being around people who are sick. This medicine may increase your risk to bruise or bleed. Call your doctor or health care professional if you notice any unusual bleeding. Be careful brushing and flossing your teeth or using a toothpick because you may get an infection or bleed more easily. If you have any dental work done, tell your dentist you are receiving this medicine. Avoid taking products that contain aspirin, acetaminophen, ibuprofen, naproxen, or ketoprofen unless instructed by your doctor. These medicines may hide a fever. Men and women of childbearing age should use effective birth control methods while using taking this medicine. Do not become pregnant while taking this medicine. There is a potential for serious side effects to an unborn child. Talk to your health care professional or pharmacist for more information. Do not breast-feed an infant while taking this medicine. Do not let others touch your urine or other body fluids for 5 days after each treatment with this medicine. Caregivers should wear latex gloves to avoid touching body fluids during this time. There is a maximum amount of this medicine you should receive throughout your life. The amount depends on the medical condition being treated and your overall health. Your doctor  will watch how much of this medicine you receive in your lifetime. Tell your doctor if you have taken this medicine before. What side effects may I notice from receiving this medicine? Side effects that you should report to your doctor or health care professional as soon as possible: -allergic reactions like skin rash, itching or hives, swelling of the face, lips, or tongue -low blood counts - this medicine may decrease the number of white blood cells, red blood cells and platelets. You may be at increased risk for infections and bleeding. -signs of infection - fever or chills, cough, sore throat, pain or difficulty passing urine -signs of decreased platelets or bleeding - bruising, pinpoint red spots on the skin, black, tarry stools, blood in the urine -signs of decreased red blood cells - unusually weak or tired, fainting spells, lightheadedness -breathing problems -chest pain -fast, irregular heartbeat -mouth sores -nausea, vomiting -pain, swelling, redness at site where injected -pain, tingling, numbness in the hands or feet -swelling of ankles, feet, or hands -unusual bleeding or bruising Side effects that usually do not require medical attention (report to your doctor or health care professional if they continue or are bothersome): -diarrhea -facial flushing -hair loss -loss of appetite -missed menstrual periods -nail discoloration or damage -red or watery eyes -red colored urine -stomach upset This list may not describe all possible side effects. Call your doctor for medical advice about side effects. You may report side effects to FDA at 1-800-FDA-1088. Where should I keep my medicine? This drug is given in a hospital or clinic  and will not be stored at home. NOTE: This sheet is a summary. It may not cover all possible information. If you have questions about this medicine, talk to your doctor, pharmacist, or health care provider.    2016, [employer]. (2013-01-03  09:54:34) Rituximab injection What is this medicine? RITUXIMAB (ri TUX i mab) is a monoclonal antibody. It is used commonly to treat non-Hodgkin lymphoma and other conditions. It is also used to treat rheumatoid arthritis (RA). In RA, this medicine slows the inflammatory process and help reduce joint pain and swelling. This medicine is often used with other cancer or arthritis medications. This medicine may be used for other purposes; ask your health care provider or pharmacist if you have questions. What should I tell my health care provider before I take this medicine? They need to know if you have any of these conditions: -blood disorders -heart disease -history of hepatitis B -infection (especially a virus infection such as chickenpox, cold sores, or herpes) -irregular heartbeat -kidney disease -lung or breathing disease, like asthma -lupus -an unusual or allergic reaction to rituximab, mouse proteins, other medicines, foods, dyes, or preservatives -pregnant or trying to get pregnant -breast-feeding How should I use this medicine? This medicine is for infusion into a vein. It is administered in a hospital or clinic by a specially trained health care professional. A special MedGuide will be given to you by the pharmacist with each prescription and refill. Be sure to read this information carefully each time. Talk to your pediatrician regarding the use of this medicine in children. This medicine is not approved for use in children. Overdosage: If you think you have taken too much of this medicine contact a poison control center or emergency room at once. NOTE: This medicine is only for you. Do not share this medicine with others. What if I miss a dose? It is important not to miss a dose. Call your doctor or health care professional if you are unable to keep an appointment. What may interact with this medicine? -cisplatin -medicines for blood pressure -some other medicines for  arthritis -vaccines This list may not describe all possible interactions. Give your health care provider a list of all the medicines, herbs, non-prescription drugs, or dietary supplements you use. Also tell them if you smoke, drink alcohol, or use illegal drugs. Some items may interact with your medicine. What should I watch for while using this medicine? Report any side effects that you notice during your treatment right away, such as changes in your breathing, fever, chills, dizziness or lightheadedness. These effects are more common with the first dose. Visit your prescriber or health care professional for checks on your progress. You will need to have regular blood work. Report any other side effects. The side effects of this medicine can continue after you finish your treatment. Continue your course of treatment even though you feel ill unless your doctor tells you to stop. Call your doctor or health care professional for advice if you get a fever, chills or sore throat, or other symptoms of a cold or flu. Do not treat yourself. This drug decreases your body's ability to fight infections. Try to avoid being around people who are sick. This medicine may increase your risk to bruise or bleed. Call your doctor or health care professional if you notice any unusual bleeding. Be careful brushing and flossing your teeth or using a toothpick because you may get an infection or bleed more easily. If you have any dental work  done, tell your dentist you are receiving this medicine. Avoid taking products that contain aspirin, acetaminophen, ibuprofen, naproxen, or ketoprofen unless instructed by your doctor. These medicines may hide a fever. Do not become pregnant while taking this medicine. Women should inform their doctor if they wish to become pregnant or think they might be pregnant. There is a potential for serious side effects to an unborn child. Talk to your health care professional or pharmacist for more  information. Do not breast-feed an infant while taking this medicine. What side effects may I notice from receiving this medicine? Side effects that you should report to your doctor or health care professional as soon as possible: -allergic reactions like skin rash, itching or hives, swelling of the face, lips, or tongue -low blood counts - this medicine may decrease the number of white blood cells, red blood cells and platelets. You may be at increased risk for infections and bleeding. -signs of infection - fever or chills, cough, sore throat, pain or difficulty passing urine -signs of decreased platelets or bleeding - bruising, pinpoint red spots on the skin, black, tarry stools, blood in the urine -signs of decreased red blood cells - unusually weak or tired, fainting spells, lightheadedness -breathing problems -confused, not responsive -chest pain -fast, irregular heartbeat -feeling faint or lightheaded, falls -mouth sores -redness, blistering, peeling or loosening of the skin, including inside the mouth -stomach pain -swelling of the ankles, feet, or hands -trouble passing urine or change in the amount of urine Side effects that usually do not require medical attention (report to your doctor or other health care professional if they continue or are bothersome): -anxiety -headache -loss of appetite -muscle aches -nausea -night sweats This list may not describe all possible side effects. Call your doctor for medical advice about side effects. You may report side effects to FDA at 1-800-FDA-1088. Where should I keep my medicine? This drug is given in a hospital or clinic and will not be stored at home. NOTE: This sheet is a summary. It may not cover all possible information. If you have questions about this medicine, talk to your doctor, pharmacist, or health care provider.    2016, [employer]vier/Gold Standard. (2014-11-14 22:30:56)

## 2015-10-02 ENCOUNTER — Other Ambulatory Visit: Payer: Self-pay | Admitting: Radiology

## 2015-10-02 ENCOUNTER — Ambulatory Visit (INDEPENDENT_AMBULATORY_CARE_PROVIDER_SITE_OTHER): Payer: PPO | Admitting: Acute Care

## 2015-10-02 ENCOUNTER — Encounter: Payer: Self-pay | Admitting: Acute Care

## 2015-10-02 VITALS — BP 150/80 | HR 88 | Temp 98.0°F | Ht 72.0 in | Wt 231.6 lb

## 2015-10-02 DIAGNOSIS — C8258 Diffuse follicle center lymphoma, lymph nodes of multiple sites: Secondary | ICD-10-CM | POA: Diagnosis not present

## 2015-10-02 DIAGNOSIS — R1319 Other dysphagia: Secondary | ICD-10-CM

## 2015-10-02 DIAGNOSIS — J449 Chronic obstructive pulmonary disease, unspecified: Secondary | ICD-10-CM | POA: Diagnosis not present

## 2015-10-02 NOTE — Patient Instructions (Addendum)
It's good to meet you today. Continue prednisone as prescribed to help your swallow. Continue wearing your oxygen at 2L for activity as needed and at bedtime. Continue using your Spiriva every day for your COPD. Take Zyrtec as needed for your sinuses. Call us early if you feel a flare coming on so we can be proactive in your care. Follow up appointment with Dr. Chase Caller in 2 months  ( March 8th, 2017) Please contact office for sooner follow up if symptoms do not improve or worsen or seek emergency care

## 2015-10-02 NOTE — Assessment & Plan Note (Addendum)
Gold Stage 2, COPD well controlled and at baseline today per patient.  Plan: Continue Spiriva Q day O2 @ 2L at bedtime and as needed for desaturations with activity Prevnar booster per Dr.Mohamed after counts rebound and before next chemo dose.( Per Dr. Julien Nordmann)

## 2015-10-02 NOTE — Assessment & Plan Note (Addendum)
Newly diagnosed high Grade B Cell Lymphoma. First Chemo dose 10/01/15 For port-a-cath insertion 10/03/15  Plan> Per Dr. Mohamed/ oncology

## 2015-10-02 NOTE — Progress Notes (Addendum)
Subjective:    Patient ID: [name], male    DOB: 04-Jan-1951, 65 y.o.   MRN: [id number]  HPI 65 yo former smoker ( Quit 2003) with COPD GOLD II , MM genotype and previous lung cancer 2011 , recently diagnosed with High Grade B-Cell Lymphoma, started chemo treatment 10/01/15.  Significant Studies/ Events :  # S/p LUL wedge resection for stage 1Asquamous cell lung cancer [name]) December 2011.  - dec 2012 CT chest: no recurrence - cxr May 2013: clear # Recurrent RML and RLL pna due to dysphagia/aspoiration - in Jan and Feb 2012 with subsequent clearance April 2012 cxr, Dec 2012 CT - Oct 17, 2011 - [facility] report as bilateral pna -> No evidence March 2013 [facility] cxr  CT chest 2013 , no evidence of local recurrent or mets , stable scarring in both lungs. Tree in bud in RUL ? Post inflammatory .   08/29/15: CT Chest Findings  1. Bulky lymphadenopathy in the chest and upper abdomen along with splenomegaly. Findings are most likely due to lymphoma. Metastatic lung cancer is felt to be very unlikely. Recommend CT abdomen/pelvis with contrast for further evaluation. There may be inguinal nodes that could be biopsied. If not, the supraclavicular nodes may be amenable to ultrasound-guided biopsy. 2. Stable scarring changes involving the lungs along with bibasilar atelectasis. I do not see any worrisome pulmonary lesions. 3. A small splenic infarct is noted.  09/08/15 :CT ANGIOGRAPHY CHEST WITH CONTRAST  IMPRESSION: Persistent neck, hilar, mediastinal and retroperitoneal lymphadenopathy with associated splenomegaly suspicious for lymphoma.  Mild right basilar atelectasis is noted  No evidence of pulmonary emboli is seen. Some narrowing of the trachea is noted.  09/11/15: PET Scan:  IMPRESSION: 1. Examination is positive for extensive hypermetabolic adenopathy involving the neck, chest, abdomen and pelvis. The  appearance of today's scan seems somewhat atypical for recurrent metastatic lung cancer and would be more fitting with lymphoma. Correlation with tissue sampling is recommended. 2. Extensive tumor involving the spleen is identified with a areas of splenic infarct  09/18/15 CT Guided Biopsy: Left Retroperitoneal lymph node. Results indicate a high grade B cell lymphoma  09/30/15: CT Guided Bone Marrow Biopsy: Results pending  10/01/15: First Chemo Treatment  10/03/15:  Pending placement of Port-a-cath  10/02/15 Follow up biopsy office visit  Pt. Presents for follow up/ discussion of biopsy results today. He had been seen by Dr. Julien Nordmann and has already had his first chemo treatment.He had an appointment 09/25/15 with Dr. Julien Nordmann with discussion about his biopsy results diagnosis and treatment plan.He is doing remarkably well after his first chemo treatment yesterday.His biopsy site is unremarkable.We discussed the need to monitor site for redness or drainage, and to notify us immediately if he has a fever. He continues  To take his Spiriva for his COPD and has noticed it helps a great deal. He states he also take Zyrtec as needed for sinus drainage. We discussed letting us know if drainage becomes colorful.Denies any hemoptysis, chest pain, orthopnea, PND, or increased leg swelling. He is using O2 at 2 L at night and as needed during the day for dyspnea. He continues to have a swollen testicle that is being monitored by urology for changes, they had diagnosed a hydrocele.He states that it is unchanged. His dysphagia has improved with the prednisone treatment.  Past Medical History  Diagnosis Date  . Streptococcal pneumonia [facility]) August 2009  . Congestive heart failure ([place])   . Gastrointestinal obstruction ([place])  Ileus  . Pulmonary hypertension ([place])   . Polycythemia   . Hypothyroid   . COPD (chronic obstructive pulmonary disease) ([place])   . Obesity   . Gout   . BPH (benign prostatic  hyperplasia) 2011  . Depression   . Hypertension   . Lung cancer ([place])   . NHL (non-Hodgkin's lymphoma) ([place]) 09/25/2015    Current outpatient prescriptions:  .  albuterol (PROVENTIL) (2.5 MG/3ML) 0.083% nebulizer solution, INHALE 3 MILLILITERS (2.5 MG) BY NEBULIZATION ROUTE EVERY 8 HOURS AS NEEDED, Disp: , Rfl: 3 .  allopurinol (ZYLOPRIM) 100 MG tablet, Take 1 tablet (100 mg total) by mouth 2 (two) times daily., Disp: 60 tablet, Rfl: 4 .  ALPRAZolam (XANAX) 0.5 MG tablet, Take 0.5 mg by mouth 2 (two) times daily as needed for anxiety., Disp: , Rfl:  .  amLODipine (NORVASC) 5 MG tablet, Take 1 tablet by mouth Daily., Disp: , Rfl:  .  aspirin 325 MG tablet, Take 325 mg by mouth daily.  , Disp: , Rfl:  .  betamethasone dipropionate 0.05 % lotion, Apply 1 drop topically 2 (two) times daily as needed. , Disp: , Rfl: 3 .  clotrimazole (MYCELEX) 10 MG troche, Take 1 lozenge (10 mg total) by mouth 5 (five) times daily., Disp: 30 lozenge, Rfl: 2 .  finasteride (PROSCAR) 5 MG tablet, Take 5 mg by mouth daily., Disp: , Rfl:  .  levothyroxine (SYNTHROID, LEVOTHROID) 25 MCG tablet, Take 125 mcg by mouth daily., Disp: , Rfl:  .  lidocaine-prilocaine (EMLA) cream, Apply 1 application topically as needed., Disp: 30 g, Rfl: 0 .  lisinopril (PRINIVIL,ZESTRIL) 10 MG tablet, Take 1 tablet (10 mg total) by mouth daily., Disp: 30 tablet, Rfl: 11 .  magic mouthwash w/lidocaine SOLN, Take 5 mLs by mouth 3 (three) times daily., Disp: 120 mL, Rfl: 0 .  omeprazole (PRILOSEC) 20 MG capsule, Take 1 tablet by mouth Twice daily., Disp: , Rfl:  .  oxyCODONE-acetaminophen (PERCOCET) 10-325 MG tablet, Take 1 tablet by mouth every 4 (four) hours as needed for pain., Disp: , Rfl:  .  predniSONE (DELTASONE) 50 MG tablet, 2 tablets by mouth daily for 5 days starting with the first dose of chemotherapy every 3 weeks., Disp: 80 tablet, Rfl: 0 .  prochlorperazine (COMPAZINE) 10 MG tablet, Take 1 tablet (10 mg total) by mouth every 6  (six) hours as needed for nausea or vomiting., Disp: 60 tablet, Rfl: 0 .  tiotropium (SPIRIVA) 18 MCG inhalation capsule, Place 1 capsule (18 mcg total) into inhaler and inhale daily., Disp: 30 capsule, Rfl: 6 .  predniSONE (DELTASONE) 20 MG tablet, Take 1 tablet (20 mg total) by mouth daily with breakfast. (Patient not taking: Reported on 10/02/2015), Disp: 30 tablet, Rfl: 0  Review of Systems Constitutional:   No  weight loss, night sweats,  Fevers, chills, fatigue, or  lassitude.  HEENT:   No headaches,  Difficulty swallowing has improved with prednisone treatment,  No Tooth/dental problems, or  Sore throat,                No sneezing, itching, ear ache, nasal congestion, + post nasal drip,   CV:  No chest pain,  Orthopnea, PND,  swelling in lower extremities,  No anasarca, dizziness, palpitations, syncope.   GI  No heartburn, indigestion, abdominal pain, nausea, vomiting, diarrhea, change in bowel habits, loss of appetite, bloody stools.   Resp: baseline  shortness of breath with exertion none at rest.  No excess mucus, no productive  cough,  No non-productive cough,  No coughing up of blood.  No change in color of mucus.  No wheezing.  No chest wall deformity.   Skin: no rash or lesions.  GU: no dysuria, change in color of urine, no urgency or frequency.  No flank pain, no hematuria   MS:  No joint pain or swelling.  No decreased range of motion.  No back pain.  Psych:  No change in mood or affect. No depression or anxiety.  No memory loss.         Objective:   Physical Exam  BP 150/80 mmHg  Pulse 88  Temp(Src) 98 F (36.7 C) (Oral)  Ht 6' (1.829 m)  Wt 231 lb 9.6 oz (105.053 kg)  BMI 31.40 kg/m2  SpO2 96%  Physical Exam:  General- No distress,  A&Ox3 ENT: No sinus tenderness, TM clear, pale nasal mucosa, no oral exudate,no post nasal drip, no LAN Cardiac: S1, S2, regular rate and rhythm, no murmur Chest: No wheeze/ rales/ dullness; no accessory muscle use, no nasal  flaring, no sternal retractions, breath sounds slightly diminished on the right. Abd.: Soft Non-tender Ext: 1+ edema bilaterally lower extremities only Neuro:  normal strength Skin: No rashes, warm and dry Psych: normal mood and behavior       Assessment & Plan:

## 2015-10-02 NOTE — Assessment & Plan Note (Signed)
Dysphagia has improved with prednisone dosing.  Plan: Continue prednisone as previously prescribed. Call for follow up as needed.

## 2015-10-02 NOTE — Assessment & Plan Note (Signed)
Blood. pressure elevated today in the office. Pt. Has new diagnosis of lymphoma and started chemo yesterday.  Plan: Re-check before leaving today. Asked patient to monitor pressures at home and call  PCP if they remain elevated.

## 2015-10-03 ENCOUNTER — Other Ambulatory Visit: Payer: Self-pay | Admitting: Internal Medicine

## 2015-10-03 ENCOUNTER — Ambulatory Visit (HOSPITAL_COMMUNITY)
Admission: RE | Admit: 2015-10-03 | Discharge: 2015-10-03 | Disposition: A | Discharge status: Home / Self Care | Payer: PPO | Source: Ambulatory Visit | Attending: Internal Medicine | Admitting: Internal Medicine

## 2015-10-03 ENCOUNTER — Telehealth: Payer: Self-pay | Admitting: *Deleted

## 2015-10-03 ENCOUNTER — Ambulatory Visit (HOSPITAL_BASED_OUTPATIENT_CLINIC_OR_DEPARTMENT_OTHER): Payer: PPO

## 2015-10-03 VITALS — BP 128/62 | HR 62 | Temp 98.6°F

## 2015-10-03 DIAGNOSIS — F329 Major depressive disorder, single episode, unspecified: Secondary | ICD-10-CM | POA: Diagnosis not present

## 2015-10-03 DIAGNOSIS — Z452 Encounter for adjustment and management of vascular access device: Secondary | ICD-10-CM | POA: Diagnosis not present

## 2015-10-03 DIAGNOSIS — D751 Secondary polycythemia: Secondary | ICD-10-CM | POA: Insufficient documentation

## 2015-10-03 DIAGNOSIS — C8318 Mantle cell lymphoma, lymph nodes of multiple sites: Secondary | ICD-10-CM

## 2015-10-03 DIAGNOSIS — I110 Hypertensive heart disease with heart failure: Secondary | ICD-10-CM | POA: Diagnosis not present

## 2015-10-03 DIAGNOSIS — I509 Heart failure, unspecified: Secondary | ICD-10-CM | POA: Diagnosis not present

## 2015-10-03 DIAGNOSIS — Z7982 Long term (current) use of aspirin: Secondary | ICD-10-CM | POA: Diagnosis not present

## 2015-10-03 DIAGNOSIS — N400 Enlarged prostate without lower urinary tract symptoms: Secondary | ICD-10-CM | POA: Diagnosis not present

## 2015-10-03 DIAGNOSIS — Z5189 Encounter for other specified aftercare: Secondary | ICD-10-CM | POA: Diagnosis not present

## 2015-10-03 DIAGNOSIS — C8510 Unspecified B-cell lymphoma, unspecified site: Secondary | ICD-10-CM | POA: Diagnosis not present

## 2015-10-03 DIAGNOSIS — E039 Hypothyroidism, unspecified: Secondary | ICD-10-CM | POA: Insufficient documentation

## 2015-10-03 DIAGNOSIS — J449 Chronic obstructive pulmonary disease, unspecified: Secondary | ICD-10-CM | POA: Insufficient documentation

## 2015-10-03 DIAGNOSIS — C8258 Diffuse follicle center lymphoma, lymph nodes of multiple sites: Secondary | ICD-10-CM

## 2015-10-03 DIAGNOSIS — C8290 Follicular lymphoma, unspecified, unspecified site: Secondary | ICD-10-CM | POA: Diagnosis not present

## 2015-10-03 DIAGNOSIS — M109 Gout, unspecified: Secondary | ICD-10-CM | POA: Diagnosis not present

## 2015-10-03 DIAGNOSIS — I272 Other secondary pulmonary hypertension: Secondary | ICD-10-CM | POA: Insufficient documentation

## 2015-10-03 LAB — CBC
HEMATOCRIT: 41 % (ref 39.0–52.0)
Hemoglobin: 12.3 g/dL — ABNORMAL LOW (ref 13.0–17.0)
MCH: 27.2 pg (ref 26.0–34.0)
MCHC: 30 g/dL (ref 30.0–36.0)
MCV: 90.5 fL (ref 78.0–100.0)
PLATELETS: 193 10*3/uL (ref 150–400)
RBC: 4.53 MIL/uL (ref 4.22–5.81)
RDW: 14.4 % (ref 11.5–15.5)
WBC: 11.2 10*3/uL — AB (ref 4.0–10.5)

## 2015-10-03 LAB — PROTIME-INR
INR: 0.99 (ref 0.00–1.49)
PROTHROMBIN TIME: 13.3 s (ref 11.6–15.2)

## 2015-10-03 LAB — BASIC METABOLIC PANEL
Anion gap: 10 (ref 5–15)
BUN: 25 mg/dL — AB (ref 6–20)
CALCIUM: 9 mg/dL (ref 8.9–10.3)
CO2: 30 mmol/L (ref 22–32)
CREATININE: 0.82 mg/dL (ref 0.61–1.24)
Chloride: 103 mmol/L (ref 101–111)
GFR calc Af Amer: >60 mL/min (ref >60)
GLUCOSE: 128 mg/dL — AB (ref 65–99)
POTASSIUM: 4.9 mmol/L (ref 3.5–5.1)
SODIUM: 143 mmol/L (ref 135–145)

## 2015-10-03 LAB — APTT: APTT: 22 s — AB (ref 24–37)

## 2015-10-03 MED ORDER — LIDOCAINE HCL 1 % IJ SOLN
INTRAMUSCULAR | Status: AC
  Filled 2015-10-03: qty 20

## 2015-10-03 MED ORDER — HEPARIN SOD (PORK) LOCK FLUSH 100 UNIT/ML IV SOLN
INTRAVENOUS | Status: AC
  Filled 2015-10-03: qty 5

## 2015-10-03 MED ORDER — LIDOCAINE-EPINEPHRINE 2 %-1:100000 IJ SOLN
INTRAMUSCULAR | Status: AC
  Filled 2015-10-03: qty 1

## 2015-10-03 MED ORDER — HEPARIN SOD (PORK) LOCK FLUSH 100 UNIT/ML IV SOLN
INTRAVENOUS | Status: DC
Start: 2015-10-03 — End: 2015-10-03
  Filled 2015-10-03: qty 5

## 2015-10-03 MED ORDER — HEPARIN SOD (PORK) LOCK FLUSH 100 UNIT/ML IV SOLN
INTRAVENOUS | Status: AC | PRN
  Administered 2015-10-03: 500 [IU] via INTRAVENOUS

## 2015-10-03 MED ORDER — MIDAZOLAM HCL 2 MG/2ML IJ SOLN
INTRAMUSCULAR | Status: AC | PRN
  Administered 2015-10-03 (×5): 1 mg via INTRAVENOUS

## 2015-10-03 MED ORDER — SODIUM CHLORIDE 0.9 % IV SOLN
Freq: Once | INTRAVENOUS | Status: AC
  Administered 2015-10-03: 12:00:00 via INTRAVENOUS

## 2015-10-03 MED ORDER — MIDAZOLAM HCL 2 MG/2ML IJ SOLN
INTRAMUSCULAR | Status: AC
Start: 2015-10-03 — End: 2015-10-03
  Filled 2015-10-03: qty 6

## 2015-10-03 MED ORDER — FENTANYL CITRATE (PF) 100 MCG/2ML IJ SOLN
INTRAMUSCULAR | Status: DC | PRN
  Administered 2015-10-03: 50 ug via INTRAVENOUS

## 2015-10-03 MED ORDER — FENTANYL CITRATE (PF) 100 MCG/2ML IJ SOLN
INTRAMUSCULAR | Status: AC | PRN
  Administered 2015-10-03: 50 ug via INTRAVENOUS

## 2015-10-03 MED ORDER — FENTANYL CITRATE (PF) 100 MCG/2ML IJ SOLN
INTRAMUSCULAR | Status: AC
  Filled 2015-10-03: qty 4

## 2015-10-03 MED ORDER — CEFAZOLIN SODIUM-DEXTROSE 2-3 GM-% IV SOLR
2.0000 g | INTRAVENOUS | Status: AC
  Administered 2015-10-03: 2 g via INTRAVENOUS

## 2015-10-03 MED ORDER — PEGFILGRASTIM INJECTION 6 MG/0.6ML ~~LOC~~
6.0000 mg | PREFILLED_SYRINGE | Freq: Once | SUBCUTANEOUS | Status: AC
  Administered 2015-10-03: 6 mg via SUBCUTANEOUS
  Filled 2015-10-03: qty 0.6

## 2015-10-03 MED ORDER — CEFAZOLIN SODIUM-DEXTROSE 2-3 GM-% IV SOLR
INTRAVENOUS | Status: AC
Start: 2015-10-03 — End: 2015-10-03
  Filled 2015-10-03: qty 50

## 2015-10-03 NOTE — Sedation Documentation (Signed)
Patient denies pain and is resting comfortably.  

## 2015-10-03 NOTE — Procedures (Signed)
Interventional Radiology Procedure Note  Procedure: Placement of a right IJ approach single lumen PowerPort.  Tip is positioned at the superior cavoatrial junction and catheter is ready for immediate use.  Complications: No immediate Recommendations:  - Ok to shower tomorrow - Do not submerge for 7 days - Routine line care   Signed,  Heath K. McCullough, MD   

## 2015-10-03 NOTE — Patient Instructions (Signed)
Pegfilgrastim injection What is this medicine? PEGFILGRASTIM (PEG fil gra stim) is a long-acting granulocyte colony-stimulating factor that stimulates the growth of neutrophils, a type of white blood cell important in the body's fight against infection. It is used to reduce the incidence of fever and infection in patients with certain types of cancer who are receiving chemotherapy that affects the bone marrow, and to increase survival after being exposed to high doses of radiation. This medicine may be used for other purposes; ask your health care provider or pharmacist if you have questions. What should I tell my health care provider before I take this medicine? They need to know if you have any of these conditions: -kidney disease -latex allergy -ongoing radiation therapy -sickle cell disease -skin reactions to acrylic adhesives (On-Body Injector only) -an unusual or allergic reaction to pegfilgrastim, filgrastim, other medicines, foods, dyes, or preservatives -pregnant or trying to get pregnant -breast-feeding How should I use this medicine? This medicine is for injection under the skin. If you get this medicine at home, you will be taught how to prepare and give the pre-filled syringe or how to use the On-body Injector. Refer to the patient Instructions for Use for detailed instructions. Use exactly as directed. Take your medicine at regular intervals. Do not take your medicine more often than directed. It is important that you put your used needles and syringes in a special sharps container. Do not put them in a trash can. If you do not have a sharps container, call your pharmacist or healthcare provider to get one. Talk to your pediatrician regarding the use of this medicine in children. While this drug may be prescribed for selected conditions, precautions do apply. Overdosage: If you think you have taken too much of this medicine contact a poison control center or emergency room at  once. NOTE: This medicine is only for you. Do not share this medicine with others. What if I miss a dose? It is important not to miss your dose. Call your doctor or health care professional if you miss your dose. If you miss a dose due to an On-body Injector failure or leakage, a new dose should be administered as soon as possible using a single prefilled syringe for manual use. What may interact with this medicine? Interactions have not been studied. Give your health care provider a list of all the medicines, herbs, non-prescription drugs, or dietary supplements you use. Also tell them if you smoke, drink alcohol, or use illegal drugs. Some items may interact with your medicine. This list may not describe all possible interactions. Give your health care provider a list of all the medicines, herbs, non-prescription drugs, or dietary supplements you use. Also tell them if you smoke, drink alcohol, or use illegal drugs. Some items may interact with your medicine. What should I watch for while using this medicine? You may need blood work done while you are taking this medicine. If you are going to need a MRI, CT scan, or other procedure, tell your doctor that you are using this medicine (On-Body Injector only). What side effects may I notice from receiving this medicine? Side effects that you should report to your doctor or health care professional as soon as possible: -allergic reactions like skin rash, itching or hives, swelling of the face, lips, or tongue -dizziness -fever -pain, redness, or irritation at site where injected -pinpoint red spots on the skin -red or dark-brown urine -shortness of breath or breathing problems -stomach or side pain, or pain   at the shoulder -swelling -tiredness -trouble passing urine or change in the amount of urine Side effects that usually do not require medical attention (report to your doctor or health care professional if they continue or are  bothersome): -bone pain -muscle pain This list may not describe all possible side effects. Call your doctor for medical advice about side effects. You may report side effects to FDA at 1-800-FDA-1088. Where should I keep my medicine? Keep out of the reach of children. Store pre-filled syringes in a refrigerator between 2 and 8 degrees C (36 and 46 degrees F). Do not freeze. Keep in carton to protect from light. Throw away this medicine if it is left out of the refrigerator for more than 48 hours. Throw away any unused medicine after the expiration date. NOTE: This sheet is a summary. It may not cover all possible information. If you have questions about this medicine, talk to your doctor, pharmacist, or health care provider.    2016, [employer]vier/[employer] Standard. (2014-09-27 14:30:14)  

## 2015-10-03 NOTE — Discharge Instructions (Signed)
Implanted Port Home Guide °An implanted port is a type of central line that is placed under the skin. Central lines are used to provide IV access when treatment or nutrition needs to be given through a person's veins. Implanted ports are used for long-term IV access. An implanted port may be placed because:  °· You need IV medicine that would be irritating to the small veins in your hands or arms.   °· You need long-term IV medicines, such as antibiotics.   °· You need IV nutrition for a long period.   °· You need frequent blood draws for lab tests.   °· You need dialysis.   °Implanted ports are usually placed in the chest area, but they can also be placed in the upper arm, the abdomen, or the leg. An implanted port has two main parts:  °· Reservoir. The reservoir is round and will appear as a small, raised area under your skin. The reservoir is the part where a needle is inserted to give medicines or draw blood.   °· Catheter. The catheter is a thin, flexible tube that extends from the reservoir. The catheter is placed into a large vein. Medicine that is inserted into the reservoir goes into the catheter and then into the vein.   °HOW WILL I CARE FOR MY INCISION SITE? °Do not get the incision site wet. Bathe or shower as directed by your health care provider.  °HOW IS MY PORT ACCESSED? °Special steps must be taken to access the port:  °· Before the port is accessed, a numbing cream can be placed on the skin. This helps numb the skin over the port site.   °· Your health care provider uses a sterile technique to access the port. °· Your health care provider must put on a mask and sterile gloves. °· The skin over your port is cleaned carefully with an antiseptic and allowed to dry. °· The port is gently pinched between sterile gloves, and a needle is inserted into the port. °· Only "non-coring" port needles should be used to access the port. Once the port is accessed, a blood return should be checked. This helps  ensure that the port is in the vein and is not clogged.   °· If your port needs to remain accessed for a constant infusion, a clear (transparent) bandage will be placed over the needle site. The bandage and needle will need to be changed every week, or as directed by your health care provider.   °· Keep the bandage covering the needle clean and dry. Do not get it wet. Follow your health care provider's instructions on how to take a shower or bath while the port is accessed.   °· If your port does not need to stay accessed, no bandage is needed over the port.   °WHAT IS FLUSHING? °Flushing helps keep the port from getting clogged. Follow your health care provider's instructions on how and when to flush the port. Ports are usually flushed with saline solution or a medicine called heparin. The need for flushing will depend on how the port is used.  °· If the port is used for intermittent medicines or blood draws, the port will need to be flushed:   °· After medicines have been given.   °· After blood has been drawn.   °· As part of routine maintenance.   °· If a constant infusion is running, the port may not need to be flushed.   °HOW LONG WILL MY PORT STAY IMPLANTED? °The port can stay in for as long as your health care   provider thinks it is needed. When it is time for the port to come out, surgery will be done to remove it. The procedure is similar to the one performed when the port was put in.  WHEN SHOULD I SEEK IMMEDIATE MEDICAL CARE? When you have an implanted port, you should seek immediate medical care if:   You notice a bad smell coming from the incision site.   You have swelling, redness, or drainage at the incision site.   You have more swelling or pain at the port site or the surrounding area.   You have a fever that is not controlled with medicine.   This information is not intended to replace advice given to you by your health care provider. Make sure you discuss any questions you have with  your health care provider.   Document Released: 09/07/2005 Document Revised: 06/28/2013 Document Reviewed: 05/15/2013 Elsevier Interactive Patient Education 2016 [place] Insertion, Care After Refer to this sheet in the next few weeks. These instructions provide you with information on caring for yourself after your procedure. Your health care provider may also give you more specific instructions. Your treatment has been planned according to current medical practices, but problems sometimes occur. Call your health care provider if you have any problems or questions after your procedure. WHAT TO EXPECT AFTER THE PROCEDURE After your procedure, it is typical to have the following:   Discomfort at the port insertion site. Ice packs to the area will help.  Bruising on the skin over the port. This will subside in 3-4 days. HOME CARE INSTRUCTIONS  After your port is placed, you will get a manufacturer's information card. The card has information about your port. Keep this card with you at all times.   Know what kind of port you have. There are many types of ports available.   Wear a medical alert bracelet in case of an emergency. This can help alert health care workers that you have a port.   The port can stay in for as long as your health care provider believes it is necessary.   A home health care nurse may give medicines and take care of the port.   You or a family member can get special training and directions for giving medicine and taking care of the port at home.  SEEK MEDICAL CARE IF:   Your port does not flush or you are unable to get a blood return.   You have a fever or chills. SEEK IMMEDIATE MEDICAL CARE IF:  You have new fluid or pus coming from your incision.   You notice a bad smell coming from your incision site.   You have swelling, pain, or more redness at the incision or port site.   You have chest pain or shortness of breath.   This  information is not intended to replace advice given to you by your health care provider. Make sure you discuss any questions you have with your health care provider.   Document Released: 06/28/2013 Document Revised: 09/12/2013 Document Reviewed: 06/28/2013 Elsevier Interactive Patient Education 2016 [employer]   Moderate Conscious Sedation, Adult, Care After Refer to this sheet in the next few weeks. These instructions provide you with information on caring for yourself after your procedure. Your health care provider may also give you more specific instructions. Your treatment has been planned according to current medical practices, but problems sometimes occur. Call your health care provider if you have any problems or questions  after your procedure. WHAT TO EXPECT AFTER THE PROCEDURE  After your procedure:  You may feel sleepy, clumsy, and have poor balance for several hours.  Vomiting may occur if you eat too soon after the procedure. HOME CARE INSTRUCTIONS  Do not participate in any activities where you could become injured for at least 24 hours. Do not:  Drive.  Swim.  Ride a bicycle.  Operate heavy machinery.  Cook.  Use power tools.  Climb ladders.  Work from a high place.  Do not make important decisions or sign legal documents until you are improved.  If you vomit, drink water, juice, or soup when you can drink without vomiting. Make sure you have little or no nausea before eating solid foods.  Only take over-the-counter or prescription medicines for pain, discomfort, or fever as directed by your health care provider.  Make sure you and your family fully understand everything about the medicines given to you, including what side effects may occur.  You should not drink alcohol, take sleeping pills, or take medicines that cause drowsiness for at least 24 hours.  If you smoke, do not smoke without supervision.  If you are feeling better, you may resume normal  activities 24 hours after you were sedated.  Keep all appointments with your health care provider. SEEK MEDICAL CARE IF:  Your skin is pale or bluish in color.  You continue to feel nauseous or vomit.  Your pain is getting worse and is not helped by medicine.  You have bleeding or swelling.  You are still sleepy or feeling clumsy after 24 hours. SEEK IMMEDIATE MEDICAL CARE IF:  You develop a rash.  You have difficulty breathing.  You develop any type of allergic problem.  You have a fever. MAKE SURE YOU:  Understand these instructions.  Will watch your condition.  Will get help right away if you are not doing well or get worse.   This information is not intended to replace advice given to you by your health care provider. Make sure you discuss any questions you have with your health care provider.   Document Released: 06/28/2013 Document Revised: 09/28/2014 Document Reviewed: 06/28/2013 Elsevier Interactive Patient Education [employer].

## 2015-10-03 NOTE — Progress Notes (Signed)
Patient ID: [name], male   DOB: Jan 26, 1951, 65 y.o.   MRN: [id number]    Referring Physician(s): Mohamed,Mohamed  Chief Complaint:  NHL  Subjective: Pt familiar to IR service from recent BM biopsy on 09/30/15 as well as left RP lymph node biopsy on 09/18/15. He has a past medical history significant for hypertension, benign prostatic hypertrophy, COPD, hypothyroid, polycythemia, depression, gout, pulmonary hypertension, congestive heart failure as well as history of stage IA non-small cell lung cancer,adenocarcinoma with squamous differentiation status post wedge resection of the left upper lobe under the care of Dr. [name] in December 2011.He has been recently diagnosed with NHL and presents again today for port a cath placement for chemotherapy. He denies fevers, chills, headache, chest pain, dyspnea, abdominal/back pain, nausea, vomiting or abnormal bleeding. He does have occasional cough.   Allergies: Review of patient's allergies indicates no known allergies.  Medications: Prior to Admission medications   Medication Sig Start Date End Date Taking? Authorizing Provider  allopurinol (ZYLOPRIM) 100 MG tablet Take 1 tablet (100 mg total) by mouth 2 (two) times daily. 09/25/15  Yes Curt Bears, MD  ALPRAZolam Duanne Moron) 0.5 MG tablet Take 0.5 mg by mouth 2 (two) times daily as needed for anxiety.   Yes Oneita Hurt, MD  amLODipine (NORVASC) 5 MG tablet Take 1 tablet by mouth Daily. 03/09/11  Yes Historical Provider, MD  aspirin 325 MG tablet Take 325 mg by mouth daily.     Yes Historical Provider, MD  clotrimazole (MYCELEX) 10 MG troche Take 1 lozenge (10 mg total) by mouth 5 (five) times daily. 09/30/15  Yes [name] Borders, NP  finasteride (PROSCAR) 5 MG tablet Take 5 mg by mouth daily.   Yes Historical Provider, MD  levothyroxine (SYNTHROID, LEVOTHROID) 25 MCG tablet Take 125 mcg by mouth daily.   Yes Historical Provider, MD  lisinopril (PRINIVIL,ZESTRIL) 10 MG tablet Take 1  tablet (10 mg total) by mouth daily. 12/29/11  Yes Thayer Headings, MD  loratadine (CLARITIN) 10 MG tablet Take 10 mg by mouth daily.   Yes Historical Provider, MD  omeprazole (PRILOSEC) 20 MG capsule Take 1 tablet by mouth Twice daily. 07/01/12  Yes Historical Provider, MD  oxyCODONE-acetaminophen (PERCOCET) 10-325 MG tablet Take 1 tablet by mouth every 4 (four) hours as needed for pain.   Yes Historical Provider, MD  predniSONE (DELTASONE) 50 MG tablet 2 tablets by mouth daily for 5 days starting with the first dose of chemotherapy every 3 weeks. 09/25/15  Yes Curt Bears, MD  tiotropium (SPIRIVA) 18 MCG inhalation capsule Place 1 capsule (18 mcg total) into inhaler and inhale daily. 08/27/15  Yes Tammy S Parrett, NP  albuterol (PROVENTIL) (2.5 MG/3ML) 0.083% nebulizer solution INHALE 3 MILLILITERS (2.5 MG) BY NEBULIZATION ROUTE EVERY 8 HOURS AS NEEDED 08/30/15   Historical Provider, MD  betamethasone dipropionate 0.05 % lotion Apply 1 drop topically 2 (two) times daily as needed.  08/26/15   Historical Provider, MD  lidocaine-prilocaine (EMLA) cream Apply 1 application topically as needed. 09/25/15   Curt Bears, MD  magic mouthwash w/lidocaine SOLN Take 5 mLs by mouth 3 (three) times daily. 09/30/15   [name] Borders, NP  predniSONE (DELTASONE) 20 MG tablet Take 1 tablet (20 mg total) by mouth daily with breakfast. Patient not taking: Reported on 10/02/2015 09/19/15   Chesley Mires, MD  prochlorperazine (COMPAZINE) 10 MG tablet Take 1 tablet (10 mg total) by mouth every 6 (six) hours as needed for nausea or vomiting. 09/25/15  Curt Bears, MD     Vital Signs: BP 140/78 mmHg  Pulse 95  Temp(Src) 97.9 F (36.6 C) (Oral)  Resp 18  Ht 6' (1.829 m)  Wt 231 lb 3.2 oz (104.872 kg)  BMI 31.35 kg/m2  SpO2 97%  Physical Exam  Constitutional: He is oriented to person, place, and time. He appears well-developed and well-nourished.  Cardiovascular: Normal rate and regular rhythm.   Pulmonary/Chest:  Effort normal.  Few bibasilar crackles  Abdominal: Soft. Bowel sounds are normal. There is no tenderness.  obese  Musculoskeletal: Normal range of motion. He exhibits edema.  Neurological: He is alert and oriented to person, place, and time.     Imaging: Ct Biopsy  09/30/2015  CLINICAL DATA:  Lymphoma EXAM: CT-GUIDED BONE MARROW ASPIRATE AND CORE. MEDICATIONS AND MEDICAL HISTORY: Versed 3 mg, Fentanyl 50 mcg. Additional Medications: None. ANESTHESIA/SEDATION: Moderate sedation time: 11 minutes PROCEDURE: The procedure, risks, benefits, and alternatives were explained to the patient. Questions regarding the procedure were encouraged and answered. The patient understands and consents to the procedure. The back was prepped with Betadine in a sterile fashion, and a sterile drape was applied covering the operative field. A sterile gown and sterile gloves were used for the procedure. Under CT guidance, an 11 gauge needle was advanced into the right iliac bone via posterior approach. Aspirates and a core were obtained. Final imaging was performed. Patient tolerated the procedure well without complication. Vital sign monitoring by nursing staff during the procedure will continue as patient is in the special procedures unit for post procedure observation. FINDINGS: The images document guide needle placement within the right iliac bone via posterior approach. Post biopsy images demonstrate no hemorrhage. COMPLICATIONS: None IMPRESSION: Successful CT-guided bone marrow aspirate and core. Electronically Signed   By: Marybelle Killings M.D.   On: 09/30/2015 10:51    Labs:  CBC:  Recent Labs  09/08/15 0957 09/08/15 1008 09/30/15 0720 09/30/15 1223 10/03/15 1204  WBC 12.5*  --  10.3 8.9 11.2*  HGB 14.0 16.7 13.7 13.3 12.3*  HCT 45.9 49.0 46.3 43.6 41.0  PLT 253  --  176 174 193    COAGS:  Recent Labs  09/12/15 1415 10/03/15 1204  INR 1.1* 0.99  APTT  --  22*    BMP:  Recent Labs  09/08/15 0957  09/08/15 1008 09/30/15 1223 10/03/15 1204  NA 136 134* 143 143  K 5.1 5.0 3.9 4.9  CL 96* 95*  --  103  CO2 28  --  32* 30  GLUCOSE 102* 101* 136 128*  BUN 11 13 16.3 25*  CALCIUM 9.3  --  9.0 9.0  CREATININE 0.76 0.70 0.9 0.82  GFRNONAA >60  --   --  >60  GFRAA >60  --   --  >60    LIVER FUNCTION TESTS:  Recent Labs  09/30/15 1223  BILITOT <0.30  AST 12  ALT 9  ALKPHOS 79  PROT 6.1*  ALBUMIN 3.3*    Assessment and Plan: Patient with history of recently diagnosed non-Hodgkin's lymphoma. Plan today is for Port-A-Cath placement for chemotherapy.Risks and benefits discussed with the patient/wife including, but not limited to bleeding, infection, pneumothorax, or fibrin sheath development and need for additional procedures.All of the patient's questions were answered, patient is agreeable to proceed.Consent signed and in chart.     Electronically Signed: D. Rowe Robert 10/03/2015, 1:19 PM   I spent a total of 15 minutes at the the patient's bedside AND on the patient's  hospital floor or unit, greater than 50% of which was counseling/coordinating care for port a cath placement

## 2015-10-03 NOTE — Telephone Encounter (Signed)
[name] here for Neulasta injection following 1st rchop chemotherapy.  States that he is doing well   No nausea, vomiting or diarrhea.  Is eating and drinking well.  Knows to call if he has any problems or concerns.

## 2015-10-08 ENCOUNTER — Other Ambulatory Visit: Payer: Self-pay | Admitting: Nurse Practitioner

## 2015-10-08 ENCOUNTER — Encounter: Payer: Self-pay | Admitting: Nurse Practitioner

## 2015-10-08 ENCOUNTER — Other Ambulatory Visit: Payer: Self-pay | Admitting: Medical Oncology

## 2015-10-08 ENCOUNTER — Other Ambulatory Visit (HOSPITAL_BASED_OUTPATIENT_CLINIC_OR_DEPARTMENT_OTHER): Payer: PPO

## 2015-10-08 ENCOUNTER — Ambulatory Visit (HOSPITAL_BASED_OUTPATIENT_CLINIC_OR_DEPARTMENT_OTHER): Payer: PPO | Admitting: Nurse Practitioner

## 2015-10-08 VITALS — BP 158/75 | HR 103 | Temp 99.3°F | Resp 18 | Ht 72.0 in | Wt 233.8 lb

## 2015-10-08 DIAGNOSIS — D709 Neutropenia, unspecified: Secondary | ICD-10-CM

## 2015-10-08 DIAGNOSIS — D702 Other drug-induced agranulocytosis: Secondary | ICD-10-CM | POA: Insufficient documentation

## 2015-10-08 DIAGNOSIS — C8518 Unspecified B-cell lymphoma, lymph nodes of multiple sites: Secondary | ICD-10-CM

## 2015-10-08 DIAGNOSIS — Z85118 Personal history of other malignant neoplasm of bronchus and lung: Secondary | ICD-10-CM

## 2015-10-08 DIAGNOSIS — D696 Thrombocytopenia, unspecified: Secondary | ICD-10-CM

## 2015-10-08 DIAGNOSIS — R509 Fever, unspecified: Secondary | ICD-10-CM

## 2015-10-08 DIAGNOSIS — C8330 Diffuse large B-cell lymphoma, unspecified site: Secondary | ICD-10-CM

## 2015-10-08 DIAGNOSIS — C8258 Diffuse follicle center lymphoma, lymph nodes of multiple sites: Secondary | ICD-10-CM

## 2015-10-08 DIAGNOSIS — K219 Gastro-esophageal reflux disease without esophagitis: Secondary | ICD-10-CM

## 2015-10-08 DIAGNOSIS — J189 Pneumonia, unspecified organism: Secondary | ICD-10-CM

## 2015-10-08 DIAGNOSIS — Z5111 Encounter for antineoplastic chemotherapy: Secondary | ICD-10-CM | POA: Insufficient documentation

## 2015-10-08 HISTORY — DX: Encounter for antineoplastic chemotherapy: Z51.11

## 2015-10-08 HISTORY — DX: Other drug-induced agranulocytosis: D70.2

## 2015-10-08 LAB — COMPREHENSIVE METABOLIC PANEL
ALBUMIN: 3.1 g/dL — AB (ref 3.5–5.0)
ALK PHOS: 83 U/L (ref 40–150)
Anion Gap: 8 mEq/L (ref 3–11)
BUN: 20.5 mg/dL (ref 7.0–26.0)
CHLORIDE: 101 meq/L (ref 98–109)
CO2: 30 mEq/L — ABNORMAL HIGH (ref 22–29)
CREATININE: 0.9 mg/dL (ref 0.7–1.3)
Calcium: 9.1 mg/dL (ref 8.4–10.4)
EGFR: >90 mL/min/{1.73_m2} (ref >90)
GLUCOSE: 145 mg/dL — AB (ref 70–140)
POTASSIUM: 4.8 meq/L (ref 3.5–5.1)
SODIUM: 138 meq/L (ref 136–145)
Total Bilirubin: 0.57 mg/dL (ref 0.20–1.20)
Total Protein: 6.1 g/dL — ABNORMAL LOW (ref 6.4–8.3)

## 2015-10-08 LAB — CBC WITH DIFFERENTIAL/PLATELET
BASO%: 0.5 % (ref 0.0–2.0)
BASOS ABS: 0 10*3/uL (ref 0.0–0.1)
EOS%: 14.8 % — AB (ref 0.0–7.0)
Eosinophils Absolute: 0.3 10*3/uL (ref 0.0–0.5)
HEMATOCRIT: 35.8 % — AB (ref 38.4–49.9)
HEMOGLOBIN: 11.2 g/dL — AB (ref 13.0–17.1)
LYMPH#: 0.3 10*3/uL — AB (ref 0.9–3.3)
LYMPH%: 15.3 % (ref 14.0–49.0)
MCH: 27.4 pg (ref 27.2–33.4)
MCHC: 31.3 g/dL — ABNORMAL LOW (ref 32.0–36.0)
MCV: 87.5 fL (ref 79.3–98.0)
MONO#: 0.3 10*3/uL (ref 0.1–0.9)
MONO%: 14.3 % — AB (ref 0.0–14.0)
NEUT#: 1 10*3/uL — ABNORMAL LOW (ref 1.5–6.5)
NEUT%: 55.1 % (ref 39.0–75.0)
Platelets: 55 10*3/uL — ABNORMAL LOW (ref 140–400)
RBC: 4.09 10*6/uL — ABNORMAL LOW (ref 4.20–5.82)
RDW: 14.6 % (ref 11.0–14.6)
WBC: 1.9 10*3/uL — AB (ref 4.0–10.3)

## 2015-10-08 MED ORDER — MAGIC MOUTHWASH W/LIDOCAINE: 5.0000 mL | Freq: Three times a day (TID) | ORAL | Status: DC

## 2015-10-08 NOTE — Progress Notes (Addendum)
[place] OFFICE PROGRESS NOTE   Diagnosis:  Non-Hodgkin's lymphoma  INTERVAL HISTORY:   Mr. Walston returns as scheduled. He completed cycle 1 CHOP/Rituxan 10/01/2015 with Neulasta support. He denies nausea/vomiting. His mouth feels sore. No diarrhea or constipation. He is taking Miralax as a preventative measure against constipation. Several days ago he had an episode of severe acid reflux with coughing. He is concerned because in the past he has developed pneumonia after similar episodes. He reports a low-grade fever. Maximum temperature 100.3 degrees. No shaking chills. He denies shortness of breath. No significant cough. He takes Prilosec twice a day. He has a prescription on hand for Augmentin and wonders if he should begin this.  Objective:  Vital signs in last 24 hours:  Blood pressure 158/75, pulse 103, temperature 99.3 F (37.4 C), temperature source Oral, resp. rate 18, height 6' (1.829 m), weight 233 lb 12.8 oz (106.051 kg), SpO2 97 %.    HEENT:  Mucous membranes are moist. No ulcerations. Lymphatics:  No palpable cervical , supraclavicular or axillary lymph nodes. Resp:  Rhonchi at the lower lung fields bilaterally. No respiratory distress. Cardio:  Regular rate and rhythm. GI:  Abdomen soft and nontender. No hepatomegaly. Vascular:  No leg edema. Portacath without erythema.  Lab Results:  Lab Results  Component Value Date   WBC 1.9* 10/08/2015   HGB 11.2* 10/08/2015   HCT 35.8* 10/08/2015   MCV 87.5 10/08/2015   PLT 55* 10/08/2015   NEUTROABS 1.0* 10/08/2015    Imaging:  No results found.  Medications: I have reviewed the patient's current medications.  Assessment/Plan: 1. Stage III large B-cell non-Hodgkin lymphoma presenting with extensive lymphadenopathy involving the neck, chest, abdomen as well as splenomegaly diagnosed December 2016; status post cycle 1 CHOP/Rituxan  10/01/2015 with Neulasta support. 2.  Port-A-Cath placement  10/03/2015 3.  Neutropenia and thrombocytopenia following cycle 1 CHOP/Rituxan. 4.  History of stage IA non-small cell lung cancer adenocarcinoma squamous differentiation status post wedge resection left upper lobe December 2011   Disposition: Mr. Mahaney completed cycle 1 CHOP/Rituxan 10/01/2015 with Neulasta support. He has neutropenia and thrombocytopenia on labs today. We reviewed precautions for both. At present he is having some low-grade fevers. He has a prescription on hand for Augmentin. We recommended that he begin taking as prescribed. He understands to contact the office with persistent fever, shaking chills, onset of other worrisome symptoms.  He will return for a follow-up lab visit in one week. We will see him in follow-up in 2 weeks which will be prior to cycle 2 CHOP/Rituxan.  Patient seen with Dr. Julien Nordmann.  Ned Card ANP/GNP-BC   10/08/2015  3:02 PM  ADDENDUM: Hematology/Oncology Attending: I had a face to face encounter with the patient today. I recommended his care plan. This is a very pleasant [age] years old white male recently diagnosed with large B-cell non-Hodgkin lymphoma currently undergoing systemic chemotherapy with CHOP/Rituxan status post 1 cycle of treatment with Neulasta support. He tolerated the first week of his treatment fairly well with no significant nausea or vomiting. Has some acid reflux and currently on treatment with Prilosec once daily. He also has low-grade fever at home. He has prescription for Augmentin by Dr. Chase Caller. I recommended for the patient to start the treatment with Augmentin for now especially with the declining absolute neutrophil count. We will continue to monitor him closely and the patient was given neutropenic precaution today. He would come back for follow-up visit in 2 weeks for reevaluation before  starting cycle #2. He was advised to call immediately if he has any concerning symptoms in the interval.  Disclaimer: This note was  dictated with voice recognition software. Similar sounding words can inadvertently be transcribed and may be missed upon review. Eilleen Kempf., MD 10/08/2015

## 2015-10-09 ENCOUNTER — Telehealth: Payer: Self-pay

## 2015-10-09 LAB — CHROMOSOME ANALYSIS, BONE MARROW

## 2015-10-09 NOTE — Telephone Encounter (Signed)
Received a call from [place] at [facility] requesting if they could use the maximum strength of maalox for the Magic Mouth wash prescription. Spoke with Dr. Julien Nordmann stated this was ok.

## 2015-10-10 ENCOUNTER — Telehealth: Payer: Self-pay | Admitting: *Deleted

## 2015-10-10 ENCOUNTER — Telehealth: Payer: Self-pay | Admitting: Nurse Practitioner

## 2015-10-10 NOTE — Telephone Encounter (Signed)
Spouse Wells Guiles called requesting "Is this normal?  He saw Lattie Haw with a low grade temp on Tuesday.  He still has low grade temp.  Has started antibiotic.  His mouth has sores and blisters to tongue." Denies shaking chills and "temp has ranged from 99 to 100.1.  Told not to take anything for fever."  Denies any signs of infection.  Advised on neutropenic precautions, to complete antibiotic, FF and call for temp > 100.5.  If after hours with fever and chills to report to ED.

## 2015-10-10 NOTE — Telephone Encounter (Signed)
Spoke with patient and he is aware of his added lisa appt 1/31

## 2015-10-15 ENCOUNTER — Other Ambulatory Visit: Payer: Self-pay | Admitting: Medical Oncology

## 2015-10-15 ENCOUNTER — Other Ambulatory Visit (HOSPITAL_BASED_OUTPATIENT_CLINIC_OR_DEPARTMENT_OTHER): Payer: PPO

## 2015-10-15 DIAGNOSIS — C3492 Malignant neoplasm of unspecified part of left bronchus or lung: Secondary | ICD-10-CM

## 2015-10-15 LAB — CBC WITH DIFFERENTIAL/PLATELET
BASO%: 1.7 % (ref 0.0–2.0)
BASOS ABS: 0.4 10*3/uL — AB (ref 0.0–0.1)
EOS%: 0.6 % (ref 0.0–7.0)
Eosinophils Absolute: 0.1 10*3/uL (ref 0.0–0.5)
HCT: 36 % — ABNORMAL LOW (ref 38.4–49.9)
HGB: 11.1 g/dL — ABNORMAL LOW (ref 13.0–17.1)
LYMPH%: 2.9 % — ABNORMAL LOW (ref 14.0–49.0)
MCH: 27.2 pg (ref 27.2–33.4)
MCHC: 30.8 g/dL — ABNORMAL LOW (ref 32.0–36.0)
MCV: 88.2 fL (ref 79.3–98.0)
MONO#: 1 10*3/uL — ABNORMAL HIGH (ref 0.1–0.9)
MONO%: 3.9 % (ref 0.0–14.0)
NEUT#: 23 10*3/uL — ABNORMAL HIGH (ref 1.5–6.5)
NEUT%: 90.9 % — ABNORMAL HIGH (ref 39.0–75.0)
NRBC: 1 % — AB (ref 0–0)
Platelets: 181 10*3/uL (ref 140–400)
RBC: 4.08 10*6/uL — AB (ref 4.20–5.82)
RDW: 15.7 % — AB (ref 11.0–14.6)
WBC: 25.3 10*3/uL — ABNORMAL HIGH (ref 4.0–10.3)
lymph#: 0.7 10*3/uL — ABNORMAL LOW (ref 0.9–3.3)

## 2015-10-15 LAB — COMPREHENSIVE METABOLIC PANEL
ALT: 10 U/L (ref 0–55)
AST: 16 U/L (ref 5–34)
Albumin: 3.4 g/dL — ABNORMAL LOW (ref 3.5–5.0)
Alkaline Phosphatase: 124 U/L (ref 40–150)
Anion Gap: 7 mEq/L (ref 3–11)
BUN: 15.2 mg/dL (ref 7.0–26.0)
CO2: 34 meq/L — AB (ref 22–29)
Calcium: 9.3 mg/dL (ref 8.4–10.4)
Chloride: 98 mEq/L (ref 98–109)
Creatinine: 1 mg/dL (ref 0.7–1.3)
EGFR: 84 mL/min/{1.73_m2} — AB (ref >90)
GLUCOSE: 151 mg/dL — AB (ref 70–140)
POTASSIUM: 6 meq/L — AB (ref 3.5–5.1)
SODIUM: 139 meq/L (ref 136–145)
TOTAL PROTEIN: 6.5 g/dL (ref 6.4–8.3)
Total Bilirubin: 0.32 mg/dL (ref 0.20–1.20)

## 2015-10-16 ENCOUNTER — Other Ambulatory Visit: Payer: Self-pay | Admitting: Internal Medicine

## 2015-10-16 DIAGNOSIS — C8338 Diffuse large B-cell lymphoma, lymph nodes of multiple sites: Secondary | ICD-10-CM

## 2015-10-21 DIAGNOSIS — G894 Chronic pain syndrome: Secondary | ICD-10-CM | POA: Diagnosis not present

## 2015-10-21 DIAGNOSIS — F419 Anxiety disorder, unspecified: Secondary | ICD-10-CM | POA: Diagnosis not present

## 2015-10-21 DIAGNOSIS — I10 Essential (primary) hypertension: Secondary | ICD-10-CM | POA: Diagnosis not present

## 2015-10-22 ENCOUNTER — Telehealth: Payer: Self-pay | Admitting: Internal Medicine

## 2015-10-22 ENCOUNTER — Ambulatory Visit (HOSPITAL_BASED_OUTPATIENT_CLINIC_OR_DEPARTMENT_OTHER): Payer: PPO

## 2015-10-22 ENCOUNTER — Other Ambulatory Visit: Payer: Self-pay | Admitting: Internal Medicine

## 2015-10-22 ENCOUNTER — Other Ambulatory Visit (HOSPITAL_BASED_OUTPATIENT_CLINIC_OR_DEPARTMENT_OTHER): Payer: PPO

## 2015-10-22 ENCOUNTER — Encounter: Payer: Self-pay | Admitting: Pharmacist

## 2015-10-22 ENCOUNTER — Ambulatory Visit: Payer: PPO

## 2015-10-22 ENCOUNTER — Ambulatory Visit (HOSPITAL_BASED_OUTPATIENT_CLINIC_OR_DEPARTMENT_OTHER): Payer: PPO | Admitting: Nurse Practitioner

## 2015-10-22 VITALS — BP 142/72 | HR 86 | Temp 98.1°F | Resp 17 | Ht 72.0 in | Wt 225.9 lb

## 2015-10-22 VITALS — BP 109/55 | HR 65 | Temp 98.2°F | Resp 18

## 2015-10-22 DIAGNOSIS — D701 Agranulocytosis secondary to cancer chemotherapy: Secondary | ICD-10-CM | POA: Diagnosis not present

## 2015-10-22 DIAGNOSIS — C8258 Diffuse follicle center lymphoma, lymph nodes of multiple sites: Secondary | ICD-10-CM

## 2015-10-22 DIAGNOSIS — Z5112 Encounter for antineoplastic immunotherapy: Secondary | ICD-10-CM | POA: Diagnosis not present

## 2015-10-22 DIAGNOSIS — C8518 Unspecified B-cell lymphoma, lymph nodes of multiple sites: Secondary | ICD-10-CM

## 2015-10-22 DIAGNOSIS — Z85118 Personal history of other malignant neoplasm of bronchus and lung: Secondary | ICD-10-CM

## 2015-10-22 DIAGNOSIS — Z5111 Encounter for antineoplastic chemotherapy: Secondary | ICD-10-CM

## 2015-10-22 DIAGNOSIS — D6959 Other secondary thrombocytopenia: Secondary | ICD-10-CM | POA: Diagnosis not present

## 2015-10-22 DIAGNOSIS — E875 Hyperkalemia: Secondary | ICD-10-CM

## 2015-10-22 DIAGNOSIS — C8338 Diffuse large B-cell lymphoma, lymph nodes of multiple sites: Secondary | ICD-10-CM

## 2015-10-22 DIAGNOSIS — Z95828 Presence of other vascular implants and grafts: Secondary | ICD-10-CM

## 2015-10-22 LAB — CBC WITH DIFFERENTIAL/PLATELET
BASO%: 1.1 % (ref 0.0–2.0)
BASOS ABS: 0.3 10*3/uL — AB (ref 0.0–0.1)
EOS ABS: 0.1 10*3/uL (ref 0.0–0.5)
EOS%: 0.2 % (ref 0.0–7.0)
HCT: 35 % — ABNORMAL LOW (ref 38.4–49.9)
HGB: 10.8 g/dL — ABNORMAL LOW (ref 13.0–17.1)
LYMPH%: 3 % — AB (ref 14.0–49.0)
MCH: 27.6 pg (ref 27.2–33.4)
MCHC: 30.9 g/dL — AB (ref 32.0–36.0)
MCV: 89.5 fL (ref 79.3–98.0)
MONO#: 0.9 10*3/uL (ref 0.1–0.9)
MONO%: 3.7 % (ref 0.0–14.0)
NEUT#: 21 10*3/uL — ABNORMAL HIGH (ref 1.5–6.5)
NEUT%: 92 % — AB (ref 39.0–75.0)
PLATELETS: 266 10*3/uL (ref 140–400)
RBC: 3.91 10*6/uL — AB (ref 4.20–5.82)
RDW: 17.4 % — ABNORMAL HIGH (ref 11.0–14.6)
WBC: 22.9 10*3/uL — ABNORMAL HIGH (ref 4.0–10.3)
lymph#: 0.7 10*3/uL — ABNORMAL LOW (ref 0.9–3.3)
nRBC: 0 % (ref 0–0)

## 2015-10-22 LAB — COMPREHENSIVE METABOLIC PANEL
ALK PHOS: 105 U/L (ref 40–150)
ALT: 13 U/L (ref 0–55)
ANION GAP: 8 meq/L (ref 3–11)
AST: 14 U/L (ref 5–34)
Albumin: 3.2 g/dL — ABNORMAL LOW (ref 3.5–5.0)
BUN: 24.6 mg/dL (ref 7.0–26.0)
CALCIUM: 9.3 mg/dL (ref 8.4–10.4)
CHLORIDE: 100 meq/L (ref 98–109)
CO2: 30 meq/L — AB (ref 22–29)
CREATININE: 1 mg/dL (ref 0.7–1.3)
EGFR: 81 mL/min/{1.73_m2} — AB (ref >90)
Glucose: 130 mg/dl (ref 70–140)
Potassium: 5.7 mEq/L — ABNORMAL HIGH (ref 3.5–5.1)
Sodium: 138 mEq/L (ref 136–145)
Total Bilirubin: 0.32 mg/dL (ref 0.20–1.20)
Total Protein: 6.7 g/dL (ref 6.4–8.3)

## 2015-10-22 MED ORDER — SODIUM CHLORIDE 0.9 % IV SOLN
Freq: Once | INTRAVENOUS | Status: AC
  Administered 2015-10-22: 11:00:00 via INTRAVENOUS

## 2015-10-22 MED ORDER — ACETAMINOPHEN 325 MG PO TABS
ORAL_TABLET | ORAL | Status: AC
Start: 2015-10-22 — End: 2015-10-22
  Filled 2015-10-22: qty 2

## 2015-10-22 MED ORDER — VINCRISTINE SULFATE CHEMO INJECTION 1 MG/ML
2.0000 mg | Freq: Once | INTRAVENOUS | Status: AC
  Administered 2015-10-22: 2 mg via INTRAVENOUS
  Filled 2015-10-22: qty 2

## 2015-10-22 MED ORDER — ACETAMINOPHEN 325 MG PO TABS
650.0000 mg | ORAL_TABLET | Freq: Once | ORAL | Status: AC
  Administered 2015-10-22: 650 mg via ORAL

## 2015-10-22 MED ORDER — HEPARIN SOD (PORK) LOCK FLUSH 100 UNIT/ML IV SOLN
500.0000 [IU] | Freq: Once | INTRAVENOUS | Status: DC | PRN
  Filled 2015-10-22: qty 5

## 2015-10-22 MED ORDER — SODIUM CHLORIDE 0.9% FLUSH
10.0000 mL | INTRAVENOUS | Status: DC | PRN
  Administered 2015-10-22: 10 mL via INTRAVENOUS
  Filled 2015-10-22: qty 10

## 2015-10-22 MED ORDER — SODIUM CHLORIDE 0.9 % IV SOLN
Freq: Once | INTRAVENOUS | Status: AC
  Administered 2015-10-22: 11:00:00 via INTRAVENOUS
  Filled 2015-10-22: qty 8

## 2015-10-22 MED ORDER — SODIUM CHLORIDE 0.9 % IJ SOLN
10.0000 mL | INTRAMUSCULAR | Status: DC | PRN
  Filled 2015-10-22: qty 10

## 2015-10-22 MED ORDER — DIPHENHYDRAMINE HCL 25 MG PO CAPS
ORAL_CAPSULE | ORAL | Status: AC
  Filled 2015-10-22: qty 2

## 2015-10-22 MED ORDER — DOXORUBICIN HCL CHEMO IV INJECTION 2 MG/ML
50.0000 mg/m2 | Freq: Once | INTRAVENOUS | Status: AC
  Administered 2015-10-22: 112 mg via INTRAVENOUS
  Filled 2015-10-22: qty 56

## 2015-10-22 MED ORDER — DIPHENHYDRAMINE HCL 25 MG PO CAPS
50.0000 mg | ORAL_CAPSULE | Freq: Once | ORAL | Status: AC
  Administered 2015-10-22: 50 mg via ORAL

## 2015-10-22 MED ORDER — CYCLOPHOSPHAMIDE CHEMO INJECTION 1 GM
750.0000 mg/m2 | Freq: Once | INTRAMUSCULAR | Status: AC
  Administered 2015-10-22: 1680 mg via INTRAVENOUS
  Filled 2015-10-22: qty 84

## 2015-10-22 MED ORDER — SODIUM CHLORIDE 0.9 % IV SOLN
375.0000 mg/m2 | Freq: Once | INTRAVENOUS | Status: AC
  Administered 2015-10-22: 900 mg via INTRAVENOUS
  Filled 2015-10-22: qty 90

## 2015-10-22 NOTE — Telephone Encounter (Signed)
Talked with and scheduled this patient’s appointment(s) while patient was here in our office.       [facility]. °

## 2015-10-22 NOTE — Progress Notes (Signed)
  [name] OFFICE PROGRESS NOTE   Diagnosis:  Non-Hodgkin's lymphoma  INTERVAL HISTORY:   [name] returns as scheduled. He completed cycle 1 CHOP/Rituxan 10/01/2015 with Neulasta support. He denies nausea/vomiting. Mouth is no longer sore. No diarrhea or constipation. Fevers have resolved. He feels less short of breath. No cough. Reflux symptoms are controlled with Prilosec twice a day. No numbness or tingling in his hands or feet. He has a good appetite.  Objective:  Vital signs in last 24 hours:  Blood pressure 142/72, pulse 86, temperature 98.1 F (36.7 C), temperature source Oral, resp. rate 17, height 6' (1.829 m), weight 225 lb 14.4 oz (102.468 kg), SpO2 85 %.    HEENT: No thrush or ulcers. Lymphatics: No palpable cervical or supra-clavicular lymph nodes. Resp: Lungs with scattered rhonchi. No respiratory distress. Cardio: Regular rate and rhythm. GI: Abdomen soft and nontender. No organomegaly. Vascular: Trace bilateral pretibial edema. Skin: No rash. Port-A-Cath without erythema.    Lab Results:  Lab Results  Component Value Date   WBC 22.9* 10/22/2015   HGB 10.8* 10/22/2015   HCT 35.0* 10/22/2015   MCV 89.5 10/22/2015   PLT 266 10/22/2015   NEUTROABS 21.0* 10/22/2015    Imaging:  No results found.  Medications: I have reviewed the patient's current medications.  Assessment/Plan: 1. Stage III large B-cell non-Hodgkin lymphoma presenting with extensive lymphadenopathy involving the neck, chest, abdomen as well as splenomegaly diagnosed December 2016; status post cycle 1 CHOP/Rituxan 10/01/2015 with Neulasta support. 2. Port-A-Cath placement 10/03/2015 3. Neutropenia and thrombocytopenia following cycle 1 CHOP/Rituxan. 4. History of stage IA non-small cell lung cancer adenocarcinoma squamous differentiation status post wedge resection left upper lobe December 2011 5. Hyperkalemia 10/15/2015 and 10/22/2015. Labs forwarded to PCP. Question  if blood pressure medication should be changed, currently on lisinopril.   Disposition: [name] appears stable. He has completed 1 cycle of CHOP/Rituxan. Plan to proceed with cycle 2 today as scheduled.   The white count is elevated. We will hold Neulasta with this cycle.  He was hyperkalemic on labs 10/15/2015 and again today. We are forwarding the labs to his PCP with question of changing current antihypertensive.  He will continue weekly labs.  He will return for a follow-up visit and cycle 3 CHOP/Rituxan in 2 weeks. He will contact the office in the interim with any problems.  Plan reviewed with Dr. Julien Nordmann.    Ned Card ANP/GNP-BC   10/22/2015  9:46 AM

## 2015-10-22 NOTE — Patient Instructions (Signed)

## 2015-10-22 NOTE — Patient Instructions (Signed)
[facility] Discharge Instructions for Patients Receiving Chemotherapy  Today you received the following chemotherapy agents rituxan/adriamycin/vincristine/cytoxan.   To help prevent nausea and vomiting after your treatment, we encourage you to take your nausea medication as directed.    If you develop nausea and vomiting that is not controlled by your nausea medication, call the clinic.   BELOW ARE SYMPTOMS THAT SHOULD BE REPORTED IMMEDIATELY:  *FEVER GREATER THAN 100.5 F  *CHILLS WITH OR WITHOUT FEVER  NAUSEA AND VOMITING THAT IS NOT CONTROLLED WITH YOUR NAUSEA MEDICATION  *UNUSUAL SHORTNESS OF BREATH  *UNUSUAL BRUISING OR BLEEDING  TENDERNESS IN MOUTH AND THROAT WITH OR WITHOUT PRESENCE OF ULCERS  *URINARY PROBLEMS  *BOWEL PROBLEMS  UNUSUAL RASH Items with * indicate a potential emergency and should be followed up as soon as possible.  Feel free to call the clinic you have any questions or concerns. The clinic phone number is [phone].  

## 2015-10-23 NOTE — Telephone Encounter (Signed)
F/U visit on 10-22-2015.

## 2015-10-24 ENCOUNTER — Ambulatory Visit: Payer: PPO

## 2015-10-27 DIAGNOSIS — J449 Chronic obstructive pulmonary disease, unspecified: Secondary | ICD-10-CM | POA: Diagnosis not present

## 2015-10-29 ENCOUNTER — Other Ambulatory Visit (HOSPITAL_BASED_OUTPATIENT_CLINIC_OR_DEPARTMENT_OTHER): Payer: PPO

## 2015-10-29 DIAGNOSIS — C8338 Diffuse large B-cell lymphoma, lymph nodes of multiple sites: Secondary | ICD-10-CM | POA: Diagnosis not present

## 2015-10-29 LAB — COMPREHENSIVE METABOLIC PANEL
ALBUMIN: 3.2 g/dL — AB (ref 3.5–5.0)
ALT: 11 U/L (ref 0–55)
ANION GAP: 8 meq/L (ref 3–11)
AST: 8 U/L (ref 5–34)
Alkaline Phosphatase: 70 U/L (ref 40–150)
BILIRUBIN TOTAL: 0.48 mg/dL (ref 0.20–1.20)
BUN: 19.4 mg/dL (ref 7.0–26.0)
CALCIUM: 9 mg/dL (ref 8.4–10.4)
CHLORIDE: 104 meq/L (ref 98–109)
CO2: 30 meq/L — AB (ref 22–29)
CREATININE: 0.8 mg/dL (ref 0.7–1.3)
EGFR: >90 mL/min/{1.73_m2} (ref >90)
GLUCOSE: 160 mg/dL — AB (ref 70–140)
Potassium: 4.9 mEq/L (ref 3.5–5.1)
Sodium: 142 mEq/L (ref 136–145)
Total Protein: 6.1 g/dL — ABNORMAL LOW (ref 6.4–8.3)

## 2015-10-29 LAB — CBC WITH DIFFERENTIAL/PLATELET
BASO%: 0.5 % (ref 0.0–2.0)
BASOS ABS: 0 10*3/uL (ref 0.0–0.1)
EOS ABS: 0.1 10*3/uL (ref 0.0–0.5)
EOS%: 0.7 % (ref 0.0–7.0)
HEMATOCRIT: 30.8 % — AB (ref 38.4–49.9)
HEMOGLOBIN: 9.9 g/dL — AB (ref 13.0–17.1)
LYMPH#: 0.2 10*3/uL — AB (ref 0.9–3.3)
LYMPH%: 3.2 % — ABNORMAL LOW (ref 14.0–49.0)
MCH: 27.4 pg (ref 27.2–33.4)
MCHC: 32 g/dL (ref 32.0–36.0)
MCV: 85.4 fL (ref 79.3–98.0)
MONO#: 0.1 10*3/uL (ref 0.1–0.9)
MONO%: 1.3 % (ref 0.0–14.0)
NEUT#: 6.6 10*3/uL — ABNORMAL HIGH (ref 1.5–6.5)
NEUT%: 94.3 % — ABNORMAL HIGH (ref 39.0–75.0)
PLATELETS: 251 10*3/uL (ref 140–400)
RBC: 3.61 10*6/uL — ABNORMAL LOW (ref 4.20–5.82)
RDW: 17.5 % — AB (ref 11.0–14.6)
WBC: 7 10*3/uL (ref 4.0–10.3)

## 2015-10-30 ENCOUNTER — Telehealth: Payer: Self-pay | Admitting: Internal Medicine

## 2015-10-30 NOTE — Telephone Encounter (Signed)
addded f/u to 3/14 appointments - start time remains the same. Patient to get new schedule at 2/21 visit.

## 2015-11-05 ENCOUNTER — Other Ambulatory Visit (HOSPITAL_BASED_OUTPATIENT_CLINIC_OR_DEPARTMENT_OTHER): Payer: PPO

## 2015-11-05 DIAGNOSIS — C8338 Diffuse large B-cell lymphoma, lymph nodes of multiple sites: Secondary | ICD-10-CM | POA: Diagnosis not present

## 2015-11-05 LAB — CBC WITH DIFFERENTIAL/PLATELET
BASO%: 2.3 % — AB (ref 0.0–2.0)
BASOS ABS: 0 10*3/uL (ref 0.0–0.1)
EOS%: 3.8 % (ref 0.0–7.0)
Eosinophils Absolute: 0.1 10*3/uL (ref 0.0–0.5)
HEMATOCRIT: 30.4 % — AB (ref 38.4–49.9)
HEMOGLOBIN: 9.7 g/dL — AB (ref 13.0–17.1)
LYMPH#: 0.3 10*3/uL — AB (ref 0.9–3.3)
LYMPH%: 11.8 % — ABNORMAL LOW (ref 14.0–49.0)
MCH: 28 pg (ref 27.2–33.4)
MCHC: 32 g/dL (ref 32.0–36.0)
MCV: 87.7 fL (ref 79.3–98.0)
MONO#: 0.6 10*3/uL (ref 0.1–0.9)
MONO%: 27.5 % — AB (ref 0.0–14.0)
NEUT%: 54.6 % (ref 39.0–75.0)
NEUTROS ABS: 1.2 10*3/uL — AB (ref 1.5–6.5)
Platelets: 250 10*3/uL (ref 140–400)
RBC: 3.47 10*6/uL — ABNORMAL LOW (ref 4.20–5.82)
RDW: 21 % — AB (ref 11.0–14.6)
WBC: 2.1 10*3/uL — AB (ref 4.0–10.3)

## 2015-11-05 LAB — COMPREHENSIVE METABOLIC PANEL
ALBUMIN: 3.4 g/dL — AB (ref 3.5–5.0)
ALK PHOS: 76 U/L (ref 40–150)
ALT: 10 U/L (ref 0–55)
AST: 8 U/L (ref 5–34)
Anion Gap: 8 mEq/L (ref 3–11)
BUN: 15.6 mg/dL (ref 7.0–26.0)
CALCIUM: 9.3 mg/dL (ref 8.4–10.4)
CO2: 34 mEq/L — ABNORMAL HIGH (ref 22–29)
CREATININE: 1 mg/dL (ref 0.7–1.3)
Chloride: 101 mEq/L (ref 98–109)
EGFR: 83 mL/min/{1.73_m2} — ABNORMAL LOW (ref >90)
GLUCOSE: 129 mg/dL (ref 70–140)
POTASSIUM: 5 meq/L (ref 3.5–5.1)
SODIUM: 143 meq/L (ref 136–145)
TOTAL PROTEIN: 6.3 g/dL — AB (ref 6.4–8.3)
Total Bilirubin: 0.45 mg/dL (ref 0.20–1.20)

## 2015-11-05 LAB — TECHNOLOGIST REVIEW

## 2015-11-11 ENCOUNTER — Encounter (HOSPITAL_COMMUNITY): Payer: Self-pay

## 2015-11-12 ENCOUNTER — Ambulatory Visit (HOSPITAL_BASED_OUTPATIENT_CLINIC_OR_DEPARTMENT_OTHER): Payer: PPO

## 2015-11-12 ENCOUNTER — Encounter: Payer: Self-pay | Admitting: Internal Medicine

## 2015-11-12 ENCOUNTER — Other Ambulatory Visit (HOSPITAL_BASED_OUTPATIENT_CLINIC_OR_DEPARTMENT_OTHER): Payer: PPO

## 2015-11-12 ENCOUNTER — Other Ambulatory Visit: Payer: PPO

## 2015-11-12 ENCOUNTER — Telehealth: Payer: Self-pay | Admitting: Internal Medicine

## 2015-11-12 ENCOUNTER — Telehealth: Payer: Self-pay | Admitting: *Deleted

## 2015-11-12 ENCOUNTER — Ambulatory Visit (HOSPITAL_BASED_OUTPATIENT_CLINIC_OR_DEPARTMENT_OTHER): Payer: PPO | Admitting: Internal Medicine

## 2015-11-12 VITALS — BP 148/69 | HR 89 | Temp 98.7°F

## 2015-11-12 VITALS — BP 170/75 | HR 93 | Temp 98.6°F | Resp 18 | Ht 72.0 in | Wt 230.0 lb

## 2015-11-12 DIAGNOSIS — Z5111 Encounter for antineoplastic chemotherapy: Secondary | ICD-10-CM

## 2015-11-12 DIAGNOSIS — C8258 Diffuse follicle center lymphoma, lymph nodes of multiple sites: Secondary | ICD-10-CM

## 2015-11-12 DIAGNOSIS — Z5112 Encounter for antineoplastic immunotherapy: Secondary | ICD-10-CM | POA: Diagnosis not present

## 2015-11-12 DIAGNOSIS — C8338 Diffuse large B-cell lymphoma, lymph nodes of multiple sites: Secondary | ICD-10-CM | POA: Diagnosis not present

## 2015-11-12 LAB — CBC WITH DIFFERENTIAL/PLATELET
BASO%: 0.8 % (ref 0.0–2.0)
BASOS ABS: 0.1 10*3/uL (ref 0.0–0.1)
EOS ABS: 0.2 10*3/uL (ref 0.0–0.5)
EOS%: 1.4 % (ref 0.0–7.0)
HCT: 33.4 % — ABNORMAL LOW (ref 38.4–49.9)
HEMOGLOBIN: 10.6 g/dL — AB (ref 13.0–17.1)
LYMPH%: 4.6 % — AB (ref 14.0–49.0)
MCH: 28.1 pg (ref 27.2–33.4)
MCHC: 31.9 g/dL — ABNORMAL LOW (ref 32.0–36.0)
MCV: 88.2 fL (ref 79.3–98.0)
MONO#: 1.2 10*3/uL — AB (ref 0.1–0.9)
MONO%: 7.8 % (ref 0.0–14.0)
NEUT%: 85.4 % — ABNORMAL HIGH (ref 39.0–75.0)
NEUTROS ABS: 12.9 10*3/uL — AB (ref 1.5–6.5)
PLATELETS: 394 10*3/uL (ref 140–400)
RBC: 3.79 10*6/uL — AB (ref 4.20–5.82)
RDW: 21.9 % — ABNORMAL HIGH (ref 11.0–14.6)
WBC: 15.2 10*3/uL — AB (ref 4.0–10.3)
lymph#: 0.7 10*3/uL — ABNORMAL LOW (ref 0.9–3.3)

## 2015-11-12 LAB — COMPREHENSIVE METABOLIC PANEL
ALBUMIN: 3.4 g/dL — AB (ref 3.5–5.0)
ALK PHOS: 75 U/L (ref 40–150)
ALT: <9 U/L (ref 0–55)
ANION GAP: 8 meq/L (ref 3–11)
AST: 11 U/L (ref 5–34)
BILIRUBIN TOTAL: 0.39 mg/dL (ref 0.20–1.20)
BUN: 15.9 mg/dL (ref 7.0–26.0)
CO2: 36 mEq/L — ABNORMAL HIGH (ref 22–29)
Calcium: 9.7 mg/dL (ref 8.4–10.4)
Chloride: 99 mEq/L (ref 98–109)
Creatinine: 1 mg/dL (ref 0.7–1.3)
EGFR: 77 mL/min/{1.73_m2} — AB (ref >90)
Glucose: 115 mg/dl (ref 70–140)
Potassium: 5.9 mEq/L — ABNORMAL HIGH (ref 3.5–5.1)
Sodium: 143 mEq/L (ref 136–145)
TOTAL PROTEIN: 6.4 g/dL (ref 6.4–8.3)

## 2015-11-12 LAB — TECHNOLOGIST REVIEW

## 2015-11-12 MED ORDER — ACETAMINOPHEN 325 MG PO TABS
ORAL_TABLET | ORAL | Status: AC
  Filled 2015-11-12: qty 2

## 2015-11-12 MED ORDER — SODIUM CHLORIDE 0.9 % IJ SOLN
10.0000 mL | INTRAMUSCULAR | Status: DC | PRN
  Administered 2015-11-12: 10 mL
  Filled 2015-11-12: qty 10

## 2015-11-12 MED ORDER — SODIUM CHLORIDE 0.9 % IV SOLN
375.0000 mg/m2 | Freq: Once | INTRAVENOUS | Status: AC
  Administered 2015-11-12: 900 mg via INTRAVENOUS
  Filled 2015-11-12: qty 80

## 2015-11-12 MED ORDER — SODIUM CHLORIDE 0.9 % IV SOLN
Freq: Once | INTRAVENOUS | Status: AC
  Administered 2015-11-12: 10:00:00 via INTRAVENOUS
  Filled 2015-11-12: qty 8

## 2015-11-12 MED ORDER — LIDOCAINE-PRILOCAINE 2.5-2.5 % EX CREA
TOPICAL_CREAM | CUTANEOUS | Status: AC
  Filled 2015-11-12: qty 5

## 2015-11-12 MED ORDER — ACETAMINOPHEN 325 MG PO TABS
650.0000 mg | ORAL_TABLET | Freq: Once | ORAL | Status: AC
  Administered 2015-11-12: 650 mg via ORAL

## 2015-11-12 MED ORDER — DOXORUBICIN HCL CHEMO IV INJECTION 2 MG/ML
50.0000 mg/m2 | Freq: Once | INTRAVENOUS | Status: AC
  Administered 2015-11-12: 112 mg via INTRAVENOUS
  Filled 2015-11-12: qty 56

## 2015-11-12 MED ORDER — VINCRISTINE SULFATE CHEMO INJECTION 1 MG/ML
2.0000 mg | Freq: Once | INTRAVENOUS | Status: AC
  Administered 2015-11-12: 2 mg via INTRAVENOUS
  Filled 2015-11-12: qty 2

## 2015-11-12 MED ORDER — SODIUM CHLORIDE 0.9 % IV SOLN
Freq: Once | INTRAVENOUS | Status: AC
  Administered 2015-11-12: 09:00:00 via INTRAVENOUS

## 2015-11-12 MED ORDER — SODIUM CHLORIDE 0.9 % IV SOLN
750.0000 mg/m2 | Freq: Once | INTRAVENOUS | Status: AC
  Administered 2015-11-12: 1680 mg via INTRAVENOUS
  Filled 2015-11-12: qty 84

## 2015-11-12 MED ORDER — HEPARIN SOD (PORK) LOCK FLUSH 100 UNIT/ML IV SOLN
500.0000 [IU] | Freq: Once | INTRAVENOUS | Status: AC | PRN
  Administered 2015-11-12: 500 [IU]
  Filled 2015-11-12: qty 5

## 2015-11-12 MED ORDER — DIPHENHYDRAMINE HCL 25 MG PO CAPS
ORAL_CAPSULE | ORAL | Status: AC
  Filled 2015-11-12: qty 2

## 2015-11-12 MED ORDER — DIPHENHYDRAMINE HCL 25 MG PO CAPS
50.0000 mg | ORAL_CAPSULE | Freq: Once | ORAL | Status: AC
  Administered 2015-11-12: 50 mg via ORAL

## 2015-11-12 NOTE — Progress Notes (Signed)
[name] [phone]   [phone] [place], [place] [address]  DIAGNOSIS: Stage III large B-cell non-Hodgkin lymphoma presenting with extensive lymphadenopathy involving the neck, chest, abdomen as well as splenomegaly diagnosed in December 2016.  PRIOR THERAPY:None  CURRENT THERAPY:Systemic chemotherapy with the CHOP/Rituxan every 3 weeks with Neulasta support.  INTERVAL HISTORY: [name] 65 y.o. male returns to the clinic today for follow-up visit accompanied by his wife and sister. The patient is currently tolerating his chemotherapy with CHOP/Rituxan fairly well was no significant adverse effects. He denied having any significant fever or chills. He has no nausea or vomiting. He denied having any significant weight loss or night sweats. He continues to have shortness breath at baseline and increased with exertion and currently on home oxygen secondary to COPD. He denied having any significant chest pain, cough or hemoptysis. The patient is here today for evaluation before starting cycle #3 of his treatment.  MEDICAL HISTORY: Past Medical History  Diagnosis Date  . Streptococcal pneumonia [facility]) August 2009  . Congestive heart failure ([place])   . Gastrointestinal obstruction ([facility])     Ileus  . Pulmonary hypertension ([place])   . Polycythemia   . Hypothyroid   . COPD (chronic obstructive pulmonary disease) ([place])   . Obesity   . Gout   . BPH (benign prostatic hyperplasia) 2011  . Depression   . Hypertension   . Lung cancer ([place])   . NHL (non-Hodgkin's lymphoma) ([place]) 09/25/2015  . Encounter for antineoplastic chemotherapy 10/08/2015  . Drug-induced neutropenia ([place]) 10/08/2015    ALLERGIES:  has No Known Allergies.  MEDICATIONS:  Current Outpatient Prescriptions  Medication Sig Dispense Refill  . albuterol (PROVENTIL) (2.5 MG/3ML) 0.083% nebulizer solution INHALE 3  MILLILITERS (2.5 MG) BY NEBULIZATION ROUTE EVERY 8 HOURS AS NEEDED  3  . allopurinol (ZYLOPRIM) 100 MG tablet Take 1 tablet (100 mg total) by mouth 2 (two) times daily. (Patient not taking: Reported on 10/22/2015) 60 tablet 4  . ALPRAZolam (XANAX) 0.5 MG tablet Take 0.5 mg by mouth 2 (two) times daily as needed for anxiety.    [name] amLODipine (NORVASC) 5 MG tablet Take 1 tablet by mouth Daily.    [name] aspirin 325 MG tablet Take 325 mg by mouth daily.      . betamethasone dipropionate 0.05 % lotion Apply 1 drop topically 2 (two) times daily as needed.   3  . clotrimazole (MYCELEX) 10 MG troche Take 1 lozenge (10 mg total) by mouth 5 (five) times daily. 30 lozenge 2  . finasteride (PROSCAR) 5 MG tablet Take 5 mg by mouth daily.    [name] levothyroxine (SYNTHROID, LEVOTHROID) 25 MCG tablet Take 125 mcg by mouth daily.    [name] lidocaine-prilocaine (EMLA) cream Apply 1 application topically as needed. 30 g 0  . lisinopril (PRINIVIL,ZESTRIL) 10 MG tablet Take 1 tablet (10 mg total) by mouth daily. 30 tablet 11  . loratadine (CLARITIN) 10 MG tablet Take 10 mg by mouth daily.    . magic mouthwash w/lidocaine SOLN Take 5 mLs by mouth 3 (three) times daily. 120 mL 1  . omeprazole (PRILOSEC) 20 MG capsule Take 1 tablet by mouth Twice daily.    [name] oxyCODONE-acetaminophen (PERCOCET) 10-325 MG tablet Take 1 tablet by mouth every 4 (four) hours as needed for pain.    . predniSONE (DELTASONE) 20 MG tablet Take 1 tablet (20 mg total) by  mouth daily with breakfast. (Patient not taking: Reported on 10/02/2015) 30 tablet 0  . predniSONE (DELTASONE) 50 MG tablet 2 tablets by mouth daily for 5 days starting with the first dose of chemotherapy every 3 weeks. 80 tablet 0  . prochlorperazine (COMPAZINE) 10 MG tablet Take 1 tablet (10 mg total) by mouth every 6 (six) hours as needed for nausea or vomiting. 60 tablet 0  . tiotropium (SPIRIVA) 18 MCG inhalation capsule Place 1 capsule (18 mcg total) into inhaler and inhale daily. 30 capsule 6    No current facility-administered medications for this visit.    SURGICAL HISTORY:  Past Surgical History  Procedure Laterality Date  . Appendectomy    . Back surgery    . Lung cancer surgery  2011  . Cardioversion  11/27/2011    Procedure: CARDIOVERSION;  Surgeon: Thayer Headings, MD;  Location: [facility] ENDOSCOPY;  Service: Cardiovascular;  Laterality: N/A;    REVIEW OF SYSTEMS:  A comprehensive review of systems was negative except for: Respiratory: positive for dyspnea on exertion   PHYSICAL EXAMINATION: General appearance: alert, cooperative and no distress Head: Normocephalic, without obvious abnormality, atraumatic Neck: no adenopathy, no JVD, supple, symmetrical, trachea midline and thyroid not enlarged, symmetric, no tenderness/mass/nodules Lymph nodes: Cervical, supraclavicular, and axillary nodes normal. Resp: wheezes bilaterally Back: symmetric, no curvature. ROM normal. No CVA tenderness. Cardio: regular rate and rhythm, S1, S2 normal, no murmur, click, rub or gallop GI: soft, non-tender; bowel sounds normal; no masses,  no organomegaly Extremities: extremities normal, atraumatic, no cyanosis or edema  ECOG PERFORMANCE STATUS: 1 - Symptomatic but completely ambulatory  Blood pressure 170/75, pulse 93, temperature 98.6 F (37 C), temperature source Oral, resp. rate 18, height 6' (1.829 m), weight 230 lb (104.327 kg), SpO2 93 %.  LABORATORY DATA: Lab Results  Component Value Date   WBC 15.2* 11/12/2015   HGB 10.6* 11/12/2015   HCT 33.4* 11/12/2015   MCV 88.2 11/12/2015   PLT 394 11/12/2015      Chemistry      Component Value Date/Time   NA 143 11/05/2015 1114   NA 143 10/03/2015 1204   K 5.0 11/05/2015 1114   K 4.9 10/03/2015 1204   CL 103 10/03/2015 1204   CO2 34* 11/05/2015 1114   CO2 30 10/03/2015 1204   BUN 15.6 11/05/2015 1114   BUN 25* 10/03/2015 1204   CREATININE 1.0 11/05/2015 1114   CREATININE 0.82 10/03/2015 1204      Component Value Date/Time    CALCIUM 9.3 11/05/2015 1114   CALCIUM 9.0 10/03/2015 1204   ALKPHOS 76 11/05/2015 1114   ALKPHOS 72 10/23/2010 0358   AST 8 11/05/2015 1114   AST 14 10/23/2010 0358   ALT 10 11/05/2015 1114   ALT 17 10/23/2010 0358   BILITOT 0.45 11/05/2015 1114   BILITOT 0.4 10/23/2010 0358       RADIOGRAPHIC STUDIES: No results found.  ASSESSMENT AND PLAN: This is a very pleasant 65 years old white male diagnosed with a stage III large B-cell non-Hodgkin lymphoma and currently undergoing systemic chemotherapy with CHOP/Rituxan status post 2 cycles. He tolerated the first 2 cycles of his treatment fairly well with no significant adverse effects. I recommended for the patient to proceed with cycle #3 today as a scheduled. He will come back for follow-up visit in 3 weeks for evaluation after repeating CT scan of the chest, abdomen and pelvis for restaging of his disease. For COPD, he is followed by pulmonary medicine and he  was advised to keep his appointment with him for evaluation of his condition. The patient was advised to call immediately if he has any concerning symptoms in the interval. The patient voices understanding of current disease status and treatment options and is in agreement with the current care plan.  All questions were answered. The patient knows to call the clinic with any problems, questions or concerns. We can certainly see the patient much sooner if necessary.  Disclaimer: This note was dictated with voice recognition software. Similar sounding words can inadvertently be transcribed and may not be corrected upon review.

## 2015-11-12 NOTE — Patient Instructions (Signed)
Rituximab injection What is this medicine? RITUXIMAB (ri TUX i mab) is a monoclonal antibody. It is used commonly to treat non-Hodgkin lymphoma and other conditions. It is also used to treat rheumatoid arthritis (RA). In RA, this medicine slows the inflammatory process and help reduce joint pain and swelling. This medicine is often used with other cancer or arthritis medications. This medicine may be used for other purposes; ask your health care provider or pharmacist if you have questions. What should I tell my health care provider before I take this medicine? They need to know if you have any of these conditions: -blood disorders -heart disease -history of hepatitis B -infection (especially a virus infection such as chickenpox, cold sores, or herpes) -irregular heartbeat -kidney disease -lung or breathing disease, like asthma -lupus -an unusual or allergic reaction to rituximab, mouse proteins, other medicines, foods, dyes, or preservatives -pregnant or trying to get pregnant -breast-feeding How should I use this medicine? This medicine is for infusion into a vein. It is administered in a hospital or clinic by a specially trained health care professional. A special MedGuide will be given to you by the pharmacist with each prescription and refill. Be sure to read this information carefully each time. Talk to your pediatrician regarding the use of this medicine in children. This medicine is not approved for use in children. Overdosage: If you think you have taken too much of this medicine contact a poison control center or emergency room at once. NOTE: This medicine is only for you. Do not share this medicine with others. What if I miss a dose? It is important not to miss a dose. Call your doctor or health care professional if you are unable to keep an appointment. What may interact with this medicine? -cisplatin -medicines for blood pressure -some other medicines for  arthritis -vaccines This list may not describe all possible interactions. Give your health care provider a list of all the medicines, herbs, non-prescription drugs, or dietary supplements you use. Also tell them if you smoke, drink alcohol, or use illegal drugs. Some items may interact with your medicine. What should I watch for while using this medicine? Report any side effects that you notice during your treatment right away, such as changes in your breathing, fever, chills, dizziness or lightheadedness. These effects are more common with the first dose. Visit your prescriber or health care professional for checks on your progress. You will need to have regular blood work. Report any other side effects. The side effects of this medicine can continue after you finish your treatment. Continue your course of treatment even though you feel ill unless your doctor tells you to stop. Call your doctor or health care professional for advice if you get a fever, chills or sore throat, or other symptoms of a cold or flu. Do not treat yourself. This drug decreases your body's ability to fight infections. Try to avoid being around people who are sick. This medicine may increase your risk to bruise or bleed. Call your doctor or health care professional if you notice any unusual bleeding. Be careful brushing and flossing your teeth or using a toothpick because you may get an infection or bleed more easily. If you have any dental work done, tell your dentist you are receiving this medicine. Avoid taking products that contain aspirin, acetaminophen, ibuprofen, naproxen, or ketoprofen unless instructed by your doctor. These medicines may hide a fever. Do not become pregnant while taking this medicine. Women should inform their doctor if  they wish to become pregnant or think they might be pregnant. There is a potential for serious side effects to an unborn child. Talk to your health care professional or pharmacist for more  information. Do not breast-feed an infant while taking this medicine. What side effects may I notice from receiving this medicine? Side effects that you should report to your doctor or health care professional as soon as possible: -allergic reactions like skin rash, itching or hives, swelling of the face, lips, or tongue -low blood counts - this medicine may decrease the number of white blood cells, red blood cells and platelets. You may be at increased risk for infections and bleeding. -signs of infection - fever or chills, cough, sore throat, pain or difficulty passing urine -signs of decreased platelets or bleeding - bruising, pinpoint red spots on the skin, black, tarry stools, blood in the urine -signs of decreased red blood cells - unusually weak or tired, fainting spells, lightheadedness -breathing problems -confused, not responsive -chest pain -fast, irregular heartbeat -feeling faint or lightheaded, falls -mouth sores -redness, blistering, peeling or loosening of the skin, including inside the mouth -stomach pain -swelling of the ankles, feet, or hands -trouble passing urine or change in the amount of urine Side effects that usually do not require medical attention (report to your doctor or other health care professional if they continue or are bothersome): -anxiety -headache -loss of appetite -muscle aches -nausea -night sweats This list may not describe all possible side effects. Call your doctor for medical advice about side effects. You may report side effects to FDA at 1-800-FDA-1088. Where should I keep my medicine? This drug is given in a hospital or clinic and will not be stored at home. NOTE: This sheet is a summary. It may not cover all possible information. If you have questions about this medicine, talk to your doctor, pharmacist, or health care provider.    2016, [employer] Standard. (2014-11-14 22:30:56) Cyclophosphamide injection What is this  medicine? CYCLOPHOSPHAMIDE (sye kloe FOSS fa mide) is a chemotherapy drug. It slows the growth of cancer cells. This medicine is used to treat many types of cancer like lymphoma, myeloma, leukemia, breast cancer, and ovarian cancer, to name a few. This medicine may be used for other purposes; ask your health care provider or pharmacist if you have questions. What should I tell my health care provider before I take this medicine? They need to know if you have any of these conditions: -blood disorders -history of other chemotherapy -infection -kidney disease -liver disease -recent or ongoing radiation therapy -tumors in the bone marrow -an unusual or allergic reaction to cyclophosphamide, other chemotherapy, other medicines, foods, dyes, or preservatives -pregnant or trying to get pregnant -breast-feeding How should I use this medicine? This drug is usually given as an injection into a vein or muscle or by infusion into a vein. It is administered in a hospital or clinic by a specially trained health care professional. Talk to your pediatrician regarding the use of this medicine in children. Special care may be needed. Overdosage: If you think you have taken too much of this medicine contact a poison control center or emergency room at once. NOTE: This medicine is only for you. Do not share this medicine with others. What if I miss a dose? It is important not to miss your dose. Call your doctor or health care professional if you are unable to keep an appointment. What may interact with this medicine? This medicine may interact with the  following medications: -amiodarone -amphotericin B -azathioprine -certain antiviral medicines for HIV or AIDS such as protease inhibitors (e.g., indinavir, ritonavir) and zidovudine -certain blood pressure medications such as benazepril, captopril, enalapril, fosinopril, lisinopril, moexipril, monopril, perindopril, quinapril, ramipril, trandolapril -certain  cancer medications such as anthracyclines (e.g., daunorubicin, doxorubicin), busulfan, cytarabine, paclitaxel, pentostatin, tamoxifen, trastuzumab -certain diuretics such as chlorothiazide, chlorthalidone, hydrochlorothiazide, indapamide, metolazone -certain medicines that treat or prevent blood clots like warfarin -certain muscle relaxants such as succinylcholine -cyclosporine -etanercept -indomethacin -medicines to increase blood counts like filgrastim, pegfilgrastim, sargramostim -medicines used as general anesthesia -metronidazole -natalizumab This list may not describe all possible interactions. Give your health care provider a list of all the medicines, herbs, non-prescription drugs, or dietary supplements you use. Also tell them if you smoke, drink alcohol, or use illegal drugs. Some items may interact with your medicine. What should I watch for while using this medicine? Visit your doctor for checks on your progress. This drug may make you feel generally unwell. This is not uncommon, as chemotherapy can affect healthy cells as well as cancer cells. Report any side effects. Continue your course of treatment even though you feel ill unless your doctor tells you to stop. Drink water or other fluids as directed. Urinate often, even at night. In some cases, you may be given additional medicines to help with side effects. Follow all directions for their use. Call your doctor or health care professional for advice if you get a fever, chills or sore throat, or other symptoms of a cold or flu. Do not treat yourself. This drug decreases your body's ability to fight infections. Try to avoid being around people who are sick. This medicine may increase your risk to bruise or bleed. Call your doctor or health care professional if you notice any unusual bleeding. Be careful brushing and flossing your teeth or using a toothpick because you may get an infection or bleed more easily. If you have any dental  work done, tell your dentist you are receiving this medicine. You may get drowsy or dizzy. Do not drive, use machinery, or do anything that needs mental alertness until you know how this medicine affects you. Do not become pregnant while taking this medicine or for 1 year after stopping it. Women should inform their doctor if they wish to become pregnant or think they might be pregnant. Men should not father a child while taking this medicine and for 4 months after stopping it. There is a potential for serious side effects to an unborn child. Talk to your health care professional or pharmacist for more information. Do not breast-feed an infant while taking this medicine. This medicine may interfere with the ability to have a child. This medicine has caused ovarian failure in some women. This medicine has caused reduced sperm counts in some men. You should talk with your doctor or health care professional if you are concerned about your fertility. If you are going to have surgery, tell your doctor or health care professional that you have taken this medicine. What side effects may I notice from receiving this medicine? Side effects that you should report to your doctor or health care professional as soon as possible: -allergic reactions like skin rash, itching or hives, swelling of the face, lips, or tongue -low blood counts - this medicine may decrease the number of white blood cells, red blood cells and platelets. You may be at increased risk for infections and bleeding. -signs of infection - fever or chills, cough, sore  throat, pain or difficulty passing urine -signs of decreased platelets or bleeding - bruising, pinpoint red spots on the skin, black, tarry stools, blood in the urine -signs of decreased red blood cells - unusually weak or tired, fainting spells, lightheadedness -breathing problems -dark urine -dizziness -palpitations -swelling of the ankles, feet, hands -trouble passing urine or  change in the amount of urine -weight gain -yellowing of the eyes or skin Side effects that usually do not require medical attention (report to your doctor or health care professional if they continue or are bothersome): -changes in nail or skin color -hair loss -missed menstrual periods -mouth sores -nausea, vomiting This list may not describe all possible side effects. Call your doctor for medical advice about side effects. You may report side effects to FDA at 1-800-FDA-1088. Where should I keep my medicine? This drug is given in a hospital or clinic and will not be stored at home. NOTE: This sheet is a summary. It may not cover all possible information. If you have questions about this medicine, talk to your doctor, pharmacist, or health care provider.    2016, [employer] Standard. (2012-07-22 16:22:58) Vincristine injection What is this medicine? VINCRISTINE (vin KRIS teen) is a chemotherapy drug. It slows the growth of cancer cells. This medicine is used to treat many types of cancer like Hodgkin's disease, leukemia, non-Hodgkin's lymphoma, neuroblastoma (brain cancer), rhabdomyosarcoma, and Wilms' tumor. This medicine may be used for other purposes; ask your health care provider or pharmacist if you have questions. What should I tell my health care provider before I take this medicine? They need to know if you have any of these conditions: -blood disorders -gout -infection (especially chickenpox, cold sores, or herpes) -kidney disease -liver disease -lung disease -nervous system disease like Charcot-Marie-Tooth (CMT) -recent or ongoing radiation therapy -an unusual or allergic reaction to vincristine, other chemotherapy agents, other medicines, foods, dyes, or preservatives -pregnant or trying to get pregnant -breast-feeding How should I use this medicine? This drug is given as an infusion into a vein. It is administered in a hospital or clinic by a specially trained  health care professional. If you have pain, swelling, burning, or any unusual feeling around the site of your injection, tell your health care professional right away. Talk to your pediatrician regarding the use of this medicine in children. While this drug may be prescribed for selected conditions, precautions do apply. Overdosage: If you think you have taken too much of this medicine contact a poison control center or emergency room at once. NOTE: This medicine is only for you. Do not share this medicine with others. What if I miss a dose? It is important not to miss your dose. Call your doctor or health care professional if you are unable to keep an appointment. What may interact with this medicine? Do not take this medicine with any of the following medications: -itraconazole -mibefradil -voriconazole This medicine may also interact with the following medications: -cyclosporine -erythromycin -fluconazole -ketoconazole -medicines for HIV like delavirdine, efavirenz, nevirapine -medicines for seizures like ethotoin, fosphenotoin, phenytoin -medicines to increase blood counts like filgrastim, pegfilgrastim, sargramostim -other chemotherapy drugs like cisplatin, L-asparaginase, methotrexate, mitomycin, paclitaxel -pegaspargase -vaccines -zalcitabine, ddC Talk to your doctor or health care professional before taking any of these medicines: -acetaminophen -aspirin -ibuprofen -ketoprofen -naproxen This list may not describe all possible interactions. Give your health care provider a list of all the medicines, herbs, non-prescription drugs, or dietary supplements you use. Also tell them if you smoke, drink alcohol,  or use illegal drugs. Some items may interact with your medicine. What should I watch for while using this medicine? Your condition will be monitored carefully while you are receiving this medicine. You will need important blood work done while you are taking this  medicine. This drug may make you feel generally unwell. This is not uncommon, as chemotherapy can affect healthy cells as well as cancer cells. Report any side effects. Continue your course of treatment even though you feel ill unless your doctor tells you to stop. In some cases, you may be given additional medicines to help with side effects. Follow all directions for their use. Call your doctor or health care professional for advice if you get a fever, chills or sore throat, or other symptoms of a cold or flu. Do not treat yourself. Avoid taking products that contain aspirin, acetaminophen, ibuprofen, naproxen, or ketoprofen unless instructed by your doctor. These medicines may hide a fever. Do not become pregnant while taking this medicine. Women should inform their doctor if they wish to become pregnant or think they might be pregnant. There is a potential for serious side effects to an unborn child. Talk to your health care professional or pharmacist for more information. Do not breast-feed an infant while taking this medicine. Men may have a lower sperm count while taking this medicine. Talk to your doctor if you plan to father a child. What side effects may I notice from receiving this medicine? Side effects that you should report to your doctor or health care professional as soon as possible: -allergic reactions like skin rash, itching or hives, swelling of the face, lips, or tongue -breathing problems -confusion or changes in emotions or moods -constipation -cough -mouth sores -muscle weakness -nausea and vomiting -pain, swelling, redness or irritation at the injection site -pain, tingling, numbness in the hands or feet -problems with balance, talking, walking -seizures -stomach pain -trouble passing urine or change in the amount of urine Side effects that usually do not require medical attention (report to your doctor or health care professional if they continue or are  bothersome): -diarrhea -hair loss -jaw pain -loss of appetite This list may not describe all possible side effects. Call your doctor for medical advice about side effects. You may report side effects to FDA at 1-800-FDA-1088. Where should I keep my medicine? This drug is given in a hospital or clinic and will not be stored at home. NOTE: This sheet is a summary. It may not cover all possible information. If you have questions about this medicine, talk to your doctor, pharmacist, or health care provider.    2016, [employer] Standard. (2008-06-04 17:17:13) Doxorubicin injection What is this medicine? DOXORUBICIN (dox oh ROO bi sin) is a chemotherapy drug. It is used to treat many kinds of cancer like Hodgkin's disease, leukemia, non-Hodgkin's lymphoma, neuroblastoma, sarcoma, and Wilms' tumor. It is also used to treat bladder cancer, breast cancer, lung cancer, ovarian cancer, stomach cancer, and thyroid cancer. This medicine may be used for other purposes; ask your health care provider or pharmacist if you have questions. What should I tell my health care provider before I take this medicine? They need to know if you have any of these conditions: -blood disorders -heart disease, recent heart attack -infection (especially a virus infection such as chickenpox, cold sores, or herpes) -irregular heartbeat -liver disease -recent or ongoing radiation therapy -an unusual or allergic reaction to doxorubicin, other chemotherapy agents, other medicines, foods, dyes, or preservatives -pregnant or trying to  get pregnant -breast-feeding How should I use this medicine? This drug is given as an infusion into a vein. It is administered in a hospital or clinic by a specially trained health care professional. If you have pain, swelling, burning or any unusual feeling around the site of your injection, tell your health care professional right away. Talk to your pediatrician regarding the use of this  medicine in children. Special care may be needed. Overdosage: If you think you have taken too much of this medicine contact a poison control center or emergency room at once. NOTE: This medicine is only for you. Do not share this medicine with others. What if I miss a dose? It is important not to miss your dose. Call your doctor or health care professional if you are unable to keep an appointment. What may interact with this medicine? Do not take this medicine with any of the following medications: -cisapride -droperidol -halofantrine -pimozide -zidovudine This medicine may also interact with the following medications: -chloroquine -chlorpromazine -clarithromycin -cyclophosphamide -cyclosporine -erythromycin -medicines for depression, anxiety, or psychotic disturbances -medicines for irregular heart beat like amiodarone, bepridil, dofetilide, encainide, flecainide, propafenone, quinidine -medicines for seizures like ethotoin, fosphenytoin, phenytoin -medicines for nausea, vomiting like dolasetron, ondansetron, palonosetron -medicines to increase blood counts like filgrastim, pegfilgrastim, sargramostim -methadone -methotrexate -pentamidine -progesterone -vaccines -verapamil Talk to your doctor or health care professional before taking any of these medicines: -acetaminophen -aspirin -ibuprofen -ketoprofen -naproxen This list may not describe all possible interactions. Give your health care provider a list of all the medicines, herbs, non-prescription drugs, or dietary supplements you use. Also tell them if you smoke, drink alcohol, or use illegal drugs. Some items may interact with your medicine. What should I watch for while using this medicine? Your condition will be monitored carefully while you are receiving this medicine. You will need important blood work done while you are taking this medicine. This drug may make you feel generally unwell. This is not uncommon, as  chemotherapy can affect healthy cells as well as cancer cells. Report any side effects. Continue your course of treatment even though you feel ill unless your doctor tells you to stop. Your urine may turn red for a few days after your dose. This is not blood. If your urine is dark or brown, call your doctor. In some cases, you may be given additional medicines to help with side effects. Follow all directions for their use. Call your doctor or health care professional for advice if you get a fever, chills or sore throat, or other symptoms of a cold or flu. Do not treat yourself. This drug decreases your body's ability to fight infections. Try to avoid being around people who are sick. This medicine may increase your risk to bruise or bleed. Call your doctor or health care professional if you notice any unusual bleeding. Be careful brushing and flossing your teeth or using a toothpick because you may get an infection or bleed more easily. If you have any dental work done, tell your dentist you are receiving this medicine. Avoid taking products that contain aspirin, acetaminophen, ibuprofen, naproxen, or ketoprofen unless instructed by your doctor. These medicines may hide a fever. Men and women of childbearing age should use effective birth control methods while using taking this medicine. Do not become pregnant while taking this medicine. There is a potential for serious side effects to an unborn child. Talk to your health care professional or pharmacist for more information. Do not breast-feed an infant while  taking this medicine. Do not let others touch your urine or other body fluids for 5 days after each treatment with this medicine. Caregivers should wear latex gloves to avoid touching body fluids during this time. There is a maximum amount of this medicine you should receive throughout your life. The amount depends on the medical condition being treated and your overall health. Your doctor will watch  how much of this medicine you receive in your lifetime. Tell your doctor if you have taken this medicine before. What side effects may I notice from receiving this medicine? Side effects that you should report to your doctor or health care professional as soon as possible: -allergic reactions like skin rash, itching or hives, swelling of the face, lips, or tongue -low blood counts - this medicine may decrease the number of white blood cells, red blood cells and platelets. You may be at increased risk for infections and bleeding. -signs of infection - fever or chills, cough, sore throat, pain or difficulty passing urine -signs of decreased platelets or bleeding - bruising, pinpoint red spots on the skin, black, tarry stools, blood in the urine -signs of decreased red blood cells - unusually weak or tired, fainting spells, lightheadedness -breathing problems -chest pain -fast, irregular heartbeat -mouth sores -nausea, vomiting -pain, swelling, redness at site where injected -pain, tingling, numbness in the hands or feet -swelling of ankles, feet, or hands -unusual bleeding or bruising Side effects that usually do not require medical attention (report to your doctor or health care professional if they continue or are bothersome): -diarrhea -facial flushing -hair loss -loss of appetite -missed menstrual periods -nail discoloration or damage -red or watery eyes -red colored urine -stomach upset This list may not describe all possible side effects. Call your doctor for medical advice about side effects. You may report side effects to FDA at 1-800-FDA-1088. Where should I keep my medicine? This drug is given in a hospital or clinic and will not be stored at home. NOTE: This sheet is a summary. It may not cover all possible information. If you have questions about this medicine, talk to your doctor, pharmacist, or health care provider.    2016, Elsevier/Gold Standard. (2013-01-03  09:54:34)

## 2015-11-12 NOTE — Telephone Encounter (Signed)
Per staff message and POF I have scheduled appts. Advised scheduler of appts. JMW  

## 2015-11-12 NOTE — Telephone Encounter (Signed)
per pof to sch pt appt*sent Mwe email to sch pt trmt-pt to get updated copy of avs b4 leaving trmt room

## 2015-11-13 ENCOUNTER — Telehealth: Payer: Self-pay | Admitting: *Deleted

## 2015-11-13 NOTE — Telephone Encounter (Signed)
Spoke with patient, per dr Julien Nordmann, patient is to have neulasta injection after each chemotherapy treatment. Patient verbalized  Understanding.

## 2015-11-14 ENCOUNTER — Ambulatory Visit (HOSPITAL_BASED_OUTPATIENT_CLINIC_OR_DEPARTMENT_OTHER): Payer: PPO

## 2015-11-14 VITALS — BP 139/78 | HR 92 | Temp 98.5°F

## 2015-11-14 DIAGNOSIS — C8338 Diffuse large B-cell lymphoma, lymph nodes of multiple sites: Secondary | ICD-10-CM | POA: Diagnosis not present

## 2015-11-14 DIAGNOSIS — Z5189 Encounter for other specified aftercare: Secondary | ICD-10-CM | POA: Diagnosis not present

## 2015-11-14 DIAGNOSIS — C8258 Diffuse follicle center lymphoma, lymph nodes of multiple sites: Secondary | ICD-10-CM

## 2015-11-14 MED ORDER — PEGFILGRASTIM INJECTION 6 MG/0.6ML ~~LOC~~
6.0000 mg | PREFILLED_SYRINGE | Freq: Once | SUBCUTANEOUS | Status: AC
  Administered 2015-11-14: 6 mg via SUBCUTANEOUS
  Filled 2015-11-14: qty 0.6

## 2015-11-19 ENCOUNTER — Other Ambulatory Visit (HOSPITAL_BASED_OUTPATIENT_CLINIC_OR_DEPARTMENT_OTHER): Payer: PPO

## 2015-11-19 DIAGNOSIS — C8338 Diffuse large B-cell lymphoma, lymph nodes of multiple sites: Secondary | ICD-10-CM

## 2015-11-19 LAB — CBC WITH DIFFERENTIAL/PLATELET
BASO%: 0.5 % (ref 0.0–2.0)
BASOS ABS: 0 10*3/uL (ref 0.0–0.1)
EOS ABS: 0.1 10*3/uL (ref 0.0–0.5)
EOS%: 2.4 % (ref 0.0–7.0)
HCT: 30.5 % — ABNORMAL LOW (ref 38.4–49.9)
HEMOGLOBIN: 9.6 g/dL — AB (ref 13.0–17.1)
LYMPH%: 2.8 % — AB (ref 14.0–49.0)
MCH: 28.6 pg (ref 27.2–33.4)
MCHC: 31.5 g/dL — ABNORMAL LOW (ref 32.0–36.0)
MCV: 90.8 fL (ref 79.3–98.0)
MONO#: 0.2 10*3/uL (ref 0.1–0.9)
MONO%: 4 % (ref 0.0–14.0)
NEUT%: 90.3 % — ABNORMAL HIGH (ref 39.0–75.0)
NEUTROS ABS: 3.8 10*3/uL (ref 1.5–6.5)
PLATELETS: 85 10*3/uL — AB (ref 140–400)
RBC: 3.36 10*6/uL — AB (ref 4.20–5.82)
RDW: 18.4 % — AB (ref 11.0–14.6)
WBC: 4.2 10*3/uL (ref 4.0–10.3)
lymph#: 0.1 10*3/uL — ABNORMAL LOW (ref 0.9–3.3)

## 2015-11-19 LAB — COMPREHENSIVE METABOLIC PANEL
AST: 7 U/L (ref 5–34)
Albumin: 3.3 g/dL — ABNORMAL LOW (ref 3.5–5.0)
Alkaline Phosphatase: 117 U/L (ref 40–150)
Anion Gap: 6 mEq/L (ref 3–11)
BILIRUBIN TOTAL: 0.43 mg/dL (ref 0.20–1.20)
BUN: 15.5 mg/dL (ref 7.0–26.0)
CO2: 34 meq/L — AB (ref 22–29)
CREATININE: 1 mg/dL (ref 0.7–1.3)
Calcium: 8.5 mg/dL (ref 8.4–10.4)
Chloride: 99 mEq/L (ref 98–109)
EGFR: 84 mL/min/{1.73_m2} — ABNORMAL LOW (ref >90)
Glucose: 167 mg/dl — ABNORMAL HIGH (ref 70–140)
Potassium: 4.4 mEq/L (ref 3.5–5.1)
SODIUM: 139 meq/L (ref 136–145)
TOTAL PROTEIN: 6 g/dL — AB (ref 6.4–8.3)

## 2015-11-24 DIAGNOSIS — J449 Chronic obstructive pulmonary disease, unspecified: Secondary | ICD-10-CM | POA: Diagnosis not present

## 2015-11-26 ENCOUNTER — Other Ambulatory Visit (HOSPITAL_BASED_OUTPATIENT_CLINIC_OR_DEPARTMENT_OTHER): Payer: PPO

## 2015-11-26 DIAGNOSIS — C8338 Diffuse large B-cell lymphoma, lymph nodes of multiple sites: Secondary | ICD-10-CM

## 2015-11-26 LAB — COMPREHENSIVE METABOLIC PANEL
ALBUMIN: 3.4 g/dL — AB (ref 3.5–5.0)
ALK PHOS: 100 U/L (ref 40–150)
ALT: 6 U/L (ref 0–55)
ANION GAP: 9 meq/L (ref 3–11)
AST: 10 U/L (ref 5–34)
BILIRUBIN TOTAL: 0.37 mg/dL (ref 0.20–1.20)
BUN: 11.3 mg/dL (ref 7.0–26.0)
CALCIUM: 8.9 mg/dL (ref 8.4–10.4)
CO2: 35 mEq/L — ABNORMAL HIGH (ref 22–29)
CREATININE: 0.9 mg/dL (ref 0.7–1.3)
Chloride: 98 mEq/L (ref 98–109)
EGFR: 87 mL/min/{1.73_m2} — AB (ref >90)
Glucose: 202 mg/dl — ABNORMAL HIGH (ref 70–140)
Potassium: 4.5 mEq/L (ref 3.5–5.1)
Sodium: 142 mEq/L (ref 136–145)
TOTAL PROTEIN: 6.3 g/dL — AB (ref 6.4–8.3)

## 2015-11-26 LAB — CBC WITH DIFFERENTIAL/PLATELET
BASO%: 1.1 % (ref 0.0–2.0)
BASOS ABS: 0.2 10*3/uL — AB (ref 0.0–0.1)
EOS%: 0.4 % (ref 0.0–7.0)
Eosinophils Absolute: 0.1 10*3/uL (ref 0.0–0.5)
HCT: 32.9 % — ABNORMAL LOW (ref 38.4–49.9)
HEMOGLOBIN: 10.2 g/dL — AB (ref 13.0–17.1)
LYMPH#: 0.4 10*3/uL — AB (ref 0.9–3.3)
LYMPH%: 3 % — ABNORMAL LOW (ref 14.0–49.0)
MCH: 29.1 pg (ref 27.2–33.4)
MCHC: 31 g/dL — ABNORMAL LOW (ref 32.0–36.0)
MCV: 93.7 fL (ref 79.3–98.0)
MONO#: 0.8 10*3/uL (ref 0.1–0.9)
MONO%: 5.3 % (ref 0.0–14.0)
NEUT#: 13.4 10*3/uL — ABNORMAL HIGH (ref 1.5–6.5)
NEUT%: 90.2 % — AB (ref 39.0–75.0)
NRBC: 1 % — AB (ref 0–0)
PLATELETS: 152 10*3/uL (ref 140–400)
RBC: 3.51 10*6/uL — AB (ref 4.20–5.82)
RDW: 19.5 % — AB (ref 11.0–14.6)
WBC: 14.9 10*3/uL — ABNORMAL HIGH (ref 4.0–10.3)

## 2015-11-27 ENCOUNTER — Ambulatory Visit (INDEPENDENT_AMBULATORY_CARE_PROVIDER_SITE_OTHER): Payer: PPO | Admitting: Internal Medicine

## 2015-11-27 ENCOUNTER — Encounter: Payer: Self-pay | Admitting: Internal Medicine

## 2015-11-27 VITALS — BP 148/80 | HR 92 | Ht 72.0 in | Wt 226.0 lb

## 2015-11-27 DIAGNOSIS — F4329 Adjustment disorder with other symptoms: Secondary | ICD-10-CM | POA: Diagnosis not present

## 2015-11-27 DIAGNOSIS — J9611 Chronic respiratory failure with hypoxia: Secondary | ICD-10-CM | POA: Diagnosis not present

## 2015-11-27 DIAGNOSIS — J449 Chronic obstructive pulmonary disease, unspecified: Secondary | ICD-10-CM | POA: Insufficient documentation

## 2015-11-27 HISTORY — DX: Chronic obstructive pulmonary disease, unspecified: J44.9

## 2015-11-27 HISTORY — DX: Adjustment disorder with other symptoms: F43.29

## 2015-11-27 NOTE — Progress Notes (Signed)
Subjective:     Patient ID: [name], male   DOB: August 21, 1951, 65 y.o.   MRN: [id number]  HPI    OV 11/27/2015  Chief Complaint  Patient presents with  . Follow-up    Pt states his breathing worsened after seeing SG in 09/2015 but it has since improved. Pt states his SOB is not back baseline. Pt c/o DOE, prod cough with yellow and green mucus x 51month. . Pt denies CP/tightness.     65year old male with COPD [stages Gold stage II in 2014] and chronic respiratory failure. Most recent diagnosis is lymphoma. He presents now for follow-up after his lymphoma diagnosis. He is now undergoing chemotherapy. He presents with his wife. Overall he reports that he is stable without exacerbation but in the last several months he says that his shortness of breath is significantly declined. He also says that he's having new oxygen requirement in the last several months. Without oxygen on room air he desaturates to 80% at home. In addition wife reports chronic baseline anxiety for which she takes Xanax as needed. He does not tolerate Xanax well. The anxiety is worse after starting chemotherapy. He has never been on chronic maintenance drugs for his anxiety. He is wondering about his latest lung function. He is noticed to be on Spiriva. He is also noticed to be on lisinopril but cough is not a major symptom. He is due to have a CT scan soon  [place] symptom assessment score forcancer  patient shows significant symptoms across various   [place] Symptom Assessment Numerical Scale 0 is no problem -> 10 worst problem 11/27/2015    No Pain -> Worst pain 3  No Tiredness -> Worset tiredness 5  No Nausea -> Worst nausea 0  No Depression -> Worst depression 0  No Anxiety -> Worst Anxiety 5  No Drowsiness -> Worst Drowsiness 0  Best appetite-> Worst Appetitle 0  Best Feeling of well being -> Worst feeling 3  No dyspnea-> Worst dyspnea 6  Other problem (none -> severe) x  Completed by  patient          has a past medical history of Streptococcal pneumonia ([facility] (August 2009); Congestive heart failure ([facility]; [place] ([facility]; Pulmonary hypertension ([facility]; Polycythemia; Hypothyroid; COPD (chronic obstructive pulmonary disease) ([facility] [facility]; Obesity; Gout; BPH (benign prostatic hyperplasia) (2011); Depression; Hypertension; Lung cancer ([facility]; NHL (non-Hodgkin's lymphoma) ([facility] (09/25/2015); Encounter for antineoplastic chemotherapy (10/08/2015); and Drug-induced neutropenia ([facility] (10/08/2015).   reports that he quit smoking about 14 years ago. His smoking use included Cigarettes. He has a 129 pack-year smoking history. He has never used smokeless tobacco.  Past Surgical History  Procedure Laterality Date  . Appendectomy    . Back surgery    . Lung cancer surgery  2011  . Cardioversion  11/27/2011    Procedure: CARDIOVERSION;  Surgeon: PThayer Headings MD;  [facility];  Service: Cardiovascular;  Laterality: N/A;    No Known Allergies  Immunization History  Administered Date(s) Administered  . Influenza Whole 06/21/2009, 08/30/2011  . Influenza-Unspecified 09/16/2015  . Pneumococcal Polysaccharide-23 05/22/2008  . Tdap 12/03/2011    Family History  Problem Relation Age of Onset  . Heart disease Father   . Cancer Neg Hx   . Diabetes Neg Hx   . Stroke Neg Hx   . Hypertension Neg Hx   . Kidney disease Neg Hx      Current outpatient prescriptions:  .  albuterol (PROVENTIL) (2.5 MG/3ML) 0.083% nebulizer solution, INHALE  3 MILLILITERS (2.5 MG) BY NEBULIZATION ROUTE EVERY 8 HOURS AS NEEDED, Disp: , Rfl: 3 .  allopurinol (ZYLOPRIM) 100 MG tablet, Take 1 tablet (100 mg total) by mouth 2 (two) times daily., Disp: 60 tablet, Rfl: 4 .  ALPRAZolam (XANAX) 0.5 MG tablet, Take 0.5 mg by mouth 2 (two) times daily as needed for anxiety., Disp: , Rfl:  .  amLODipine (NORVASC) 5 MG tablet, Take 1 tablet by mouth Daily., Disp: , Rfl:  .  aspirin 325 MG tablet, Take 325 mg by mouth  daily.  , Disp: , Rfl:  .  betamethasone dipropionate 0.05 % lotion, Apply 1 drop topically 2 (two) times daily as needed. , Disp: , Rfl: 3 .  finasteride (PROSCAR) 5 MG tablet, Take 5 mg by mouth daily., Disp: , Rfl:  .  levothyroxine (SYNTHROID, LEVOTHROID) 150 MCG tablet, Take 150 mcg by mouth daily., Disp: , Rfl:  .  lidocaine-prilocaine (EMLA) cream, Apply 1 application topically as needed., Disp: 30 g, Rfl: 0 .  lisinopril (PRINIVIL,ZESTRIL) 10 MG tablet, Take 1 tablet (10 mg total) by mouth daily., Disp: 30 tablet, Rfl: 11 .  loratadine (CLARITIN) 10 MG tablet, Take 10 mg by mouth daily. Reported on 11/12/2015, Disp: , Rfl:  .  magic mouthwash w/lidocaine SOLN, Take 5 mLs by mouth 3 (three) times daily., Disp: 120 mL, Rfl: 1 .  omeprazole (PRILOSEC) 20 MG capsule, Take 1 tablet by mouth Twice daily., Disp: , Rfl:  .  oxyCODONE-acetaminophen (PERCOCET) 10-325 MG tablet, Take 1 tablet by mouth every 4 (four) hours as needed for pain., Disp: , Rfl:  .  predniSONE (DELTASONE) 20 MG tablet, Take 1 tablet (20 mg total) by mouth daily with breakfast., Disp: 30 tablet, Rfl: 0 .  predniSONE (DELTASONE) 50 MG tablet, 2 tablets by mouth daily for 5 days starting with the first dose of chemotherapy every 3 weeks., Disp: 80 tablet, Rfl: 0 .  prochlorperazine (COMPAZINE) 10 MG tablet, Take 1 tablet (10 mg total) by mouth every 6 (six) hours as needed for nausea or vomiting., Disp: 60 tablet, Rfl: 0 .  tiotropium (SPIRIVA) 18 MCG inhalation capsule, Place 1 capsule (18 mcg total) into inhaler and inhale daily., Disp: 30 capsule, Rfl: 6     Review of Systems  Per hpi    Objective:   Physical Exam  Constitutional: He is oriented to person, place, and time. He appears well-developed and well-nourished. No distress.  Somewhat deconditioned looking  HENT:  Head: Normocephalic and atraumatic.  Right Ear: External ear normal.  Left Ear: External ear normal.  Mouth/Throat: Oropharynx is clear and moist.  No oropharyngeal exudate.  Alopecia on skull Beard +  Eyes: Conjunctivae and EOM are normal. Pupils are equal, round, and reactive to light. Right eye exhibits no discharge. Left eye exhibits no discharge. No scleral icterus.  Neck: Normal range of motion. Neck supple. No JVD present. No tracheal deviation present. No thyromegaly present.  Cardiovascular: Normal rate, regular rhythm and intact distal pulses.  Exam reveals no gallop and no friction rub.   No murmur heard. Pulmonary/Chest: Effort normal and breath sounds normal. No respiratory distress. He has no wheezes. He has no rales. He exhibits no tenderness.  o2 on  clear  Abdominal: Soft. Bowel sounds are normal. He exhibits no distension and no mass. There is no tenderness. There is no rebound and no guarding.  Musculoskeletal: Normal range of motion. He exhibits no edema or tenderness.  Lymphadenopathy:    He has no  cervical adenopathy.  Neurological: He is alert and oriented to person, place, and time. He has normal reflexes. No cranial nerve deficit. Coordination normal.  Skin: Skin is warm and dry. No rash noted. He is not diaphoretic. No erythema. No pallor.  Dry skin  Psychiatric: He has a normal mood and affect. His behavior is normal. Judgment and thought content normal.  Mild anxious dispo  Nursing note and vitals reviewed.   Filed Vitals:   11/27/15 0946  BP: 148/80  Pulse: 92  Height: 6' (1.829 m)  Weight: 226 lb (102.513 kg)  SpO2: 96%         Assessment:       ICD-9-CM ICD-10-CM   1. Chronic obstructive pulmonary disease, unspecified COPD type ([place]) 496 J44.9   2. Chronic respiratory failure with hypoxia (HCC) 518.83 J96.11    799.02    3. Adjustment disorder with other symptom 309.89 F43.29        Plan:       #COPD and chronic hypoxemic respiratory failure - Disease is stable without exacerbation but I do agree that you had a decline in oxygen status in the last several months - Unclear why you  have had this decline - last pulmonary function test was in 2014 and he had moderate lung function at that time - Continue oxygen and Spiriva  -  Plan - Do full pulmonary function test next few weeks - We will await results of the CT scan chest that he having in the next few days  - Based on these results we might have you switch off lisinopril and maybe add another inhaler to spiriva   #Anxiety  - Please talk to primary care physician  [name], MD to explore the role of chronic scheduled daily maintenance drugs for anxiety such as Celexa or Lexapro   #Follow-up  - Pulmonary function tests in the next few weeks  - Return to see me or my nurse practitioner in the next few weeks after pulmonary function test   > 50% of this > 25 min visit spent in face to face counseling or coordination of care    Dr. Brand Males, M.D., [facility].C.P Pulmonary and Critical Care Medicine Staff Physician [place] [facility] Pager: [phone], If no answer or between  15:00h - 7:00h: call [phone]  11/27/2015 10:16 AM

## 2015-11-27 NOTE — Patient Instructions (Addendum)
ICD-9-CM ICD-10-CM   1. Chronic obstructive pulmonary disease, unspecified COPD type ([place]) 496 J44.9   2. Chronic respiratory failure with hypoxia (HCC) 518.83 J96.11    799.02    3. Adjustment disorder with other symptom 309.89 F43.29     #COPD and chronic hypoxemic respiratory failure - Disease is stable without exacerbation but I do agree that you had a decline in oxygen status in the last several months - Unclear why you have had this decline - last pulmonary function test was in 2014 and he had moderate lung function at that time - Continue oxygen and Spiriva  -  Plan - Do full pulmonary function test next few weeks - We will await results of the CT scan chest that he having in the next few days  - Based on these results we might have you switch off lisinopril and maybe add another inhaler to spiriva   #Anxiety  - Please talk to primary care physician  [name], MD to explore the role of chronic scheduled daily maintenance drugs for anxiety such as Celexa or Lexapro   #Follow-up  - Pulmonary function tests in the next few weeks  - Return to see me or my nurse practitioner in the next few weeks after pulmonary function test

## 2015-12-02 ENCOUNTER — Ambulatory Visit (HOSPITAL_COMMUNITY)
Admission: RE | Admit: 2015-12-02 | Discharge: 2015-12-02 | Disposition: A | Discharge status: Home / Self Care | Payer: PPO | Source: Ambulatory Visit | Attending: Internal Medicine | Admitting: Internal Medicine

## 2015-12-02 ENCOUNTER — Encounter (HOSPITAL_COMMUNITY): Payer: Self-pay

## 2015-12-02 DIAGNOSIS — N200 Calculus of kidney: Secondary | ICD-10-CM | POA: Insufficient documentation

## 2015-12-02 DIAGNOSIS — C8258 Diffuse follicle center lymphoma, lymph nodes of multiple sites: Secondary | ICD-10-CM

## 2015-12-02 DIAGNOSIS — Z9221 Personal history of antineoplastic chemotherapy: Secondary | ICD-10-CM | POA: Diagnosis not present

## 2015-12-02 DIAGNOSIS — J948 Other specified pleural conditions: Secondary | ICD-10-CM | POA: Diagnosis not present

## 2015-12-02 DIAGNOSIS — I2510 Atherosclerotic heart disease of native coronary artery without angina pectoris: Secondary | ICD-10-CM | POA: Insufficient documentation

## 2015-12-02 DIAGNOSIS — R161 Splenomegaly, not elsewhere classified: Secondary | ICD-10-CM | POA: Insufficient documentation

## 2015-12-02 DIAGNOSIS — Z5111 Encounter for antineoplastic chemotherapy: Secondary | ICD-10-CM

## 2015-12-02 DIAGNOSIS — R590 Localized enlarged lymph nodes: Secondary | ICD-10-CM | POA: Diagnosis not present

## 2015-12-02 MED ORDER — IOHEXOL 300 MG/ML  SOLN
100.0000 mL | Freq: Once | INTRAMUSCULAR | Status: AC | PRN
  Administered 2015-12-02: 100 mL via INTRAVENOUS

## 2015-12-03 ENCOUNTER — Other Ambulatory Visit: Payer: Self-pay | Admitting: Medical Oncology

## 2015-12-03 ENCOUNTER — Ambulatory Visit: Payer: PPO

## 2015-12-03 ENCOUNTER — Other Ambulatory Visit (HOSPITAL_BASED_OUTPATIENT_CLINIC_OR_DEPARTMENT_OTHER): Payer: PPO

## 2015-12-03 ENCOUNTER — Encounter: Payer: Self-pay | Admitting: Internal Medicine

## 2015-12-03 ENCOUNTER — Ambulatory Visit (HOSPITAL_BASED_OUTPATIENT_CLINIC_OR_DEPARTMENT_OTHER): Payer: PPO

## 2015-12-03 ENCOUNTER — Ambulatory Visit (HOSPITAL_BASED_OUTPATIENT_CLINIC_OR_DEPARTMENT_OTHER): Payer: PPO | Admitting: Internal Medicine

## 2015-12-03 VITALS — BP 99/59 | HR 98 | Temp 98.5°F | Resp 18

## 2015-12-03 VITALS — BP 147/70 | HR 102 | Temp 98.7°F | Resp 18 | Ht 72.0 in | Wt 224.9 lb

## 2015-12-03 DIAGNOSIS — J449 Chronic obstructive pulmonary disease, unspecified: Secondary | ICD-10-CM

## 2015-12-03 DIAGNOSIS — C8338 Diffuse large B-cell lymphoma, lymph nodes of multiple sites: Secondary | ICD-10-CM

## 2015-12-03 DIAGNOSIS — C8258 Diffuse follicle center lymphoma, lymph nodes of multiple sites: Secondary | ICD-10-CM

## 2015-12-03 DIAGNOSIS — Z5111 Encounter for antineoplastic chemotherapy: Secondary | ICD-10-CM | POA: Diagnosis not present

## 2015-12-03 DIAGNOSIS — Z5112 Encounter for antineoplastic immunotherapy: Secondary | ICD-10-CM | POA: Diagnosis not present

## 2015-12-03 DIAGNOSIS — R0602 Shortness of breath: Secondary | ICD-10-CM

## 2015-12-03 DIAGNOSIS — Z95828 Presence of other vascular implants and grafts: Secondary | ICD-10-CM

## 2015-12-03 DIAGNOSIS — C3492 Malignant neoplasm of unspecified part of left bronchus or lung: Secondary | ICD-10-CM

## 2015-12-03 DIAGNOSIS — J440 Chronic obstructive pulmonary disease with acute lower respiratory infection: Secondary | ICD-10-CM

## 2015-12-03 LAB — CBC WITH DIFFERENTIAL/PLATELET
BASO%: 0.5 % (ref 0.0–2.0)
Basophils Absolute: 0.1 10*3/uL (ref 0.0–0.1)
EOS ABS: 0.1 10*3/uL (ref 0.0–0.5)
EOS%: 0.3 % (ref 0.0–7.0)
HEMATOCRIT: 34.8 % — AB (ref 38.4–49.9)
HEMOGLOBIN: 11 g/dL — AB (ref 13.0–17.1)
LYMPH#: 0.4 10*3/uL — AB (ref 0.9–3.3)
LYMPH%: 2.7 % — ABNORMAL LOW (ref 14.0–49.0)
MCH: 29 pg (ref 27.2–33.4)
MCHC: 31.7 g/dL — ABNORMAL LOW (ref 32.0–36.0)
MCV: 91.4 fL (ref 79.3–98.0)
MONO#: 0.3 10*3/uL (ref 0.1–0.9)
MONO%: 1.8 % (ref 0.0–14.0)
NEUT%: 94.7 % — ABNORMAL HIGH (ref 39.0–75.0)
NEUTROS ABS: 15 10*3/uL — AB (ref 1.5–6.5)
PLATELETS: 314 10*3/uL (ref 140–400)
RBC: 3.81 10*6/uL — ABNORMAL LOW (ref 4.20–5.82)
RDW: 21 % — AB (ref 11.0–14.6)
WBC: 15.8 10*3/uL — AB (ref 4.0–10.3)

## 2015-12-03 LAB — COMPREHENSIVE METABOLIC PANEL
ALBUMIN: 3.6 g/dL (ref 3.5–5.0)
ALK PHOS: 88 U/L (ref 40–150)
ALT: 12 U/L (ref 0–55)
ANION GAP: 7 meq/L (ref 3–11)
AST: 14 U/L (ref 5–34)
BILIRUBIN TOTAL: 0.48 mg/dL (ref 0.20–1.20)
BUN: 10.2 mg/dL (ref 7.0–26.0)
CO2: 35 mEq/L — ABNORMAL HIGH (ref 22–29)
Calcium: 8.9 mg/dL (ref 8.4–10.4)
Chloride: 97 mEq/L — ABNORMAL LOW (ref 98–109)
Creatinine: 0.9 mg/dL (ref 0.7–1.3)
EGFR: 87 mL/min/{1.73_m2} — AB (ref >90)
GLUCOSE: 139 mg/dL (ref 70–140)
POTASSIUM: 5 meq/L (ref 3.5–5.1)
SODIUM: 139 meq/L (ref 136–145)
TOTAL PROTEIN: 6.4 g/dL (ref 6.4–8.3)

## 2015-12-03 MED ORDER — ALBUTEROL SULFATE (2.5 MG/3ML) 0.083% IN NEBU
2.5000 mg | INHALATION_SOLUTION | RESPIRATORY_TRACT | Status: AC
  Administered 2015-12-03: 2.5 mg via RESPIRATORY_TRACT
  Filled 2015-12-03: qty 3

## 2015-12-03 MED ORDER — DEXTROSE 5 % IV SOLN: INTRAVENOUS | Status: DC

## 2015-12-03 MED ORDER — SODIUM CHLORIDE 0.9 % IV SOLN
750.0000 mg/m2 | Freq: Once | INTRAVENOUS | Status: AC
  Administered 2015-12-03: 1680 mg via INTRAVENOUS
  Filled 2015-12-03: qty 84

## 2015-12-03 MED ORDER — SODIUM CHLORIDE 0.9% FLUSH
10.0000 mL | INTRAVENOUS | Status: DC | PRN
  Administered 2015-12-03: 10 mL via INTRAVENOUS
  Filled 2015-12-03: qty 10

## 2015-12-03 MED ORDER — ACETAMINOPHEN 325 MG PO TABS
ORAL_TABLET | ORAL | Status: AC
  Filled 2015-12-03: qty 2

## 2015-12-03 MED ORDER — VINCRISTINE SULFATE CHEMO INJECTION 1 MG/ML
2.0000 mg | Freq: Once | INTRAVENOUS | Status: AC
  Administered 2015-12-03: 2 mg via INTRAVENOUS
  Filled 2015-12-03: qty 2

## 2015-12-03 MED ORDER — DOXORUBICIN HCL CHEMO IV INJECTION 2 MG/ML
50.0000 mg/m2 | Freq: Once | INTRAVENOUS | Status: AC
  Administered 2015-12-03: 112 mg via INTRAVENOUS
  Filled 2015-12-03: qty 56

## 2015-12-03 MED ORDER — ACETAMINOPHEN 325 MG PO TABS
650.0000 mg | ORAL_TABLET | Freq: Once | ORAL | Status: AC
  Administered 2015-12-03: 650 mg via ORAL

## 2015-12-03 MED ORDER — ALBUTEROL SULFATE (2.5 MG/3ML) 0.083% IN NEBU
INHALATION_SOLUTION | RESPIRATORY_TRACT | Status: AC
  Filled 2015-12-03: qty 3

## 2015-12-03 MED ORDER — SODIUM CHLORIDE 0.9 % IJ SOLN
10.0000 mL | INTRAMUSCULAR | Status: DC | PRN
Start: 2015-12-03 — End: 2015-12-03
  Administered 2015-12-03: 10 mL
  Filled 2015-12-03: qty 10

## 2015-12-03 MED ORDER — DIPHENHYDRAMINE HCL 25 MG PO CAPS
50.0000 mg | ORAL_CAPSULE | Freq: Once | ORAL | Status: AC
  Administered 2015-12-03: 50 mg via ORAL

## 2015-12-03 MED ORDER — SODIUM CHLORIDE 0.9 % IV SOLN
Freq: Once | INTRAVENOUS | Status: AC
  Administered 2015-12-03: 10:00:00 via INTRAVENOUS
  Filled 2015-12-03: qty 8

## 2015-12-03 MED ORDER — SODIUM CHLORIDE 0.9 % IV SOLN
Freq: Once | INTRAVENOUS | Status: AC
  Administered 2015-12-03: 10:00:00 via INTRAVENOUS

## 2015-12-03 MED ORDER — HEPARIN SOD (PORK) LOCK FLUSH 100 UNIT/ML IV SOLN
500.0000 [IU] | Freq: Once | INTRAVENOUS | Status: AC | PRN
  Administered 2015-12-03: 500 [IU]
  Filled 2015-12-03: qty 5

## 2015-12-03 MED ORDER — SODIUM CHLORIDE 0.9 % IV SOLN
375.0000 mg/m2 | Freq: Once | INTRAVENOUS | Status: AC
  Administered 2015-12-03: 900 mg via INTRAVENOUS
  Filled 2015-12-03: qty 20

## 2015-12-03 MED ORDER — DIPHENHYDRAMINE HCL 25 MG PO CAPS
ORAL_CAPSULE | ORAL | Status: AC
  Filled 2015-12-03: qty 2

## 2015-12-03 NOTE — Patient Instructions (Signed)
[facility] Discharge Instructions for Patients Receiving Chemotherapy  Today you received the following chemotherapy agents: adriamycin, vincristine, cytoxan, rituxan  To help prevent nausea and vomiting after your treatment, we encourage you to take your nausea medication.  Take it as often as prescribed.     If you develop nausea and vomiting that is not controlled by your nausea medication, call the clinic. If it is after clinic hours your family physician or the after hours number for the clinic or go to the Emergency Department.   BELOW ARE SYMPTOMS THAT SHOULD BE REPORTED IMMEDIATELY:  *FEVER GREATER THAN 100.5 F  *CHILLS WITH OR WITHOUT FEVER  NAUSEA AND VOMITING THAT IS NOT CONTROLLED WITH YOUR NAUSEA MEDICATION  *UNUSUAL SHORTNESS OF BREATH  *UNUSUAL BRUISING OR BLEEDING  TENDERNESS IN MOUTH AND THROAT WITH OR WITHOUT PRESENCE OF ULCERS  *URINARY PROBLEMS  *BOWEL PROBLEMS  UNUSUAL RASH Items with * indicate a potential emergency and should be followed up as soon as possible.  One of the nurses will contact you 24 hours after your treatment. Please let the nurse know about any problems that you may have experienced. Feel free to call the clinic you have any questions or concerns. The clinic phone number is [phone].   I have been informed and understand all the instructions given to me. I know to contact the clinic, my physician, or go to the Emergency Department if any problems should occur. I do not have any questions at this time, but understand that I may call the clinic during office hours   should I have any questions or need assistance in obtaining follow up care.    __________________________________________  _____________  __________ Signature of Patient or Authorized Representative            Date                   Time    __________________________________________ Nurse's Signature

## 2015-12-03 NOTE — Patient Instructions (Signed)

## 2015-12-03 NOTE — Progress Notes (Signed)
[place] Telephone:([phone]   Fax:(336) [place], [place] [address]  DIAGNOSIS: Stage III large B-cell non-Hodgkin lymphoma presenting with extensive lymphadenopathy involving the neck, chest, abdomen as well as splenomegaly diagnosed in December 2016.  PRIOR THERAPY:None  CURRENT THERAPY:Systemic chemotherapy with the CHOP/Rituxan every 3 weeks with Neulasta support. Status post 3 cycles.  INTERVAL HISTORY: [name] 65 y.o. male returns to the clinic today for follow-up visit accompanied by his wife. The patient is doing very well today except for the shortness breath secondary to COPD. He was seen recently by Dr. Chase Caller and has a change in his inhalers. He is expected to have pulmonary function test later this month. The patient is currently tolerating his chemotherapy with CHOP/Rituxan fairly well was no significant adverse effects. He denied having any significant fever or chills. He has no nausea or vomiting. He denied having any significant weight loss or night sweats. He denied having any significant chest pain, cough or hemoptysis. He had repeat CT scan of the chest, abdomen and pelvis performed recently and he is here for evaluation and discussion of his scan results before starting cycle #4.  MEDICAL HISTORY: Past Medical History  Diagnosis Date  . Streptococcal pneumonia [facility]) August 2009  . Congestive heart failure ([place])   . Gastrointestinal obstruction ([facility])     Ileus  . Pulmonary hypertension ([employer])   . Polycythemia   . Hypothyroid   . COPD (chronic obstructive pulmonary disease) ([place])   . Obesity   . Gout   . BPH (benign prostatic hyperplasia) 2011  . Depression   . Hypertension   . Lung cancer ([facility])   . NHL (non-Hodgkin's lymphoma) ([place]) 09/25/2015  . Encounter for antineoplastic chemotherapy 10/08/2015  . Drug-induced neutropenia ([place]) 10/08/2015    ALLERGIES:   has No Known Allergies.  MEDICATIONS:  Current Outpatient Prescriptions  Medication Sig Dispense Refill  . albuterol (PROVENTIL) (2.5 MG/3ML) 0.083% nebulizer solution INHALE 3 MILLILITERS (2.5 MG) BY NEBULIZATION ROUTE EVERY 8 HOURS AS NEEDED  3  . allopurinol (ZYLOPRIM) 100 MG tablet Take 1 tablet (100 mg total) by mouth 2 (two) times daily. 60 tablet 4  . ALPRAZolam (XANAX) 0.5 MG tablet Take 0.5 mg by mouth 2 (two) times daily as needed for anxiety.    [name] amLODipine (NORVASC) 5 MG tablet Take 1 tablet by mouth Daily.    [name] aspirin 325 MG tablet Take 325 mg by mouth daily.      . betamethasone dipropionate 0.05 % lotion Apply 1 drop topically 2 (two) times daily as needed.   3  . finasteride (PROSCAR) 5 MG tablet Take 5 mg by mouth daily.    [name] levothyroxine (SYNTHROID, LEVOTHROID) 150 MCG tablet Take 150 mcg by mouth daily.    [name] lidocaine-prilocaine (EMLA) cream Apply 1 application topically as needed. 30 g 0  . lisinopril (PRINIVIL,ZESTRIL) 10 MG tablet Take 1 tablet (10 mg total) by mouth daily. 30 tablet 11  . loratadine (CLARITIN) 10 MG tablet Take 10 mg by mouth daily. Reported on 11/12/2015    . magic mouthwash w/lidocaine SOLN Take 5 mLs by mouth 3 (three) times daily. 120 mL 1  . omeprazole (PRILOSEC) 20 MG capsule Take 1 tablet by mouth Twice daily.    [name] oxyCODONE-acetaminophen (PERCOCET) 10-325 MG tablet Take 1 tablet by mouth every 4 (four) hours as needed for pain.    [name]  predniSONE (DELTASONE) 20 MG tablet Take 1 tablet (20 mg total) by mouth daily with breakfast. 30 tablet 0  . predniSONE (DELTASONE) 50 MG tablet 2 tablets by mouth daily for 5 days starting with the first dose of chemotherapy every 3 weeks. 80 tablet 0  . prochlorperazine (COMPAZINE) 10 MG tablet Take 1 tablet (10 mg total) by mouth every 6 (six) hours as needed for nausea or vomiting. 60 tablet 0  . tiotropium (SPIRIVA) 18 MCG inhalation capsule Place 1 capsule (18 mcg total) into inhaler and inhale daily. 30  capsule 6   No current facility-administered medications for this visit.   Facility-Administered Medications Ordered in Other Visits  Medication Dose Route Frequency Provider Last Rate Last Dose  . sodium chloride flush (NS) 0.9 % injection 10 mL  10 mL Intravenous PRN Curt Bears, MD   10 mL at 12/03/15 0254    SURGICAL HISTORY:  Past Surgical History  Procedure Laterality Date  . Appendectomy    . Back surgery    . Lung cancer surgery  2011  . Cardioversion  11/27/2011    Procedure: CARDIOVERSION;  Surgeon: Thayer Headings, MD;  Location[facility] ENDOSCOPY;  Service: Cardiovascular;  Laterality: N/A;    REVIEW OF SYSTEMS:  Constitutional: positive for fatigue Eyes: negative Ears, nose, mouth, throat, and face: negative Respiratory: positive for dyspnea on exertion Cardiovascular: negative Gastrointestinal: negative Genitourinary:negative Integument/breast: negative Hematologic/lymphatic: negative Musculoskeletal:negative Neurological: negative Behavioral/Psych: negative Endocrine: negative Allergic/Immunologic: negative   PHYSICAL EXAMINATION: General appearance: alert, cooperative and no distress Head: Normocephalic, without obvious abnormality, atraumatic Neck: no adenopathy, no JVD, supple, symmetrical, trachea midline and thyroid not enlarged, symmetric, no tenderness/mass/nodules Lymph nodes: Cervical, supraclavicular, and axillary nodes normal. Resp: wheezes bilaterally Back: symmetric, no curvature. ROM normal. No CVA tenderness. Cardio: regular rate and rhythm, S1, S2 normal, no murmur, click, rub or gallop GI: soft, non-tender; bowel sounds normal; no masses,  no organomegaly Extremities: extremities normal, atraumatic, no cyanosis or edema  ECOG PERFORMANCE STATUS: 1 - Symptomatic but completely ambulatory  There were no vitals taken for this visit.  LABORATORY DATA: Lab Results  Component Value Date   WBC 15.8* 12/03/2015   HGB 11.0* 12/03/2015   HCT  34.8* 12/03/2015   MCV 91.4 12/03/2015   PLT 314 12/03/2015      Chemistry      Component Value Date/Time   NA 142 11/26/2015 1114   NA 143 10/03/2015 1204   K 4.5 11/26/2015 1114   K 4.9 10/03/2015 1204   CL 103 10/03/2015 1204   CO2 35* 11/26/2015 1114   CO2 30 10/03/2015 1204   BUN 11.3 11/26/2015 1114   BUN 25* 10/03/2015 1204   CREATININE 0.9 11/26/2015 1114   CREATININE 0.82 10/03/2015 1204      Component Value Date/Time   CALCIUM 8.9 11/26/2015 1114   CALCIUM 9.0 10/03/2015 1204   ALKPHOS 100 11/26/2015 1114   ALKPHOS 72 10/23/2010 0358   AST 10 11/26/2015 1114   AST 14 10/23/2010 0358   ALT 6 11/26/2015 1114   ALT 17 10/23/2010 0358   BILITOT 0.37 11/26/2015 1114   BILITOT 0.4 10/23/2010 0358       RADIOGRAPHIC STUDIES: Ct Chest W Contrast  12/02/2015  CLINICAL DATA:  65 year old male with a history of left upper lobe lung adenocarcinoma status post wedge resection 09/04/2010, diagnosed non-Hodgkin's (high-grade B-cell) lymphoma on 09/18/2015 left retroperitoneal node biopsy. Patient presents for restaging with ongoing chemotherapy. EXAM: CT CHEST, ABDOMEN, AND PELVIS WITH CONTRAST  TECHNIQUE: Multidetector CT imaging of the chest, abdomen and pelvis was performed following the standard protocol during bolus administration of intravenous contrast. CONTRAST:  133m OMNIPAQUE IOHEXOL 300 MG/ML  SOLN COMPARISON:  09/11/2015 PET-CT. FINDINGS: CT CHEST Mediastinum/Nodes: Normal heart size. No pericardial fluid/thickening. Coronary atherosclerosis. Right internal jugular MediPort terminates in the lower third of the superior vena cava. Atherosclerotic nonaneurysmal thoracic aorta. Stable dilated main pulmonary artery (4.0 cm diameter). No central pulmonary emboli. Thyroid appears atrophic. Normal esophagus. No axillary adenopathy. Significantly decreased mediastinal lymphadenopathy in the bilateral paratracheal, prevascular and subcarinal chains. For example a mildly enlarged  1.0 cm right paratracheal node (series 2/ image 25) is decreased from 2.7 cm. A mildly enlarged 1.4 cm subcarinal node (series 2/ image 37) is decreased from 2.7 cm. A mildly enlarged 1.0 cm prevascular node (series 2/ image 26) is decreased from 2.7 cm. No residual pathologically enlarged hilar nodes. Lungs/Pleura: No pneumothorax. No pleural effusion. Status post left upper lobe wedge resection. Curvilinear parenchymal band with associated mild distortion in the posterior right upper lobe (series 5/ image 24) is stable since 08/30/2012, in keeping with focal scarring. Additional stable parenchymal bands in the right lower lobe, in keeping with chronic scarring. Right lower lobe 3 mm pulmonary nodule (series 5/ image 42) and posterior left lower lobe 3 mm pulmonary nodule (series 5/ image 45) are stable since 08/30/2012 and benign. No acute consolidative airspace disease or new significant pulmonary nodules. Stable mosaic attenuation throughout the lungs, probably due to mosaic perfusion given the dilated pulmonary artery. Stable calcified right-sided pleural plaques. Musculoskeletal:  No aggressive appearing focal osseous lesions. CT ABDOMEN AND PELVIS Hepatobiliary: Stable mild hepatomegaly. No liver mass. Normal gallbladder with no radiopaque cholelithiasis. No biliary ductal dilatation. Pancreas: Normal, with no mass or duct dilation. Spleen: Mild splenomegaly, decreased, with approximate splenic volume of 704 cc (10.6 cm craniocaudal by 12.9 x 9.9 cm transverse dimensions), previous splenic volume 891 cc. Hypodense 5.7 x 4.3 cm mass in the inferior spleen (series 2/image 73), previously 6.8 x 4.6 cm, mildly decreased. No new splenic mass. Adrenals/Urinary Tract: Normal adrenals. No hydronephrosis. Nonobstructing 6 mm stone in the lower right kidney. Nonobstructing 3 mm stone in the upper right kidney. Nonobstructing 2 mm stone in the interpolar left kidney. Simple 4.3 cm renal cyst in the lateral interpolar  right kidney. Simple 8.5 cm renal cyst in the lower left kidney. Additional subcentimeter hypodense lesions in both kidneys are too small to characterize. Normal bladder. Stomach/Bowel: Grossly normal stomach. Normal caliber small bowel with no small bowel wall thickening. Status post laminectomy. Minimal sigmoid diverticulosis, with no large bowel wall thickening or pericolonic fat stranding. Oral contrast progresses to the splenic flexure of the colon. Vascular/Lymphatic: Atherosclerotic nonaneurysmal abdominal aorta. Patent portal, splenic, hepatic and renal veins. Abdominopelvic lymphadenopathy is significantly decreased. There is mild residual left para-aortic and aortocaval lymphadenopathy. For example a mildly enlarged 1.1 cm upper left para-aortic node (series 2/image 70) is decreased from 3.8 cm. A mildly enlarged 1.2 cm left para-aortic node (series 2/ image 77) is decreased from 3.3 cm. A mildly enlarged 1.0 cm aortocaval node (series 2/image 70) is decreased from 3.0 cm. No residual pathologically enlarged pelvic lymph nodes. Reproductive: Normal size prostate with nonspecific internal prostatic calcifications. Other: No pneumoperitoneum, ascites or focal fluid collection. Musculoskeletal: No aggressive appearing focal osseous lesions. Moderate degenerative changes in the lumbar spine. IMPRESSION: 1. Significant interval treatment response. Mild residual mediastinal and retroperitoneal lymphadenopathy, significantly decreased. 2. Mild splenomegaly, decreased. Hypodense inferior  splenic mass is mildly decreased. 3. Left upper lobe wedge resection, with no evidence of local tumor recurrence. 4. Additional findings include coronary atherosclerosis, calcified right pleural plaques, dilated pulmonary artery suggesting chronic pulmonary arterial hypertension and nonobstructing bilateral renal stones. Electronically Signed   By: Ilona Sorrel M.D.   On: 12/02/2015 09:50   Ct Abdomen Pelvis W  Contrast  12/02/2015  CLINICAL DATA:  65 year old male with a history of left upper lobe lung adenocarcinoma status post wedge resection 09/04/2010, diagnosed non-Hodgkin's (high-grade B-cell) lymphoma on 09/18/2015 left retroperitoneal node biopsy. Patient presents for restaging with ongoing chemotherapy. EXAM: CT CHEST, ABDOMEN, AND PELVIS WITH CONTRAST TECHNIQUE: Multidetector CT imaging of the chest, abdomen and pelvis was performed following the standard protocol during bolus administration of intravenous contrast. CONTRAST:  161m OMNIPAQUE IOHEXOL 300 MG/ML  SOLN COMPARISON:  09/11/2015 PET-CT. FINDINGS: CT CHEST Mediastinum/Nodes: Normal heart size. No pericardial fluid/thickening. Coronary atherosclerosis. Right internal jugular MediPort terminates in the lower third of the superior vena cava. Atherosclerotic nonaneurysmal thoracic aorta. Stable dilated main pulmonary artery (4.0 cm diameter). No central pulmonary emboli. Thyroid appears atrophic. Normal esophagus. No axillary adenopathy. Significantly decreased mediastinal lymphadenopathy in the bilateral paratracheal, prevascular and subcarinal chains. For example a mildly enlarged 1.0 cm right paratracheal node (series 2/ image 25) is decreased from 2.7 cm. A mildly enlarged 1.4 cm subcarinal node (series 2/ image 37) is decreased from 2.7 cm. A mildly enlarged 1.0 cm prevascular node (series 2/ image 26) is decreased from 2.7 cm. No residual pathologically enlarged hilar nodes. Lungs/Pleura: No pneumothorax. No pleural effusion. Status post left upper lobe wedge resection. Curvilinear parenchymal band with associated mild distortion in the posterior right upper lobe (series 5/ image 24) is stable since 08/30/2012, in keeping with focal scarring. Additional stable parenchymal bands in the right lower lobe, in keeping with chronic scarring. Right lower lobe 3 mm pulmonary nodule (series 5/ image 42) and posterior left lower lobe 3 mm pulmonary nodule  (series 5/ image 45) are stable since 08/30/2012 and benign. No acute consolidative airspace disease or new significant pulmonary nodules. Stable mosaic attenuation throughout the lungs, probably due to mosaic perfusion given the dilated pulmonary artery. Stable calcified right-sided pleural plaques. Musculoskeletal:  No aggressive appearing focal osseous lesions. CT ABDOMEN AND PELVIS Hepatobiliary: Stable mild hepatomegaly. No liver mass. Normal gallbladder with no radiopaque cholelithiasis. No biliary ductal dilatation. Pancreas: Normal, with no mass or duct dilation. Spleen: Mild splenomegaly, decreased, with approximate splenic volume of 704 cc (10.6 cm craniocaudal by 12.9 x 9.9 cm transverse dimensions), previous splenic volume 891 cc. Hypodense 5.7 x 4.3 cm mass in the inferior spleen (series 2/image 73), previously 6.8 x 4.6 cm, mildly decreased. No new splenic mass. Adrenals/Urinary Tract: Normal adrenals. No hydronephrosis. Nonobstructing 6 mm stone in the lower right kidney. Nonobstructing 3 mm stone in the upper right kidney. Nonobstructing 2 mm stone in the interpolar left kidney. Simple 4.3 cm renal cyst in the lateral interpolar right kidney. Simple 8.5 cm renal cyst in the lower left kidney. Additional subcentimeter hypodense lesions in both kidneys are too small to characterize. Normal bladder. Stomach/Bowel: Grossly normal stomach. Normal caliber small bowel with no small bowel wall thickening. Status post laminectomy. Minimal sigmoid diverticulosis, with no large bowel wall thickening or pericolonic fat stranding. Oral contrast progresses to the splenic flexure of the colon. Vascular/Lymphatic: Atherosclerotic nonaneurysmal abdominal aorta. Patent portal, splenic, hepatic and renal veins. Abdominopelvic lymphadenopathy is significantly decreased. There is mild residual left para-aortic and aortocaval  lymphadenopathy. For example a mildly enlarged 1.1 cm upper left para-aortic node (series 2/image  70) is decreased from 3.8 cm. A mildly enlarged 1.2 cm left para-aortic node (series 2/ image 77) is decreased from 3.3 cm. A mildly enlarged 1.0 cm aortocaval node (series 2/image 70) is decreased from 3.0 cm. No residual pathologically enlarged pelvic lymph nodes. Reproductive: Normal size prostate with nonspecific internal prostatic calcifications. Other: No pneumoperitoneum, ascites or focal fluid collection. Musculoskeletal: No aggressive appearing focal osseous lesions. Moderate degenerative changes in the lumbar spine. IMPRESSION: 1. Significant interval treatment response. Mild residual mediastinal and retroperitoneal lymphadenopathy, significantly decreased. 2. Mild splenomegaly, decreased. Hypodense inferior splenic mass is mildly decreased. 3. Left upper lobe wedge resection, with no evidence of local tumor recurrence. 4. Additional findings include coronary atherosclerosis, calcified right pleural plaques, dilated pulmonary artery suggesting chronic pulmonary arterial hypertension and nonobstructing bilateral renal stones. Electronically Signed   By: Ilona Sorrel M.D.   On: 12/02/2015 09:50    ASSESSMENT AND PLAN: This is a very pleasant 65 years old white male diagnosed with a stage III large B-cell non-Hodgkin lymphoma and currently undergoing systemic chemotherapy with CHOP/Rituxan status post 3 cycles. He tolerated the first 3 cycles of his treatment fairly well with no significant adverse effects.  The recent CT scan of the chest, abdomen and pelvis showed significant improvement of his disease. I discussed the scan results and showed the images to the patient and his wife today. I recommended for him to continue his current systemic chemotherapy with CHOP/Rituxan and the patient will receive cycle #4 today. He will come back for follow-up visit in 3 weeks for evaluation before starting cycle #5.  For COPD, he is followed by Dr. Chase Caller and he is expected to have repeat pulmonary function  test later this month.  The patient was advised to call immediately if he has any concerning symptoms in the interval. The patient voices understanding of current disease status and treatment options and is in agreement with the current care plan.  All questions were answered. The patient knows to call the clinic with any problems, questions or concerns. We can certainly see the patient much sooner if necessary.  Disclaimer: This note was dictated with voice recognition software. Similar sounding words can inadvertently be transcribed and may not be corrected upon review.

## 2015-12-05 ENCOUNTER — Ambulatory Visit (HOSPITAL_BASED_OUTPATIENT_CLINIC_OR_DEPARTMENT_OTHER): Payer: PPO

## 2015-12-05 VITALS — BP 140/79 | HR 79 | Temp 98.7°F | Resp 22

## 2015-12-05 DIAGNOSIS — Z5189 Encounter for other specified aftercare: Secondary | ICD-10-CM | POA: Diagnosis not present

## 2015-12-05 DIAGNOSIS — C8258 Diffuse follicle center lymphoma, lymph nodes of multiple sites: Secondary | ICD-10-CM | POA: Diagnosis not present

## 2015-12-05 MED ORDER — PEGFILGRASTIM INJECTION 6 MG/0.6ML ~~LOC~~
6.0000 mg | PREFILLED_SYRINGE | Freq: Once | SUBCUTANEOUS | Status: AC
  Administered 2015-12-05: 6 mg via SUBCUTANEOUS
  Filled 2015-12-05: qty 0.6

## 2015-12-09 DIAGNOSIS — F419 Anxiety disorder, unspecified: Secondary | ICD-10-CM | POA: Diagnosis not present

## 2015-12-09 DIAGNOSIS — C859 Non-Hodgkin lymphoma, unspecified: Secondary | ICD-10-CM | POA: Diagnosis not present

## 2015-12-09 DIAGNOSIS — R5383 Other fatigue: Secondary | ICD-10-CM | POA: Diagnosis not present

## 2015-12-09 DIAGNOSIS — J449 Chronic obstructive pulmonary disease, unspecified: Secondary | ICD-10-CM | POA: Diagnosis not present

## 2015-12-09 DIAGNOSIS — K219 Gastro-esophageal reflux disease without esophagitis: Secondary | ICD-10-CM | POA: Diagnosis not present

## 2015-12-09 DIAGNOSIS — G8929 Other chronic pain: Secondary | ICD-10-CM | POA: Diagnosis not present

## 2015-12-09 DIAGNOSIS — E039 Hypothyroidism, unspecified: Secondary | ICD-10-CM | POA: Diagnosis not present

## 2015-12-09 DIAGNOSIS — G894 Chronic pain syndrome: Secondary | ICD-10-CM | POA: Diagnosis not present

## 2015-12-10 ENCOUNTER — Other Ambulatory Visit (HOSPITAL_BASED_OUTPATIENT_CLINIC_OR_DEPARTMENT_OTHER): Payer: PPO

## 2015-12-10 DIAGNOSIS — C8338 Diffuse large B-cell lymphoma, lymph nodes of multiple sites: Secondary | ICD-10-CM | POA: Diagnosis not present

## 2015-12-10 LAB — COMPREHENSIVE METABOLIC PANEL
ANION GAP: 5 meq/L (ref 3–11)
AST: <7 U/L (ref 5–34)
Albumin: 3.3 g/dL — ABNORMAL LOW (ref 3.5–5.0)
Alkaline Phosphatase: 98 U/L (ref 40–150)
BUN: 23.6 mg/dL (ref 7.0–26.0)
CHLORIDE: 99 meq/L (ref 98–109)
CO2: 36 meq/L — AB (ref 22–29)
CREATININE: 0.9 mg/dL (ref 0.7–1.3)
Calcium: 8.8 mg/dL (ref 8.4–10.4)
EGFR: 85 mL/min/{1.73_m2} — ABNORMAL LOW (ref >90)
Glucose: 118 mg/dl (ref 70–140)
POTASSIUM: 5.3 meq/L — AB (ref 3.5–5.1)
Sodium: 140 mEq/L (ref 136–145)
Total Bilirubin: 0.51 mg/dL (ref 0.20–1.20)
Total Protein: 5.9 g/dL — ABNORMAL LOW (ref 6.4–8.3)

## 2015-12-10 LAB — CBC WITH DIFFERENTIAL/PLATELET
BASO%: 0.8 % (ref 0.0–2.0)
Basophils Absolute: 0 10*3/uL (ref 0.0–0.1)
EOS ABS: 0.1 10*3/uL (ref 0.0–0.5)
EOS%: 3.3 % (ref 0.0–7.0)
HEMATOCRIT: 29 % — AB (ref 38.4–49.9)
HGB: 9.3 g/dL — ABNORMAL LOW (ref 13.0–17.1)
LYMPH#: 0.3 10*3/uL — AB (ref 0.9–3.3)
LYMPH%: 11.7 % — ABNORMAL LOW (ref 14.0–49.0)
MCH: 29.6 pg (ref 27.2–33.4)
MCHC: 32.2 g/dL (ref 32.0–36.0)
MCV: 91.8 fL (ref 79.3–98.0)
MONO#: 0.2 10*3/uL (ref 0.1–0.9)
MONO%: 7.3 % (ref 0.0–14.0)
NEUT%: 76.9 % — ABNORMAL HIGH (ref 39.0–75.0)
NEUTROS ABS: 1.9 10*3/uL (ref 1.5–6.5)
PLATELETS: 110 10*3/uL — AB (ref 140–400)
RBC: 3.16 10*6/uL — ABNORMAL LOW (ref 4.20–5.82)
RDW: 18.5 % — ABNORMAL HIGH (ref 11.0–14.6)
WBC: 2.5 10*3/uL — AB (ref 4.0–10.3)

## 2015-12-17 ENCOUNTER — Other Ambulatory Visit (HOSPITAL_BASED_OUTPATIENT_CLINIC_OR_DEPARTMENT_OTHER): Payer: PPO

## 2015-12-17 DIAGNOSIS — C8338 Diffuse large B-cell lymphoma, lymph nodes of multiple sites: Secondary | ICD-10-CM

## 2015-12-17 LAB — CBC WITH DIFFERENTIAL/PLATELET
BASO%: 1 % (ref 0.0–2.0)
Basophils Absolute: 0.2 10*3/uL — ABNORMAL HIGH (ref 0.0–0.1)
EOS ABS: 0.1 10*3/uL (ref 0.0–0.5)
EOS%: 0.5 % (ref 0.0–7.0)
HEMATOCRIT: 32 % — AB (ref 38.4–49.9)
HGB: 10.2 g/dL — ABNORMAL LOW (ref 13.0–17.1)
LYMPH#: 0.7 10*3/uL — AB (ref 0.9–3.3)
LYMPH%: 4.5 % — AB (ref 14.0–49.0)
MCH: 29.1 pg (ref 27.2–33.4)
MCHC: 31.8 g/dL — ABNORMAL LOW (ref 32.0–36.0)
MCV: 91.4 fL (ref 79.3–98.0)
MONO#: 1.1 10*3/uL — AB (ref 0.1–0.9)
MONO%: 6.6 % (ref 0.0–14.0)
NEUT%: 87.4 % — ABNORMAL HIGH (ref 39.0–75.0)
NEUTROS ABS: 14.5 10*3/uL — AB (ref 1.5–6.5)
PLATELETS: 200 10*3/uL (ref 140–400)
RBC: 3.51 10*6/uL — AB (ref 4.20–5.82)
RDW: 18.4 % — ABNORMAL HIGH (ref 11.0–14.6)
WBC: 16.5 10*3/uL — AB (ref 4.0–10.3)

## 2015-12-17 LAB — COMPREHENSIVE METABOLIC PANEL
ALBUMIN: 3.5 g/dL (ref 3.5–5.0)
ALK PHOS: 98 U/L (ref 40–150)
ALT: 13 U/L (ref 0–55)
ANION GAP: 6 meq/L (ref 3–11)
AST: 15 U/L (ref 5–34)
BILIRUBIN TOTAL: 0.35 mg/dL (ref 0.20–1.20)
BUN: 14.1 mg/dL (ref 7.0–26.0)
CALCIUM: 9.3 mg/dL (ref 8.4–10.4)
CO2: 38 meq/L — AB (ref 22–29)
CREATININE: 1.2 mg/dL (ref 0.7–1.3)
Chloride: 98 mEq/L (ref 98–109)
EGFR: 62 mL/min/{1.73_m2} — AB (ref >90)
Glucose: 110 mg/dl (ref 70–140)
Potassium: 5.5 mEq/L — ABNORMAL HIGH (ref 3.5–5.1)
Sodium: 141 mEq/L (ref 136–145)
TOTAL PROTEIN: 6.5 g/dL (ref 6.4–8.3)

## 2015-12-18 ENCOUNTER — Encounter: Payer: Self-pay | Admitting: Adult Health

## 2015-12-18 ENCOUNTER — Ambulatory Visit (INDEPENDENT_AMBULATORY_CARE_PROVIDER_SITE_OTHER): Payer: PPO | Admitting: Adult Health

## 2015-12-18 ENCOUNTER — Ambulatory Visit (HOSPITAL_COMMUNITY)
Admission: RE | Admit: 2015-12-18 | Discharge: 2015-12-18 | Disposition: A | Discharge status: Home / Self Care | Payer: PPO | Source: Ambulatory Visit | Attending: Internal Medicine | Admitting: Internal Medicine

## 2015-12-18 VITALS — BP 114/70 | HR 96 | Temp 98.7°F | Ht 72.0 in | Wt 217.0 lb

## 2015-12-18 DIAGNOSIS — J449 Chronic obstructive pulmonary disease, unspecified: Secondary | ICD-10-CM

## 2015-12-18 DIAGNOSIS — J9611 Chronic respiratory failure with hypoxia: Secondary | ICD-10-CM | POA: Diagnosis not present

## 2015-12-18 DIAGNOSIS — C3490 Malignant neoplasm of unspecified part of unspecified bronchus or lung: Secondary | ICD-10-CM | POA: Diagnosis not present

## 2015-12-18 LAB — PULMONARY FUNCTION TEST
DL/VA % pred: 65 %
DL/VA: 3.11 ml/min/mmHg/L
DLCO COR % PRED: 39 %
DLCO COR: 13.96 ml/min/mmHg
DLCO UNC: 11.85 ml/min/mmHg
DLCO unc % pred: 33 %
FEF 25-75 POST: 0.74 L/s
FEF 25-75 Pre: 0.53 L/sec
FEF2575-%Change-Post: 40 %
FEF2575-%PRED-POST: 25 %
FEF2575-%PRED-PRE: 18 %
FEV1-%CHANGE-POST: 8 %
FEV1-%PRED-POST: 45 %
FEV1-%PRED-PRE: 41 %
FEV1-Post: 1.66 L
FEV1-Pre: 1.53 L
FEV1FVC-%Change-Post: 0 %
FEV1FVC-%PRED-PRE: 74 %
FEV6-%Change-Post: 7 %
FEV6-%PRED-POST: 58 %
FEV6-%Pred-Pre: 54 %
FEV6-POST: 2.73 L
FEV6-Pre: 2.54 L
FEV6FVC-%CHANGE-POST: 0 %
FEV6FVC-%PRED-POST: 96 %
FEV6FVC-%Pred-Pre: 97 %
FVC-%Change-Post: 8 %
FVC-%PRED-PRE: 55 %
FVC-%Pred-Post: 60 %
FVC-PRE: 2.75 L
FVC-Post: 2.99 L
POST FEV1/FVC RATIO: 56 %
PRE FEV1/FVC RATIO: 56 %
PRE FEV6/FVC RATIO: 92 %
Post FEV6/FVC ratio: 91 %
RV % pred: 143 %
RV: 3.54 L
TLC % PRED: 84 %
TLC: 6.25 L

## 2015-12-18 MED ORDER — ALBUTEROL SULFATE (2.5 MG/3ML) 0.083% IN NEBU
2.5000 mg | INHALATION_SOLUTION | Freq: Once | RESPIRATORY_TRACT | Status: AC
  Administered 2015-12-18: 2.5 mg via RESPIRATORY_TRACT

## 2015-12-18 NOTE — Patient Instructions (Signed)
Continue on t Spiriva daily  Continue on  oxygen 2l/m  Follow up Dr. Chase Caller in 3 months and As needed   Please contact office for sooner follow up if symptoms do not improve or worsen or seek emergency care

## 2015-12-19 NOTE — Progress Notes (Signed)
Subjective:    Patient ID: [name], male    DOB: 08-Dec-1950, 65 y.o.   MRN: [id number]  HPI 65 yo former smoker with COPD GOLD II , MM genotype and previous lung cancer 2011   TEST /Event  # S/p LUL wedge resection for stage 1Asquamous cell lung cancer [name]) December 2011.  - dec 2012 CT chest: no recurrence - cxr May 2013: clear # Recurrent RML and RLL pna due to dysphagia/aspoiration - in Jan and Feb 2012 with subsequent clearance April 2012 cxr, Dec 2012 CT - Oct 17, 2011 - [facility] report as bilateral pna -> No evidence March 2013 [place] cxr  CT chest 2013 , no evidence of local recurrent or mets , stable scarring in both lungs. Tree in bud in RUL ? Post inflammatory.  CT chest 08/28/2016>bulky lymphadenopathy in chest /upper abd with spenomegaly ? Lymphoma  PET scan 09/10/16 >Positive for extensive hypermetabolic adenopathy  CT chest 12/02/15 >sign interval tx response , mild residual lymphadenopathy , sign decreased no evid. Of local tumor recurrence LUL .    12/18/15  Follow up : COPD , Lung cancer and Lymphoma  Pt returns for 3 week follow up .  Recently dx Stage III large B-cell non-Hodgkin lymphoma presenting with extensive lymphadenopathy  He is undergoing chemo , followed by Dr. Earlie Server .  He says he feels tired from chemo but is tolerating ok.  Does have some sinus congestion and cough on occasion . Occasionally gets up some green mucus. No fever, chest pain,orthopnea or edema.  Had PFT today that shows severe airflow obstruction with FEV1 45%, ratio 56, FVC 60%, DLCO 33%.  We reviewed his test results .  He remains on Spiriva daily . On O2 at 2l/m .   Hx of lung cancer. S/p LUL wedge resection in 2011 , last CT chest 2013 w/ no reoccurrence.  Most recent CT chest on 12/02/15 shows sign interval tx response , mild residual lymphadenopathy , sign decreased no evid. Of local tumor recurrence LUL .     Denies any hemoptysis, chest pain, orthopnea, PND, or increased leg swelling.      Past Medical History  Diagnosis Date  . Streptococcal pneumonia [facility]) August 2009  . Congestive heart failure ([place])   . Gastrointestinal obstruction ([facility])     Ileus  . Pulmonary hypertension ([place])   . Polycythemia   . Hypothyroid   . COPD (chronic obstructive pulmonary disease) ([place])   . Obesity   . Gout   . BPH (benign prostatic hyperplasia) 2011  . Depression   . Hypertension   . Lung cancer ([place])   . NHL (non-Hodgkin's lymphoma) ([place]) 09/25/2015  . Encounter for antineoplastic chemotherapy 10/08/2015  . Drug-induced neutropenia ([place]) 10/08/2015   Current Outpatient Prescriptions on File Prior to Visit  Medication Sig Dispense Refill  . albuterol (PROVENTIL) (2.5 MG/3ML) 0.083% nebulizer solution INHALE 3 MILLILITERS (2.5 MG) BY NEBULIZATION ROUTE EVERY 8 HOURS AS NEEDED  3  . allopurinol (ZYLOPRIM) 100 MG tablet Take 1 tablet (100 mg total) by mouth 2 (two) times daily. 60 tablet 4  . amLODipine (NORVASC) 5 MG tablet Take 1 tablet by mouth Daily.    [name] aspirin 325 MG tablet Take 325 mg by mouth daily.      . betamethasone dipropionate 0.05 % lotion Apply 1 drop topically 2 (two) times daily as needed.   3  . finasteride (PROSCAR) 5 MG tablet Take 5 mg by mouth daily.    [name]  levothyroxine (SYNTHROID, LEVOTHROID) 150 MCG tablet Take 150 mcg by mouth daily.    [name] lidocaine-prilocaine (EMLA) cream Apply 1 application topically as needed. 30 g 0  . lisinopril (PRINIVIL,ZESTRIL) 10 MG tablet Take 1 tablet (10 mg total) by mouth daily. 30 tablet 11  . loratadine (CLARITIN) 10 MG tablet Take 10 mg by mouth daily. Reported on 11/12/2015    . magic mouthwash w/lidocaine SOLN Take 5 mLs by mouth 3 (three) times daily. 120 mL 1  . omeprazole (PRILOSEC) 20 MG capsule Take 1 tablet by mouth Twice daily.    [name] oxyCODONE-acetaminophen (PERCOCET) 10-325 MG tablet Take 1 tablet by mouth every 4 (four) hours as  needed for pain.    . predniSONE (DELTASONE) 20 MG tablet Take 1 tablet (20 mg total) by mouth daily with breakfast. 30 tablet 0  . predniSONE (DELTASONE) 50 MG tablet 2 tablets by mouth daily for 5 days starting with the first dose of chemotherapy every 3 weeks. 80 tablet 0  . prochlorperazine (COMPAZINE) 10 MG tablet Take 1 tablet (10 mg total) by mouth every 6 (six) hours as needed for nausea or vomiting. 60 tablet 0  . tiotropium (SPIRIVA) 18 MCG inhalation capsule Place 1 capsule (18 mcg total) into inhaler and inhale daily. 30 capsule 6  . ALPRAZolam (XANAX) 0.5 MG tablet Take 0.5 mg by mouth 2 (two) times daily as needed for anxiety. Reported on 12/18/2015     No current facility-administered medications on file prior to visit.      Review of Systems Constitutional:   No  weight loss, night sweats,  Fevers, chills,  +fatigue, or  lassitude.  HEENT:   No headaches,  Difficulty swallowing,  Tooth/dental problems, or  Sore throat,                No sneezing, itching, ear ache,  +nasal congestion, post nasal drip,   CV:  No chest pain,  Orthopnea, PND, swelling in lower extremities, anasarca, dizziness, palpitations, syncope.   GI  No heartburn, indigestion, abdominal pain, nausea, vomiting, diarrhea, change in bowel habits, loss of appetite, bloody stools.   Resp:   No chest wall deformity  Skin: no rash or lesions.  GU: no dysuria, change in color of urine, no urgency or frequency.  No flank pain, no hematuria   MS:  No joint pain or swelling.  No decreased range of motion.  No back pain.  Psych:  No change in mood or affect. No depression or anxiety.  No memory loss.         Objective:   Physical Exam   Filed Vitals:   12/18/15 1143  BP: 114/70  Pulse: 96  Temp: 98.7 F (37.1 C)  TempSrc: Oral  Height: 6' (1.829 m)  Weight: 217 lb (98.431 kg)  SpO2: 94%    GEN: A/Ox3; pleasant , NAD, elderly obese  HEENT:  [place]/AT,  EACs-clear, TMs-wnl, NOSE-clear,  THROAT-clear, no lesions, no postnasal drip or exudate noted.   NECK:  Supple w/ fair ROM; no JVD; normal carotid impulses w/o bruits; no thyromegaly or nodules palpated; no lymphadenopathy.  RESP  Decrased BS in bases  No accessory muscle use, no dullness to percussion  CARD:  RRR, no m/r/g  , no peripheral edema, pulses intact, no cyanosis or clubbing.  GI:   Soft & nt; nml bowel sounds; no organomegaly or masses detected.  Musco: Warm bil, no deformities or joint swelling noted.   Neuro: alert, no focal deficits noted.  Skin: Warm, no lesions or rashes   Tammy Parrett NP-C  [facility]  12/18/15       Assessment & Plan:

## 2015-12-19 NOTE — Assessment & Plan Note (Signed)
Cont on O2 .  

## 2015-12-19 NOTE — Assessment & Plan Note (Signed)
No evidence of recurrence on CT chest 11/2015 .  Cont surveillance

## 2015-12-19 NOTE — Assessment & Plan Note (Signed)
Severe COPD with decline in FEV1 since 2013 currently undergoing chemo for lymphoma  He is currently stable . For now cont on Spiriva .  Discussed adding ICS/LABA combo . , for now he is stable , most likely will need to add to his therapy going forward.  Also may want to recheck O2 needs and spirometry after chemo is finished  If recurrent flare add ICS/LABA combo.   Plan  Continue on t Spiriva daily  Continue on  oxygen 2l/m  Follow up Dr. Chase Caller in 3 months and As needed   Please contact office for sooner follow up if symptoms do not improve or worsen or seek emergency care

## 2015-12-24 ENCOUNTER — Ambulatory Visit (HOSPITAL_BASED_OUTPATIENT_CLINIC_OR_DEPARTMENT_OTHER): Payer: PPO

## 2015-12-24 ENCOUNTER — Other Ambulatory Visit (HOSPITAL_BASED_OUTPATIENT_CLINIC_OR_DEPARTMENT_OTHER): Payer: PPO

## 2015-12-24 ENCOUNTER — Ambulatory Visit (HOSPITAL_BASED_OUTPATIENT_CLINIC_OR_DEPARTMENT_OTHER): Payer: PPO | Admitting: Internal Medicine

## 2015-12-24 ENCOUNTER — Encounter: Payer: Self-pay | Admitting: Internal Medicine

## 2015-12-24 VITALS — BP 122/57 | HR 73 | Temp 98.3°F | Resp 20

## 2015-12-24 VITALS — BP 109/65 | HR 90 | Temp 99.1°F | Resp 18 | Ht 72.0 in | Wt 219.4 lb

## 2015-12-24 DIAGNOSIS — J449 Chronic obstructive pulmonary disease, unspecified: Secondary | ICD-10-CM

## 2015-12-24 DIAGNOSIS — R0602 Shortness of breath: Secondary | ICD-10-CM

## 2015-12-24 DIAGNOSIS — C8258 Diffuse follicle center lymphoma, lymph nodes of multiple sites: Secondary | ICD-10-CM

## 2015-12-24 DIAGNOSIS — C8338 Diffuse large B-cell lymphoma, lymph nodes of multiple sites: Secondary | ICD-10-CM | POA: Diagnosis not present

## 2015-12-24 DIAGNOSIS — Z5112 Encounter for antineoplastic immunotherapy: Secondary | ICD-10-CM

## 2015-12-24 DIAGNOSIS — Z5111 Encounter for antineoplastic chemotherapy: Secondary | ICD-10-CM | POA: Diagnosis not present

## 2015-12-24 LAB — COMPREHENSIVE METABOLIC PANEL
ALT: <9 U/L (ref 0–55)
AST: 9 U/L (ref 5–34)
Albumin: 3.4 g/dL — ABNORMAL LOW (ref 3.5–5.0)
Alkaline Phosphatase: 64 U/L (ref 40–150)
Anion Gap: 9 mEq/L (ref 3–11)
BUN: 16.9 mg/dL (ref 7.0–26.0)
CHLORIDE: 103 meq/L (ref 98–109)
CO2: 33 meq/L — AB (ref 22–29)
Calcium: 9.2 mg/dL (ref 8.4–10.4)
Creatinine: 0.9 mg/dL (ref 0.7–1.3)
EGFR: 90 mL/min/{1.73_m2} — ABNORMAL LOW (ref >90)
GLUCOSE: 109 mg/dL (ref 70–140)
POTASSIUM: 4.6 meq/L (ref 3.5–5.1)
SODIUM: 145 meq/L (ref 136–145)
Total Bilirubin: 0.31 mg/dL (ref 0.20–1.20)
Total Protein: 6.3 g/dL — ABNORMAL LOW (ref 6.4–8.3)

## 2015-12-24 LAB — CBC WITH DIFFERENTIAL/PLATELET
BASO%: 0.8 % (ref 0.0–2.0)
BASOS ABS: 0.1 10*3/uL (ref 0.0–0.1)
EOS ABS: 0 10*3/uL (ref 0.0–0.5)
EOS%: 0.3 % (ref 0.0–7.0)
HCT: 30.8 % — ABNORMAL LOW (ref 38.4–49.9)
HGB: 9.8 g/dL — ABNORMAL LOW (ref 13.0–17.1)
LYMPH#: 1.5 10*3/uL (ref 0.9–3.3)
LYMPH%: 13 % — ABNORMAL LOW (ref 14.0–49.0)
MCH: 29.5 pg (ref 27.2–33.4)
MCHC: 31.9 g/dL — AB (ref 32.0–36.0)
MCV: 92.4 fL (ref 79.3–98.0)
MONO#: 1.2 10*3/uL — AB (ref 0.1–0.9)
MONO%: 10.3 % (ref 0.0–14.0)
NEUT#: 8.6 10*3/uL — ABNORMAL HIGH (ref 1.5–6.5)
NEUT%: 75.6 % — ABNORMAL HIGH (ref 39.0–75.0)
Platelets: 298 10*3/uL (ref 140–400)
RBC: 3.34 10*6/uL — ABNORMAL LOW (ref 4.20–5.82)
RDW: 17.6 % — AB (ref 11.0–14.6)
WBC: 11.4 10*3/uL — AB (ref 4.0–10.3)

## 2015-12-24 MED ORDER — DOXORUBICIN HCL CHEMO IV INJECTION 2 MG/ML
50.0000 mg/m2 | Freq: Once | INTRAVENOUS | Status: AC
Start: 2015-12-24 — End: 2015-12-24
  Administered 2015-12-24: 112 mg via INTRAVENOUS
  Filled 2015-12-24: qty 56

## 2015-12-24 MED ORDER — SODIUM CHLORIDE 0.9 % IV SOLN
375.0000 mg/m2 | Freq: Once | INTRAVENOUS | Status: AC
  Administered 2015-12-24: 900 mg via INTRAVENOUS
  Filled 2015-12-24: qty 80

## 2015-12-24 MED ORDER — DIPHENHYDRAMINE HCL 25 MG PO CAPS
ORAL_CAPSULE | ORAL | Status: AC
  Filled 2015-12-24: qty 2

## 2015-12-24 MED ORDER — CYCLOPHOSPHAMIDE CHEMO INJECTION 1 GM
750.0000 mg/m2 | Freq: Once | INTRAMUSCULAR | Status: AC
  Administered 2015-12-24: 1680 mg via INTRAVENOUS
  Filled 2015-12-24: qty 84

## 2015-12-24 MED ORDER — SODIUM CHLORIDE 0.9 % IJ SOLN
10.0000 mL | INTRAMUSCULAR | Status: DC | PRN
  Administered 2015-12-24: 10 mL
  Filled 2015-12-24: qty 10

## 2015-12-24 MED ORDER — SODIUM CHLORIDE 0.9 % IV SOLN
2.0000 mg | Freq: Once | INTRAVENOUS | Status: AC
  Administered 2015-12-24: 2 mg via INTRAVENOUS
  Filled 2015-12-24: qty 2

## 2015-12-24 MED ORDER — HEPARIN SOD (PORK) LOCK FLUSH 100 UNIT/ML IV SOLN
500.0000 [IU] | Freq: Once | INTRAVENOUS | Status: AC | PRN
  Administered 2015-12-24: 500 [IU]
  Filled 2015-12-24: qty 5

## 2015-12-24 MED ORDER — SODIUM CHLORIDE 0.9 % IV SOLN
Freq: Once | INTRAVENOUS | Status: AC
  Administered 2015-12-24: 09:00:00 via INTRAVENOUS
  Filled 2015-12-24: qty 8

## 2015-12-24 MED ORDER — ACETAMINOPHEN 325 MG PO TABS
650.0000 mg | ORAL_TABLET | Freq: Once | ORAL | Status: AC
  Administered 2015-12-24: 650 mg via ORAL

## 2015-12-24 MED ORDER — SODIUM CHLORIDE 0.9 % IV SOLN
Freq: Once | INTRAVENOUS | Status: AC
  Administered 2015-12-24: 09:00:00 via INTRAVENOUS

## 2015-12-24 MED ORDER — DIPHENHYDRAMINE HCL 25 MG PO CAPS
50.0000 mg | ORAL_CAPSULE | Freq: Once | ORAL | Status: AC
  Administered 2015-12-24: 50 mg via ORAL

## 2015-12-24 MED ORDER — ACETAMINOPHEN 325 MG PO TABS
ORAL_TABLET | ORAL | Status: AC
  Filled 2015-12-24: qty 2

## 2015-12-24 NOTE — Patient Instructions (Signed)
[facility] Discharge Instructions for Patients Receiving Chemotherapy  Today you received the following chemotherapy agents: adriamycin, vincristine, cytoxan, rituxan  To help prevent nausea and vomiting after your treatment, we encourage you to take your nausea medication.  Take it as often as prescribed.     If you develop nausea and vomiting that is not controlled by your nausea medication, call the clinic. If it is after clinic hours your family physician or the after hours number for the clinic or go to the Emergency Department.   BELOW ARE SYMPTOMS THAT SHOULD BE REPORTED IMMEDIATELY:  *FEVER GREATER THAN 100.5 F  *CHILLS WITH OR WITHOUT FEVER  NAUSEA AND VOMITING THAT IS NOT CONTROLLED WITH YOUR NAUSEA MEDICATION  *UNUSUAL SHORTNESS OF BREATH  *UNUSUAL BRUISING OR BLEEDING  TENDERNESS IN MOUTH AND THROAT WITH OR WITHOUT PRESENCE OF ULCERS  *URINARY PROBLEMS  *BOWEL PROBLEMS  UNUSUAL RASH Items with * indicate a potential emergency and should be followed up as soon as possible.  One of the nurses will contact you 24 hours after your treatment. Please let the nurse know about any problems that you may have experienced. Feel free to call the clinic you have any questions or concerns. The clinic phone number is [phone].   I have been informed and understand all the instructions given to me. I know to contact the clinic, my physician, or go to the Emergency Department if any problems should occur. I do not have any questions at this time, but understand that I may call the clinic during office hours   should I have any questions or need assistance in obtaining follow up care.    __________________________________________  _____________  __________ Signature of Patient or Authorized Representative            Date                   Time    __________________________________________ Nurse's Signature

## 2015-12-24 NOTE — Progress Notes (Signed)
[place] [phone]   Fax:(336) [place], [place] [address]  DIAGNOSIS: Stage III large B-cell non-Hodgkin lymphoma presenting with extensive lymphadenopathy involving the neck, chest, abdomen as well as splenomegaly diagnosed in December 2016.  PRIOR THERAPY:None  CURRENT THERAPY:Systemic chemotherapy with the CHOP/Rituxan every 3 weeks with Neulasta support. Status post 4 cycles.  INTERVAL HISTORY: [name] 65 y.o. male returns to the clinic today for follow-up visit accompanied by his wife. The patient is doing very well today except for the baseline shortness breath secondary to COPD which is actually better over the last few weeks. He is tolerating his chemotherapy with CHOP/Rituxan fairly well with no significant adverse effects. He denied having any significant fever or chills. He has no nausea or vomiting. He denied having any significant weight loss or night sweats. He denied having any significant chest pain, cough or hemoptysis. He is here today to start cycle #5.  MEDICAL HISTORY: Past Medical History  Diagnosis Date  . Streptococcal pneumonia [facility]) August 2009  . Congestive heart failure ([place])   . Gastrointestinal obstruction ([facility])     Ileus  . Pulmonary hypertension ([place])   . Polycythemia   . Hypothyroid   . COPD (chronic obstructive pulmonary disease) ([place])   . Obesity   . Gout   . BPH (benign prostatic hyperplasia) 2011  . Depression   . Hypertension   . Lung cancer ([place])   . NHL (non-Hodgkin's lymphoma) ([place]) 09/25/2015  . Encounter for antineoplastic chemotherapy 10/08/2015  . Drug-induced neutropenia ([place]) 10/08/2015    ALLERGIES:  has No Known Allergies.  MEDICATIONS:  Current Outpatient Prescriptions  Medication Sig Dispense Refill  . albuterol (PROVENTIL) (2.5 MG/3ML) 0.083% nebulizer solution INHALE 3 MILLILITERS (2.5 MG) BY NEBULIZATION ROUTE  EVERY 8 HOURS AS NEEDED  3  . allopurinol (ZYLOPRIM) 100 MG tablet Take 1 tablet (100 mg total) by mouth 2 (two) times daily. 60 tablet 4  . amLODipine (NORVASC) 5 MG tablet Take 1 tablet by mouth Daily.    [name] aspirin 325 MG tablet Take 325 mg by mouth daily.      . betamethasone dipropionate 0.05 % lotion Apply 1 drop topically 2 (two) times daily as needed.   3  . finasteride (PROSCAR) 5 MG tablet Take 5 mg by mouth daily.    [name] levothyroxine (SYNTHROID, LEVOTHROID) 150 MCG tablet Take 150 mcg by mouth daily.    [name] lidocaine-prilocaine (EMLA) cream Apply 1 application topically as needed. 30 g 0  . lisinopril (PRINIVIL,ZESTRIL) 10 MG tablet Take 1 tablet (10 mg total) by mouth daily. 30 tablet 11  . loratadine (CLARITIN) 10 MG tablet Take 10 mg by mouth daily. Reported on 11/12/2015    . magic mouthwash w/lidocaine SOLN Take 5 mLs by mouth 3 (three) times daily. 120 mL 1  . omeprazole (PRILOSEC) 20 MG capsule Take 1 tablet by mouth Twice daily.    [name] oxyCODONE-acetaminophen (PERCOCET) 10-325 MG tablet Take 1 tablet by mouth every 4 (four) hours as needed for pain.    . predniSONE (DELTASONE) 20 MG tablet Take 1 tablet (20 mg total) by mouth daily with breakfast. 30 tablet 0  . predniSONE (DELTASONE) 50 MG tablet 2 tablets by mouth daily for 5 days starting with the first dose of chemotherapy every 3 weeks. 80 tablet 0  . prochlorperazine (COMPAZINE) 10 MG tablet Take 1 tablet (10 mg total)  by mouth every 6 (six) hours as needed for nausea or vomiting. 60 tablet 0  . tiotropium (SPIRIVA) 18 MCG inhalation capsule Place 1 capsule (18 mcg total) into inhaler and inhale daily. 30 capsule 6   No current facility-administered medications for this visit.    SURGICAL HISTORY:  Past Surgical History  Procedure Laterality Date  . Appendectomy    . Back surgery    . Lung cancer surgery  2011  . Cardioversion  11/27/2011    Procedure: CARDIOVERSION;  Surgeon: Thayer Headings, MD;  Location: [facility];   Service: Cardiovascular;  Laterality: N/A;    REVIEW OF SYSTEMS:  A comprehensive review of systems was negative except for: Respiratory: positive for dyspnea on exertion   PHYSICAL EXAMINATION: General appearance: alert, cooperative and no distress Head: Normocephalic, without obvious abnormality, atraumatic Neck: no adenopathy, no JVD, supple, symmetrical, trachea midline and thyroid not enlarged, symmetric, no tenderness/mass/nodules Lymph nodes: Cervical, supraclavicular, and axillary nodes normal. Resp: wheezes bilaterally Back: symmetric, no curvature. ROM normal. No CVA tenderness. Cardio: regular rate and rhythm, S1, S2 normal, no murmur, click, rub or gallop GI: soft, non-tender; bowel sounds normal; no masses,  no organomegaly Extremities: extremities normal, atraumatic, no cyanosis or edema  ECOG PERFORMANCE STATUS: 1 - Symptomatic but completely ambulatory  Blood pressure 109/65, pulse 90, temperature 99.1 F (37.3 C), temperature source Oral, resp. rate 18, height 6' (1.829 m), weight 219 lb 6.4 oz (99.519 kg), SpO2 99 %.  LABORATORY DATA: Lab Results  Component Value Date   WBC 11.4* 12/24/2015   HGB 9.8* 12/24/2015   HCT 30.8* 12/24/2015   MCV 92.4 12/24/2015   PLT 298 12/24/2015      Chemistry      Component Value Date/Time   NA 141 12/17/2015 0833   NA 143 10/03/2015 1204   K 5.5* 12/17/2015 0833   K 4.9 10/03/2015 1204   CL 103 10/03/2015 1204   CO2 38* 12/17/2015 0833   CO2 30 10/03/2015 1204   BUN 14.1 12/17/2015 0833   BUN 25* 10/03/2015 1204   CREATININE 1.2 12/17/2015 0833   CREATININE 0.82 10/03/2015 1204      Component Value Date/Time   CALCIUM 9.3 12/17/2015 0833   CALCIUM 9.0 10/03/2015 1204   ALKPHOS 98 12/17/2015 0833   ALKPHOS 72 10/23/2010 0358   AST 15 12/17/2015 0833   AST 14 10/23/2010 0358   ALT 13 12/17/2015 0833   ALT 17 10/23/2010 0358   BILITOT 0.35 12/17/2015 0833   BILITOT 0.4 10/23/2010 0358       RADIOGRAPHIC  STUDIES: Ct Chest W Contrast  12/02/2015  CLINICAL DATA:  65 year old male with a history of left upper lobe lung adenocarcinoma status post wedge resection 09/04/2010, diagnosed non-Hodgkin's (high-grade B-cell) lymphoma on 09/18/2015 left retroperitoneal node biopsy. Patient presents for restaging with ongoing chemotherapy. EXAM: CT CHEST, ABDOMEN, AND PELVIS WITH CONTRAST TECHNIQUE: Multidetector CT imaging of the chest, abdomen and pelvis was performed following the standard protocol during bolus administration of intravenous contrast. CONTRAST:  157m OMNIPAQUE IOHEXOL 300 MG/ML  SOLN COMPARISON:  09/11/2015 PET-CT. FINDINGS: CT CHEST Mediastinum/Nodes: Normal heart size. No pericardial fluid/thickening. Coronary atherosclerosis. Right internal jugular MediPort terminates in the lower third of the superior vena cava. Atherosclerotic nonaneurysmal thoracic aorta. Stable dilated main pulmonary artery (4.0 cm diameter). No central pulmonary emboli. Thyroid appears atrophic. Normal esophagus. No axillary adenopathy. Significantly decreased mediastinal lymphadenopathy in the bilateral paratracheal, prevascular and subcarinal chains. For example a mildly enlarged 1.0 cm  right paratracheal node (series 2/ image 25) is decreased from 2.7 cm. A mildly enlarged 1.4 cm subcarinal node (series 2/ image 37) is decreased from 2.7 cm. A mildly enlarged 1.0 cm prevascular node (series 2/ image 26) is decreased from 2.7 cm. No residual pathologically enlarged hilar nodes. Lungs/Pleura: No pneumothorax. No pleural effusion. Status post left upper lobe wedge resection. Curvilinear parenchymal band with associated mild distortion in the posterior right upper lobe (series 5/ image 24) is stable since 08/30/2012, in keeping with focal scarring. Additional stable parenchymal bands in the right lower lobe, in keeping with chronic scarring. Right lower lobe 3 mm pulmonary nodule (series 5/ image 42) and posterior left lower lobe 3 mm  pulmonary nodule (series 5/ image 45) are stable since 08/30/2012 and benign. No acute consolidative airspace disease or new significant pulmonary nodules. Stable mosaic attenuation throughout the lungs, probably due to mosaic perfusion given the dilated pulmonary artery. Stable calcified right-sided pleural plaques. Musculoskeletal:  No aggressive appearing focal osseous lesions. CT ABDOMEN AND PELVIS Hepatobiliary: Stable mild hepatomegaly. No liver mass. Normal gallbladder with no radiopaque cholelithiasis. No biliary ductal dilatation. Pancreas: Normal, with no mass or duct dilation. Spleen: Mild splenomegaly, decreased, with approximate splenic volume of 704 cc (10.6 cm craniocaudal by 12.9 x 9.9 cm transverse dimensions), previous splenic volume 891 cc. Hypodense 5.7 x 4.3 cm mass in the inferior spleen (series 2/image 73), previously 6.8 x 4.6 cm, mildly decreased. No new splenic mass. Adrenals/Urinary Tract: Normal adrenals. No hydronephrosis. Nonobstructing 6 mm stone in the lower right kidney. Nonobstructing 3 mm stone in the upper right kidney. Nonobstructing 2 mm stone in the interpolar left kidney. Simple 4.3 cm renal cyst in the lateral interpolar right kidney. Simple 8.5 cm renal cyst in the lower left kidney. Additional subcentimeter hypodense lesions in both kidneys are too small to characterize. Normal bladder. Stomach/Bowel: Grossly normal stomach. Normal caliber small bowel with no small bowel wall thickening. Status post laminectomy. Minimal sigmoid diverticulosis, with no large bowel wall thickening or pericolonic fat stranding. Oral contrast progresses to the splenic flexure of the colon. Vascular/Lymphatic: Atherosclerotic nonaneurysmal abdominal aorta. Patent portal, splenic, hepatic and renal veins. Abdominopelvic lymphadenopathy is significantly decreased. There is mild residual left para-aortic and aortocaval lymphadenopathy. For example a mildly enlarged 1.1 cm upper left para-aortic  node (series 2/image 70) is decreased from 3.8 cm. A mildly enlarged 1.2 cm left para-aortic node (series 2/ image 77) is decreased from 3.3 cm. A mildly enlarged 1.0 cm aortocaval node (series 2/image 70) is decreased from 3.0 cm. No residual pathologically enlarged pelvic lymph nodes. Reproductive: Normal size prostate with nonspecific internal prostatic calcifications. Other: No pneumoperitoneum, ascites or focal fluid collection. Musculoskeletal: No aggressive appearing focal osseous lesions. Moderate degenerative changes in the lumbar spine. IMPRESSION: 1. Significant interval treatment response. Mild residual mediastinal and retroperitoneal lymphadenopathy, significantly decreased. 2. Mild splenomegaly, decreased. Hypodense inferior splenic mass is mildly decreased. 3. Left upper lobe wedge resection, with no evidence of local tumor recurrence. 4. Additional findings include coronary atherosclerosis, calcified right pleural plaques, dilated pulmonary artery suggesting chronic pulmonary arterial hypertension and nonobstructing bilateral renal stones. Electronically Signed   By: Ilona Sorrel M.D.   On: 12/02/2015 09:50   Ct Abdomen Pelvis W Contrast  12/02/2015  CLINICAL DATA:  65 year old male with a history of left upper lobe lung adenocarcinoma status post wedge resection 09/04/2010, diagnosed non-Hodgkin's (high-grade B-cell) lymphoma on 09/18/2015 left retroperitoneal node biopsy. Patient presents for restaging with ongoing chemotherapy. EXAM: CT  CHEST, ABDOMEN, AND PELVIS WITH CONTRAST TECHNIQUE: Multidetector CT imaging of the chest, abdomen and pelvis was performed following the standard protocol during bolus administration of intravenous contrast. CONTRAST:  138m OMNIPAQUE IOHEXOL 300 MG/ML  SOLN COMPARISON:  09/11/2015 PET-CT. FINDINGS: CT CHEST Mediastinum/Nodes: Normal heart size. No pericardial fluid/thickening. Coronary atherosclerosis. Right internal jugular MediPort terminates in the lower  third of the superior vena cava. Atherosclerotic nonaneurysmal thoracic aorta. Stable dilated main pulmonary artery (4.0 cm diameter). No central pulmonary emboli. Thyroid appears atrophic. Normal esophagus. No axillary adenopathy. Significantly decreased mediastinal lymphadenopathy in the bilateral paratracheal, prevascular and subcarinal chains. For example a mildly enlarged 1.0 cm right paratracheal node (series 2/ image 25) is decreased from 2.7 cm. A mildly enlarged 1.4 cm subcarinal node (series 2/ image 37) is decreased from 2.7 cm. A mildly enlarged 1.0 cm prevascular node (series 2/ image 26) is decreased from 2.7 cm. No residual pathologically enlarged hilar nodes. Lungs/Pleura: No pneumothorax. No pleural effusion. Status post left upper lobe wedge resection. Curvilinear parenchymal band with associated mild distortion in the posterior right upper lobe (series 5/ image 24) is stable since 08/30/2012, in keeping with focal scarring. Additional stable parenchymal bands in the right lower lobe, in keeping with chronic scarring. Right lower lobe 3 mm pulmonary nodule (series 5/ image 42) and posterior left lower lobe 3 mm pulmonary nodule (series 5/ image 45) are stable since 08/30/2012 and benign. No acute consolidative airspace disease or new significant pulmonary nodules. Stable mosaic attenuation throughout the lungs, probably due to mosaic perfusion given the dilated pulmonary artery. Stable calcified right-sided pleural plaques. Musculoskeletal:  No aggressive appearing focal osseous lesions. CT ABDOMEN AND PELVIS Hepatobiliary: Stable mild hepatomegaly. No liver mass. Normal gallbladder with no radiopaque cholelithiasis. No biliary ductal dilatation. Pancreas: Normal, with no mass or duct dilation. Spleen: Mild splenomegaly, decreased, with approximate splenic volume of 704 cc (10.6 cm craniocaudal by 12.9 x 9.9 cm transverse dimensions), previous splenic volume 891 cc. Hypodense 5.7 x 4.3 cm mass in  the inferior spleen (series 2/image 73), previously 6.8 x 4.6 cm, mildly decreased. No new splenic mass. Adrenals/Urinary Tract: Normal adrenals. No hydronephrosis. Nonobstructing 6 mm stone in the lower right kidney. Nonobstructing 3 mm stone in the upper right kidney. Nonobstructing 2 mm stone in the interpolar left kidney. Simple 4.3 cm renal cyst in the lateral interpolar right kidney. Simple 8.5 cm renal cyst in the lower left kidney. Additional subcentimeter hypodense lesions in both kidneys are too small to characterize. Normal bladder. Stomach/Bowel: Grossly normal stomach. Normal caliber small bowel with no small bowel wall thickening. Status post laminectomy. Minimal sigmoid diverticulosis, with no large bowel wall thickening or pericolonic fat stranding. Oral contrast progresses to the splenic flexure of the colon. Vascular/Lymphatic: Atherosclerotic nonaneurysmal abdominal aorta. Patent portal, splenic, hepatic and renal veins. Abdominopelvic lymphadenopathy is significantly decreased. There is mild residual left para-aortic and aortocaval lymphadenopathy. For example a mildly enlarged 1.1 cm upper left para-aortic node (series 2/image 70) is decreased from 3.8 cm. A mildly enlarged 1.2 cm left para-aortic node (series 2/ image 77) is decreased from 3.3 cm. A mildly enlarged 1.0 cm aortocaval node (series 2/image 70) is decreased from 3.0 cm. No residual pathologically enlarged pelvic lymph nodes. Reproductive: Normal size prostate with nonspecific internal prostatic calcifications. Other: No pneumoperitoneum, ascites or focal fluid collection. Musculoskeletal: No aggressive appearing focal osseous lesions. Moderate degenerative changes in the lumbar spine. IMPRESSION: 1. Significant interval treatment response. Mild residual mediastinal and retroperitoneal lymphadenopathy, significantly decreased.  2. Mild splenomegaly, decreased. Hypodense inferior splenic mass is mildly decreased. 3. Left upper lobe  wedge resection, with no evidence of local tumor recurrence. 4. Additional findings include coronary atherosclerosis, calcified right pleural plaques, dilated pulmonary artery suggesting chronic pulmonary arterial hypertension and nonobstructing bilateral renal stones. Electronically Signed   By: Ilona Sorrel M.D.   On: 12/02/2015 09:50    ASSESSMENT AND PLAN: This is a very pleasant 65 years old white male diagnosed with a stage III large B-cell non-Hodgkin lymphoma and currently undergoing systemic chemotherapy with CHOP/Rituxan status post 4 cycles. He is tolerating his treatment fairly well with no significant adverse effect. I recommended for him to continue his current systemic chemotherapy with CHOP/Rituxan and the patient will receive cycle #5 today. He will come back for follow-up visit in 3 weeks for evaluation before starting cycle #6.  For COPD, he is followed by Dr. Chase Caller.  The patient was advised to call immediately if he has any concerning symptoms in the interval. The patient voices understanding of current disease status and treatment options and is in agreement with the current care plan.  All questions were answered. The patient knows to call the clinic with any problems, questions or concerns. We can certainly see the patient much sooner if necessary.  Disclaimer: This note was dictated with voice recognition software. Similar sounding words can inadvertently be transcribed and may not be corrected upon review.

## 2015-12-25 DIAGNOSIS — J449 Chronic obstructive pulmonary disease, unspecified: Secondary | ICD-10-CM | POA: Diagnosis not present

## 2015-12-26 ENCOUNTER — Ambulatory Visit (HOSPITAL_BASED_OUTPATIENT_CLINIC_OR_DEPARTMENT_OTHER): Payer: PPO

## 2015-12-26 VITALS — BP 136/64 | HR 93 | Temp 98.2°F

## 2015-12-26 DIAGNOSIS — C8258 Diffuse follicle center lymphoma, lymph nodes of multiple sites: Secondary | ICD-10-CM

## 2015-12-26 DIAGNOSIS — Z5189 Encounter for other specified aftercare: Secondary | ICD-10-CM | POA: Diagnosis not present

## 2015-12-26 DIAGNOSIS — C8338 Diffuse large B-cell lymphoma, lymph nodes of multiple sites: Secondary | ICD-10-CM | POA: Diagnosis not present

## 2015-12-26 MED ORDER — PEGFILGRASTIM INJECTION 6 MG/0.6ML ~~LOC~~
6.0000 mg | PREFILLED_SYRINGE | Freq: Once | SUBCUTANEOUS | Status: AC
  Administered 2015-12-26: 6 mg via SUBCUTANEOUS
  Filled 2015-12-26: qty 0.6

## 2015-12-31 ENCOUNTER — Other Ambulatory Visit (HOSPITAL_BASED_OUTPATIENT_CLINIC_OR_DEPARTMENT_OTHER): Payer: PPO

## 2015-12-31 DIAGNOSIS — C8338 Diffuse large B-cell lymphoma, lymph nodes of multiple sites: Secondary | ICD-10-CM | POA: Diagnosis not present

## 2015-12-31 LAB — COMPREHENSIVE METABOLIC PANEL
ALBUMIN: 3.2 g/dL — AB (ref 3.5–5.0)
ANION GAP: 7 meq/L (ref 3–11)
AST: <7 U/L (ref 5–34)
Alkaline Phosphatase: 94 U/L (ref 40–150)
BUN: 24.7 mg/dL (ref 7.0–26.0)
CO2: 33 meq/L — AB (ref 22–29)
Calcium: 8.6 mg/dL (ref 8.4–10.4)
Chloride: 104 mEq/L (ref 98–109)
Creatinine: 1 mg/dL (ref 0.7–1.3)
EGFR: 80 mL/min/{1.73_m2} — ABNORMAL LOW (ref >90)
GLUCOSE: 113 mg/dL (ref 70–140)
Potassium: 4.8 mEq/L (ref 3.5–5.1)
SODIUM: 144 meq/L (ref 136–145)
TOTAL PROTEIN: 5.7 g/dL — AB (ref 6.4–8.3)
Total Bilirubin: 0.3 mg/dL (ref 0.20–1.20)

## 2015-12-31 LAB — CBC WITH DIFFERENTIAL/PLATELET
BASO%: 1 % (ref 0.0–2.0)
Basophils Absolute: 0.1 10*3/uL (ref 0.0–0.1)
EOS%: 1.5 % (ref 0.0–7.0)
Eosinophils Absolute: 0.1 10*3/uL (ref 0.0–0.5)
HCT: 26.2 % — ABNORMAL LOW (ref 38.4–49.9)
HGB: 8.4 g/dL — ABNORMAL LOW (ref 13.0–17.1)
LYMPH%: 7.2 % — ABNORMAL LOW (ref 14.0–49.0)
MCH: 29.7 pg (ref 27.2–33.4)
MCHC: 31.9 g/dL — ABNORMAL LOW (ref 32.0–36.0)
MCV: 93.1 fL (ref 79.3–98.0)
MONO#: 0.3 10*3/uL (ref 0.1–0.9)
MONO%: 4.5 % (ref 0.0–14.0)
NEUT#: 5.4 10*3/uL (ref 1.5–6.5)
NEUT%: 85.8 % — ABNORMAL HIGH (ref 39.0–75.0)
Platelets: 112 10*3/uL — ABNORMAL LOW (ref 140–400)
RBC: 2.81 10*6/uL — ABNORMAL LOW (ref 4.20–5.82)
RDW: 16.7 % — ABNORMAL HIGH (ref 11.0–14.6)
WBC: 6.2 10*3/uL (ref 4.0–10.3)
lymph#: 0.5 10*3/uL — ABNORMAL LOW (ref 0.9–3.3)

## 2016-01-07 ENCOUNTER — Other Ambulatory Visit (HOSPITAL_BASED_OUTPATIENT_CLINIC_OR_DEPARTMENT_OTHER): Payer: PPO

## 2016-01-07 DIAGNOSIS — C8338 Diffuse large B-cell lymphoma, lymph nodes of multiple sites: Secondary | ICD-10-CM

## 2016-01-07 LAB — COMPREHENSIVE METABOLIC PANEL
AST: 10 U/L (ref 5–34)
Albumin: 3.3 g/dL — ABNORMAL LOW (ref 3.5–5.0)
Alkaline Phosphatase: 89 U/L (ref 40–150)
Anion Gap: 6 mEq/L (ref 3–11)
BUN: 14.5 mg/dL (ref 7.0–26.0)
CALCIUM: 9.3 mg/dL (ref 8.4–10.4)
CHLORIDE: 101 meq/L (ref 98–109)
CO2: 35 mEq/L — ABNORMAL HIGH (ref 22–29)
CREATININE: 1.1 mg/dL (ref 0.7–1.3)
EGFR: 74 mL/min/{1.73_m2} — ABNORMAL LOW (ref >90)
Glucose: 112 mg/dl (ref 70–140)
Potassium: 5.3 mEq/L — ABNORMAL HIGH (ref 3.5–5.1)
Sodium: 141 mEq/L (ref 136–145)
Total Protein: 6.1 g/dL — ABNORMAL LOW (ref 6.4–8.3)

## 2016-01-07 LAB — CBC WITH DIFFERENTIAL/PLATELET
BASO%: 0.6 % (ref 0.0–2.0)
BASOS ABS: 0.1 10*3/uL (ref 0.0–0.1)
EOS%: 0.7 % (ref 0.0–7.0)
Eosinophils Absolute: 0.1 10*3/uL (ref 0.0–0.5)
HEMATOCRIT: 29 % — AB (ref 38.4–49.9)
HGB: 9.2 g/dL — ABNORMAL LOW (ref 13.0–17.1)
LYMPH#: 0.6 10*3/uL — AB (ref 0.9–3.3)
LYMPH%: 3.9 % — AB (ref 14.0–49.0)
MCH: 29.5 pg (ref 27.2–33.4)
MCHC: 31.8 g/dL — AB (ref 32.0–36.0)
MCV: 93 fL (ref 79.3–98.0)
MONO#: 0.9 10*3/uL (ref 0.1–0.9)
MONO%: 5.7 % (ref 0.0–14.0)
NEUT#: 14.6 10*3/uL — ABNORMAL HIGH (ref 1.5–6.5)
NEUT%: 89.1 % — AB (ref 39.0–75.0)
PLATELETS: 181 10*3/uL (ref 140–400)
RBC: 3.11 10*6/uL — AB (ref 4.20–5.82)
RDW: 17 % — ABNORMAL HIGH (ref 11.0–14.6)
WBC: 16.5 10*3/uL — ABNORMAL HIGH (ref 4.0–10.3)

## 2016-01-08 DIAGNOSIS — G894 Chronic pain syndrome: Secondary | ICD-10-CM | POA: Diagnosis not present

## 2016-01-14 ENCOUNTER — Other Ambulatory Visit (HOSPITAL_BASED_OUTPATIENT_CLINIC_OR_DEPARTMENT_OTHER): Payer: PPO

## 2016-01-14 ENCOUNTER — Emergency Department (HOSPITAL_COMMUNITY): Payer: PPO

## 2016-01-14 ENCOUNTER — Other Ambulatory Visit (HOSPITAL_COMMUNITY): Payer: Self-pay

## 2016-01-14 ENCOUNTER — Ambulatory Visit: Payer: PPO

## 2016-01-14 ENCOUNTER — Other Ambulatory Visit: Payer: Self-pay

## 2016-01-14 ENCOUNTER — Encounter: Payer: Self-pay | Admitting: Internal Medicine

## 2016-01-14 ENCOUNTER — Inpatient Hospital Stay (HOSPITAL_COMMUNITY)
Admission: EM | Admit: 2016-01-14 | Discharge: 2016-01-17 | DRG: 871 | Disposition: A | Discharge status: Home / Self Care | Payer: PPO | Attending: Internal Medicine | Admitting: Internal Medicine

## 2016-01-14 ENCOUNTER — Encounter (HOSPITAL_COMMUNITY): Payer: Self-pay

## 2016-01-14 ENCOUNTER — Ambulatory Visit (HOSPITAL_BASED_OUTPATIENT_CLINIC_OR_DEPARTMENT_OTHER): Payer: PPO | Admitting: Internal Medicine

## 2016-01-14 VITALS — BP 64/33 | HR 125 | Temp 98.0°F | Resp 20

## 2016-01-14 DIAGNOSIS — Z7952 Long term (current) use of systemic steroids: Secondary | ICD-10-CM | POA: Diagnosis not present

## 2016-01-14 DIAGNOSIS — R6520 Severe sepsis without septic shock: Secondary | ICD-10-CM

## 2016-01-14 DIAGNOSIS — E039 Hypothyroidism, unspecified: Secondary | ICD-10-CM | POA: Diagnosis not present

## 2016-01-14 DIAGNOSIS — J449 Chronic obstructive pulmonary disease, unspecified: Secondary | ICD-10-CM | POA: Diagnosis present

## 2016-01-14 DIAGNOSIS — J9611 Chronic respiratory failure with hypoxia: Secondary | ICD-10-CM | POA: Diagnosis present

## 2016-01-14 DIAGNOSIS — R509 Fever, unspecified: Secondary | ICD-10-CM | POA: Diagnosis not present

## 2016-01-14 DIAGNOSIS — R1310 Dysphagia, unspecified: Secondary | ICD-10-CM | POA: Diagnosis present

## 2016-01-14 DIAGNOSIS — I959 Hypotension, unspecified: Secondary | ICD-10-CM

## 2016-01-14 DIAGNOSIS — Z6830 Body mass index (BMI) 30.0-30.9, adult: Secondary | ICD-10-CM

## 2016-01-14 DIAGNOSIS — N400 Enlarged prostate without lower urinary tract symptoms: Secondary | ICD-10-CM | POA: Diagnosis not present

## 2016-01-14 DIAGNOSIS — A419 Sepsis, unspecified organism: Principal | ICD-10-CM | POA: Diagnosis present

## 2016-01-14 DIAGNOSIS — R05 Cough: Secondary | ICD-10-CM

## 2016-01-14 DIAGNOSIS — R0602 Shortness of breath: Secondary | ICD-10-CM | POA: Diagnosis not present

## 2016-01-14 DIAGNOSIS — Z87891 Personal history of nicotine dependence: Secondary | ICD-10-CM | POA: Diagnosis not present

## 2016-01-14 DIAGNOSIS — N179 Acute kidney failure, unspecified: Secondary | ICD-10-CM | POA: Diagnosis not present

## 2016-01-14 DIAGNOSIS — E669 Obesity, unspecified: Secondary | ICD-10-CM | POA: Diagnosis present

## 2016-01-14 DIAGNOSIS — J9621 Acute and chronic respiratory failure with hypoxia: Secondary | ICD-10-CM | POA: Diagnosis present

## 2016-01-14 DIAGNOSIS — Z8701 Personal history of pneumonia (recurrent): Secondary | ICD-10-CM

## 2016-01-14 DIAGNOSIS — Z8249 Family history of ischemic heart disease and other diseases of the circulatory system: Secondary | ICD-10-CM | POA: Diagnosis not present

## 2016-01-14 DIAGNOSIS — C8338 Diffuse large B-cell lymphoma, lymph nodes of multiple sites: Secondary | ICD-10-CM

## 2016-01-14 DIAGNOSIS — R5383 Other fatigue: Secondary | ICD-10-CM

## 2016-01-14 DIAGNOSIS — J690 Pneumonitis due to inhalation of food and vomit: Secondary | ICD-10-CM | POA: Diagnosis not present

## 2016-01-14 DIAGNOSIS — Z9981 Dependence on supplemental oxygen: Secondary | ICD-10-CM | POA: Diagnosis not present

## 2016-01-14 DIAGNOSIS — K219 Gastro-esophageal reflux disease without esophagitis: Secondary | ICD-10-CM | POA: Diagnosis present

## 2016-01-14 DIAGNOSIS — T451X5A Adverse effect of antineoplastic and immunosuppressive drugs, initial encounter: Secondary | ICD-10-CM | POA: Diagnosis present

## 2016-01-14 DIAGNOSIS — I10 Essential (primary) hypertension: Secondary | ICD-10-CM | POA: Diagnosis present

## 2016-01-14 DIAGNOSIS — C8332 Diffuse large B-cell lymphoma, intrathoracic lymph nodes: Secondary | ICD-10-CM

## 2016-01-14 DIAGNOSIS — I110 Hypertensive heart disease with heart failure: Secondary | ICD-10-CM | POA: Diagnosis present

## 2016-01-14 DIAGNOSIS — F329 Major depressive disorder, single episode, unspecified: Secondary | ICD-10-CM | POA: Diagnosis not present

## 2016-01-14 DIAGNOSIS — R1319 Other dysphagia: Secondary | ICD-10-CM | POA: Diagnosis present

## 2016-01-14 DIAGNOSIS — J189 Pneumonia, unspecified organism: Secondary | ICD-10-CM | POA: Diagnosis present

## 2016-01-14 DIAGNOSIS — R0902 Hypoxemia: Secondary | ICD-10-CM | POA: Diagnosis not present

## 2016-01-14 DIAGNOSIS — I509 Heart failure, unspecified: Secondary | ICD-10-CM | POA: Diagnosis present

## 2016-01-14 DIAGNOSIS — J440 Chronic obstructive pulmonary disease with acute lower respiratory infection: Secondary | ICD-10-CM | POA: Diagnosis not present

## 2016-01-14 DIAGNOSIS — C8590 Non-Hodgkin lymphoma, unspecified, unspecified site: Secondary | ICD-10-CM | POA: Diagnosis present

## 2016-01-14 DIAGNOSIS — D6481 Anemia due to antineoplastic chemotherapy: Secondary | ICD-10-CM | POA: Diagnosis present

## 2016-01-14 HISTORY — DX: Sepsis, unspecified organism: A41.9

## 2016-01-14 HISTORY — DX: Acute kidney failure, unspecified: N17.9

## 2016-01-14 HISTORY — DX: Severe sepsis without septic shock: A41.9

## 2016-01-14 LAB — CBC WITH DIFFERENTIAL/PLATELET
BAND NEUTROPHILS: 26 %
BASO%: 0.4 % (ref 0.0–2.0)
BASOS ABS: 0.1 10*3/uL (ref 0.0–0.1)
BASOS ABS: 0 10*3/uL (ref 0.0–0.1)
BLASTS: 0 %
Basophils Relative: 0 %
EOS ABS: 0.1 10*3/uL (ref 0.0–0.5)
EOS ABS: 0 10*3/uL (ref 0.0–0.7)
EOS PCT: 0 %
EOS%: 0.2 % (ref 0.0–7.0)
HCT: 32.1 % — ABNORMAL LOW (ref 38.4–49.9)
HCT: 30.3 % — ABNORMAL LOW (ref 39.0–52.0)
HGB: 10 g/dL — ABNORMAL LOW (ref 13.0–17.1)
Hemoglobin: 9.3 g/dL — ABNORMAL LOW (ref 13.0–17.0)
LYMPH%: 3.6 % — ABNORMAL LOW (ref 14.0–49.0)
LYMPHS ABS: 0 10*3/uL — AB (ref 0.7–4.0)
Lymphocytes Relative: 0 %
MCH: 29 pg (ref 27.2–33.4)
MCH: 29 pg (ref 26.0–34.0)
MCHC: 31 g/dL — AB (ref 32.0–36.0)
MCHC: 30.7 g/dL (ref 30.0–36.0)
MCV: 93.4 fL (ref 79.3–98.0)
MCV: 94.4 fL (ref 78.0–100.0)
METAMYELOCYTES PCT: 1 %
MONO#: 1.7 10*3/uL — ABNORMAL HIGH (ref 0.1–0.9)
MONO%: 5.3 % (ref 0.0–14.0)
MONOS PCT: 4 %
MYELOCYTES: 0 %
Monocytes Absolute: 1.4 10*3/uL — ABNORMAL HIGH (ref 0.1–1.0)
NEUT#: 28.4 10*3/uL — ABNORMAL HIGH (ref 1.5–6.5)
NEUT%: 90.5 % — AB (ref 39.0–75.0)
NEUTROS ABS: 33.5 10*3/uL — AB (ref 1.7–7.7)
Neutrophils Relative %: 69 %
Other: 0 %
PLATELETS: 215 10*3/uL (ref 150–400)
Platelets: 265 10*3/uL (ref 140–400)
Promyelocytes Absolute: 0 %
RBC: 3.44 10*6/uL — ABNORMAL LOW (ref 4.20–5.82)
RBC: 3.21 MIL/uL — ABNORMAL LOW (ref 4.22–5.81)
RDW: 17.1 % — AB (ref 11.0–14.6)
RDW: 16.9 % — AB (ref 11.5–15.5)
WBC MORPHOLOGY: INCREASED
WBC: 31.4 10*3/uL — ABNORMAL HIGH (ref 4.0–10.3)
WBC: 34.9 10*3/uL — AB (ref 4.0–10.5)
lymph#: 1.1 10*3/uL (ref 0.9–3.3)
nRBC: 0 /100 WBC

## 2016-01-14 LAB — URINALYSIS, ROUTINE W REFLEX MICROSCOPIC
GLUCOSE, UA: NEGATIVE mg/dL
Hgb urine dipstick: NEGATIVE
Ketones, ur: NEGATIVE mg/dL
LEUKOCYTES UA: NEGATIVE
Nitrite: NEGATIVE
PH: 5.5 (ref 5.0–8.0)
PROTEIN: NEGATIVE mg/dL
Specific Gravity, Urine: 1.024 (ref 1.005–1.030)

## 2016-01-14 LAB — COMPREHENSIVE METABOLIC PANEL
ALK PHOS: 109 U/L (ref 38–126)
ALT: 53 U/L (ref 0–55)
ALT: 49 U/L (ref 17–63)
AST: 15 U/L (ref 5–34)
AST: 18 U/L (ref 15–41)
Albumin: 3.3 g/dL — ABNORMAL LOW (ref 3.5–5.0)
Albumin: 3.5 g/dL (ref 3.5–5.0)
Alkaline Phosphatase: 124 U/L (ref 40–150)
Anion Gap: 8 mEq/L (ref 3–11)
Anion gap: 6 (ref 5–15)
BUN: 20.1 mg/dL (ref 7.0–26.0)
BUN: 22 mg/dL — AB (ref 6–20)
CALCIUM: 8.7 mg/dL — AB (ref 8.9–10.3)
CHLORIDE: 100 meq/L (ref 98–109)
CHLORIDE: 101 mmol/L (ref 101–111)
CO2: 33 meq/L — AB (ref 22–29)
CO2: 34 mmol/L — ABNORMAL HIGH (ref 22–32)
CREATININE: 1.43 mg/dL — AB (ref 0.61–1.24)
Calcium: 9 mg/dL (ref 8.4–10.4)
Creatinine: 1.5 mg/dL — ABNORMAL HIGH (ref 0.7–1.3)
EGFR: 50 mL/min/{1.73_m2} — ABNORMAL LOW (ref >90)
GFR, EST AFRICAN AMERICAN: 58 mL/min — AB (ref >60)
GFR, EST NON AFRICAN AMERICAN: 50 mL/min — AB (ref >60)
GLUCOSE: 117 mg/dL (ref 70–140)
Glucose, Bld: 127 mg/dL — ABNORMAL HIGH (ref 65–99)
POTASSIUM: 4.9 meq/L (ref 3.5–5.1)
Potassium: 5.1 mmol/L (ref 3.5–5.1)
SODIUM: 141 meq/L (ref 136–145)
Sodium: 141 mmol/L (ref 135–145)
Total Bilirubin: 0.34 mg/dL (ref 0.20–1.20)
Total Bilirubin: 0.3 mg/dL (ref 0.3–1.2)
Total Protein: 5.9 g/dL — ABNORMAL LOW (ref 6.4–8.3)
Total Protein: 6.1 g/dL — ABNORMAL LOW (ref 6.5–8.1)

## 2016-01-14 LAB — EXPECTORATED SPUTUM ASSESSMENT W GRAM STAIN, RFLX TO RESP C

## 2016-01-14 LAB — EXPECTORATED SPUTUM ASSESSMENT W REFEX TO RESP CULTURE

## 2016-01-14 LAB — PROCALCITONIN: PROCALCITONIN: 7.53 ng/mL

## 2016-01-14 LAB — APTT: APTT: 31 s (ref 24–37)

## 2016-01-14 LAB — PROTIME-INR
INR: 1.08 (ref 0.00–1.49)
Prothrombin Time: 14.2 seconds (ref 11.6–15.2)

## 2016-01-14 LAB — I-STAT CG4 LACTIC ACID, ED
LACTIC ACID, VENOUS: 1.33 mmol/L (ref 0.5–2.0)
Lactic Acid, Venous: 0.44 mmol/L — ABNORMAL LOW (ref 0.5–2.0)

## 2016-01-14 LAB — MRSA PCR SCREENING: MRSA by PCR: NEGATIVE

## 2016-01-14 LAB — STREP PNEUMONIAE URINARY ANTIGEN: Strep Pneumo Urinary Antigen: NEGATIVE

## 2016-01-14 MED ORDER — PROCHLORPERAZINE MALEATE 10 MG PO TABS: 10.0000 mg | ORAL_TABLET | Freq: Four times a day (QID) | ORAL | Status: DC | PRN

## 2016-01-14 MED ORDER — OXYCODONE HCL 5 MG PO TABS: 5.0000 mg | ORAL_TABLET | ORAL | Status: DC | PRN

## 2016-01-14 MED ORDER — SODIUM CHLORIDE 0.9 % IV BOLUS (SEPSIS)
1000.0000 mL | INTRAVENOUS | Status: AC
  Administered 2016-01-14 (×3): 1000 mL via INTRAVENOUS

## 2016-01-14 MED ORDER — ENOXAPARIN SODIUM 40 MG/0.4ML ~~LOC~~ SOLN
40.0000 mg | SUBCUTANEOUS | Status: DC
  Administered 2016-01-14: 40 mg via SUBCUTANEOUS
  Filled 2016-01-14: qty 0.4

## 2016-01-14 MED ORDER — LEVOTHYROXINE SODIUM 50 MCG PO TABS
150.0000 ug | ORAL_TABLET | Freq: Every day | ORAL | Status: DC
  Administered 2016-01-15 – 2016-01-17 (×3): 150 ug via ORAL
  Filled 2016-01-14: qty 2
  Filled 2016-01-14: qty 1
  Filled 2016-01-14: qty 2

## 2016-01-14 MED ORDER — LISINOPRIL 10 MG PO TABS
10.0000 mg | ORAL_TABLET | Freq: Every day | ORAL | Status: DC
  Administered 2016-01-14: 10 mg via ORAL
  Filled 2016-01-14: qty 1

## 2016-01-14 MED ORDER — ALLOPURINOL 100 MG PO TABS
100.0000 mg | ORAL_TABLET | Freq: Two times a day (BID) | ORAL | Status: DC
  Administered 2016-01-14 – 2016-01-17 (×6): 100 mg via ORAL
  Filled 2016-01-14 (×6): qty 1

## 2016-01-14 MED ORDER — VANCOMYCIN HCL 10 G IV SOLR
2000.0000 mg | Freq: Once | INTRAVENOUS | Status: AC
  Administered 2016-01-14: 2000 mg via INTRAVENOUS
  Filled 2016-01-14: qty 2000

## 2016-01-14 MED ORDER — SODIUM CHLORIDE 0.9 % IV SOLN: INTRAVENOUS | Status: AC

## 2016-01-14 MED ORDER — LIDOCAINE-PRILOCAINE 2.5-2.5 % EX CREA
1.0000 "application " | TOPICAL_CREAM | CUTANEOUS | Status: DC | PRN
  Filled 2016-01-14: qty 5

## 2016-01-14 MED ORDER — SODIUM CHLORIDE 0.9 % IV SOLN
INTRAVENOUS | Status: DC
  Administered 2016-01-14 – 2016-01-15 (×2): via INTRAVENOUS

## 2016-01-14 MED ORDER — OXYCODONE-ACETAMINOPHEN 5-325 MG PO TABS
1.0000 | ORAL_TABLET | ORAL | Status: DC | PRN
  Administered 2016-01-15: 1 via ORAL
  Filled 2016-01-14: qty 1

## 2016-01-14 MED ORDER — ALBUTEROL SULFATE (2.5 MG/3ML) 0.083% IN NEBU: 2.5000 mg | INHALATION_SOLUTION | Freq: Four times a day (QID) | RESPIRATORY_TRACT | Status: DC | PRN

## 2016-01-14 MED ORDER — LORATADINE 10 MG PO TABS
10.0000 mg | ORAL_TABLET | Freq: Every day | ORAL | Status: DC
  Administered 2016-01-14 – 2016-01-17 (×4): 10 mg via ORAL
  Filled 2016-01-14 (×4): qty 1

## 2016-01-14 MED ORDER — TIOTROPIUM BROMIDE MONOHYDRATE 18 MCG IN CAPS
18.0000 ug | ORAL_CAPSULE | Freq: Every day | RESPIRATORY_TRACT | Status: DC
  Administered 2016-01-15 – 2016-01-17 (×3): 18 ug via RESPIRATORY_TRACT
  Filled 2016-01-14: qty 5

## 2016-01-14 MED ORDER — ESCITALOPRAM OXALATE 10 MG PO TABS
5.0000 mg | ORAL_TABLET | Freq: Every day | ORAL | Status: DC
  Administered 2016-01-14 – 2016-01-17 (×4): 5 mg via ORAL
  Filled 2016-01-14 (×4): qty 1

## 2016-01-14 MED ORDER — DEXTROSE 5 % IV SOLN
2.0000 g | Freq: Once | INTRAVENOUS | Status: AC
  Administered 2016-01-14: 2 g via INTRAVENOUS
  Filled 2016-01-14: qty 2

## 2016-01-14 MED ORDER — VANCOMYCIN HCL 10 G IV SOLR
1250.0000 mg | INTRAVENOUS | Status: DC
  Administered 2016-01-15: 1250 mg via INTRAVENOUS
  Filled 2016-01-14 (×2): qty 1250

## 2016-01-14 MED ORDER — SODIUM CHLORIDE 0.9 % IV BOLUS (SEPSIS): 1000.0000 mL | Freq: Once | INTRAVENOUS | Status: AC

## 2016-01-14 MED ORDER — ALBUTEROL SULFATE (2.5 MG/3ML) 0.083% IN NEBU
3.0000 mL | INHALATION_SOLUTION | Freq: Four times a day (QID) | RESPIRATORY_TRACT | Status: DC | PRN
  Administered 2016-01-15: 3 mL via RESPIRATORY_TRACT
  Filled 2016-01-14: qty 3

## 2016-01-14 MED ORDER — VANCOMYCIN HCL IN DEXTROSE 1-5 GM/200ML-% IV SOLN: 1000.0000 mg | Freq: Once | INTRAVENOUS | Status: DC

## 2016-01-14 MED ORDER — PANTOPRAZOLE SODIUM 40 MG PO TBEC
40.0000 mg | DELAYED_RELEASE_TABLET | Freq: Every day | ORAL | Status: DC
  Administered 2016-01-14 – 2016-01-16 (×3): 40 mg via ORAL
  Filled 2016-01-14 (×3): qty 1

## 2016-01-14 MED ORDER — ASPIRIN 325 MG PO TABS
325.0000 mg | ORAL_TABLET | Freq: Every day | ORAL | Status: DC
  Administered 2016-01-14 – 2016-01-17 (×4): 325 mg via ORAL
  Filled 2016-01-14 (×4): qty 1

## 2016-01-14 MED ORDER — FINASTERIDE 5 MG PO TABS
5.0000 mg | ORAL_TABLET | Freq: Every day | ORAL | Status: DC
  Administered 2016-01-14 – 2016-01-17 (×4): 5 mg via ORAL
  Filled 2016-01-14 (×4): qty 1

## 2016-01-14 MED ORDER — DEXTROSE 5 % IV SOLN
2.0000 g | Freq: Three times a day (TID) | INTRAVENOUS | Status: DC
  Filled 2016-01-14: qty 2

## 2016-01-14 MED ORDER — OXYCODONE-ACETAMINOPHEN 10-325 MG PO TABS: 1.0000 | ORAL_TABLET | ORAL | Status: DC | PRN

## 2016-01-14 MED ORDER — HYDROCORTISONE NA SUCCINATE PF 100 MG IJ SOLR
50.0000 mg | Freq: Two times a day (BID) | INTRAMUSCULAR | Status: DC
  Administered 2016-01-14 – 2016-01-15 (×2): 50 mg via INTRAVENOUS
  Filled 2016-01-14 (×2): qty 2

## 2016-01-14 MED ORDER — PIPERACILLIN-TAZOBACTAM 3.375 G IVPB
3.3750 g | Freq: Three times a day (TID) | INTRAVENOUS | Status: DC
  Administered 2016-01-15 – 2016-01-17 (×8): 3.375 g via INTRAVENOUS
  Filled 2016-01-14 (×8): qty 50

## 2016-01-14 MED ORDER — CETYLPYRIDINIUM CHLORIDE 0.05 % MT LIQD
7.0000 mL | Freq: Two times a day (BID) | OROMUCOSAL | Status: DC
  Administered 2016-01-14 – 2016-01-16 (×2): 7 mL via OROMUCOSAL

## 2016-01-14 MED ORDER — CHLORHEXIDINE GLUCONATE 0.12 % MT SOLN
15.0000 mL | Freq: Two times a day (BID) | OROMUCOSAL | Status: DC
  Administered 2016-01-16 – 2016-01-17 (×3): 15 mL via OROMUCOSAL
  Filled 2016-01-14 (×3): qty 15

## 2016-01-14 MED ORDER — PIPERACILLIN-TAZOBACTAM 3.375 G IVPB
3.3750 g | Freq: Once | INTRAVENOUS | Status: AC
  Administered 2016-01-14: 3.375 g via INTRAVENOUS
  Filled 2016-01-14: qty 50

## 2016-01-14 NOTE — Progress Notes (Signed)
Pharmacy Antibiotic Note  [name] is a 65 y.o. male with stage III large B-cell non-Hodgkin lymphoma currently undergoing chemotherapy treatment who was supposed to get cycle 6 on 4/25, but presented to the Cancer center with c/o weakness, fatigue, fever, cough, and fever. He was then transferred to [facility] where CXR showed new R middle lobe opacity concerning for PNA or atelectasis. To start vancomycin and cefepime for sepsis/PNA. Pharmacy was initially consulted to dose vancomycin and cefepime for sepsis/PNA, but has now been consulted for Vancomycin and Zosyn dosing for suspected aspiration pneumonia. Tmax 102.3, WBC elevated at 34.9, and CrCl ~ 52 ml/min N  Plan: Zosyn 3.375g IV Q8H infused over 4hrs.  Vancomycin 2g IV loading dose, then 1250 mg IV q24h. Measure Vanc trough at steady state. Follow up renal fxn, culture results, and clinical course.  Height: 6' (182.9 cm) Weight: 221 lb 9 oz (100.5 kg) IBW/kg (Calculated) : 77.6  Temp (24hrs), Avg:100.4 F (38 C), Min:98 F (36.7 C), Max:102.3 F (39.1 C)   Recent Labs Lab 01/14/16 0844 01/14/16 1029 01/14/16 1040 01/14/16 1255  WBC 31.4* 34.9*  --   --   CREATININE 1.5* 1.43*  --   --   LATICACIDVEN  --   --  1.33 0.44*    Estimated Creatinine Clearance: 63.2 mL/min (by C-G formula based on Cr of 1.43).    No Known Allergies  Antimicrobials this admission: 4/25 vanc >> 4/25 cefepime >> 4/25 4/25 Zosyn >>   Dose adjustments this admission:  Microbiology results: 4/25 BCx: sent 4/25 UCx: sent  Sputum: ordered  4/25 MRSA PCR: sent  Thank you for allowing pharmacy to be a part of this patient's care.  Gretta Arab PharmD, BCPS Pager [phone] 01/14/2016 5:10 PM

## 2016-01-14 NOTE — ED Notes (Addendum)
Patient transported from cancer center for hypotension.  Patient scheduled for chemo this AM and c/o feeling lightheaded with blurred vision.  Patient was found to have systolic BP in 61'T.  Patient started on Augmentin this morning for pneumonia prophylaxis.  Patient's temp at home this AM was 101.5.  Other vitals at cancer center = 83% O2 sat on 3L, HR 90's.  Last chemo 3wks ago.

## 2016-01-14 NOTE — ED Provider Notes (Signed)
CSN: [id number]     Arrival date & time 01/14/16  0920 History   First MD Initiated Contact with Patient 01/14/16 [phone]     Chief Complaint  Patient presents with  . Hypotension     (Consider location/radiation/quality/duration/timing/severity/associated sxs/prior Treatment) HPI   [name] is a 65 y.o. male  who presents from the oncology infusion center, for evaluation of fever, cough, hypoxia and hypotension. He had presented there today for initiation of cycle #6, chemotherapy, for cancer. He has been feeling weakness and fatigue for several days, and has begun coughing productive of yellow sputum. He complains of shortness of breath which is worse with exertion. He has not been vomiting. He denies dysuria, urinary frequency, diarrhea or constipation. There are no other known modifying factors.     Past Medical History  Diagnosis Date  . Streptococcal pneumonia [facility]) August 2009  . Congestive heart failure ([place])   . Gastrointestinal obstruction (HCC)     Ileus  . Pulmonary hypertension ([place])   . Polycythemia   . Hypothyroid   . COPD (chronic obstructive pulmonary disease) ([place])   . Obesity   . Gout   . BPH (benign prostatic hyperplasia) 2011  . Depression   . Hypertension   . Lung cancer ([place])   . NHL (non-Hodgkin's lymphoma) ([place]) 09/25/2015  . Encounter for antineoplastic chemotherapy 10/08/2015  . Drug-induced neutropenia ([place]) 10/08/2015  . Sepsis ([place]) 01/14/2016   Past Surgical History  Procedure Laterality Date  . Appendectomy    . Back surgery    . Lung cancer surgery  2011  . Cardioversion  11/27/2011    Procedure: CARDIOVERSION;  Surgeon: Thayer Headings, MD;  Location: [facility] ENDOSCOPY;  Service: Cardiovascular;  Laterality: N/A;   Family History  Problem Relation Age of Onset  . Heart disease Father   . Cancer Neg Hx   . Diabetes Neg Hx   . Stroke Neg Hx   . Hypertension Neg Hx   . Kidney disease Neg Hx    Social History  Substance Use Topics  .  Smoking status: Former Smoker -- 3.00 packs/day for 43 years    Types: Cigarettes    Quit date: 10/22/2001  . Smokeless tobacco: Never Used  . Alcohol Use: No    Review of Systems  All other systems reviewed and are negative.     Allergies  Review of patient's allergies indicates no known allergies.  Home Medications   Prior to Admission medications   Medication Sig Start Date End Date Taking? Authorizing Provider  albuterol (PROVENTIL) (2.5 MG/3ML) 0.083% nebulizer solution INHALE 3 MILLILITERS (2.5 MG) BY NEBULIZATION ROUTE EVERY 8 HOURS AS NEEDED for shortness of breath 08/30/15  Yes Historical Provider, MD  allopurinol (ZYLOPRIM) 100 MG tablet Take 1 tablet (100 mg total) by mouth 2 (two) times daily. Patient taking differently: Take 100 mg by mouth 2 (two) times daily as needed (pain).  09/25/15  Yes Curt Bears, MD  amLODipine (NORVASC) 5 MG tablet Take 1 tablet by mouth Daily. 03/09/11  Yes Historical Provider, MD  amoxicillin-clavulanate (AUGMENTIN) 875-125 MG tablet Take 1 tablet by mouth 2 (two) times daily.   Yes Historical Provider, MD  aspirin 325 MG tablet Take 325 mg by mouth daily.     Yes Historical Provider, MD  escitalopram (LEXAPRO) 5 MG tablet Take 5 mg by mouth daily. 12/09/15  Yes Historical Provider, MD  finasteride (PROSCAR) 5 MG tablet Take 5 mg by mouth daily.   Yes Historical Provider,  MD  levothyroxine (SYNTHROID, LEVOTHROID) 150 MCG tablet Take 150 mcg by mouth daily. 11/07/15  Yes Historical Provider, MD  lidocaine-prilocaine (EMLA) cream Apply 1 application topically as needed. 09/25/15  Yes Curt Bears, MD  lisinopril (PRINIVIL,ZESTRIL) 10 MG tablet Take 1 tablet (10 mg total) by mouth daily. 12/29/11  Yes Thayer Headings, MD  loratadine (CLARITIN) 10 MG tablet Take 10 mg by mouth daily. Reported on 11/12/2015   Yes Historical Provider, MD  omeprazole (PRILOSEC) 20 MG capsule Take 1 tablet by mouth Twice daily. 07/01/12  Yes Historical Provider, MD   oxyCODONE-acetaminophen (PERCOCET) 10-325 MG tablet Take 1 tablet by mouth every 4 (four) hours as needed for pain.   Yes Historical Provider, MD  predniSONE (DELTASONE) 20 MG tablet Take 1 tablet (20 mg total) by mouth daily with breakfast. 09/19/15  Yes Chesley Mires, MD  predniSONE (DELTASONE) 50 MG tablet 2 tablets by mouth daily for 5 days starting with the first dose of chemotherapy every 3 weeks. 09/25/15  Yes Curt Bears, MD  PROAIR HFA 108 [phone] Base) MCG/ACT inhaler Take 2 puffs by mouth every 6 (six) hours as needed for wheezing or shortness of breath.  01/08/16  Yes Historical Provider, MD  prochlorperazine (COMPAZINE) 10 MG tablet Take 1 tablet (10 mg total) by mouth every 6 (six) hours as needed for nausea or vomiting. 09/25/15  Yes Curt Bears, MD  tiotropium (SPIRIVA) 18 MCG inhalation capsule Place 1 capsule (18 mcg total) into inhaler and inhale daily. 08/27/15  Yes Tammy S Parrett, NP  magic mouthwash w/lidocaine SOLN Take 5 mLs by mouth 3 (three) times daily. Patient not taking: Reported on 01/14/2016 10/08/15   Owens Shark, NP   BP 97/58 mmHg  Pulse 101  Temp(Src) 102.3 F (39.1 C) (Rectal)  Resp 18  Ht 6' (1.829 m)  Wt 220 lb (99.791 kg)  BMI 29.83 kg/m2  SpO2 91% Physical Exam  Constitutional: He is oriented to person, place, and time. He appears well-developed. He appears distressed (He is uncomfortable).  He appears older than stated age.  HENT:  Head: Normocephalic and atraumatic.  Right Ear: External ear normal.  Left Ear: External ear normal.  Eyes: Conjunctivae and EOM are normal. Pupils are equal, round, and reactive to light.  Neck: Normal range of motion and phonation normal. Neck supple.  Cardiovascular: Normal rate, regular rhythm and normal heart sounds.   Pulmonary/Chest: Effort normal. No respiratory distress. He has no wheezes. He exhibits no bony tenderness.  Few scattered rhonchi. No increased work of breathing.  Abdominal: Soft. There is no  tenderness.  Musculoskeletal: Normal range of motion.  Neurological: He is alert and oriented to person, place, and time. No sensory deficit. He exhibits normal muscle tone. Coordination normal.  He is hard of hearing  Skin: Skin is warm, dry and intact.  Psychiatric: He has a normal mood and affect. His behavior is normal. Judgment and thought content normal.  Nursing note and vitals reviewed.   ED Course  Procedures (including critical care time)  Initial clinical impression- suspect hospital-acquired pneumonia, in immunocompromised patient, which will require comprehensive evaluation and perhaps treatment for sepsis. Will begin initial high-volume fluid bolus, during the assessment period.  Medications  vancomycin (VANCOCIN) 1,250 mg in sodium chloride 0.9 % 250 mL IVPB (not administered)  ceFEPIme (MAXIPIME) 2 g in dextrose 5 % 50 mL IVPB (not administered)  sodium chloride 0.9 % bolus 1,000 mL (not administered)  sodium chloride 0.9 % bolus 1,000 mL (0  mLs Intravenous Stopped 01/14/16 1408)  ceFEPIme (MAXIPIME) 2 g in dextrose 5 % 50 mL IVPB (0 g Intravenous Stopped 01/14/16 1140)  vancomycin (VANCOCIN) 2,000 mg in sodium chloride 0.9 % 500 mL IVPB (0 mg Intravenous Stopped 01/14/16 1408)    Patient Vitals for the past 24 hrs:  BP Temp Temp src Pulse Resp SpO2 Height Weight  01/14/16 1408 97/58 mmHg - - 101 18 91 % - -  01/14/16 1340 110/56 mmHg 102.3 F (39.1 C) Rectal 99 (!) 28 92 % - -  01/14/16 1200 (!) 84/59 mmHg - - 99 (!) 30 91 % - -  01/14/16 1145 91/56 mmHg - - 99 23 91 % - -  01/14/16 1131 (!) 89/59 mmHg - - 101 26 91 % - -  01/14/16 1130 (!) 89/59 mmHg - - 96 - 91 % - -  01/14/16 1115 (!) 83/61 mmHg 102.3 F (39.1 C) Rectal 109 25 (!) 83 % - -  01/14/16 1100 (!) 73/33 mmHg - - 112 24 (!) 87 % - -  01/14/16 1045 (!) 76/54 mmHg - - 114 - (!) 89 % - -  01/14/16 1030 (!) 78/49 mmHg - - 105 - 93 % - -  01/14/16 0930 100/58 mmHg - - - (!) 27 96 % 6' (1.829 m) 220 lb  (99.791 kg)  01/14/16 0920 (!) 88/52 mmHg 98.9 F (37.2 C) Oral 102 22 94 % - -    1:45 PM Reevaluation with update and discussion. After initial assessment and treatment, an updated evaluation reveals He feels better at this time. He is still on oxygen supplementation in the blood pressure is 100/60. Patient feels like he might have aspirated during the night because he had a "sweet taste in his mouth this morning. Findings discussed with the patient and his wife, all questions were answered. Nemiah Bubar L   1:49 PM-Consult complete with Hospitalist. Patient case explained and discussed. She agrees to admit patient for further evaluation and treatment. Call ended at 15:45   CRITICAL CARE Performed by: Richarda Blade Total critical care time: 40 minutes minutes Critical care time was exclusive of separately billable procedures and treating other patients. Critical care was necessary to treat or prevent imminent or life-threatening deterioration. Critical care was time spent personally by me on the following activities: development of treatment plan with patient and/or surrogate as well as nursing, discussions with consultants, evaluation of patient's response to treatment, examination of patient, obtaining history from patient or surrogate, ordering and performing treatments and interventions, ordering and review of laboratory studies, ordering and review of radiographic studies, pulse oximetry and re-evaluation of patient's condition.  Labs Review Labs Reviewed  COMPREHENSIVE METABOLIC PANEL - Abnormal; Notable for the following:    CO2 34 (*)    Glucose, Bld 127 (*)    BUN 22 (*)    Creatinine, Ser 1.43 (*)    Calcium 8.7 (*)    Total Protein 6.1 (*)    GFR calc non Af Amer 50 (*)    GFR calc Af Amer 58 (*)    All other components within normal limits  CBC WITH DIFFERENTIAL/PLATELET - Abnormal; Notable for the following:    WBC 34.9 (*)    RBC 3.21 (*)    Hemoglobin 9.3 (*)     HCT 30.3 (*)    RDW 16.9 (*)    Neutro Abs 33.5 (*)    Lymphs Abs 0.0 (*)    Monocytes Absolute 1.4 (*)    All  other components within normal limits  URINALYSIS, ROUTINE W REFLEX MICROSCOPIC (NOT AT [facility]) - Abnormal; Notable for the following:    Color, Urine AMBER (*)    Bilirubin Urine SMALL (*)    All other components within normal limits  I-STAT CG4 LACTIC ACID, ED - Abnormal; Notable for the following:    Lactic Acid, Venous 0.44 (*)    All other components within normal limits  CULTURE, BLOOD (ROUTINE X 2)  CULTURE, BLOOD (ROUTINE X 2)  URINE CULTURE  I-STAT CG4 LACTIC ACID, ED  I-STAT CG4 LACTIC ACID, ED    Imaging Review Dg Chest 2 View  01/14/2016  CLINICAL DATA:  Shortness of breath. EXAM: CHEST  2 VIEW COMPARISON:  September 08, 2015. FINDINGS: Stable cardiomediastinal silhouette. No pneumothorax is noted. Left lung is clear. Interval placement of right internal jugular Port-A-Cath with distal tip in expected position of the SVC. New right middle lobe airspace opacity is noted concerning for pneumonia or atelectasis. Bony thorax is unremarkable. IMPRESSION: New right middle lobe opacity concerning for pneumonia or atelectasis. Follow-up radiographs in 3-4 weeks after antibiotic therapy trial is recommended to ensure resolution and rule out underlying neoplasm or malignancy. Electronically Signed   By: Marijo Conception, M.D.   On: 01/14/2016 10:02   I have personally reviewed and evaluated these images and lab results as part of my medical decision-making.   EKG Interpretation None      MDM   Final diagnoses:  HCAP (healthcare-associated pneumonia)  Hypoxia  Sepsis, due to unspecified organism ([place])    Failure with abnormal chest x-ray, and new oxygen requirement. Likely pneumonia, considered to be HCAP because he is in current chemotherapy cycling. Blood pressure is low, however, lactate is reassuring. Doubt severe sepsis. Blood pressure did improve with IV fluids in  the emergency department. During the interim of waiting for the hospitalist, to return the call, he became hypotensive again. Therefore, additional IV fluids were ordered.  Nursing Notes Reviewed/ Care Coordinated, and agree without changes. Applicable Imaging Reviewed.  Interpretation of Laboratory Data incorporated into ED treatment   Plan: Admit    Daleen Bo, MD 01/14/16 1549

## 2016-01-14 NOTE — Progress Notes (Signed)
Pharmacy Antibiotic Note  [name] is a 65 y.o. male with stage III large B-cell non-Hodgkin lymphoma currently undergoing chemotherapy treatment who was supposed to get cycle 6 on 4/25, but presented to the Cancer center with c/o weakness, fatigue, fever, cough, and fever.  He was then transferred to [facility] for further workup. CXR done in the ED showed new R middle lobe opacity concerning for PNA or atelectasis. To start vancomycin and cefepime for sepsis/PNA.    Plan: - vancomycin 2000 mg IV x1, then 1250 mg IV q24h (goal 15-20) - cefepime 2gm IV q8h - f/u renal function  ________________________   Height: 6' (182.9 cm) Weight: 220 lb (99.791 kg) IBW/kg (Calculated) : 77.6  Temp (24hrs), Avg:98.5 F (36.9 C), Min:98 F (36.7 C), Max:98.9 F (37.2 C)   Recent Labs Lab 01/14/16 0844  WBC 31.4*  CREATININE 1.5*    Estimated Creatinine Clearance: 60.1 mL/min (by C-G formula based on Cr of 1.5).    No Known Allergies   Thank you for allowing pharmacy to be a part of this patient's care.  Lynelle Doctor 01/14/2016 10:06 AM

## 2016-01-14 NOTE — ED Notes (Addendum)
Gave patient urinal and notified him that we need a sample. Patient stated that he will call out when ready.

## 2016-01-14 NOTE — Progress Notes (Signed)
Pt arrived from ED on stretcher, slid self over to unit bed. VSS on 40%VM. Denies SOB/pain at present. Assessment as charted. POC discussed w/ pt and wife at bedside. Pt oriented to callbell and environment. Will monitor.

## 2016-01-14 NOTE — ED Notes (Signed)
Bed: WA07 Expected date:  Expected time:  Means of arrival:  Comments: Cancer center; possible sepsis

## 2016-01-14 NOTE — Progress Notes (Signed)
EDCM spoke to patient and his wife at bedside.  Patient lives at home with his wife [name] [phone].  Patient reports he uses 2 liters of oxygen at home, supplied by [employer].  Patient reports he does not have home health services at this time and never has.  Patient reports he is able to complete his ADL's on his own.  Patient does not have any other equipment at home.  Patient reports no problems with transportation or obtaining his medications.  Patient confirms his pcp is Dr. Jimmye Norman.  Patient presents to ED today from cancer center.  No further EDCM needs at this time.

## 2016-01-14 NOTE — Progress Notes (Signed)
Utilization Review completed.  Tulani Kidney RN CM  

## 2016-01-14 NOTE — Progress Notes (Signed)
[place] Telephone:(336) [phone]   Fax:(336) [place], [place] [address]  DIAGNOSIS: Stage III large B-cell non-Hodgkin lymphoma presenting with extensive lymphadenopathy involving the neck, chest, abdomen as well as splenomegaly diagnosed in December 2016.  PRIOR THERAPY:None  CURRENT THERAPY:Systemic chemotherapy with the CHOP/Rituxan every 3 weeks with Neulasta support. Status post 5 cycles.  INTERVAL HISTORY: [name] 65 y.o. male returns to the clinic today for follow-up visit accompanied by his wife. The patient is note doing well today. He presented to the clinic today complaining of fever started earlier today. The patient also had cough productive of yellowish sputum with blood-tinged earlier today. In the clinic she was found to be hypotensive with systolic in the 63K. His oxygen saturation was also down to 80% on 4 L nasal cannula. He has been tolerating his chemotherapy with CHOP/Rituxan fairly well with no significant adverse effects. He is complaining of increasing fatigue and weakness. No significant chest pain but has significant shortness of breath with cough. He has no nausea or vomiting, no diarrhea or constipation. He was supposed to start cycle #6 of his chemotherapy today.  MEDICAL HISTORY: Past Medical History  Diagnosis Date  . Streptococcal pneumonia [facility]) August 2009  . Congestive heart failure ([place])   . Gastrointestinal obstruction (HCC)     Ileus  . Pulmonary hypertension ([place])   . Polycythemia   . Hypothyroid   . COPD (chronic obstructive pulmonary disease) ([place])   . Obesity   . Gout   . BPH (benign prostatic hyperplasia) 2011  . Depression   . Hypertension   . Lung cancer ([place])   . NHL (non-Hodgkin's lymphoma) ([place]) 09/25/2015  . Encounter for antineoplastic chemotherapy 10/08/2015  . Drug-induced neutropenia ([place]) 10/08/2015    ALLERGIES:  has No Known  Allergies.  MEDICATIONS:  Current Outpatient Prescriptions  Medication Sig Dispense Refill  . albuterol (PROVENTIL) (2.5 MG/3ML) 0.083% nebulizer solution INHALE 3 MILLILITERS (2.5 MG) BY NEBULIZATION ROUTE EVERY 8 HOURS AS NEEDED  3  . allopurinol (ZYLOPRIM) 100 MG tablet Take 1 tablet (100 mg total) by mouth 2 (two) times daily. 60 tablet 4  . amLODipine (NORVASC) 5 MG tablet Take 1 tablet by mouth Daily.    [name] aspirin 325 MG tablet Take 325 mg by mouth daily.      . betamethasone dipropionate 0.05 % lotion Apply 1 drop topically 2 (two) times daily as needed.   3  . finasteride (PROSCAR) 5 MG tablet Take 5 mg by mouth daily.    [name] levothyroxine (SYNTHROID, LEVOTHROID) 150 MCG tablet Take 150 mcg by mouth daily.    [name] lidocaine-prilocaine (EMLA) cream Apply 1 application topically as needed. 30 g 0  . lisinopril (PRINIVIL,ZESTRIL) 10 MG tablet Take 1 tablet (10 mg total) by mouth daily. 30 tablet 11  . loratadine (CLARITIN) 10 MG tablet Take 10 mg by mouth daily. Reported on 11/12/2015    . magic mouthwash w/lidocaine SOLN Take 5 mLs by mouth 3 (three) times daily. 120 mL 1  . omeprazole (PRILOSEC) 20 MG capsule Take 1 tablet by mouth Twice daily.    [name] oxyCODONE-acetaminophen (PERCOCET) 10-325 MG tablet Take 1 tablet by mouth every 4 (four) hours as needed for pain.    . predniSONE (DELTASONE) 20 MG tablet Take 1 tablet (20 mg total) by mouth daily with breakfast. 30 tablet 0  . predniSONE (DELTASONE) 50 MG tablet  2 tablets by mouth daily for 5 days starting with the first dose of chemotherapy every 3 weeks. 80 tablet 0  . prochlorperazine (COMPAZINE) 10 MG tablet Take 1 tablet (10 mg total) by mouth every 6 (six) hours as needed for nausea or vomiting. 60 tablet 0  . tiotropium (SPIRIVA) 18 MCG inhalation capsule Place 1 capsule (18 mcg total) into inhaler and inhale daily. 30 capsule 6   No current facility-administered medications for this visit.    SURGICAL HISTORY:  Past Surgical  History  Procedure Laterality Date  . Appendectomy    . Back surgery    . Lung cancer surgery  2011  . Cardioversion  11/27/2011    Procedure: CARDIOVERSION;  Surgeon: Thayer Headings, MD;  Location: [facility] ENDOSCOPY;  Service: Cardiovascular;  Laterality: N/A;    REVIEW OF SYSTEMS:  Constitutional: positive for chills, fatigue, fevers and malaise Eyes: negative Ears, nose, mouth, throat, and face: negative Respiratory: positive for cough, dyspnea on exertion, sputum and wheezing Cardiovascular: negative Gastrointestinal: negative Genitourinary:negative Integument/breast: negative Hematologic/lymphatic: negative Musculoskeletal:positive for muscle weakness Neurological: negative Behavioral/Psych: negative Endocrine: negative Allergic/Immunologic: negative   PHYSICAL EXAMINATION: General appearance: alert, cooperative and no distress Head: Normocephalic, without obvious abnormality, atraumatic Neck: no adenopathy, no JVD, supple, symmetrical, trachea midline and thyroid not enlarged, symmetric, no tenderness/mass/nodules Lymph nodes: Cervical, supraclavicular, and axillary nodes normal. Resp: wheezes bilaterally Back: symmetric, no curvature. ROM normal. No CVA tenderness. Cardio: regular rate and rhythm, S1, S2 normal, no murmur, click, rub or gallop GI: soft, non-tender; bowel sounds normal; no masses,  no organomegaly Extremities: extremities normal, atraumatic, no cyanosis or edema Neurologic: Alert and oriented X 3, normal strength and tone. Normal symmetric reflexes. Normal coordination and gait  ECOG PERFORMANCE STATUS: 1 - Symptomatic but completely ambulatory  Blood pressure 64/33, pulse 110.  LABORATORY DATA: Lab Results  Component Value Date   WBC 31.4* 01/14/2016   HGB 10.0* 01/14/2016   HCT 32.1* 01/14/2016   MCV 93.4 01/14/2016   PLT 265 01/14/2016      Chemistry      Component Value Date/Time   NA 141 01/07/2016 0904   NA 143 10/03/2015 1204   K 5.3*  01/07/2016 0904   K 4.9 10/03/2015 1204   CL 103 10/03/2015 1204   CO2 35* 01/07/2016 0904   CO2 30 10/03/2015 1204   BUN 14.5 01/07/2016 0904   BUN 25* 10/03/2015 1204   CREATININE 1.1 01/07/2016 0904   CREATININE 0.82 10/03/2015 1204      Component Value Date/Time   CALCIUM 9.3 01/07/2016 0904   CALCIUM 9.0 10/03/2015 1204   ALKPHOS 89 01/07/2016 0904   ALKPHOS 72 10/23/2010 0358   AST 10 01/07/2016 0904   AST 14 10/23/2010 0358   ALT <9 01/07/2016 0904   ALT 17 10/23/2010 0358   BILITOT <0.30 01/07/2016 0904   BILITOT 0.4 10/23/2010 0358       RADIOGRAPHIC STUDIES: No results found.  ASSESSMENT AND PLAN: This is a very pleasant 65 years old white male diagnosed with a stage III large B-cell non-Hodgkin lymphoma and currently undergoing systemic chemotherapy with CHOP/Rituxan status post 5 cycles. He is tolerating his treatment fairly well with no significant adverse effect. Unfortunately the patient presented today with significant weakness and fatigue as well as fever and hypotension with cough and shortness breath. This is concerning for pneumonia and sepsis. I recommended for the patient to immediately to the emergency department for further evaluation and probably admission for treatment of  his questionable pneumonia. I will hold his systemic chemotherapy for now until improvement of his condition. For COPD, he is followed by Dr. Chase Caller.  The patient was advised to call immediately if he has any concerning symptoms in the interval. The patient voices understanding of current disease status and treatment options and is in agreement with the current care plan.  All questions were answered. The patient knows to call the clinic with any problems, questions or concerns. We can certainly see the patient much sooner if necessary.  Disclaimer: This note was dictated with voice recognition software. Similar sounding words can inadvertently be transcribed and may not be  corrected upon review.

## 2016-01-14 NOTE — ED Notes (Signed)
Patient was given glass of water to help with obtaining urine sample. Patient will call out when ready.

## 2016-01-14 NOTE — H&P (Signed)
[facility] History and Physical  MASYN ROSTRO ZOX:[id number] DOB: [birth year] DOA: 01/14/2016  Referring physician: Dr. Christ Kick, EDP PCP: [name], MD  Specialists: Dr. Julien Nordmann, oncologist Patient coming from: [facility]  Chief Complaint: Fever, cough  HPI: [name] is a 65 y.o. male with a medical history of non-Hodgkin's lymphoma, depression, hypertension, presented to the emergency department with complaints of fever and cough. Patient had presented to the [place] for his last infusion of chemotherapy for non-Hodgkin's lymphoma today, he was found to be hypotensive with fever and hypoxia, and sent to emergency department. Per wife, patient did have a fever of 101.31F yesterday. He has been coughing which started today. Patient also complained of shortness of breath which is worsened with exertion. He feels better when he sits up. Patient does have COPD and uses 2 L of home oxygen 24 hours daily. He feels that yesterday his acid reflux was worse overnight. He feels that he aspirated. It seems patient has had recurrent pneumonia since 2009 and has been taking Augmentin as needed for pneumonia. Currently patient denies chest pain, abdominal pain, nausea or vomiting, diarrhea or constipation, changes in urination, ill contacts or recent travel.  ED Course: In the emergency department, chest x-ray showed pneumonia. Patient was given vancomycin and cefepime. Patient was also found to have elevated white count, was febrile with hypotension.  Review of Systems:  As per HPI otherwise 10 point review of systems negative.   Past Medical History  Diagnosis Date  . Streptococcal pneumonia [facility]) August 2009  . Congestive heart failure ([place])   . Gastrointestinal obstruction (HCC)     Ileus  . Pulmonary hypertension ([place])   . Polycythemia   . Hypothyroid   . COPD (chronic obstructive pulmonary disease) ([place])   . Obesity   . Gout   . BPH (benign prostatic  hyperplasia) 2011  . Depression   . Hypertension   . Lung cancer ([place])   . NHL (non-Hodgkin's lymphoma) ([place]) 09/25/2015  . Encounter for antineoplastic chemotherapy 10/08/2015  . Drug-induced neutropenia ([place]) 10/08/2015  . Sepsis ([place]) 01/14/2016    Past Surgical History  Procedure Laterality Date  . Appendectomy    . Back surgery    . Lung cancer surgery  2011  . Cardioversion  11/27/2011    Procedure: CARDIOVERSION;  Surgeon: Thayer Headings, MD;  Location: [facility] ENDOSCOPY;  Service: Cardiovascular;  Laterality: N/A;    Social History:  reports that he quit smoking about 14 years ago. His smoking use included Cigarettes. He has a 129 pack-year smoking history. He has never used smokeless tobacco. He reports that he does not drink alcohol or use illicit drugs.   No Known Allergies  Family History  Problem Relation Age of Onset  . Heart disease Father   . Cancer Neg Hx   . Diabetes Neg Hx   . Stroke Neg Hx   . Hypertension Neg Hx   . Kidney disease Neg Hx     Prior to Admission medications   Medication Sig Start Date End Date Taking? Authorizing Provider  albuterol (PROVENTIL) (2.5 MG/3ML) 0.083% nebulizer solution INHALE 3 MILLILITERS (2.5 MG) BY NEBULIZATION ROUTE EVERY 8 HOURS AS NEEDED for shortness of breath 08/30/15  Yes Historical Provider, MD  allopurinol (ZYLOPRIM) 100 MG tablet Take 1 tablet (100 mg total) by mouth 2 (two) times daily. Patient taking differently: Take 100 mg by mouth 2 (two) times daily as needed (pain).  09/25/15  Yes Curt Bears,  MD  amLODipine (NORVASC) 5 MG tablet Take 1 tablet by mouth Daily. 03/09/11  Yes Historical Provider, MD  amoxicillin-clavulanate (AUGMENTIN) 875-125 MG tablet Take 1 tablet by mouth 2 (two) times daily.   Yes Historical Provider, MD  aspirin 325 MG tablet Take 325 mg by mouth daily.     Yes Historical Provider, MD  escitalopram (LEXAPRO) 5 MG tablet Take 5 mg by mouth daily. 12/09/15  Yes Historical Provider, MD  finasteride  (PROSCAR) 5 MG tablet Take 5 mg by mouth daily.   Yes Historical Provider, MD  levothyroxine (SYNTHROID, LEVOTHROID) 150 MCG tablet Take 150 mcg by mouth daily. 11/07/15  Yes Historical Provider, MD  lidocaine-prilocaine (EMLA) cream Apply 1 application topically as needed. 09/25/15  Yes Curt Bears, MD  lisinopril (PRINIVIL,ZESTRIL) 10 MG tablet Take 1 tablet (10 mg total) by mouth daily. 12/29/11  Yes Thayer Headings, MD  loratadine (CLARITIN) 10 MG tablet Take 10 mg by mouth daily. Reported on 11/12/2015   Yes Historical Provider, MD  omeprazole (PRILOSEC) 20 MG capsule Take 1 tablet by mouth Twice daily. 07/01/12  Yes Historical Provider, MD  oxyCODONE-acetaminophen (PERCOCET) 10-325 MG tablet Take 1 tablet by mouth every 4 (four) hours as needed for pain.   Yes Historical Provider, MD  predniSONE (DELTASONE) 20 MG tablet Take 1 tablet (20 mg total) by mouth daily with breakfast. 09/19/15  Yes Chesley Mires, MD  predniSONE (DELTASONE) 50 MG tablet 2 tablets by mouth daily for 5 days starting with the first dose of chemotherapy every 3 weeks. 09/25/15  Yes Curt Bears, MD  PROAIR HFA 108 [id number] Base) MCG/ACT inhaler Take 2 puffs by mouth every 6 (six) hours as needed for wheezing or shortness of breath.  01/08/16  Yes Historical Provider, MD  prochlorperazine (COMPAZINE) 10 MG tablet Take 1 tablet (10 mg total) by mouth every 6 (six) hours as needed for nausea or vomiting. 09/25/15  Yes Curt Bears, MD  tiotropium (SPIRIVA) 18 MCG inhalation capsule Place 1 capsule (18 mcg total) into inhaler and inhale daily. 08/27/15  Yes Tammy S Parrett, NP  magic mouthwash w/lidocaine SOLN Take 5 mLs by mouth 3 (three) times daily. Patient not taking: Reported on 01/14/2016 10/08/15   Owens Shark, NP    Physical Exam: Filed Vitals:   01/14/16 1615 01/14/16 1700  BP: 103/63 123/85  Pulse: 93 86  Temp:    Resp: 22 14     General: Well developed, well nourished, NAD, appears stated age  HEENT: NCAT,  PERRLA, EOMI, Anicteic Sclera, mucous membranes dry.   Neck: Supple, no JVD, no masses  Cardiovascular: S1 S2 auscultated, no rubs, murmurs or gallops. Regular rate and rhythm.  Respiratory: Diminished breath sounds, coarse breath sounds in the right lower lobe, productive cough with yellow sputum  Abdomen: Soft, nontender, nondistended, + bowel sounds  Extremities: warm dry without cyanosis clubbing or edema  Neuro: AAOx3, cranial nerves grossly intact. Strength 5/5 in patient's upper and lower extremities bilaterally  Skin: Without rashes exudates or nodules  Psych: Normal affect and demeanor with intact judgement and insight  Labs on Admission: I have personally reviewed following labs and imaging studies CBC:  Recent Labs Lab 01/14/16 0844 01/14/16 1029  WBC 31.4* 34.9*  NEUTROABS 28.4* 33.5*  HGB 10.0* 9.3*  HCT 32.1* 30.3*  MCV 93.4 94.4  PLT 265 703   Basic Metabolic Panel:  Recent Labs Lab 01/14/16 0844 01/14/16 1029  NA 141 141  K 4.9 5.1  CL  --  101  CO2 33* 34*  GLUCOSE 117 127*  BUN 20.1 22*  CREATININE 1.5* 1.43*  CALCIUM 9.0 8.7*   GFR: Estimated Creatinine Clearance: 63.2 mL/min (by C-G formula based on Cr of 1.43). Liver Function Tests:  Recent Labs Lab 01/14/16 0844 01/14/16 1029  AST 15 18  ALT 53 49  ALKPHOS 124 109  BILITOT 0.34 0.3  PROT 5.9* 6.1*  ALBUMIN 3.3* 3.5   No results for input(s): LIPASE, AMYLASE in the last 168 hours. No results for input(s): AMMONIA in the last 168 hours. Coagulation Profile: No results for input(s): INR, PROTIME in the last 168 hours. Cardiac Enzymes: No results for input(s): CKTOTAL, CKMB, CKMBINDEX, TROPONINI in the last 168 hours. BNP (last 3 results) No results for input(s): PROBNP in the last 8760 hours. HbA1C: No results for input(s): HGBA1C in the last 72 hours. CBG: No results for input(s): GLUCAP in the last 168 hours. Lipid Profile: No results for input(s): CHOL, HDL, LDLCALC,  TRIG, CHOLHDL, LDLDIRECT in the last 72 hours. Thyroid Function Tests: No results for input(s): TSH, T4TOTAL, FREET4, T3FREE, THYROIDAB in the last 72 hours. Anemia Panel: No results for input(s): VITAMINB12, FOLATE, FERRITIN, TIBC, IRON, RETICCTPCT in the last 72 hours. Urine analysis:    Component Value Date/Time   COLORURINE AMBER* 01/14/2016 1357   APPEARANCEUR CLEAR 01/14/2016 1357   LABSPEC 1.024 01/14/2016 1357   PHURINE 5.5 01/14/2016 1357   GLUCOSEU NEGATIVE 01/14/2016 1357   GLUCOSEU NEGATIVE 12/03/2011 1024   HGBUR NEGATIVE 01/14/2016 1357   BILIRUBINUR SMALL* 01/14/2016 1357   KETONESUR NEGATIVE 01/14/2016 1357   PROTEINUR NEGATIVE 01/14/2016 1357   UROBILINOGEN 0.2 12/03/2011 1024   NITRITE NEGATIVE 01/14/2016 1357   LEUKOCYTESUR NEGATIVE 01/14/2016 1357   Sepsis Labs: '@LABRCNTIP'$ (procalcitonin:4,lacticidven:4) ) Recent Results (from the past 240 hour(s))  Culture, blood (Routine x 2)     Status: None (Preliminary result)   Collection Time: 01/14/16 10:25 AM  Result Value Ref Range Status   Specimen Description   Final    BLOOD RIGHT PORTA CATH CHEST Performed at [facility]    Special Requests BOTTLES DRAWN AEROBIC AND ANAEROBIC 5ML  Final   Culture PENDING  Incomplete   Report Status PENDING  Incomplete     Radiological Exams on Admission: Dg Chest 2 View  01/14/2016  CLINICAL DATA:  Shortness of breath. EXAM: CHEST  2 VIEW COMPARISON:  September 08, 2015. FINDINGS: Stable cardiomediastinal silhouette. No pneumothorax is noted. Left lung is clear. Interval placement of right internal jugular Port-A-Cath with distal tip in expected position of the SVC. New right middle lobe airspace opacity is noted concerning for pneumonia or atelectasis. Bony thorax is unremarkable. IMPRESSION: New right middle lobe opacity concerning for pneumonia or atelectasis. Follow-up radiographs in 3-4 weeks after antibiotic therapy trial is recommended to ensure resolution and  rule out underlying neoplasm or malignancy. Electronically Signed   By: Marijo Conception, M.D.   On: 01/14/2016 10:02    EKG: Independently reviewed. Sinus rhythm, rate 97, for baseline  Assessment/Plan Severe sepsis secondary to aspiration pneumonia -Upon admission, patient was tachycardic with leukocytosis and febrile. Patient also had acute kidney injury. -Patient was hypotensive upon admission, however blood pressure responded to IV fluids -Chest x-ray showed new right middle lobe opacity concerning for pneumonia and recommended repeat chest x-ray in 3-4 weeks -Seems to be an ongoing issue since 2009.  Patient has a recurring Augmentin prescription for pneumonia. -Will place on vancomycin and Zosyn, IV fluids -Will obtain  blood cultures, sputum culture, strep pneumoniae urine antigen -Speech consulted for swallow eval  Acute kidney injury -Upon admission, creatinine 1.5 -Based and creatinine appears to be 0.9-1 -Likely secondary to sepsis, will place on IV fluids and monitor BMP  Non-Hodgkin's lymphoma -Patient was supposed to receive his last dose of chemotherapy today. He follows with Dr. Earlie Server. He received Neulasta approximately 3 weeks ago. -Dr. Earlie Server aware of patient's admission.  Acute on Chronic respiratory failure/COPD -SpO2 83% earlier today -Patient uses 2 L of home oxygen 24 hours daily. -Currently maintaining oxygen saturations in the 90s. -Continue to monitor -Continue home medications -Hold home steroids  Possible adrenal insufficiency -Given soft blood pressure, will place on Solu-Cortef  Gout -Continue allopurinol  Hypertension -Hold amlodipine, and lisinopril -Patient was hypotensive upon admission  BPH -Continue Proscar  Hypothyroidism  -Continue Synthroid  GERD -Continue PPI  Depression -Continue Lexapro  DVT prophylaxis: Lovenox  Code Status: Full  Family Communication: WIfe at bedside. Admission, patients condition and plan of  care including tests being ordered have been discussed with the patient and Wife who indicate understanding and agree with the plan and Code Status.  Disposition Plan: Admitted to stepdown   Consults called: None   Admission status: Inpatient   Time spent: 70 minutes  Nargis Abrams D.O. [facility] Pager [phone]  If 7PM-7AM, please contact night-coverage [email or web address] Password [facility] 01/14/2016, 5:15 PM

## 2016-01-15 DIAGNOSIS — J9611 Chronic respiratory failure with hypoxia: Secondary | ICD-10-CM

## 2016-01-15 DIAGNOSIS — E039 Hypothyroidism, unspecified: Secondary | ICD-10-CM | POA: Diagnosis not present

## 2016-01-15 DIAGNOSIS — J440 Chronic obstructive pulmonary disease with acute lower respiratory infection: Secondary | ICD-10-CM | POA: Diagnosis not present

## 2016-01-15 DIAGNOSIS — N179 Acute kidney failure, unspecified: Secondary | ICD-10-CM | POA: Diagnosis not present

## 2016-01-15 LAB — BASIC METABOLIC PANEL
Anion gap: 5 (ref 5–15)
BUN: 17 mg/dL (ref 6–20)
CALCIUM: 8.5 mg/dL — AB (ref 8.9–10.3)
CO2: 29 mmol/L (ref 22–32)
CREATININE: 0.9 mg/dL (ref 0.61–1.24)
Chloride: 105 mmol/L (ref 101–111)
GFR calc non Af Amer: >60 mL/min (ref >60)
GLUCOSE: 220 mg/dL — AB (ref 65–99)
Potassium: 4.4 mmol/L (ref 3.5–5.1)
Sodium: 139 mmol/L (ref 135–145)

## 2016-01-15 LAB — URINE CULTURE: CULTURE: NO GROWTH

## 2016-01-15 LAB — HIV ANTIBODY (ROUTINE TESTING W REFLEX): HIV Screen 4th Generation wRfx: NONREACTIVE

## 2016-01-15 LAB — CBC
HEMATOCRIT: 26.3 % — AB (ref 39.0–52.0)
Hemoglobin: 8.3 g/dL — ABNORMAL LOW (ref 13.0–17.0)
MCH: 30.4 pg (ref 26.0–34.0)
MCHC: 31.6 g/dL (ref 30.0–36.0)
MCV: 96.3 fL (ref 78.0–100.0)
Platelets: 160 10*3/uL (ref 150–400)
RBC: 2.73 MIL/uL — ABNORMAL LOW (ref 4.22–5.81)
RDW: 17.2 % — AB (ref 11.5–15.5)
WBC: 16.5 10*3/uL — ABNORMAL HIGH (ref 4.0–10.5)

## 2016-01-15 MED ORDER — OXYCODONE-ACETAMINOPHEN 5-325 MG PO TABS
1.0000 | ORAL_TABLET | ORAL | Status: DC | PRN
  Administered 2016-01-15: 1 via ORAL
  Administered 2016-01-15 – 2016-01-16 (×3): 2 via ORAL
  Administered 2016-01-17: 1 via ORAL
  Administered 2016-01-17: 2 via ORAL
  Filled 2016-01-15 (×5): qty 2
  Filled 2016-01-15: qty 1

## 2016-01-15 MED ORDER — AZITHROMYCIN 250 MG PO TABS
500.0000 mg | ORAL_TABLET | Freq: Every day | ORAL | Status: DC
Start: 2016-01-15 — End: 2016-01-17
  Administered 2016-01-15 – 2016-01-17 (×3): 500 mg via ORAL
  Filled 2016-01-15 (×4): qty 2

## 2016-01-15 MED ORDER — BACITRACIN-NEOMYCIN-POLYMYXIN OINTMENT TUBE
TOPICAL_OINTMENT | Freq: Two times a day (BID) | CUTANEOUS | Status: DC
  Administered 2016-01-15 – 2016-01-16 (×3): via TOPICAL
  Filled 2016-01-15 (×2): qty 15

## 2016-01-15 MED ORDER — HYDROCORTISONE NA SUCCINATE PF 100 MG IJ SOLR
50.0000 mg | INTRAMUSCULAR | Status: DC
  Administered 2016-01-16: 50 mg via INTRAVENOUS
  Filled 2016-01-15: qty 2

## 2016-01-15 MED ORDER — ENOXAPARIN SODIUM 40 MG/0.4ML ~~LOC~~ SOLN
40.0000 mg | SUBCUTANEOUS | Status: DC
  Administered 2016-01-15 – 2016-01-16 (×2): 40 mg via SUBCUTANEOUS
  Filled 2016-01-15 (×2): qty 0.4

## 2016-01-15 NOTE — Progress Notes (Signed)
PHARMACY CONSULT: Lovenox for VTE prophylaxis   Wt: 100kg BMI:  30 Scr:  1.43 CrCl >30 ml/hr (63 ml/min) H/H: 9.3/30.3 Pltc: 215  A/P:  Patient is currently appropriate for standard-dose Lovenox '40mg'$  SQ q24h Monitor CBC and renal function, adjust as needed  Peggyann Juba, PharmD, BCPS Pager: [phone] 01/15/2016 7:41 AM

## 2016-01-15 NOTE — Progress Notes (Signed)
PROGRESS NOTE    [name]  MWU:[id number] DOB: 1950/11/14 DOA: 01/14/2016 PCP: [name], MD  Outpatient Specialists:Dr Reklaw.      Brief Narrative: [name] is a 65 y.o. male with a medical history of non-Hodgkin's lymphoma, depression, hypertension, presented to the emergency department with complaints of fever and cough.  Patient had presented to the [place] for his last infusion of chemotherapy for non-Hodgkin's lymphoma the day of admission and , he was found to be hypotensive with fever and hypoxia, and sent to emergency department. Chest x ray consistent with PNA.     Assessment & Plan:   Active Problems:   Essential hypertension   Other dysphagia   Recurrent pneumonia   Hypothyroidism   Chronic respiratory failure with hypoxia (HCC)   NHL (non-Hodgkin's lymphoma) (HCC)   Chronic obstructive pulmonary disease (HCC)   Sepsis (HCC)   AKI (acute kidney injury) ([place])   Severe sepsis (HCC)  Severe sepsis secondary to  pneumonia There were concern with aspiration PNA.  Continue with Vancomycin and Zosyn.  Will add Azithro to cover atypical.   Acute kidney injury -Upon admission, creatinine 1.5 -Based and creatinine appears to be 0.9-1 -Likely secondary to sepsis.  -continue with IV fluids.  -Cr trending down.   Non-Hodgkin's lymphoma -Patient was supposed to receive his last dose of chemotherapy today. He follows with Dr. Earlie Server. He received Neulasta approximately 3 weeks ago. -Dr. Earlie Server aware of patient's admission.  Acute on Chronic respiratory failure/COPD -Patient uses 2 L of home oxygen 24 hours daily. -Continue to monitor -Continue home medications -Hold home steroids  Possible adrenal insufficiency -taper hydrocortisone.   Gout -Continue allopurinol  Hypertension -Hold amlodipine, and lisinopril -Patient was hypotensive upon admission  BPH -Continue Proscar  Hypothyroidism  -Continue Synthroid  GERD -Continue  PPI  Depression -Continue Lexapro  DVT prophylaxis: Lovenox.  Code Status: Full code.  Family Communication: wife and daughter at bedside.  Disposition Plan: if BP remain stable might be able to transfer patient to regular bed in 24 hours. Home in 2 to 3 days.    Consultants:   Dr Julien Nordmann.    Procedures: none  Antimicrobials:  Vancomycin 4-25  Zosyn 4-25  Azithromycin 4-26   Subjective: He is feeling better. Report productive cough, some improvement of cough. Denies dyspnea.   Objective: Filed Vitals:   01/15/16 0300 01/15/16 0400 01/15/16 0500 01/15/16 0600  BP:  138/41  123/49  Pulse: 70 73 56 74  Temp:  98.6 F (37 C)    TempSrc:  Oral    Resp: '21 20 19 23  '$ Height:      Weight:      SpO2: 99% 96% 100% 99%    Intake/Output Summary (Last 24 hours) at 01/15/16 0718 Last data filed at 01/15/16 0600  Gross per 24 hour  Intake 1168.75 ml  Output   1500 ml  Net -331.25 ml   Filed Weights   01/14/16 0930 01/14/16 1700  Weight: 99.791 kg (220 lb) 100.5 kg (221 lb 9 oz)    Examination:  General exam: Appears calm and comfortable  Respiratory system:  Respiratory effort normal. Bilateral crackles.  Cardiovascular system: S1 & S2 heard, RRR. No JVD, murmurs, rubs, gallops or clicks. No pedal edema. Gastrointestinal system: Abdomen is nondistended, soft and nontender. No organomegaly or masses felt. Normal bowel sounds heard. Central nervous system: Alert and oriented. No focal neurological deficits. Extremities: Symmetric 5 x 5 power. Skin: left hand with small  wound   Data Reviewed: I have personally reviewed following labs and imaging studies  CBC:  Recent Labs Lab 01/14/16 0844 01/14/16 1029  WBC 31.4* 34.9*  NEUTROABS 28.4* 33.5*  HGB 10.0* 9.3*  HCT 32.1* 30.3*  MCV 93.4 94.4  PLT 265 950   Basic Metabolic Panel:  Recent Labs Lab 01/14/16 0844 01/14/16 1029  NA 141 141  K 4.9 5.1  CL  --  101  CO2 33* 34*  GLUCOSE 117 127*  BUN  20.1 22*  CREATININE 1.5* 1.43*  CALCIUM 9.0 8.7*   GFR: Estimated Creatinine Clearance: 63.2 mL/min (by C-G formula based on Cr of 1.43). Liver Function Tests:  Recent Labs Lab 01/14/16 0844 01/14/16 1029  AST 15 18  ALT 53 49  ALKPHOS 124 109  BILITOT 0.34 0.3  PROT 5.9* 6.1*  ALBUMIN 3.3* 3.5   No results for input(s): LIPASE, AMYLASE in the last 168 hours. No results for input(s): AMMONIA in the last 168 hours. Coagulation Profile:  Recent Labs Lab 01/14/16 1711  INR 1.08   Cardiac Enzymes: No results for input(s): CKTOTAL, CKMB, CKMBINDEX, TROPONINI in the last 168 hours. BNP (last 3 results) No results for input(s): PROBNP in the last 8760 hours. HbA1C: No results for input(s): HGBA1C in the last 72 hours. CBG: No results for input(s): GLUCAP in the last 168 hours. Lipid Profile: No results for input(s): CHOL, HDL, LDLCALC, TRIG, CHOLHDL, LDLDIRECT in the last 72 hours. Thyroid Function Tests: No results for input(s): TSH, T4TOTAL, FREET4, T3FREE, THYROIDAB in the last 72 hours. Anemia Panel: No results for input(s): VITAMINB12, FOLATE, FERRITIN, TIBC, IRON, RETICCTPCT in the last 72 hours. Urine analysis:    Component Value Date/Time   COLORURINE AMBER* 01/14/2016 1357   APPEARANCEUR CLEAR 01/14/2016 1357   LABSPEC 1.024 01/14/2016 1357   PHURINE 5.5 01/14/2016 1357   GLUCOSEU NEGATIVE 01/14/2016 1357   GLUCOSEU NEGATIVE 12/03/2011 1024   HGBUR NEGATIVE 01/14/2016 1357   BILIRUBINUR SMALL* 01/14/2016 1357   KETONESUR NEGATIVE 01/14/2016 1357   PROTEINUR NEGATIVE 01/14/2016 1357   UROBILINOGEN 0.2 12/03/2011 1024   NITRITE NEGATIVE 01/14/2016 1357   LEUKOCYTESUR NEGATIVE 01/14/2016 1357   Sepsis Labs: '@LABRCNTIP'$ (procalcitonin:4,lacticidven:4)  ) Recent Results (from the past 240 hour(s))  Culture, blood (Routine x 2)     Status: None (Preliminary result)   Collection Time: 01/14/16 10:25 AM  Result Value Ref Range Status   Specimen Description    Final    BLOOD RIGHT PORTA CATH CHEST Performed at [facility]    Special Requests BOTTLES DRAWN AEROBIC AND ANAEROBIC 5ML  Final   Culture PENDING  Incomplete   Report Status PENDING  Incomplete  MRSA PCR Screening     Status: None   Collection Time: 01/14/16  5:00 PM  Result Value Ref Range Status   MRSA by PCR NEGATIVE NEGATIVE Final    Comment:        The GeneXpert MRSA Assay (FDA approved for NASAL specimens only), is one component of a comprehensive MRSA colonization surveillance program. It is not intended to diagnose MRSA infection nor to guide or monitor treatment for MRSA infections.   Culture, sputum-assessment     Status: None   Collection Time: 01/14/16  6:11 PM  Result Value Ref Range Status   Specimen Description SPUTUM  Final   Special Requests NONE  Final   Sputum evaluation   Final    THIS SPECIMEN IS ACCEPTABLE. RESPIRATORY CULTURE REPORT TO FOLLOW.   Report Status  01/14/2016 FINAL  Final         Radiology Studies: Dg Chest 2 View  01/14/2016  CLINICAL DATA:  Shortness of breath. EXAM: CHEST  2 VIEW COMPARISON:  September 08, 2015. FINDINGS: Stable cardiomediastinal silhouette. No pneumothorax is noted. Left lung is clear. Interval placement of right internal jugular Port-A-Cath with distal tip in expected position of the SVC. New right middle lobe airspace opacity is noted concerning for pneumonia or atelectasis. Bony thorax is unremarkable. IMPRESSION: New right middle lobe opacity concerning for pneumonia or atelectasis. Follow-up radiographs in 3-4 weeks after antibiotic therapy trial is recommended to ensure resolution and rule out underlying neoplasm or malignancy. Electronically Signed   By: Marijo Conception, M.D.   On: 01/14/2016 10:02        Scheduled Meds: . allopurinol  100 mg Oral BID  . antiseptic oral rinse  7 mL Mouth Rinse q12n4p  . aspirin  325 mg Oral Daily  . chlorhexidine  15 mL Mouth Rinse BID  . enoxaparin (LOVENOX)  injection  40 mg Subcutaneous Q24H  . escitalopram  5 mg Oral Daily  . finasteride  5 mg Oral Daily  . hydrocortisone sod succinate (SOLU-CORTEF) inj  50 mg Intravenous Q12H  . levothyroxine  150 mcg Oral QAC breakfast  . lisinopril  10 mg Oral Daily  . loratadine  10 mg Oral Daily  . pantoprazole  40 mg Oral Daily  . piperacillin-tazobactam (ZOSYN)  IV  3.375 g Intravenous Q8H  . tiotropium  18 mcg Inhalation Daily  . vancomycin  1,250 mg Intravenous Q24H   Continuous Infusions: . sodium chloride 75 mL/hr at 01/15/16 0052     LOS: 1 day    Time spent: 25 minutes.     Elmarie Shiley, MD [facility] Pager [phone]  If 7PM-7AM, please contact night-coverage [email or web address] Password [facility] 01/15/2016, 7:18 AM

## 2016-01-15 NOTE — Evaluation (Signed)
SLP Cancellation Note  Patient Details Name: [name] MRN: [id number] DOB: September 24, 1950   Cancelled treatment:       Reason Eval/Treat Not Completed: Other (comment);Patient at procedure or test/unavailable (pt being transferred to bathroom, will continue efforts)   Luanna Salk, [facility] SLP [phone]

## 2016-01-16 ENCOUNTER — Ambulatory Visit: Payer: PPO

## 2016-01-16 ENCOUNTER — Inpatient Hospital Stay (HOSPITAL_COMMUNITY): Payer: PPO

## 2016-01-16 DIAGNOSIS — N179 Acute kidney failure, unspecified: Secondary | ICD-10-CM | POA: Diagnosis not present

## 2016-01-16 DIAGNOSIS — E039 Hypothyroidism, unspecified: Secondary | ICD-10-CM

## 2016-01-16 DIAGNOSIS — J440 Chronic obstructive pulmonary disease with acute lower respiratory infection: Secondary | ICD-10-CM | POA: Diagnosis not present

## 2016-01-16 DIAGNOSIS — J9611 Chronic respiratory failure with hypoxia: Secondary | ICD-10-CM | POA: Diagnosis not present

## 2016-01-16 LAB — CBC
HCT: 25.7 % — ABNORMAL LOW (ref 39.0–52.0)
Hemoglobin: 8 g/dL — ABNORMAL LOW (ref 13.0–17.0)
MCH: 30.1 pg (ref 26.0–34.0)
MCHC: 31.1 g/dL (ref 30.0–36.0)
MCV: 96.6 fL (ref 78.0–100.0)
PLATELETS: 175 10*3/uL (ref 150–400)
RBC: 2.66 MIL/uL — AB (ref 4.22–5.81)
RDW: 17.2 % — AB (ref 11.5–15.5)
WBC: 9.7 10*3/uL (ref 4.0–10.5)

## 2016-01-16 LAB — BASIC METABOLIC PANEL
ANION GAP: 5 (ref 5–15)
BUN: 14 mg/dL (ref 6–20)
CO2: 33 mmol/L — AB (ref 22–32)
CREATININE: 0.97 mg/dL (ref 0.61–1.24)
Calcium: 8.9 mg/dL (ref 8.9–10.3)
Chloride: 108 mmol/L (ref 101–111)
GFR calc non Af Amer: >60 mL/min (ref >60)
Glucose, Bld: 115 mg/dL — ABNORMAL HIGH (ref 65–99)
POTASSIUM: 4.6 mmol/L (ref 3.5–5.1)
SODIUM: 146 mmol/L — AB (ref 135–145)

## 2016-01-16 MED ORDER — VANCOMYCIN HCL IN DEXTROSE 750-5 MG/150ML-% IV SOLN
750.0000 mg | Freq: Two times a day (BID) | INTRAVENOUS | Status: DC
  Administered 2016-01-16 (×2): 750 mg via INTRAVENOUS
  Filled 2016-01-16 (×4): qty 150

## 2016-01-16 MED ORDER — GUAIFENESIN ER 600 MG PO TB12
600.0000 mg | ORAL_TABLET | Freq: Two times a day (BID) | ORAL | Status: DC
  Administered 2016-01-16 – 2016-01-17 (×3): 600 mg via ORAL
  Filled 2016-01-16 (×3): qty 1

## 2016-01-16 MED ORDER — ALBUTEROL SULFATE (2.5 MG/3ML) 0.083% IN NEBU: 2.5000 mg | INHALATION_SOLUTION | RESPIRATORY_TRACT | Status: DC | PRN

## 2016-01-16 MED ORDER — PREDNISONE 20 MG PO TABS
40.0000 mg | ORAL_TABLET | Freq: Every day | ORAL | Status: DC
  Administered 2016-01-17: 40 mg via ORAL
  Filled 2016-01-16: qty 2

## 2016-01-16 MED ORDER — ALBUTEROL SULFATE (2.5 MG/3ML) 0.083% IN NEBU: 3.0000 mL | INHALATION_SOLUTION | Freq: Four times a day (QID) | RESPIRATORY_TRACT | Status: DC

## 2016-01-16 MED ORDER — PREDNISONE 20 MG PO TABS
60.0000 mg | ORAL_TABLET | Freq: Every day | ORAL | Status: DC
  Administered 2016-01-16: 60 mg via ORAL
  Filled 2016-01-16: qty 3

## 2016-01-16 MED ORDER — SACCHAROMYCES BOULARDII 250 MG PO CAPS
250.0000 mg | ORAL_CAPSULE | Freq: Two times a day (BID) | ORAL | Status: DC
  Administered 2016-01-16 – 2016-01-17 (×3): 250 mg via ORAL
  Filled 2016-01-16 (×3): qty 1

## 2016-01-16 MED ORDER — ALBUTEROL SULFATE (2.5 MG/3ML) 0.083% IN NEBU
3.0000 mL | INHALATION_SOLUTION | Freq: Three times a day (TID) | RESPIRATORY_TRACT | Status: DC
  Administered 2016-01-16 – 2016-01-17 (×5): 3 mL via RESPIRATORY_TRACT
  Filled 2016-01-16 (×4): qty 3

## 2016-01-16 NOTE — Progress Notes (Signed)
Report called to Josph Macho, Therapist, sports.  Pt transferred to 1331 via wheelchair with oxygen.

## 2016-01-16 NOTE — Procedures (Signed)
Objective Swallowing Evaluation: Type of Study: MBS-Modified Barium Swallow Study  Patient Details  Name: [name] MRN: [id number] Date of Birth: 1951-08-16  Today's Date: 01/16/2016 Time: SLP Start Time (ACUTE ONLY): 1430-SLP Stop Time (ACUTE ONLY): 1450 SLP Time Calculation (min) (ACUTE ONLY): 20 min  Past Medical History:  Past Medical History  Diagnosis Date  . Streptococcal pneumonia [facility]) August 2009  . Congestive heart failure ([place])   . Gastrointestinal obstruction (HCC)     Ileus  . Pulmonary hypertension ([place])   . Polycythemia   . Hypothyroid   . COPD (chronic obstructive pulmonary disease) ([place])   . Obesity   . Gout   . BPH (benign prostatic hyperplasia) 2011  . Depression   . Hypertension   . Lung cancer ([place])   . NHL (non-Hodgkin's lymphoma) ([place]) 09/25/2015  . Encounter for antineoplastic chemotherapy 10/08/2015  . Drug-induced neutropenia ([place]) 10/08/2015  . Sepsis ([place]) 01/14/2016   Past Surgical History:  Past Surgical History  Procedure Laterality Date  . Appendectomy    . Back surgery    . Lung cancer surgery  2011  . Cardioversion  11/27/2011    Procedure: CARDIOVERSION;  Surgeon: Thayer Headings, MD;  Location: [facility] ENDOSCOPY;  Service: Cardiovascular;  Laterality: N/A;   HPI: [name] is a 65 y.o. male with a medical history of non-Hodgkin's lymphoma, depression, hypertension, presented to the emergency department with complaints of fever and cough. Patient had presented to the [facility] for his last infusion of chemotherapy for non-Hodgkin's lymphoma the day of admission and , he was found to be hypotensive with fever and hypoxia, and sent to emergency department. Chest x ray consistent with PNA.   No Data Recorded  Assessment / Plan / Recommendation  CHL IP CLINICAL IMPRESSIONS 01/16/2016  Therapy Diagnosis Suspected primary esophageal dysphagia;Mild pharyngeal phase dysphagia  Clinical Impression Pt presents with mild pharyngeal based  dysphagia with intermittent laryngeal penetration of thin liquids and trace SILENT aspiration of sequential boluses of thin due to decreased laryngeal elevation/closure.  Cued throat clear removed barium from larynx and cued cough largely removed aspirates mixed with secretions.  Only minimal residuals present due to weakness in pharyngeal contraction.  PT reports he takes his medicine with applesauce - Barium tablet given with puree appeared to lodge in mid-esophagus, thin barium swallow did not aid clearance and resulted in retrograde propulsion - thin water consumption facilitated clearance - pt did NOT sense residuals nor appearance of refluxing.  Of note, pt does report reflux problems and states this occurs nearly every time before hospital admission.  He reports he ate pancakes Monday evening and refluxed over night = tasting syrup and bitter stomach acid contents.  Advised him to aspiration/reflux precautions to mitigate risk.  Provided his wife with material on heimlich maneuver and esophageal precautions.  Suspect pt's aspiration episode was due to esophageal issues/reflux based on MBS coupled with pt symptomology.    Impact on safety and function Mild aspiration risk;Other (comment)      No flowsheet data found.   Prognosis 01/16/2016  Prognosis for Safe Diet Advancement Guarded  Barriers to Reach Goals --  Barriers/Prognosis Comment --    CHL IP DIET RECOMMENDATION 01/16/2016  SLP Diet Recommendations Regular solids;Thin liquid  Liquid Administration via Cup;Straw  Medication Administration Whole meds with puree  Compensations Slow rate;Small sips/bites;Follow solids with liquid;Clear throat intermittently  Postural Changes Remain semi-upright after after feeds/meals (Comment);Seated upright at 90 degrees      CHL  IP OTHER RECOMMENDATIONS 01/16/2016  Recommended Consults --  Oral Care Recommendations Oral care BID  Other Recommendations --      CHL IP FOLLOW UP RECOMMENDATIONS  01/16/2016  Follow up Recommendations None      CHL IP FREQUENCY AND DURATION 01/16/2016  Speech Therapy Frequency (ACUTE ONLY) min 1 x/week  Treatment Duration 1 week           CHL IP ORAL PHASE 01/16/2016  Oral Phase WFL  Oral - Pudding Teaspoon --  Oral - Pudding Cup --  Oral - Honey Teaspoon --  Oral - Honey Cup --  Oral - Nectar Teaspoon --  Oral - Nectar Cup WFL  Oral - Nectar Straw --  Oral - Thin Teaspoon --  Oral - Thin Cup WFL  Oral - Thin Straw WFL  Oral - Puree WFL  Oral - Mech Soft --  Oral - Regular WFL  Oral - Multi-Consistency --  Oral - Pill WFL  Oral Phase - Comment --    CHL IP PHARYNGEAL PHASE 01/16/2016  Pharyngeal Phase Impaired  Pharyngeal- Pudding Teaspoon --  Pharyngeal --  Pharyngeal- Pudding Cup --  Pharyngeal --  Pharyngeal- Honey Teaspoon --  Pharyngeal --  Pharyngeal- Honey Cup --  Pharyngeal --  Pharyngeal- Nectar Teaspoon --  Pharyngeal --  Pharyngeal- Nectar Cup Pharyngeal residue - valleculae;Reduced airway/laryngeal closure;Reduced laryngeal elevation  Pharyngeal --  Pharyngeal- Nectar Straw --  Pharyngeal --  Pharyngeal- Thin Teaspoon --  Pharyngeal --  Pharyngeal- Thin Cup Pharyngeal residue - valleculae;Pharyngeal residue - pyriform;Reduced laryngeal elevation;Penetration/Aspiration during swallow;Reduced airway/laryngeal closure  Pharyngeal Material enters airway, remains ABOVE vocal cords then ejected out  Pharyngeal- Thin Straw Reduced laryngeal elevation;Reduced airway/laryngeal closure;Pharyngeal residue - valleculae;Pharyngeal residue - pyriform;Penetration/Aspiration during swallow  Pharyngeal Material enters airway, passes BELOW cords without attempt by patient to eject out (silent aspiration)  Pharyngeal- Puree WFL  Pharyngeal --  Pharyngeal- Mechanical Soft --  Pharyngeal --  Pharyngeal- Regular WFL  Pharyngeal --  Pharyngeal- Multi-consistency --  Pharyngeal --  Pharyngeal- Pill WFL  Pharyngeal --  Pharyngeal  Comment --     CHL IP CERVICAL ESOPHAGEAL PHASE 01/16/2016  Cervical Esophageal Phase Impaired  Pudding Teaspoon --  Pudding Cup --  Honey Teaspoon --  Honey Cup --  Nectar Teaspoon --  Nectar Cup Other (Comment)  Nectar Straw --  Thin Teaspoon --  Thin Cup Other (Comment)  Thin Straw Other (Comment)  Puree Other (Comment)  Mechanical Soft --  Regular Other (Comment)  Multi-consistency --  Pill Other (Comment)  Cervical Esophageal Comment barium tablet given with puree appeared to lodge in mid-esophagus, thin barium swallow did not aid clearance and resulted in retrograde propulsion - thin water consumption facilitated clearance - pt did NOT sense residuals nor appearance of refluxing   Luanna Salk, [place] [facility] SLP [phone]

## 2016-01-16 NOTE — Progress Notes (Signed)
Pharmacy Antibiotic Note  [name] is a 65 y.o. male with stage III large B-cell non-Hodgkin lymphoma currently undergoing chemotherapy treatment who was supposed to get cycle 6 on 4/25, but presented to the Cancer center with c/o weakness, fatigue, fever, cough, and fever. He was then transferred to [facility] where CXR showed new R middle lobe opacity concerning for PNA or atelectasis. To start vancomycin and cefepime for sepsis/PNA. Pharmacy was initially consulted to dose vancomycin and cefepime for sepsis/PNA, but has now been consulted for Vancomycin and Zosyn dosing for suspected aspiration pneumonia.  01/16/2016 Day #3 antibiotics  Cultures unrevealing but sputum culture remains in process.  Gram stain with GPC pairs.    SCr improved  Leukocytosis resolved  afebrile  Plan:  Continue Zosyn 3.375g IV Q8H infused over 4hrs at this time  Change vancomycin to '750mg'$  IV q12h for improved renal function.  Evaluate appropriateness to stop in light of negative MRSA PCR and GPC pairs in sputum culture.  Measure Vanc trough at steady state. Follow up renal fxn, culture results, and clinical course.  Height: 6' (182.9 cm) Weight: 221 lb 9 oz (100.5 kg) IBW/kg (Calculated) : 77.6  Temp (24hrs), Avg:98.4 F (36.9 C), Min:98 F (36.7 C), Max:98.7 F (37.1 C)   Recent Labs Lab 01/14/16 0844 01/14/16 1029 01/14/16 1040 01/14/16 1255 01/15/16 1100 01/16/16 0525  WBC 31.4* 34.9*  --   --  16.5* 9.7  CREATININE 1.5* 1.43*  --   --  0.90 0.97  LATICACIDVEN  --   --  1.33 0.44*  --   --     Estimated Creatinine Clearance: 93.2 mL/min (by C-G formula based on Cr of 0.97).    No Known Allergies  Antimicrobials this admission: 4/25 vanc >> 4/25 cefepime >> 4/25 4/25 Zosyn >>   Dose adjustments this admission:  Microbiology results: 4/25 BCx: NGTD 4/25 UCx: NG 4/25 Urine strep: neg 4/25 Sputum: few GPC pairs, re-incubated 4/25 HIV Ab: neg 4/25 MRSA PCR: neg  Thank you for  allowing pharmacy to be a part of this patient's care.  Doreene Eland, PharmD, BCPS.   Pager: [phone] 01/16/2016 9:54 AM

## 2016-01-16 NOTE — Progress Notes (Signed)
PROGRESS NOTE    [name]  HER:[id number] DOB: [birth year] DOA: 01/14/2016 PCP: [name], MD  Outpatient Specialists:Dr [place].      Brief Narrative: [name] is a 65 y.o. male with a medical history of non-Hodgkin's lymphoma, depression, hypertension, presented to the emergency department with complaints of fever and cough.  Patient had presented to the [facility] for his last infusion of chemotherapy for non-Hodgkin's lymphoma the day of admission and , he was found to be hypotensive with fever and hypoxia, and sent to emergency department. Chest x ray consistent with PNA.     Assessment & Plan:   Active Problems:   Essential hypertension   Other dysphagia   Recurrent pneumonia   Hypothyroidism   Chronic respiratory failure with hypoxia (HCC)   NHL (non-Hodgkin's lymphoma) (HCC)   Chronic obstructive pulmonary disease (HCC)   Sepsis (HCC)   AKI (acute kidney injury) ([place])   Severe sepsis (HCC)  Severe sepsis secondary to  pneumonia There were concern with aspiration PNA.  Continue with Vancomycin and Zosyn, Azithro to cover atypical.  Swallow evaluation.  Vitals stable transfer to med-surgery   Acute kidney injury -Upon admission, creatinine 1.5 -Based and creatinine appears to be 0.9-1 -Likely secondary to sepsis.  -resolved with IV fluids. Encourage oral intake for mildly elevated sodium.    Non-Hodgkin's lymphoma -Patient was supposed to receive his last dose of chemotherapy today. He follows with Dr. Earlie Server. He received Neulasta approximately 3 weeks ago. -Dr. Earlie Server aware of patient's admission. -on chronic prednisone per patient per Dr Julien Nordmann.   Acute on Chronic respiratory failure/COPD -Patient uses 2 L of home oxygen 24 hours daily. -Continue to monitor -Continue home medications -resume prednisone, schedule nebulizer.   Anemia; related to chemo. Follow trend. If continue to drop might need Blood transfusion.   Possible  adrenal insufficiency -taper hydrocortisone. Transition to prednisone.   Gout -Continue allopurinol  Hypertension -Hold amlodipine, and lisinopril -Patient was hypotensive upon admission  BPH -Continue Proscar  Hypothyroidism  -Continue Synthroid  GERD -Continue PPI  Depression -Continue Lexapro  DVT prophylaxis: Lovenox.  Code Status: Full code.  Family Communication: wife and daughter at bedside.  Disposition Plan: transfer to regular bed, discharge home 4-28   Consultants:   Dr Julien Nordmann.    Procedures: none  Antimicrobials:  Vancomycin 4-25  Zosyn 4-25  Azithromycin 4-26   Subjective: He is breathing better, cough has decrease infrequency. Having BM.   Objective: Filed Vitals:   01/15/16 2219 01/16/16 0000 01/16/16 0400 01/16/16 0539  BP: 130/68   130/71  Pulse: 90   87  Temp:  98.7 F (37.1 C) 98 F (36.7 C)   TempSrc:  Oral Oral   Resp: 20   24  Height:      Weight:      SpO2: 96%   90%    Intake/Output Summary (Last 24 hours) at 01/16/16 0752 Last data filed at 01/16/16 0530  Gross per 24 hour  Intake   1360 ml  Output    875 ml  Net    485 ml   Filed Weights   01/14/16 0930 01/14/16 1700  Weight: 99.791 kg (220 lb) 100.5 kg (221 lb 9 oz)    Examination:  General exam: Appears calm and comfortable  Respiratory system:  Respiratory effort normal. Crackles on the right, and wheezing on the right  Cardiovascular system: S1 & S2 heard, RRR. No JVD, murmurs, rubs, gallops or clicks. No pedal edema. Gastrointestinal  system: Abdomen is nondistended, soft and nontender. No organomegaly or masses felt. Normal bowel sounds heard. Central nervous system: Alert and oriented. No focal neurological deficits. Extremities: Symmetric 5 x 5 power. Skin: left hand with small wound   Data Reviewed: I have personally reviewed following labs and imaging studies  CBC:  Recent Labs Lab 01/14/16 0844 01/14/16 1029 01/15/16 1100 01/16/16 0525    WBC 31.4* 34.9* 16.5* 9.7  NEUTROABS 28.4* 33.5*  --   --   HGB 10.0* 9.3* 8.3* 8.0*  HCT 32.1* 30.3* 26.3* 25.7*  MCV 93.4 94.4 96.3 96.6  PLT 265 215 160 782   Basic Metabolic Panel:  Recent Labs Lab 01/14/16 0844 01/14/16 1029 01/15/16 1100 01/16/16 0525  NA 141 141 139 146*  K 4.9 5.1 4.4 4.6  CL  --  101 105 108  CO2 33* 34* 29 33*  GLUCOSE 117 127* 220* 115*  BUN 20.1 22* 17 14  CREATININE 1.5* 1.43* 0.90 0.97  CALCIUM 9.0 8.7* 8.5* 8.9   GFR: Estimated Creatinine Clearance: 93.2 mL/min (by C-G formula based on Cr of 0.97). Liver Function Tests:  Recent Labs Lab 01/14/16 0844 01/14/16 1029  AST 15 18  ALT 53 49  ALKPHOS 124 109  BILITOT 0.34 0.3  PROT 5.9* 6.1*  ALBUMIN 3.3* 3.5   No results for input(s): LIPASE, AMYLASE in the last 168 hours. No results for input(s): AMMONIA in the last 168 hours. Coagulation Profile:  Recent Labs Lab 01/14/16 1711  INR 1.08   Cardiac Enzymes: No results for input(s): CKTOTAL, CKMB, CKMBINDEX, TROPONINI in the last 168 hours. BNP (last 3 results) No results for input(s): PROBNP in the last 8760 hours. HbA1C: No results for input(s): HGBA1C in the last 72 hours. CBG: No results for input(s): GLUCAP in the last 168 hours. Lipid Profile: No results for input(s): CHOL, HDL, LDLCALC, TRIG, CHOLHDL, LDLDIRECT in the last 72 hours. Thyroid Function Tests: No results for input(s): TSH, T4TOTAL, FREET4, T3FREE, THYROIDAB in the last 72 hours. Anemia Panel: No results for input(s): VITAMINB12, FOLATE, FERRITIN, TIBC, IRON, RETICCTPCT in the last 72 hours. Urine analysis:    Component Value Date/Time   COLORURINE AMBER* 01/14/2016 1357   APPEARANCEUR CLEAR 01/14/2016 1357   LABSPEC 1.024 01/14/2016 1357   PHURINE 5.5 01/14/2016 1357   GLUCOSEU NEGATIVE 01/14/2016 1357   GLUCOSEU NEGATIVE 12/03/2011 1024   HGBUR NEGATIVE 01/14/2016 1357   BILIRUBINUR SMALL* 01/14/2016 1357   KETONESUR NEGATIVE 01/14/2016 1357    PROTEINUR NEGATIVE 01/14/2016 1357   UROBILINOGEN 0.2 12/03/2011 1024   NITRITE NEGATIVE 01/14/2016 1357   LEUKOCYTESUR NEGATIVE 01/14/2016 1357   Sepsis Labs: '@LABRCNTIP'$ (procalcitonin:4,lacticidven:4)  ) Recent Results (from the past 240 hour(s))  Culture, blood (Routine x 2)     Status: None (Preliminary result)   Collection Time: 01/14/16 10:25 AM  Result Value Ref Range Status   Specimen Description BLOOD RIGHT PORTA CATH CHEST  Final   Special Requests BOTTLES DRAWN AEROBIC AND ANAEROBIC 5ML  Final   Culture   Final    NO GROWTH 1 DAY Performed at [facility]    Report Status PENDING  Incomplete  Culture, blood (Routine x 2)     Status: None (Preliminary result)   Collection Time: 01/14/16 10:43 AM  Result Value Ref Range Status   Specimen Description BLOOD RIGHT ANTECUBITAL  Final   Special Requests BOTTLES DRAWN AEROBIC AND ANAEROBIC 5ML  Final   Culture   Final    NO GROWTH 1  DAY Performed at [facility]    Report Status PENDING  Incomplete  Urine culture     Status: None   Collection Time: 01/14/16  1:57 PM  Result Value Ref Range Status   Specimen Description URINE, CLEAN CATCH  Final   Special Requests NONE  Final   Culture   Final    NO GROWTH 1 DAY Performed at [facility]    Report Status 01/15/2016 FINAL  Final  MRSA PCR Screening     Status: None   Collection Time: 01/14/16  5:00 PM  Result Value Ref Range Status   MRSA by PCR NEGATIVE NEGATIVE Final    Comment:        The GeneXpert MRSA Assay (FDA approved for NASAL specimens only), is one component of a comprehensive MRSA colonization surveillance program. It is not intended to diagnose MRSA infection nor to guide or monitor treatment for MRSA infections.   Culture, sputum-assessment     Status: None   Collection Time: 01/14/16  6:11 PM  Result Value Ref Range Status   Specimen Description SPUTUM  Final   Special Requests NONE  Final   Sputum evaluation   Final     THIS SPECIMEN IS ACCEPTABLE. RESPIRATORY CULTURE REPORT TO FOLLOW.   Report Status 01/14/2016 FINAL  Final  Culture, respiratory (NON-Expectorated)     Status: None (Preliminary result)   Collection Time: 01/14/16  6:11 PM  Result Value Ref Range Status   Specimen Description SPUTUM  Final   Special Requests NONE  Final   Gram Stain   Final    RARE WBC PRESENT, PREDOMINANTLY PMN RARE SQUAMOUS EPITHELIAL CELLS PRESENT FEW GRAM POSITIVE COCCI IN PAIRS THIS SPECIMEN IS ACCEPTABLE FOR SPUTUM CULTURE Performed at [facility]    Culture PENDING  Incomplete   Report Status PENDING  Incomplete         Radiology Studies: Dg Chest 2 View  01/14/2016  CLINICAL DATA:  Shortness of breath. EXAM: CHEST  2 VIEW COMPARISON:  September 08, 2015. FINDINGS: Stable cardiomediastinal silhouette. No pneumothorax is noted. Left lung is clear. Interval placement of right internal jugular Port-A-Cath with distal tip in expected position of the SVC. New right middle lobe airspace opacity is noted concerning for pneumonia or atelectasis. Bony thorax is unremarkable. IMPRESSION: New right middle lobe opacity concerning for pneumonia or atelectasis. Follow-up radiographs in 3-4 weeks after antibiotic therapy trial is recommended to ensure resolution and rule out underlying neoplasm or malignancy. Electronically Signed   By: Marijo Conception, M.D.   On: 01/14/2016 10:02        Scheduled Meds: . albuterol  3 mL Inhalation Q6H  . allopurinol  100 mg Oral BID  . antiseptic oral rinse  7 mL Mouth Rinse q12n4p  . aspirin  325 mg Oral Daily  . azithromycin  500 mg Oral Daily  . chlorhexidine  15 mL Mouth Rinse BID  . enoxaparin (LOVENOX) injection  40 mg Subcutaneous Q24H  . escitalopram  5 mg Oral Daily  . finasteride  5 mg Oral Daily  . levothyroxine  150 mcg Oral QAC breakfast  . loratadine  10 mg Oral Daily  . neomycin-bacitracin-polymyxin   Topical BID  . pantoprazole  40 mg Oral Daily  .  piperacillin-tazobactam (ZOSYN)  IV  3.375 g Intravenous Q8H  . predniSONE  60 mg Oral Q breakfast  . saccharomyces boulardii  250 mg Oral BID  . tiotropium  18 mcg Inhalation Daily  .  vancomycin  1,250 mg Intravenous Q24H   Continuous Infusions:     LOS: 2 days    Time spent: 25 minutes.     Elmarie Shiley, MD [facility] Pager [phone]  If 7PM-7AM, please contact night-coverage [email or web address] Password [facility] 01/16/2016, 7:52 AM

## 2016-01-17 DIAGNOSIS — J440 Chronic obstructive pulmonary disease with acute lower respiratory infection: Secondary | ICD-10-CM | POA: Diagnosis not present

## 2016-01-17 DIAGNOSIS — E039 Hypothyroidism, unspecified: Secondary | ICD-10-CM | POA: Diagnosis not present

## 2016-01-17 DIAGNOSIS — J9611 Chronic respiratory failure with hypoxia: Secondary | ICD-10-CM | POA: Diagnosis not present

## 2016-01-17 DIAGNOSIS — N179 Acute kidney failure, unspecified: Secondary | ICD-10-CM | POA: Diagnosis not present

## 2016-01-17 LAB — CBC
HCT: 24.3 % — ABNORMAL LOW (ref 39.0–52.0)
HEMOGLOBIN: 7.7 g/dL — AB (ref 13.0–17.0)
MCH: 30.2 pg (ref 26.0–34.0)
MCHC: 31.7 g/dL (ref 30.0–36.0)
MCV: 95.3 fL (ref 78.0–100.0)
PLATELETS: 182 10*3/uL (ref 150–400)
RBC: 2.55 MIL/uL — AB (ref 4.22–5.81)
RDW: 16.5 % — ABNORMAL HIGH (ref 11.5–15.5)
WBC: 8.4 10*3/uL (ref 4.0–10.5)

## 2016-01-17 LAB — CULTURE, RESPIRATORY

## 2016-01-17 LAB — CULTURE, RESPIRATORY W GRAM STAIN: Culture: NORMAL

## 2016-01-17 LAB — ABO/RH: ABO/RH(D): A POS

## 2016-01-17 LAB — PREPARE RBC (CROSSMATCH)

## 2016-01-17 MED ORDER — GUAIFENESIN ER 600 MG PO TB12: 600.0000 mg | ORAL_TABLET | Freq: Two times a day (BID) | ORAL | Status: DC

## 2016-01-17 MED ORDER — SACCHAROMYCES BOULARDII 250 MG PO CAPS: 250.0000 mg | ORAL_CAPSULE | Freq: Two times a day (BID) | ORAL | Status: DC

## 2016-01-17 MED ORDER — FUROSEMIDE 10 MG/ML IJ SOLN
20.0000 mg | Freq: Once | INTRAMUSCULAR | Status: AC
  Administered 2016-01-17: 20 mg via INTRAVENOUS
  Filled 2016-01-17: qty 2

## 2016-01-17 MED ORDER — SODIUM CHLORIDE 0.9 % IV SOLN: Freq: Once | INTRAVENOUS | Status: DC

## 2016-01-17 MED ORDER — HEPARIN SOD (PORK) LOCK FLUSH 100 UNIT/ML IV SOLN
500.0000 [IU] | Freq: Once | INTRAVENOUS | Status: DC
  Filled 2016-01-17: qty 5

## 2016-01-17 MED ORDER — PREDNISONE 20 MG PO TABS: ORAL_TABLET | ORAL | Status: DC

## 2016-01-17 MED ORDER — PANTOPRAZOLE SODIUM 40 MG PO TBEC
40.0000 mg | DELAYED_RELEASE_TABLET | Freq: Two times a day (BID) | ORAL | Status: DC
  Administered 2016-01-17: 40 mg via ORAL
  Filled 2016-01-17: qty 1

## 2016-01-17 NOTE — Care Management Important Message (Signed)
Important Message  Patient Details  Name: [name] MRN: [id number] Date of Birth: 1951/04/04   Medicare Important Message Given:  Yes    Erenest Rasher, RN 01/17/2016, 1:43 PM

## 2016-01-17 NOTE — Discharge Summary (Signed)
Physician Discharge Summary  [name] TIW:[id number] DOB: Jun 24, 1951 DOA: 01/14/2016  PCP: [name], MD  Admit date: 01/14/2016 Discharge date: 01/17/2016  Time spent: 35 minutes  Recommendations for Outpatient Follow-up:  Needs referral to GI for evaluation of reflux, aspiration Follow up with oncology for further therapy of lymphoma  Discharge Diagnoses:    Severe sepsis secondary to pneumonia   Essential hypertension   Other dysphagia   Recurrent pneumonia   Hypothyroidism   Chronic respiratory failure with hypoxia (HCC)   NHL (non-Hodgkin's lymphoma) ([place])   Chronic obstructive pulmonary disease ([place])   Sepsis ([place])   AKI (acute kidney injury) ([place])   Severe sepsis ([place])   Discharge Condition: stable  Diet recommendation: heart healthy  Filed Weights   01/14/16 0930 01/14/16 1700 01/16/16 1055  Weight: 99.791 kg (220 lb) 100.5 kg (221 lb 9 oz) 99.791 kg (220 lb)    History of present illness:  [name] is a 65 y.o. male with a medical history of non-Hodgkin's lymphoma, depression, hypertension, presented to the emergency department with complaints of fever and cough. Patient had presented to the [facility] for his last infusion of chemotherapy for non-Hodgkin's lymphoma the day of admission and , he was found to be hypotensive with fever and hypoxia, and sent to emergency department. Chest x ray consistent with PNA.  Hospital Course:  Severe sepsis secondary to pneumonia There were concern with aspiration PNA.  Continue with Vancomycin and Zosyn, Azithro to cover atypical.  Swallow evaluation dysphagia , might be related to esophageal problems. Needs to follow up with GI Vitals stable transfer to med-surgery  He will be discharge on Augmentin for 5 more days   Acute kidney injury -Upon admission, creatinine 1.5 -Based and creatinine appears to be 0.9-1 -Likely secondary to sepsis.  -resolved with IV fluids. Encourage oral intake for  mildly elevated sodium.    Non-Hodgkin's lymphoma -Patient was supposed to receive his last dose of chemotherapy today. He follows with Dr. Earlie Server. He received Neulasta approximately 3 weeks ago. -Dr. Earlie Server aware of patient's admission. -on chronic prednisone per patient per Dr Julien Nordmann.   Acute on Chronic respiratory failure/COPD -Patient uses 2 L of home oxygen 24 hours daily. -Continue to monitor -Continue home medications -taper prednisone, schedule nebulizer.   Anemia; related to chemo. Hb decrease to 7, plan to transfuse 2 units and discharge home after   Possible adrenal insufficiency -taper hydrocortisone. Transition to prednisone.   Gout -Continue allopurinol  Hypertension -Hold amlodipine, and lisinopril -Patient was hypotensive upon admission  BPH -Continue Proscar  Hypothyroidism  -Continue Synthroid  GERD -Continue PPI  Depression -Continue Lexapro  Procedures:  none  Consultations:  none  Discharge Exam: Filed Vitals:   01/16/16 2126 01/17/16 0538  BP: 154/76 152/78  Pulse: 88 79  Temp: 98 F (36.7 C) 98.3 F (36.8 C)  Resp: 22 20    General: NAD Cardiovascular: S 1, S 2 RRR Respiratory: CTA  Discharge Instructions   Discharge Instructions    Diet - low sodium heart healthy    Complete by:  As directed      Increase activity slowly    Complete by:  As directed           Current Discharge Medication List    START taking these medications   Details  guaiFENesin (MUCINEX) 600 MG 12 hr tablet Take 1 tablet (600 mg total) by mouth 2 (two) times daily. Qty: 30 tablet, Refills: 0  saccharomyces boulardii (FLORASTOR) 250 MG capsule Take 1 capsule (250 mg total) by mouth 2 (two) times daily. 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    amoxicillin-clavulanate (AUGMENTIN) 875-125 MG tablet Take 1 tablet by mouth 2 (two) times daily.    aspirin 325 MG tablet Take 325 mg by mouth daily.      escitalopram (LEXAPRO) 5 MG tablet Take 5 mg by mouth daily.    finasteride (PROSCAR) 5 MG tablet Take 5 mg by mouth daily.    levothyroxine (SYNTHROID, LEVOTHROID) 150 MCG tablet Take 150 mcg by mouth daily.   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 Qty: 30 tablet, Refills: 11    loratadine (CLARITIN) 10 MG tablet Take 10 mg by mouth daily. Reported on 11/12/2015    omeprazole (PRILOSEC) 20 MG capsule Take 1 tablet by mouth Twice daily.    oxyCODONE-acetaminophen (PERCOCET) 10-325 MG tablet Take 1 tablet by mouth every 4 (four) hours as needed for  pain.   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     prochlorperazine (COMPAZINE) 10 MG tablet Take 1 tablet (10 mg total) by mouth every 6 (six) hours as needed for nausea or vomiting. 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 Qty: 30 capsule, Refills: 6    magic mouthwash w/lidocaine SOLN Take 5 mLs by mouth 3 (three) times daily. Qty: 120 mL, Refills: 1   Associated Diagnoses: Diffuse follicle center lymphoma of lymph nodes of multiple sites ([place])       No Known Allergies Follow-up Information    Follow up with [name], MD In  1 week.   Specialty:  Family Medicine   Contact information:   [place] [address] [phone]       Follow up with Eilleen Kempf., MD In 1 week.   Specialty:  Oncology   Contact information:   [address] [place] [address] [phone]        The results of significant diagnostics from this hospitalization (including imaging, microbiology, ancillary and laboratory) are listed below for reference.    Significant Diagnostic Studies: Dg Chest 2 View  01/14/2016  CLINICAL DATA:  Shortness of breath. EXAM: CHEST  2 VIEW COMPARISON:  September 08, 2015. FINDINGS: Stable cardiomediastinal silhouette. No pneumothorax is noted. Left lung is clear. Interval placement of right internal jugular Port-A-Cath with distal tip in expected position of the SVC. New right middle lobe airspace opacity is noted concerning for  pneumonia or atelectasis. Bony thorax is unremarkable. IMPRESSION: New right middle lobe opacity concerning for pneumonia or atelectasis. Follow-up radiographs in 3-4 weeks after antibiotic therapy trial is recommended to ensure resolution and rule out underlying neoplasm or malignancy. Electronically Signed   By: Marijo Conception, M.D.   On: 01/14/2016 10:02   Dg Swallowing Func-speech Pathology  01/16/2016  Objective Swallowing Evaluation: Type of Study: MBS-Modified Barium Swallow Study Patient Details Name: COLLINS KERBY MRN: [id number] Date of Birth: Sep 10, 1951 Today's Date: 01/16/2016 Time: SLP Start Time (ACUTE ONLY): 1430-SLP Stop Time (ACUTE ONLY): 1450 SLP Time Calculation (min) (ACUTE ONLY): 20 min Past Medical History: Past Medical History Diagnosis Date . Streptococcal pneumonia [facility]) August 2009 . Congestive heart failure ([place])  . Gastrointestinal obstruction (HCC)    Ileus . Pulmonary hypertension ([place])  . Polycythemia  . Hypothyroid  . COPD (chronic obstructive pulmonary disease) ([place])  . Obesity  . Gout  . BPH (benign prostatic hyperplasia) 2011 . Depression  . Hypertension  . Lung cancer ([place])  . NHL (non-Hodgkin's lymphoma) ([place]) 09/25/2015 . Encounter for antineoplastic chemotherapy 10/08/2015 . Drug-induced neutropenia ([place]) 10/08/2015 . Sepsis ([place]) 01/14/2016 Past Surgical History: Past Surgical History Procedure Laterality Date . Appendectomy   . Back surgery   . Lung cancer surgery  2011 . Cardioversion  11/27/2011   Procedure: CARDIOVERSION;  Surgeon: Thayer Headings, MD;  Location: [facility] ENDOSCOPY;  Service: Cardiovascular;  Laterality: N/A; HPI: Miraj Truss Wacha is a 65 y.o. male with a medical history of non-Hodgkin's lymphoma, depression, hypertension, presented to the emergency department with complaints of fever and cough. Patient had presented to the [facility] for his last infusion of chemotherapy for non-Hodgkin's lymphoma the day of admission and , he was found to be hypotensive with  fever and hypoxia, and sent to emergency department. Chest x ray consistent with PNA.  No Data Recorded Assessment / Plan / Recommendation CHL IP CLINICAL IMPRESSIONS 01/16/2016 Therapy Diagnosis Suspected primary esophageal dysphagia;Mild pharyngeal phase dysphagia Clinical Impression Pt presents with mild pharyngeal based dysphagia with intermittent laryngeal penetration of thin liquids and trace SILENT aspiration of sequential boluses of thin due to decreased laryngeal elevation/closure.  Cued throat clear removed barium from larynx and cued cough largely removed aspirates mixed with secretions.  Only minimal residuals present due to weakness in pharyngeal contraction.  Pt reports he takes his medicine with applesauce - Barium tablet given with puree appeared to lodge in mid-esophagus, thin barium swallow did not aid clearance and resulted in retrograde propulsion - thin water consumption facilitated clearance - pt did NOT sense residuals nor appearance of refluxing.  Of note, pt does report reflux problems and states this occurs nearly every time before hospital admission.  He reports he ate pancakes Monday evening and refluxed over night = tasting syrup and bitter stomach acid contents.  Advised him to aspiration/reflux precautions to mitigate risk.  Provided his wife with material on heimlich maneuver and esophageal precautions.  Suspect pt's aspiration episode was due to esophageal issues/reflux based on MBS coupled with pt symptomology.   Impact on safety and function Mild aspiration risk;Other (comment)   No flowsheet data found.  Prognosis 01/16/2016 Prognosis for Safe Diet Advancement Guarded Barriers to Reach Goals -- Barriers/Prognosis Comment -- CHL IP DIET RECOMMENDATION 01/16/2016 SLP Diet Recommendations Regular solids;Thin liquid Liquid Administration via Cup;Straw Medication Administration Whole meds with puree/ start and follow with water Compensations Slow rate;Small sips/bites;Follow solids with  liquid;Clear throat intermittently Postural Changes Remain semi-upright after after feeds/meals (Comment);Seated upright at 90 degrees   CHL IP OTHER RECOMMENDATIONS 01/16/2016 Recommended Consults -- Oral Care Recommendations Oral care BID Other Recommendations --   CHL IP FOLLOW UP RECOMMENDATIONS 01/16/2016 Follow up Recommendations None   CHL IP FREQUENCY AND DURATION 01/16/2016 Speech Therapy Frequency (ACUTE ONLY) min 1 x/week Treatment Duration 1 week      CHL IP ORAL PHASE 01/16/2016 Oral Phase WFL Oral - Pudding Teaspoon -- Oral - Pudding Cup -- Oral - Honey Teaspoon -- Oral - Honey Cup -- Oral - Nectar Teaspoon -- Oral - Nectar Cup WFL Oral - Nectar Straw -- Oral - Thin Teaspoon -- Oral - Thin Cup WFL Oral - Thin Straw WFL Oral - Puree WFL Oral - Mech Soft -- Oral - Regular WFL Oral - Multi-Consistency -- Oral - Pill WFL Oral Phase - Comment --  CHL IP PHARYNGEAL PHASE 01/16/2016 Pharyngeal Phase Impaired Pharyngeal- Pudding Teaspoon -- Pharyngeal -- Pharyngeal- Pudding Cup -- Pharyngeal -- Pharyngeal- Honey Teaspoon -- Pharyngeal -- Pharyngeal- Honey Cup -- Pharyngeal -- Pharyngeal- Nectar Teaspoon -- Pharyngeal -- Pharyngeal- Nectar Cup Pharyngeal residue - valleculae;Reduced airway/laryngeal closure;Reduced laryngeal elevation Pharyngeal -- Pharyngeal- Nectar Straw -- Pharyngeal -- Pharyngeal- Thin Teaspoon -- Pharyngeal -- Pharyngeal- Thin Cup Pharyngeal residue - valleculae;Pharyngeal residue - pyriform;Reduced laryngeal elevation;Penetration/Aspiration during swallow;Reduced airway/laryngeal closure Pharyngeal Material enters airway, remains ABOVE vocal cords then ejected out Pharyngeal- Thin Straw Reduced laryngeal elevation;Reduced airway/laryngeal closure;Pharyngeal residue - valleculae;Pharyngeal residue - pyriform;Penetration/Aspiration during swallow Pharyngeal Material enters airway, passes BELOW cords without attempt by patient to eject out (silent aspiration) Pharyngeal- Puree WFL Pharyngeal --  Pharyngeal- Mechanical Soft -- Pharyngeal -- Pharyngeal- Regular WFL Pharyngeal -- Pharyngeal- Multi-consistency -- Pharyngeal -- Pharyngeal- Pill WFL Pharyngeal -- Pharyngeal Comment --  CHL IP CERVICAL ESOPHAGEAL PHASE 01/16/2016 Cervical Esophageal Phase Impaired Pudding Teaspoon -- Pudding Cup -- Honey Teaspoon -- Honey Cup -- Nectar Teaspoon -- Nectar Cup Other (Comment) Nectar Straw -- Thin Teaspoon -- Thin Cup Other (Comment) Thin Straw Other (Comment) Puree Other (Comment) Mechanical Soft -- Regular Other (Comment) Multi-consistency -- Pill Other (Comment) Cervical Esophageal Comment barium tablet given with puree appeared to lodge in mid-esophagus, thin barium swallow did not aid clearance and resulted in retrograde propulsion - thin water consumption facilitated clearance - pt did NOT sense residuals nor appearance of refluxing Luanna Salk, [place] [facility] SLP [phone]               Microbiology: Recent Results (from the past 240 hour(s))  Culture, blood (Routine x 2)     Status: None (Preliminary result)   Collection Time: 01/14/16 10:25 AM  Result  Value Ref Range Status   Specimen Description BLOOD RIGHT PORTA CATH CHEST  Final   Special Requests BOTTLES DRAWN AEROBIC AND ANAEROBIC 5ML  Final   Culture   Final    NO GROWTH 2 DAYS Performed at [facility]    Report Status PENDING  Incomplete  Culture, blood (Routine x 2)     Status: None (Preliminary result)   Collection Time: 01/14/16 10:43 AM  Result Value Ref Range Status   Specimen Description BLOOD RIGHT ANTECUBITAL  Final   Special Requests BOTTLES DRAWN AEROBIC AND ANAEROBIC 5ML  Final   Culture   Final    NO GROWTH 2 DAYS Performed at [facility]    Report Status PENDING  Incomplete  Urine culture     Status: None   Collection Time: 01/14/16  1:57 PM  Result Value Ref Range Status   Specimen Description URINE, CLEAN CATCH  Final   Special Requests NONE  Final   Culture   Final    NO GROWTH 1 DAY Performed  at [facility]    Report Status 01/15/2016 FINAL  Final  MRSA PCR Screening     Status: None   Collection Time: 01/14/16  5:00 PM  Result Value Ref Range Status   MRSA by PCR NEGATIVE NEGATIVE Final    Comment:        The GeneXpert MRSA Assay (FDA approved for NASAL specimens only), is one component of a comprehensive MRSA colonization surveillance program. It is not intended to diagnose MRSA infection nor to guide or monitor treatment for MRSA infections.   Culture, sputum-assessment     Status: None   Collection Time: 01/14/16  6:11 PM  Result Value Ref Range Status   Specimen Description SPUTUM  Final   Special Requests NONE  Final   Sputum evaluation   Final    THIS SPECIMEN IS ACCEPTABLE. RESPIRATORY CULTURE REPORT TO FOLLOW.   Report Status 01/14/2016 FINAL  Final  Culture, respiratory (NON-Expectorated)     Status: None (Preliminary result)   Collection Time: 01/14/16  6:11 PM  Result Value Ref Range Status   Specimen Description SPUTUM  Final   Special Requests NONE  Final   Gram Stain   Final    RARE WBC PRESENT, PREDOMINANTLY PMN RARE SQUAMOUS EPITHELIAL CELLS PRESENT FEW GRAM POSITIVE COCCI IN PAIRS THIS SPECIMEN IS ACCEPTABLE FOR SPUTUM CULTURE Performed at [facility]    Culture   Final    Culture reincubated for better growth Performed at [facility]    Report Status PENDING  Incomplete     Labs: Basic Metabolic Panel:  Recent Labs Lab 01/14/16 0844 01/14/16 1029 01/15/16 1100 01/16/16 0525  NA 141 141 139 146*  K 4.9 5.1 4.4 4.6  CL  --  101 105 108  CO2 33* 34* 29 33*  GLUCOSE 117 127* 220* 115*  BUN 20.1 22* 17 14  CREATININE 1.5* 1.43* 0.90 0.97  CALCIUM 9.0 8.7* 8.5* 8.9   Liver Function Tests:  Recent Labs Lab 01/14/16 0844 01/14/16 1029  AST 15 18  ALT 53 49  ALKPHOS 124 109  BILITOT 0.34 0.3  PROT 5.9* 6.1*  ALBUMIN 3.3* 3.5   No results for input(s): LIPASE, AMYLASE in the last 168 hours. No  results for input(s): AMMONIA in the last 168 hours. CBC:  Recent Labs Lab 01/14/16 0844 01/14/16 1029 01/15/16 1100 01/16/16 0525 01/17/16 0524  WBC 31.4* 34.9* 16.5* 9.7 8.4  NEUTROABS  28.4* 33.5*  --   --   --   HGB 10.0* 9.3* 8.3* 8.0* 7.7*  HCT 32.1* 30.3* 26.3* 25.7* 24.3*  MCV 93.4 94.4 96.3 96.6 95.3  PLT 265 215 160 175 182   Cardiac Enzymes: No results for input(s): CKTOTAL, CKMB, CKMBINDEX, TROPONINI in the last 168 hours. BNP: BNP (last 3 results)  Recent Labs  09/08/15 0957  BNP 45.0    ProBNP (last 3 results) No results for input(s): PROBNP in the last 8760 hours.  CBG: No results for input(s): GLUCAP in the last 168 hours.     Signed:  Elmarie Shiley MD.  [facility] 01/17/2016, 8:11 AM

## 2016-01-17 NOTE — Progress Notes (Signed)
PHARMACY CONSULT: Lovenox for VTE prophylaxis   Wt: 100kg BMI:  30 Scr:  0.97 CrCl >30 ml/hr (93 ml/min) H/H: 7.7/ 24.3 Pltc: 182  A/P:  Patient is currently appropriate for standard-dose Lovenox '40mg'$  SQ q24h Pt receiving 2 units PRBC Monitor CBC and renal function, adjust as needed  Dolly Rias RPh 01/17/2016, 2:46 PM Pager [phone]

## 2016-01-17 NOTE — Care Management Note (Signed)
Case Management Note  Patient Details  Name: [name] MRN: [id number] Date of Birth: 06-18-1951  Subjective/Objective:      Severe sepsis secondary to pneumonia, AKI              Action/Plan: Discharge Planning: AVS reviewed:  NCM spoke to pt and states he lives at home with wife, [name]. States he can afford his medications. Ambulatory and independent prior to admission.   PCP-Jose Williams MD  Expected Discharge Date:  01/18/2016            Expected Discharge Plan:  Home/Self Care  In-House Referral:  NA  Discharge planning Services  CM Consult  Post Acute Care Choice:  NA Choice offered to:  NA  DME Arranged:  N/A DME Agency:  NA  HH Arranged:  NA HH Agency:  NA  Status of Service:  Completed, signed off  Medicare Important Message Given:  Yes Date Medicare IM Given:    Medicare IM give by:    Date Additional Medicare IM Given:    Additional Medicare Important Message give by:     If discussed at [place] of Stay Meetings, dates discussed:    Additional Comments:  Erenest Rasher, RN 01/17/2016, 1:44 PM

## 2016-01-19 LAB — CULTURE, BLOOD (ROUTINE X 2)
CULTURE: NO GROWTH
Culture: NO GROWTH

## 2016-01-20 LAB — TYPE AND SCREEN
ABO/RH(D): A POS
ANTIBODY SCREEN: NEGATIVE
UNIT DIVISION: 0
Unit division: 0

## 2016-01-24 ENCOUNTER — Telehealth: Payer: Self-pay | Admitting: *Deleted

## 2016-01-24 ENCOUNTER — Telehealth: Payer: Self-pay | Admitting: Internal Medicine

## 2016-01-24 ENCOUNTER — Other Ambulatory Visit: Payer: Self-pay | Admitting: *Deleted

## 2016-01-24 DIAGNOSIS — J449 Chronic obstructive pulmonary disease, unspecified: Secondary | ICD-10-CM | POA: Diagnosis not present

## 2016-01-24 NOTE — Telephone Encounter (Signed)
per pof to sch pt appt-sent MW email to sch trmt-will call pt once reply °

## 2016-01-24 NOTE — Telephone Encounter (Signed)
Per staff message and POF I have scheduled appts. Advised scheduler of appts. JMW  

## 2016-01-24 NOTE — Progress Notes (Signed)
Pt in lobby requesting a f.u with MD. Recent d/c from hospital on 4/28. POF sent

## 2016-01-29 ENCOUNTER — Ambulatory Visit (HOSPITAL_BASED_OUTPATIENT_CLINIC_OR_DEPARTMENT_OTHER): Payer: PPO

## 2016-01-29 ENCOUNTER — Telehealth: Payer: Self-pay | Admitting: Internal Medicine

## 2016-01-29 ENCOUNTER — Other Ambulatory Visit (HOSPITAL_BASED_OUTPATIENT_CLINIC_OR_DEPARTMENT_OTHER): Payer: PPO

## 2016-01-29 ENCOUNTER — Ambulatory Visit (HOSPITAL_BASED_OUTPATIENT_CLINIC_OR_DEPARTMENT_OTHER): Payer: PPO | Admitting: Internal Medicine

## 2016-01-29 ENCOUNTER — Encounter: Payer: Self-pay | Admitting: Internal Medicine

## 2016-01-29 VITALS — BP 142/71 | HR 87 | Temp 98.1°F | Resp 20

## 2016-01-29 VITALS — BP 118/75 | HR 82 | Temp 98.1°F | Resp 18 | Ht 72.0 in | Wt 224.8 lb

## 2016-01-29 DIAGNOSIS — C8338 Diffuse large B-cell lymphoma, lymph nodes of multiple sites: Secondary | ICD-10-CM

## 2016-01-29 DIAGNOSIS — R05 Cough: Secondary | ICD-10-CM | POA: Diagnosis not present

## 2016-01-29 DIAGNOSIS — Z5111 Encounter for antineoplastic chemotherapy: Secondary | ICD-10-CM

## 2016-01-29 DIAGNOSIS — C8258 Diffuse follicle center lymphoma, lymph nodes of multiple sites: Secondary | ICD-10-CM

## 2016-01-29 DIAGNOSIS — Z5112 Encounter for antineoplastic immunotherapy: Secondary | ICD-10-CM

## 2016-01-29 DIAGNOSIS — R0602 Shortness of breath: Secondary | ICD-10-CM | POA: Diagnosis not present

## 2016-01-29 DIAGNOSIS — J449 Chronic obstructive pulmonary disease, unspecified: Secondary | ICD-10-CM | POA: Diagnosis not present

## 2016-01-29 LAB — COMPREHENSIVE METABOLIC PANEL
ALBUMIN: 3.7 g/dL (ref 3.5–5.0)
ALK PHOS: 61 U/L (ref 40–150)
ALT: 11 U/L (ref 0–55)
ANION GAP: 7 meq/L (ref 3–11)
AST: 9 U/L (ref 5–34)
BUN: 22.3 mg/dL (ref 7.0–26.0)
CHLORIDE: 103 meq/L (ref 98–109)
CO2: 33 mEq/L — ABNORMAL HIGH (ref 22–29)
Calcium: 9.1 mg/dL (ref 8.4–10.4)
Creatinine: 1.1 mg/dL (ref 0.7–1.3)
EGFR: 70 mL/min/{1.73_m2} — ABNORMAL LOW (ref >90)
Glucose: 86 mg/dl (ref 70–140)
POTASSIUM: 5.1 meq/L (ref 3.5–5.1)
Sodium: 144 mEq/L (ref 136–145)
Total Bilirubin: 0.37 mg/dL (ref 0.20–1.20)
Total Protein: 6.2 g/dL — ABNORMAL LOW (ref 6.4–8.3)

## 2016-01-29 LAB — CBC WITH DIFFERENTIAL/PLATELET
BASO%: 0.4 % (ref 0.0–2.0)
BASOS ABS: 0 10*3/uL (ref 0.0–0.1)
EOS ABS: 0.4 10*3/uL (ref 0.0–0.5)
EOS%: 3.9 % (ref 0.0–7.0)
HCT: 36.7 % — ABNORMAL LOW (ref 38.4–49.9)
HGB: 11.3 g/dL — ABNORMAL LOW (ref 13.0–17.1)
LYMPH%: 17.2 % (ref 14.0–49.0)
MCH: 29.9 pg (ref 27.2–33.4)
MCHC: 30.8 g/dL — AB (ref 32.0–36.0)
MCV: 97.1 fL (ref 79.3–98.0)
MONO#: 0.9 10*3/uL (ref 0.1–0.9)
MONO%: 8.7 % (ref 0.0–14.0)
NEUT#: 6.9 10*3/uL — ABNORMAL HIGH (ref 1.5–6.5)
NEUT%: 69.8 % (ref 39.0–75.0)
PLATELETS: 206 10*3/uL (ref 140–400)
RBC: 3.78 10*6/uL — ABNORMAL LOW (ref 4.20–5.82)
RDW: 16 % — ABNORMAL HIGH (ref 11.0–14.6)
WBC: 9.8 10*3/uL (ref 4.0–10.3)
lymph#: 1.7 10*3/uL (ref 0.9–3.3)

## 2016-01-29 MED ORDER — SODIUM CHLORIDE 0.9 % IJ SOLN
10.0000 mL | INTRAMUSCULAR | Status: DC | PRN
  Administered 2016-01-29: 10 mL
  Filled 2016-01-29: qty 10

## 2016-01-29 MED ORDER — SODIUM CHLORIDE 0.9 % IV SOLN
Freq: Once | INTRAVENOUS | Status: AC
  Administered 2016-01-29: 11:00:00 via INTRAVENOUS

## 2016-01-29 MED ORDER — DIPHENHYDRAMINE HCL 25 MG PO CAPS
ORAL_CAPSULE | ORAL | Status: AC
  Filled 2016-01-29: qty 2

## 2016-01-29 MED ORDER — VINCRISTINE SULFATE CHEMO INJECTION 1 MG/ML
2.0000 mg | Freq: Once | INTRAVENOUS | Status: AC
  Administered 2016-01-29: 2 mg via INTRAVENOUS
  Filled 2016-01-29: qty 2

## 2016-01-29 MED ORDER — SODIUM CHLORIDE 0.9 % IV SOLN
Freq: Once | INTRAVENOUS | Status: AC
  Administered 2016-01-29: 12:00:00 via INTRAVENOUS
  Filled 2016-01-29: qty 8

## 2016-01-29 MED ORDER — ACETAMINOPHEN 325 MG PO TABS
ORAL_TABLET | ORAL | Status: AC
  Filled 2016-01-29: qty 2

## 2016-01-29 MED ORDER — ACETAMINOPHEN 325 MG PO TABS
650.0000 mg | ORAL_TABLET | Freq: Once | ORAL | Status: AC
  Administered 2016-01-29: 650 mg via ORAL

## 2016-01-29 MED ORDER — DIPHENHYDRAMINE HCL 25 MG PO CAPS
50.0000 mg | ORAL_CAPSULE | Freq: Once | ORAL | Status: AC
  Administered 2016-01-29: 50 mg via ORAL

## 2016-01-29 MED ORDER — HEPARIN SOD (PORK) LOCK FLUSH 100 UNIT/ML IV SOLN
500.0000 [IU] | Freq: Once | INTRAVENOUS | Status: AC | PRN
  Administered 2016-01-29: 500 [IU]
  Filled 2016-01-29: qty 5

## 2016-01-29 MED ORDER — DOXORUBICIN HCL CHEMO IV INJECTION 2 MG/ML
50.0000 mg/m2 | Freq: Once | INTRAVENOUS | Status: AC
  Administered 2016-01-29: 112 mg via INTRAVENOUS
  Filled 2016-01-29: qty 56

## 2016-01-29 MED ORDER — SODIUM CHLORIDE 0.9 % IV SOLN
750.0000 mg/m2 | Freq: Once | INTRAVENOUS | Status: AC
  Administered 2016-01-29: 1680 mg via INTRAVENOUS
  Filled 2016-01-29: qty 84

## 2016-01-29 MED ORDER — SODIUM CHLORIDE 0.9 % IV SOLN
375.0000 mg/m2 | Freq: Once | INTRAVENOUS | Status: AC
  Administered 2016-01-29: 900 mg via INTRAVENOUS
  Filled 2016-01-29: qty 80

## 2016-01-29 NOTE — Progress Notes (Signed)
[place] [phone]   Fax:([phone] [place], [place] [address]  DIAGNOSIS: Stage III large B-cell non-Hodgkin lymphoma presenting with extensive lymphadenopathy involving the neck, chest, abdomen as well as splenomegaly diagnosed in December 2016.  PRIOR THERAPY:None  CURRENT THERAPY:Systemic chemotherapy with the CHOP/Rituxan every 3 weeks with Neulasta support. Status post 5 cycles.  INTERVAL HISTORY: [name] 65 y.o. male returns to the clinic today for follow-up visit accompanied by his wife. The patient is feeling much better today. He was admitted to [facility] with pneumonia and sepsis few weeks ago. He recovered well and has no significant complaints today except for the baseline shortness breath and he still on home oxygen secondary to COPD. He denied having any current fever or chills. He has no nausea or vomiting. The patient denied having any significant chest pain but continues to have mild cough with no hemoptysis. He is here today to start cycle #6 of his systemic chemotherapy.  MEDICAL HISTORY: Past Medical History  Diagnosis Date  . Streptococcal pneumonia [facility]) August 2009  . Congestive heart failure ([place])   . Gastrointestinal obstruction (HCC)     Ileus  . Pulmonary hypertension ([place])   . Polycythemia   . Hypothyroid   . COPD (chronic obstructive pulmonary disease) ([place])   . Obesity   . Gout   . BPH (benign prostatic hyperplasia) 2011  . Depression   . Hypertension   . Lung cancer ([place])   . NHL (non-Hodgkin's lymphoma) ([place]) 09/25/2015  . Encounter for antineoplastic chemotherapy 10/08/2015  . Drug-induced neutropenia ([place]) 10/08/2015  . Sepsis ([place]) 01/14/2016    ALLERGIES:  has No Known Allergies.  MEDICATIONS:  Current Outpatient Prescriptions  Medication Sig Dispense Refill  . albuterol (PROVENTIL) (2.5 MG/3ML) 0.083% nebulizer solution  INHALE 3 MILLILITERS (2.5 MG) BY NEBULIZATION ROUTE EVERY 8 HOURS AS NEEDED for shortness of breath  3  . allopurinol (ZYLOPRIM) 100 MG tablet Take 1 tablet (100 mg total) by mouth 2 (two) times daily. (Patient taking differently: Take 100 mg by mouth 2 (two) times daily as needed (pain). ) 60 tablet 4  . amLODipine (NORVASC) 5 MG tablet Take 1 tablet by mouth Daily.    [name] amoxicillin-clavulanate (AUGMENTIN) 875-125 MG tablet Take 1 tablet by mouth 2 (two) times daily.    [name] aspirin 325 MG tablet Take 325 mg by mouth daily.      [name] escitalopram (LEXAPRO) 5 MG tablet Take 5 mg by mouth daily.    . finasteride (PROSCAR) 5 MG tablet Take 5 mg by mouth daily.    [name] guaiFENesin (MUCINEX) 600 MG 12 hr tablet Take 1 tablet (600 mg total) by mouth 2 (two) times daily. 30 tablet 0  . levothyroxine (SYNTHROID, LEVOTHROID) 150 MCG tablet Take 150 mcg by mouth daily.    [name] lidocaine-prilocaine (EMLA) cream Apply 1 application topically as needed. 30 g 0  . lisinopril (PRINIVIL,ZESTRIL) 10 MG tablet Take 1 tablet (10 mg total) by mouth daily. 30 tablet 11  . loratadine (CLARITIN) 10 MG tablet Take 10 mg by mouth daily. Reported on 11/12/2015    . magic mouthwash w/lidocaine SOLN Take 5 mLs by mouth 3 (three) times daily. (Patient not taking: Reported on 01/14/2016) 120 mL 1  . omeprazole (PRILOSEC) 20 MG capsule Take 1 tablet by mouth Twice daily.    [name] oxyCODONE-acetaminophen (PERCOCET) 10-325 MG tablet Take 1  tablet by mouth every 4 (four) hours as needed for pain.    . predniSONE (DELTASONE) 20 MG tablet Take 2 tablets for 3 days then go back to prior dose of 20 mg daily 30 tablet 0  . PROAIR HFA 108 (90 Base) MCG/ACT inhaler Take 2 puffs by mouth every 6 (six) hours as needed for wheezing or shortness of breath.     . prochlorperazine (COMPAZINE) 10 MG tablet Take 1 tablet (10 mg total) by mouth every 6 (six) hours as needed for nausea or vomiting. 60 tablet 0  . saccharomyces boulardii (FLORASTOR) 250 MG capsule  Take 1 capsule (250 mg total) by mouth 2 (two) times daily. 30 capsule 0  . tiotropium (SPIRIVA) 18 MCG inhalation capsule Place 1 capsule (18 mcg total) into inhaler and inhale daily. 30 capsule 6   No current facility-administered medications for this visit.    SURGICAL HISTORY:  Past Surgical History  Procedure Laterality Date  . Appendectomy    . Back surgery    . Lung cancer surgery  2011  . Cardioversion  11/27/2011    Procedure: CARDIOVERSION;  Surgeon: Thayer Headings, MD;  Location: [facility] ENDOSCOPY;  Service: Cardiovascular;  Laterality: N/A;    REVIEW OF SYSTEMS:  A comprehensive review of systems was negative except for: Respiratory: positive for cough and dyspnea on exertion   PHYSICAL EXAMINATION: General appearance: alert, cooperative and no distress Head: Normocephalic, without obvious abnormality, atraumatic Neck: no adenopathy, no JVD, supple, symmetrical, trachea midline and thyroid not enlarged, symmetric, no tenderness/mass/nodules Lymph nodes: Cervical, supraclavicular, and axillary nodes normal. Resp: wheezes bilaterally Back: symmetric, no curvature. ROM normal. No CVA tenderness. Cardio: regular rate and rhythm, S1, S2 normal, no murmur, click, rub or gallop GI: soft, non-tender; bowel sounds normal; no masses,  no organomegaly Extremities: extremities normal, atraumatic, no cyanosis or edema Neurologic: Alert and oriented X 3, normal strength and tone. Normal symmetric reflexes. Normal coordination and gait  ECOG PERFORMANCE STATUS: 1 - Symptomatic but completely ambulatory  Blood pressure 118/75, pulse 82, temperature 98.1 F (36.7 C), temperature source Oral, resp. rate 18, height 6' (1.829 m), weight 224 lb 12.8 oz (101.969 kg), SpO2 99 %.  LABORATORY DATA: Lab Results  Component Value Date   WBC 9.8 01/29/2016   HGB 11.3* 01/29/2016   HCT 36.7* 01/29/2016   MCV 97.1 01/29/2016   PLT 206 01/29/2016      Chemistry      Component Value Date/Time    NA 146* 01/16/2016 0525   NA 141 01/14/2016 0844   K 4.6 01/16/2016 0525   K 4.9 01/14/2016 0844   CL 108 01/16/2016 0525   CO2 33* 01/16/2016 0525   CO2 33* 01/14/2016 0844   BUN 14 01/16/2016 0525   BUN 20.1 01/14/2016 0844   CREATININE 0.97 01/16/2016 0525   CREATININE 1.5* 01/14/2016 0844      Component Value Date/Time   CALCIUM 8.9 01/16/2016 0525   CALCIUM 9.0 01/14/2016 0844   ALKPHOS 109 01/14/2016 1029   ALKPHOS 124 01/14/2016 0844   AST 18 01/14/2016 1029   AST 15 01/14/2016 0844   ALT 49 01/14/2016 1029   ALT 53 01/14/2016 0844   BILITOT 0.3 01/14/2016 1029   BILITOT 0.34 01/14/2016 0844       RADIOGRAPHIC STUDIES: Dg Chest 2 View  01/14/2016  CLINICAL DATA:  Shortness of breath. EXAM: CHEST  2 VIEW COMPARISON:  September 08, 2015. FINDINGS: Stable cardiomediastinal silhouette. No pneumothorax is noted. Left lung  is clear. Interval placement of right internal jugular Port-A-Cath with distal tip in expected position of the SVC. New right middle lobe airspace opacity is noted concerning for pneumonia or atelectasis. Bony thorax is unremarkable. IMPRESSION: New right middle lobe opacity concerning for pneumonia or atelectasis. Follow-up radiographs in 3-4 weeks after antibiotic therapy trial is recommended to ensure resolution and rule out underlying neoplasm or malignancy. Electronically Signed   By: Marijo Conception, M.D.   On: 01/14/2016 10:02   Dg Swallowing Func-speech Pathology  01/16/2016  Objective Swallowing Evaluation: Type of Study: MBS-Modified Barium Swallow Study Patient Details Name: TYREASE VANDEBERG MRN: [id number] Date of Birth: 1950/10/26 Today's Date: 01/16/2016 Time: SLP Start Time (ACUTE ONLY): 1430-SLP Stop Time (ACUTE ONLY): 1450 SLP Time Calculation (min) (ACUTE ONLY): 20 min Past Medical History: Past Medical History Diagnosis Date . Streptococcal pneumonia [facility]) August 2009 . Congestive heart failure ([place])  . Gastrointestinal obstruction (HCC)    Ileus .  Pulmonary hypertension ([place])  . Polycythemia  . Hypothyroid  . COPD (chronic obstructive pulmonary disease) ([place])  . Obesity  . Gout  . BPH (benign prostatic hyperplasia) 2011 . Depression  . Hypertension  . Lung cancer ([place])  . NHL (non-Hodgkin's lymphoma) ([place]) 09/25/2015 . Encounter for antineoplastic chemotherapy 10/08/2015 . Drug-induced neutropenia ([place]) 10/08/2015 . Sepsis ([place]) 01/14/2016 Past Surgical History: Past Surgical History Procedure Laterality Date . Appendectomy   . Back surgery   . Lung cancer surgery  2011 . Cardioversion  11/27/2011   Procedure: CARDIOVERSION;  Surgeon: Thayer Headings, MD;  Location: [facility] ENDOSCOPY;  Service: Cardiovascular;  Laterality: N/A; HPI: Dequon Schnebly [place] is a 65 y.o. male with a medical history of non-Hodgkin's lymphoma, depression, hypertension, presented to the emergency department with complaints of fever and cough. Patient had presented to the [facility] for his last infusion of chemotherapy for non-Hodgkin's lymphoma the day of admission and , he was found to be hypotensive with fever and hypoxia, and sent to emergency department. Chest x ray consistent with PNA.  No Data Recorded Assessment / Plan / Recommendation CHL IP CLINICAL IMPRESSIONS 01/16/2016 Therapy Diagnosis Suspected primary esophageal dysphagia;Mild pharyngeal phase dysphagia Clinical Impression Pt presents with mild pharyngeal based dysphagia with intermittent laryngeal penetration of thin liquids and trace SILENT aspiration of sequential boluses of thin due to decreased laryngeal elevation/closure.  Cued throat clear removed barium from larynx and cued cough largely removed aspirates mixed with secretions.  Only minimal residuals present due to weakness in pharyngeal contraction.  Pt reports he takes his medicine with applesauce - Barium tablet given with puree appeared to lodge in mid-esophagus, thin barium swallow did not aid clearance and resulted in retrograde propulsion - thin water consumption  facilitated clearance - pt did NOT sense residuals nor appearance of refluxing.  Of note, pt does report reflux problems and states this occurs nearly every time before hospital admission.  He reports he ate pancakes Monday evening and refluxed over night = tasting syrup and bitter stomach acid contents.  Advised him to aspiration/reflux precautions to mitigate risk.  Provided his wife with material on heimlich maneuver and esophageal precautions.  Suspect pt's aspiration episode was due to esophageal issues/reflux based on MBS coupled with pt symptomology.   Impact on safety and function Mild aspiration risk;Other (comment)   No flowsheet data found.  Prognosis 01/16/2016 Prognosis for Safe Diet Advancement Guarded Barriers to Reach Goals -- Barriers/Prognosis Comment -- CHL IP DIET RECOMMENDATION 01/16/2016 SLP Diet Recommendations Regular solids;Thin liquid Liquid  Administration via Cup;Straw Medication Administration Whole meds with puree/ start and follow with water Compensations Slow rate;Small sips/bites;Follow solids with liquid;Clear throat intermittently Postural Changes Remain semi-upright after after feeds/meals (Comment);Seated upright at 90 degrees   CHL IP OTHER RECOMMENDATIONS 01/16/2016 Recommended Consults -- Oral Care Recommendations Oral care BID Other Recommendations --   CHL IP FOLLOW UP RECOMMENDATIONS 01/16/2016 Follow up Recommendations None   CHL IP FREQUENCY AND DURATION 01/16/2016 Speech Therapy Frequency (ACUTE ONLY) min 1 x/week Treatment Duration 1 week      CHL IP ORAL PHASE 01/16/2016 Oral Phase WFL Oral - Pudding Teaspoon -- Oral - Pudding Cup -- Oral - Honey Teaspoon -- Oral - Honey Cup -- Oral - Nectar Teaspoon -- Oral - Nectar Cup WFL Oral - Nectar Straw -- Oral - Thin Teaspoon -- Oral - Thin Cup WFL Oral - Thin Straw WFL Oral - Puree WFL Oral - Mech Soft -- Oral - Regular WFL Oral - Multi-Consistency -- Oral - Pill WFL Oral Phase - Comment --  CHL IP PHARYNGEAL PHASE 01/16/2016  Pharyngeal Phase Impaired Pharyngeal- Pudding Teaspoon -- Pharyngeal -- Pharyngeal- Pudding Cup -- Pharyngeal -- Pharyngeal- Honey Teaspoon -- Pharyngeal -- Pharyngeal- Honey Cup -- Pharyngeal -- Pharyngeal- Nectar Teaspoon -- Pharyngeal -- Pharyngeal- Nectar Cup Pharyngeal residue - valleculae;Reduced airway/laryngeal closure;Reduced laryngeal elevation Pharyngeal -- Pharyngeal- Nectar Straw -- Pharyngeal -- Pharyngeal- Thin Teaspoon -- Pharyngeal -- Pharyngeal- Thin Cup Pharyngeal residue - valleculae;Pharyngeal residue - pyriform;Reduced laryngeal elevation;Penetration/Aspiration during swallow;Reduced airway/laryngeal closure Pharyngeal Material enters airway, remains ABOVE vocal cords then ejected out Pharyngeal- Thin Straw Reduced laryngeal elevation;Reduced airway/laryngeal closure;Pharyngeal residue - valleculae;Pharyngeal residue - pyriform;Penetration/Aspiration during swallow Pharyngeal Material enters airway, passes BELOW cords without attempt by patient to eject out (silent aspiration) Pharyngeal- Puree WFL Pharyngeal -- Pharyngeal- Mechanical Soft -- Pharyngeal -- Pharyngeal- Regular WFL Pharyngeal -- Pharyngeal- Multi-consistency -- Pharyngeal -- Pharyngeal- Pill WFL Pharyngeal -- Pharyngeal Comment --  CHL IP CERVICAL ESOPHAGEAL PHASE 01/16/2016 Cervical Esophageal Phase Impaired Pudding Teaspoon -- Pudding Cup -- Honey Teaspoon -- Honey Cup -- Nectar Teaspoon -- Nectar Cup Other (Comment) Nectar Straw -- Thin Teaspoon -- Thin Cup Other (Comment) Thin Straw Other (Comment) Puree Other (Comment) Mechanical Soft -- Regular Other (Comment) Multi-consistency -- Pill Other (Comment) Cervical Esophageal Comment barium tablet given with puree appeared to lodge in mid-esophagus, thin barium swallow did not aid clearance and resulted in retrograde propulsion - thin water consumption facilitated clearance - pt did NOT sense residuals nor appearance of refluxing Luanna Salk, MS [facility] SLP [phone]                ASSESSMENT AND PLAN: This is a very pleasant 65 years old white male diagnosed with a stage III large B-cell non-Hodgkin lymphoma and currently undergoing systemic chemotherapy with CHOP/Rituxan status post 5 cycles. He is tolerating his treatment fairly well with no significant adverse effect. I recommended for the patient to proceed with cycle #6 today as scheduled. He would come back for follow-up visit in one month for evaluation after repeating PET scan for restaging of his disease. Depending on the final PET scan results, the patient may continue on observation or received 1-2 extra doses of chemotherapy. For COPD, he is followed by Dr. Chase Caller.  The patient was advised to call immediately if he has any concerning symptoms in the interval. The patient voices understanding of current disease status and treatment options and is in agreement with the current care plan.  All questions were answered. The patient knows  to call the clinic with any problems, questions or concerns. We can certainly see the patient much sooner if necessary.  Disclaimer: This note was dictated with voice recognition software. Similar sounding words can inadvertently be transcribed and may not be corrected upon review.

## 2016-01-29 NOTE — Telephone Encounter (Signed)
s.w. pt and advised on added appts....pt ok and aware

## 2016-01-29 NOTE — Patient Instructions (Signed)
[facility] Discharge Instructions for Patients Receiving Chemotherapy  Today you received the following chemotherapy agents rituxan/adriamycin/vincristine/cytoxan.   To help prevent nausea and vomiting after your treatment, we encourage you to take your nausea medication as directed.    If you develop nausea and vomiting that is not controlled by your nausea medication, call the clinic.   BELOW ARE SYMPTOMS THAT SHOULD BE REPORTED IMMEDIATELY:  *FEVER GREATER THAN 100.5 F  *CHILLS WITH OR WITHOUT FEVER  NAUSEA AND VOMITING THAT IS NOT CONTROLLED WITH YOUR NAUSEA MEDICATION  *UNUSUAL SHORTNESS OF BREATH  *UNUSUAL BRUISING OR BLEEDING  TENDERNESS IN MOUTH AND THROAT WITH OR WITHOUT PRESENCE OF ULCERS  *URINARY PROBLEMS  *BOWEL PROBLEMS  UNUSUAL RASH Items with * indicate a potential emergency and should be followed up as soon as possible.  Feel free to call the clinic you have any questions or concerns. The clinic phone number is [phone].  

## 2016-01-31 ENCOUNTER — Ambulatory Visit (HOSPITAL_BASED_OUTPATIENT_CLINIC_OR_DEPARTMENT_OTHER): Payer: PPO

## 2016-01-31 VITALS — BP 158/88 | HR 75 | Temp 98.1°F | Resp 20

## 2016-01-31 DIAGNOSIS — Z5189 Encounter for other specified aftercare: Secondary | ICD-10-CM

## 2016-01-31 DIAGNOSIS — C8258 Diffuse follicle center lymphoma, lymph nodes of multiple sites: Secondary | ICD-10-CM

## 2016-01-31 DIAGNOSIS — C8338 Diffuse large B-cell lymphoma, lymph nodes of multiple sites: Secondary | ICD-10-CM

## 2016-01-31 MED ORDER — PEGFILGRASTIM INJECTION 6 MG/0.6ML ~~LOC~~
6.0000 mg | PREFILLED_SYRINGE | Freq: Once | SUBCUTANEOUS | Status: AC
  Administered 2016-01-31: 6 mg via SUBCUTANEOUS
  Filled 2016-01-31: qty 0.6

## 2016-01-31 NOTE — Patient Instructions (Signed)
Pegfilgrastim injection What is this medicine? PEGFILGRASTIM (PEG fil gra stim) is a long-acting granulocyte colony-stimulating factor that stimulates the growth of neutrophils, a type of white blood cell important in the body's fight against infection. It is used to reduce the incidence of fever and infection in patients with certain types of cancer who are receiving chemotherapy that affects the bone marrow, and to increase survival after being exposed to high doses of radiation. This medicine may be used for other purposes; ask your health care provider or pharmacist if you have questions. What should I tell my health care provider before I take this medicine? They need to know if you have any of these conditions: -kidney disease -latex allergy -ongoing radiation therapy -sickle cell disease -skin reactions to acrylic adhesives (On-Body Injector only) -an unusual or allergic reaction to pegfilgrastim, filgrastim, other medicines, foods, dyes, or preservatives -pregnant or trying to get pregnant -breast-feeding How should I use this medicine? This medicine is for injection under the skin. If you get this medicine at home, you will be taught how to prepare and give the pre-filled syringe or how to use the On-body Injector. Refer to the patient Instructions for Use for detailed instructions. Use exactly as directed. Take your medicine at regular intervals. Do not take your medicine more often than directed. It is important that you put your used needles and syringes in a special sharps container. Do not put them in a trash can. If you do not have a sharps container, call your pharmacist or healthcare provider to get one. Talk to your pediatrician regarding the use of this medicine in children. While this drug may be prescribed for selected conditions, precautions do apply. Overdosage: If you think you have taken too much of this medicine contact a poison control center or emergency room at  once. NOTE: This medicine is only for you. Do not share this medicine with others. What if I miss a dose? It is important not to miss your dose. Call your doctor or health care professional if you miss your dose. If you miss a dose due to an On-body Injector failure or leakage, a new dose should be administered as soon as possible using a single prefilled syringe for manual use. What may interact with this medicine? Interactions have not been studied. Give your health care provider a list of all the medicines, herbs, non-prescription drugs, or dietary supplements you use. Also tell them if you smoke, drink alcohol, or use illegal drugs. Some items may interact with your medicine. This list may not describe all possible interactions. Give your health care provider a list of all the medicines, herbs, non-prescription drugs, or dietary supplements you use. Also tell them if you smoke, drink alcohol, or use illegal drugs. Some items may interact with your medicine. What should I watch for while using this medicine? You may need blood work done while you are taking this medicine. If you are going to need a MRI, CT scan, or other procedure, tell your doctor that you are using this medicine (On-Body Injector only). What side effects may I notice from receiving this medicine? Side effects that you should report to your doctor or health care professional as soon as possible: -allergic reactions like skin rash, itching or hives, swelling of the face, lips, or tongue -dizziness -fever -pain, redness, or irritation at site where injected -pinpoint red spots on the skin -red or dark-brown urine -shortness of breath or breathing problems -stomach or side pain, or pain   at the shoulder -swelling -tiredness -trouble passing urine or change in the amount of urine Side effects that usually do not require medical attention (report to your doctor or health care professional if they continue or are  bothersome): -bone pain -muscle pain This list may not describe all possible side effects. Call your doctor for medical advice about side effects. You may report side effects to FDA at 1-800-FDA-1088. Where should I keep my medicine? Keep out of the reach of children. Store pre-filled syringes in a refrigerator between 2 and 8 degrees C (36 and 46 degrees F). Do not freeze. Keep in carton to protect from light. Throw away this medicine if it is left out of the refrigerator for more than 48 hours. Throw away any unused medicine after the expiration date. NOTE: This sheet is a summary. It may not cover all possible information. If you have questions about this medicine, talk to your doctor, pharmacist, or health care provider.    2016, [employer]vier/[employer] Standard. (2014-09-27 14:30:14)  

## 2016-02-04 DIAGNOSIS — G894 Chronic pain syndrome: Secondary | ICD-10-CM | POA: Diagnosis not present

## 2016-02-05 ENCOUNTER — Other Ambulatory Visit (HOSPITAL_BASED_OUTPATIENT_CLINIC_OR_DEPARTMENT_OTHER): Payer: PPO

## 2016-02-05 DIAGNOSIS — C8338 Diffuse large B-cell lymphoma, lymph nodes of multiple sites: Secondary | ICD-10-CM | POA: Diagnosis not present

## 2016-02-05 LAB — COMPREHENSIVE METABOLIC PANEL
ALBUMIN: 3.2 g/dL — AB (ref 3.5–5.0)
ALT: 10 U/L (ref 0–55)
Alkaline Phosphatase: 79 U/L (ref 40–150)
Anion Gap: 3 mEq/L (ref 3–11)
BUN: 14.9 mg/dL (ref 7.0–26.0)
CALCIUM: 8.9 mg/dL (ref 8.4–10.4)
CHLORIDE: 100 meq/L (ref 98–109)
CO2: 36 meq/L — AB (ref 22–29)
CREATININE: 0.9 mg/dL (ref 0.7–1.3)
EGFR: >90 mL/min/{1.73_m2} (ref >90)
Glucose: 124 mg/dl (ref 70–140)
Potassium: 5.1 mEq/L (ref 3.5–5.1)
SODIUM: 139 meq/L (ref 136–145)
TOTAL PROTEIN: 5.4 g/dL — AB (ref 6.4–8.3)
Total Bilirubin: 0.72 mg/dL (ref 0.20–1.20)

## 2016-02-05 LAB — CBC WITH DIFFERENTIAL/PLATELET
BASO%: 0.6 % (ref 0.0–2.0)
BASOS ABS: 0 10*3/uL (ref 0.0–0.1)
EOS ABS: 0.5 10*3/uL (ref 0.0–0.5)
EOS%: 14.3 % — AB (ref 0.0–7.0)
HEMATOCRIT: 31.7 % — AB (ref 38.4–49.9)
HEMOGLOBIN: 10 g/dL — AB (ref 13.0–17.1)
LYMPH#: 0.7 10*3/uL — AB (ref 0.9–3.3)
LYMPH%: 21.4 % (ref 14.0–49.0)
MCH: 29.6 pg (ref 27.2–33.4)
MCHC: 31.6 g/dL — ABNORMAL LOW (ref 32.0–36.0)
MCV: 93.8 fL (ref 79.3–98.0)
MONO#: 0.3 10*3/uL (ref 0.1–0.9)
MONO%: 7.8 % (ref 0.0–14.0)
NEUT%: 55.9 % (ref 39.0–75.0)
NEUTROS ABS: 1.9 10*3/uL (ref 1.5–6.5)
Platelets: 52 10*3/uL — ABNORMAL LOW (ref 140–400)
RBC: 3.38 10*6/uL — ABNORMAL LOW (ref 4.20–5.82)
RDW: 15.7 % — AB (ref 11.0–14.6)
WBC: 3.3 10*3/uL — AB (ref 4.0–10.3)

## 2016-02-11 ENCOUNTER — Other Ambulatory Visit: Payer: Self-pay | Admitting: *Deleted

## 2016-02-11 DIAGNOSIS — C3490 Malignant neoplasm of unspecified part of unspecified bronchus or lung: Secondary | ICD-10-CM

## 2016-02-12 ENCOUNTER — Other Ambulatory Visit (HOSPITAL_BASED_OUTPATIENT_CLINIC_OR_DEPARTMENT_OTHER): Payer: PPO

## 2016-02-12 DIAGNOSIS — C3490 Malignant neoplasm of unspecified part of unspecified bronchus or lung: Secondary | ICD-10-CM

## 2016-02-12 DIAGNOSIS — C8338 Diffuse large B-cell lymphoma, lymph nodes of multiple sites: Secondary | ICD-10-CM | POA: Diagnosis not present

## 2016-02-12 LAB — CBC WITH DIFFERENTIAL/PLATELET
BASO%: 0.7 % (ref 0.0–2.0)
BASOS ABS: 0.1 10*3/uL (ref 0.0–0.1)
EOS%: 3.8 % (ref 0.0–7.0)
Eosinophils Absolute: 0.3 10*3/uL (ref 0.0–0.5)
HEMATOCRIT: 29.6 % — AB (ref 38.4–49.9)
HGB: 9.7 g/dL — ABNORMAL LOW (ref 13.0–17.1)
LYMPH#: 1 10*3/uL (ref 0.9–3.3)
LYMPH%: 12.2 % — AB (ref 14.0–49.0)
MCH: 30.4 pg (ref 27.2–33.4)
MCHC: 32.9 g/dL (ref 32.0–36.0)
MCV: 92.4 fL (ref 79.3–98.0)
MONO#: 0.9 10*3/uL (ref 0.1–0.9)
MONO%: 11.2 % (ref 0.0–14.0)
NEUT#: 5.7 10*3/uL (ref 1.5–6.5)
NEUT%: 72.1 % (ref 39.0–75.0)
PLATELETS: 165 10*3/uL (ref 140–400)
RBC: 3.2 10*6/uL — AB (ref 4.20–5.82)
RDW: 15.9 % — ABNORMAL HIGH (ref 11.0–14.6)
WBC: 7.9 10*3/uL (ref 4.0–10.3)

## 2016-02-12 LAB — COMPREHENSIVE METABOLIC PANEL
ALT: <9 U/L (ref 0–55)
ANION GAP: 7 meq/L (ref 3–11)
AST: 8 U/L (ref 5–34)
Albumin: 3.4 g/dL — ABNORMAL LOW (ref 3.5–5.0)
Alkaline Phosphatase: 94 U/L (ref 40–150)
BUN: 10.4 mg/dL (ref 7.0–26.0)
CALCIUM: 9 mg/dL (ref 8.4–10.4)
CHLORIDE: 101 meq/L (ref 98–109)
CO2: 34 mEq/L — ABNORMAL HIGH (ref 22–29)
Creatinine: 0.9 mg/dL (ref 0.7–1.3)
EGFR: >90 mL/min/{1.73_m2} (ref >90)
GLUCOSE: 108 mg/dL (ref 70–140)
POTASSIUM: 4.6 meq/L (ref 3.5–5.1)
Sodium: 142 mEq/L (ref 136–145)
Total Bilirubin: 0.35 mg/dL (ref 0.20–1.20)
Total Protein: 6 g/dL — ABNORMAL LOW (ref 6.4–8.3)

## 2016-02-19 ENCOUNTER — Other Ambulatory Visit (HOSPITAL_BASED_OUTPATIENT_CLINIC_OR_DEPARTMENT_OTHER): Payer: PPO

## 2016-02-19 DIAGNOSIS — C8338 Diffuse large B-cell lymphoma, lymph nodes of multiple sites: Secondary | ICD-10-CM

## 2016-02-19 LAB — CBC WITH DIFFERENTIAL/PLATELET
BASO%: 0.9 % (ref 0.0–2.0)
Basophils Absolute: 0.1 10*3/uL (ref 0.0–0.1)
EOS%: 1.3 % (ref 0.0–7.0)
Eosinophils Absolute: 0.1 10*3/uL (ref 0.0–0.5)
HEMATOCRIT: 31.2 % — AB (ref 38.4–49.9)
HEMOGLOBIN: 10.1 g/dL — AB (ref 13.0–17.1)
LYMPH#: 0.9 10*3/uL (ref 0.9–3.3)
LYMPH%: 12.4 % — ABNORMAL LOW (ref 14.0–49.0)
MCH: 30.2 pg (ref 27.2–33.4)
MCHC: 32.4 g/dL (ref 32.0–36.0)
MCV: 93.1 fL (ref 79.3–98.0)
MONO#: 0.8 10*3/uL (ref 0.1–0.9)
MONO%: 11.1 % (ref 0.0–14.0)
NEUT#: 5.3 10*3/uL (ref 1.5–6.5)
NEUT%: 74.3 % (ref 39.0–75.0)
Platelets: 314 10*3/uL (ref 140–400)
RBC: 3.35 10*6/uL — ABNORMAL LOW (ref 4.20–5.82)
RDW: 16.5 % — AB (ref 11.0–14.6)
WBC: 7.2 10*3/uL (ref 4.0–10.3)

## 2016-02-19 LAB — COMPREHENSIVE METABOLIC PANEL
AST: 8 U/L (ref 5–34)
Albumin: 3.4 g/dL — ABNORMAL LOW (ref 3.5–5.0)
Alkaline Phosphatase: 75 U/L (ref 40–150)
Anion Gap: 8 mEq/L (ref 3–11)
BUN: 14.7 mg/dL (ref 7.0–26.0)
CALCIUM: 9.2 mg/dL (ref 8.4–10.4)
CHLORIDE: 104 meq/L (ref 98–109)
CO2: 33 meq/L — AB (ref 22–29)
CREATININE: 0.9 mg/dL (ref 0.7–1.3)
EGFR: 84 mL/min/{1.73_m2} — ABNORMAL LOW (ref >90)
Glucose: 124 mg/dl (ref 70–140)
POTASSIUM: 5.6 meq/L — AB (ref 3.5–5.1)
Sodium: 145 mEq/L (ref 136–145)
Total Bilirubin: 0.3 mg/dL (ref 0.20–1.20)
Total Protein: 6.1 g/dL — ABNORMAL LOW (ref 6.4–8.3)

## 2016-02-24 ENCOUNTER — Ambulatory Visit (HOSPITAL_COMMUNITY)
Admission: RE | Admit: 2016-02-24 | Discharge: 2016-02-24 | Disposition: A | Discharge status: Home / Self Care | Payer: PPO | Source: Ambulatory Visit | Attending: Internal Medicine | Admitting: Internal Medicine

## 2016-02-24 DIAGNOSIS — Z9221 Personal history of antineoplastic chemotherapy: Secondary | ICD-10-CM | POA: Diagnosis not present

## 2016-02-24 DIAGNOSIS — J449 Chronic obstructive pulmonary disease, unspecified: Secondary | ICD-10-CM | POA: Diagnosis not present

## 2016-02-24 DIAGNOSIS — R590 Localized enlarged lymph nodes: Secondary | ICD-10-CM | POA: Insufficient documentation

## 2016-02-24 DIAGNOSIS — C8590 Non-Hodgkin lymphoma, unspecified, unspecified site: Secondary | ICD-10-CM | POA: Diagnosis not present

## 2016-02-24 DIAGNOSIS — Z5111 Encounter for antineoplastic chemotherapy: Secondary | ICD-10-CM

## 2016-02-24 DIAGNOSIS — C8258 Diffuse follicle center lymphoma, lymph nodes of multiple sites: Secondary | ICD-10-CM

## 2016-02-24 LAB — GLUCOSE, CAPILLARY: GLUCOSE-CAPILLARY: 107 mg/dL — AB (ref 65–99)

## 2016-02-24 MED ORDER — FLUDEOXYGLUCOSE F - 18 (FDG) INJECTION
11.1000 | Freq: Once | INTRAVENOUS | Status: AC | PRN
  Administered 2016-02-24: 11.1 via INTRAVENOUS

## 2016-02-27 ENCOUNTER — Encounter: Payer: Self-pay | Admitting: Internal Medicine

## 2016-02-27 ENCOUNTER — Other Ambulatory Visit (HOSPITAL_BASED_OUTPATIENT_CLINIC_OR_DEPARTMENT_OTHER): Payer: PPO

## 2016-02-27 ENCOUNTER — Ambulatory Visit (HOSPITAL_BASED_OUTPATIENT_CLINIC_OR_DEPARTMENT_OTHER): Payer: PPO | Admitting: Internal Medicine

## 2016-02-27 ENCOUNTER — Telehealth: Payer: Self-pay | Admitting: Internal Medicine

## 2016-02-27 VITALS — BP 137/60 | HR 61 | Temp 98.2°F | Resp 18 | Ht 72.0 in | Wt 224.7 lb

## 2016-02-27 DIAGNOSIS — J449 Chronic obstructive pulmonary disease, unspecified: Secondary | ICD-10-CM | POA: Diagnosis not present

## 2016-02-27 DIAGNOSIS — R0602 Shortness of breath: Secondary | ICD-10-CM

## 2016-02-27 DIAGNOSIS — C8338 Diffuse large B-cell lymphoma, lymph nodes of multiple sites: Secondary | ICD-10-CM

## 2016-02-27 DIAGNOSIS — R05 Cough: Secondary | ICD-10-CM | POA: Diagnosis not present

## 2016-02-27 DIAGNOSIS — C8258 Diffuse follicle center lymphoma, lymph nodes of multiple sites: Secondary | ICD-10-CM

## 2016-02-27 LAB — CBC WITH DIFFERENTIAL/PLATELET
BASO%: 0.8 % (ref 0.0–2.0)
BASOS ABS: 0.1 10*3/uL (ref 0.0–0.1)
EOS%: 1.6 % (ref 0.0–7.0)
Eosinophils Absolute: 0.1 10*3/uL (ref 0.0–0.5)
HEMATOCRIT: 33.9 % — AB (ref 38.4–49.9)
HGB: 10.9 g/dL — ABNORMAL LOW (ref 13.0–17.1)
LYMPH#: 1 10*3/uL (ref 0.9–3.3)
LYMPH%: 13.4 % — ABNORMAL LOW (ref 14.0–49.0)
MCH: 30.2 pg (ref 27.2–33.4)
MCHC: 32.2 g/dL (ref 32.0–36.0)
MCV: 93.5 fL (ref 79.3–98.0)
MONO#: 1 10*3/uL — AB (ref 0.1–0.9)
MONO%: 13.5 % (ref 0.0–14.0)
NEUT#: 5.4 10*3/uL (ref 1.5–6.5)
NEUT%: 70.7 % (ref 39.0–75.0)
PLATELETS: 288 10*3/uL (ref 140–400)
RBC: 3.62 10*6/uL — ABNORMAL LOW (ref 4.20–5.82)
RDW: 16.2 % — ABNORMAL HIGH (ref 11.0–14.6)
WBC: 7.7 10*3/uL (ref 4.0–10.3)

## 2016-02-27 LAB — COMPREHENSIVE METABOLIC PANEL
ALK PHOS: 72 U/L (ref 40–150)
ANION GAP: 7 meq/L (ref 3–11)
AST: 12 U/L (ref 5–34)
Albumin: 3.7 g/dL (ref 3.5–5.0)
BUN: 13.5 mg/dL (ref 7.0–26.0)
CALCIUM: 9.2 mg/dL (ref 8.4–10.4)
CO2: 33 mEq/L — ABNORMAL HIGH (ref 22–29)
Chloride: 104 mEq/L (ref 98–109)
Creatinine: 1 mg/dL (ref 0.7–1.3)
EGFR: 80 mL/min/{1.73_m2} — ABNORMAL LOW (ref >90)
Glucose: 89 mg/dl (ref 70–140)
Potassium: 4.8 mEq/L (ref 3.5–5.1)
Sodium: 143 mEq/L (ref 136–145)
Total Bilirubin: 0.39 mg/dL (ref 0.20–1.20)
Total Protein: 6.5 g/dL (ref 6.4–8.3)

## 2016-02-27 NOTE — Progress Notes (Signed)
[place] [phone]   Fax:([phone]) [place], [place] [address]  DIAGNOSIS: Stage III large B-cell non-Hodgkin lymphoma presenting with extensive lymphadenopathy involving the neck, chest, abdomen as well as splenomegaly diagnosed in December 2016.  PRIOR THERAPY: Systemic chemotherapy with the CHOP/Rituxan every 3 weeks with Neulasta support. Status post 6 cycles.  CURRENT THERAPY: Observation.  INTERVAL HISTORY: [name] 65 y.o. male returns to the clinic today for follow-up visit accompanied by his wife. The patient is feeling much better today. He tolerated the last cycle of his treatment fairly well with no significant adverse effects. He has no significant complaints today except for the baseline shortness of breath and he still on home oxygen secondary to COPD. He denied having any current fever or chills. He has no nausea or vomiting. The patient denied having any significant chest pain but continues to have mild cough with no hemoptysis. He has repeat PET scan performed recently and he is here for evaluation and discussion of his scan results.  MEDICAL HISTORY: Past Medical History  Diagnosis Date  . Streptococcal pneumonia [facility]) August 2009  . Congestive heart failure ([place])   . Gastrointestinal obstruction (HCC)     Ileus  . Pulmonary hypertension ([place])   . Polycythemia   . Hypothyroid   . COPD (chronic obstructive pulmonary disease) ([place])   . Obesity   . Gout   . BPH (benign prostatic hyperplasia) 2011  . Depression   . Hypertension   . Lung cancer ([place])   . NHL (non-Hodgkin's lymphoma) ([place]) 09/25/2015  . Encounter for antineoplastic chemotherapy 10/08/2015  . Drug-induced neutropenia ([place]) 10/08/2015  . Sepsis ([place]) 01/14/2016    ALLERGIES:  has No Known Allergies.  MEDICATIONS:  Current Outpatient Prescriptions  Medication Sig Dispense Refill  . albuterol  (PROVENTIL) (2.5 MG/3ML) 0.083% nebulizer solution INHALE 3 MILLILITERS (2.5 MG) BY NEBULIZATION ROUTE EVERY 8 HOURS AS NEEDED for shortness of breath  3  . allopurinol (ZYLOPRIM) 100 MG tablet Take 1 tablet (100 mg total) by mouth 2 (two) times daily. (Patient taking differently: Take 100 mg by mouth 2 (two) times daily as needed (pain). ) 60 tablet 4  . amLODipine (NORVASC) 5 MG tablet Take 1 tablet by mouth Daily.    [name] amoxicillin-clavulanate (AUGMENTIN) 875-125 MG tablet Take 1 tablet by mouth 2 (two) times daily.    [name] aspirin 325 MG tablet Take 325 mg by mouth daily.      [name] escitalopram (LEXAPRO) 5 MG tablet Take 5 mg by mouth daily.    . finasteride (PROSCAR) 5 MG tablet Take 5 mg by mouth daily.    [name] guaiFENesin (MUCINEX) 600 MG 12 hr tablet Take 1 tablet (600 mg total) by mouth 2 (two) times daily. 30 tablet 0  . levothyroxine (SYNTHROID, LEVOTHROID) 150 MCG tablet Take 150 mcg by mouth daily.    [name] lidocaine-prilocaine (EMLA) cream Apply 1 application topically as needed. 30 g 0  . lisinopril (PRINIVIL,ZESTRIL) 10 MG tablet Take 1 tablet (10 mg total) by mouth daily. 30 tablet 11  . loratadine (CLARITIN) 10 MG tablet Take 10 mg by mouth daily. Reported on 11/12/2015    . magic mouthwash w/lidocaine SOLN Take 5 mLs by mouth 3 (three) times daily. (Patient not taking: Reported on 01/14/2016) 120 mL 1  . omeprazole (PRILOSEC) 20 MG capsule Take 1 tablet by mouth Twice daily.    [name]  oxyCODONE-acetaminophen (PERCOCET) 10-325 MG tablet Take 1 tablet by mouth every 4 (four) hours as needed for pain.    . predniSONE (DELTASONE) 20 MG tablet Take 2 tablets for 3 days then go back to prior dose of 20 mg daily 30 tablet 0  . PROAIR HFA 108 (90 Base) MCG/ACT inhaler Take 2 puffs by mouth every 6 (six) hours as needed for wheezing or shortness of breath.     . prochlorperazine (COMPAZINE) 10 MG tablet Take 1 tablet (10 mg total) by mouth every 6 (six) hours as needed for nausea or vomiting. 60 tablet 0  .  saccharomyces boulardii (FLORASTOR) 250 MG capsule Take 1 capsule (250 mg total) by mouth 2 (two) times daily. 30 capsule 0  . tiotropium (SPIRIVA) 18 MCG inhalation capsule Place 1 capsule (18 mcg total) into inhaler and inhale daily. 30 capsule 6   No current facility-administered medications for this visit.    SURGICAL HISTORY:  Past Surgical History  Procedure Laterality Date  . Appendectomy    . Back surgery    . Lung cancer surgery  2011  . Cardioversion  11/27/2011    Procedure: CARDIOVERSION;  Surgeon: Thayer Headings, MD;  Location: [facility] ENDOSCOPY;  Service: Cardiovascular;  Laterality: N/A;    REVIEW OF SYSTEMS:  Constitutional: negative Eyes: negative Ears, nose, mouth, throat, and face: negative Respiratory: positive for dyspnea on exertion and wheezing Cardiovascular: negative Gastrointestinal: negative Genitourinary:negative Integument/breast: negative Hematologic/lymphatic: negative Musculoskeletal:negative Neurological: negative Behavioral/Psych: negative Endocrine: negative Allergic/Immunologic: negative   PHYSICAL EXAMINATION: General appearance: alert, cooperative and no distress Head: Normocephalic, without obvious abnormality, atraumatic Neck: no adenopathy, no JVD, supple, symmetrical, trachea midline and thyroid not enlarged, symmetric, no tenderness/mass/nodules Lymph nodes: Cervical, supraclavicular, and axillary nodes normal. Resp: wheezes bilaterally Back: symmetric, no curvature. ROM normal. No CVA tenderness. Cardio: regular rate and rhythm, S1, S2 normal, no murmur, click, rub or gallop GI: soft, non-tender; bowel sounds normal; no masses,  no organomegaly Extremities: extremities normal, atraumatic, no cyanosis or edema Neurologic: Alert and oriented X 3, normal strength and tone. Normal symmetric reflexes. Normal coordination and gait  ECOG PERFORMANCE STATUS: 1 - Symptomatic but completely ambulatory  There were no vitals taken for this  visit.  LABORATORY DATA: Lab Results  Component Value Date   WBC 7.7 02/27/2016   HGB 10.9* 02/27/2016   HCT 33.9* 02/27/2016   MCV 93.5 02/27/2016   PLT 288 02/27/2016      Chemistry      Component Value Date/Time   NA 145 02/19/2016 0910   NA 146* 01/16/2016 0525   K 5.6* 02/19/2016 0910   K 4.6 01/16/2016 0525   CL 108 01/16/2016 0525   CO2 33* 02/19/2016 0910   CO2 33* 01/16/2016 0525   BUN 14.7 02/19/2016 0910   BUN 14 01/16/2016 0525   CREATININE 0.9 02/19/2016 0910   CREATININE 0.97 01/16/2016 0525      Component Value Date/Time   CALCIUM 9.2 02/19/2016 0910   CALCIUM 8.9 01/16/2016 0525   ALKPHOS 75 02/19/2016 0910   ALKPHOS 109 01/14/2016 1029   AST 8 02/19/2016 0910   AST 18 01/14/2016 1029   ALT <9 02/19/2016 0910   ALT 49 01/14/2016 1029   BILITOT 0.30 02/19/2016 0910   BILITOT 0.3 01/14/2016 1029       RADIOGRAPHIC STUDIES: Nm Pet Image Initial (pi) Skull Base To Thigh  02/24/2016  CLINICAL DATA:  Subsequent treatment strategy for lymphoma. Large B-cell non-Hodgkin's lymphoma (stage III). Extensive lymphadenopathy  involving the neck, chest, abdomen as well as splenomegaly diagnosed in December 2016. Systemic chemotherapy with the CHOP / Rituxan every 3 weeks with Neulasta support. Status post 5 cycles. EXAM: NUCLEAR MEDICINE PET SKULL BASE TO THIGH TECHNIQUE: 11.1 mCi F-18 FDG was injected intravenously. Full-ring PET imaging was performed from the skull base to thigh after the radiotracer. CT data was obtained and used for attenuation correction and anatomic localization. FASTING BLOOD GLUCOSE:  Value: 107 mg/dl COMPARISON:  PET-CT 09/11/2015 FINDINGS: NECK Resolution of bilateral hypermetabolic cervical lymph nodes which extended into the LEFT and RIGHT supraclavicular nodal station. No residual hypermetabolic lymph nodes in the neck. CHEST Marked reduction bulky hypermetabolic mediastinal lymph nodes. For example 10 mm prevascular lymph node decreased from  28 mm on prior PET-CT scan with SUV max equaled 2.8 compared to 19.4. Similar decreases subcarinal lymph node activity with SUV max equal 2.3 decreased from 16.2. No new hypermetabolic lymph nodes in the mediastinum or axilla. Mild thickening along the pleural surface and fissures. No discrete metabolic activity. No pulmonary nodularity. ABDOMEN/PELVIS Marked decrease in volume and metabolic activity of bulky retroperitoneal hypermetabolic lymph nodes. For example LEFT periaortic lymph node measuring 10 mm short axis is decreased from 33 mm on prior with SUV max equal 2.6 decreased from 27. No residual hypermetabolic retroperitoneal lymph nodes are present. Likewise resolution of hypermetabolic periportal lymph nodes. The spleen is decreased in size and metabolic activity. Intense metabolic activity seen adjacent to the splenic hilum since resolved. There persists a low-density lesion in the spleen which likely represents a benign cysts Cystic lesion along the inferior aspect of the RIGHT kidney is slightly decreased in volume measuring 8.8 cm decreased in the 9.5 cm. No hypermetabolic pelvic lymph nodes. SKELETON There is mild increase in heterogeneous  marrow metal activity. IMPRESSION: 1. Findings consistent with complete response to chemotherapy with lymph node metabolic activity less than or equal mediastinal activity ( [place] 2). 2. Marked reduction in size and metabolic activity of lymph nodes in the neck, mediastinum, retroperitoneum and porta hepatis . 3. Reduction in size and resolution metabolic activity within spleen. 4. Heterogeneous marrow activity consistent with Neulasta response. Electronically Signed   By: Suzy Bouchard M.D.   On: 02/24/2016 13:45    ASSESSMENT AND PLAN: This is a very pleasant 65 years old white male diagnosed with a stage III large B-cell non-Hodgkin lymphoma and currently undergoing systemic chemotherapy with CHOP/Rituxan status post 6 cycles. He tolerated the last  cycle of his treatment well. The recent PET scan showed significant improvement of his disease was almost complete response. I discussed the scan results and showed the images to the patient and his wife. I recommended for him to continue on observation with repeat CBC, comprehensive metabolic panel and LDH in 3 months. For COPD, he is followed by Dr. Chase Caller.  The patient was advised to call immediately if he has any concerning symptoms in the interval. The patient voices understanding of current disease status and treatment options and is in agreement with the current care plan.  All questions were answered. The patient knows to call the clinic with any problems, questions or concerns. We can certainly see the patient much sooner if necessary.  Disclaimer: This note was dictated with voice recognition software. Similar sounding words can inadvertently be transcribed and may not be corrected upon review.

## 2016-02-27 NOTE — Telephone Encounter (Signed)
Gave pt apt & avs °

## 2016-03-23 ENCOUNTER — Ambulatory Visit: Payer: PPO | Admitting: Internal Medicine

## 2016-03-25 DIAGNOSIS — J449 Chronic obstructive pulmonary disease, unspecified: Secondary | ICD-10-CM | POA: Diagnosis not present

## 2016-03-31 DIAGNOSIS — F449 Dissociative and conversion disorder, unspecified: Secondary | ICD-10-CM | POA: Diagnosis not present

## 2016-03-31 DIAGNOSIS — F419 Anxiety disorder, unspecified: Secondary | ICD-10-CM | POA: Diagnosis not present

## 2016-04-25 DIAGNOSIS — J449 Chronic obstructive pulmonary disease, unspecified: Secondary | ICD-10-CM | POA: Diagnosis not present

## 2016-04-30 DIAGNOSIS — G894 Chronic pain syndrome: Secondary | ICD-10-CM | POA: Diagnosis not present

## 2016-04-30 DIAGNOSIS — C8590 Non-Hodgkin lymphoma, unspecified, unspecified site: Secondary | ICD-10-CM | POA: Diagnosis not present

## 2016-05-13 ENCOUNTER — Ambulatory Visit (INDEPENDENT_AMBULATORY_CARE_PROVIDER_SITE_OTHER): Payer: PPO | Admitting: Internal Medicine

## 2016-05-13 ENCOUNTER — Encounter: Payer: Self-pay | Admitting: Internal Medicine

## 2016-05-13 VITALS — BP 124/70 | HR 74 | Ht 72.0 in | Wt 231.0 lb

## 2016-05-13 DIAGNOSIS — J449 Chronic obstructive pulmonary disease, unspecified: Secondary | ICD-10-CM | POA: Diagnosis not present

## 2016-05-13 MED ORDER — FLUTICASONE FUROATE-VILANTEROL 100-25 MCG/INH IN AEPB: 1.0000 | INHALATION_SPRAY | Freq: Every day | RESPIRATORY_TRACT | 0 refills | Status: DC

## 2016-05-13 NOTE — Progress Notes (Signed)
Subjective:     Patient ID: [name], male   DOB: 04/22/1951, 65 y.o.   MRN: [id number]  PCP [name], MD  HPI   IOV 05/13/2016   Chief Complaint  Patient presents with  . Follow-up    Pt states he feels his SOB has improved since last OV. Pt c/o prod cough with white to light brown mucus. Pt denies CP/tightness and f/c/s.      Follow-up Gold stage III COPD. FEV1 1.66 L/45% post-dilator in March 2017 and DLCO 13.96/39%. His maintenance Spiriva and nocturnal oxygen and exertional oxygen  Last seen by nurse practitioner spring 2017. He is currently office chemotherapy for lymphoma. He is feeling improved. Improved energy levels. Shortness of breath. Mild exertional dyspnea class II at the most. Not much of a cough. He does notice desaturations after shower to 88% but does not feel it. He is wondering if he should use oxygen because even exertional oxygen using his portable system does not help him. Last imaging March 2017 that showed pleural plaques but decreasing adenopathy from his lymphoma. Walking desaturation test 185 feet 3 laps on room air: Resting pulse ox was 95%. tt 3rd lap lowest was 89% btu asymptomatic    has a past medical history of BPH (benign prostatic hyperplasia) (2011); Congestive heart failure ([place]); COPD (chronic obstructive pulmonary disease) ([place]); Depression; Drug-induced neutropenia ([place]) (10/08/2015); Encounter for antineoplastic chemotherapy (10/08/2015); Gastrointestinal obstruction ([place]); Gout; Hypertension; Hypothyroid; Lung cancer ([place]); NHL (non-Hodgkin's lymphoma) ([place]) (09/25/2015); Obesity; Polycythemia; Pulmonary hypertension ([place]); Sepsis ([place]) (01/14/2016); and Streptococcal pneumonia [facility]) (August 2009).   reports that he quit smoking about 14 years ago. His smoking use included Cigarettes. He has a 129.00 pack-year smoking history. He has never used smokeless tobacco.  Past Surgical History:  Procedure Laterality Date  . APPENDECTOMY    .  BACK SURGERY    . CARDIOVERSION  11/27/2011   Procedure: CARDIOVERSION;  Surgeon: Thayer Headings, MD;  Location: [facility];  Service: Cardiovascular;  Laterality: N/A;  . LUNG CANCER SURGERY  2011    No Known Allergies  Immunization History  Administered Date(s) Administered  . Influenza Whole 06/21/2009, 08/30/2011  . Influenza-Unspecified 09/16/2015  . Pneumococcal Polysaccharide-23 05/22/2008  . Tdap 12/03/2011    Family History  Problem Relation Age of Onset  . Heart disease Father   . Cancer Neg Hx   . Diabetes Neg Hx   . Stroke Neg Hx   . Hypertension Neg Hx   . Kidney disease Neg Hx      Current Outpatient Prescriptions:  .  albuterol (PROVENTIL) (2.5 MG/3ML) 0.083% nebulizer solution, INHALE 3 MILLILITERS (2.5 MG) BY NEBULIZATION ROUTE EVERY 8 HOURS AS NEEDED for shortness of breath, Disp: , Rfl: 3 .  allopurinol (ZYLOPRIM) 100 MG tablet, Take 1 tablet (100 mg total) by mouth 2 (two) times daily. (Patient taking differently: Take 100 mg by mouth 2 (two) times daily as needed (pain). ), Disp: 60 tablet, Rfl: 4 .  amLODipine (NORVASC) 5 MG tablet, Take 1 tablet by mouth Daily., Disp: , Rfl:  .  aspirin 325 MG tablet, Take 325 mg by mouth daily.  , Disp: , Rfl:  .  escitalopram (LEXAPRO) 5 MG tablet, Take 5 mg by mouth daily., Disp: , Rfl:  .  finasteride (PROSCAR) 5 MG tablet, Take 5 mg by mouth daily., Disp: , Rfl:  .  guaiFENesin (MUCINEX) 600 MG 12 hr tablet, Take 1 tablet (600 mg total) by mouth 2 (two) times  daily., Disp: 30 tablet, Rfl: 0 .  levothyroxine (SYNTHROID, LEVOTHROID) 150 MCG tablet, Take 150 mcg by mouth daily., Disp: , Rfl:  .  lidocaine-prilocaine (EMLA) cream, Apply 1 application topically as needed., Disp: 30 g, Rfl: 0 .  lisinopril (PRINIVIL,ZESTRIL) 10 MG tablet, Take 1 tablet (10 mg total) by mouth daily., Disp: 30 tablet, Rfl: 11 .  loratadine (CLARITIN) 10 MG tablet, Take 10 mg by mouth daily. Reported on 11/12/2015, Disp: , Rfl:  .  omeprazole  (PRILOSEC) 20 MG capsule, Take 1 tablet by mouth Twice daily., Disp: , Rfl:  .  oxyCODONE-acetaminophen (PERCOCET) 10-325 MG tablet, Take 1 tablet by mouth every 4 (four) hours as needed for pain., Disp: , Rfl:  .  PROAIR HFA 108 (90 Base) MCG/ACT inhaler, Take 2 puffs by mouth every 6 (six) hours as needed for wheezing or shortness of breath. , Disp: , Rfl:  .  tiotropium (SPIRIVA) 18 MCG inhalation capsule, Place 1 capsule (18 mcg total) into inhaler and inhale daily., Disp: 30 capsule, Rfl: 6     Review of Systems     Objective:   Physical Exam  Constitutional: He is oriented to person, place, and time. He appears well-developed and well-nourished. No distress.  HENT:  Head: Normocephalic and atraumatic.  Right Ear: External ear normal.  Left Ear: External ear normal.  Mouth/Throat: Oropharynx is clear and moist. No oropharyngeal exudate.  Eyes: Conjunctivae and EOM are normal. Pupils are equal, round, and reactive to light. Right eye exhibits no discharge. Left eye exhibits no discharge. No scleral icterus.  Neck: Normal range of motion. Neck supple. No JVD present. No tracheal deviation present. No thyromegaly present.  Cardiovascular: Normal rate, regular rhythm and intact distal pulses.  Exam reveals no gallop and no friction rub.   No murmur heard. Pulmonary/Chest: Effort normal. No respiratory distress. He has no wheezes. He has rales. He exhibits no tenderness.  Abdominal: Soft. Bowel sounds are normal. He exhibits no distension and no mass. There is no tenderness. There is no rebound and no guarding.  Visceral obesity +  Musculoskeletal: Normal range of motion. He exhibits no edema or tenderness.  Lymphadenopathy:    He has no cervical adenopathy.  Neurological: He is alert and oriented to person, place, and time. He has normal reflexes. No cranial nerve deficit. Coordination normal.  Skin: Skin is warm and dry. No rash noted. He is not diaphoretic. No erythema. No pallor.   Vitiligo skin in forearm  Psychiatric: He has a normal mood and affect. His behavior is normal. Judgment and thought content normal.  Nursing note and vitals reviewed.   Vitals:   05/13/16 0857  BP: 124/70  Pulse: 74  SpO2: 95%  Weight: 231 lb (104.8 kg)  Height: 6' (1.829 m)   Estimated body mass index is 31.33 kg/m as calculated from the following:   Height as of this encounter: 6' (1.829 m).   Weight as of this encounter: 231 lb (104.8 kg).      Assessment:       ICD-9-CM ICD-10-CM   1. COPD, severe ([place]) 496 J44.9        Plan:      Overall stable disease You can walk 600 feet without any need for oxygen  Plan - Use oxygen for exertion anything more than 600 feet - Continue oxygen at night as per previous schedule - Continue Spiriva daily - Try sample inhaler Brio low dose in addition to Spiriva and see if this makes  a difference  - If so please call us and we will add this inhaler to your regimen - Flu shot in the fall - Call us or return sooner if any flareups or any respiratory problems  Follow-up - 6 months or sooner if needed   Dr. Brand Males, M.D., [facility].C.P Pulmonary and Critical Care Medicine Staff Physician [place] [facility] Pager: [phone], If no answer or between  15:00h - 7:00h: call [phone]  05/13/2016 9:32 AM

## 2016-05-13 NOTE — Patient Instructions (Addendum)
ICD-9-CM ICD-10-CM   1. COPD, severe ([place]) 496 J44.9     Overall stable disease You can walk 600 feet without any need for oxygen  Plan - Use oxygen for exertion anything more than 600 feet - Continue oxygen at night as per previous schedule - Continue Spiriva daily - Try sample inhaler Brio low dose in addition to Spiriva and see if this makes a difference  - If so please call us and we will add this inhaler to your regimen - Flu shot in the fall - Call us or return sooner if any flareups or any respiratory problems  Follow-up - 6 months or sooner if needed

## 2016-05-26 DIAGNOSIS — J449 Chronic obstructive pulmonary disease, unspecified: Secondary | ICD-10-CM | POA: Diagnosis not present

## 2016-05-28 ENCOUNTER — Telehealth: Payer: Self-pay | Admitting: Internal Medicine

## 2016-05-28 ENCOUNTER — Other Ambulatory Visit: Payer: Self-pay | Admitting: Medical Oncology

## 2016-05-28 ENCOUNTER — Ambulatory Visit: Payer: PPO

## 2016-05-28 ENCOUNTER — Ambulatory Visit (HOSPITAL_BASED_OUTPATIENT_CLINIC_OR_DEPARTMENT_OTHER): Payer: PPO | Admitting: Internal Medicine

## 2016-05-28 ENCOUNTER — Other Ambulatory Visit (HOSPITAL_BASED_OUTPATIENT_CLINIC_OR_DEPARTMENT_OTHER): Payer: PPO

## 2016-05-28 ENCOUNTER — Encounter: Payer: Self-pay | Admitting: Internal Medicine

## 2016-05-28 VITALS — BP 118/68 | HR 65 | Temp 98.4°F | Resp 17 | Ht 72.0 in | Wt 230.5 lb

## 2016-05-28 DIAGNOSIS — C8338 Diffuse large B-cell lymphoma, lymph nodes of multiple sites: Secondary | ICD-10-CM

## 2016-05-28 DIAGNOSIS — C8258 Diffuse follicle center lymphoma, lymph nodes of multiple sites: Secondary | ICD-10-CM

## 2016-05-28 DIAGNOSIS — Z95828 Presence of other vascular implants and grafts: Secondary | ICD-10-CM

## 2016-05-28 LAB — COMPREHENSIVE METABOLIC PANEL
ALK PHOS: 82 U/L (ref 40–150)
ALT: <9 U/L (ref 0–55)
ANION GAP: 8 meq/L (ref 3–11)
AST: 9 U/L (ref 5–34)
Albumin: 3.4 g/dL — ABNORMAL LOW (ref 3.5–5.0)
BUN: 15 mg/dL (ref 7.0–26.0)
CALCIUM: 9.1 mg/dL (ref 8.4–10.4)
CHLORIDE: 104 meq/L (ref 98–109)
CO2: 31 mEq/L — ABNORMAL HIGH (ref 22–29)
Creatinine: 1 mg/dL (ref 0.7–1.3)
EGFR: 77 mL/min/{1.73_m2} — ABNORMAL LOW (ref >90)
Glucose: 102 mg/dl (ref 70–140)
POTASSIUM: 4.9 meq/L (ref 3.5–5.1)
Sodium: 143 mEq/L (ref 136–145)
Total Bilirubin: 0.46 mg/dL (ref 0.20–1.20)
Total Protein: 6.2 g/dL — ABNORMAL LOW (ref 6.4–8.3)

## 2016-05-28 LAB — CBC WITH DIFFERENTIAL/PLATELET
BASO%: 0.5 % (ref 0.0–2.0)
BASOS ABS: 0 10*3/uL (ref 0.0–0.1)
EOS ABS: 0.3 10*3/uL (ref 0.0–0.5)
EOS%: 3.7 % (ref 0.0–7.0)
HEMATOCRIT: 42 % (ref 38.4–49.9)
HGB: 13.8 g/dL (ref 13.0–17.1)
LYMPH#: 0.9 10*3/uL (ref 0.9–3.3)
LYMPH%: 10.2 % — ABNORMAL LOW (ref 14.0–49.0)
MCH: 29.1 pg (ref 27.2–33.4)
MCHC: 32.9 g/dL (ref 32.0–36.0)
MCV: 88.2 fL (ref 79.3–98.0)
MONO#: 0.5 10*3/uL (ref 0.1–0.9)
MONO%: 6.3 % (ref 0.0–14.0)
NEUT#: 6.6 10*3/uL — ABNORMAL HIGH (ref 1.5–6.5)
NEUT%: 79.3 % — ABNORMAL HIGH (ref 39.0–75.0)
PLATELETS: 173 10*3/uL (ref 140–400)
RBC: 4.76 10*6/uL (ref 4.20–5.82)
RDW: 13.6 % (ref 11.0–14.6)
WBC: 8.3 10*3/uL (ref 4.0–10.3)

## 2016-05-28 LAB — LACTATE DEHYDROGENASE: LDH: 96 U/L — AB (ref 125–245)

## 2016-05-28 MED ORDER — HEPARIN SOD (PORK) LOCK FLUSH 100 UNIT/ML IV SOLN
500.0000 [IU] | INTRAVENOUS | Status: AC | PRN
  Administered 2016-05-28: 500 [IU]
  Filled 2016-05-28: qty 5

## 2016-05-28 MED ORDER — SODIUM CHLORIDE 0.9% FLUSH
10.0000 mL | INTRAVENOUS | Status: DC | PRN
  Filled 2016-05-28: qty 10

## 2016-05-28 MED ORDER — HEPARIN SOD (PORK) LOCK FLUSH 100 UNIT/ML IV SOLN
500.0000 [IU] | Freq: Once | INTRAVENOUS | Status: DC
Start: 2016-05-28 — End: 2016-05-28
  Filled 2016-05-28: qty 5

## 2016-05-28 MED ORDER — SODIUM CHLORIDE 0.9% FLUSH
10.0000 mL | INTRAVENOUS | Status: AC | PRN
  Administered 2016-05-28: 10 mL
  Filled 2016-05-28: qty 10

## 2016-05-28 NOTE — Progress Notes (Signed)
[facility] Telephone:(336) [phone]   Fax:([phone]) [place], [place] [address]  DIAGNOSIS: Stage III large B-cell non-Hodgkin lymphoma presenting with extensive lymphadenopathy involving the neck, chest, abdomen as well as splenomegaly diagnosed in December 2016.  PRIOR THERAPY: Systemic chemotherapy with the CHOP/Rituxan every 3 weeks with Neulasta support. Status post 6 cycles.  CURRENT THERAPY: Observation.  INTERVAL HISTORY: [name] 65 y.o. male returns to the clinic today for follow-up visit. The patient is feeling very well today was no specific complaints. He saw Dr. Chase Caller yesterday and he is currently off oxygen except at night time. He denied having any current fever or chills. He denied having any significant chest pain but has shortness of breath with exertion with no cough or hemoptysis. He has no significant weight loss or night sweats. He denied having any nausea, vomiting, diarrhea or constipation. He is here today for evaluation with repeat blood work.  MEDICAL HISTORY: Past Medical History:  Diagnosis Date  . BPH (benign prostatic hyperplasia) 2011  . Congestive heart failure ([facility])   . COPD (chronic obstructive pulmonary disease) ([facility])   . Depression   . Drug-induced neutropenia ([facility]) 10/08/2015  . Encounter for antineoplastic chemotherapy 10/08/2015  . Gastrointestinal obstruction ([facility])    Ileus  . Gout   . Hypertension   . Hypothyroid   . Lung cancer ([place])   . NHL (non-Hodgkin's lymphoma) ([place]) 09/25/2015  . Obesity   . Polycythemia   . Pulmonary hypertension ([place])   . Sepsis ([place]) 01/14/2016  . Streptococcal pneumonia [facility]) August 2009    ALLERGIES:  has No Known Allergies.  MEDICATIONS:  Current Outpatient Prescriptions  Medication Sig Dispense Refill  . amLODipine (NORVASC) 5 MG tablet Take 1 tablet by mouth Daily.    [name] aspirin 325 MG tablet Take 325 mg by  mouth daily.      . finasteride (PROSCAR) 5 MG tablet Take 5 mg by mouth daily.    . fluticasone furoate-vilanterol (BREO ELLIPTA) 100-25 MCG/INH AEPB Inhale 1 puff into the lungs daily. 14 each 0  . guaiFENesin (MUCINEX) 600 MG 12 hr tablet Take 1 tablet (600 mg total) by mouth 2 (two) times daily. 30 tablet 0  . levothyroxine (SYNTHROID, LEVOTHROID) 150 MCG tablet Take 150 mcg by mouth daily.    [name] lisinopril (PRINIVIL,ZESTRIL) 10 MG tablet Take 1 tablet (10 mg total) by mouth daily. 30 tablet 11  . omeprazole (PRILOSEC) 20 MG capsule Take 1 tablet by mouth Twice daily.    [name] oxyCODONE-acetaminophen (PERCOCET) 10-325 MG tablet Take 1 tablet by mouth every 4 (four) hours as needed for pain.    [name] PROAIR HFA 108 (90 Base) MCG/ACT inhaler Take 2 puffs by mouth every 6 (six) hours as needed for wheezing or shortness of breath.     . tiotropium (SPIRIVA) 18 MCG inhalation capsule Place 1 capsule (18 mcg total) into inhaler and inhale daily. 30 capsule 6  . albuterol (PROVENTIL) (2.5 MG/3ML) 0.083% nebulizer solution INHALE 3 MILLILITERS (2.5 MG) BY NEBULIZATION ROUTE EVERY 8 HOURS AS NEEDED for shortness of breath  3  . lidocaine-prilocaine (EMLA) cream Apply 1 application topically as needed. (Patient not taking: Reported on 05/28/2016) 30 g 0   No current facility-administered medications for this visit.     SURGICAL HISTORY:  Past Surgical History:  Procedure Laterality Date  . APPENDECTOMY    . BACK SURGERY    .  CARDIOVERSION  11/27/2011   Procedure: CARDIOVERSION;  Surgeon: Thayer Headings, MD;  Location: [facility];  Service: Cardiovascular;  Laterality: N/A;  . LUNG CANCER SURGERY  2011    REVIEW OF SYSTEMS:  A comprehensive review of systems was negative except for: Respiratory: positive for dyspnea on exertion   PHYSICAL EXAMINATION: General appearance: alert, cooperative and no distress Head: Normocephalic, without obvious abnormality, atraumatic Neck: no adenopathy, no JVD, supple,  symmetrical, trachea midline and thyroid not enlarged, symmetric, no tenderness/mass/nodules Lymph nodes: Cervical, supraclavicular, and axillary nodes normal. Resp: wheezes bilaterally Back: symmetric, no curvature. ROM normal. No CVA tenderness. Cardio: regular rate and rhythm, S1, S2 normal, no murmur, click, rub or gallop GI: soft, non-tender; bowel sounds normal; no masses,  no organomegaly Extremities: extremities normal, atraumatic, no cyanosis or edema Neurologic: Alert and oriented X 3, normal strength and tone. Normal symmetric reflexes. Normal coordination and gait  ECOG PERFORMANCE STATUS: 1 - Symptomatic but completely ambulatory  Blood pressure 118/68, pulse 65, temperature 98.4 F (36.9 C), temperature source Oral, resp. rate 17, height 6' (1.829 m), weight 230 lb 8 oz (104.6 kg), SpO2 97 %.  LABORATORY DATA: Lab Results  Component Value Date   WBC 8.3 05/28/2016   HGB 13.8 05/28/2016   HCT 42.0 05/28/2016   MCV 88.2 05/28/2016   PLT 173 05/28/2016      Chemistry      Component Value Date/Time   NA 143 02/27/2016 0918   K 4.8 02/27/2016 0918   CL 108 01/16/2016 0525   CO2 33 (H) 02/27/2016 0918   BUN 13.5 02/27/2016 0918   CREATININE 1.0 02/27/2016 0918      Component Value Date/Time   CALCIUM 9.2 02/27/2016 0918   ALKPHOS 72 02/27/2016 0918   AST 12 02/27/2016 0918   ALT <9 02/27/2016 0918   BILITOT 0.39 02/27/2016 0918       RADIOGRAPHIC STUDIES: No results found.  ASSESSMENT AND PLAN: This is a very pleasant 65 years old white male diagnosed with a stage III large B-cell non-Hodgkin lymphoma and currently undergoing systemic chemotherapy with CHOP/Rituxan status post 6 cycles. He tolerated the last cycle of his treatment well. His last restaging PET scan showed significant improvement of his disease was almost complete response. The patient is currently on observation and feeling fine with no specific complaints. CBC today is unremarkable. I  recommended for him to continue on observation with repeat CT scan of the chest, abdomen and pelvis in 3 months for restaging of his disease. The patient will also have Port-A-Cath flush every 6 weeks. The patient was advised to call immediately if he has any concerning symptoms in the interval. The patient voices understanding of current disease status and treatment options and is in agreement with the current care plan.  All questions were answered. The patient knows to call the clinic with any problems, questions or concerns. We can certainly see the patient much sooner if necessary.  Disclaimer: This note was dictated with voice recognition software. Similar sounding words can inadvertently be transcribed and may not be corrected upon review.

## 2016-05-28 NOTE — Telephone Encounter (Signed)
Avs report and  appt schd given per  05/28/16 los. No scan ordered. Message sent to Dr. Julien Nordmann.

## 2016-05-30 ENCOUNTER — Other Ambulatory Visit: Payer: Self-pay | Admitting: Internal Medicine

## 2016-06-03 DIAGNOSIS — L03116 Cellulitis of left lower limb: Secondary | ICD-10-CM | POA: Diagnosis not present

## 2016-06-03 DIAGNOSIS — S80862A Insect bite (nonvenomous), left lower leg, initial encounter: Secondary | ICD-10-CM | POA: Diagnosis not present

## 2016-06-11 ENCOUNTER — Other Ambulatory Visit: Payer: Self-pay

## 2016-06-25 ENCOUNTER — Other Ambulatory Visit: Payer: Self-pay

## 2016-06-25 DIAGNOSIS — J441 Chronic obstructive pulmonary disease with (acute) exacerbation: Secondary | ICD-10-CM

## 2016-06-25 DIAGNOSIS — J449 Chronic obstructive pulmonary disease, unspecified: Secondary | ICD-10-CM | POA: Diagnosis not present

## 2016-06-25 NOTE — Patient Outreach (Signed)
[facility]) Care Management  06/25/2016  PASQUAL FARIAS 28-Oct-1950 [id number]  Initial telephonic contact for patient who self-referred for COPD disease management.  Patient reported he is currently in [place], driving his truck, and unable to participate in an initial assessment today . 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 States he is generally unavailable during the day, but may be able to take a call next week on 06-30-16.  RN will call patient on 06-30-16. RN will mail [facility] Welcome letter with pamphlet and magnet.  Candie Mile, RN, MSN [place] [phone] Fax [phone]

## 2016-06-29 DIAGNOSIS — F419 Anxiety disorder, unspecified: Secondary | ICD-10-CM | POA: Diagnosis not present

## 2016-06-29 DIAGNOSIS — G4700 Insomnia, unspecified: Secondary | ICD-10-CM | POA: Diagnosis not present

## 2016-06-29 DIAGNOSIS — J449 Chronic obstructive pulmonary disease, unspecified: Secondary | ICD-10-CM | POA: Diagnosis not present

## 2016-06-29 DIAGNOSIS — M1000 Idiopathic gout, unspecified site: Secondary | ICD-10-CM | POA: Diagnosis not present

## 2016-06-29 DIAGNOSIS — G894 Chronic pain syndrome: Secondary | ICD-10-CM | POA: Diagnosis not present

## 2016-06-30 ENCOUNTER — Other Ambulatory Visit: Payer: Self-pay

## 2016-06-30 NOTE — Patient Outreach (Signed)
[facility]) Care Management  06/30/2016  MARINO ROGERSON November 23, 1950 [id number]   Unsuccessful attempt to reach patient by phone.  HIPAA appropriate message left requesting call back. If no response RN will make another attempt within a week.  Candie Mile, RN, MSN [place] [phone] Fax [phone]

## 2016-07-07 ENCOUNTER — Other Ambulatory Visit: Payer: Self-pay

## 2016-07-07 NOTE — Patient Outreach (Signed)
[facility]) Care Management  07/07/2016  [name] 12/05/1950 [id number]   Third unsuccessful attempt to reach patient for initial assessment. RN will notify patient, CMA and PCP of case closure.  Candie Mile, RN, MSN [place] [phone] Fax [phone]

## 2016-07-09 ENCOUNTER — Ambulatory Visit (HOSPITAL_BASED_OUTPATIENT_CLINIC_OR_DEPARTMENT_OTHER): Payer: PPO

## 2016-07-09 VITALS — BP 116/64 | HR 66 | Temp 98.1°F | Resp 18

## 2016-07-09 DIAGNOSIS — Z95828 Presence of other vascular implants and grafts: Secondary | ICD-10-CM

## 2016-07-09 DIAGNOSIS — C8338 Diffuse large B-cell lymphoma, lymph nodes of multiple sites: Secondary | ICD-10-CM

## 2016-07-09 DIAGNOSIS — J449 Chronic obstructive pulmonary disease, unspecified: Secondary | ICD-10-CM | POA: Diagnosis not present

## 2016-07-09 DIAGNOSIS — Z452 Encounter for adjustment and management of vascular access device: Secondary | ICD-10-CM

## 2016-07-09 DIAGNOSIS — G894 Chronic pain syndrome: Secondary | ICD-10-CM | POA: Diagnosis not present

## 2016-07-09 DIAGNOSIS — M1 Idiopathic gout: Secondary | ICD-10-CM | POA: Diagnosis not present

## 2016-07-09 DIAGNOSIS — F419 Anxiety disorder, unspecified: Secondary | ICD-10-CM | POA: Diagnosis not present

## 2016-07-09 DIAGNOSIS — G47 Insomnia: Secondary | ICD-10-CM | POA: Diagnosis not present

## 2016-07-09 MED ORDER — SODIUM CHLORIDE 0.9% FLUSH
10.0000 mL | INTRAVENOUS | Status: DC | PRN
  Administered 2016-07-09: 10 mL via INTRAVENOUS
  Filled 2016-07-09: qty 10

## 2016-07-09 MED ORDER — HEPARIN SOD (PORK) LOCK FLUSH 100 UNIT/ML IV SOLN
500.0000 [IU] | Freq: Once | INTRAVENOUS | Status: AC
  Administered 2016-07-09: 500 [IU] via INTRAVENOUS
  Filled 2016-07-09: qty 5

## 2016-07-15 ENCOUNTER — Other Ambulatory Visit: Payer: Self-pay | Admitting: Pharmacist

## 2016-07-17 NOTE — Patient Outreach (Signed)
Bennye Alm PharmD is closing case to pharmacy on 07/17/16.

## 2016-07-17 NOTE — Patient Outreach (Signed)
[facility]) Care Management  [place]   07/17/2016  [name] 15-Dec-1950 [id number]  Subjective: 65 year old male referred to me for medication assistance with his Spiriva.  Patient states that his wife assists him with his medications and knows more about their finances so he asked me to talk to his wife.  Wife states that patient works part time and they both do receive social security income but they are having difficulty affording the Spiriva due to being in the coverage gap.   She states he is only taking spiriva and is no longer taking the [employer]. I performed medication reconciliation with patients wife.  She states Mr [name] has 1 week of Spiriva remaining.   Objective:   Encounter Medications: Outpatient Encounter Prescriptions as of 07/15/2016  Medication Sig  . albuterol (PROVENTIL) (2.5 MG/3ML) 0.083% nebulizer solution INHALE 3 MILLILITERS (2.5 MG) BY NEBULIZATION ROUTE EVERY 8 HOURS AS NEEDED for shortness of breath  . amLODipine (NORVASC) 5 MG tablet Take 1 tablet by mouth Daily.  [name] aspirin 325 MG tablet Take 325 mg by mouth daily.    . finasteride (PROSCAR) 5 MG tablet Take 5 mg by mouth daily.  [name] levothyroxine (SYNTHROID, LEVOTHROID) 150 MCG tablet Take 150 mcg by mouth daily.  [name] lisinopril (PRINIVIL,ZESTRIL) 10 MG tablet Take 1 tablet (10 mg total) by mouth daily.  [name] omeprazole (PRILOSEC) 20 MG capsule Take 1 tablet by mouth Twice daily.  [name] oxyCODONE-acetaminophen (PERCOCET) 10-325 MG tablet Take 1 tablet by mouth every 4 (four) hours as needed for pain.  [name] PROAIR HFA 108 (90 Base) MCG/ACT inhaler Take 2 puffs by mouth every 6 (six) hours as needed for wheezing or shortness of breath.   . tiotropium (SPIRIVA) 18 MCG inhalation capsule Place 1 capsule (18 mcg total) into inhaler and inhale daily.  . fluticasone furoate-vilanterol (BREO ELLIPTA) 100-25 MCG/INH AEPB Inhale 1 puff into the lungs daily. (Patient not taking: Reported on  07/15/2016)  . guaiFENesin (MUCINEX) 600 MG 12 hr tablet Take 1 tablet (600 mg total) by mouth 2 (two) times daily. (Patient not taking: Reported on 07/15/2016)  . lidocaine-prilocaine (EMLA) cream Apply 1 application topically as needed. (Patient not taking: Reported on 07/15/2016)   No facility-administered encounter medications on file as of 07/15/2016.     Functional Status: In your present state of health, do you have any difficulty performing the following activities: 01/14/2016 09/30/2015  Hearing? N N  Vision? Y N  Difficulty concentrating or making decisions? N N  Walking or climbing stairs? N Y  Dressing or bathing? N N  Doing errands, shopping? N -  Some recent data might be hidden    Fall/Depression Screening: PHQ 2/9 Scores 06/11/2016  PHQ - 2 Score 0    Assessment: Medication Assistance: [employer] Medicare patient currently in the coverage gap and unable to afford his Spiriva.  Wife states he is only taking Spiriva currently ([name] provided sample of Breo Ellipta back on 05/13/16).  His part time and social security income is above the maximum threshold for low income subsidy/extra help or manufacturer patient assistance. Unfortunately [facility] pharmacy cannot assist with medication assistance.    Plan: Instructed patients wife to call [name], MD and [name] to obtain Spiriva samples Will contact [name] and [name] to see if their office has samples of Spiriva to assist patient for the next 2 months until he is no longer in the coverage gap.  Pharmacy will not open program and will close case as we cannot assist with medication assistance based on patient income.  Bennye Alm, PharmD [facility] PGY2 Pharmacy Resident [phone]

## 2016-07-26 DIAGNOSIS — J449 Chronic obstructive pulmonary disease, unspecified: Secondary | ICD-10-CM | POA: Diagnosis not present

## 2016-07-28 DIAGNOSIS — G894 Chronic pain syndrome: Secondary | ICD-10-CM | POA: Diagnosis not present

## 2016-07-28 DIAGNOSIS — F419 Anxiety disorder, unspecified: Secondary | ICD-10-CM | POA: Diagnosis not present

## 2016-07-28 DIAGNOSIS — M1990 Unspecified osteoarthritis, unspecified site: Secondary | ICD-10-CM | POA: Diagnosis not present

## 2016-08-20 ENCOUNTER — Ambulatory Visit (HOSPITAL_BASED_OUTPATIENT_CLINIC_OR_DEPARTMENT_OTHER): Payer: PPO

## 2016-08-20 VITALS — BP 125/64 | HR 50 | Temp 98.4°F | Resp 18

## 2016-08-20 DIAGNOSIS — Z95828 Presence of other vascular implants and grafts: Secondary | ICD-10-CM

## 2016-08-20 DIAGNOSIS — C8338 Diffuse large B-cell lymphoma, lymph nodes of multiple sites: Secondary | ICD-10-CM

## 2016-08-20 DIAGNOSIS — Z452 Encounter for adjustment and management of vascular access device: Secondary | ICD-10-CM

## 2016-08-20 HISTORY — DX: Presence of other vascular implants and grafts: Z95.828

## 2016-08-20 MED ORDER — SODIUM CHLORIDE 0.9% FLUSH
10.0000 mL | INTRAVENOUS | Status: DC | PRN
  Administered 2016-08-20: 10 mL via INTRAVENOUS
  Filled 2016-08-20: qty 10

## 2016-08-20 MED ORDER — HEPARIN SOD (PORK) LOCK FLUSH 100 UNIT/ML IV SOLN
500.0000 [IU] | Freq: Once | INTRAVENOUS | Status: AC | PRN
  Administered 2016-08-20: 500 [IU] via INTRAVENOUS
  Filled 2016-08-20: qty 5

## 2016-08-25 DIAGNOSIS — J449 Chronic obstructive pulmonary disease, unspecified: Secondary | ICD-10-CM | POA: Diagnosis not present

## 2016-08-26 DIAGNOSIS — Z1211 Encounter for screening for malignant neoplasm of colon: Secondary | ICD-10-CM | POA: Diagnosis not present

## 2016-08-27 ENCOUNTER — Other Ambulatory Visit (HOSPITAL_BASED_OUTPATIENT_CLINIC_OR_DEPARTMENT_OTHER): Payer: PPO

## 2016-08-27 ENCOUNTER — Encounter (HOSPITAL_COMMUNITY): Payer: Self-pay

## 2016-08-27 ENCOUNTER — Ambulatory Visit (HOSPITAL_COMMUNITY)
Admission: RE | Admit: 2016-08-27 | Discharge: 2016-08-27 | Disposition: A | Discharge status: Home / Self Care | Payer: PPO | Source: Ambulatory Visit | Attending: Internal Medicine | Admitting: Internal Medicine

## 2016-08-27 DIAGNOSIS — I700 Atherosclerosis of aorta: Secondary | ICD-10-CM | POA: Insufficient documentation

## 2016-08-27 DIAGNOSIS — I2510 Atherosclerotic heart disease of native coronary artery without angina pectoris: Secondary | ICD-10-CM | POA: Diagnosis not present

## 2016-08-27 DIAGNOSIS — C8338 Diffuse large B-cell lymphoma, lymph nodes of multiple sites: Secondary | ICD-10-CM | POA: Diagnosis not present

## 2016-08-27 DIAGNOSIS — K5730 Diverticulosis of large intestine without perforation or abscess without bleeding: Secondary | ICD-10-CM | POA: Diagnosis not present

## 2016-08-27 DIAGNOSIS — C8298 Follicular lymphoma, unspecified, lymph nodes of multiple sites: Secondary | ICD-10-CM | POA: Diagnosis not present

## 2016-08-27 DIAGNOSIS — C8258 Diffuse follicle center lymphoma, lymph nodes of multiple sites: Secondary | ICD-10-CM

## 2016-08-27 DIAGNOSIS — N200 Calculus of kidney: Secondary | ICD-10-CM | POA: Diagnosis not present

## 2016-08-27 LAB — CBC WITH DIFFERENTIAL/PLATELET
BASO%: 0.7 % (ref 0.0–2.0)
BASOS ABS: 0 10*3/uL (ref 0.0–0.1)
EOS ABS: 0.2 10*3/uL (ref 0.0–0.5)
EOS%: 3.8 % (ref 0.0–7.0)
HCT: 41.6 % (ref 38.4–49.9)
HGB: 13.9 g/dL (ref 13.0–17.1)
LYMPH%: 23.7 % (ref 14.0–49.0)
MCH: 30 pg (ref 27.2–33.4)
MCHC: 33.3 g/dL (ref 32.0–36.0)
MCV: 90.2 fL (ref 79.3–98.0)
MONO#: 0.6 10*3/uL (ref 0.1–0.9)
MONO%: 10.6 % (ref 0.0–14.0)
NEUT%: 61.2 % (ref 39.0–75.0)
NEUTROS ABS: 3.5 10*3/uL (ref 1.5–6.5)
PLATELETS: 176 10*3/uL (ref 140–400)
RBC: 4.62 10*6/uL (ref 4.20–5.82)
RDW: 14.4 % (ref 11.0–14.6)
WBC: 5.7 10*3/uL (ref 4.0–10.3)
lymph#: 1.4 10*3/uL (ref 0.9–3.3)

## 2016-08-27 LAB — COMPREHENSIVE METABOLIC PANEL
ALT: 9 U/L (ref 0–55)
ANION GAP: 10 meq/L (ref 3–11)
AST: 11 U/L (ref 5–34)
Albumin: 4.1 g/dL (ref 3.5–5.0)
Alkaline Phosphatase: 96 U/L (ref 40–150)
BILIRUBIN TOTAL: 0.81 mg/dL (ref 0.20–1.20)
BUN: 11.7 mg/dL (ref 7.0–26.0)
CO2: 28 meq/L (ref 22–29)
Calcium: 9.5 mg/dL (ref 8.4–10.4)
Chloride: 101 mEq/L (ref 98–109)
Creatinine: 1.2 mg/dL (ref 0.7–1.3)
EGFR: 65 mL/min/{1.73_m2} — AB (ref >90)
Glucose: 94 mg/dl (ref 70–140)
POTASSIUM: 4 meq/L (ref 3.5–5.1)
Sodium: 139 mEq/L (ref 136–145)
TOTAL PROTEIN: 7.2 g/dL (ref 6.4–8.3)

## 2016-08-27 LAB — LACTATE DEHYDROGENASE: LDH: 90 U/L — ABNORMAL LOW (ref 125–245)

## 2016-08-27 MED ORDER — IOPAMIDOL (ISOVUE-300) INJECTION 61%
100.0000 mL | Freq: Once | INTRAVENOUS | Status: AC | PRN
  Administered 2016-08-27: 100 mL via INTRAVENOUS

## 2016-08-27 MED ORDER — IOPAMIDOL (ISOVUE-300) INJECTION 61%
30.0000 mL | Freq: Once | INTRAVENOUS | Status: AC | PRN
  Administered 2016-08-27: 30 mL via ORAL

## 2016-09-01 ENCOUNTER — Ambulatory Visit (HOSPITAL_BASED_OUTPATIENT_CLINIC_OR_DEPARTMENT_OTHER): Payer: PPO | Admitting: Internal Medicine

## 2016-09-01 ENCOUNTER — Telehealth: Payer: Self-pay | Admitting: Internal Medicine

## 2016-09-01 ENCOUNTER — Encounter: Payer: Self-pay | Admitting: Internal Medicine

## 2016-09-01 VITALS — BP 141/67 | HR 60 | Temp 98.3°F | Resp 18 | Ht 72.0 in | Wt 235.4 lb

## 2016-09-01 DIAGNOSIS — C8258 Diffuse follicle center lymphoma, lymph nodes of multiple sites: Secondary | ICD-10-CM

## 2016-09-01 DIAGNOSIS — C8338 Diffuse large B-cell lymphoma, lymph nodes of multiple sites: Secondary | ICD-10-CM

## 2016-09-01 DIAGNOSIS — C8332 Diffuse large B-cell lymphoma, intrathoracic lymph nodes: Secondary | ICD-10-CM

## 2016-09-01 NOTE — Telephone Encounter (Signed)
Appointments scheduled per 12/12 LOS. Patient given AVS report and calendars with future scheduled appointments.  °

## 2016-09-01 NOTE — Progress Notes (Signed)
[place] Telephone:(336) [phone]   Fax:(336) [place], [place] [address]  DIAGNOSIS: Stage III large B-cell non-Hodgkin lymphoma diagnosed in December 2016 and presented with extensive lymphadenopathy involving the neck, chest, abdomen as well as splenomegaly.   PRIOR THERAPY: Systemic chemotherapy with the CHOP/Rituxan every 3 weeks with Neulasta support. Status post 6 cycles.  CURRENT THERAPY: Observation.   INTERVAL HISTORY: [name] 65 y.o. male 6 months follow-up visit accompanied by his wife. The patient is feeling fine today was no specific complaints. He denied having any chest pain, shortness breath, cough or hemoptysis. He has no significant weight loss or night sweats. He has no palpable lymphadenopathy. He had repeat CT scan of the chest, abdomen and pelvis performed recently and he is here for evaluation and discussion of his scan results.  MEDICAL HISTORY: Past Medical History:  Diagnosis Date  . BPH (benign prostatic hyperplasia) 2011  . Congestive heart failure ([place])   . COPD (chronic obstructive pulmonary disease) ([place])   . Depression   . Drug-induced neutropenia ([place]) 10/08/2015  . Encounter for antineoplastic chemotherapy 10/08/2015  . Gastrointestinal obstruction    Ileus  . Gout   . Hypertension   . Hypothyroid   . Lung cancer ([place])   . NHL (non-Hodgkin's lymphoma) ([place]) 09/25/2015  . Obesity   . Polycythemia   . Pulmonary hypertension   . Sepsis ([place]) 01/14/2016  . Streptococcal pneumonia [facility]) August 2009    ALLERGIES:  has No Known Allergies.  MEDICATIONS:  Current Outpatient Prescriptions  Medication Sig Dispense Refill  . albuterol (PROVENTIL) (2.5 MG/3ML) 0.083% nebulizer solution INHALE 3 MILLILITERS (2.5 MG) BY NEBULIZATION ROUTE EVERY 8 HOURS AS NEEDED for shortness of breath  3  . amLODipine (NORVASC) 5 MG tablet Take 1 tablet by mouth Daily.    [name]  aspirin 325 MG tablet Take 325 mg by mouth daily.      . finasteride (PROSCAR) 5 MG tablet Take 5 mg by mouth daily.    . fluticasone furoate-vilanterol (BREO ELLIPTA) 100-25 MCG/INH AEPB Inhale 1 puff into the lungs daily. (Patient not taking: Reported on 07/15/2016) 14 each 0  . guaiFENesin (MUCINEX) 600 MG 12 hr tablet Take 1 tablet (600 mg total) by mouth 2 (two) times daily. (Patient not taking: Reported on 07/15/2016) 30 tablet 0  . levothyroxine (SYNTHROID, LEVOTHROID) 150 MCG tablet Take 150 mcg by mouth daily.    [name] lidocaine-prilocaine (EMLA) cream Apply 1 application topically as needed. (Patient not taking: Reported on 07/15/2016) 30 g 0  . lisinopril (PRINIVIL,ZESTRIL) 10 MG tablet Take 1 tablet (10 mg total) by mouth daily. 30 tablet 11  . omeprazole (PRILOSEC) 20 MG capsule Take 1 tablet by mouth Twice daily.    [name] oxyCODONE-acetaminophen (PERCOCET) 10-325 MG tablet Take 1 tablet by mouth every 4 (four) hours as needed for pain.    [name] PROAIR HFA 108 (90 Base) MCG/ACT inhaler Take 2 puffs by mouth every 6 (six) hours as needed for wheezing or shortness of breath.     . tiotropium (SPIRIVA) 18 MCG inhalation capsule Place 1 capsule (18 mcg total) into inhaler and inhale daily. 30 capsule 6   No current facility-administered medications for this visit.     SURGICAL HISTORY:  Past Surgical History:  Procedure Laterality Date  . APPENDECTOMY    . BACK SURGERY    . CARDIOVERSION  11/27/2011   Procedure:  CARDIOVERSION;  Surgeon: Thayer Headings, MD;  Location: [place];  Service: Cardiovascular;  Laterality: N/A;  . LUNG CANCER SURGERY  2011    REVIEW OF SYSTEMS:  A comprehensive review of systems was negative.   PHYSICAL EXAMINATION: General appearance: alert, cooperative, appears stated age and no distress Head: Normocephalic, without obvious abnormality, atraumatic Neck: no adenopathy, no JVD, supple, symmetrical, trachea midline and thyroid not enlarged, symmetric, no  tenderness/mass/nodules Lymph nodes: Cervical, supraclavicular, and axillary nodes normal. Resp: clear to auscultation bilaterally Back: symmetric, no curvature. ROM normal. No CVA tenderness. Cardio: regular rate and rhythm, S1, S2 normal, no murmur, click, rub or gallop GI: soft, non-tender; bowel sounds normal; no masses,  no organomegaly Extremities: extremities normal, atraumatic, no cyanosis or edema  ECOG PERFORMANCE STATUS: 1 - Symptomatic but completely ambulatory  Blood pressure (!) 141/67, pulse 60, temperature 98.3 F (36.8 C), temperature source Oral, resp. rate 18, height 6' (1.829 m), weight 235 lb 6.4 oz (106.8 kg), SpO2 95 %.  LABORATORY DATA: Lab Results  Component Value Date   WBC 5.7 08/27/2016   HGB 13.9 08/27/2016   HCT 41.6 08/27/2016   MCV 90.2 08/27/2016   PLT 176 08/27/2016      Chemistry      Component Value Date/Time   NA 139 08/27/2016 0854   K 4.0 08/27/2016 0854   CL 108 01/16/2016 0525   CO2 28 08/27/2016 0854   BUN 11.7 08/27/2016 0854   CREATININE 1.2 08/27/2016 0854      Component Value Date/Time   CALCIUM 9.5 08/27/2016 0854   ALKPHOS 96 08/27/2016 0854   AST 11 08/27/2016 0854   ALT 9 08/27/2016 0854   BILITOT 0.81 08/27/2016 0854       RADIOGRAPHIC STUDIES: Ct Chest W Contrast  Result Date: 08/27/2016 CLINICAL DATA:  Followup non-Hodgkin's diffuse follicular lymphoma. Ongoing chemotherapy. Restaging. EXAM: CT CHEST, ABDOMEN, AND PELVIS WITH CONTRAST TECHNIQUE: Multidetector CT imaging of the chest, abdomen and pelvis was performed following the standard protocol during bolus administration of intravenous contrast. CONTRAST:  139m ISOVUE-300 IOPAMIDOL (ISOVUE-300) INJECTION 61% COMPARISON:  PET-CT on 02/24/2016 FINDINGS: CT CHEST FINDINGS Cardiovascular: No acute findings. Right-sided Port-A-Cath remains in appropriate position. Aortic atherosclerosis. Coronary artery calcification. Mediastinum/Lymph Nodes: No masses or  pathologically enlarged lymph nodes identified. Lungs/Pleura: Surgical staples again seen in medial left lung apex. Mild bilateral pleural- parenchymal scarring remains stable. Pleural calcification in the lower right hemithorax is unchanged. No evidence of pleural soft tissue masses or effusion. No pulmonary infiltrate or mass identified. No effusion present. Musculoskeletal:  No suspicious bone lesions identified. CT ABDOMEN AND PELVIS FINDINGS Hepatobiliary: No masses identified. Gallbladder is unremarkable. Pancreas:  No mass or inflammatory changes. Spleen: No evidence of splenomegaly. Low-attenuation lesion in the inferior aspect of the spleen measuring 3.7 x 4.7 cm shows no significant change compared with prior study. This showed absence of hyper metabolism on previous study. Adrenals/Urinary tract: Stable tiny right adrenal nodule since 2011, consistent with benign adenoma. Tiny 2-3 mm nonobstructive renal calculi and bilateral renal cysts show no significant change. No evidence of renal masses or hydronephrosis. Urinary bladder is nearly completely empty. Stomach/Bowel: No evidence of obstruction, inflammatory process, or abnormal fluid collections. Mild sigmoid diverticulosis is noted, without evidence of diverticulitis. Vascular/Lymphatic: Tiny abdominal retroperitoneal lymph nodes measuring up to 10 mm are stable since previous study. No new lymphadenopathy identified. No abdominal aortic aneurysm. Aortic atherosclerosis. Reproductive:  No mass or other significant abnormality identified. Other:  None. Musculoskeletal:  No suspicious bone lesions identified. IMPRESSION: Stable exam. No evidence of recurrent lymphoma or other acute findings. Tiny nonobstructive bilateral renal calculi. Colonic diverticulosis. No radiographic evidence of diverticulitis. Aortic and coronary atherosclerosis. Electronically Signed   By: Earle Gell M.D.   On: 08/27/2016 14:05   Ct Abdomen Pelvis W Contrast  Result Date:  08/27/2016 CLINICAL DATA:  Followup non-Hodgkin's diffuse follicular lymphoma. Ongoing chemotherapy. Restaging. EXAM: CT CHEST, ABDOMEN, AND PELVIS WITH CONTRAST TECHNIQUE: Multidetector CT imaging of the chest, abdomen and pelvis was performed following the standard protocol during bolus administration of intravenous contrast. CONTRAST:  154m ISOVUE-300 IOPAMIDOL (ISOVUE-300) INJECTION 61% COMPARISON:  PET-CT on 02/24/2016 FINDINGS: CT CHEST FINDINGS Cardiovascular: No acute findings. Right-sided Port-A-Cath remains in appropriate position. Aortic atherosclerosis. Coronary artery calcification. Mediastinum/Lymph Nodes: No masses or pathologically enlarged lymph nodes identified. Lungs/Pleura: Surgical staples again seen in medial left lung apex. Mild bilateral pleural- parenchymal scarring remains stable. Pleural calcification in the lower right hemithorax is unchanged. No evidence of pleural soft tissue masses or effusion. No pulmonary infiltrate or mass identified. No effusion present. Musculoskeletal:  No suspicious bone lesions identified. CT ABDOMEN AND PELVIS FINDINGS Hepatobiliary: No masses identified. Gallbladder is unremarkable. Pancreas:  No mass or inflammatory changes. Spleen: No evidence of splenomegaly. Low-attenuation lesion in the inferior aspect of the spleen measuring 3.7 x 4.7 cm shows no significant change compared with prior study. This showed absence of hyper metabolism on previous study. Adrenals/Urinary tract: Stable tiny right adrenal nodule since 2011, consistent with benign adenoma. Tiny 2-3 mm nonobstructive renal calculi and bilateral renal cysts show no significant change. No evidence of renal masses or hydronephrosis. Urinary bladder is nearly completely empty. Stomach/Bowel: No evidence of obstruction, inflammatory process, or abnormal fluid collections. Mild sigmoid diverticulosis is noted, without evidence of diverticulitis. Vascular/Lymphatic: Tiny abdominal retroperitoneal  lymph nodes measuring up to 10 mm are stable since previous study. No new lymphadenopathy identified. No abdominal aortic aneurysm. Aortic atherosclerosis. Reproductive:  No mass or other significant abnormality identified. Other:  None. Musculoskeletal:  No suspicious bone lesions identified. IMPRESSION: Stable exam. No evidence of recurrent lymphoma or other acute findings. Tiny nonobstructive bilateral renal calculi. Colonic diverticulosis. No radiographic evidence of diverticulitis. Aortic and coronary atherosclerosis. Electronically Signed   By: JEarle GellM.D.   On: 08/27/2016 14:05    ASSESSMENT AND PLAN: This is a very pleasant 65years old white male with stage IIIa large B-cell non-Hodgkin lymphoma status post 6 cycles of systemic chemotherapy with CHOP/Rituxan.  The patient is currently on observation and has no concerning complaints. The recent CT scan of the chest, abdomen and pelvis showed no evidence for disease recurrence. I discussed the scan results with the patient and his wife. I recommended for him to continue on observation with repeat CBC, comprehensive metabolic panel and LDH and 6 months. He will continue to have Port-A-Cath flush every 6 weeks. He was advised to call immediately if he has any concerning symptoms in the interval. The patient voices understanding of current disease status and treatment options and is in agreement with the current care plan.  All questions were answered. The patient knows to call the clinic with any problems, questions or concerns. We can certainly see the patient much sooner if necessary. I spent 10 minutes counseling the patient face to face. The total time spent in the appointment was 15 minutes. Disclaimer: This note was dictated with voice recognition software. Similar sounding words can inadvertently be transcribed and may not be corrected  upon review.

## 2016-09-24 DIAGNOSIS — E039 Hypothyroidism, unspecified: Secondary | ICD-10-CM | POA: Diagnosis not present

## 2016-09-24 DIAGNOSIS — K219 Gastro-esophageal reflux disease without esophagitis: Secondary | ICD-10-CM | POA: Diagnosis not present

## 2016-09-24 DIAGNOSIS — L2083 Infantile (acute) (chronic) eczema: Secondary | ICD-10-CM | POA: Diagnosis not present

## 2016-09-24 DIAGNOSIS — J449 Chronic obstructive pulmonary disease, unspecified: Secondary | ICD-10-CM | POA: Diagnosis not present

## 2016-09-24 DIAGNOSIS — G8929 Other chronic pain: Secondary | ICD-10-CM | POA: Diagnosis not present

## 2016-09-24 DIAGNOSIS — R5383 Other fatigue: Secondary | ICD-10-CM | POA: Diagnosis not present

## 2016-09-24 DIAGNOSIS — M1 Gout: Secondary | ICD-10-CM | POA: Diagnosis not present

## 2016-09-24 DIAGNOSIS — G894 Chronic pain syndrome: Secondary | ICD-10-CM | POA: Diagnosis not present

## 2016-09-25 DIAGNOSIS — J449 Chronic obstructive pulmonary disease, unspecified: Secondary | ICD-10-CM | POA: Diagnosis not present

## 2016-10-01 ENCOUNTER — Ambulatory Visit (HOSPITAL_BASED_OUTPATIENT_CLINIC_OR_DEPARTMENT_OTHER): Payer: PPO

## 2016-10-01 VITALS — BP 142/68 | HR 75 | Temp 98.4°F | Resp 20

## 2016-10-01 DIAGNOSIS — C8338 Diffuse large B-cell lymphoma, lymph nodes of multiple sites: Secondary | ICD-10-CM | POA: Diagnosis not present

## 2016-10-01 DIAGNOSIS — Z452 Encounter for adjustment and management of vascular access device: Secondary | ICD-10-CM

## 2016-10-01 DIAGNOSIS — Z95828 Presence of other vascular implants and grafts: Secondary | ICD-10-CM

## 2016-10-01 MED ORDER — HEPARIN SOD (PORK) LOCK FLUSH 100 UNIT/ML IV SOLN
500.0000 [IU] | Freq: Once | INTRAVENOUS | Status: AC | PRN
  Administered 2016-10-01: 500 [IU] via INTRAVENOUS
  Filled 2016-10-01: qty 5

## 2016-10-01 MED ORDER — SODIUM CHLORIDE 0.9% FLUSH
10.0000 mL | INTRAVENOUS | Status: DC | PRN
  Administered 2016-10-01: 10 mL via INTRAVENOUS
  Filled 2016-10-01: qty 10

## 2016-10-01 NOTE — Patient Instructions (Signed)

## 2016-10-22 DIAGNOSIS — G894 Chronic pain syndrome: Secondary | ICD-10-CM | POA: Diagnosis not present

## 2016-10-22 DIAGNOSIS — I973 Postprocedural hypertension: Secondary | ICD-10-CM | POA: Diagnosis not present

## 2016-10-22 DIAGNOSIS — M1991 Primary osteoarthritis, unspecified site: Secondary | ICD-10-CM | POA: Diagnosis not present

## 2016-10-22 DIAGNOSIS — J449 Chronic obstructive pulmonary disease, unspecified: Secondary | ICD-10-CM | POA: Diagnosis not present

## 2016-10-26 DIAGNOSIS — J449 Chronic obstructive pulmonary disease, unspecified: Secondary | ICD-10-CM | POA: Diagnosis not present

## 2016-11-12 ENCOUNTER — Ambulatory Visit (HOSPITAL_BASED_OUTPATIENT_CLINIC_OR_DEPARTMENT_OTHER): Payer: PPO

## 2016-11-12 VITALS — BP 152/68 | HR 51 | Temp 98.6°F | Resp 18

## 2016-11-12 DIAGNOSIS — C8338 Diffuse large B-cell lymphoma, lymph nodes of multiple sites: Secondary | ICD-10-CM | POA: Diagnosis not present

## 2016-11-12 DIAGNOSIS — Z452 Encounter for adjustment and management of vascular access device: Secondary | ICD-10-CM | POA: Diagnosis not present

## 2016-11-12 DIAGNOSIS — Z95828 Presence of other vascular implants and grafts: Secondary | ICD-10-CM

## 2016-11-12 MED ORDER — HEPARIN SOD (PORK) LOCK FLUSH 100 UNIT/ML IV SOLN
500.0000 [IU] | Freq: Once | INTRAVENOUS | Status: AC | PRN
  Administered 2016-11-12: 500 [IU] via INTRAVENOUS
  Filled 2016-11-12: qty 5

## 2016-11-12 MED ORDER — SODIUM CHLORIDE 0.9% FLUSH
10.0000 mL | INTRAVENOUS | Status: DC | PRN
  Administered 2016-11-12: 10 mL via INTRAVENOUS
  Filled 2016-11-12: qty 10

## 2016-11-19 DIAGNOSIS — I973 Postprocedural hypertension: Secondary | ICD-10-CM | POA: Diagnosis not present

## 2016-11-19 DIAGNOSIS — G894 Chronic pain syndrome: Secondary | ICD-10-CM | POA: Diagnosis not present

## 2016-11-19 DIAGNOSIS — F419 Anxiety disorder, unspecified: Secondary | ICD-10-CM | POA: Diagnosis not present

## 2016-11-19 DIAGNOSIS — M1991 Primary osteoarthritis, unspecified site: Secondary | ICD-10-CM | POA: Diagnosis not present

## 2016-11-23 DIAGNOSIS — J449 Chronic obstructive pulmonary disease, unspecified: Secondary | ICD-10-CM | POA: Diagnosis not present

## 2016-12-16 DIAGNOSIS — J449 Chronic obstructive pulmonary disease, unspecified: Secondary | ICD-10-CM | POA: Diagnosis not present

## 2016-12-16 DIAGNOSIS — G894 Chronic pain syndrome: Secondary | ICD-10-CM | POA: Diagnosis not present

## 2016-12-16 DIAGNOSIS — M1380 Other specified arthritis, unspecified site: Secondary | ICD-10-CM | POA: Diagnosis not present

## 2016-12-16 DIAGNOSIS — I10 Essential (primary) hypertension: Secondary | ICD-10-CM | POA: Diagnosis not present

## 2016-12-16 DIAGNOSIS — F419 Anxiety disorder, unspecified: Secondary | ICD-10-CM | POA: Diagnosis not present

## 2016-12-24 ENCOUNTER — Ambulatory Visit (HOSPITAL_BASED_OUTPATIENT_CLINIC_OR_DEPARTMENT_OTHER): Payer: PPO

## 2016-12-24 VITALS — BP 150/66 | HR 68 | Temp 98.7°F | Resp 18

## 2016-12-24 DIAGNOSIS — Z452 Encounter for adjustment and management of vascular access device: Secondary | ICD-10-CM | POA: Diagnosis not present

## 2016-12-24 DIAGNOSIS — C8338 Diffuse large B-cell lymphoma, lymph nodes of multiple sites: Secondary | ICD-10-CM | POA: Diagnosis not present

## 2016-12-24 DIAGNOSIS — Z95828 Presence of other vascular implants and grafts: Secondary | ICD-10-CM

## 2016-12-24 DIAGNOSIS — J449 Chronic obstructive pulmonary disease, unspecified: Secondary | ICD-10-CM | POA: Diagnosis not present

## 2016-12-24 MED ORDER — SODIUM CHLORIDE 0.9% FLUSH
10.0000 mL | INTRAVENOUS | Status: DC | PRN
  Administered 2016-12-24: 10 mL via INTRAVENOUS
  Filled 2016-12-24: qty 10

## 2016-12-24 MED ORDER — HEPARIN SOD (PORK) LOCK FLUSH 100 UNIT/ML IV SOLN
500.0000 [IU] | Freq: Once | INTRAVENOUS | Status: AC | PRN
  Administered 2016-12-24: 500 [IU] via INTRAVENOUS
  Filled 2016-12-24: qty 5

## 2017-01-14 DIAGNOSIS — C7800 Secondary malignant neoplasm of unspecified lung: Secondary | ICD-10-CM | POA: Diagnosis not present

## 2017-01-14 DIAGNOSIS — N400 Enlarged prostate without lower urinary tract symptoms: Secondary | ICD-10-CM | POA: Diagnosis not present

## 2017-01-14 DIAGNOSIS — I10 Essential (primary) hypertension: Secondary | ICD-10-CM | POA: Diagnosis not present

## 2017-01-14 DIAGNOSIS — M1000 Idiopathic gout, unspecified site: Secondary | ICD-10-CM | POA: Diagnosis not present

## 2017-01-14 DIAGNOSIS — J449 Chronic obstructive pulmonary disease, unspecified: Secondary | ICD-10-CM | POA: Diagnosis not present

## 2017-01-14 DIAGNOSIS — G894 Chronic pain syndrome: Secondary | ICD-10-CM | POA: Diagnosis not present

## 2017-01-14 DIAGNOSIS — M1991 Primary osteoarthritis, unspecified site: Secondary | ICD-10-CM | POA: Diagnosis not present

## 2017-01-23 DIAGNOSIS — J449 Chronic obstructive pulmonary disease, unspecified: Secondary | ICD-10-CM | POA: Diagnosis not present

## 2017-02-04 ENCOUNTER — Ambulatory Visit (HOSPITAL_BASED_OUTPATIENT_CLINIC_OR_DEPARTMENT_OTHER): Payer: PPO

## 2017-02-04 VITALS — BP 164/75 | HR 50 | Temp 97.9°F | Resp 18

## 2017-02-04 DIAGNOSIS — C8338 Diffuse large B-cell lymphoma, lymph nodes of multiple sites: Secondary | ICD-10-CM | POA: Diagnosis not present

## 2017-02-04 DIAGNOSIS — Z452 Encounter for adjustment and management of vascular access device: Secondary | ICD-10-CM

## 2017-02-04 DIAGNOSIS — Z95828 Presence of other vascular implants and grafts: Secondary | ICD-10-CM

## 2017-02-04 MED ORDER — HEPARIN SOD (PORK) LOCK FLUSH 100 UNIT/ML IV SOLN
500.0000 [IU] | Freq: Once | INTRAVENOUS | Status: AC | PRN
  Administered 2017-02-04: 500 [IU] via INTRAVENOUS
  Filled 2017-02-04: qty 5

## 2017-02-04 MED ORDER — SODIUM CHLORIDE 0.9% FLUSH
10.0000 mL | INTRAVENOUS | Status: DC | PRN
  Administered 2017-02-04: 10 mL via INTRAVENOUS
  Filled 2017-02-04: qty 10

## 2017-02-04 NOTE — Patient Instructions (Signed)

## 2017-02-10 DIAGNOSIS — M1000 Idiopathic gout, unspecified site: Secondary | ICD-10-CM | POA: Diagnosis not present

## 2017-02-10 DIAGNOSIS — J449 Chronic obstructive pulmonary disease, unspecified: Secondary | ICD-10-CM | POA: Diagnosis not present

## 2017-02-10 DIAGNOSIS — F419 Anxiety disorder, unspecified: Secondary | ICD-10-CM | POA: Diagnosis not present

## 2017-02-10 DIAGNOSIS — G894 Chronic pain syndrome: Secondary | ICD-10-CM | POA: Diagnosis not present

## 2017-02-10 DIAGNOSIS — I10 Essential (primary) hypertension: Secondary | ICD-10-CM | POA: Diagnosis not present

## 2017-02-23 DIAGNOSIS — J449 Chronic obstructive pulmonary disease, unspecified: Secondary | ICD-10-CM | POA: Diagnosis not present

## 2017-03-02 ENCOUNTER — Other Ambulatory Visit (HOSPITAL_BASED_OUTPATIENT_CLINIC_OR_DEPARTMENT_OTHER): Payer: PPO

## 2017-03-02 ENCOUNTER — Telehealth: Payer: Self-pay | Admitting: Internal Medicine

## 2017-03-02 ENCOUNTER — Ambulatory Visit (HOSPITAL_BASED_OUTPATIENT_CLINIC_OR_DEPARTMENT_OTHER): Payer: PPO | Admitting: Internal Medicine

## 2017-03-02 ENCOUNTER — Ambulatory Visit: Payer: PPO

## 2017-03-02 ENCOUNTER — Encounter: Payer: Self-pay | Admitting: Internal Medicine

## 2017-03-02 VITALS — BP 159/70 | HR 50 | Temp 98.1°F | Resp 19 | Ht 72.0 in | Wt 239.7 lb

## 2017-03-02 DIAGNOSIS — C8338 Diffuse large B-cell lymphoma, lymph nodes of multiple sites: Secondary | ICD-10-CM

## 2017-03-02 DIAGNOSIS — J449 Chronic obstructive pulmonary disease, unspecified: Secondary | ICD-10-CM | POA: Diagnosis not present

## 2017-03-02 DIAGNOSIS — R0602 Shortness of breath: Secondary | ICD-10-CM | POA: Diagnosis not present

## 2017-03-02 DIAGNOSIS — I10 Essential (primary) hypertension: Secondary | ICD-10-CM

## 2017-03-02 DIAGNOSIS — C8258 Diffuse follicle center lymphoma, lymph nodes of multiple sites: Secondary | ICD-10-CM

## 2017-03-02 DIAGNOSIS — D649 Anemia, unspecified: Secondary | ICD-10-CM

## 2017-03-02 DIAGNOSIS — C8332 Diffuse large B-cell lymphoma, intrathoracic lymph nodes: Secondary | ICD-10-CM

## 2017-03-02 DIAGNOSIS — R001 Bradycardia, unspecified: Secondary | ICD-10-CM

## 2017-03-02 DIAGNOSIS — Z95828 Presence of other vascular implants and grafts: Secondary | ICD-10-CM

## 2017-03-02 LAB — COMPREHENSIVE METABOLIC PANEL
ALT: 8 U/L (ref 0–55)
AST: 11 U/L (ref 5–34)
Albumin: 3.7 g/dL (ref 3.5–5.0)
Alkaline Phosphatase: 73 U/L (ref 40–150)
Anion Gap: 8 mEq/L (ref 3–11)
BILIRUBIN TOTAL: 0.53 mg/dL (ref 0.20–1.20)
BUN: 14.8 mg/dL (ref 7.0–26.0)
CO2: 32 meq/L — AB (ref 22–29)
CREATININE: 0.9 mg/dL (ref 0.7–1.3)
Calcium: 8.8 mg/dL (ref 8.4–10.4)
Chloride: 102 mEq/L (ref 98–109)
EGFR: >90 mL/min/{1.73_m2} (ref >90)
GLUCOSE: 100 mg/dL (ref 70–140)
Potassium: 4.3 mEq/L (ref 3.5–5.1)
SODIUM: 143 meq/L (ref 136–145)
TOTAL PROTEIN: 6 g/dL — AB (ref 6.4–8.3)

## 2017-03-02 LAB — CBC WITH DIFFERENTIAL/PLATELET
BASO%: 0.5 % (ref 0.0–2.0)
Basophils Absolute: 0 10*3/uL (ref 0.0–0.1)
EOS%: 3.4 % (ref 0.0–7.0)
Eosinophils Absolute: 0.2 10*3/uL (ref 0.0–0.5)
HCT: 38.1 % — ABNORMAL LOW (ref 38.4–49.9)
HGB: 12.7 g/dL — ABNORMAL LOW (ref 13.0–17.1)
LYMPH%: 19.2 % (ref 14.0–49.0)
MCH: 29.5 pg (ref 27.2–33.4)
MCHC: 33.4 g/dL (ref 32.0–36.0)
MCV: 88.3 fL (ref 79.3–98.0)
MONO#: 0.4 10*3/uL (ref 0.1–0.9)
MONO%: 7.9 % (ref 0.0–14.0)
NEUT%: 69 % (ref 39.0–75.0)
NEUTROS ABS: 3.8 10*3/uL (ref 1.5–6.5)
Platelets: 155 10*3/uL (ref 140–400)
RBC: 4.31 10*6/uL (ref 4.20–5.82)
RDW: 14.9 % — ABNORMAL HIGH (ref 11.0–14.6)
WBC: 5.5 10*3/uL (ref 4.0–10.3)
lymph#: 1 10*3/uL (ref 0.9–3.3)

## 2017-03-02 LAB — LACTATE DEHYDROGENASE: LDH: 92 U/L — ABNORMAL LOW (ref 125–245)

## 2017-03-02 MED ORDER — HEPARIN SOD (PORK) LOCK FLUSH 100 UNIT/ML IV SOLN
500.0000 [IU] | Freq: Once | INTRAVENOUS | Status: AC | PRN
  Administered 2017-03-02: 500 [IU] via INTRAVENOUS
  Filled 2017-03-02: qty 5

## 2017-03-02 MED ORDER — SODIUM CHLORIDE 0.9% FLUSH
10.0000 mL | INTRAVENOUS | Status: DC | PRN
Start: 2017-03-02 — End: 2017-03-02
  Administered 2017-03-02: 10 mL via INTRAVENOUS
  Filled 2017-03-02: qty 10

## 2017-03-02 NOTE — Telephone Encounter (Signed)
Scheduled appt per 6/11 los and scheduled additional flushed every 6 weeks per 09/01/16 los & standing flush order. Gave patient AVS and calender.

## 2017-03-02 NOTE — Patient Instructions (Signed)

## 2017-03-02 NOTE — Progress Notes (Signed)
[place] [phone]   [phone]  OFFICE PROGRESS NOTE  [name], [place] [address]  DIAGNOSIS: Stage III large B-cell non-Hodgkin lymphoma diagnosed in December 2016 and presented with extensive lymphadenopathy involving the neck, chest, abdomen as well as splenomegaly.   PRIOR THERAPY: Systemic chemotherapy with the CHOP/Rituxan every 3 weeks with Neulasta support. Status post 6 cycles.  CURRENT THERAPY: Observation.   INTERVAL HISTORY: [name] 66 y.o. male returns to the clinic today for six-month follow-up visit. The patient is feeling fine today with no specific complaints. He denied having any chest pain but continues to have shortness of breath with exertion with no cough or hemoptysis. He gained 5 pounds since his last visit. He denied having any nausea or vomiting. He has no fever or chills. He has no palpable lymphadenopathy. He is here today for evaluation with repeat blood work.   MEDICAL HISTORY: Past Medical History:  Diagnosis Date  . BPH (benign prostatic hyperplasia) 2011  . Congestive heart failure ([facility])   . COPD (chronic obstructive pulmonary disease) ([place])   . Depression   . Drug-induced neutropenia ([place]) 10/08/2015  . Encounter for antineoplastic chemotherapy 10/08/2015  . Gastrointestinal obstruction (HCC)    Ileus  . Gout   . Hypertension   . Hypothyroid   . Lung cancer ([place])   . NHL (non-Hodgkin's lymphoma) ([place]) 09/25/2015  . Obesity   . Polycythemia   . Pulmonary hypertension ([place])   . Sepsis ([place]) 01/14/2016  . Streptococcal pneumonia [facility]) August 2009    ALLERGIES:  has No Known Allergies.  MEDICATIONS:  Current Outpatient Prescriptions  Medication Sig Dispense Refill  . allopurinol (ZYLOPRIM) 300 MG tablet Take 300 mg by mouth daily.    [name] ALPRAZolam (XANAX) 1 MG tablet Take 1 mg by mouth at bedtime as needed for sleep.    [name] amLODipine (NORVASC) 5 MG tablet  Take 1 tablet by mouth Daily.    . betamethasone dipropionate 0.05 % lotion Apply 1 application topically 2 (two) times daily as needed.    . finasteride (PROSCAR) 5 MG tablet Take 5 mg by mouth daily.    [name] levothyroxine (SYNTHROID, LEVOTHROID) 150 MCG tablet Take 150 mcg by mouth daily.    [name] lidocaine-prilocaine (EMLA) cream Apply 1 application topically as needed. 30 g 0  . lisinopril (PRINIVIL,ZESTRIL) 10 MG tablet Take 1 tablet (10 mg total) by mouth daily. 30 tablet 11  . omeprazole (PRILOSEC) 20 MG capsule Take 1 tablet by mouth Twice daily.    [name] oxyCODONE-acetaminophen (PERCOCET) 10-325 MG tablet Take 1 tablet by mouth every 4 (four) hours as needed for pain.    [name] tiotropium (SPIRIVA) 18 MCG inhalation capsule Place 1 capsule (18 mcg total) into inhaler and inhale daily. 30 capsule 6  . albuterol (PROVENTIL) (2.5 MG/3ML) 0.083% nebulizer solution INHALE 3 MILLILITERS (2.5 MG) BY NEBULIZATION ROUTE EVERY 8 HOURS AS NEEDED for shortness of breath  3  . PROAIR HFA 108 (90 Base) MCG/ACT inhaler Take 2 puffs by mouth every 6 (six) hours as needed for wheezing or shortness of breath.      No current facility-administered medications for this visit.     SURGICAL HISTORY:  Past Surgical History:  Procedure Laterality Date  . APPENDECTOMY    . BACK SURGERY    . CARDIOVERSION  11/27/2011   Procedure: CARDIOVERSION;  Surgeon: Thayer Headings, MD;  Location: [facility];  Service:  Cardiovascular;  Laterality: N/A;  . LUNG CANCER SURGERY  2011    REVIEW OF SYSTEMS:  A comprehensive review of systems was negative except for: Respiratory: positive for dyspnea on exertion   PHYSICAL EXAMINATION: General appearance: alert, cooperative, appears stated age and no distress Head: Normocephalic, without obvious abnormality, atraumatic Neck: no adenopathy, no JVD, supple, symmetrical, trachea midline and thyroid not enlarged, symmetric, no tenderness/mass/nodules Lymph nodes: Cervical, supraclavicular,  and axillary nodes normal. Resp: clear to auscultation bilaterally Back: symmetric, no curvature. ROM normal. No CVA tenderness. Cardio: regular rate and rhythm, S1, S2 normal, no murmur, click, rub or gallop GI: soft, non-tender; bowel sounds normal; no masses,  no organomegaly Extremities: extremities normal, atraumatic, no cyanosis or edema  ECOG PERFORMANCE STATUS: 1 - Symptomatic but completely ambulatory  Blood pressure (!) 159/70, pulse (!) 50, temperature 98.1 F (36.7 C), temperature source Oral, resp. rate 19, height 6' (1.829 m), weight 239 lb 11.2 oz (108.7 kg), SpO2 96 %.  LABORATORY DATA: Lab Results  Component Value Date   WBC 5.5 03/02/2017   HGB 12.7 (L) 03/02/2017   HCT 38.1 (L) 03/02/2017   MCV 88.3 03/02/2017   PLT 155 03/02/2017      Chemistry      Component Value Date/Time   NA 139 08/27/2016 0854   K 4.0 08/27/2016 0854   CL 108 01/16/2016 0525   CO2 28 08/27/2016 0854   BUN 11.7 08/27/2016 0854   CREATININE 1.2 08/27/2016 0854      Component Value Date/Time   CALCIUM 9.5 08/27/2016 0854   ALKPHOS 96 08/27/2016 0854   AST 11 08/27/2016 0854   ALT 9 08/27/2016 0854   BILITOT 0.81 08/27/2016 0854       RADIOGRAPHIC STUDIES: No results found.  ASSESSMENT AND PLAN:  This is a very pleasant 66 years old white male with stage IIIa large B-cell non-Hodgkin lymphoma status post 6 cycles of systemic chemotherapy with CHOP/Rituxan with almost complete response. The patient is currently on observation and he is feeling fine except for shortness breath probably secondary to recent weight gain and COPD. CBC today is unremarkable except for mild anemia. I recommended for the patient to take over-the-counter oral iron tablets. I will see him back for follow-up visit in 6 months for evaluation with repeat blood work in addition to CT scan of the chest, abdomen and pelvis for restaging of his disease. For the hypertension and bradycardia, I recommended for the  patient to consult with his primary care physician regarding adjustment of his blood pressure medication. He was advised to call immediately if he has any concerning symptoms in the interval. The patient voices understanding of current disease status and treatment options and is in agreement with the current care plan. All questions were answered. The patient knows to call the clinic with any problems, questions or concerns. We can certainly see the patient much sooner if necessary. I spent 10 minutes counseling the patient face to face. The total time spent in the appointment was 15 minutes. Disclaimer: This note was dictated with voice recognition software. Similar sounding words can inadvertently be transcribed and may not be corrected upon review.

## 2017-03-11 DIAGNOSIS — J449 Chronic obstructive pulmonary disease, unspecified: Secondary | ICD-10-CM | POA: Diagnosis not present

## 2017-03-11 DIAGNOSIS — I10 Essential (primary) hypertension: Secondary | ICD-10-CM | POA: Diagnosis not present

## 2017-03-11 DIAGNOSIS — M5136 Other intervertebral disc degeneration, lumbar region: Secondary | ICD-10-CM | POA: Diagnosis not present

## 2017-03-25 DIAGNOSIS — J449 Chronic obstructive pulmonary disease, unspecified: Secondary | ICD-10-CM | POA: Diagnosis not present

## 2017-04-13 ENCOUNTER — Ambulatory Visit (HOSPITAL_BASED_OUTPATIENT_CLINIC_OR_DEPARTMENT_OTHER): Payer: PPO

## 2017-04-13 VITALS — BP 155/74 | HR 69 | Temp 97.8°F | Resp 20

## 2017-04-13 DIAGNOSIS — Z95828 Presence of other vascular implants and grafts: Secondary | ICD-10-CM

## 2017-04-13 DIAGNOSIS — C8338 Diffuse large B-cell lymphoma, lymph nodes of multiple sites: Secondary | ICD-10-CM | POA: Diagnosis not present

## 2017-04-13 DIAGNOSIS — Z452 Encounter for adjustment and management of vascular access device: Secondary | ICD-10-CM | POA: Diagnosis not present

## 2017-04-13 MED ORDER — HEPARIN SOD (PORK) LOCK FLUSH 100 UNIT/ML IV SOLN
500.0000 [IU] | Freq: Once | INTRAVENOUS | Status: AC | PRN
  Administered 2017-04-13: 500 [IU] via INTRAVENOUS
  Filled 2017-04-13: qty 5

## 2017-04-13 MED ORDER — SODIUM CHLORIDE 0.9% FLUSH
10.0000 mL | INTRAVENOUS | Status: DC | PRN
  Administered 2017-04-13: 10 mL via INTRAVENOUS
  Filled 2017-04-13: qty 10

## 2017-04-13 NOTE — Patient Instructions (Signed)

## 2017-04-25 DIAGNOSIS — J449 Chronic obstructive pulmonary disease, unspecified: Secondary | ICD-10-CM | POA: Diagnosis not present

## 2017-05-25 ENCOUNTER — Telehealth: Payer: Self-pay | Admitting: *Deleted

## 2017-05-25 ENCOUNTER — Ambulatory Visit (HOSPITAL_BASED_OUTPATIENT_CLINIC_OR_DEPARTMENT_OTHER): Payer: PPO

## 2017-05-25 VITALS — BP 156/68 | HR 51 | Temp 97.9°F | Resp 18

## 2017-05-25 DIAGNOSIS — C8338 Diffuse large B-cell lymphoma, lymph nodes of multiple sites: Secondary | ICD-10-CM

## 2017-05-25 DIAGNOSIS — Z95828 Presence of other vascular implants and grafts: Secondary | ICD-10-CM

## 2017-05-25 DIAGNOSIS — Z452 Encounter for adjustment and management of vascular access device: Secondary | ICD-10-CM | POA: Diagnosis not present

## 2017-05-25 MED ORDER — HEPARIN SOD (PORK) LOCK FLUSH 100 UNIT/ML IV SOLN
500.0000 [IU] | Freq: Once | INTRAVENOUS | Status: AC | PRN
  Administered 2017-05-25: 500 [IU] via INTRAVENOUS
  Filled 2017-05-25: qty 5

## 2017-05-25 MED ORDER — SODIUM CHLORIDE 0.9% FLUSH
10.0000 mL | INTRAVENOUS | Status: DC | PRN
  Administered 2017-05-25: 10 mL via INTRAVENOUS
  Filled 2017-05-25: qty 10

## 2017-05-25 NOTE — Telephone Encounter (Signed)
"  This is [name], my husband was there for flush.  It has started bleeding quite a bit.  Cal him at [phone]." "I was there for a flush today.  Some bleeding afterwards.  Appreciate call [phone] to determine if this is normal." Returned call.  "The Band-Aid is saturated. I have a dot of blood the size of a pencil eraser on my shirt.  Never had this happen before, bled for five to ten minutes."  Denies use of anti-coagulants, blood thinners or NSAID's. Communicated bleeding can happen.  Feel free to ask nurse to apply gauze pressure dressing as a precaution.  With any bleeding anywhere in the future, apply pressure to port or other area for ten minutes to subside.  If bleeding does not stop call office or return if during office hours.  Denies any further questions or concerns at this time.

## 2017-05-25 NOTE — Patient Instructions (Signed)

## 2017-05-26 DIAGNOSIS — J449 Chronic obstructive pulmonary disease, unspecified: Secondary | ICD-10-CM | POA: Diagnosis not present

## 2017-06-08 ENCOUNTER — Telehealth: Payer: Self-pay | Admitting: Internal Medicine

## 2017-06-08 NOTE — Telephone Encounter (Signed)
Spoke with [name] and she states the pt uses oxygen at night. [employer] is not on insurance formulary anymore and [name] needs to switch DME company. I made him appt with TP on 9/51 to recertify and she states [name] will need another test (ONO) for insurance to continue oxygen. Nothing further is needed.

## 2017-06-10 ENCOUNTER — Ambulatory Visit (INDEPENDENT_AMBULATORY_CARE_PROVIDER_SITE_OTHER): Payer: PPO | Admitting: Adult Health

## 2017-06-10 ENCOUNTER — Encounter: Payer: Self-pay | Admitting: Adult Health

## 2017-06-10 DIAGNOSIS — J449 Chronic obstructive pulmonary disease, unspecified: Secondary | ICD-10-CM

## 2017-06-10 DIAGNOSIS — J9611 Chronic respiratory failure with hypoxia: Secondary | ICD-10-CM

## 2017-06-10 DIAGNOSIS — Z23 Encounter for immunization: Secondary | ICD-10-CM

## 2017-06-10 NOTE — Progress Notes (Signed)
@Patient  ID: [name], male    DOB: October 29, 1950, 66 y.o.   MRN: [id number]  Chief Complaint  Patient presents with  . Follow-up    COPD    Referring provider: [name], MD  HPI: 66 year old male former smoker followed for gold 3 COPD and O2 RF  TEST  Follow-up Gold stage III COPD. FEV1 1.66 L/45% post-dilator in March 2017 and DLCO 13.96/39% Large B cell non Hodgkin lymphoma dx 2016 f/by Oncology   06/10/2017 Follow up : COPD , O2 RF  Patient returns for follow-up. Patient was last seen August 2017. Patient has known COPD. He is on Spiriva but importantly he has been out of it for few months. His insurance is in Coverage right now. Patient is on oxygen at bedtime. He is changing insurances and needs a new qualifying oxygen at bedtime. Patient does use oxygen occasionally during the daytime. O2 saturations walking did not drop below 92% on room air. Says overall breathing is doing okay. He gets winded if he walks for prolonged period of time. He denies any flare cough or wheezing. Works [employer] , drives truck .  On ACE inibitor . Denies cough .    No Known Allergies  Immunization History  Administered Date(s) Administered  . Influenza Whole 06/21/2009, 08/30/2011  . Influenza-Unspecified 09/16/2015, 05/13/2016  . Pneumococcal Polysaccharide-23 05/22/2008  . Tdap 12/03/2011    Past Medical History:  Diagnosis Date  . BPH (benign prostatic hyperplasia) 2011  . Congestive heart failure ([place])   . COPD (chronic obstructive pulmonary disease) ([place])   . Depression   . Drug-induced neutropenia ([place]) 10/08/2015  . Encounter for antineoplastic chemotherapy 10/08/2015  . Gastrointestinal obstruction (HCC)    Ileus  . Gout   . Hypertension   . Hypothyroid   . Lung cancer ([place])   . NHL (non-Hodgkin's lymphoma) ([place]) 09/25/2015  . Obesity   . Polycythemia   . Pulmonary hypertension ([place])   . Sepsis ([place]) 01/14/2016  . Streptococcal pneumonia [facility]) August 2009     Tobacco History: History  Smoking Status  . Former Smoker  . Packs/day: 3.00  . Years: 43.00  . Types: Cigarettes  . Quit date: 10/22/2001  Smokeless Tobacco  . Never Used   Counseling given: Not Answered   Outpatient Encounter Prescriptions as of 06/10/2017  Medication Sig  . albuterol (PROVENTIL) (2.5 MG/3ML) 0.083% nebulizer solution INHALE 3 MILLILITERS (2.5 MG) BY NEBULIZATION ROUTE EVERY 8 HOURS AS NEEDED for shortness of breath  . allopurinol (ZYLOPRIM) 300 MG tablet Take 300 mg by mouth daily.  [name] amLODipine (NORVASC) 5 MG tablet Take 1 tablet by mouth Daily.  . betamethasone dipropionate 0.05 % lotion Apply 1 application topically 2 (two) times daily as needed.  . finasteride (PROSCAR) 5 MG tablet Take 5 mg by mouth daily.  [name] levothyroxine (SYNTHROID, LEVOTHROID) 150 MCG tablet Take 150 mcg by mouth daily.  [name] lidocaine-prilocaine (EMLA) cream Apply 1 application topically as needed.  [name] lisinopril (PRINIVIL,ZESTRIL) 10 MG tablet Take 1 tablet (10 mg total) by mouth daily.  [name] omeprazole (PRILOSEC) 20 MG capsule Take 1 tablet by mouth Twice daily.  [name] oxyCODONE-acetaminophen (PERCOCET) 10-325 MG tablet Take 1 tablet by mouth every 4 (four) hours as needed for pain.  [name] PROAIR HFA 108 (90 Base) MCG/ACT inhaler Take 2 puffs by mouth every 6 (six) hours as needed for wheezing or shortness of breath.   . tiotropium (SPIRIVA) 18 MCG inhalation capsule Place 1 capsule (18 mcg total)  into inhaler and inhale daily.  [name] ALPRAZolam (XANAX) 1 MG tablet Take 1 mg by mouth at bedtime as needed for sleep.   No facility-administered encounter medications on file as of 06/10/2017.      Review of Systems  Constitutional:   No  weight loss, night sweats,  Fevers, chills, + fatigue, or  lassitude.  HEENT:   No headaches,  Difficulty swallowing,  Tooth/dental problems, or  Sore throat,                No sneezing, itching, ear ache, nasal congestion, post nasal drip,   CV:  No chest pain,   Orthopnea, PND, swelling in lower extremities, anasarca, dizziness, palpitations, syncope.   GI  No heartburn, indigestion, abdominal pain, nausea, vomiting, diarrhea, change in bowel habits, loss of appetite, bloody stools.   Resp:  .  No chest wall deformity  Skin: no rash or lesions.  GU: no dysuria, change in color of urine, no urgency or frequency.  No flank pain, no hematuria   MS:  No joint pain or swelling.  No decreased range of motion.  No back pain.    Physical Exam  BP 130/80 (BP Location: Left Arm, Cuff Size: Normal)   Pulse 85   Ht 6' (1.829 m)   Wt 236 lb 3.2 oz (107.1 kg)   SpO2 94%   BMI 32.03 kg/m   GEN: A/Ox3; pleasant , NAD, elderly    HEENT:  [place]/AT,  EACs-clear, TMs-wnl, NOSE-clear, THROAT-clear, no lesions, no  postnasal drip or exudate noted.   NECK:  Supple w/ fair ROM; no JVD; normal carotid impulses w/o bruits; no thyromegaly or nodules palpated; no lymphadenopathy.    RESP  Clear  P & A; w/o, wheezes/ rales/ or rhonchi. no accessory muscle use, no dullness to percussion  CARD:  RRR, no m/r/g, no peripheral edema, pulses intact, no cyanosis or clubbing.  GI:   Soft & nt; nml bowel sounds; no organomegaly or masses detected.   Musco: Warm bil, no deformities or joint swelling noted.   Neuro: alert, no focal deficits noted.    Skin: Warm, no lesions or rashes    Lab Results:  CBC  BMET Imaging: No results found.   Assessment & Plan:   COPD (chronic obstructive pulmonary disease) (HCC) Severe COPD - compensated without flare   Plan  Patient Instructions  Restart Spiriva daily  Flu shot today .  Continue on oxygen 2l/m At bedtime   Set up ONO At bedtime .  Follow up with Dr. Chase Caller 6 months and As needed       Chronic respiratory failure with hypoxia ([place]) Cont on O2  ONO per DME/insurance      Rexene Edison, NP 06/10/2017

## 2017-06-10 NOTE — Patient Instructions (Addendum)
Restart Spiriva daily  Flu shot today .  Continue on oxygen 2l/m At bedtime   Set up ONO At bedtime .  Follow up with Dr. Chase Caller 6 months and As needed

## 2017-06-10 NOTE — Assessment & Plan Note (Signed)
Severe COPD - compensated without flare   Plan  Patient Instructions  Restart Spiriva daily  Flu shot today .  Continue on oxygen 2l/m At bedtime   Set up ONO At bedtime .  Follow up with Dr. Chase Caller 6 months and As needed

## 2017-06-10 NOTE — Assessment & Plan Note (Signed)
Cont on O2  ONO per DME/insurance

## 2017-06-10 NOTE — Addendum Note (Signed)
Addended by: Parke Poisson E on: 06/10/2017 03:49 PM   Modules accepted: Orders

## 2017-06-15 ENCOUNTER — Telehealth: Payer: Self-pay | Admitting: Adult Health

## 2017-06-15 DIAGNOSIS — G8929 Other chronic pain: Secondary | ICD-10-CM

## 2017-06-15 DIAGNOSIS — M5440 Lumbago with sciatica, unspecified side: Principal | ICD-10-CM

## 2017-06-15 DIAGNOSIS — J449 Chronic obstructive pulmonary disease, unspecified: Secondary | ICD-10-CM

## 2017-06-15 DIAGNOSIS — M159 Polyosteoarthritis, unspecified: Secondary | ICD-10-CM

## 2017-06-15 DIAGNOSIS — R0600 Dyspnea, unspecified: Secondary | ICD-10-CM

## 2017-06-15 NOTE — Telephone Encounter (Signed)
Patient requested to speak to TP directly. Will route to TP.

## 2017-06-15 NOTE — Telephone Encounter (Signed)
Will close this message since another message has been opened on this patient.

## 2017-06-15 NOTE — Telephone Encounter (Signed)
Refer to pain clinic  . Chronic back pain and joint pain from DJD .  Has severe COPD , on O2 . With chronic dyspnea.  Changing PCP and needs referral sent .  Wife Will call back with name and number of pain clinic .

## 2017-06-15 NOTE — Telephone Encounter (Signed)
Pt wife returning call a/b husband's referral, please advise, she can be reached @ [phone].Hillery Hunter

## 2017-06-15 NOTE — Telephone Encounter (Signed)
Left message to get more information about referral.

## 2017-06-16 NOTE — Telephone Encounter (Signed)
Patient's wife returned call. States pain clinic is [facility], fax # [phone]. If need to reach patient's wife, Sheppard Penton is [phone] today (home).

## 2017-06-16 NOTE — Telephone Encounter (Signed)
Will go ahead and place the orders for pain clinic and primary care.

## 2017-06-25 DIAGNOSIS — J449 Chronic obstructive pulmonary disease, unspecified: Secondary | ICD-10-CM | POA: Diagnosis not present

## 2017-06-30 ENCOUNTER — Encounter: Payer: Self-pay | Admitting: Adult Health

## 2017-06-30 DIAGNOSIS — J449 Chronic obstructive pulmonary disease, unspecified: Secondary | ICD-10-CM | POA: Diagnosis not present

## 2017-06-30 DIAGNOSIS — R0902 Hypoxemia: Secondary | ICD-10-CM | POA: Diagnosis not present

## 2017-07-01 DIAGNOSIS — E7800 Pure hypercholesterolemia, unspecified: Secondary | ICD-10-CM | POA: Diagnosis not present

## 2017-07-01 DIAGNOSIS — J449 Chronic obstructive pulmonary disease, unspecified: Secondary | ICD-10-CM | POA: Diagnosis not present

## 2017-07-01 DIAGNOSIS — E039 Hypothyroidism, unspecified: Secondary | ICD-10-CM | POA: Diagnosis not present

## 2017-07-01 DIAGNOSIS — Z79899 Other long term (current) drug therapy: Secondary | ICD-10-CM | POA: Diagnosis not present

## 2017-07-01 DIAGNOSIS — Z125 Encounter for screening for malignant neoplasm of prostate: Secondary | ICD-10-CM | POA: Diagnosis not present

## 2017-07-01 DIAGNOSIS — I10 Essential (primary) hypertension: Secondary | ICD-10-CM | POA: Diagnosis not present

## 2017-07-06 ENCOUNTER — Ambulatory Visit (HOSPITAL_BASED_OUTPATIENT_CLINIC_OR_DEPARTMENT_OTHER): Payer: PPO

## 2017-07-06 DIAGNOSIS — Z452 Encounter for adjustment and management of vascular access device: Secondary | ICD-10-CM | POA: Diagnosis not present

## 2017-07-06 DIAGNOSIS — C8338 Diffuse large B-cell lymphoma, lymph nodes of multiple sites: Secondary | ICD-10-CM | POA: Diagnosis not present

## 2017-07-06 DIAGNOSIS — Z95828 Presence of other vascular implants and grafts: Secondary | ICD-10-CM

## 2017-07-06 MED ORDER — HEPARIN SOD (PORK) LOCK FLUSH 100 UNIT/ML IV SOLN
500.0000 [IU] | Freq: Once | INTRAVENOUS | Status: AC | PRN
  Administered 2017-07-06: 500 [IU] via INTRAVENOUS
  Filled 2017-07-06: qty 5

## 2017-07-06 MED ORDER — SODIUM CHLORIDE 0.9% FLUSH
10.0000 mL | INTRAVENOUS | Status: DC | PRN
  Administered 2017-07-06: 10 mL via INTRAVENOUS
  Filled 2017-07-06: qty 10

## 2017-07-08 ENCOUNTER — Telehealth: Payer: Self-pay | Admitting: Adult Health

## 2017-07-08 DIAGNOSIS — J449 Chronic obstructive pulmonary disease, unspecified: Secondary | ICD-10-CM

## 2017-07-08 NOTE — Telephone Encounter (Signed)
Patient seen by TP on 9.20.18 for face-to-face and requalify for O2 2lpm at bedtime 10.11.18 ONO on room air results received and reviewed by TP:  Continue O2 at 2lpm at bedtime  Called spoke with patient, discussed ONO results/recs as stated by TP Pt voiced his understanding Order sent to [facility] to continue nocturnal O2 ONO sent for scan  Nothing further needed; will sign off

## 2017-07-15 DIAGNOSIS — I10 Essential (primary) hypertension: Secondary | ICD-10-CM | POA: Diagnosis not present

## 2017-07-15 DIAGNOSIS — G8929 Other chronic pain: Secondary | ICD-10-CM | POA: Diagnosis not present

## 2017-07-15 DIAGNOSIS — R0789 Other chest pain: Secondary | ICD-10-CM | POA: Diagnosis not present

## 2017-07-15 DIAGNOSIS — M545 Low back pain: Secondary | ICD-10-CM | POA: Diagnosis not present

## 2017-07-26 DIAGNOSIS — J449 Chronic obstructive pulmonary disease, unspecified: Secondary | ICD-10-CM | POA: Diagnosis not present

## 2017-07-27 DIAGNOSIS — J449 Chronic obstructive pulmonary disease, unspecified: Secondary | ICD-10-CM | POA: Diagnosis not present

## 2017-08-03 ENCOUNTER — Telehealth: Payer: Self-pay | Admitting: Adult Health

## 2017-08-03 NOTE — Telephone Encounter (Signed)
ATC pt, no answer. Left message for pt to call back.   Last sats were in 2016

## 2017-08-04 NOTE — Telephone Encounter (Signed)
Called wife to let her know, appt made for 11/15. Nothing further is needed.

## 2017-08-04 NOTE — Telephone Encounter (Signed)
Spoke with [facility] and they said pt needs a office visit and walk text because it has to be within 30 days and his last OV was in September.

## 2017-08-04 NOTE — Telephone Encounter (Signed)
Pt's wife called back and gave this number to call her at.  [phone]

## 2017-08-06 ENCOUNTER — Encounter: Payer: Self-pay | Admitting: Adult Health

## 2017-08-06 ENCOUNTER — Ambulatory Visit: Payer: PPO | Admitting: Adult Health

## 2017-08-06 VITALS — BP 130/66 | HR 85 | Ht 72.0 in | Wt 236.2 lb

## 2017-08-06 DIAGNOSIS — J449 Chronic obstructive pulmonary disease, unspecified: Secondary | ICD-10-CM

## 2017-08-06 DIAGNOSIS — J9611 Chronic respiratory failure with hypoxia: Secondary | ICD-10-CM

## 2017-08-06 DIAGNOSIS — Z23 Encounter for immunization: Secondary | ICD-10-CM | POA: Diagnosis not present

## 2017-08-06 MED ORDER — PROAIR HFA 108 (90 BASE) MCG/ACT IN AERS: 2.0000 | INHALATION_SPRAY | Freq: Four times a day (QID) | RESPIRATORY_TRACT | 1 refills | Status: DC | PRN

## 2017-08-06 MED ORDER — TIOTROPIUM BROMIDE MONOHYDRATE 18 MCG IN CAPS: 18.0000 ug | ORAL_CAPSULE | Freq: Every day | RESPIRATORY_TRACT | 1 refills | Status: DC

## 2017-08-06 NOTE — Addendum Note (Signed)
Addended by: Parke Poisson E on: 08/06/2017 11:11 AM   Modules accepted: Orders

## 2017-08-06 NOTE — Patient Instructions (Addendum)
Restart Spiriva 1 puff daily .  Continue on oxygen 2l/m with activity and At bedtime  .  Prevnar vaccine .  Follow up with Dr. Chase Caller 4 months and As needed

## 2017-08-06 NOTE — Assessment & Plan Note (Signed)
Cont on O2 with act and At bedtime   

## 2017-08-06 NOTE — Assessment & Plan Note (Signed)
Controlled on rx  Refills on Sprivia  prevnar vaccine   Plan  Patient Instructions  Restart Spiriva 1 puff daily .  Continue on oxygen 2l/m with activity and At bedtime  .  Prevnar vaccine .  Follow up with Dr. Chase Caller 4 months and As needed

## 2017-08-06 NOTE — Progress Notes (Signed)
@Patient  ID: [name], male    DOB: 03/20/1951, 66 y.o.   MRN: [id number]  Chief Complaint  Patient presents with  . Follow-up    COPD     Referring provider: [name], MD  HPI: 66 year old male former smoker followed for gold 3 COPD and O2 RF  Squamous cell Lung Cancer 2011 -s/p LUL resection   TEST  Follow-up Gold stage III COPD. FEV1 1.66 L/45% post-dilator in March 2017 and DLCO 13.96/39% Large B cell non Hodgkin lymphoma dx 2016 f/by Oncology   08/06/2017 Follow up ; COPD /O2 RF  Pt returns for follow up. He says breathing is doing okay with no flare of cough or wheezing  Says he was sick with bronchitis 1 month ago, took abx, and sx resolved.  Has not started back on Sprivia , needs refills sent to pharmacy .  On ACE inhibitor . Denies flare of cough .  Still working full time.   Pt is changing insurances and needs reevaluation for POC O2 device and qualification walk.  O2 sats at room air 94%, walking on Room air O2 sats 85%, on 2l/m O2 93%.   Has follow up with Dr Julien Nordmann in Dec with follow up CT . Hx of Lymphoma.   PVX and FLu vaccine utd.  Discussed Prevnar 13  Vaccine , pt education .   No Known Allergies  Immunization History  Administered Date(s) Administered  . Influenza Whole 06/21/2009, 08/30/2011  . Influenza, High Dose Seasonal PF 06/10/2017  . Influenza-Unspecified 09/16/2015, 05/13/2016  . Pneumococcal Polysaccharide-23 05/22/2008  . Tdap 12/03/2011    Past Medical History:  Diagnosis Date  . BPH (benign prostatic hyperplasia) 2011  . Congestive heart failure ([place])   . COPD (chronic obstructive pulmonary disease) ([place])   . Depression   . Drug-induced neutropenia ([place]) 10/08/2015  . Encounter for antineoplastic chemotherapy 10/08/2015  . Gastrointestinal obstruction (HCC)    Ileus  . Gout   . Hypertension   . Hypothyroid   . Lung cancer ([place])   . NHL (non-Hodgkin's lymphoma) ([place]) 09/25/2015  . Obesity   . Polycythemia   .  Pulmonary hypertension ([place])   . Sepsis ([place]) 01/14/2016  . Streptococcal pneumonia [facility]) August 2009    Tobacco History: Social History   Tobacco Use  Smoking Status Former Smoker  . Packs/day: 3.00  . Years: 43.00  . Pack years: 129.00  . Types: Cigarettes  . Last attempt to quit: 10/22/2001  . Years since quitting: 15.8  Smokeless Tobacco Never Used   Counseling given: Not Answered   Outpatient Encounter Medications as of 08/06/2017  Medication Sig  . albuterol (PROVENTIL) (2.5 MG/3ML) 0.083% nebulizer solution INHALE 3 MILLILITERS (2.5 MG) BY NEBULIZATION ROUTE EVERY 8 HOURS AS NEEDED for shortness of breath  . allopurinol (ZYLOPRIM) 300 MG tablet Take 300 mg by mouth daily.  [name] ALPRAZolam (XANAX) 1 MG tablet Take 1 mg by mouth at bedtime as needed for sleep.  [name] amLODipine (NORVASC) 5 MG tablet Take 1 tablet by mouth Daily.  . betamethasone dipropionate 0.05 % lotion Apply 1 application topically 2 (two) times daily as needed.  . finasteride (PROSCAR) 5 MG tablet Take 5 mg by mouth daily.  [name] levothyroxine (SYNTHROID, LEVOTHROID) 150 MCG tablet Take 150 mcg by mouth daily.  [name] lidocaine-prilocaine (EMLA) cream Apply 1 application topically as needed.  [name] lisinopril (PRINIVIL,ZESTRIL) 10 MG tablet Take 1 tablet (10 mg total) by mouth daily.  [name] omeprazole (PRILOSEC) 20 MG  capsule Take 1 tablet by mouth Twice daily.  [name] oxyCODONE-acetaminophen (PERCOCET) 10-325 MG tablet Take 1 tablet by mouth every 4 (four) hours as needed for pain.  [name] PROAIR HFA 108 (90 Base) MCG/ACT inhaler Take 2 puffs by mouth every 6 (six) hours as needed for wheezing or shortness of breath.   . tiotropium (SPIRIVA) 18 MCG inhalation capsule Place 1 capsule (18 mcg total) into inhaler and inhale daily.   No facility-administered encounter medications on file as of 08/06/2017.      Review of Systems  Constitutional:   No  weight loss, night sweats,  Fevers, chills, + fatigue, or  lassitude.  HEENT:   No  headaches,  Difficulty swallowing,  Tooth/dental problems, or  Sore throat,                No sneezing, itching, ear ache, nasal congestion, post nasal drip,   CV:  No chest pain,  Orthopnea, PND, swelling in lower extremities, anasarca, dizziness, palpitations, syncope.   GI  No heartburn, indigestion, abdominal pain, nausea, vomiting, diarrhea, change in bowel habits, loss of appetite, bloody stools.   Resp:    No chest wall deformity  Skin: no rash or lesions.  GU: no dysuria, change in color of urine, no urgency or frequency.  No flank pain, no hematuria   MS:  No joint pain or swelling.  No decreased range of motion.  No back pain.    Physical Exam  BP 130/66 (BP Location: Left Arm, Cuff Size: Normal)   Pulse 85   Ht 6' (1.829 m)   Wt 236 lb 3.2 oz (107.1 kg)   SpO2 94%   BMI 32.03 kg/m   GEN: A/Ox3; pleasant , NAD, elderly    HEENT:  [place]/AT,  EACs-clear, TMs-wnl, NOSE-clear, THROAT-clear, no lesions, no postnasal drip or exudate noted.   NECK:  Supple w/ fair ROM; no JVD; normal carotid impulses w/o bruits; no thyromegaly or nodules palpated; no lymphadenopathy.    RESP  Decreased BS in bases ,  no accessory muscle use, no dullness to percussion  CARD:  RRR, no m/r/g, no peripheral edema, pulses intact, no cyanosis or clubbing.  GI:   Soft & nt; nml bowel sounds; no organomegaly or masses detected.   Musco: Warm bil, no deformities or joint swelling noted.   Neuro: alert, no focal deficits noted.    Skin: Warm, no lesions or rashes    Lab Results:  BMET  Imaging: No results found.   Assessment & Plan:   COPD (chronic obstructive pulmonary disease) ([place]) Controlled on rx  Refills on Sprivia  prevnar vaccine   Plan  Patient Instructions  Restart Spiriva 1 puff daily .  Continue on oxygen 2l/m with activity and At bedtime  .  Prevnar vaccine .  Follow up with Dr. Chase Caller 4 months and As needed       Chronic respiratory failure with hypoxia  ([place]) Cont on O2 with act and At bedtime       Rexene Edison, NP 08/06/2017

## 2017-08-11 DIAGNOSIS — E7800 Pure hypercholesterolemia, unspecified: Secondary | ICD-10-CM | POA: Diagnosis not present

## 2017-08-11 DIAGNOSIS — Z79899 Other long term (current) drug therapy: Secondary | ICD-10-CM | POA: Diagnosis not present

## 2017-08-25 DIAGNOSIS — J449 Chronic obstructive pulmonary disease, unspecified: Secondary | ICD-10-CM | POA: Diagnosis not present

## 2017-08-26 DIAGNOSIS — J449 Chronic obstructive pulmonary disease, unspecified: Secondary | ICD-10-CM | POA: Diagnosis not present

## 2017-08-29 ENCOUNTER — Telehealth: Payer: Self-pay | Admitting: *Deleted

## 2017-08-29 NOTE — Telephone Encounter (Signed)
Attempted to call patient twice to let patient know that [place] is closed tomorrow 08/30/2017. No one answered phone and no voicemail picked up to leave a message.

## 2017-08-30 ENCOUNTER — Other Ambulatory Visit: Payer: PPO

## 2017-08-30 ENCOUNTER — Ambulatory Visit (HOSPITAL_COMMUNITY): Admission: RE | Admit: 2017-08-30 | Payer: PPO | Source: Ambulatory Visit

## 2017-09-01 ENCOUNTER — Other Ambulatory Visit: Payer: PPO

## 2017-09-01 ENCOUNTER — Ambulatory Visit: Payer: PPO | Admitting: Internal Medicine

## 2017-09-07 ENCOUNTER — Ambulatory Visit: Payer: PPO

## 2017-09-07 ENCOUNTER — Ambulatory Visit (HOSPITAL_COMMUNITY)
Admission: RE | Admit: 2017-09-07 | Discharge: 2017-09-07 | Disposition: A | Discharge status: Home / Self Care | Payer: PPO | Source: Ambulatory Visit | Attending: Internal Medicine | Admitting: Internal Medicine

## 2017-09-07 ENCOUNTER — Other Ambulatory Visit (HOSPITAL_BASED_OUTPATIENT_CLINIC_OR_DEPARTMENT_OTHER): Payer: PPO

## 2017-09-07 DIAGNOSIS — J9811 Atelectasis: Secondary | ICD-10-CM | POA: Diagnosis not present

## 2017-09-07 DIAGNOSIS — N200 Calculus of kidney: Secondary | ICD-10-CM | POA: Insufficient documentation

## 2017-09-07 DIAGNOSIS — D739 Disease of spleen, unspecified: Secondary | ICD-10-CM | POA: Insufficient documentation

## 2017-09-07 DIAGNOSIS — K5730 Diverticulosis of large intestine without perforation or abscess without bleeding: Secondary | ICD-10-CM | POA: Insufficient documentation

## 2017-09-07 DIAGNOSIS — N281 Cyst of kidney, acquired: Secondary | ICD-10-CM | POA: Diagnosis not present

## 2017-09-07 DIAGNOSIS — I700 Atherosclerosis of aorta: Secondary | ICD-10-CM | POA: Insufficient documentation

## 2017-09-07 DIAGNOSIS — C8258 Diffuse follicle center lymphoma, lymph nodes of multiple sites: Secondary | ICD-10-CM | POA: Diagnosis not present

## 2017-09-07 DIAGNOSIS — C8338 Diffuse large B-cell lymphoma, lymph nodes of multiple sites: Secondary | ICD-10-CM | POA: Diagnosis not present

## 2017-09-07 DIAGNOSIS — Z95828 Presence of other vascular implants and grafts: Secondary | ICD-10-CM

## 2017-09-07 LAB — CBC WITH DIFFERENTIAL/PLATELET
BASO%: 0.3 % (ref 0.0–2.0)
BASOS ABS: 0 10*3/uL (ref 0.0–0.1)
EOS%: 3.4 % (ref 0.0–7.0)
Eosinophils Absolute: 0.3 10*3/uL (ref 0.0–0.5)
HCT: 40.7 % (ref 38.4–49.9)
HEMOGLOBIN: 12.9 g/dL — AB (ref 13.0–17.1)
LYMPH%: 21.3 % (ref 14.0–49.0)
MCH: 29.3 pg (ref 27.2–33.4)
MCHC: 31.7 g/dL — ABNORMAL LOW (ref 32.0–36.0)
MCV: 92.3 fL (ref 79.3–98.0)
MONO#: 0.6 10*3/uL (ref 0.1–0.9)
MONO%: 7.7 % (ref 0.0–14.0)
NEUT#: 5.3 10*3/uL (ref 1.5–6.5)
NEUT%: 67.3 % (ref 39.0–75.0)
Platelets: 160 10*3/uL (ref 140–400)
RBC: 4.41 10*6/uL (ref 4.20–5.82)
RDW: 13.8 % (ref 11.0–14.6)
WBC: 7.8 10*3/uL (ref 4.0–10.3)
lymph#: 1.7 10*3/uL (ref 0.9–3.3)

## 2017-09-07 LAB — COMPREHENSIVE METABOLIC PANEL
ALBUMIN: 3.9 g/dL (ref 3.5–5.0)
ALT: 8 U/L (ref 0–55)
AST: 11 U/L (ref 5–34)
Alkaline Phosphatase: 92 U/L (ref 40–150)
Anion Gap: 6 mEq/L (ref 3–11)
BUN: 13.6 mg/dL (ref 7.0–26.0)
CHLORIDE: 101 meq/L (ref 98–109)
CO2: 33 mEq/L — ABNORMAL HIGH (ref 22–29)
Calcium: 9.1 mg/dL (ref 8.4–10.4)
Creatinine: 1 mg/dL (ref 0.7–1.3)
GLUCOSE: 87 mg/dL (ref 70–140)
SODIUM: 140 meq/L (ref 136–145)
Total Bilirubin: 0.47 mg/dL (ref 0.20–1.20)
Total Protein: 6.6 g/dL (ref 6.4–8.3)

## 2017-09-07 LAB — LACTATE DEHYDROGENASE: LDH: 110 U/L — ABNORMAL LOW (ref 125–245)

## 2017-09-07 MED ORDER — IOPAMIDOL (ISOVUE-300) INJECTION 61%
INTRAVENOUS | Status: AC
  Administered 2017-09-07: 100 mL via INTRAVENOUS
  Filled 2017-09-07: qty 100

## 2017-09-07 MED ORDER — SODIUM CHLORIDE 0.9% FLUSH
10.0000 mL | INTRAVENOUS | Status: DC | PRN
  Administered 2017-09-07: 10 mL via INTRAVENOUS
  Filled 2017-09-07: qty 10

## 2017-09-08 DIAGNOSIS — M545 Low back pain: Secondary | ICD-10-CM | POA: Diagnosis not present

## 2017-09-08 DIAGNOSIS — Z79899 Other long term (current) drug therapy: Secondary | ICD-10-CM | POA: Diagnosis not present

## 2017-09-08 DIAGNOSIS — E039 Hypothyroidism, unspecified: Secondary | ICD-10-CM | POA: Diagnosis not present

## 2017-09-08 DIAGNOSIS — G8929 Other chronic pain: Secondary | ICD-10-CM | POA: Diagnosis not present

## 2017-09-09 ENCOUNTER — Telehealth: Payer: Self-pay | Admitting: Internal Medicine

## 2017-09-09 ENCOUNTER — Encounter: Payer: Self-pay | Admitting: Oncology

## 2017-09-09 ENCOUNTER — Ambulatory Visit (HOSPITAL_BASED_OUTPATIENT_CLINIC_OR_DEPARTMENT_OTHER): Payer: PPO | Admitting: Oncology

## 2017-09-09 VITALS — BP 145/67 | HR 60 | Temp 98.6°F | Resp 17 | Ht 72.0 in | Wt 239.0 lb

## 2017-09-09 DIAGNOSIS — R0609 Other forms of dyspnea: Secondary | ICD-10-CM | POA: Diagnosis not present

## 2017-09-09 DIAGNOSIS — C8338 Diffuse large B-cell lymphoma, lymph nodes of multiple sites: Secondary | ICD-10-CM

## 2017-09-09 DIAGNOSIS — C8332 Diffuse large B-cell lymphoma, intrathoracic lymph nodes: Secondary | ICD-10-CM

## 2017-09-09 DIAGNOSIS — C8258 Diffuse follicle center lymphoma, lymph nodes of multiple sites: Secondary | ICD-10-CM

## 2017-09-09 NOTE — Assessment & Plan Note (Signed)
This is a very pleasant 66 year old white male with stage IIIa large B-cell non-Hodgkin lymphoma status post 6 cycles of systemic chemotherapy with CHOP/Rituxan with almost complete response. The patient is currently on observation and he is feeling fine except for shortness breath probably secondary to recent weight gain and COPD.  The patient was seen with Dr. Julien Nordmann.  CT scan results were discussed with the patient and his wife.  Explained to them that his CT scan is stable.  Recommend continued observation.  Follow-up visit will be in 6 months for evaluation with repeat blood work.  He was advised to call immediately if he has any concerning symptoms in the interval. The patient voices understanding of current disease status and treatment options and is in agreement with the current care plan. All questions were answered. The patient knows to call the clinic with any problems, questions or concerns. We can certainly see the patient much sooner if necessary.

## 2017-09-09 NOTE — Telephone Encounter (Signed)
Gave avs and calendar for June 2019  °

## 2017-09-09 NOTE — Progress Notes (Signed)
[facility] OFFICE PROGRESS NOTE  Jose Lento, PA-C [address] [place] [address]  DIAGNOSIS: Stage III large B-cell non-Hodgkin lymphoma diagnosed in December 2016 and presented with extensive lymphadenopathy involving the neck, chest, abdomen as well as splenomegaly.   PRIOR THERAPY: Systemic chemotherapy with the CHOP/Rituxan every 3 weeks with Neulasta support. Status post 6 cycles.  CURRENT THERAPY: Observation.  INTERVAL HISTORY: [name] 66 y.o. male returns to the clinic today for six-month follow-up visit. The patient is feeling fine today with no specific complaints. He denied having any chest pain but continues to have shortness of breath with exertion with no cough or hemoptysis.  He denies weight loss or night sweats.  He denied having any nausea or vomiting. He has no fever or chills. He has no palpable lymphadenopathy. He is here today for evaluation with repeat blood work and to discuss his restaging CT scan results.  MEDICAL HISTORY: Past Medical History:  Diagnosis Date  . BPH (benign prostatic hyperplasia) 2011  . Congestive heart failure ([facility])   . COPD (chronic obstructive pulmonary disease) ([facility])   . Depression   . Drug-induced neutropenia ([facility] [facility]) 10/08/2015  . Encounter for antineoplastic chemotherapy 10/08/2015  . Gastrointestinal obstruction ([facility])    Ileus  . Gout   . Hypertension   . Hypothyroid   . Lung cancer ([facility])   . NHL (non-Hodgkin's lymphoma) ([facility]) 09/25/2015  . Obesity   . Polycythemia   . Pulmonary hypertension ([facility] [facility])   . Sepsis ([facility]) 01/14/2016  . Streptococcal pneumonia [facility]) August 2009    ALLERGIES:  has No Known Allergies.  MEDICATIONS:  Current Outpatient Medications  Medication Sig Dispense Refill  . albuterol (PROVENTIL) (2.5 MG/3ML) 0.083% nebulizer solution INHALE 3 MILLILITERS (2.5 MG) BY NEBULIZATION ROUTE EVERY 8 HOURS AS NEEDED for shortness of breath  3  . allopurinol (ZYLOPRIM) 300 MG tablet Take  300 mg by mouth daily.    [name] amLODipine (NORVASC) 5 MG tablet Take 1 tablet by mouth Daily.    . betamethasone dipropionate 0.05 % lotion Apply 1 application topically 2 (two) times daily as needed.    . finasteride (PROSCAR) 5 MG tablet Take 5 mg by mouth daily.    [name] levothyroxine (SYNTHROID, LEVOTHROID) 150 MCG tablet Take 150 mcg by mouth daily.    [name] lidocaine-prilocaine (EMLA) cream Apply 1 application topically as needed. 30 g 0  . lisinopril (PRINIVIL,ZESTRIL) 10 MG tablet Take 1 tablet (10 mg total) by mouth daily. 30 tablet 11  . omeprazole (PRILOSEC) 20 MG capsule Take 1 tablet by mouth Twice daily.    [name] oxyCODONE-acetaminophen (PERCOCET) 10-325 MG tablet Take 1 tablet by mouth every 4 (four) hours as needed for pain.    [name] PROAIR HFA 108 (90 Base) MCG/ACT inhaler Inhale 2 puffs every 6 (six) hours as needed into the lungs for wheezing or shortness of breath. 3 Inhaler 1  . tiotropium (SPIRIVA) 18 MCG inhalation capsule Place 1 capsule (18 mcg total) daily into inhaler and inhale. 90 capsule 1   No current facility-administered medications for this visit.     SURGICAL HISTORY:  Past Surgical History:  Procedure Laterality Date  . APPENDECTOMY    . BACK SURGERY    . CARDIOVERSION  11/27/2011   Procedure: CARDIOVERSION;  Surgeon: Thayer Headings, MD;  Location: [facility];  Service: Cardiovascular;  Laterality: N/A;  . LUNG CANCER SURGERY  2011    REVIEW OF SYSTEMS:   Review of Systems  Constitutional:  Negative for appetite change, chills, fatigue, fever and unexpected weight change.  HENT:   Negative for mouth sores, nosebleeds, sore throat and trouble swallowing.   Eyes: Negative for eye problems and icterus.  Respiratory: Negative for cough, hemoptysis, shortness of breath at rest and wheezing.  Positive for shortness of breath with exertion.  Cardiovascular: Negative for chest pain and leg swelling.  Gastrointestinal: Negative for abdominal pain, constipation, diarrhea,  nausea and vomiting.  Genitourinary: Negative for bladder incontinence, difficulty urinating, dysuria, frequency and hematuria.   Musculoskeletal: Negative for back pain, gait problem, neck pain and neck stiffness.  Skin: Negative for itching and rash.  Neurological: Negative for dizziness, extremity weakness, gait problem, headaches, light-headedness and seizures.  Hematological: Negative for adenopathy. Does not bruise/bleed easily.  Psychiatric/Behavioral: Negative for confusion, depression and sleep disturbance. The patient is not nervous/anxious.     PHYSICAL EXAMINATION:  Blood pressure (!) 145/67, pulse 60, temperature 98.6 F (37 C), temperature source Oral, resp. rate 17, height 6' (1.829 m), weight 239 lb (108.4 kg), SpO2 93 %.  ECOG PERFORMANCE STATUS: 1 - Symptomatic but completely ambulatory  Physical Exam  Constitutional: Oriented to person, place, and time and well-developed, well-nourished, and in no distress. No distress.  HENT:  Head: Normocephalic and atraumatic.  Mouth/Throat: Oropharynx is clear and moist. No oropharyngeal exudate.  Eyes: Conjunctivae are normal. Right eye exhibits no discharge. Left eye exhibits no discharge. No scleral icterus.  Neck: Normal range of motion. Neck supple.  Cardiovascular: Normal rate, regular rhythm, normal heart sounds and intact distal pulses.   Pulmonary/Chest: Effort normal and breath sounds normal. No respiratory distress. No wheezes. No rales.  Abdominal: Soft. Bowel sounds are normal. Exhibits no distension and no mass. There is no tenderness.  Musculoskeletal: Normal range of motion. Exhibits no edema.  Lymphadenopathy:    No cervical adenopathy.  Neurological: Alert and oriented to person, place, and time. Exhibits normal muscle tone. Gait normal. Coordination normal.  Skin: Skin is warm and dry. No rash noted. Not diaphoretic. No erythema. No pallor.  Psychiatric: Mood, memory and judgment normal.  Vitals  reviewed.  LABORATORY DATA: Lab Results  Component Value Date   WBC 7.8 09/07/2017   HGB 12.9 (L) 09/07/2017   HCT 40.7 09/07/2017   MCV 92.3 09/07/2017   PLT 160 09/07/2017      Chemistry      Component Value Date/Time   NA 140 09/07/2017 1322   K 5.2 No visable hemolysis (H) 09/07/2017 1322   CL 108 01/16/2016 0525   CO2 33 (H) 09/07/2017 1322   BUN 13.6 09/07/2017 1322   CREATININE 1.0 09/07/2017 1322      Component Value Date/Time   CALCIUM 9.1 09/07/2017 1322   ALKPHOS 92 09/07/2017 1322   AST 11 09/07/2017 1322   ALT 8 09/07/2017 1322   BILITOT 0.47 09/07/2017 1322       RADIOGRAPHIC STUDIES:  Ct Chest W Contrast  Result Date: 09/08/2017 CLINICAL DATA:  Chest lymphoma.  Restaging. EXAM: CT CHEST, ABDOMEN, AND PELVIS WITH CONTRAST TECHNIQUE: Multidetector CT imaging of the chest, abdomen and pelvis was performed following the standard protocol during bolus administration of intravenous contrast. CONTRAST:  152mL ISOVUE-300 IOPAMIDOL (ISOVUE-300) INJECTION 61% COMPARISON:  08/27/2016 FINDINGS: CT CHEST FINDINGS Cardiovascular: The heart size is normal. No pericardial effusion. Coronary artery calcification is evident. Atherosclerotic calcification is noted in the wall of the thoracic aorta. Right Port-A-Cath tip is positioned in the mid SVC. Mediastinum/Nodes: 12 mm short axis subcarinal lymph  node is stable. Otherwise no mediastinal lymphadenopathy. There is no hilar lymphadenopathy. The esophagus has normal imaging features. There is no axillary lymphadenopathy. Lungs/Pleura: Scarring posterior right upper lobe is similar to prior. Suture line left apex with adjacent scarring is unchanged. Atelectasis noted at the posterior lung bases. Musculoskeletal: Bone windows reveal no worrisome lytic or sclerotic osseous lesions. CT ABDOMEN PELVIS FINDINGS Hepatobiliary: No focal abnormality within the liver parenchyma. There is no evidence for gallstones, gallbladder wall  thickening, or pericholecystic fluid. No intrahepatic or extrahepatic biliary dilation. Pancreas: No focal mass lesion. No dilatation of the main duct. No intraparenchymal cyst. No peripancreatic edema. Spleen: Slight interval decrease in size of the hypoattenuating lesion anterior spleen measuring 3.3 x 4.2 cm today compared to 3.7 x 4.7 cm previously. Adrenals/Urinary Tract: Similar 11 mm right adrenal nodule. Left adrenal gland unremarkable. 2-3 mm nonobstructing stone identified upper pole right kidney with another 2 x 4 mm stone in the lower pole. 5 cm right renal cyst is stable. Tiny cyst upper pole left kidney is similar to prior. Dominant 8.6 cm cyst in the lower pole left kidney is similar. 2 mm nonobstructing interpolar left renal stone evident. Additional scattered tiny hypoattenuating left renal lesions are likely cysts. Mild distention noted in the mid ureter bilaterally without overt hydroureter. The urinary bladder appears normal for the degree of distention. Stomach/Bowel: Stomach is nondistended. No gastric wall thickening. No evidence of outlet obstruction. Duodenum is normally positioned as is the ligament of Treitz. No small bowel wall thickening. No small bowel dilatation. The terminal ileum is normal. The appendix is not visualized, but there is no edema or inflammation in the region of the cecum. Diverticular changes are noted in the left colon without evidence of diverticulitis. Vascular/Lymphatic: There is abdominal aortic atherosclerosis without aneurysm. Small lymph nodes in the hepatoduodenal ligament and retroperitoneal space are similar. Index 10 mm short axis left para-aortic lymph node measured on the prior study is 7 mm short axis today. No pelvic sidewall lymphadenopathy. Reproductive: Dystrophic calcification noted in the central prostate gland. Other: No intraperitoneal free fluid. Musculoskeletal: Small lucency in the left iliac crest is unchanged as is the tiny sclerotic focus  seen anteriorly. No new or progressive lytic or sclerotic osseous abnormality is evident. IMPRESSION: 1. Stable exam.  No new or progressive interval findings. 2. Tiny bilateral nonobstructing renal stones with similar appearance of bilateral renal cysts. 3. Left colonic diverticulosis without diverticulitis. 4. Hypoattenuating splenic lesion measures slightly smaller on today's study. 5.  Aortic Atherosclerois (ICD10-170.0) Electronically Signed   By: Misty Stanley M.D.   On: 09/08/2017 09:45   Ct Abdomen Pelvis W Contrast  Result Date: 09/08/2017 CLINICAL DATA:  Chest lymphoma.  Restaging. EXAM: CT CHEST, ABDOMEN, AND PELVIS WITH CONTRAST TECHNIQUE: Multidetector CT imaging of the chest, abdomen and pelvis was performed following the standard protocol during bolus administration of intravenous contrast. CONTRAST:  150mL ISOVUE-300 IOPAMIDOL (ISOVUE-300) INJECTION 61% COMPARISON:  08/27/2016 FINDINGS: CT CHEST FINDINGS Cardiovascular: The heart size is normal. No pericardial effusion. Coronary artery calcification is evident. Atherosclerotic calcification is noted in the wall of the thoracic aorta. Right Port-A-Cath tip is positioned in the mid SVC. Mediastinum/Nodes: 12 mm short axis subcarinal lymph node is stable. Otherwise no mediastinal lymphadenopathy. There is no hilar lymphadenopathy. The esophagus has normal imaging features. There is no axillary lymphadenopathy. Lungs/Pleura: Scarring posterior right upper lobe is similar to prior. Suture line left apex with adjacent scarring is unchanged. Atelectasis noted at the posterior lung bases.  Musculoskeletal: Bone windows reveal no worrisome lytic or sclerotic osseous lesions. CT ABDOMEN PELVIS FINDINGS Hepatobiliary: No focal abnormality within the liver parenchyma. There is no evidence for gallstones, gallbladder wall thickening, or pericholecystic fluid. No intrahepatic or extrahepatic biliary dilation. Pancreas: No focal mass lesion. No dilatation of  the main duct. No intraparenchymal cyst. No peripancreatic edema. Spleen: Slight interval decrease in size of the hypoattenuating lesion anterior spleen measuring 3.3 x 4.2 cm today compared to 3.7 x 4.7 cm previously. Adrenals/Urinary Tract: Similar 11 mm right adrenal nodule. Left adrenal gland unremarkable. 2-3 mm nonobstructing stone identified upper pole right kidney with another 2 x 4 mm stone in the lower pole. 5 cm right renal cyst is stable. Tiny cyst upper pole left kidney is similar to prior. Dominant 8.6 cm cyst in the lower pole left kidney is similar. 2 mm nonobstructing interpolar left renal stone evident. Additional scattered tiny hypoattenuating left renal lesions are likely cysts. Mild distention noted in the mid ureter bilaterally without overt hydroureter. The urinary bladder appears normal for the degree of distention. Stomach/Bowel: Stomach is nondistended. No gastric wall thickening. No evidence of outlet obstruction. Duodenum is normally positioned as is the ligament of Treitz. No small bowel wall thickening. No small bowel dilatation. The terminal ileum is normal. The appendix is not visualized, but there is no edema or inflammation in the region of the cecum. Diverticular changes are noted in the left colon without evidence of diverticulitis. Vascular/Lymphatic: There is abdominal aortic atherosclerosis without aneurysm. Small lymph nodes in the hepatoduodenal ligament and retroperitoneal space are similar. Index 10 mm short axis left para-aortic lymph node measured on the prior study is 7 mm short axis today. No pelvic sidewall lymphadenopathy. Reproductive: Dystrophic calcification noted in the central prostate gland. Other: No intraperitoneal free fluid. Musculoskeletal: Small lucency in the left iliac crest is unchanged as is the tiny sclerotic focus seen anteriorly. No new or progressive lytic or sclerotic osseous abnormality is evident. IMPRESSION: 1. Stable exam.  No new or  progressive interval findings. 2. Tiny bilateral nonobstructing renal stones with similar appearance of bilateral renal cysts. 3. Left colonic diverticulosis without diverticulitis. 4. Hypoattenuating splenic lesion measures slightly smaller on today's study. 5.  Aortic Atherosclerois (ICD10-170.0) Electronically Signed   By: Misty Stanley M.D.   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 The patient is currently on observation and he is feeling fine except for shortness breath probably secondary to recent weight gain and COPD.  The patient was seen with Dr. Julien Nordmann.  CT scan results were discussed with the patient and his wife.  Explained to them that his CT scan is stable.  Recommend continued observation.  Follow-up visit will be in 6 months for evaluation with repeat blood work.  He was advised to call immediately if he has any concerning symptoms in the interval. The patient voices understanding of current disease status and treatment options and is in agreement with the current care plan. All questions were answered. The patient knows to call the clinic with any problems, questions or concerns. We can certainly see the patient much sooner if necessary.  Orders Placed This Encounter  Procedures  . CBC with Differential/Platelet    Standing Status:   Future  Standing Expiration Date:   09/09/2018  . Comprehensive metabolic panel    Standing Status:   Future    Standing Expiration Date:   09/09/2018  . Lactate dehydrogenase    Standing Status:   Future    Standing Expiration Date:   09/09/2018     Mikey Bussing, DNP, AGPCNP-BC, AOCNP 09/09/17  ADDENDUM: Hematology/Oncology Attending: I had a face-to-face encounter  with the patient.  I recommended his care plan.  This is a very pleasant 66 years old white male with history of a stage III large B cell non-Hodgkin lymphoma status post 6 cycles of systemic chemotherapy with CHOP/Rituxan with complete response.  The patient has been on observation and he is doing fine with no specific complaints.  He had repeat CT scan of the chest, abdomen and pelvis performed recently.  Has a scan showed no evidence for disease recurrence.  I discussed the scan results with the patient and his wife and recommended for him to continue in observation.  I will see him back for follow-up visit in 6 months for evaluation and repeat CBC, comprehensive metabolic panel and LDH.  The patient was advised to call immediately if he has any concerning symptoms in the interval.  Disclaimer: This note was dictated with voice recognition software. Similar sounding words can inadvertently be transcribed and may be missed upon review. Eilleen Kempf, MD 09/11/17

## 2017-09-25 DIAGNOSIS — J449 Chronic obstructive pulmonary disease, unspecified: Secondary | ICD-10-CM | POA: Diagnosis not present

## 2017-09-26 DIAGNOSIS — J449 Chronic obstructive pulmonary disease, unspecified: Secondary | ICD-10-CM | POA: Diagnosis not present

## 2017-09-30 ENCOUNTER — Other Ambulatory Visit: Payer: Self-pay

## 2017-09-30 ENCOUNTER — Emergency Department (HOSPITAL_COMMUNITY): Payer: PPO

## 2017-09-30 ENCOUNTER — Inpatient Hospital Stay (HOSPITAL_COMMUNITY)
Admission: EM | Admit: 2017-09-30 | Discharge: 2017-10-02 | DRG: 193 | Disposition: A | Discharge status: Home / Self Care | Payer: PPO | Attending: Internal Medicine | Admitting: Internal Medicine

## 2017-09-30 ENCOUNTER — Encounter (HOSPITAL_COMMUNITY): Payer: Self-pay

## 2017-09-30 DIAGNOSIS — R1310 Dysphagia, unspecified: Secondary | ICD-10-CM | POA: Diagnosis not present

## 2017-09-30 DIAGNOSIS — Z8572 Personal history of non-Hodgkin lymphomas: Secondary | ICD-10-CM | POA: Diagnosis not present

## 2017-09-30 DIAGNOSIS — R069 Unspecified abnormalities of breathing: Secondary | ICD-10-CM | POA: Diagnosis not present

## 2017-09-30 DIAGNOSIS — C8258 Diffuse follicle center lymphoma, lymph nodes of multiple sites: Secondary | ICD-10-CM

## 2017-09-30 DIAGNOSIS — J441 Chronic obstructive pulmonary disease with (acute) exacerbation: Secondary | ICD-10-CM | POA: Diagnosis not present

## 2017-09-30 DIAGNOSIS — Z85118 Personal history of other malignant neoplasm of bronchus and lung: Secondary | ICD-10-CM

## 2017-09-30 DIAGNOSIS — J449 Chronic obstructive pulmonary disease, unspecified: Secondary | ICD-10-CM | POA: Diagnosis present

## 2017-09-30 DIAGNOSIS — F329 Major depressive disorder, single episode, unspecified: Secondary | ICD-10-CM | POA: Diagnosis not present

## 2017-09-30 DIAGNOSIS — J189 Pneumonia, unspecified organism: Secondary | ICD-10-CM | POA: Diagnosis not present

## 2017-09-30 DIAGNOSIS — E039 Hypothyroidism, unspecified: Secondary | ICD-10-CM | POA: Diagnosis not present

## 2017-09-30 DIAGNOSIS — I4891 Unspecified atrial fibrillation: Secondary | ICD-10-CM | POA: Diagnosis not present

## 2017-09-30 DIAGNOSIS — J9611 Chronic respiratory failure with hypoxia: Secondary | ICD-10-CM | POA: Diagnosis present

## 2017-09-30 DIAGNOSIS — I10 Essential (primary) hypertension: Secondary | ICD-10-CM | POA: Diagnosis not present

## 2017-09-30 DIAGNOSIS — J9621 Acute and chronic respiratory failure with hypoxia: Secondary | ICD-10-CM | POA: Diagnosis not present

## 2017-09-30 DIAGNOSIS — K219 Gastro-esophageal reflux disease without esophagitis: Secondary | ICD-10-CM | POA: Diagnosis present

## 2017-09-30 DIAGNOSIS — Z902 Acquired absence of lung [part of]: Secondary | ICD-10-CM

## 2017-09-30 DIAGNOSIS — M109 Gout, unspecified: Secondary | ICD-10-CM | POA: Diagnosis present

## 2017-09-30 DIAGNOSIS — C8318 Mantle cell lymphoma, lymph nodes of multiple sites: Secondary | ICD-10-CM

## 2017-09-30 DIAGNOSIS — R05 Cough: Secondary | ICD-10-CM | POA: Diagnosis not present

## 2017-09-30 DIAGNOSIS — N400 Enlarged prostate without lower urinary tract symptoms: Secondary | ICD-10-CM | POA: Diagnosis not present

## 2017-09-30 DIAGNOSIS — J440 Chronic obstructive pulmonary disease with acute lower respiratory infection: Secondary | ICD-10-CM | POA: Diagnosis present

## 2017-09-30 DIAGNOSIS — Z87891 Personal history of nicotine dependence: Secondary | ICD-10-CM | POA: Diagnosis not present

## 2017-09-30 DIAGNOSIS — I272 Pulmonary hypertension, unspecified: Secondary | ICD-10-CM | POA: Diagnosis present

## 2017-09-30 DIAGNOSIS — R109 Unspecified abdominal pain: Secondary | ICD-10-CM | POA: Diagnosis not present

## 2017-09-30 DIAGNOSIS — I480 Paroxysmal atrial fibrillation: Secondary | ICD-10-CM | POA: Diagnosis present

## 2017-09-30 DIAGNOSIS — C8332 Diffuse large B-cell lymphoma, intrathoracic lymph nodes: Secondary | ICD-10-CM | POA: Diagnosis not present

## 2017-09-30 DIAGNOSIS — Z79899 Other long term (current) drug therapy: Secondary | ICD-10-CM | POA: Diagnosis not present

## 2017-09-30 DIAGNOSIS — R0602 Shortness of breath: Secondary | ICD-10-CM | POA: Diagnosis not present

## 2017-09-30 DIAGNOSIS — Z9981 Dependence on supplemental oxygen: Secondary | ICD-10-CM

## 2017-09-30 DIAGNOSIS — J181 Lobar pneumonia, unspecified organism: Secondary | ICD-10-CM

## 2017-09-30 DIAGNOSIS — C8590 Non-Hodgkin lymphoma, unspecified, unspecified site: Secondary | ICD-10-CM | POA: Diagnosis present

## 2017-09-30 LAB — CBC WITH DIFFERENTIAL/PLATELET
BASOS PCT: 0 %
Basophils Absolute: 0 10*3/uL (ref 0.0–0.1)
EOS ABS: 0 10*3/uL (ref 0.0–0.7)
Eosinophils Relative: 0 %
HEMATOCRIT: 40.5 % (ref 39.0–52.0)
HEMOGLOBIN: 13.2 g/dL (ref 13.0–17.0)
Lymphocytes Relative: 3 %
Lymphs Abs: 0.5 10*3/uL — ABNORMAL LOW (ref 0.7–4.0)
MCH: 29.9 pg (ref 26.0–34.0)
MCHC: 32.6 g/dL (ref 30.0–36.0)
MCV: 91.6 fL (ref 78.0–100.0)
MONO ABS: 0.6 10*3/uL (ref 0.1–1.0)
Monocytes Relative: 4 %
NEUTROS ABS: 17.2 10*3/uL — AB (ref 1.7–7.7)
NEUTROS PCT: 93 %
Platelets: 171 10*3/uL (ref 150–400)
RBC: 4.42 MIL/uL (ref 4.22–5.81)
RDW: 14.1 % (ref 11.5–15.5)
WBC: 18.4 10*3/uL — ABNORMAL HIGH (ref 4.0–10.5)

## 2017-09-30 LAB — I-STAT CG4 LACTIC ACID, ED: Lactic Acid, Venous: 0.74 mmol/L (ref 0.5–1.9)

## 2017-09-30 LAB — TROPONIN I

## 2017-09-30 LAB — COMPREHENSIVE METABOLIC PANEL
ALK PHOS: 78 U/L (ref 38–126)
ALT: 12 U/L — AB (ref 17–63)
ANION GAP: 6 (ref 5–15)
AST: 19 U/L (ref 15–41)
Albumin: 4 g/dL (ref 3.5–5.0)
BILIRUBIN TOTAL: 0.8 mg/dL (ref 0.3–1.2)
BUN: 18 mg/dL (ref 6–20)
CALCIUM: 8.9 mg/dL (ref 8.9–10.3)
CO2: 30 mmol/L (ref 22–32)
CREATININE: 1.02 mg/dL (ref 0.61–1.24)
Chloride: 102 mmol/L (ref 101–111)
GFR calc non Af Amer: >60 mL/min (ref >60)
Glucose, Bld: 165 mg/dL — ABNORMAL HIGH (ref 65–99)
Potassium: 4.8 mmol/L (ref 3.5–5.1)
Sodium: 138 mmol/L (ref 135–145)
TOTAL PROTEIN: 6.8 g/dL (ref 6.5–8.1)

## 2017-09-30 LAB — URINALYSIS, ROUTINE W REFLEX MICROSCOPIC
Bilirubin Urine: NEGATIVE
GLUCOSE, UA: NEGATIVE mg/dL
Ketones, ur: NEGATIVE mg/dL
Leukocytes, UA: NEGATIVE
NITRITE: NEGATIVE
PH: 6 (ref 5.0–8.0)
PROTEIN: NEGATIVE mg/dL
Specific Gravity, Urine: 1.016 (ref 1.005–1.030)

## 2017-09-30 LAB — CG4 I-STAT (LACTIC ACID): Lactic Acid, Venous: 0.72 mmol/L (ref 0.5–1.9)

## 2017-09-30 LAB — LIPASE, BLOOD: Lipase: 17 U/L (ref 11–51)

## 2017-09-30 MED ORDER — SODIUM CHLORIDE 0.9% FLUSH
3.0000 mL | INTRAVENOUS | Status: DC | PRN
  Administered 2017-10-02: 3 mL via INTRAVENOUS
  Filled 2017-09-30: qty 3

## 2017-09-30 MED ORDER — ALLOPURINOL 300 MG PO TABS
300.0000 mg | ORAL_TABLET | Freq: Every day | ORAL | Status: DC
  Administered 2017-10-01 – 2017-10-02 (×2): 300 mg via ORAL
  Filled 2017-09-30 (×2): qty 1

## 2017-09-30 MED ORDER — ALBUTEROL SULFATE HFA 108 (90 BASE) MCG/ACT IN AERS: 2.0000 | INHALATION_SPRAY | Freq: Four times a day (QID) | RESPIRATORY_TRACT | Status: DC | PRN

## 2017-09-30 MED ORDER — IPRATROPIUM-ALBUTEROL 0.5-2.5 (3) MG/3ML IN SOLN
3.0000 mL | Freq: Once | RESPIRATORY_TRACT | Status: AC
  Administered 2017-09-30: 3 mL via RESPIRATORY_TRACT
  Filled 2017-09-30: qty 3

## 2017-09-30 MED ORDER — LEVOTHYROXINE SODIUM 50 MCG PO TABS
150.0000 ug | ORAL_TABLET | Freq: Every day | ORAL | Status: DC
  Administered 2017-10-02: 150 ug via ORAL
  Filled 2017-09-30 (×3): qty 1

## 2017-09-30 MED ORDER — DEXTROSE 5 % IV SOLN
1.0000 g | INTRAVENOUS | Status: DC
  Filled 2017-09-30 (×2): qty 10

## 2017-09-30 MED ORDER — OXYCODONE-ACETAMINOPHEN 5-325 MG PO TABS
1.0000 | ORAL_TABLET | ORAL | Status: DC | PRN
  Administered 2017-09-30 – 2017-10-02 (×10): 1 via ORAL
  Filled 2017-09-30 (×10): qty 1

## 2017-09-30 MED ORDER — TIOTROPIUM BROMIDE MONOHYDRATE 18 MCG IN CAPS
18.0000 ug | ORAL_CAPSULE | Freq: Every day | RESPIRATORY_TRACT | Status: DC
  Administered 2017-10-01: 18 ug via RESPIRATORY_TRACT
  Filled 2017-09-30 (×2): qty 5

## 2017-09-30 MED ORDER — SODIUM CHLORIDE 0.9% FLUSH
3.0000 mL | Freq: Two times a day (BID) | INTRAVENOUS | Status: DC
  Administered 2017-09-30 – 2017-10-01 (×3): 3 mL via INTRAVENOUS

## 2017-09-30 MED ORDER — FINASTERIDE 5 MG PO TABS
5.0000 mg | ORAL_TABLET | Freq: Every day | ORAL | Status: DC
  Administered 2017-10-01 – 2017-10-02 (×2): 5 mg via ORAL
  Filled 2017-09-30 (×2): qty 1

## 2017-09-30 MED ORDER — ONDANSETRON HCL 4 MG/2ML IJ SOLN
4.0000 mg | Freq: Once | INTRAMUSCULAR | Status: AC
  Administered 2017-09-30: 4 mg via INTRAVENOUS
  Filled 2017-09-30: qty 2

## 2017-09-30 MED ORDER — DEXTROSE 5 % IV SOLN
500.0000 mg | Freq: Once | INTRAVENOUS | Status: AC
  Administered 2017-09-30: 500 mg via INTRAVENOUS
  Filled 2017-09-30: qty 500

## 2017-09-30 MED ORDER — OXYCODONE HCL 5 MG PO TABS
5.0000 mg | ORAL_TABLET | ORAL | Status: DC | PRN
Start: 2017-09-30 — End: 2017-10-02
  Administered 2017-09-30 – 2017-10-02 (×9): 5 mg via ORAL
  Filled 2017-09-30 (×9): qty 1

## 2017-09-30 MED ORDER — OXYCODONE-ACETAMINOPHEN 10-325 MG PO TABS: 1.0000 | ORAL_TABLET | ORAL | Status: DC | PRN

## 2017-09-30 MED ORDER — LISINOPRIL 10 MG PO TABS
10.0000 mg | ORAL_TABLET | Freq: Every day | ORAL | Status: DC
  Administered 2017-10-01 – 2017-10-02 (×2): 10 mg via ORAL
  Filled 2017-09-30 (×3): qty 1

## 2017-09-30 MED ORDER — IOPAMIDOL (ISOVUE-370) INJECTION 76%
INTRAVENOUS | Status: AC
  Administered 2017-09-30: 100 mL via INTRAVENOUS
  Filled 2017-09-30: qty 100

## 2017-09-30 MED ORDER — AMOXICILLIN-POT CLAVULANATE 875-125 MG PO TABS: 1.0000 | ORAL_TABLET | Freq: Two times a day (BID) | ORAL | Status: DC | PRN

## 2017-09-30 MED ORDER — ALBUTEROL SULFATE (2.5 MG/3ML) 0.083% IN NEBU: 2.5000 mg | INHALATION_SOLUTION | RESPIRATORY_TRACT | Status: DC | PRN

## 2017-09-30 MED ORDER — AMLODIPINE BESYLATE 5 MG PO TABS
5.0000 mg | ORAL_TABLET | Freq: Every day | ORAL | Status: DC
  Administered 2017-10-01 – 2017-10-02 (×2): 5 mg via ORAL
  Filled 2017-09-30 (×2): qty 1

## 2017-09-30 MED ORDER — MORPHINE SULFATE (PF) 4 MG/ML IV SOLN
4.0000 mg | Freq: Once | INTRAVENOUS | Status: AC
Start: 2017-09-30 — End: 2017-09-30
  Administered 2017-09-30: 4 mg via INTRAVENOUS
  Filled 2017-09-30: qty 1

## 2017-09-30 MED ORDER — DEXTROSE 5 % IV SOLN
500.0000 mg | INTRAVENOUS | Status: DC
  Administered 2017-10-01: 500 mg via INTRAVENOUS
  Filled 2017-09-30: qty 500

## 2017-09-30 MED ORDER — DEXTROSE 5 % IV SOLN
1.0000 g | Freq: Once | INTRAVENOUS | Status: AC
  Administered 2017-09-30: 1 g via INTRAVENOUS
  Filled 2017-09-30: qty 10

## 2017-09-30 MED ORDER — SODIUM CHLORIDE 0.9 % IV SOLN
250.0000 mL | INTRAVENOUS | Status: DC | PRN
  Administered 2017-10-02: 250 mL via INTRAVENOUS

## 2017-09-30 MED ORDER — BETAMETHASONE DIPROPIONATE 0.05 % EX LOTN: 1.0000 "application " | TOPICAL_LOTION | Freq: Two times a day (BID) | CUTANEOUS | Status: DC | PRN

## 2017-09-30 MED ORDER — IOPAMIDOL (ISOVUE-370) INJECTION 76%
100.0000 mL | Freq: Once | INTRAVENOUS | Status: AC | PRN
  Administered 2017-09-30: 100 mL via INTRAVENOUS

## 2017-09-30 NOTE — ED Provider Notes (Addendum)
[facility] DEPT Provider Note   CSN: [id number] Arrival date & time: 09/30/17  0342     History   Chief Complaint Chief Complaint  Patient presents with  . Shortness of Breath  . Abdominal Pain    HPI SETH HIGGINBOTHAM is a 67 y.o. male.  Pt presents to the ED today with sob, cough, and abdominal pain.  Pt has a hx of large B cell non-Hodgkin lymphoma s/p 6 cycles of CHOP/Rituxan with almost complete response.  His last CT of chest/abdomen/pelvis done in December of 2018 was stable.  Pt also has a hx of getting pneumonia easily.  He has augmentin on hand to take if sx start.  He took a dose last night around 2000.  The pt said his breathing worsened, so he called EMS.  He also c/o lower abdominal pain that started around the same time.  EMS said O2 sats were low upper 80s on his home O2.  They had to increase him up to 6 L.  During the day he is not usually on oxygen, just at night.         Past Medical History:  Diagnosis Date  . BPH (benign prostatic hyperplasia) 2011  . Congestive heart failure ([place])   . COPD (chronic obstructive pulmonary disease) ([place])   . Depression   . Drug-induced neutropenia ([place]) 10/08/2015  . Encounter for antineoplastic chemotherapy 10/08/2015  . Gastrointestinal obstruction ([facility])    Ileus  . Gout   . Hypertension   . Hypothyroid   . Lung cancer ([place])   . NHL (non-Hodgkin's lymphoma) ([place]) 09/25/2015  . Obesity   . Polycythemia   . Pulmonary hypertension ([place])   . Sepsis ([place]) 01/14/2016  . Streptococcal pneumonia [facility]) August 2009    Patient Active Problem List   Diagnosis Date Noted  . Port catheter in place 08/20/2016  . Sepsis ([place]) 01/14/2016  . AKI (acute kidney injury) ([place]) 01/14/2016  . Severe sepsis ([place]) 01/14/2016  . COPD, severe ([place]) 11/27/2015  . Adjustment disorder with other symptom 11/27/2015  . Encounter for antineoplastic chemotherapy 10/08/2015  . Drug-induced neutropenia ([place])  10/08/2015  . 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 NHL (non-Hodgkin's lymphoma) ([place]) 09/25/2015  . Lymphadenopathy syndrome 09/12/2015  . Retroperitoneal lymphadenopathy 09/12/2015  . Testicular swelling 09/12/2015  . Chronic respiratory failure with hypoxia ([place]) 08/27/2015  . Gout attack 08/29/2012  . Hypothyroidism 03/30/2012  . Recurrent pneumonia 02/10/2012  . Routine general medical examination at a health care facility 12/03/2011  . Depression 12/03/2011  . Unspecified hypothyroidism 12/03/2011  . Pure hypercholesterolemia 12/03/2011  . Atrial fibrillation ([place]) 10/26/2011  . Post-nasal drip 10/04/2011  . Other dysphagia 11/11/2010  . Squamous cell carcinoma lung ([place]) 09/26/2010  . BRONCHOPNEUMONIA ORGANISM UNSPECIFIED 06/12/2010  . PULMONARY NODULE, SOLITARY 06/12/2010  . ERECTILE DYSFUNCTION 09/24/2008  . POLYCYTHEMIA 06/05/2008  . Essential hypertension 06/05/2008  . COPD (chronic obstructive pulmonary disease) ([place]) 06/05/2008    Past Surgical History:  Procedure Laterality Date  . APPENDECTOMY    . BACK SURGERY    . CARDIOVERSION  11/27/2011   Procedure: CARDIOVERSION;  Surgeon: Thayer Headings, MD;  Location: [facility];  Service: Cardiovascular;  Laterality: N/A;  . LUNG CANCER SURGERY  2011       Home Medications    Prior to Admission medications   Medication Sig Start Date End Date Taking? Authorizing Provider  albuterol (PROVENTIL) (2.5 MG/3ML) 0.083% nebulizer  solution INHALE 3 MILLILITERS (2.5 MG) BY NEBULIZATION ROUTE EVERY 8 HOURS AS NEEDED for shortness of breath 08/30/15  Yes [provider]  allopurinol (ZYLOPRIM) 300 MG tablet Take 300 mg by mouth daily. 01/15/17  Yes [provider]  amLODipine (NORVASC) 5 MG tablet Take 1 tablet by mouth Daily. 03/09/11  Yes [provider]  amoxicillin-clavulanate (AUGMENTIN) 875-125 MG tablet Take 1 tablet by mouth 2 (two) times daily as needed  (pneumonia).   Yes [provider]  betamethasone dipropionate 0.05 % lotion Apply 1 application topically 2 (two) times daily as needed (psorasis).  01/19/17  Yes [provider]  finasteride (PROSCAR) 5 MG tablet Take 5 mg by mouth daily.   Yes [provider]  levothyroxine (SYNTHROID, LEVOTHROID) 150 MCG tablet Take 150 mcg by mouth daily. 11/07/15  Yes [provider]  lisinopril (PRINIVIL,ZESTRIL) 10 MG tablet Take 1 tablet (10 mg total) by mouth daily. 12/29/11  Yes Nahser, Wonda Cheng, MD  omeprazole (PRILOSEC) 20 MG capsule Take 1 tablet by mouth Twice daily. 07/01/12  Yes [provider]  oxyCODONE-acetaminophen (PERCOCET) 10-325 MG tablet Take 1 tablet by mouth every 4 (four) hours as needed for pain.   Yes [provider]  PROAIR HFA 108 (90 Base) MCG/ACT inhaler Inhale 2 puffs every 6 (six) hours as needed into the lungs for wheezing or shortness of breath. 08/06/17  Yes Parrett, Tammy S, NP  tiotropium (SPIRIVA) 18 MCG inhalation capsule Place 1 capsule (18 mcg total) daily into inhaler and inhale. 08/06/17  Yes Parrett, Tammy S, NP  lidocaine-prilocaine (EMLA) cream Apply 1 application topically as needed. Patient not taking: Reported on 09/30/2017 09/25/15   Curt Bears, MD    Family History Family History  Problem Relation Age of Onset  . Heart disease Father   . Cancer Neg Hx   . Diabetes Neg Hx   . Stroke Neg Hx   . Hypertension Neg Hx   . Kidney disease Neg Hx     Social History Social History   Tobacco Use  . Smoking status: Former Smoker    Packs/day: 3.00    Years: 43.00    Pack years: 129.00    Types: Cigarettes    Last attempt to quit: 10/22/2001    Years since quitting: 15.9  . Smokeless tobacco: Never Used  Substance Use Topics  . Alcohol use: No  . Drug use: No     Allergies   Patient has no known allergies.   Review of Systems Review of Systems  Respiratory: Positive for cough and shortness of  breath.   Gastrointestinal: Positive for abdominal pain.  All other systems reviewed and are negative.    Physical Exam Updated Vital Signs BP 138/66   Pulse 89   Temp 99.4 F (37.4 C) (Oral)   Resp (!) 21   SpO2 99%   Physical Exam  Constitutional: He is oriented to person, place, and time. He appears well-developed and well-nourished. He appears ill.  HENT:  Head: Normocephalic and atraumatic.  Mouth/Throat: Oropharynx is clear and moist.  Eyes: EOM are normal. Pupils are equal, round, and reactive to light.  Neck: Normal range of motion. Neck supple.  Cardiovascular: Normal rate and regular rhythm.  Pulmonary/Chest: Effort normal. Tachypnea noted. He has wheezes. He has rales.  Abdominal: There is tenderness in the suprapubic area.  Musculoskeletal: Normal range of motion.       Right lower leg: Normal.       Left lower  leg: Normal.  Neurological: He is alert and oriented to person, place, and time.  Skin: Skin is warm and dry. Capillary refill takes less than 2 seconds.  Psychiatric: He has a normal mood and affect. His behavior is normal.  Nursing note and vitals reviewed.    ED Treatments / Results  Labs (all labs ordered are listed, but only abnormal results are displayed) Labs Reviewed  COMPREHENSIVE METABOLIC PANEL - Abnormal; Notable for the following components:      Result Value   Glucose, Bld 165 (*)    ALT 12 (*)    All other components within normal limits  CBC WITH DIFFERENTIAL/PLATELET - Abnormal; Notable for the following components:   WBC 18.4 (*)    Neutro Abs 17.2 (*)    Lymphs Abs 0.5 (*)    All other components within normal limits  URINALYSIS, ROUTINE W REFLEX MICROSCOPIC - Abnormal; Notable for the following components:   Hgb urine dipstick SMALL (*)    Bacteria, UA RARE (*)    Squamous Epithelial / LPF 0-5 (*)    All other components within normal limits  CULTURE, BLOOD (ROUTINE X 2)  CULTURE, BLOOD (ROUTINE X 2)  TROPONIN I  LIPASE,  BLOOD  I-STAT CG4 LACTIC ACID, ED    EKG  EKG Interpretation  Date/Time:  Thursday September 30 2017 04:17:16 EST Ventricular Rate:  76 PR Interval:    QRS Duration: 103 QT Interval:  383 QTC Calculation: 431 R Axis:   84 Text Interpretation:  Sinus rhythm Borderline right axis deviation Confirmed by Isla Pence [phone]) on 09/30/2017 4:25:26 AM       Radiology Dg Chest 2 View  Result Date: 09/30/2017 CLINICAL DATA:  Shortness of breath EXAM: CHEST  2 VIEW COMPARISON:  Chest CT 09/07/2017 FINDINGS: There is a power injectable right chest wall Port-A-Cath tip at the cavoatrial junction. Calcification along the pleural surface of the right hemi diaphragmatic dome is unchanged. There is bibasilar atelectasis, right worse than left. No focal airspace consolidation. No pulmonary edema. No pneumothorax or pleural effusion. IMPRESSION: Bibasilar atelectasis, right worse than left. Electronically Signed   By: Ulyses Jarred M.D.   On: 09/30/2017 04:27   Ct Angio Chest Pe W And/or Wo Contrast  Result Date: 09/30/2017 CLINICAL DATA:  67 year old male with shortness of breath. EXAM: CT ANGIOGRAPHY CHEST WITH CONTRAST TECHNIQUE: Multidetector CT imaging of the chest was performed using the standard protocol during bolus administration of intravenous contrast. Multiplanar CT image reconstructions and MIPs were obtained to evaluate the vascular anatomy. CONTRAST:  100 cc Isovue 370 COMPARISON:  Chest radiograph dated 09/30/2017 FINDINGS: Cardiovascular: Borderline cardiomegaly. No pericardial effusion. Coronary vascular calcification primarily involving the LAD. Mild atherosclerotic calcification of the thoracic aorta. No aneurysmal dilatation or evidence of dissection. The origins of the great vessels of the aortic arch are patent as visualized. There is dilatation of the main pulmonary trunk suggestive of pulmonary hypertension. Evaluation of the pulmonary arteries is limited due to suboptimal  opacification of the peripheral branches. No large or central pulmonary artery embolus identified. Right-sided Port-A-Cath with tip at the cavoatrial junction. Mediastinum/Nodes: Mildly enlarged right hilar lymph nodes measure approximately 17 mm in short axis. No mediastinal adenopathy. Esophagus is grossly unremarkable. Punctate calcific foci noted in the region of thyroid gland. Lungs/Pleura: Bilateral lower lobe predominant ground-glass and nodular densities concerning for pneumonia. Clinical correlation is recommended. There is thickening of the walls of the lower lobe bronchi. Mucus secretions noted in the mainstem bronchi extending  into the lower lobe bronchi. Aspiration is not excluded. There is a focal area of nodularity in the right upper lobe (series 5, image 101) measuring up to 9 mm, likely inflammatory/infectious or scarring. This is somewhat similar in appearance to the CT of 09/07/2017. There is no pleural effusion or pneumothorax. Right lung base calcified pleural plaque. Upper Abdomen: No acute abnormality. Musculoskeletal: No chest wall abnormality. No acute or significant osseous findings. Review of the MIP images confirms the above findings. IMPRESSION: 1. No CT evidence of large or central pulmonary artery embolus. 2. Bilateral lower lobe predominant ground-glass and nodular densities likely pneumonia and possibly aspiration. Clinical correlation and follow-up is recommended. In the bronchial mucus content extend from the mainstem bronchi into the lower lobe bronchi bilaterally. 3. Mildly dilated pulmonary trunk suggestive of underlying pulmonary hypertension. 4. Borderline cardiomegaly. Electronically Signed   By: Anner Crete M.D.   On: 09/30/2017 06:42    Procedures Procedures (including critical care time)  Medications Ordered in ED Medications  cefTRIAXone (ROCEPHIN) 1 g in dextrose 5 % 50 mL IVPB (not administered)  azithromycin (ZITHROMAX) 500 mg in dextrose 5 % 250 mL IVPB  (not administered)  ipratropium-albuterol (DUONEB) 0.5-2.5 (3) MG/3ML nebulizer solution 3 mL (3 mLs Nebulization Given 09/30/17 0425)  ondansetron (ZOFRAN) injection 4 mg (4 mg Intravenous Given 09/30/17 0425)  morphine 4 MG/ML injection 4 mg (4 mg Intravenous Given 09/30/17 0426)  iopamidol (ISOVUE-370) 76 % injection 100 mL (100 mLs Intravenous Contrast Given 09/30/17 0610)     Initial Impression / Assessment and Plan / ED Course  I have reviewed the triage vital signs and the nursing notes.  Pertinent labs & imaging results that were available during my care of the patient were reviewed by me and considered in my medical decision making (see chart for details).  Pt is feeling a little better.  He was given rocephin and zithromax for CAP.  He is hypoxic and sob with movement, so he was d/w Dr. Shanon Brow (triad) for admission.  Final Clinical Impressions(s) / ED Diagnoses   Final diagnoses:  Community acquired pneumonia of left lower lobe of lung ([place])  Community acquired pneumonia of right lower lobe of lung [facility])    ED Discharge Orders    None       Isla Pence, MD 09/30/17 [id number]    Isla Pence, MD 09/30/17 [phone]

## 2017-09-30 NOTE — H&P (Signed)
History and Physical    [name] BPZ:[id number] DOB: 1950-12-24 DOA: 09/30/2017  PCP: Iva Lento, PA-C  Patient coming from:  home  Chief Complaint:  sob  HPI: [name] is a 67 y.o. male with medical history significant of NHL in remission for a year, copd, CRF on qhs oxygen at 2 liters [place], HTN comes in with sob for one day.  He was sob all night despite his oxygen he sleeps with.  Denies fevers, chills, cough.  No n/v/d.  He has not been on antibiotics for over 6 months.  He called his PCP who told him to take some augmentin he had last night, he has only had one dose of that.  Denies any chest pain.  No le edema or swelling or pain.  Pt with ems found to have oxygen sats in the upper 80s on his 2 liters at home.  Referred for admission for finding of pna on xray.  Pt feeling better on 4 liters [place] with sats 95%.  Review of Systems: As per HPI otherwise 10 point review of systems negative.   Past Medical History:  Diagnosis Date  . BPH (benign prostatic hyperplasia) 2011  . Congestive heart failure ([place])   . COPD (chronic obstructive pulmonary disease) ([place])   . Depression   . Drug-induced neutropenia ([place]) 10/08/2015  . Encounter for antineoplastic chemotherapy 10/08/2015  . Gastrointestinal obstruction (HCC)    Ileus  . Gout   . Hypertension   . Hypothyroid   . Lung cancer ([place])   . NHL (non-Hodgkin's lymphoma) ([place]) 09/25/2015  . Obesity   . Polycythemia   . Pulmonary hypertension ([place])   . Sepsis ([place]) 01/14/2016  . Streptococcal pneumonia [facility]) August 2009    Past Surgical History:  Procedure Laterality Date  . APPENDECTOMY    . BACK SURGERY    . CARDIOVERSION  11/27/2011   Procedure: CARDIOVERSION;  Surgeon: Thayer Headings, MD;  Location: [place];  Service: Cardiovascular;  Laterality: N/A;  . LUNG CANCER SURGERY  2011     reports that he quit smoking about 15 years ago. His smoking use included cigarettes. He has a 129.00 pack-year smoking  history. he has never used smokeless tobacco. He reports that he does not drink alcohol or use drugs.  No Known Allergies  Family History  Problem Relation Age of Onset  . Heart disease Father   . Cancer Neg Hx   . Diabetes Neg Hx   . Stroke Neg Hx   . Hypertension Neg Hx   . Kidney disease Neg Hx     Prior to Admission medications   Medication Sig Start Date End Date Taking? Authorizing Provider  albuterol (PROVENTIL) (2.5 MG/3ML) 0.083% nebulizer solution INHALE 3 MILLILITERS (2.5 MG) BY NEBULIZATION ROUTE EVERY 8 HOURS AS NEEDED for shortness of breath 08/30/15  Yes [provider]  allopurinol (ZYLOPRIM) 300 MG tablet Take 300 mg by mouth daily. 01/15/17  Yes [provider]  amLODipine (NORVASC) 5 MG tablet Take 1 tablet by mouth Daily. 03/09/11  Yes [provider]  amoxicillin-clavulanate (AUGMENTIN) 875-125 MG tablet Take 1 tablet by mouth 2 (two) times daily as needed (pneumonia).   Yes [provider]  betamethasone dipropionate 0.05 % lotion Apply 1 application topically 2 (two) times daily as needed (psorasis).  01/19/17  Yes [provider]  finasteride (PROSCAR) 5 MG tablet Take 5 mg by mouth daily.   Yes [provider]  levothyroxine (SYNTHROID, LEVOTHROID) 150  MCG tablet Take 150 mcg by mouth daily. 11/07/15  Yes [provider]  lisinopril (PRINIVIL,ZESTRIL) 10 MG tablet Take 1 tablet (10 mg total) by mouth daily. 12/29/11  Yes Nahser, Wonda Cheng, MD  omeprazole (PRILOSEC) 20 MG capsule Take 1 tablet by mouth Twice daily. 07/01/12  Yes [provider]  oxyCODONE-acetaminophen (PERCOCET) 10-325 MG tablet Take 1 tablet by mouth every 4 (four) hours as needed for pain.   Yes [provider]  PROAIR HFA 108 (90 Base) MCG/ACT inhaler Inhale 2 puffs every 6 (six) hours as needed into the lungs for wheezing or shortness of breath. 08/06/17  Yes Parrett, Tammy S, NP  tiotropium (SPIRIVA) 18 MCG inhalation  capsule Place 1 capsule (18 mcg total) daily into inhaler and inhale. 08/06/17  Yes Parrett, Tammy S, NP  lidocaine-prilocaine (EMLA) cream Apply 1 application topically as needed. Patient not taking: Reported on 09/30/2017 09/25/15   Curt Bears, MD    Physical Exam: Vitals:   09/30/17 0421 09/30/17 0630 09/30/17 0645 09/30/17 0730  BP: (!) 158/62 138/66 (!) 123/55 115/62  Pulse: 80 89 66 71  Resp: (!) 22 (!) 21 20 19   Temp: 99.4 F (37.4 C)     TempSrc: Oral     SpO2: 94% 99% 98% 95%      Constitutional: NAD, calm, comfortable Vitals:   09/30/17 0421 09/30/17 0630 09/30/17 0645 09/30/17 0730  BP: (!) 158/62 138/66 (!) 123/55 115/62  Pulse: 80 89 66 71  Resp: (!) 22 (!) 21 20 19   Temp: 99.4 F (37.4 C)     TempSrc: Oral     SpO2: 94% 99% 98% 95%   Eyes: PERRL, lids and conjunctivae normal ENMT: Mucous membranes are moist. Posterior pharynx clear of any exudate or lesions.Normal dentition.  Neck: normal, supple, no masses, no thyromegaly Respiratory: clear to auscultation bilaterally, no wheezing, no crackles. Normal respiratory effort. No accessory muscle use.  Cardiovascular: Regular rate and rhythm, no murmurs / rubs / gallops. No extremity edema. 2+ pedal pulses. No carotid bruits.  Abdomen: no tenderness, no masses palpated. No hepatosplenomegaly. Bowel sounds positive.  Musculoskeletal: no clubbing / cyanosis. No joint deformity upper and lower extremities. Good ROM, no contractures. Normal muscle tone.  Skin: no rashes, lesions, ulcers. No induration Neurologic: CN 2-12 grossly intact. Sensation intact, DTR normal. Strength 5/5 in all 4.  Psychiatric: Normal judgment and insight. Alert and oriented x 3. Normal mood.    Labs on Admission: I have personally reviewed following labs and imaging studies  CBC: Recent Labs  Lab 09/30/17 0421  WBC 18.4*  NEUTROABS 17.2*  HGB 13.2  HCT 40.5  MCV 91.6  PLT 859   Basic Metabolic Panel: Recent Labs  Lab  09/30/17 0421  NA 138  K 4.8  CL 102  CO2 30  GLUCOSE 165*  BUN 18  CREATININE 1.02  CALCIUM 8.9   GFR: CrCl cannot be calculated (Unknown ideal weight.). Liver Function Tests: Recent Labs  Lab 09/30/17 0421  AST 19  ALT 12*  ALKPHOS 78  BILITOT 0.8  PROT 6.8  ALBUMIN 4.0   Recent Labs  Lab 09/30/17 0421  LIPASE 17   No results for input(s): AMMONIA in the last 168 hours. Coagulation Profile: No results for input(s): INR, PROTIME in the last 168 hours. Cardiac Enzymes: Recent Labs  Lab 09/30/17 0421  TROPONINI <0.03   BNP (last 3 results) No results for input(s): PROBNP in the last 8760 hours. HbA1C: No results for input(s): HGBA1C  in the last 72 hours. CBG: No results for input(s): GLUCAP in the last 168 hours. Lipid Profile: No results for input(s): CHOL, HDL, LDLCALC, TRIG, CHOLHDL, LDLDIRECT in the last 72 hours. Thyroid Function Tests: No results for input(s): TSH, T4TOTAL, FREET4, T3FREE, THYROIDAB in the last 72 hours. Anemia Panel: No results for input(s): VITAMINB12, FOLATE, FERRITIN, TIBC, IRON, RETICCTPCT in the last 72 hours. Urine analysis:    Component Value Date/Time   COLORURINE YELLOW 09/30/2017 [place] 09/30/2017 0641   LABSPEC 1.016 09/30/2017 0641   PHURINE 6.0 09/30/2017 0641   GLUCOSEU NEGATIVE 09/30/2017 0641   GLUCOSEU NEGATIVE 12/03/2011 1024   HGBUR SMALL (A) 09/30/2017 0641   BILIRUBINUR NEGATIVE 09/30/2017 0641   KETONESUR NEGATIVE 09/30/2017 0641   PROTEINUR NEGATIVE 09/30/2017 0641   UROBILINOGEN 0.2 12/03/2011 1024   NITRITE NEGATIVE 09/30/2017 0641   LEUKOCYTESUR NEGATIVE 09/30/2017 0641   Sepsis Labs: !!!!!!!!!!!!!!!!!!!!!!!!!!!!!!!!!!!!!!!!!!!! @LABRCNTIP (procalcitonin:4,lacticidven:4) )No results found for this or any previous visit (from the past 240 hour(s)).   Radiological Exams on Admission: Dg Chest 2 View  Result Date: 09/30/2017 CLINICAL DATA:  Shortness of breath EXAM: CHEST  2 VIEW  COMPARISON:  Chest CT 09/07/2017 FINDINGS: There is a power injectable right chest wall Port-A-Cath tip at the cavoatrial junction. Calcification along the pleural surface of the right hemi diaphragmatic dome is unchanged. There is bibasilar atelectasis, right worse than left. No focal airspace consolidation. No pulmonary edema. No pneumothorax or pleural effusion. IMPRESSION: Bibasilar atelectasis, right worse than left. Electronically Signed   By: Ulyses Jarred M.D.   On: 09/30/2017 04:27   Ct Angio Chest Pe W And/or Wo Contrast  Result Date: 09/30/2017 CLINICAL DATA:  67 year old male with shortness of breath. EXAM: CT ANGIOGRAPHY CHEST WITH CONTRAST TECHNIQUE: Multidetector CT imaging of the chest was performed using the standard protocol during bolus administration of intravenous contrast. Multiplanar CT image reconstructions and MIPs were obtained to evaluate the vascular anatomy. CONTRAST:  100 cc Isovue 370 COMPARISON:  Chest radiograph dated 09/30/2017 FINDINGS: Cardiovascular: Borderline cardiomegaly. No pericardial effusion. Coronary vascular calcification primarily involving the LAD. Mild atherosclerotic calcification of the thoracic aorta. No aneurysmal dilatation or evidence of dissection. The origins of the great vessels of the aortic arch are patent as visualized. There is dilatation of the main pulmonary trunk suggestive of pulmonary hypertension. Evaluation of the pulmonary arteries is limited due to suboptimal opacification of the peripheral branches. No large or central pulmonary artery embolus identified. Right-sided Port-A-Cath with tip at the cavoatrial junction. Mediastinum/Nodes: Mildly enlarged right hilar lymph nodes measure approximately 17 mm in short axis. No mediastinal adenopathy. Esophagus is grossly unremarkable. Punctate calcific foci noted in the region of thyroid gland. Lungs/Pleura: Bilateral lower lobe predominant ground-glass and nodular densities concerning for pneumonia.  Clinical correlation is recommended. There is thickening of the walls of the lower lobe bronchi. Mucus secretions noted in the mainstem bronchi extending into the lower lobe bronchi. Aspiration is not excluded. There is a focal area of nodularity in the right upper lobe (series 5, image 101) measuring up to 9 mm, likely inflammatory/infectious or scarring. This is somewhat similar in appearance to the CT of 09/07/2017. There is no pleural effusion or pneumothorax. Right lung base calcified pleural plaque. Upper Abdomen: No acute abnormality. Musculoskeletal: No chest wall abnormality. No acute or significant osseous findings. Review of the MIP images confirms the above findings. IMPRESSION: 1. No CT evidence of large or central pulmonary artery embolus. 2. Bilateral lower lobe predominant ground-glass  and nodular densities likely pneumonia and possibly aspiration. Clinical correlation and follow-up is recommended. In the bronchial mucus content extend from the mainstem bronchi into the lower lobe bronchi bilaterally. 3. Mildly dilated pulmonary trunk suggestive of underlying pulmonary hypertension. 4. Borderline cardiomegaly. Electronically Signed   By: Anner Crete M.D.   On: 09/30/2017 06:42    EKG: Independently reviewed. nsr no acute issues cxr reviewed no edema possible rll infiltrate Old chart reviewed Case discussed with edp   Assessment/Plan 67 yo male with chronic nocturnal use of supplemental oxygen found to have CAP with increase in oxygen needs  Principal Problem:   PNA (pneumonia)- iv rocephin and azithromycin.  Sputum and blood cultures.  Supplemental oxygen prn and wean as tolerates.  Lactic acid level normal. Not toxic or septic.  Active Problems:   Essential hypertension- cont home meds   COPD (chronic obstructive pulmonary disease) (HCC)- cont nebs, no wheezing on exam, lungs clear no steroids at this time   Atrial fibrillation ([place])- currently nsr   Chronic respiratory  failure with hypoxia ([place])- baseline on 2 liters qhs only   NHL (non-Hodgkin's lymphoma) ([place])- in remission    DVT prophylaxis:  Scds, ambulate Code Status:  full Family Communication:  none Disposition Plan:  Per day team Consults called:  none Admission status:  Admission due to hypoxia   Emmauel Hallums A MD [facility]  If 7PM-7AM, please contact night-coverage [email or web address] Password [email or web address]  09/30/2017, 8:09 AM

## 2017-09-30 NOTE — ED Notes (Signed)
Pt complains of being short of breath since about 9pm last night, pt also states that he's having abdominal pain, denies vomiting but complains of nausea

## 2017-10-01 DIAGNOSIS — J9621 Acute and chronic respiratory failure with hypoxia: Secondary | ICD-10-CM

## 2017-10-01 DIAGNOSIS — J441 Chronic obstructive pulmonary disease with (acute) exacerbation: Secondary | ICD-10-CM

## 2017-10-01 DIAGNOSIS — J181 Lobar pneumonia, unspecified organism: Principal | ICD-10-CM

## 2017-10-01 LAB — BASIC METABOLIC PANEL
Anion gap: 2 — ABNORMAL LOW (ref 5–15)
BUN: 19 mg/dL (ref 6–20)
CHLORIDE: 101 mmol/L (ref 101–111)
CO2: 37 mmol/L — ABNORMAL HIGH (ref 22–32)
Calcium: 8.6 mg/dL — ABNORMAL LOW (ref 8.9–10.3)
Creatinine, Ser: 1.03 mg/dL (ref 0.61–1.24)
GFR calc Af Amer: >60 mL/min (ref >60)
GFR calc non Af Amer: >60 mL/min (ref >60)
GLUCOSE: 102 mg/dL — AB (ref 65–99)
POTASSIUM: 4.6 mmol/L (ref 3.5–5.1)
Sodium: 140 mmol/L (ref 135–145)

## 2017-10-01 LAB — CBC WITH DIFFERENTIAL/PLATELET
Basophils Absolute: 0 10*3/uL (ref 0.0–0.1)
Basophils Relative: 0 %
EOS PCT: 2 %
Eosinophils Absolute: 0.2 10*3/uL (ref 0.0–0.7)
HCT: 37.3 % — ABNORMAL LOW (ref 39.0–52.0)
Hemoglobin: 11.5 g/dL — ABNORMAL LOW (ref 13.0–17.0)
LYMPHS ABS: 1.5 10*3/uL (ref 0.7–4.0)
LYMPHS PCT: 16 %
MCH: 29.1 pg (ref 26.0–34.0)
MCHC: 30.8 g/dL (ref 30.0–36.0)
MCV: 94.4 fL (ref 78.0–100.0)
MONO ABS: 0.6 10*3/uL (ref 0.1–1.0)
Monocytes Relative: 7 %
Neutro Abs: 7 10*3/uL (ref 1.7–7.7)
Neutrophils Relative %: 75 %
PLATELETS: 155 10*3/uL (ref 150–400)
RBC: 3.95 MIL/uL — AB (ref 4.22–5.81)
RDW: 14.1 % (ref 11.5–15.5)
WBC: 9.3 10*3/uL (ref 4.0–10.5)

## 2017-10-01 LAB — STREP PNEUMONIAE URINARY ANTIGEN: Strep Pneumo Urinary Antigen: NEGATIVE

## 2017-10-01 LAB — MRSA PCR SCREENING: MRSA by PCR: NEGATIVE

## 2017-10-01 MED ORDER — GUAIFENESIN ER 600 MG PO TB12
600.0000 mg | ORAL_TABLET | Freq: Two times a day (BID) | ORAL | Status: DC
  Administered 2017-10-01 – 2017-10-02 (×2): 600 mg via ORAL
  Filled 2017-10-01 (×2): qty 1

## 2017-10-01 MED ORDER — IPRATROPIUM-ALBUTEROL 0.5-2.5 (3) MG/3ML IN SOLN
3.0000 mL | Freq: Three times a day (TID) | RESPIRATORY_TRACT | Status: DC
  Administered 2017-10-01 – 2017-10-02 (×2): 3 mL via RESPIRATORY_TRACT
  Filled 2017-10-01 (×2): qty 3

## 2017-10-01 MED ORDER — SODIUM CHLORIDE 0.9 % IV SOLN
3.0000 g | Freq: Four times a day (QID) | INTRAVENOUS | Status: DC
  Administered 2017-10-01 – 2017-10-02 (×4): 3 g via INTRAVENOUS
  Filled 2017-10-01 (×6): qty 3

## 2017-10-01 MED ORDER — ALTEPLASE 2 MG IJ SOLR
2.0000 mg | Freq: Once | INTRAMUSCULAR | Status: AC
  Administered 2017-10-01: 2 mg
  Filled 2017-10-01 (×2): qty 2

## 2017-10-01 MED ORDER — PANTOPRAZOLE SODIUM 40 MG IV SOLR
40.0000 mg | Freq: Two times a day (BID) | INTRAVENOUS | Status: DC
  Administered 2017-10-01 – 2017-10-02 (×2): 40 mg via INTRAVENOUS
  Filled 2017-10-01: qty 40

## 2017-10-01 NOTE — Evaluation (Addendum)
Clinical/Bedside Swallow Evaluation Patient Details  Name: [name] MRN: [id number] Date of Birth: October 18, 1950  Today's Date: 10/01/2017 Time: SLP Start Time (ACUTE ONLY): 1520 SLP Stop Time (ACUTE ONLY): 1551 SLP Time Calculation (min) (ACUTE ONLY): 31 min  Past Medical History:  Past Medical History:  Diagnosis Date  . BPH (benign prostatic hyperplasia) 2011  . Congestive heart failure ([place])   . COPD (chronic obstructive pulmonary disease) ([place])   . Depression   . Drug-induced neutropenia ([place]) 10/08/2015  . Encounter for antineoplastic chemotherapy 10/08/2015  . Gastrointestinal obstruction (HCC)    Ileus  . Gout   . Hypertension   . Hypothyroid   . Lung cancer ([place])   . NHL (non-Hodgkin's lymphoma) ([place]) 09/25/2015  . Obesity   . Polycythemia   . Pulmonary hypertension ([place])   . Sepsis ([place]) 01/14/2016  . Streptococcal pneumonia [facility]) August 2009   Past Surgical History:  Past Surgical History:  Procedure Laterality Date  . APPENDECTOMY    . BACK SURGERY    . CARDIOVERSION  11/27/2011   Procedure: CARDIOVERSION;  Surgeon: Thayer Headings, MD;  Location: [place];  Service: Cardiovascular;  Laterality: N/A;  . LUNG CANCER SURGERY  2011   HPI:  67 yo male adm to [facility] with respiratory difficulties.  PMH + for NHL, ileus, COPD, lung cancer, afib, large B cell lymphoma, remote h/o smoking.  Pt with PSH + for lung surgery/lobe removal by Dr [name] in 2011 per pt.  He has reflux and takes a PPI.  Denies any significant issues with coughing/choking during intake or significant reflux prior to this admit.  Pt does states he occasionally senses food and liquids lodge in pharynx but it will clear.  He also admits to significant coughing with "gulping" cold liquids.  Reviewed prior MBS with pt.     Assessment / Plan / Recommendation Clinical Impression  Pt without overt indications of aspiration/severe dysphagia.  CN exam largely unremarkable, slight facial asymmetry noted.   Baseline cough noted without and with intake.  SlP reviewed prior MBS study with pt and provided mitigation strategies verbally and in writing.    MBS april 2017 results "Mild silent asp of thin with sequential swallows.  Barium tablet lodged esophagus without awareness - water needed to clear."    As pt has not had a pna since April 2017 - he appears to be managing his chronic dysphagia.    Advised pt if he has recurrent pnas - recommend consideration for dedicated esophageal evaluation given concerns/h/o suspected esophageal deficits.  Pt however reports no concerns currently with swallowing. Thanks for this consult.   SLP Visit Diagnosis: Dysphagia, unspecified (R13.10)    Aspiration Risk  Mild aspiration risk    Diet Recommendation Regular;Thin liquid   Liquid Administration via: Cup Medication Administration: Whole meds with puree Supervision: Patient able to self feed Compensations: Minimize environmental distractions;Slow rate;Small sips/bites;Clear throat intermittently;Follow solids with liquid Postural Changes: Seated upright at 90 degrees;Remain upright for at least 30 minutes after po intake    Other  Recommendations Oral Care Recommendations: Oral care BID   Follow up Recommendations None      Frequency and Duration   n/a         Prognosis   n/a     Swallow Study   General Date of Onset: 10/01/17 HPI: 67 yo male adm to [facility] with respiratory difficulties.  PMH + for NHL, ileus, COPD, lung cancer, afib, large B cell lymphoma, remote h/o smoking.  Pt with PSH + for lung surgery/lobe removal by Dr [name] in 2011 per pt.  He has reflux and takes a PPI.  Denies any significant issues with coughing/choking during intake or significant reflux prior to this admit.  Pt does states he occasionally senses food and liquids lodge in pharynx but it will clear.  He also admits to significant coughing with "gulping" cold liquids.  Reviewed prior MBS with pt.   Type of Study: Bedside  Swallow Evaluation Previous Swallow Assessment: MBS april 2017- mild silent asp of thin with sequential swallows, barium tablet lodged esophagus without awareness - water needed to clear Diet Prior to this Study: Regular;Thin liquids Temperature Spikes Noted: No Respiratory Status: Room air History of Recent Intubation: No Behavior/Cognition: Alert;Cooperative;Pleasant mood Oral Cavity - Dentition: Adequate natural dentition Vision: Functional for self-feeding Self-Feeding Abilities: Able to feed self Patient Positioning: Upright in bed Baseline Vocal Quality: Normal Volitional Cough: Strong Volitional Swallow: Able to elicit    Oral/Motor/Sensory Function Overall Oral Motor/Sensory Function: (? subtle left facial asymmetry)   Ice Chips Ice chips: Not tested   Thin Liquid Thin Liquid: Within functional limits Presentation: Cup;Straw;Self Fed    Nectar Thick Nectar Thick Liquid: Not tested   Honey Thick Honey Thick Liquid: Not tested   Puree Puree: Within functional limits Presentation: Self Fed;Spoon   Solid   GO   Solid: Within functional limits Presentation: Self Fredirick [name] 10/01/2017,4:22 PM  [name], [place] [facility] SLP [phone]

## 2017-10-01 NOTE — Progress Notes (Signed)
Pharmacy Antibiotic Note  [name] is a 67 y.o. male admitted on 09/30/2017 with recurrent aspiration PNA and AECOPD. Originally started on Rocephin/Zithromax, but now Pharmacy has been consulted for Unasyn dosing.  Plan:  Stop Rocephin/Zithromax  Unasyn 3g IV q6 hr  Pharmacy will sign off and follow peripherally for culture results and renal adjustments  Height: 6' (182.9 cm) Weight: 240 lb 3.2 oz (109 kg) IBW/kg (Calculated) : 77.6  Temp (24hrs), Avg:98.5 F (36.9 C), Min:98.3 F (36.8 C), Max:98.6 F (37 C)  Recent Labs  Lab 09/30/17 0421 09/30/17 0718 09/30/17 0853 10/01/17 0428  WBC 18.4*  --   --  9.3  CREATININE 1.02  --   --  1.03  LATICACIDVEN  --  0.74 0.72  --     Estimated Creatinine Clearance: 88.8 mL/min (by C-G formula based on SCr of 1.03 mg/dL).    No Known Allergies  Antimicrobials this admission: 1/10 R/Z >> 1/11 1/11 Unasyn >>   Dose adjustments this admission: n/a  Microbiology results: 1/10 BCx: ngtd   Thank you for allowing pharmacy to be a part of this patient's care.  Reuel Boom, PharmD, BCPS Pager: [phone] 10/01/2017, 4:05 PM

## 2017-10-01 NOTE — Progress Notes (Addendum)
PROGRESS NOTE  [name] RSW:[id number] DOB: December 07, 1950 DOA: 09/30/2017 PCP: Iva Lento, PA-C  HPI/Recap of past 24 hours:  He is frustrated due to the delay of getting iv abx,  RN reports his port was not functioning, he declined peripheral iv Now port is functioning, RN is getting ready to hung the second dose of abx  He has chronic cough, he states he has been doing this to clear his throat for years This has been stable He came to the hospital due to feeling sob, feeling weak and abdominal pain He denies chest pain, no fever or chills. He reports abdominal pain has resolved.  Assessment/Plan: Principal Problem:   PNA (pneumonia) Active Problems:   Essential hypertension   COPD (chronic obstructive pulmonary disease) (HCC)   Atrial fibrillation (HCC)   Chronic respiratory failure with hypoxia (HCC)   NHL (non-Hodgkin's lymphoma) (HCC)  Acute on chronic hypoxic respiratory failure due to lobar pneumonia (suspect recurrent aspiration pneumonia) and copd exacerbation -H/o Recurrent RML and RLL pna due to dysphagia/aspoiration, he has augmentin at home for this.  -On home o2 at night only at baseline -He presented with sob and abdominal pain, report o2 sats in the 80's on 2liter o2,  wbc 18.4 on presentation. tmax 99.4 on admission. -CTA on presentation ,no PE does show "Bilateral lower lobe predominant ground-glass and nodular densities likely pneumonia and possibly aspiration. Clinical correlation and follow-up is recommended. In the bronchial mucus content extend from the mainstem bronchi into the lower lobe bronchi bilaterally." -he is started on rocephin/zithro on admission, change to unasyn -urine strep pneumoniae antigen negative, blood culture no growth, sputum culture and mrsa screening pending collection. -speech eval.  H/o Acid reflux: Start iv ppi for now  HTN; home meds norvasc and lisinopril continued.  H/o AFIB, s/p cardioversion in 2013 He was  on asa 325 in the past per chart review, it is not on his home meds list this time. Will clarify this with the patient.  Hypothyroidism: on synthroid supplement  Gout: on allopurinol  BPH: on proscar.  H/o DLBCL s/p R-CHOPx6 cycles , last dose in 01/2016 with almost complete response.  in remission per oncology note from 09/09/2017. pre chemo echo in 1/ 2017 ef 09%, grade 1 diastolic dysfunction.  S/p LUL wedge resection for stage 1Asquamous cell lung cancer [name]) December 2011. No recurrence per pulmonology.   Code Status: full  Family Communication: patient   Disposition Plan: home once medically stable   Consultants:  Speech therapist  Procedures:  none  Antibiotics:  Rocephin/zithro from admission to 1/11  unasyn from 1/11   Objective: BP 111/67 (BP Location: Right Arm)   Pulse 60   Temp 98.3 F (36.8 C) (Oral)   Resp 20   Ht 6' (1.829 m)   Wt 109 kg (240 lb 3.2 oz)   SpO2 98%   BMI 32.58 kg/m   Intake/Output Summary (Last 24 hours) at 10/01/2017 0815 Last data filed at 09/30/2017 1720 Gross per 24 hour  Intake 290 ml  Output 2 ml  Net 288 ml   Filed Weights   09/30/17 0907  Weight: 109 kg (240 lb 3.2 oz)    Exam: Patient is examined daily including today on 10/01/2017, exams remain the same as of yesterday except that has changed    General:  NAD  Cardiovascular: RRR  Respiratory: diminished, mild wheezing  Abdomen: Soft/ND/NT, positive BS  Musculoskeletal: No Edema  Neuro: alert, oriented   Data Reviewed: Basic Metabolic  Panel: Recent Labs  Lab 09/30/17 0421 10/01/17 0428  NA 138 140  K 4.8 4.6  CL 102 101  CO2 30 37*  GLUCOSE 165* 102*  BUN 18 19  CREATININE 1.02 1.03  CALCIUM 8.9 8.6*   Liver Function Tests: Recent Labs  Lab 09/30/17 0421  AST 19  ALT 12*  ALKPHOS 78  BILITOT 0.8  PROT 6.8  ALBUMIN 4.0   Recent Labs  Lab 09/30/17 0421  LIPASE 17   No results for input(s): AMMONIA in the last 168  hours. CBC: Recent Labs  Lab 09/30/17 0421 10/01/17 0428  WBC 18.4* 9.3  NEUTROABS 17.2* 7.0  HGB 13.2 11.5*  HCT 40.5 37.3*  MCV 91.6 94.4  PLT 171 155   Cardiac Enzymes:   Recent Labs  Lab 09/30/17 0421  TROPONINI <0.03   BNP (last 3 results) No results for input(s): BNP in the last 8760 hours.  ProBNP (last 3 results) No results for input(s): PROBNP in the last 8760 hours.  CBG: No results for input(s): GLUCAP in the last 168 hours.  No results found for this or any previous visit (from the past 240 hour(s)).   Studies: No results found.  Scheduled Meds: . allopurinol  300 mg Oral Daily  . amLODipine  5 mg Oral Daily  . finasteride  5 mg Oral Daily  . levothyroxine  150 mcg Oral QAC breakfast  . lisinopril  10 mg Oral Daily  . sodium chloride flush  3 mL Intravenous Q12H  . tiotropium  18 mcg Inhalation Daily    Continuous Infusions: . sodium chloride    . azithromycin    . cefTRIAXone (ROCEPHIN)  IV       Time spent: 35 mins  I have personally reviewed and interpreted on  10/01/2017 daily labs, tele strips, imagings as discussed above under date review session and assessment and plans.  I reviewed all nursing notes, pharmacy notes,  vitals, pertinent old records  I have discussed plan of care as described above with RN , patient on 10/01/2017   Florencia Reasons MD, PhD  [facility] Pager [phone]. If 7PM-7AM, please contact night-coverage at [email or web address], password [facility] 10/01/2017, 8:15 AM  LOS: 1 day

## 2017-10-01 NOTE — Progress Notes (Signed)
Patient concerned about slight bleeding and irritation in nose. Humidification added to nasal cannula oxygen. Oxygen flow at 2L [place], oxygen saturation at 98%.

## 2017-10-02 DIAGNOSIS — I4891 Unspecified atrial fibrillation: Secondary | ICD-10-CM

## 2017-10-02 LAB — CBC WITH DIFFERENTIAL/PLATELET
BASOS ABS: 0 10*3/uL (ref 0.0–0.1)
BASOS PCT: 0 %
EOS PCT: 3 %
Eosinophils Absolute: 0.2 10*3/uL (ref 0.0–0.7)
HCT: 34.7 % — ABNORMAL LOW (ref 39.0–52.0)
Hemoglobin: 11 g/dL — ABNORMAL LOW (ref 13.0–17.0)
LYMPHS PCT: 18 %
Lymphs Abs: 1.3 10*3/uL (ref 0.7–4.0)
MCH: 29.6 pg (ref 26.0–34.0)
MCHC: 31.7 g/dL (ref 30.0–36.0)
MCV: 93.3 fL (ref 78.0–100.0)
Monocytes Absolute: 0.4 10*3/uL (ref 0.1–1.0)
Monocytes Relative: 6 %
Neutro Abs: 5 10*3/uL (ref 1.7–7.7)
Neutrophils Relative %: 73 %
Platelets: 158 10*3/uL (ref 150–400)
RBC: 3.72 MIL/uL — AB (ref 4.22–5.81)
RDW: 13.9 % (ref 11.5–15.5)
WBC: 7 10*3/uL (ref 4.0–10.5)

## 2017-10-02 LAB — BASIC METABOLIC PANEL
ANION GAP: 5 (ref 5–15)
BUN: 16 mg/dL (ref 6–20)
CALCIUM: 8.5 mg/dL — AB (ref 8.9–10.3)
CO2: 35 mmol/L — ABNORMAL HIGH (ref 22–32)
Chloride: 100 mmol/L — ABNORMAL LOW (ref 101–111)
Creatinine, Ser: 0.85 mg/dL (ref 0.61–1.24)
GLUCOSE: 87 mg/dL (ref 65–99)
POTASSIUM: 4.1 mmol/L (ref 3.5–5.1)
SODIUM: 140 mmol/L (ref 135–145)

## 2017-10-02 LAB — EXPECTORATED SPUTUM ASSESSMENT W GRAM STAIN, RFLX TO RESP C

## 2017-10-02 LAB — MAGNESIUM: MAGNESIUM: 1.9 mg/dL (ref 1.7–2.4)

## 2017-10-02 LAB — EXPECTORATED SPUTUM ASSESSMENT W REFEX TO RESP CULTURE

## 2017-10-02 MED ORDER — GUAIFENESIN ER 600 MG PO TB12: 600.0000 mg | ORAL_TABLET | Freq: Two times a day (BID) | ORAL | 0 refills | Status: DC

## 2017-10-02 MED ORDER — ALBUTEROL SULFATE (2.5 MG/3ML) 0.083% IN NEBU: 2.5000 mg | INHALATION_SOLUTION | RESPIRATORY_TRACT | 3 refills | Status: DC | PRN

## 2017-10-02 MED ORDER — PROAIR HFA 108 (90 BASE) MCG/ACT IN AERS: 2.0000 | INHALATION_SPRAY | Freq: Four times a day (QID) | RESPIRATORY_TRACT | 1 refills | Status: DC | PRN

## 2017-10-02 MED ORDER — HEPARIN SOD (PORK) LOCK FLUSH 100 UNIT/ML IV SOLN
500.0000 [IU] | Freq: Once | INTRAVENOUS | Status: AC
  Administered 2017-10-02: 500 [IU] via INTRAVENOUS
  Filled 2017-10-02: qty 5

## 2017-10-02 MED ORDER — AMOXICILLIN-POT CLAVULANATE 875-125 MG PO TABS: 1.0000 | ORAL_TABLET | Freq: Two times a day (BID) | ORAL | 0 refills | Status: AC | PRN

## 2017-10-02 NOTE — Progress Notes (Signed)
Pt. discharged to home with family, left via wheelchair. No respiratory distress noted.

## 2017-10-02 NOTE — Progress Notes (Signed)
Discharge teaching completed with teach back. Pt. to pick up prescriptions at [facility]. Pt. Understands to call for follow up appointments. Discharge instructions given and reviewed with pt.

## 2017-10-02 NOTE — Discharge Summary (Signed)
Discharge Summary  [name] IWP:[id number] DOB: 1950/11/10  PCP: Iva Lento, PA-C  Admit date: 09/30/2017 Discharge date: 10/02/2017  Time spent: <58mins  Recommendations for Outpatient Follow-up:  1. F/u with PMD within a week  for hospital discharge follow up, repeat cbc/bmp at follow up 2. F/u with pulmonology in two weeks for copd and recurrent aspiration pneumonia. Pulmonology to follow up on final sputum culture result. 3. F/u with cardiology for h/o afib  Discharge Diagnoses:  Active Hospital Problems   Diagnosis Date Noted  . PNA (pneumonia) 09/30/2017  . Lobar pneumonia ([facility])   . Acute on chronic respiratory failure with hypoxia ([place])   . COPD with acute exacerbation ([place])   . NHL (non-Hodgkin's lymphoma) ([place]) 09/25/2015  . Chronic respiratory failure with hypoxia ([place]) 08/27/2015  . Atrial fibrillation ([place]) 10/26/2011  . COPD (chronic obstructive pulmonary disease) ([place]) 06/05/2008  . Essential hypertension 06/05/2008    Resolved Hospital Problems  No resolved problems to display.    Discharge Condition: stable  Diet recommendation: heart healthy  Filed Weights   09/30/17 0907  Weight: 109 kg (240 lb 3.2 oz)    History of present illness: (per admitting MD Dr Shanon Brow) PCP: Iva Lento, PA-C  Patient coming from:  home  Chief Complaint:  sob  HPI: [name] is a 67 y.o. male with medical history significant of NHL in remission for a year, copd, CRF on qhs oxygen at 2 liters [place], HTN comes in with sob for one day.  He was sob all night despite his oxygen he sleeps with.  Denies fevers, chills, cough.  No n/v/d.  He has not been on antibiotics for over 6 months.  He called his PCP who told him to take some augmentin he had last night, he has only had one dose of that.  Denies any chest pain.  No le edema or swelling or pain.  Pt with ems found to have oxygen sats in the upper 80s on his 2 liters at home.  Referred for admission for  finding of pna on xray.  Pt feeling better on 4 liters [place] with sats 95%.     Hospital Course:  Principal Problem:   PNA (pneumonia) Active Problems:   Essential hypertension   COPD (chronic obstructive pulmonary disease) ([facility])   Atrial fibrillation (H[facility])   Chronic respiratory failure with hypoxia ([facility])   NHL (non-Hodgkin's lymphoma) (H[facility])   Lobar pneumonia ([facility])   Acute on chronic respiratory failure with hypoxia (H[facility])   COPD with acute exacerbation ([facility])   Acute on chronic hypoxic respiratory failure due to lobar pneumonia (suspect recurrent aspiration pneumonia) and copd exacerbation -H/o Recurrent RML and RLL pna due to dysphagia/aspoiration, he has augmentin at home for this.  -On home o2 at night only at baseline -He presented with sob and abdominal pain, report o2 sats in the 80's on 2liter o2,  wbc 18.4 on presentation. tmax 99.4 on admission. -CTA on presentation ,no PE does show "Bilateral lower lobe predominant ground-glass and nodular densities likely pneumonia and possibly aspiration. Clinical correlation and follow-up is recommended. In the bronchial mucus content extend from the mainstem bronchi into the lower lobe bronchi bilaterally." -he is started on rocephin/zithro on admission, changed to unasyn -urine strep pneumoniae antigen negative, blood culture no growth, sputum culture collected prior to discharge. Pulmonology for follow on final result -mrsa screening negative. -speech eval. "Regular;Thin liquid " "Liquid Administration via: Cup Medication Administration: Whole meds with puree Supervision: Patient able to  self feed Compensations: Minimize environmental distractions;Slow rate;Small sips/bites;Clear throat intermittently;Follow solids with liquid Postural Changes: Seated upright at 90 degrees;Remain upright for at least 30 minutes after po intake  -he has improved, symptom resolved, sbc normalized, lung exam improved, he is discharged on augmentin and  pulmonology follow up.   H/o Acid reflux: Treated with iv ppi in the hospital, resume home ppi at discharge.  HTN; home meds norvasc and lisinopril continued.  H/o AFIB, s/p cardioversion in 2013 He is in sinus rhythm while in the hospital. He was on asa 325 in the past per chart review, it is not on his home meds list this time.  I have discussed this with the patient, patient is to follow up with cardiology for further management.  Hypothyroidism: on synthroid supplement  Gout: on allopurinol  BPH: on proscar.  H/o DLBCL s/p R-CHOPx6 cycles , last dose in 01/2016 with almost complete response.  in remission per oncology note from 09/09/2017. pre chemo echo in 1/ 2017 ef 14%, grade 1 diastolic dysfunction.  S/p LUL wedge resection for stage 1Asquamous cell lung cancer [name]) December 2011. No recurrence per pulmonology.   Code Status: full  Family Communication: patient and wife at bedside  Disposition Plan: home once medically stable   Consultants:  Speech therapist  Procedures:  none  Antibiotics:  Rocephin/zithro from admission to 1/11  unasyn from 1/11   Discharge Exam: BP 132/66 (BP Location: Left Arm)   Pulse (!) 58   Temp 98.1 F (36.7 C) (Oral)   Resp 16   Ht 6' (1.829 m)   Wt 109 kg (240 lb 3.2 oz)   SpO2 92% Comment: after ambulation in the hallway  BMI 32.58 kg/m   General: NAD Cardiovascular: RRR Respiratory: improved aeration, wheezing has resolved Abdomen: Soft/ND/NT, positive BS Musculoskeletal: No Edema Neuro: alert, oriented      Discharge Instructions You were cared for by a hospitalist during your hospital stay. If you have any questions about your discharge medications or the care you received while you were in the hospital after you are discharged, you can call the unit and asked to speak with the hospitalist on call if the hospitalist that took care of you is not available. Once you are discharged, your  primary care physician will handle any further medical issues. Please note that NO REFILLS for any discharge medications will be authorized once you are discharged, as it is imperative that you return to your primary care physician (or establish a relationship with a primary care physician if you do not have one) for your aftercare needs so that they can reassess your need for medications and monitor your lab values.  Discharge Instructions    Diet - low sodium heart healthy   Complete by:  As directed    Increase activity slowly   Complete by:  As directed      Allergies as of 10/02/2017   No Known Allergies     Medication List    STOP taking these medications   lidocaine-prilocaine cream Commonly known as:  EMLA     TAKE these medications   albuterol (2.5 MG/3ML) 0.083% nebulizer solution Commonly known as:  PROVENTIL Take 3 mLs (2.5 mg total) by nebulization every 4 (four) hours as needed for wheezing or shortness of breath. What changed:  See the new instructions.   PROAIR HFA 108 (90 Base) MCG/ACT inhaler Generic drug:  albuterol Inhale 2 puffs into the lungs every 6 (six) hours as needed for  wheezing or shortness of breath. What changed:  Another medication with the same name was changed. Make sure you understand how and when to take each.   allopurinol 300 MG tablet Commonly known as:  ZYLOPRIM Take 300 mg by mouth daily.   amLODipine 5 MG tablet Commonly known as:  NORVASC Take 1 tablet by mouth Daily.   amoxicillin-clavulanate 875-125 MG tablet Commonly known as:  AUGMENTIN Take 1 tablet by mouth 2 (two) times daily as needed for up to 7 days (pneumonia).   betamethasone dipropionate 0.05 % lotion Apply 1 application topically 2 (two) times daily as needed (psorasis).   finasteride 5 MG tablet Commonly known as:  PROSCAR Take 5 mg by mouth daily.   guaiFENesin 600 MG 12 hr tablet Commonly known as:  MUCINEX Take 1 tablet (600 mg total) by mouth 2 (two)  times daily.   levothyroxine 150 MCG tablet Commonly known as:  SYNTHROID, LEVOTHROID Take 150 mcg by mouth daily.   lisinopril 10 MG tablet Commonly known as:  PRINIVIL,ZESTRIL Take 1 tablet (10 mg total) by mouth daily.   omeprazole 20 MG capsule Commonly known as:  PRILOSEC Take 1 tablet by mouth Twice daily.   oxyCODONE-acetaminophen 10-325 MG tablet Commonly known as:  PERCOCET Take 1 tablet by mouth every 4 (four) hours as needed for pain.   tiotropium 18 MCG inhalation capsule Commonly known as:  SPIRIVA Place 1 capsule (18 mcg total) daily into inhaler and inhale.      No Known Allergies Follow-up Information    Iva Lento, PA-C. Schedule an appointment as soon as possible for a visit on 10/06/2017.   Specialty:  Internal Medicine Why:  for hospital discharge follow up Contact information: [place] [address] [phone]        Brand Males, MD Follow up in 2 week(s).   Specialty:  Pulmonary Disease Why:  for copd and aspiration pneumonia  Contact information: [address] [place] [address] [phone]        Nahser, Wonda Cheng, MD Follow up in 3 week(s).   Specialty:  Cardiology Why:  for history of afib Contact information: [place] [address] [phone]            The results of significant diagnostics from this hospitalization (including imaging, microbiology, ancillary and laboratory) are listed below for reference.    Significant Diagnostic Studies: Dg Chest 2 View  Result Date: 09/30/2017 CLINICAL DATA:  Shortness of breath EXAM: CHEST  2 VIEW COMPARISON:  Chest CT 09/07/2017 FINDINGS: There is a power injectable right chest wall Port-A-Cath tip at the cavoatrial junction. Calcification along the pleural surface of the right hemi diaphragmatic dome is unchanged. There is bibasilar atelectasis, right worse than left. No focal airspace consolidation. No pulmonary edema.  No pneumothorax or pleural effusion. IMPRESSION: Bibasilar atelectasis, right worse than left. Electronically Signed   By: Ulyses Jarred M.D.   On: 09/30/2017 04:27   Ct Chest W Contrast  Result Date: 09/08/2017 CLINICAL DATA:  Chest lymphoma.  Restaging. EXAM: CT CHEST, ABDOMEN, AND PELVIS WITH CONTRAST TECHNIQUE: Multidetector CT imaging of the chest, abdomen and pelvis was performed following the standard protocol during bolus administration of intravenous contrast. CONTRAST:  173mL ISOVUE-300 IOPAMIDOL (ISOVUE-300) INJECTION 61% COMPARISON:  08/27/2016 FINDINGS: CT CHEST FINDINGS Cardiovascular: The heart size is normal. No pericardial effusion. Coronary artery calcification is evident. Atherosclerotic calcification is noted in the wall of the thoracic aorta. Right Port-A-Cath  tip is positioned in the mid SVC. Mediastinum/Nodes: 12 mm short axis subcarinal lymph node is stable. Otherwise no mediastinal lymphadenopathy. There is no hilar lymphadenopathy. The esophagus has normal imaging features. There is no axillary lymphadenopathy. Lungs/Pleura: Scarring posterior right upper lobe is similar to prior. Suture line left apex with adjacent scarring is unchanged. Atelectasis noted at the posterior lung bases. Musculoskeletal: Bone windows reveal no worrisome lytic or sclerotic osseous lesions. CT ABDOMEN PELVIS FINDINGS Hepatobiliary: No focal abnormality within the liver parenchyma. There is no evidence for gallstones, gallbladder wall thickening, or pericholecystic fluid. No intrahepatic or extrahepatic biliary dilation. Pancreas: No focal mass lesion. No dilatation of the main duct. No intraparenchymal cyst. No peripancreatic edema. Spleen: Slight interval decrease in size of the hypoattenuating lesion anterior spleen measuring 3.3 x 4.2 cm today compared to 3.7 x 4.7 cm previously. Adrenals/Urinary Tract: Similar 11 mm right adrenal nodule. Left adrenal gland unremarkable. 2-3 mm nonobstructing stone  identified upper pole right kidney with another 2 x 4 mm stone in the lower pole. 5 cm right renal cyst is stable. Tiny cyst upper pole left kidney is similar to prior. Dominant 8.6 cm cyst in the lower pole left kidney is similar. 2 mm nonobstructing interpolar left renal stone evident. Additional scattered tiny hypoattenuating left renal lesions are likely cysts. Mild distention noted in the mid ureter bilaterally without overt hydroureter. The urinary bladder appears normal for the degree of distention. Stomach/Bowel: Stomach is nondistended. No gastric wall thickening. No evidence of outlet obstruction. Duodenum is normally positioned as is the ligament of Treitz. No small bowel wall thickening. No small bowel dilatation. The terminal ileum is normal. The appendix is not visualized, but there is no edema or inflammation in the region of the cecum. Diverticular changes are noted in the left colon without evidence of diverticulitis. Vascular/Lymphatic: There is abdominal aortic atherosclerosis without aneurysm. Small lymph nodes in the hepatoduodenal ligament and retroperitoneal space are similar. Index 10 mm short axis left para-aortic lymph node measured on the prior study is 7 mm short axis today. No pelvic sidewall lymphadenopathy. Reproductive: Dystrophic calcification noted in the central prostate gland. Other: No intraperitoneal free fluid. Musculoskeletal: Small lucency in the left iliac crest is unchanged as is the tiny sclerotic focus seen anteriorly. No new or progressive lytic or sclerotic osseous abnormality is evident. IMPRESSION: 1. Stable exam.  No new or progressive interval findings. 2. Tiny bilateral nonobstructing renal stones with similar appearance of bilateral renal cysts. 3. Left colonic diverticulosis without diverticulitis. 4. Hypoattenuating splenic lesion measures slightly smaller on today's study. 5.  Aortic Atherosclerois (ICD10-170.0) Electronically Signed   By: Misty Stanley M.D.    On: 09/08/2017 09:45   Ct Angio Chest Pe W And/or Wo Contrast  Result Date: 09/30/2017 CLINICAL DATA:  67 year old male with shortness of breath. EXAM: CT ANGIOGRAPHY CHEST WITH CONTRAST TECHNIQUE: Multidetector CT imaging of the chest was performed using the standard protocol during bolus administration of intravenous contrast. Multiplanar CT image reconstructions and MIPs were obtained to evaluate the vascular anatomy. CONTRAST:  100 cc Isovue 370 COMPARISON:  Chest radiograph dated 09/30/2017 FINDINGS: Cardiovascular: Borderline cardiomegaly. No pericardial effusion. Coronary vascular calcification primarily involving the LAD. Mild atherosclerotic calcification of the thoracic aorta. No aneurysmal dilatation or evidence of dissection. The origins of the great vessels of the aortic arch are patent as visualized. There is dilatation of the main pulmonary trunk suggestive of pulmonary hypertension. Evaluation of the pulmonary arteries is limited due to suboptimal opacification of  the peripheral branches. No large or central pulmonary artery embolus identified. Right-sided Port-A-Cath with tip at the cavoatrial junction. Mediastinum/Nodes: Mildly enlarged right hilar lymph nodes measure approximately 17 mm in short axis. No mediastinal adenopathy. Esophagus is grossly unremarkable. Punctate calcific foci noted in the region of thyroid gland. Lungs/Pleura: Bilateral lower lobe predominant ground-glass and nodular densities concerning for pneumonia. Clinical correlation is recommended. There is thickening of the walls of the lower lobe bronchi. Mucus secretions noted in the mainstem bronchi extending into the lower lobe bronchi. Aspiration is not excluded. There is a focal area of nodularity in the right upper lobe (series 5, image 101) measuring up to 9 mm, likely inflammatory/infectious or scarring. This is somewhat similar in appearance to the CT of 09/07/2017. There is no pleural effusion or pneumothorax. Right  lung base calcified pleural plaque. Upper Abdomen: No acute abnormality. Musculoskeletal: No chest wall abnormality. No acute or significant osseous findings. Review of the MIP images confirms the above findings. IMPRESSION: 1. No CT evidence of large or central pulmonary artery embolus. 2. Bilateral lower lobe predominant ground-glass and nodular densities likely pneumonia and possibly aspiration. Clinical correlation and follow-up is recommended. In the bronchial mucus content extend from the mainstem bronchi into the lower lobe bronchi bilaterally. 3. Mildly dilated pulmonary trunk suggestive of underlying pulmonary hypertension. 4. Borderline cardiomegaly. Electronically Signed   By: Anner Crete M.D.   On: 09/30/2017 06:42   Ct Abdomen Pelvis W Contrast  Result Date: 09/08/2017 CLINICAL DATA:  Chest lymphoma.  Restaging. EXAM: CT CHEST, ABDOMEN, AND PELVIS WITH CONTRAST TECHNIQUE: Multidetector CT imaging of the chest, abdomen and pelvis was performed following the standard protocol during bolus administration of intravenous contrast. CONTRAST:  164mL ISOVUE-300 IOPAMIDOL (ISOVUE-300) INJECTION 61% COMPARISON:  08/27/2016 FINDINGS: CT CHEST FINDINGS Cardiovascular: The heart size is normal. No pericardial effusion. Coronary artery calcification is evident. Atherosclerotic calcification is noted in the wall of the thoracic aorta. Right Port-A-Cath tip is positioned in the mid SVC. Mediastinum/Nodes: 12 mm short axis subcarinal lymph node is stable. Otherwise no mediastinal lymphadenopathy. There is no hilar lymphadenopathy. The esophagus has normal imaging features. There is no axillary lymphadenopathy. Lungs/Pleura: Scarring posterior right upper lobe is similar to prior. Suture line left apex with adjacent scarring is unchanged. Atelectasis noted at the posterior lung bases. Musculoskeletal: Bone windows reveal no worrisome lytic or sclerotic osseous lesions. CT ABDOMEN PELVIS FINDINGS Hepatobiliary:  No focal abnormality within the liver parenchyma. There is no evidence for gallstones, gallbladder wall thickening, or pericholecystic fluid. No intrahepatic or extrahepatic biliary dilation. Pancreas: No focal mass lesion. No dilatation of the main duct. No intraparenchymal cyst. No peripancreatic edema. Spleen: Slight interval decrease in size of the hypoattenuating lesion anterior spleen measuring 3.3 x 4.2 cm today compared to 3.7 x 4.7 cm previously. Adrenals/Urinary Tract: Similar 11 mm right adrenal nodule. Left adrenal gland unremarkable. 2-3 mm nonobstructing stone identified upper pole right kidney with another 2 x 4 mm stone in the lower pole. 5 cm right renal cyst is stable. Tiny cyst upper pole left kidney is similar to prior. Dominant 8.6 cm cyst in the lower pole left kidney is similar. 2 mm nonobstructing interpolar left renal stone evident. Additional scattered tiny hypoattenuating left renal lesions are likely cysts. Mild distention noted in the mid ureter bilaterally without overt hydroureter. The urinary bladder appears normal for the degree of distention. Stomach/Bowel: Stomach is nondistended. No gastric wall thickening. No evidence of outlet obstruction. Duodenum is normally positioned as is  the ligament of Treitz. No small bowel wall thickening. No small bowel dilatation. The terminal ileum is normal. The appendix is not visualized, but there is no edema or inflammation in the region of the cecum. Diverticular changes are noted in the left colon without evidence of diverticulitis. Vascular/Lymphatic: There is abdominal aortic atherosclerosis without aneurysm. Small lymph nodes in the hepatoduodenal ligament and retroperitoneal space are similar. Index 10 mm short axis left para-aortic lymph node measured on the prior study is 7 mm short axis today. No pelvic sidewall lymphadenopathy. Reproductive: Dystrophic calcification noted in the central prostate gland. Other: No intraperitoneal free  fluid. Musculoskeletal: Small lucency in the left iliac crest is unchanged as is the tiny sclerotic focus seen anteriorly. No new or progressive lytic or sclerotic osseous abnormality is evident. IMPRESSION: 1. Stable exam.  No new or progressive interval findings. 2. Tiny bilateral nonobstructing renal stones with similar appearance of bilateral renal cysts. 3. Left colonic diverticulosis without diverticulitis. 4. Hypoattenuating splenic lesion measures slightly smaller on today's study. 5.  Aortic Atherosclerois (ICD10-170.0) Electronically Signed   By: Misty Stanley M.D.   On: 09/08/2017 09:45    Microbiology: Recent Results (from the past 240 hour(s))  Blood culture (routine x 2)     Status: None (Preliminary result)   Collection Time: 09/30/17  7:00 AM  Result Value Ref Range Status   Specimen Description BLOOD LEFT ANTECUBITAL  Final   Special Requests   Final    BOTTLES DRAWN AEROBIC AND ANAEROBIC Blood Culture adequate volume   Culture   Final    NO GROWTH 2 DAYS Performed at [facility], [address] [place], [place] [address]    Report Status PENDING  Incomplete  Blood culture (routine x 2)     Status: None (Preliminary result)   Collection Time: 09/30/17  7:07 AM  Result Value Ref Range Status   Specimen Description BLOOD RIGHT ANTECUBITAL  Final   Special Requests   Final    BOTTLES DRAWN AEROBIC AND ANAEROBIC Blood Culture adequate volume   Culture   Final    NO GROWTH 2 DAYS Performed at [facility], [place] [address], [place], [place] [address]    Report Status PENDING  Incomplete  MRSA PCR Screening     Status: None   Collection Time: 10/01/17  4:22 PM  Result Value Ref Range Status   MRSA by PCR NEGATIVE NEGATIVE Final    Comment:        The GeneXpert MRSA Assay (FDA approved for NASAL specimens only), is one component of a comprehensive MRSA colonization surveillance program. It is not intended to diagnose MRSA infection nor to guide or monitor  treatment for MRSA infections.      Labs: Basic Metabolic Panel: Recent Labs  Lab 09/30/17 0421 10/01/17 0428 10/02/17 0500  NA 138 140 140  K 4.8 4.6 4.1  CL 102 101 100*  CO2 30 37* 35*  GLUCOSE 165* 102* 87  BUN 18 19 16   CREATININE 1.02 1.03 0.85  CALCIUM 8.9 8.6* 8.5*  MG  --   --  1.9   Liver Function Tests: Recent Labs  Lab 09/30/17 0421  AST 19  ALT 12*  ALKPHOS 78  BILITOT 0.8  PROT 6.8  ALBUMIN 4.0   Recent Labs  Lab 09/30/17 0421  LIPASE 17   No results for input(s): AMMONIA in the last 168 hours. CBC: Recent Labs  Lab 09/30/17 0421 10/01/17 0428 10/02/17 0500  WBC  18.4* 9.3 7.0  NEUTROABS 17.2* 7.0 5.0  HGB 13.2 11.5* 11.0*  HCT 40.5 37.3* 34.7*  MCV 91.6 94.4 93.3  PLT 171 155 158   Cardiac Enzymes: Recent Labs  Lab 09/30/17 0421  TROPONINI <0.03   BNP: BNP (last 3 results) No results for input(s): BNP in the last 8760 hours.  ProBNP (last 3 results) No results for input(s): PROBNP in the last 8760 hours.  CBG: No results for input(s): GLUCAP in the last 168 hours.     Signed:  Florencia Reasons MD, PhD  [facility] 10/02/2017, 12:30 PM

## 2017-10-04 LAB — CULTURE, RESPIRATORY W GRAM STAIN: Culture: NORMAL

## 2017-10-04 LAB — CULTURE, RESPIRATORY

## 2017-10-05 LAB — CULTURE, BLOOD (ROUTINE X 2)
Culture: NO GROWTH
Culture: NO GROWTH
SPECIAL REQUESTS: ADEQUATE
Special Requests: ADEQUATE

## 2017-10-06 DIAGNOSIS — Z79899 Other long term (current) drug therapy: Secondary | ICD-10-CM | POA: Diagnosis not present

## 2017-10-06 DIAGNOSIS — K219 Gastro-esophageal reflux disease without esophagitis: Secondary | ICD-10-CM | POA: Diagnosis not present

## 2017-10-06 DIAGNOSIS — G8929 Other chronic pain: Secondary | ICD-10-CM | POA: Diagnosis not present

## 2017-10-06 DIAGNOSIS — M545 Low back pain: Secondary | ICD-10-CM | POA: Diagnosis not present

## 2017-10-21 ENCOUNTER — Ambulatory Visit (INDEPENDENT_AMBULATORY_CARE_PROVIDER_SITE_OTHER)
Admission: RE | Admit: 2017-10-21 | Discharge: 2017-10-21 | Disposition: A | Discharge status: Home / Self Care | Payer: PPO | Source: Ambulatory Visit | Attending: Acute Care | Admitting: Acute Care

## 2017-10-21 ENCOUNTER — Encounter: Payer: Self-pay | Admitting: Acute Care

## 2017-10-21 ENCOUNTER — Ambulatory Visit: Payer: PPO | Admitting: Acute Care

## 2017-10-21 VITALS — BP 122/82 | HR 81 | Ht 72.0 in | Wt 241.8 lb

## 2017-10-21 DIAGNOSIS — J449 Chronic obstructive pulmonary disease, unspecified: Secondary | ICD-10-CM | POA: Diagnosis not present

## 2017-10-21 DIAGNOSIS — J181 Lobar pneumonia, unspecified organism: Secondary | ICD-10-CM

## 2017-10-21 DIAGNOSIS — J9611 Chronic respiratory failure with hypoxia: Secondary | ICD-10-CM

## 2017-10-21 DIAGNOSIS — R05 Cough: Secondary | ICD-10-CM | POA: Diagnosis not present

## 2017-10-21 DIAGNOSIS — J189 Pneumonia, unspecified organism: Secondary | ICD-10-CM

## 2017-10-21 MED ORDER — OMEPRAZOLE 20 MG PO CPDR: 20.0000 mg | DELAYED_RELEASE_CAPSULE | Freq: Two times a day (BID) | ORAL | 1 refills | Status: DC

## 2017-10-21 MED ORDER — DOXYCYCLINE HYCLATE 100 MG PO TABS: 100.0000 mg | ORAL_TABLET | Freq: Two times a day (BID) | ORAL | 0 refills | Status: DC

## 2017-10-21 NOTE — Assessment & Plan Note (Signed)
Continue nocturnal oxygen at 2 L [place]

## 2017-10-21 NOTE — Assessment & Plan Note (Signed)
Resolving but not clear Plan: We will prescribe doxycycline 100 mg twice daily x 7 days. Please take probiotic with antibiotic. ( Culturelle or Align) one once daily while on antibiotics. Mucinex 1200 mg once daily with a full glass of water. Follow up in 1 month with Sarah or Dr. Chase Caller to ensure you are better Follow up with Dr. Earlie Server as you have been doing. Note your daily symptoms > remember "red flags" for COPD:  Increase in cough, increase in sputum production, increase in shortness of breath or activity tolerance. If you notice these symptoms, please call to be seen.   Please contact office for sooner follow up if symptoms do not improve or worsen or seek emergency care

## 2017-10-21 NOTE — Progress Notes (Signed)
History of Present Illness [name] is a 67 y.o. male with Gold 3 COPD,on oxygen, with history of Squamous cell Lung Cancer 2011 -s/p LUL resection. He is followed by Dr. Chase Caller.  Maintenance medication: Spiriva  10/21/2017 Hospital Follow up: Admission 09/30/2017-10/02/2017 Jose A Pierceis a 67 y.o.malewith medical history significant ofNHL in remission for a year, copd, CRF on qhs oxygen at 2 liters [place], HTN comes in with sob for one day. He was sob all night despite his oxygen he sleeps with. Denies fevers, chills, cough. No n/v/d. He has not been on antibiotics for over 6 months. He called his PCP who told him to take some augmentin he had last night, he has only had one dose of that. Denies any chest pain. No le edema or swelling or pain. Pt with ems found to have oxygen sats in the upper 80s on his 2 liters at home. Referred for admission for finding of pna on xray. H/oRecurrent RML and RLL pna due to dysphagia/aspoiration, He was treated with rocephin/ azithro  on admission, changed to Unasyn.   Antibiotics:  Rocephin/zithrofrom admission to 1/11  unasyn from 1/11  Discharged on Augmentin x 10 days  Pt. Presents for follow up. He states he is feeling better.He completed his Augmentin post discharge. He is compliant with his Gwynneth Munson and uses his rescue inhaler approx. Once daily.He states he only uses the nebs when he is sick. Sputum is yellow and thick.He denies fever, chest pain, orthopnea or hemoptysis. He  has cough, he states he is compliant with his Prilosec. He is on ACE therapy. He feels he is back to his baseline status. He is compliant with his nocturnal oxygen ar 2 L at night.We reviewed his flare symptoms, encouraging him to come in early if he developers any of them.   Test Results: CXR 10/21/2017>>IMPRESSION: Improved bibasilar aeration. Cannot exclude mild residual left lower lobe pneumonia. Hyperinflation. Right-sided pleural calcifications,  consistent with prior empyema or Hemothorax.  FEV1 1.66 L/45% post-dilator in March 2017 and DLCO 13.96/39% Large B cell non Hodgkin lymphoma dx 2016 [facility]  CBC Latest Ref Rng & Units 10/02/2017 10/01/2017 09/30/2017  WBC 4.0 - 10.5 K/uL 7.0 9.3 18.4(H)  Hemoglobin 13.0 - 17.0 g/dL 11.0(L) 11.5(L) 13.2  Hematocrit 39.0 - 52.0 % 34.7(L) 37.3(L) 40.5  Platelets 150 - 400 K/uL 158 155 171    BMP Latest Ref Rng & Units 10/02/2017 10/01/2017 09/30/2017  Glucose 65 - 99 mg/dL 87 102(H) 165(H)  BUN 6 - 20 mg/dL 16 19 18   Creatinine 0.61 - 1.24 mg/dL 0.85 1.03 1.02  Sodium 135 - 145 mmol/L 140 140 138  Potassium 3.5 - 5.1 mmol/L 4.1 4.6 4.8  Chloride 101 - 111 mmol/L 100(L) 101 102  CO2 22 - 32 mmol/L 35(H) 37(H) 30  Calcium 8.9 - 10.3 mg/dL 8.5(L) 8.6(L) 8.9    BNP    Component Value Date/Time   BNP 45.0 09/08/2015 0957    ProBNP    Component Value Date/Time   PROBNP 164.0 (H) 05/03/2008 0400    PFT    Component Value Date/Time   FEV1PRE 1.53 12/18/2015 0944   FEV1POST 1.66 12/18/2015 0944   FVCPRE 2.75 12/18/2015 0944   FVCPOST 2.99 12/18/2015 0944   TLC 6.25 12/18/2015 0944   DLCOUNC 11.85 12/18/2015 0944   PREFEV1FVCRT 56 12/18/2015 0944   PSTFEV1FVCRT 56 12/18/2015 0944    Dg Chest 2 View  Result Date: 10/21/2017 CLINICAL DATA:  Follow-up of pneumonia.  Improving cough. EXAM: CHEST  2 VIEW COMPARISON:  09/30/2017 plain film.  CT of 09/30/2017. FINDINGS: Moderate hyperinflation. Right Port-A-Cath terminates at the low SVC. Midline trachea. Mild cardiomegaly. Atherosclerosis in the transverse aorta. No pleural fluid. Right-sided pleural calcifications. No pneumothorax. Left apical scarring medially. Improved bibasilar aeration. Possible mild residual left base patchy airspace disease. IMPRESSION: Improved bibasilar aeration. Cannot exclude mild residual left lower lobe pneumonia. Hyperinflation. Right-sided pleural calcifications, consistent with prior empyema or  hemothorax. Aortic Atherosclerosis (ICD10-I70.0). Electronically Signed   By: Abigail Miyamoto M.D.   On: 10/21/2017 09:09   Dg Chest 2 View  Result Date: 09/30/2017 CLINICAL DATA:  Shortness of breath EXAM: CHEST  2 VIEW COMPARISON:  Chest CT 09/07/2017 FINDINGS: There is a power injectable right chest wall Port-A-Cath tip at the cavoatrial junction. Calcification along the pleural surface of the right hemi diaphragmatic dome is unchanged. There is bibasilar atelectasis, right worse than left. No focal airspace consolidation. No pulmonary edema. No pneumothorax or pleural effusion. IMPRESSION: Bibasilar atelectasis, right worse than left. Electronically Signed   By: Ulyses Jarred M.D.   On: 09/30/2017 04:27   Ct Angio Chest Pe W And/or Wo Contrast  Result Date: 09/30/2017 CLINICAL DATA:  67 year old male with shortness of breath. EXAM: CT ANGIOGRAPHY CHEST WITH CONTRAST TECHNIQUE: Multidetector CT imaging of the chest was performed using the standard protocol during bolus administration of intravenous contrast. Multiplanar CT image reconstructions and MIPs were obtained to evaluate the vascular anatomy. CONTRAST:  100 cc Isovue 370 COMPARISON:  Chest radiograph dated 09/30/2017 FINDINGS: Cardiovascular: Borderline cardiomegaly. No pericardial effusion. Coronary vascular calcification primarily involving the LAD. Mild atherosclerotic calcification of the thoracic aorta. No aneurysmal dilatation or evidence of dissection. The origins of the great vessels of the aortic arch are patent as visualized. There is dilatation of the main pulmonary trunk suggestive of pulmonary hypertension. Evaluation of the pulmonary arteries is limited due to suboptimal opacification of the peripheral branches. No large or central pulmonary artery embolus identified. Right-sided Port-A-Cath with tip at the cavoatrial junction. Mediastinum/Nodes: Mildly enlarged right hilar lymph nodes measure approximately 17 mm in short axis. No  mediastinal adenopathy. Esophagus is grossly unremarkable. Punctate calcific foci noted in the region of thyroid gland. Lungs/Pleura: Bilateral lower lobe predominant ground-glass and nodular densities concerning for pneumonia. Clinical correlation is recommended. There is thickening of the walls of the lower lobe bronchi. Mucus secretions noted in the mainstem bronchi extending into the lower lobe bronchi. Aspiration is not excluded. There is a focal area of nodularity in the right upper lobe (series 5, image 101) measuring up to 9 mm, likely inflammatory/infectious or scarring. This is somewhat similar in appearance to the CT of 09/07/2017. There is no pleural effusion or pneumothorax. Right lung base calcified pleural plaque. Upper Abdomen: No acute abnormality. Musculoskeletal: No chest wall abnormality. No acute or significant osseous findings. Review of the MIP images confirms the above findings. IMPRESSION: 1. No CT evidence of large or central pulmonary artery embolus. 2. Bilateral lower lobe predominant ground-glass and nodular densities likely pneumonia and possibly aspiration. Clinical correlation and follow-up is recommended. In the bronchial mucus content extend from the mainstem bronchi into the lower lobe bronchi bilaterally. 3. Mildly dilated pulmonary trunk suggestive of underlying pulmonary hypertension. 4. Borderline cardiomegaly. Electronically Signed   By: Anner Crete M.D.   On: 09/30/2017 06:42     Past medical hx Past Medical History:  Diagnosis Date  . BPH (benign prostatic hyperplasia) 2011  . Congestive heart  failure ([place])   . COPD (chronic obstructive pulmonary disease) ([place])   . Depression   . Drug-induced neutropenia ([place]) 10/08/2015  . Encounter for antineoplastic chemotherapy 10/08/2015  . Gastrointestinal obstruction ([facility])    Ileus  . Gout   . Hypertension   . Hypothyroid   . Lung cancer ([place])   . NHL (non-Hodgkin's lymphoma) ([place]) 09/25/2015  . Obesity   .  Polycythemia   . Pulmonary hypertension ([place])   . Sepsis ([place]) 01/14/2016  . Streptococcal pneumonia [facility]) August 2009     Social History   Tobacco Use  . Smoking status: Former Smoker    Packs/day: 3.00    Years: 43.00    Pack years: 129.00    Types: Cigarettes    Last attempt to quit: 10/22/2001    Years since quitting: 16.0  . Smokeless tobacco: Never Used  Substance Use Topics  . Alcohol use: No  . Drug use: No    Mr.Garde reports that he quit smoking about 16 years ago. His smoking use included cigarettes. He has a 129.00 pack-year smoking history. he has never used smokeless tobacco. He reports that he does not drink alcohol or use drugs.  Tobacco Cessation: Former smoker quit 2003 with a 129 pack year history  Past surgical hx, Family hx, Social hx all reviewed.  Current Outpatient Medications on File Prior to Visit  Medication Sig  . albuterol (PROVENTIL) (2.5 MG/3ML) 0.083% nebulizer solution Take 3 mLs (2.5 mg total) by nebulization every 4 (four) hours as needed for wheezing or shortness of breath.  . allopurinol (ZYLOPRIM) 300 MG tablet Take 300 mg by mouth daily.  [name] amLODipine (NORVASC) 5 MG tablet Take 1 tablet by mouth Daily.  . betamethasone dipropionate 0.05 % lotion Apply 1 application topically 2 (two) times daily as needed (psorasis).   . finasteride (PROSCAR) 5 MG tablet Take 5 mg by mouth daily.  [name] guaiFENesin (MUCINEX) 600 MG 12 hr tablet Take 1 tablet (600 mg total) by mouth 2 (two) times daily.  [name] levothyroxine (SYNTHROID, LEVOTHROID) 150 MCG tablet Take 150 mcg by mouth daily.  [name] lisinopril (PRINIVIL,ZESTRIL) 10 MG tablet Take 1 tablet (10 mg total) by mouth daily.  [name] oxyCODONE-acetaminophen (PERCOCET) 10-325 MG tablet Take 1 tablet by mouth every 4 (four) hours as needed for pain.  [name] PROAIR HFA 108 (90 Base) MCG/ACT inhaler Inhale 2 puffs into the lungs every 6 (six) hours as needed for wheezing or shortness of breath.  . tiotropium (SPIRIVA) 18 MCG  inhalation capsule Place 1 capsule (18 mcg total) daily into inhaler and inhale.   No current facility-administered medications on file prior to visit.      No Known Allergies  Review Of Systems:  Constitutional:   No  weight loss, night sweats,  Fevers, chills, fatigue, or  lassitude.  HEENT:   No headaches,  Difficulty swallowing,  Tooth/dental problems, or  Sore throat,                No sneezing, itching, ear ache, nasal congestion, post nasal drip,   CV:  No chest pain,  Orthopnea, PND, swelling in lower extremities, anasarca, dizziness, palpitations, syncope.   GI  No heartburn, indigestion, abdominal pain, nausea, vomiting, diarrhea, change in bowel habits, loss of appetite, bloody stools.   Resp: + shortness of breath with exertion or at rest.  + excess mucus, + productive cough,  + non-productive cough,  No coughing up of blood.  No change in color of mucus.  No wheezing.  No chest wall deformity  Skin: no rash or lesions.  GU: no dysuria, change in color of urine, no urgency or frequency.  No flank pain, no hematuria   MS:  No joint pain or swelling.  No decreased range of motion.  No back pain.  Psych:  No change in mood or affect. No depression or anxiety.  No memory loss.   Vital Signs BP 122/82 (BP Location: Left Arm, Cuff Size: Normal)   Pulse 81   Ht 6' (1.829 m)   Wt 241 lb 12.8 oz (109.7 kg)   SpO2 94%   BMI 32.79 kg/m    Physical Exam:  General- No distress,  A&Ox3, pleasant ENT: No sinus tenderness, TM clear, pale nasal mucosa, no oral exudate,no post nasal drip, no LAN Cardiac: S1, S2, regular rate and rhythm, no murmur Chest: No wheeze/ rales/ dullness; no accessory muscle use, no nasal flaring, no sternal retractions, diminished per bases, prolonged expiratory phase Abd.: Soft Non-tender, BS +, obese Ext: No clubbing cyanosis, edema Neuro:  normal strength, ME x 4, A&O x 3 Skin: No rashes, warm and dry Psych: normal mood and  behavior   Assessment/Plan  PNA (pneumonia) Resolving but not clear Plan: We will prescribe doxycycline 100 mg twice daily x 7 days. Please take probiotic with antibiotic. ( Culturelle or Align) one once daily while on antibiotics. Mucinex 1200 mg once daily with a full glass of water. Follow up in 1 month with Temica Righetti or Dr. Chase Caller to ensure you are better Follow up with Dr. Earlie Server as you have been doing. Note your daily symptoms > remember "red flags" for COPD:  Increase in cough, increase in sputum production, increase in shortness of breath or activity tolerance. If you notice these symptoms, please call to be seen.   Please contact office for sooner follow up if symptoms do not improve or worsen or seek emergency care   COPD, severe ([place]) At baseline Plan: Mucinex 1200 mg once daily with a full glass of water. Continue Spiriva daily as you have been doing. Use rescue nebs/inhaler as needed. Follow up in 1 month with Rishita Petron or Dr. Chase Caller to ensure you are better Follow up with Dr. Earlie Server as you have been doing. Note your daily symptoms > remember "red flags" for COPD:  Increase in cough, increase in sputum production, increase in shortness of breath or activity tolerance. If you notice these symptoms, please call to be seen.   Please contact office for sooner follow up if symptoms do not improve or worsen or seek emergency care   Chronic respiratory failure with hypoxia ([place]) Continue nocturnal oxygen at 2 L [place]    Magdalen Spatz, NP 10/21/2017  9:48 AM

## 2017-10-21 NOTE — Assessment & Plan Note (Signed)
At baseline Plan: Mucinex 1200 mg once daily with a full glass of water. Continue Spiriva daily as you have been doing. Use rescue nebs/inhaler as needed. Follow up in 1 month with Sarah or Dr. Chase Caller to ensure you are better Follow up with Dr. Earlie Server as you have been doing. Note your daily symptoms > remember "red flags" for COPD:  Increase in cough, increase in sputum production, increase in shortness of breath or activity tolerance. If you notice these symptoms, please call to be seen.   Please contact office for sooner follow up if symptoms do not improve or worsen or seek emergency care

## 2017-10-21 NOTE — Patient Instructions (Addendum)
It is good to see you today. We will renew your Prilosec.Rx.( x 90 days) We will prescribe doxycycline 100 mg twice daily x 7 days. Please take probiotic with antibiotic. ( Culturelle or Align) one once daily while on antibiotics. Mucinex 1200 mg once daily with a full glass of water. Continue Spiriva daily as you have been doing. Use rescue nebs/inhaler as needed. Follow up in 1 month with [name] or Dr. Chase Caller to ensure you are better Follow up with Dr. Earlie Server as you have been doing. Note your daily symptoms > remember "red flags" for COPD:  Increase in cough, increase in sputum production, increase in shortness of breath or activity tolerance. If you notice these symptoms, please call to be seen.   Please contact office for sooner follow up if symptoms do not improve or worsen or seek emergency care

## 2017-10-26 DIAGNOSIS — J449 Chronic obstructive pulmonary disease, unspecified: Secondary | ICD-10-CM | POA: Diagnosis not present

## 2017-10-27 DIAGNOSIS — J449 Chronic obstructive pulmonary disease, unspecified: Secondary | ICD-10-CM | POA: Diagnosis not present

## 2017-11-03 ENCOUNTER — Telehealth: Payer: Self-pay | Admitting: Medical Oncology

## 2017-11-03 ENCOUNTER — Telehealth: Payer: Self-pay | Admitting: Internal Medicine

## 2017-11-03 NOTE — Telephone Encounter (Signed)
Patient scheduled for port/flush appointments per 2/13 sch msg

## 2017-11-03 NOTE — Telephone Encounter (Signed)
Schedule request sent for flush appts.

## 2017-11-04 DIAGNOSIS — M5136 Other intervertebral disc degeneration, lumbar region: Secondary | ICD-10-CM | POA: Diagnosis not present

## 2017-11-04 DIAGNOSIS — G8929 Other chronic pain: Secondary | ICD-10-CM | POA: Diagnosis not present

## 2017-11-04 DIAGNOSIS — Z79899 Other long term (current) drug therapy: Secondary | ICD-10-CM | POA: Diagnosis not present

## 2017-11-04 DIAGNOSIS — I10 Essential (primary) hypertension: Secondary | ICD-10-CM | POA: Diagnosis not present

## 2017-11-04 DIAGNOSIS — M545 Low back pain: Secondary | ICD-10-CM | POA: Diagnosis not present

## 2017-11-09 ENCOUNTER — Inpatient Hospital Stay: Payer: PPO | Attending: Internal Medicine

## 2017-11-09 DIAGNOSIS — Z95828 Presence of other vascular implants and grafts: Secondary | ICD-10-CM

## 2017-11-09 DIAGNOSIS — Z452 Encounter for adjustment and management of vascular access device: Secondary | ICD-10-CM | POA: Diagnosis not present

## 2017-11-09 DIAGNOSIS — Z8572 Personal history of non-Hodgkin lymphomas: Secondary | ICD-10-CM | POA: Insufficient documentation

## 2017-11-09 MED ORDER — SODIUM CHLORIDE 0.9% FLUSH
10.0000 mL | INTRAVENOUS | Status: DC | PRN
  Administered 2017-11-09: 10 mL via INTRAVENOUS
  Filled 2017-11-09: qty 10

## 2017-11-09 MED ORDER — HEPARIN SOD (PORK) LOCK FLUSH 100 UNIT/ML IV SOLN
500.0000 [IU] | Freq: Once | INTRAVENOUS | Status: AC | PRN
  Administered 2017-11-09: 500 [IU] via INTRAVENOUS
  Filled 2017-11-09: qty 5

## 2017-11-18 ENCOUNTER — Ambulatory Visit (INDEPENDENT_AMBULATORY_CARE_PROVIDER_SITE_OTHER)
Admission: RE | Admit: 2017-11-18 | Discharge: 2017-11-18 | Disposition: A | Discharge status: Home / Self Care | Payer: PPO | Source: Ambulatory Visit | Attending: Acute Care | Admitting: Acute Care

## 2017-11-18 ENCOUNTER — Encounter: Payer: Self-pay | Admitting: Acute Care

## 2017-11-18 ENCOUNTER — Ambulatory Visit: Payer: PPO | Admitting: Acute Care

## 2017-11-18 VITALS — BP 190/90 | HR 61 | Ht 72.0 in | Wt 248.0 lb

## 2017-11-18 DIAGNOSIS — C3490 Malignant neoplasm of unspecified part of unspecified bronchus or lung: Secondary | ICD-10-CM | POA: Diagnosis not present

## 2017-11-18 DIAGNOSIS — J189 Pneumonia, unspecified organism: Secondary | ICD-10-CM

## 2017-11-18 DIAGNOSIS — J449 Chronic obstructive pulmonary disease, unspecified: Secondary | ICD-10-CM | POA: Diagnosis not present

## 2017-11-18 DIAGNOSIS — J9621 Acute and chronic respiratory failure with hypoxia: Secondary | ICD-10-CM | POA: Diagnosis not present

## 2017-11-18 MED ORDER — BUDESONIDE-FORMOTEROL FUMARATE 160-4.5 MCG/ACT IN AERO: 2.0000 | INHALATION_SPRAY | Freq: Two times a day (BID) | RESPIRATORY_TRACT | 0 refills | Status: DC

## 2017-11-18 MED ORDER — BUDESONIDE-FORMOTEROL FUMARATE 160-4.5 MCG/ACT IN AERO: 2.0000 | INHALATION_SPRAY | Freq: Two times a day (BID) | RESPIRATORY_TRACT | 5 refills | Status: DC

## 2017-11-18 NOTE — Assessment & Plan Note (Signed)
Continue wearing oxygen at 2 L [place] at night

## 2017-11-18 NOTE — Assessment & Plan Note (Signed)
Follow up Dr. Earlie Server 03/2018 as scheduled

## 2017-11-18 NOTE — Assessment & Plan Note (Signed)
At baseline after tx for flare Plan: Continue Spiriva one capsule daily. Restart Symbicort 2 puffs twice daily. Rinse mouth after use. Continue using your rescue inhaler as needed. Continue wearing oxygen at night at 2 L [place]. Note your daily symptoms > remember "red flags" for COPD:  Increase in cough, increase in sputum production, increase in shortness of breath or activity tolerance. If you notice these symptoms, please call to be seen.   Follow up in 3 months with Dr. Chase Caller Please contact office for sooner follow up if symptoms do not improve or worsen or seek emergency care

## 2017-11-18 NOTE — Assessment & Plan Note (Signed)
Resolving Clinically better Plan: CXR today We will call you with results Note your daily symptoms > remember "red flags" for COPD:  Increase in cough, increase in sputum production, increase in shortness of breath or activity tolerance. If you notice these symptoms, please call to be seen.

## 2017-11-18 NOTE — Progress Notes (Signed)
History of Present Illness [name] is a 67 y.o. male former smoker ( 129 pack year history, quit 2003) with Gold 3 COPD,on oxygen, with history of Squamous cell Lung Cancer 2011 -s/p LUL resection. He is followed by Dr. Chase Caller. Maintenance medication: Spiriva  10/21/2017 Hospital Follow up: Admission 09/30/2017-10/02/2017 HPI Jose A Pierceis a 67 y.o.malewith medical history significant ofNHL in remission for a year, copd, CRF on qhs oxygen at 2 liters [place], HTN comes in with sob for one day. He was sob all night despite his oxygen he sleeps with. Denies fevers, chills, cough. No n/v/d. He has not been on antibiotics for over 6 months. He called his PCP who told him to take some augmentin he had last night, he has only had one dose of that. Denies any chest pain. No le edema or swelling or pain. Pt with ems found to have oxygen sats in the upper 80s on his 2 liters at home. Referred for admission for finding of pna on xray. H/oRecurrent RML and RLL pna due to dysphagia/aspiration, He was treated with rocephin/ azithro  on admission, changed to Unasyn. Discharged on Augmentin . He finished full treatment coarse. Sputum remained thick and yellow at [facility] 1/31, and patient has residual cough.   Antibiotics:  Rocephin/zithrofrom admission to 1/11  unasyn from 1/11  Discharged on Augmentin x 10 days  Doxycycline x 7 days starting 1/31 for non-resolved pneumonia/mild flare  Plan after hospital follow up 1/31 was as follows:  PNA (pneumonia) Resolving but not clear Plan: We will prescribe doxycycline 100 mg twice daily x 7 days. Please take probiotic with antibiotic. ( Culturelle or Align) one once daily while on antibiotics. Mucinex 1200 mg once daily with a full glass of water. Follow up in 1 month with [name] or Dr. Chase Caller to ensure you are better Follow up with Dr. Earlie Server as you have been doing. Note your daily symptoms >remember "red flags" for COPD:  Increase in cough, increase in sputum production, increase in shortness of breath or activity tolerance. If you notice these symptoms, please call to be seen.  Please contact office for sooner follow up if symptoms do not improve or worsen or seek emergency care   COPD, severe ([place]) At baseline Plan: Mucinex 1200 mg once daily with a full glass of water. Continue Spiriva daily as you have been doing. Use rescue nebs/inhaler as needed. Follow up in 1 month with [name] or Dr. Chase Caller to ensure you are better Follow up with Dr. Earlie Server as you have been doing. Note your daily symptoms >remember "red flags" for COPD: Increase in cough, increase in sputum production, increase in shortness of breath or activity tolerance. If you notice these symptoms, please call to be seen.  Please contact office for sooner follow up if symptoms do not improve or worsen or seek emergency care   Chronic respiratory failure with hypoxia ([place]) Continue nocturnal oxygen at 2 L [place]    11/18/2017 One month follow up: ( Needs follow up CXR)  Pt. Presents for follow up. He states he has been doing well . He states his cough is gone. Secretions are his baseline secretions.He completed his Doxy and is compliant with his Spiriva. He states he is using his rescue inhaler approx. 2 x daily to none at all. His BP is high. We have told him to follow up with his PCP today if possible. He denies fever, chest pain, orthopnea of hemoptysis.   Test Results: CXR 10/21/2017>>IMPRESSION:  Improved bibasilar aeration. Cannot exclude mild residual left lower lobe pneumonia. Hyperinflation. Right-sided pleural calcifications, consistent with prior empyema or Hemothorax.  FEV1 1.66 L/45% post-dilator in March 2017 and DLCO 13.96/39% Large B cell non Hodgkin lymphoma dx 2016 [facility]  CBC Latest Ref Rng & Units 10/02/2017 10/01/2017 09/30/2017  WBC 4.0 - 10.5 K/uL 7.0 9.3 18.4(H)  Hemoglobin 13.0 - 17.0 g/dL 11.0(L)  11.5(L) 13.2  Hematocrit 39.0 - 52.0 % 34.7(L) 37.3(L) 40.5  Platelets 150 - 400 K/uL 158 155 171    BMP Latest Ref Rng & Units 10/02/2017 10/01/2017 09/30/2017  Glucose 65 - 99 mg/dL 87 102(H) 165(H)  BUN 6 - 20 mg/dL 16 19 18   Creatinine 0.61 - 1.24 mg/dL 0.85 1.03 1.02  Sodium 135 - 145 mmol/L 140 140 138  Potassium 3.5 - 5.1 mmol/L 4.1 4.6 4.8  Chloride 101 - 111 mmol/L 100(L) 101 102  CO2 22 - 32 mmol/L 35(H) 37(H) 30  Calcium 8.9 - 10.3 mg/dL 8.5(L) 8.6(L) 8.9    BNP    Component Value Date/Time   BNP 45.0 09/08/2015 0957    ProBNP    Component Value Date/Time   PROBNP 164.0 (H) 05/03/2008 0400    PFT    Component Value Date/Time   FEV1PRE 1.53 12/18/2015 0944   FEV1POST 1.66 12/18/2015 0944   FVCPRE 2.75 12/18/2015 0944   FVCPOST 2.99 12/18/2015 0944   TLC 6.25 12/18/2015 0944   DLCOUNC 11.85 12/18/2015 0944   PREFEV1FVCRT 56 12/18/2015 0944   PSTFEV1FVCRT 56 12/18/2015 0944    Dg Chest 2 View  Result Date: 10/21/2017 CLINICAL DATA:  Follow-up of pneumonia.  Improving cough. EXAM: CHEST  2 VIEW COMPARISON:  09/30/2017 plain film.  CT of 09/30/2017. FINDINGS: Moderate hyperinflation. Right Port-A-Cath terminates at the low SVC. Midline trachea. Mild cardiomegaly. Atherosclerosis in the transverse aorta. No pleural fluid. Right-sided pleural calcifications. No pneumothorax. Left apical scarring medially. Improved bibasilar aeration. Possible mild residual left base patchy airspace disease. IMPRESSION: Improved bibasilar aeration. Cannot exclude mild residual left lower lobe pneumonia. Hyperinflation. Right-sided pleural calcifications, consistent with prior empyema or hemothorax. Aortic Atherosclerosis (ICD10-I70.0). Electronically Signed   By: Abigail Miyamoto M.D.   On: 10/21/2017 09:09     Past medical hx Past Medical History:  Diagnosis Date  . BPH (benign prostatic hyperplasia) 2011  . Congestive heart failure ([place])   . COPD (chronic obstructive pulmonary  disease) ([place])   . Depression   . Drug-induced neutropenia ([place]) 10/08/2015  . Encounter for antineoplastic chemotherapy 10/08/2015  . Gastrointestinal obstruction (H[facility])    Ileus  . Gout   . Hypertension   . Hypothyroid   . Lung cancer ([place])   . NHL (non-Hodgkin's lymphoma) ([place]) 09/25/2015  . Obesity   . Polycythemia   . Pulmonary hypertension ([place])   . Sepsis ([place]) 01/14/2016  . Streptococcal pneumonia [facility]) August 2009     Social History   Tobacco Use  . Smoking status: Former Smoker    Packs/day: 3.00    Years: 43.00    Pack years: 129.00    Types: Cigarettes    Last attempt to quit: 10/22/2001    Years since quitting: 16.0  . Smokeless tobacco: Never Used  Substance Use Topics  . Alcohol use: No  . Drug use: No    Mr.Middendorf reports that he quit smoking about 16 years ago. His smoking use included cigarettes. He has a 129.00 pack-year smoking history. he has never used smokeless tobacco. He reports that he  does not drink alcohol or use drugs.  Tobacco Cessation: Former smoker, quit 2003  Past surgical hx, Family hx, Social hx all reviewed.  Current Outpatient Medications on File Prior to Visit  Medication Sig  . albuterol (PROVENTIL) (2.5 MG/3ML) 0.083% nebulizer solution Take 3 mLs (2.5 mg total) by nebulization every 4 (four) hours as needed for wheezing or shortness of breath.  . allopurinol (ZYLOPRIM) 300 MG tablet Take 300 mg by mouth daily.  [name] amLODipine (NORVASC) 5 MG tablet Take 1 tablet by mouth Daily.  . betamethasone dipropionate 0.05 % lotion Apply 1 application topically 2 (two) times daily as needed (psorasis).   [name] doxycycline (VIBRA-TABS) 100 MG tablet Take 1 tablet (100 mg total) by mouth 2 (two) times daily.  . finasteride (PROSCAR) 5 MG tablet Take 5 mg by mouth daily.  [name] guaiFENesin (MUCINEX) 600 MG 12 hr tablet Take 1 tablet (600 mg total) by mouth 2 (two) times daily.  [name] levothyroxine (SYNTHROID, LEVOTHROID) 150 MCG tablet Take 150 mcg by mouth  daily.  [name] lisinopril (PRINIVIL,ZESTRIL) 10 MG tablet Take 1 tablet (10 mg total) by mouth daily.  [name] omeprazole (PRILOSEC) 20 MG capsule Take 1 capsule (20 mg total) by mouth 2 (two) times daily before a meal.  . oxyCODONE-acetaminophen (PERCOCET) 10-325 MG tablet Take 1 tablet by mouth every 4 (four) hours as needed for pain.  [name] PROAIR HFA 108 (90 Base) MCG/ACT inhaler Inhale 2 puffs into the lungs every 6 (six) hours as needed for wheezing or shortness of breath.  . tiotropium (SPIRIVA) 18 MCG inhalation capsule Place 1 capsule (18 mcg total) daily into inhaler and inhale.   No current facility-administered medications on file prior to visit.      No Known Allergies  Review Of Systems:  Constitutional:   No  weight loss, night sweats,  Fevers, chills, fatigue, or  lassitude.  HEENT:   No headaches,  Difficulty swallowing,  Tooth/dental problems, or  Sore throat,                No sneezing, itching, ear ache, nasal congestion, post nasal drip,   CV:  No chest pain,  Orthopnea, PND, swelling in lower extremities, anasarca, dizziness, palpitations, syncope.   GI  No heartburn, indigestion, abdominal pain, nausea, vomiting, diarrhea, change in bowel habits, loss of appetite, bloody stools.   Resp: No shortness of breath with exertion or at rest.  No excess mucus, no productive cough,  No non-productive cough,  No coughing up of blood.  No change in color of mucus.  No wheezing.  No chest wall deformity  Skin: no rash or lesions.  GU: no dysuria, change in color of urine, no urgency or frequency.  No flank pain, no hematuria   MS:  No joint pain or swelling.  No decreased range of motion.  No back pain.  Psych:  No change in mood or affect. No depression or anxiety.  No memory loss.   Vital Signs BP (!) 190/90 (BP Location: Left Arm, Cuff Size: Normal)   Pulse 61   Ht 6' (1.829 m)   Wt 248 lb (112.5 kg)   SpO2 93%   BMI 33.63 kg/m    Physical Exam:  General- No distress,   A&Ox3 ENT: No sinus tenderness, TM clear, pale nasal mucosa, no oral exudate,no post nasal drip, no LAN Cardiac: S1, S2, regular rate and rhythm, no murmur Chest: No wheeze/ rales/ dullness; no accessory muscle use, no nasal flaring, no sternal retractions  Abd.: Soft Non-tender Ext: No clubbing cyanosis, edema Neuro:  normal strength Skin: No rashes, warm and dry Psych: normal mood and behavior   Assessment/Plan  PNA (pneumonia) Resolving Clinically better Plan: CXR today We will call you with results Note your daily symptoms > remember "red flags" for COPD:  Increase in cough, increase in sputum production, increase in shortness of breath or activity tolerance. If you notice these symptoms, please call to be seen.     COPD, severe ([place]) At baseline after tx for flare Plan: Continue Spiriva one capsule daily. Restart Symbicort 2 puffs twice daily. Rinse mouth after use. Continue using your rescue inhaler as needed. Continue wearing oxygen at night at 2 L [place]. Note your daily symptoms > remember "red flags" for COPD:  Increase in cough, increase in sputum production, increase in shortness of breath or activity tolerance. If you notice these symptoms, please call to be seen.   Follow up in 3 months with Dr. Chase Caller Please contact office for sooner follow up if symptoms do not improve or worsen or seek emergency care     Squamous cell carcinoma lung ([place]) Follow up Dr. Earlie Server 03/2018 as scheduled  Acute on chronic respiratory failure with hypoxia ([place]) Continue wearing oxygen at 2 L [place] at night    Magdalen Spatz, NP 11/18/2017  9:25 AM

## 2017-11-18 NOTE — Patient Instructions (Addendum)
It is good to see you today. I am glad you are better.  CXR today We will call you with results Please follow up with your PCP regarding your BP. It is dangerously high. Watch salt intake until you see Dr. Baruch Gouty.  Continue Spiriva one capsule daily. Restart Symbicort 2 puffs twice daily. Rinse mouth after use. Continue using your rescue inhaler as needed. Continue wearing oxygen at night at 2 L . Note your daily symptoms > remember "red flags" for COPD:  Increase in cough, increase in sputum production, increase in shortness of breath or activity tolerance. If you notice these symptoms, please call to be seen.   Follow up in 3 months with Dr. Chase Caller Please contact office for sooner follow up if symptoms do not improve or worsen or seek emergency care

## 2017-11-18 NOTE — Addendum Note (Signed)
Addended by: Jannette Spanner on: 11/18/2017 09:31 AM   Modules accepted: Orders

## 2017-11-23 DIAGNOSIS — J449 Chronic obstructive pulmonary disease, unspecified: Secondary | ICD-10-CM | POA: Diagnosis not present

## 2017-11-24 DIAGNOSIS — J449 Chronic obstructive pulmonary disease, unspecified: Secondary | ICD-10-CM | POA: Diagnosis not present

## 2017-12-01 DIAGNOSIS — Z79899 Other long term (current) drug therapy: Secondary | ICD-10-CM | POA: Diagnosis not present

## 2017-12-01 DIAGNOSIS — M5136 Other intervertebral disc degeneration, lumbar region: Secondary | ICD-10-CM | POA: Diagnosis not present

## 2017-12-21 ENCOUNTER — Inpatient Hospital Stay: Payer: PPO | Attending: Internal Medicine

## 2017-12-21 DIAGNOSIS — Z95828 Presence of other vascular implants and grafts: Secondary | ICD-10-CM

## 2017-12-21 DIAGNOSIS — Z8572 Personal history of non-Hodgkin lymphomas: Secondary | ICD-10-CM | POA: Insufficient documentation

## 2017-12-21 MED ORDER — HEPARIN SOD (PORK) LOCK FLUSH 100 UNIT/ML IV SOLN
500.0000 [IU] | Freq: Once | INTRAVENOUS | Status: AC | PRN
  Administered 2017-12-21: 500 [IU] via INTRAVENOUS
  Filled 2017-12-21: qty 5

## 2017-12-21 MED ORDER — SODIUM CHLORIDE 0.9% FLUSH
10.0000 mL | INTRAVENOUS | Status: DC | PRN
  Administered 2017-12-21: 10 mL via INTRAVENOUS
  Filled 2017-12-21: qty 10

## 2017-12-24 DIAGNOSIS — J449 Chronic obstructive pulmonary disease, unspecified: Secondary | ICD-10-CM | POA: Diagnosis not present

## 2017-12-25 DIAGNOSIS — J449 Chronic obstructive pulmonary disease, unspecified: Secondary | ICD-10-CM | POA: Diagnosis not present

## 2018-01-01 DIAGNOSIS — Z79899 Other long term (current) drug therapy: Secondary | ICD-10-CM | POA: Diagnosis not present

## 2018-01-01 DIAGNOSIS — M545 Low back pain: Secondary | ICD-10-CM | POA: Diagnosis not present

## 2018-01-01 DIAGNOSIS — I10 Essential (primary) hypertension: Secondary | ICD-10-CM | POA: Diagnosis not present

## 2018-01-01 DIAGNOSIS — G8929 Other chronic pain: Secondary | ICD-10-CM | POA: Diagnosis not present

## 2018-01-01 DIAGNOSIS — E7800 Pure hypercholesterolemia, unspecified: Secondary | ICD-10-CM | POA: Diagnosis not present

## 2018-01-01 DIAGNOSIS — M109 Gout, unspecified: Secondary | ICD-10-CM | POA: Diagnosis not present

## 2018-01-23 DIAGNOSIS — J449 Chronic obstructive pulmonary disease, unspecified: Secondary | ICD-10-CM | POA: Diagnosis not present

## 2018-01-24 DIAGNOSIS — J449 Chronic obstructive pulmonary disease, unspecified: Secondary | ICD-10-CM | POA: Diagnosis not present

## 2018-01-27 DIAGNOSIS — N200 Calculus of kidney: Secondary | ICD-10-CM | POA: Diagnosis not present

## 2018-01-27 DIAGNOSIS — R0602 Shortness of breath: Secondary | ICD-10-CM | POA: Diagnosis not present

## 2018-01-27 DIAGNOSIS — Z0000 Encounter for general adult medical examination without abnormal findings: Secondary | ICD-10-CM | POA: Diagnosis not present

## 2018-01-27 DIAGNOSIS — Z79899 Other long term (current) drug therapy: Secondary | ICD-10-CM | POA: Diagnosis not present

## 2018-01-31 DIAGNOSIS — M545 Low back pain: Secondary | ICD-10-CM | POA: Diagnosis not present

## 2018-02-01 ENCOUNTER — Inpatient Hospital Stay: Payer: PPO | Attending: Internal Medicine

## 2018-02-01 DIAGNOSIS — Z8572 Personal history of non-Hodgkin lymphomas: Secondary | ICD-10-CM | POA: Insufficient documentation

## 2018-02-01 DIAGNOSIS — Z95828 Presence of other vascular implants and grafts: Secondary | ICD-10-CM

## 2018-02-01 MED ORDER — SODIUM CHLORIDE 0.9% FLUSH
10.0000 mL | INTRAVENOUS | Status: DC | PRN
  Administered 2018-02-01: 10 mL via INTRAVENOUS
  Filled 2018-02-01: qty 10

## 2018-02-01 MED ORDER — HEPARIN SOD (PORK) LOCK FLUSH 100 UNIT/ML IV SOLN
500.0000 [IU] | Freq: Once | INTRAVENOUS | Status: AC | PRN
  Administered 2018-02-01: 500 [IU] via INTRAVENOUS
  Filled 2018-02-01: qty 5

## 2018-02-10 ENCOUNTER — Telehealth: Payer: Self-pay | Admitting: Adult Health

## 2018-02-10 MED ORDER — PREDNISONE 10 MG PO TABS: ORAL_TABLET | ORAL | 0 refills | Status: DC

## 2018-02-10 MED ORDER — AMOXICILLIN-POT CLAVULANATE 875-125 MG PO TABS: 1.0000 | ORAL_TABLET | Freq: Two times a day (BID) | ORAL | 1 refills | Status: DC

## 2018-02-10 NOTE — Telephone Encounter (Signed)
Called pt and advised message from the provider. Pt understood and verbalized understanding. Nothing further is needed.   Rx's sent in.

## 2018-02-10 NOTE — Telephone Encounter (Signed)
Spoke with pt in the lobby. States that he is in need of an antibiotic. Reports that he feels like he is getting PNA. When asked what symptoms he was experiencing, he would not give me a straight answer. He states that Dr. Chase Caller always likes for him to have an antibiotic on hand when this happens. Pt has taken Augmentin in the past and would like for this to be sent in. His PCP is refusing to give him this prescription. Pt has left the office and will await our call.  Dr. Chase Caller - please advise. Thanks.

## 2018-02-10 NOTE — Telephone Encounter (Signed)
Ok to get augmentin 875mg  bid x 7 days but let him kow with recurrent antibiotics there is c diff and diarrhea risk - he has to monitor for that. He can have 1 refill as well  If wheezing is worse, then can do Please take prednisone 40 mg x1 day, then 30 mg x1 day, then 20 mg x1 day, then 10 mg x1 day, and then 5 mg x1 day and stop

## 2018-02-23 DIAGNOSIS — J449 Chronic obstructive pulmonary disease, unspecified: Secondary | ICD-10-CM | POA: Diagnosis not present

## 2018-02-24 DIAGNOSIS — Z79899 Other long term (current) drug therapy: Secondary | ICD-10-CM | POA: Diagnosis not present

## 2018-02-24 DIAGNOSIS — N200 Calculus of kidney: Secondary | ICD-10-CM | POA: Diagnosis not present

## 2018-02-24 DIAGNOSIS — M545 Low back pain: Secondary | ICD-10-CM | POA: Diagnosis not present

## 2018-02-24 DIAGNOSIS — G8929 Other chronic pain: Secondary | ICD-10-CM | POA: Diagnosis not present

## 2018-02-24 DIAGNOSIS — J449 Chronic obstructive pulmonary disease, unspecified: Secondary | ICD-10-CM | POA: Diagnosis not present

## 2018-02-24 DIAGNOSIS — M519 Unspecified thoracic, thoracolumbar and lumbosacral intervertebral disc disorder: Secondary | ICD-10-CM | POA: Diagnosis not present

## 2018-03-10 ENCOUNTER — Inpatient Hospital Stay: Payer: PPO | Attending: Internal Medicine | Admitting: Internal Medicine

## 2018-03-10 ENCOUNTER — Inpatient Hospital Stay: Payer: PPO

## 2018-03-10 ENCOUNTER — Encounter: Payer: Self-pay | Admitting: Internal Medicine

## 2018-03-10 ENCOUNTER — Telehealth: Payer: Self-pay | Admitting: Internal Medicine

## 2018-03-10 VITALS — BP 138/60 | HR 48 | Temp 98.5°F | Resp 18 | Ht 72.0 in | Wt 240.2 lb

## 2018-03-10 DIAGNOSIS — J449 Chronic obstructive pulmonary disease, unspecified: Secondary | ICD-10-CM | POA: Diagnosis not present

## 2018-03-10 DIAGNOSIS — E669 Obesity, unspecified: Secondary | ICD-10-CM

## 2018-03-10 DIAGNOSIS — I509 Heart failure, unspecified: Secondary | ICD-10-CM | POA: Diagnosis not present

## 2018-03-10 DIAGNOSIS — Z9221 Personal history of antineoplastic chemotherapy: Secondary | ICD-10-CM | POA: Diagnosis not present

## 2018-03-10 DIAGNOSIS — E039 Hypothyroidism, unspecified: Secondary | ICD-10-CM

## 2018-03-10 DIAGNOSIS — C8332 Diffuse large B-cell lymphoma, intrathoracic lymph nodes: Secondary | ICD-10-CM

## 2018-03-10 DIAGNOSIS — F329 Major depressive disorder, single episode, unspecified: Secondary | ICD-10-CM | POA: Diagnosis not present

## 2018-03-10 DIAGNOSIS — D709 Neutropenia, unspecified: Secondary | ICD-10-CM

## 2018-03-10 DIAGNOSIS — Z8572 Personal history of non-Hodgkin lymphomas: Secondary | ICD-10-CM | POA: Insufficient documentation

## 2018-03-10 DIAGNOSIS — I10 Essential (primary) hypertension: Secondary | ICD-10-CM | POA: Diagnosis not present

## 2018-03-10 DIAGNOSIS — C8258 Diffuse follicle center lymphoma, lymph nodes of multiple sites: Secondary | ICD-10-CM

## 2018-03-10 DIAGNOSIS — N400 Enlarged prostate without lower urinary tract symptoms: Secondary | ICD-10-CM | POA: Insufficient documentation

## 2018-03-10 LAB — COMPREHENSIVE METABOLIC PANEL
ALBUMIN: 3.8 g/dL (ref 3.5–5.0)
ALT: <6 U/L (ref 0–55)
AST: 11 U/L (ref 5–34)
Alkaline Phosphatase: 92 U/L (ref 40–150)
Anion gap: 6 (ref 3–11)
BUN: 13 mg/dL (ref 7–26)
CHLORIDE: 103 mmol/L (ref 98–109)
CO2: 33 mmol/L — AB (ref 22–29)
Calcium: 9 mg/dL (ref 8.4–10.4)
Creatinine, Ser: 1.03 mg/dL (ref 0.70–1.30)
GFR calc Af Amer: >60 mL/min (ref >60)
GFR calc non Af Amer: >60 mL/min (ref >60)
GLUCOSE: 99 mg/dL (ref 70–140)
POTASSIUM: 4.7 mmol/L (ref 3.5–5.1)
Sodium: 142 mmol/L (ref 136–145)
Total Bilirubin: 0.4 mg/dL (ref 0.2–1.2)
Total Protein: 6.2 g/dL — ABNORMAL LOW (ref 6.4–8.3)

## 2018-03-10 LAB — CBC WITH DIFFERENTIAL/PLATELET
Basophils Absolute: 0 10*3/uL (ref 0.0–0.1)
Basophils Relative: 1 %
EOS PCT: 5 %
Eosinophils Absolute: 0.3 10*3/uL (ref 0.0–0.5)
HCT: 39.1 % (ref 38.4–49.9)
Hemoglobin: 12.8 g/dL — ABNORMAL LOW (ref 13.0–17.1)
LYMPHS ABS: 1.1 10*3/uL (ref 0.9–3.3)
LYMPHS PCT: 21 %
MCH: 29.5 pg (ref 27.2–33.4)
MCHC: 32.7 g/dL (ref 32.0–36.0)
MCV: 90.4 fL (ref 79.3–98.0)
MONO ABS: 0.4 10*3/uL (ref 0.1–0.9)
MONOS PCT: 9 %
Neutro Abs: 3.2 10*3/uL (ref 1.5–6.5)
Neutrophils Relative %: 64 %
Platelets: 166 10*3/uL (ref 140–400)
RBC: 4.33 MIL/uL (ref 4.20–5.82)
RDW: 14.2 % (ref 11.0–14.6)
WBC: 5.1 10*3/uL (ref 4.0–10.3)

## 2018-03-10 LAB — LACTATE DEHYDROGENASE: LDH: 83 U/L — AB (ref 125–245)

## 2018-03-10 NOTE — Progress Notes (Signed)
[facility] Telephone:(336) [phone]   Fax:(336) [phone]  OFFICE PROGRESS NOTE  Iva Lento, PA-C [address] [place] [address]  DIAGNOSIS: Stage III large B-cell non-Hodgkin lymphoma diagnosed in December 2016 and presented with extensive lymphadenopathy involving the neck, chest, abdomen as well as splenomegaly.   PRIOR THERAPY: Systemic chemotherapy with the CHOP/Rituxan every 3 weeks with Neulasta support. Status post 6 cycles.  CURRENT THERAPY: Observation.   INTERVAL HISTORY: [name] 67 y.o. male returns to the clinic today for six-month follow-up visit accompanied by his wife.  The patient is feeling fine with no specific complaints.  He denied having any chest pain, shortness breath, cough or hemoptysis.  He denied having any fever or chills.  He has no nausea, vomiting, diarrhea or constipation.  He was seen recently by his primary care physician and was told that there was a grape size lesion close to the esophagus but I do not have any record of this finding and the patient is asymptomatic.  He is here today for evaluation with repeat blood work.  MEDICAL HISTORY: Past Medical History:  Diagnosis Date  . BPH (benign prostatic hyperplasia) 2011  . Congestive heart failure ([place])   . COPD (chronic obstructive pulmonary disease) ([place])   . Depression   . Drug-induced neutropenia ([place]) 10/08/2015  . Encounter for antineoplastic chemotherapy 10/08/2015  . Gastrointestinal obstruction (H[facility])    Ileus  . Gout   . Hypertension   . Hypothyroid   . Lung cancer ([place])   . NHL (non-Hodgkin's lymphoma) ([place]) 09/25/2015  . Obesity   . Polycythemia   . Pulmonary hypertension ([place])   . Sepsis ([place]) 01/14/2016  . Streptococcal pneumonia [facility]) August 2009    ALLERGIES:  has No Known Allergies.  MEDICATIONS:  Current Outpatient Medications  Medication Sig Dispense Refill  . albuterol (PROVENTIL) (2.5 MG/3ML) 0.083% nebulizer solution Take 3 mLs  (2.5 mg total) by nebulization every 4 (four) hours as needed for wheezing or shortness of breath. 75 mL 3  . allopurinol (ZYLOPRIM) 300 MG tablet Take 300 mg by mouth daily.    [name] amLODipine (NORVASC) 5 MG tablet Take 1 tablet by mouth Daily.    [name] amoxicillin-clavulanate (AUGMENTIN) 875-125 MG tablet Take 1 tablet by mouth 2 (two) times daily. 14 tablet 1  . betamethasone dipropionate 0.05 % lotion Apply 1 application topically 2 (two) times daily as needed (psorasis).     . budesonide-formoterol (SYMBICORT) 160-4.5 MCG/ACT inhaler Inhale 2 puffs into the lungs 2 (two) times daily. 2 Inhaler 0  . budesonide-formoterol (SYMBICORT) 160-4.5 MCG/ACT inhaler Inhale 2 puffs into the lungs 2 (two) times daily. 1 Inhaler 5  . doxycycline (VIBRA-TABS) 100 MG tablet Take 1 tablet (100 mg total) by mouth 2 (two) times daily. 14 tablet 0  . finasteride (PROSCAR) 5 MG tablet Take 5 mg by mouth daily.    [name] guaiFENesin (MUCINEX) 600 MG 12 hr tablet Take 1 tablet (600 mg total) by mouth 2 (two) times daily. 30 tablet 0  . levothyroxine (SYNTHROID, LEVOTHROID) 150 MCG tablet Take 150 mcg by mouth daily.    [name] lisinopril (PRINIVIL,ZESTRIL) 10 MG tablet Take 1 tablet (10 mg total) by mouth daily. 30 tablet 11  . omeprazole (PRILOSEC) 20 MG capsule Take 1 capsule (20 mg total) by mouth 2 (two) times daily before a meal. 180 capsule 1  . oxyCODONE-acetaminophen (PERCOCET) 10-325 MG tablet Take 1 tablet by mouth every 4 (four) hours as  needed for pain.    . predniSONE (DELTASONE) 10 MG tablet Take 4 x1 day, 3 x1 day, then 2 x1 day, 1 x1 day, and then 0.5 mg x1 day and stop  11 tablet 0  . PROAIR HFA 108 (90 Base) MCG/ACT inhaler Inhale 2 puffs into the lungs every 6 (six) hours as needed for wheezing or shortness of breath. 3 Inhaler 1  . tiotropium (SPIRIVA) 18 MCG inhalation capsule Place 1 capsule (18 mcg total) daily into inhaler and inhale. 90 capsule 1   No current facility-administered medications for this  visit.     SURGICAL HISTORY:  Past Surgical History:  Procedure Laterality Date  . APPENDECTOMY    . BACK SURGERY    . CARDIOVERSION  11/27/2011   Procedure: CARDIOVERSION;  Surgeon: Thayer Headings, MD;  Location: [facility];  Service: Cardiovascular;  Laterality: N/A;  . LUNG CANCER SURGERY  2011    REVIEW OF SYSTEMS:  A comprehensive review of systems was negative.   PHYSICAL EXAMINATION: General appearance: alert, cooperative, appears stated age and no distress Head: Normocephalic, without obvious abnormality, atraumatic Neck: no adenopathy, no JVD, supple, symmetrical, trachea midline and thyroid not enlarged, symmetric, no tenderness/mass/nodules Lymph nodes: Cervical, supraclavicular, and axillary nodes normal. Resp: clear to auscultation bilaterally Back: symmetric, no curvature. ROM normal. No CVA tenderness. Cardio: regular rate and rhythm, S1, S2 normal, no murmur, click, rub or gallop GI: soft, non-tender; bowel sounds normal; no masses,  no organomegaly Extremities: extremities normal, atraumatic, no cyanosis or edema  ECOG PERFORMANCE STATUS: 1 - Symptomatic but completely ambulatory  Blood pressure 138/60, pulse (!) 48, temperature 98.5 F (36.9 C), temperature source Oral, resp. rate 18, height 6' (1.829 m), weight 240 lb 3.2 oz (109 kg), SpO2 96 %.  LABORATORY DATA: Lab Results  Component Value Date   WBC 7.0 10/02/2017   HGB 11.0 (L) 10/02/2017   HCT 34.7 (L) 10/02/2017   MCV 93.3 10/02/2017   PLT 158 10/02/2017      Chemistry      Component Value Date/Time   NA 140 10/02/2017 0500   NA 140 09/07/2017 1322   K 4.1 10/02/2017 0500   K 5.2 No visable hemolysis (H) 09/07/2017 1322   CL 100 (L) 10/02/2017 0500   CO2 35 (H) 10/02/2017 0500   CO2 33 (H) 09/07/2017 1322   BUN 16 10/02/2017 0500   BUN 13.6 09/07/2017 1322   CREATININE 0.85 10/02/2017 0500   CREATININE 1.0 09/07/2017 1322      Component Value Date/Time   CALCIUM 8.5 (L) 10/02/2017 0500    CALCIUM 9.1 09/07/2017 1322   ALKPHOS 78 09/30/2017 0421   ALKPHOS 92 09/07/2017 1322   AST 19 09/30/2017 0421   AST 11 09/07/2017 1322   ALT 12 (L) 09/30/2017 0421   ALT 8 09/07/2017 1322   BILITOT 0.8 09/30/2017 0421   BILITOT 0.47 09/07/2017 1322       RADIOGRAPHIC STUDIES: No results found.  ASSESSMENT AND PLAN:  This is a very pleasant 67 years old white male with stage IIIa large B-cell non-Hodgkin lymphoma status post 6 cycles of systemic chemotherapy with CHOP/Rituxan with almost complete response. The patient is currently on observation and he is feeling fine.  Repeat CBC showed no concerning findings except for very mild anemia.  Comprehensive metabolic panel and LDH are still pending. I recommended for the patient to continue on observation with repeat CT scan of the chest, abdomen and pelvis in 6 months for reevaluation  of his disease. For the questionable lump in his throat, unfortunately I do not have any records of these findings and I recommended for the patient to reach out to his primary care physician for referral to ENT or gastroenterology based on the location of that suspicious lesion. He was advised to call immediately if he has any other concerning symptoms in the interval. He will have Port-A-Cath flush every 2 months. The patient voices understanding of current disease status and treatment options and is in agreement with the current care plan. All questions were answered. The patient knows to call the clinic with any problems, questions or concerns. We can certainly see the patient much sooner if necessary. I spent 10 minutes counseling the patient face to face. The total time spent in the appointment was 15 minutes. Disclaimer: This note was dictated with voice recognition software. Similar sounding words can inadvertently be transcribed and may not be corrected upon review.

## 2018-03-10 NOTE — Telephone Encounter (Signed)
Appointments scheduled , contrast material with instructions provided, AVS/Calendar printed per 6/20 los

## 2018-03-11 ENCOUNTER — Inpatient Hospital Stay: Payer: PPO

## 2018-03-11 DIAGNOSIS — Z95828 Presence of other vascular implants and grafts: Secondary | ICD-10-CM

## 2018-03-11 DIAGNOSIS — Z8572 Personal history of non-Hodgkin lymphomas: Secondary | ICD-10-CM | POA: Diagnosis not present

## 2018-03-11 MED ORDER — HEPARIN SOD (PORK) LOCK FLUSH 100 UNIT/ML IV SOLN
500.0000 [IU] | Freq: Once | INTRAVENOUS | Status: AC | PRN
  Administered 2018-03-11: 500 [IU] via INTRAVENOUS
  Filled 2018-03-11: qty 5

## 2018-03-11 MED ORDER — SODIUM CHLORIDE 0.9% FLUSH
10.0000 mL | INTRAVENOUS | Status: DC | PRN
  Administered 2018-03-11: 10 mL via INTRAVENOUS
  Filled 2018-03-11: qty 10

## 2018-03-25 DIAGNOSIS — J449 Chronic obstructive pulmonary disease, unspecified: Secondary | ICD-10-CM | POA: Diagnosis not present

## 2018-03-26 DIAGNOSIS — J449 Chronic obstructive pulmonary disease, unspecified: Secondary | ICD-10-CM | POA: Diagnosis not present

## 2018-03-30 DIAGNOSIS — Z79899 Other long term (current) drug therapy: Secondary | ICD-10-CM | POA: Diagnosis not present

## 2018-03-30 DIAGNOSIS — G8929 Other chronic pain: Secondary | ICD-10-CM | POA: Diagnosis not present

## 2018-03-30 DIAGNOSIS — M545 Low back pain: Secondary | ICD-10-CM | POA: Diagnosis not present

## 2018-04-25 DIAGNOSIS — J449 Chronic obstructive pulmonary disease, unspecified: Secondary | ICD-10-CM | POA: Diagnosis not present

## 2018-04-26 DIAGNOSIS — J449 Chronic obstructive pulmonary disease, unspecified: Secondary | ICD-10-CM | POA: Diagnosis not present

## 2018-04-27 DIAGNOSIS — N200 Calculus of kidney: Secondary | ICD-10-CM | POA: Diagnosis not present

## 2018-04-27 DIAGNOSIS — M545 Low back pain: Secondary | ICD-10-CM | POA: Diagnosis not present

## 2018-04-27 DIAGNOSIS — G8929 Other chronic pain: Secondary | ICD-10-CM | POA: Diagnosis not present

## 2018-04-27 DIAGNOSIS — M519 Unspecified thoracic, thoracolumbar and lumbosacral intervertebral disc disorder: Secondary | ICD-10-CM | POA: Diagnosis not present

## 2018-04-27 DIAGNOSIS — Z79899 Other long term (current) drug therapy: Secondary | ICD-10-CM | POA: Diagnosis not present

## 2018-05-06 DIAGNOSIS — N200 Calculus of kidney: Secondary | ICD-10-CM | POA: Diagnosis not present

## 2018-05-10 ENCOUNTER — Inpatient Hospital Stay: Payer: PPO | Attending: Internal Medicine

## 2018-05-10 VITALS — BP 142/70 | HR 63 | Temp 98.5°F | Resp 18

## 2018-05-10 DIAGNOSIS — Z95828 Presence of other vascular implants and grafts: Secondary | ICD-10-CM

## 2018-05-10 DIAGNOSIS — Z8572 Personal history of non-Hodgkin lymphomas: Secondary | ICD-10-CM | POA: Insufficient documentation

## 2018-05-10 MED ORDER — SODIUM CHLORIDE 0.9% FLUSH
10.0000 mL | INTRAVENOUS | Status: DC | PRN
  Administered 2018-05-10: 10 mL via INTRAVENOUS
  Filled 2018-05-10: qty 10

## 2018-05-10 MED ORDER — HEPARIN SOD (PORK) LOCK FLUSH 100 UNIT/ML IV SOLN
500.0000 [IU] | Freq: Once | INTRAVENOUS | Status: AC | PRN
  Administered 2018-05-10: 500 [IU] via INTRAVENOUS
  Filled 2018-05-10: qty 5

## 2018-05-10 NOTE — Patient Instructions (Signed)
Implanted Port Home Guide An implanted port is a type of central line that is placed under the skin. Central lines are used to provide IV access when treatment or nutrition needs to be given through a person's veins. Implanted ports are used for long-term IV access. An implanted port may be placed because:  You need IV medicine that would be irritating to the small veins in your hands or arms.  You need long-term IV medicines, such as antibiotics.  You need IV nutrition for a long period.  You need frequent blood draws for lab tests.  You need dialysis.  Implanted ports are usually placed in the chest area, but they can also be placed in the upper arm, the abdomen, or the leg. An implanted port has two main parts:  Reservoir. The reservoir is round and will appear as a small, raised area under your skin. The reservoir is the part where a needle is inserted to give medicines or draw blood.  Catheter. The catheter is a thin, flexible tube that extends from the reservoir. The catheter is placed into a large vein. Medicine that is inserted into the reservoir goes into the catheter and then into the vein.  How will I care for my incision site? Do not get the incision site wet. Bathe or shower as directed by your health care provider. How is my port accessed? Special steps must be taken to access the port:  Before the port is accessed, a numbing cream can be placed on the skin. This helps numb the skin over the port site.  Your health care provider uses a sterile technique to access the port. ? Your health care provider must put on a mask and sterile gloves. ? The skin over your port is cleaned carefully with an antiseptic and allowed to dry. ? The port is gently pinched between sterile gloves, and a needle is inserted into the port.  Only "non-coring" port needles should be used to access the port. Once the port is accessed, a blood return should be checked. This helps ensure that the port  is in the vein and is not clogged.  If your port needs to remain accessed for a constant infusion, a clear (transparent) bandage will be placed over the needle site. The bandage and needle will need to be changed every week, or as directed by your health care provider.  Keep the bandage covering the needle clean and dry. Do not get it wet. Follow your health care provider's instructions on how to take a shower or bath while the port is accessed.  If your port does not need to stay accessed, no bandage is needed over the port.  What is flushing? Flushing helps keep the port from getting clogged. Follow your health care provider's instructions on how and when to flush the port. Ports are usually flushed with saline solution or a medicine called heparin. The need for flushing will depend on how the port is used.  If the port is used for intermittent medicines or blood draws, the port will need to be flushed: ? After medicines have been given. ? After blood has been drawn. ? As part of routine maintenance.  If a constant infusion is running, the port may not need to be flushed.  How long will my port stay implanted? The port can stay in for as long as your health care provider thinks it is needed. When it is time for the port to come out, surgery will be   done to remove it. The procedure is similar to the one performed when the port was put in. When should I seek immediate medical care? When you have an implanted port, you should seek immediate medical care if:  You notice a bad smell coming from the incision site.  You have swelling, redness, or drainage at the incision site.  You have more swelling or pain at the port site or the surrounding area.  You have a fever that is not controlled with medicine.  This information is not intended to replace advice given to you by your health care provider. Make sure you discuss any questions you have with your health care provider. Document  Released: 09/07/2005 Document Revised: 02/13/2016 Document Reviewed: 05/15/2013 Elsevier Interactive Patient Education  2017 [employer]  

## 2018-05-25 DIAGNOSIS — K219 Gastro-esophageal reflux disease without esophagitis: Secondary | ICD-10-CM | POA: Diagnosis not present

## 2018-05-25 DIAGNOSIS — J449 Chronic obstructive pulmonary disease, unspecified: Secondary | ICD-10-CM | POA: Diagnosis not present

## 2018-05-25 DIAGNOSIS — M519 Unspecified thoracic, thoracolumbar and lumbosacral intervertebral disc disorder: Secondary | ICD-10-CM | POA: Diagnosis not present

## 2018-05-25 DIAGNOSIS — Z79899 Other long term (current) drug therapy: Secondary | ICD-10-CM | POA: Diagnosis not present

## 2018-05-25 DIAGNOSIS — I10 Essential (primary) hypertension: Secondary | ICD-10-CM | POA: Diagnosis not present

## 2018-05-26 DIAGNOSIS — J449 Chronic obstructive pulmonary disease, unspecified: Secondary | ICD-10-CM | POA: Diagnosis not present

## 2018-05-27 DIAGNOSIS — J449 Chronic obstructive pulmonary disease, unspecified: Secondary | ICD-10-CM | POA: Diagnosis not present

## 2018-06-22 DIAGNOSIS — J449 Chronic obstructive pulmonary disease, unspecified: Secondary | ICD-10-CM | POA: Diagnosis not present

## 2018-06-22 DIAGNOSIS — E039 Hypothyroidism, unspecified: Secondary | ICD-10-CM | POA: Diagnosis not present

## 2018-06-22 DIAGNOSIS — K219 Gastro-esophageal reflux disease without esophagitis: Secondary | ICD-10-CM | POA: Diagnosis not present

## 2018-06-22 DIAGNOSIS — M519 Unspecified thoracic, thoracolumbar and lumbosacral intervertebral disc disorder: Secondary | ICD-10-CM | POA: Diagnosis not present

## 2018-06-22 DIAGNOSIS — Z79899 Other long term (current) drug therapy: Secondary | ICD-10-CM | POA: Diagnosis not present

## 2018-06-25 DIAGNOSIS — J449 Chronic obstructive pulmonary disease, unspecified: Secondary | ICD-10-CM | POA: Diagnosis not present

## 2018-06-26 DIAGNOSIS — J449 Chronic obstructive pulmonary disease, unspecified: Secondary | ICD-10-CM | POA: Diagnosis not present

## 2018-07-11 ENCOUNTER — Inpatient Hospital Stay: Payer: PPO | Attending: Internal Medicine

## 2018-07-11 DIAGNOSIS — Z8572 Personal history of non-Hodgkin lymphomas: Secondary | ICD-10-CM | POA: Insufficient documentation

## 2018-07-11 DIAGNOSIS — Z95828 Presence of other vascular implants and grafts: Secondary | ICD-10-CM

## 2018-07-11 DIAGNOSIS — Z452 Encounter for adjustment and management of vascular access device: Secondary | ICD-10-CM | POA: Diagnosis not present

## 2018-07-11 MED ORDER — SODIUM CHLORIDE 0.9% FLUSH
10.0000 mL | INTRAVENOUS | Status: DC | PRN
  Administered 2018-07-11: 10 mL via INTRAVENOUS
  Filled 2018-07-11: qty 10

## 2018-07-11 MED ORDER — HEPARIN SOD (PORK) LOCK FLUSH 100 UNIT/ML IV SOLN
500.0000 [IU] | Freq: Once | INTRAVENOUS | Status: AC | PRN
  Administered 2018-07-11: 500 [IU] via INTRAVENOUS
  Filled 2018-07-11: qty 5

## 2018-07-11 NOTE — Patient Instructions (Signed)
Implanted Port Home Guide An implanted port is a type of central line that is placed under the skin. Central lines are used to provide IV access when treatment or nutrition needs to be given through a person's veins. Implanted ports are used for long-term IV access. An implanted port may be placed because:  You need IV medicine that would be irritating to the small veins in your hands or arms.  You need long-term IV medicines, such as antibiotics.  You need IV nutrition for a long period.  You need frequent blood draws for lab tests.  You need dialysis.  Implanted ports are usually placed in the chest area, but they can also be placed in the upper arm, the abdomen, or the leg. An implanted port has two main parts:  Reservoir. The reservoir is round and will appear as a small, raised area under your skin. The reservoir is the part where a needle is inserted to give medicines or draw blood.  Catheter. The catheter is a thin, flexible tube that extends from the reservoir. The catheter is placed into a large vein. Medicine that is inserted into the reservoir goes into the catheter and then into the vein.  How will I care for my incision site? Do not get the incision site wet. Bathe or shower as directed by your health care provider. How is my port accessed? Special steps must be taken to access the port:  Before the port is accessed, a numbing cream can be placed on the skin. This helps numb the skin over the port site.  Your health care provider uses a sterile technique to access the port. ? Your health care provider must put on a mask and sterile gloves. ? The skin over your port is cleaned carefully with an antiseptic and allowed to dry. ? The port is gently pinched between sterile gloves, and a needle is inserted into the port.  Only "non-coring" port needles should be used to access the port. Once the port is accessed, a blood return should be checked. This helps ensure that the port  is in the vein and is not clogged.  If your port needs to remain accessed for a constant infusion, a clear (transparent) bandage will be placed over the needle site. The bandage and needle will need to be changed every week, or as directed by your health care provider.  Keep the bandage covering the needle clean and dry. Do not get it wet. Follow your health care provider's instructions on how to take a shower or bath while the port is accessed.  If your port does not need to stay accessed, no bandage is needed over the port.  What is flushing? Flushing helps keep the port from getting clogged. Follow your health care provider's instructions on how and when to flush the port. Ports are usually flushed with saline solution or a medicine called heparin. The need for flushing will depend on how the port is used.  If the port is used for intermittent medicines or blood draws, the port will need to be flushed: ? After medicines have been given. ? After blood has been drawn. ? As part of routine maintenance.  If a constant infusion is running, the port may not need to be flushed.  How long will my port stay implanted? The port can stay in for as long as your health care provider thinks it is needed. When it is time for the port to come out, surgery will be   done to remove it. The procedure is similar to the one performed when the port was put in. When should I seek immediate medical care? When you have an implanted port, you should seek immediate medical care if:  You notice a bad smell coming from the incision site.  You have swelling, redness, or drainage at the incision site.  You have more swelling or pain at the port site or the surrounding area.  You have a fever that is not controlled with medicine.  This information is not intended to replace advice given to you by your health care provider. Make sure you discuss any questions you have with your health care provider. Document  Released: 09/07/2005 Document Revised: 02/13/2016 Document Reviewed: 05/15/2013 Elsevier Interactive Patient Education  2017 [employer]  

## 2018-07-21 DIAGNOSIS — J449 Chronic obstructive pulmonary disease, unspecified: Secondary | ICD-10-CM | POA: Diagnosis not present

## 2018-07-21 DIAGNOSIS — G8929 Other chronic pain: Secondary | ICD-10-CM | POA: Diagnosis not present

## 2018-07-21 DIAGNOSIS — M545 Low back pain: Secondary | ICD-10-CM | POA: Diagnosis not present

## 2018-07-21 DIAGNOSIS — Z79899 Other long term (current) drug therapy: Secondary | ICD-10-CM | POA: Diagnosis not present

## 2018-07-26 DIAGNOSIS — J449 Chronic obstructive pulmonary disease, unspecified: Secondary | ICD-10-CM | POA: Diagnosis not present

## 2018-07-27 DIAGNOSIS — J449 Chronic obstructive pulmonary disease, unspecified: Secondary | ICD-10-CM | POA: Diagnosis not present

## 2018-08-09 DIAGNOSIS — J449 Chronic obstructive pulmonary disease, unspecified: Secondary | ICD-10-CM | POA: Diagnosis not present

## 2018-08-09 DIAGNOSIS — J0100 Acute maxillary sinusitis, unspecified: Secondary | ICD-10-CM | POA: Diagnosis not present

## 2018-08-19 DIAGNOSIS — E559 Vitamin D deficiency, unspecified: Secondary | ICD-10-CM | POA: Diagnosis not present

## 2018-08-19 DIAGNOSIS — Z79899 Other long term (current) drug therapy: Secondary | ICD-10-CM | POA: Diagnosis not present

## 2018-08-19 DIAGNOSIS — R0602 Shortness of breath: Secondary | ICD-10-CM | POA: Diagnosis not present

## 2018-08-19 DIAGNOSIS — M545 Low back pain: Secondary | ICD-10-CM | POA: Diagnosis not present

## 2018-08-19 DIAGNOSIS — G894 Chronic pain syndrome: Secondary | ICD-10-CM | POA: Diagnosis not present

## 2018-08-19 DIAGNOSIS — J018 Other acute sinusitis: Secondary | ICD-10-CM | POA: Diagnosis not present

## 2018-08-19 DIAGNOSIS — M129 Arthropathy, unspecified: Secondary | ICD-10-CM | POA: Diagnosis not present

## 2018-08-19 DIAGNOSIS — G8929 Other chronic pain: Secondary | ICD-10-CM | POA: Diagnosis not present

## 2018-08-19 DIAGNOSIS — R5383 Other fatigue: Secondary | ICD-10-CM | POA: Diagnosis not present

## 2018-08-19 DIAGNOSIS — D539 Nutritional anemia, unspecified: Secondary | ICD-10-CM | POA: Diagnosis not present

## 2018-08-23 ENCOUNTER — Telehealth: Payer: Self-pay | Admitting: Internal Medicine

## 2018-08-23 NOTE — Telephone Encounter (Signed)
Ov notes printed and faxed as requested. Nothing more needed at this time.

## 2018-08-25 DIAGNOSIS — J449 Chronic obstructive pulmonary disease, unspecified: Secondary | ICD-10-CM | POA: Diagnosis not present

## 2018-09-06 ENCOUNTER — Inpatient Hospital Stay: Payer: PPO

## 2018-09-06 ENCOUNTER — Inpatient Hospital Stay: Payer: PPO | Attending: Internal Medicine

## 2018-09-06 ENCOUNTER — Ambulatory Visit (HOSPITAL_COMMUNITY)
Admission: RE | Admit: 2018-09-06 | Discharge: 2018-09-06 | Disposition: A | Discharge status: Home / Self Care | Payer: PPO | Source: Ambulatory Visit | Attending: Internal Medicine | Admitting: Internal Medicine

## 2018-09-06 DIAGNOSIS — N400 Enlarged prostate without lower urinary tract symptoms: Secondary | ICD-10-CM | POA: Insufficient documentation

## 2018-09-06 DIAGNOSIS — E669 Obesity, unspecified: Secondary | ICD-10-CM | POA: Insufficient documentation

## 2018-09-06 DIAGNOSIS — Z79899 Other long term (current) drug therapy: Secondary | ICD-10-CM | POA: Insufficient documentation

## 2018-09-06 DIAGNOSIS — I10 Essential (primary) hypertension: Secondary | ICD-10-CM | POA: Insufficient documentation

## 2018-09-06 DIAGNOSIS — J9809 Other diseases of bronchus, not elsewhere classified: Secondary | ICD-10-CM | POA: Diagnosis not present

## 2018-09-06 DIAGNOSIS — N281 Cyst of kidney, acquired: Secondary | ICD-10-CM | POA: Diagnosis not present

## 2018-09-06 DIAGNOSIS — Z85118 Personal history of other malignant neoplasm of bronchus and lung: Secondary | ICD-10-CM | POA: Diagnosis not present

## 2018-09-06 DIAGNOSIS — C8258 Diffuse follicle center lymphoma, lymph nodes of multiple sites: Secondary | ICD-10-CM | POA: Diagnosis not present

## 2018-09-06 DIAGNOSIS — E039 Hypothyroidism, unspecified: Secondary | ICD-10-CM | POA: Diagnosis not present

## 2018-09-06 DIAGNOSIS — J449 Chronic obstructive pulmonary disease, unspecified: Secondary | ICD-10-CM | POA: Diagnosis not present

## 2018-09-06 DIAGNOSIS — Z9221 Personal history of antineoplastic chemotherapy: Secondary | ICD-10-CM | POA: Insufficient documentation

## 2018-09-06 DIAGNOSIS — I509 Heart failure, unspecified: Secondary | ICD-10-CM | POA: Insufficient documentation

## 2018-09-06 DIAGNOSIS — I700 Atherosclerosis of aorta: Secondary | ICD-10-CM | POA: Diagnosis not present

## 2018-09-06 DIAGNOSIS — C8332 Diffuse large B-cell lymphoma, intrathoracic lymph nodes: Secondary | ICD-10-CM | POA: Insufficient documentation

## 2018-09-06 DIAGNOSIS — N200 Calculus of kidney: Secondary | ICD-10-CM | POA: Diagnosis not present

## 2018-09-06 DIAGNOSIS — F329 Major depressive disorder, single episode, unspecified: Secondary | ICD-10-CM | POA: Diagnosis not present

## 2018-09-06 DIAGNOSIS — I272 Pulmonary hypertension, unspecified: Secondary | ICD-10-CM | POA: Insufficient documentation

## 2018-09-06 DIAGNOSIS — Z8719 Personal history of other diseases of the digestive system: Secondary | ICD-10-CM | POA: Diagnosis not present

## 2018-09-06 LAB — CBC WITH DIFFERENTIAL (CANCER CENTER ONLY)
Abs Immature Granulocytes: 0.05 10*3/uL (ref 0.00–0.07)
BASOS ABS: 0 10*3/uL (ref 0.0–0.1)
Basophils Relative: 0 %
EOS ABS: 0.1 10*3/uL (ref 0.0–0.5)
EOS PCT: 1 %
HEMATOCRIT: 39.3 % (ref 39.0–52.0)
HEMOGLOBIN: 12.6 g/dL — AB (ref 13.0–17.0)
Immature Granulocytes: 1 %
LYMPHS ABS: 0.9 10*3/uL (ref 0.7–4.0)
Lymphocytes Relative: 9 %
MCH: 28.7 pg (ref 26.0–34.0)
MCHC: 32.1 g/dL (ref 30.0–36.0)
MCV: 89.5 fL (ref 80.0–100.0)
Monocytes Absolute: 0.5 10*3/uL (ref 0.1–1.0)
Monocytes Relative: 6 %
NRBC: 0 % (ref 0.0–0.2)
Neutro Abs: 7.7 10*3/uL (ref 1.7–7.7)
Neutrophils Relative %: 83 %
Platelet Count: 190 10*3/uL (ref 150–400)
RBC: 4.39 MIL/uL (ref 4.22–5.81)
RDW: 13.4 % (ref 11.5–15.5)
WBC: 9.3 10*3/uL (ref 4.0–10.5)

## 2018-09-06 LAB — CMP (CANCER CENTER ONLY)
ALBUMIN: 3.9 g/dL (ref 3.5–5.0)
ALK PHOS: 92 U/L (ref 38–126)
ALT: 14 U/L (ref 0–44)
ANION GAP: 8 (ref 5–15)
AST: 15 U/L (ref 15–41)
BILIRUBIN TOTAL: 0.7 mg/dL (ref 0.3–1.2)
BUN: 11 mg/dL (ref 8–23)
CALCIUM: 9.5 mg/dL (ref 8.9–10.3)
CO2: 30 mmol/L (ref 22–32)
Chloride: 104 mmol/L (ref 98–111)
Creatinine: 0.89 mg/dL (ref 0.61–1.24)
GLUCOSE: 110 mg/dL — AB (ref 70–99)
POTASSIUM: 4.4 mmol/L (ref 3.5–5.1)
Sodium: 142 mmol/L (ref 135–145)
TOTAL PROTEIN: 6.9 g/dL (ref 6.5–8.1)

## 2018-09-06 LAB — LACTATE DEHYDROGENASE: LDH: 104 U/L (ref 98–192)

## 2018-09-06 MED ORDER — IOHEXOL 300 MG/ML  SOLN
100.0000 mL | Freq: Once | INTRAMUSCULAR | Status: AC | PRN
  Administered 2018-09-06: 100 mL via INTRAVENOUS

## 2018-09-06 MED ORDER — SODIUM CHLORIDE (PF) 0.9 % IJ SOLN
INTRAMUSCULAR | Status: AC
  Filled 2018-09-06: qty 50

## 2018-09-06 MED ORDER — HEPARIN SOD (PORK) LOCK FLUSH 100 UNIT/ML IV SOLN
500.0000 [IU] | Freq: Once | INTRAVENOUS | Status: AC
  Administered 2018-09-06: 500 [IU] via INTRAVENOUS

## 2018-09-06 MED ORDER — HEPARIN SOD (PORK) LOCK FLUSH 100 UNIT/ML IV SOLN
INTRAVENOUS | Status: AC
  Filled 2018-09-06: qty 5

## 2018-09-08 ENCOUNTER — Inpatient Hospital Stay (HOSPITAL_BASED_OUTPATIENT_CLINIC_OR_DEPARTMENT_OTHER): Payer: PPO | Admitting: Internal Medicine

## 2018-09-08 ENCOUNTER — Other Ambulatory Visit: Payer: Self-pay

## 2018-09-08 ENCOUNTER — Encounter: Payer: Self-pay | Admitting: Internal Medicine

## 2018-09-08 VITALS — BP 119/69 | HR 73 | Temp 98.2°F | Resp 18 | Wt 232.8 lb

## 2018-09-08 DIAGNOSIS — N400 Enlarged prostate without lower urinary tract symptoms: Secondary | ICD-10-CM | POA: Diagnosis not present

## 2018-09-08 DIAGNOSIS — I272 Pulmonary hypertension, unspecified: Secondary | ICD-10-CM

## 2018-09-08 DIAGNOSIS — J449 Chronic obstructive pulmonary disease, unspecified: Secondary | ICD-10-CM

## 2018-09-08 DIAGNOSIS — Z8719 Personal history of other diseases of the digestive system: Secondary | ICD-10-CM | POA: Diagnosis not present

## 2018-09-08 DIAGNOSIS — I700 Atherosclerosis of aorta: Secondary | ICD-10-CM

## 2018-09-08 DIAGNOSIS — Z85118 Personal history of other malignant neoplasm of bronchus and lung: Secondary | ICD-10-CM

## 2018-09-08 DIAGNOSIS — E039 Hypothyroidism, unspecified: Secondary | ICD-10-CM

## 2018-09-08 DIAGNOSIS — C8332 Diffuse large B-cell lymphoma, intrathoracic lymph nodes: Secondary | ICD-10-CM

## 2018-09-08 DIAGNOSIS — I509 Heart failure, unspecified: Secondary | ICD-10-CM | POA: Diagnosis not present

## 2018-09-08 DIAGNOSIS — Z9221 Personal history of antineoplastic chemotherapy: Secondary | ICD-10-CM

## 2018-09-08 DIAGNOSIS — Z79899 Other long term (current) drug therapy: Secondary | ICD-10-CM

## 2018-09-08 DIAGNOSIS — N200 Calculus of kidney: Secondary | ICD-10-CM

## 2018-09-08 DIAGNOSIS — F329 Major depressive disorder, single episode, unspecified: Secondary | ICD-10-CM | POA: Diagnosis not present

## 2018-09-08 DIAGNOSIS — C8258 Diffuse follicle center lymphoma, lymph nodes of multiple sites: Secondary | ICD-10-CM

## 2018-09-08 DIAGNOSIS — E669 Obesity, unspecified: Secondary | ICD-10-CM | POA: Diagnosis not present

## 2018-09-08 DIAGNOSIS — I10 Essential (primary) hypertension: Secondary | ICD-10-CM

## 2018-09-08 NOTE — Progress Notes (Signed)
[facility] [phone]   Fax:([phone]) [phone]  OFFICE PROGRESS NOTE  Iva Lento, PA-C [address] [place] [address]  DIAGNOSIS: Stage III large B-cell non-Hodgkin lymphoma diagnosed in December 2016 and presented with extensive lymphadenopathy involving the neck, chest, abdomen as well as splenomegaly.   PRIOR THERAPY: Systemic chemotherapy with the CHOP/Rituxan every 3 weeks with Neulasta support. Status post 6 cycles.  CURRENT THERAPY: Observation.   INTERVAL HISTORY: [name] 67 y.o. male returns to the clinic today for 6 months follow-up visit accompanied by his wife.  The patient is feeling fine today with no concerning complaints.  He denied having any recent chest pain, shortness of breath, cough or hemoptysis.  He denied having any weight loss or night sweats.  He has no nausea, vomiting, diarrhea or constipation.  He has no headache or visual changes.  He denied having any bleeding issues.  He is here today for evaluation with repeat CT scan of the chest, abdomen and pelvis for restaging of his disease.   MEDICAL HISTORY: Past Medical History:  Diagnosis Date  . BPH (benign prostatic hyperplasia) 2011  . Congestive heart failure ([facility])   . COPD (chronic obstructive pulmonary disease) ([facility])   . Depression   . Drug-induced neutropenia ([facility]) 10/08/2015  . Encounter for antineoplastic chemotherapy 10/08/2015  . Gastrointestinal obstruction ([facility])    Ileus  . Gout   . Hypertension   . Hypothyroid   . Lung cancer ([facility])   . NHL (non-Hodgkin's lymphoma) ([facility]) 09/25/2015  . Obesity   . Polycythemia   . Pulmonary hypertension ([facility])   . Sepsis ([facility]) 01/14/2016  . Streptococcal pneumonia [facility]) August 2009    ALLERGIES:  has No Known Allergies.  MEDICATIONS:  Current Outpatient Medications  Medication Sig Dispense Refill  . albuterol (PROVENTIL) (2.5 MG/3ML) 0.083% nebulizer solution Take 3 mLs (2.5 mg total) by nebulization  every 4 (four) hours as needed for wheezing or shortness of breath. 75 mL 3  . allopurinol (ZYLOPRIM) 300 MG tablet Take 300 mg by mouth daily.    [name] amLODipine (NORVASC) 5 MG tablet Take 1 tablet by mouth Daily.    . betamethasone dipropionate 0.05 % lotion Apply 1 application topically 2 (two) times daily as needed (psorasis).     . budesonide-formoterol (SYMBICORT) 160-4.5 MCG/ACT inhaler Inhale 2 puffs into the lungs 2 (two) times daily. 2 Inhaler 0  . budesonide-formoterol (SYMBICORT) 160-4.5 MCG/ACT inhaler Inhale 2 puffs into the lungs 2 (two) times daily. 1 Inhaler 5  . levothyroxine (SYNTHROID, LEVOTHROID) 150 MCG tablet Take 150 mcg by mouth daily.    [name] lisinopril (PRINIVIL,ZESTRIL) 10 MG tablet Take 1 tablet (10 mg total) by mouth daily. 30 tablet 11  . omeprazole (PRILOSEC) 20 MG capsule Take 1 capsule (20 mg total) by mouth 2 (two) times daily before a meal. 180 capsule 1  . oxyCODONE-acetaminophen (PERCOCET) 10-325 MG tablet Take 1 tablet by mouth every 4 (four) hours as needed for pain.    [name] PROAIR HFA 108 (90 Base) MCG/ACT inhaler Inhale 2 puffs into the lungs every 6 (six) hours as needed for wheezing or shortness of breath. 3 Inhaler 1  . tiotropium (SPIRIVA) 18 MCG inhalation capsule Place 1 capsule (18 mcg total) daily into inhaler and inhale. 90 capsule 1   No current facility-administered medications for this visit.     SURGICAL HISTORY:  Past Surgical History:  Procedure Laterality Date  . APPENDECTOMY    .  BACK SURGERY    . CARDIOVERSION  11/27/2011   Procedure: CARDIOVERSION;  Surgeon: Thayer Headings, MD;  Location: [facility];  Service: Cardiovascular;  Laterality: N/A;  . LUNG CANCER SURGERY  2011    REVIEW OF SYSTEMS:  A comprehensive review of systems was negative.   PHYSICAL EXAMINATION: General appearance: alert, cooperative, appears stated age and no distress Head: Normocephalic, without obvious abnormality, atraumatic Neck: no adenopathy, no JVD, supple,  symmetrical, trachea midline and thyroid not enlarged, symmetric, no tenderness/mass/nodules Lymph nodes: Cervical, supraclavicular, and axillary nodes normal. Resp: clear to auscultation bilaterally Back: symmetric, no curvature. ROM normal. No CVA tenderness. Cardio: regular rate and rhythm, S1, S2 normal, no murmur, click, rub or gallop GI: soft, non-tender; bowel sounds normal; no masses,  no organomegaly Extremities: extremities normal, atraumatic, no cyanosis or edema  ECOG PERFORMANCE STATUS: 1 - Symptomatic but completely ambulatory  Blood pressure 119/69, pulse 73, temperature 98.2 F (36.8 C), temperature source Oral, resp. rate 18, weight 232 lb 12.8 oz (105.6 kg), SpO2 95 %.  LABORATORY DATA: Lab Results  Component Value Date   WBC 9.3 09/06/2018   HGB 12.6 (L) 09/06/2018   HCT 39.3 09/06/2018   MCV 89.5 09/06/2018   PLT 190 09/06/2018      Chemistry      Component Value Date/Time   NA 142 09/06/2018 0825   NA 140 09/07/2017 1322   K 4.4 09/06/2018 0825   K 5.2 No visable hemolysis (H) 09/07/2017 1322   CL 104 09/06/2018 0825   CO2 30 09/06/2018 0825   CO2 33 (H) 09/07/2017 1322   BUN 11 09/06/2018 0825   BUN 13.6 09/07/2017 1322   CREATININE 0.89 09/06/2018 0825   CREATININE 1.0 09/07/2017 1322      Component Value Date/Time   CALCIUM 9.5 09/06/2018 0825   CALCIUM 9.1 09/07/2017 1322   ALKPHOS 92 09/06/2018 0825   ALKPHOS 92 09/07/2017 1322   AST 15 09/06/2018 0825   AST 11 09/07/2017 1322   ALT 14 09/06/2018 0825   ALT 8 09/07/2017 1322   BILITOT 0.7 09/06/2018 0825   BILITOT 0.47 09/07/2017 1322       RADIOGRAPHIC STUDIES: Ct Chest W Contrast  Result Date: 09/06/2018 CLINICAL DATA:  Stage III large B-cell non-Hodgkin's lymphoma diagnosed December 2016. Status post 6 cycles R- CHOP EXAM: CT CHEST, ABDOMEN, AND PELVIS WITH CONTRAST TECHNIQUE: Multidetector CT imaging of the chest, abdomen and pelvis was performed following the standard protocol  during bolus administration of intravenous contrast. CONTRAST:  100 cc Omnipaque COMPARISON:  CT 09/30/2017, PET-CT 02/24/2016 FINDINGS: CT CHEST FINDINGS Cardiovascular: Port in the anterior chest wall with tip in distal SVC. Coronary artery calcification and aortic atherosclerotic calcification. Mediastinum/Nodes: No axillary or supraclavicular adenopathy. No mediastinal adenopathy. No pericardial effusion. Esophagus normal. Lungs/Pleura: Fine reticular pattern at the lung bases. No suspicious nodularity. Mild bronchial thickening in lower lobes. Findings improved from the CT in 1 year prior. Pleural calcification over the RIGHT hemidiaphragm unchanged Musculoskeletal: No aggressive osseous lesion. CT ABDOMEN AND PELVIS FINDINGS Hepatobiliary: No focal hepatic lesion. No biliary ductal dilatation. Gallbladder is normal. Common bile duct is normal. Pancreas: Pancreas is normal. No ductal dilatation. No pancreatic inflammation. Spleen: Low-density lesion in the spleen measuring 3.8 cm decreased in size from PET-CT 03/05/2016 Adrenals/urinary tract: Small 10 mm nodule of the LEFT adrenal gland (image 58/2) is unchanged from 09/07/2017. Large simple cyst lower pole of the LEFT kidney. Small nonobstructing calculus measuring 3 mm in the mid  LEFT kidney. Similar 5 mm calculus in the mid RIGHT kidney which is nonobstructive. Simple fluid attenuation cyst of the RIGHT kidney. Ureters and bladder normal. There is bladder wall thickening. The bladder is partially collapsed. Stomach/Bowel: Stomach, small bowel, appendix, and cecum are normal. The colon and rectosigmoid colon are normal. Vascular/Lymphatic: Abdominal aorta is normal caliber with atherosclerotic calcification. There is no retroperitoneal or periportal lymphadenopathy. No pelvic lymphadenopathy. Reproductive: Prostate normal Other: No free fluid. Musculoskeletal: No aggressive osseous lesion. IMPRESSION: Chest Impression: No evidence of lymphoma recurrence in  thorax. Mild bronchial thickening and interstitial thickening at the lung bases improved from prior. Abdomen / Pelvis Impression: 1. No evidence of lymphoma in the abdomen pelvis. 2. Decrease in size low-density splenic lesion. 3. Bilateral nonobstructing renal calculi. Benign-appearing renal cysts. 4. Bladder wall thickening may relate to nondistention or bladder outlet obstruction. Electronically Signed   By: Suzy Bouchard M.D.   On: 09/06/2018 13:55   Ct Abdomen Pelvis W Contrast  Result Date: 09/06/2018 CLINICAL DATA:  Stage III large B-cell non-Hodgkin's lymphoma diagnosed December 2016. Status post 6 cycles R- CHOP EXAM: CT CHEST, ABDOMEN, AND PELVIS WITH CONTRAST TECHNIQUE: Multidetector CT imaging of the chest, abdomen and pelvis was performed following the standard protocol during bolus administration of intravenous contrast. CONTRAST:  100 cc Omnipaque COMPARISON:  CT 09/30/2017, PET-CT 02/24/2016 FINDINGS: CT CHEST FINDINGS Cardiovascular: Port in the anterior chest wall with tip in distal SVC. Coronary artery calcification and aortic atherosclerotic calcification. Mediastinum/Nodes: No axillary or supraclavicular adenopathy. No mediastinal adenopathy. No pericardial effusion. Esophagus normal. Lungs/Pleura: Fine reticular pattern at the lung bases. No suspicious nodularity. Mild bronchial thickening in lower lobes. Findings improved from the CT in 1 year prior. Pleural calcification over the RIGHT hemidiaphragm unchanged Musculoskeletal: No aggressive osseous lesion. CT ABDOMEN AND PELVIS FINDINGS Hepatobiliary: No focal hepatic lesion. No biliary ductal dilatation. Gallbladder is normal. Common bile duct is normal. Pancreas: Pancreas is normal. No ductal dilatation. No pancreatic inflammation. Spleen: Low-density lesion in the spleen measuring 3.8 cm decreased in size from PET-CT 03/05/2016 Adrenals/urinary tract: Small 10 mm nodule of the LEFT adrenal gland (image 58/2) is unchanged from  09/07/2017. Large simple cyst lower pole of the LEFT kidney. Small nonobstructing calculus measuring 3 mm in the mid LEFT kidney. Similar 5 mm calculus in the mid RIGHT kidney which is nonobstructive. Simple fluid attenuation cyst of the RIGHT kidney. Ureters and bladder normal. There is bladder wall thickening. The bladder is partially collapsed. Stomach/Bowel: Stomach, small bowel, appendix, and cecum are normal. The colon and rectosigmoid colon are normal. Vascular/Lymphatic: Abdominal aorta is normal caliber with atherosclerotic calcification. There is no retroperitoneal or periportal lymphadenopathy. No pelvic lymphadenopathy. Reproductive: Prostate normal Other: No free fluid. Musculoskeletal: No aggressive osseous lesion. IMPRESSION: Chest Impression: No evidence of lymphoma recurrence in thorax. Mild bronchial thickening and interstitial thickening at the lung bases improved from prior. Abdomen / Pelvis Impression: 1. No evidence of lymphoma in the abdomen pelvis. 2. Decrease in size low-density splenic lesion. 3. Bilateral nonobstructing renal calculi. Benign-appearing renal cysts. 4. Bladder wall thickening may relate to nondistention or bladder outlet obstruction. Electronically Signed   By: Suzy Bouchard M.D.   On: 09/06/2018 13:55    ASSESSMENT AND PLAN:  This is a very pleasant 67 years old white male with stage IIIa large B-cell non-Hodgkin lymphoma status post 6 cycles of systemic chemotherapy with CHOP/Rituxan with almost complete response. The patient is currently on observation and he is feeling fine with no concerning  adverse effects. Repeat CT scan of the chest, abdomen pelvis showed no concerning findings for disease recurrence or progression. I discussed the scan results with the patient and his wife I recommended for him to continue on observation with repeat blood work and follow-up visit in 6 months. He was advised to call immediately if he has any concerning symptoms in the  interval. He will have Port-A-Cath flush every 2 months. The patient voices understanding of current disease status and treatment options and is in agreement with the current care plan. All questions were answered. The patient knows to call the clinic with any problems, questions or concerns. We can certainly see the patient much sooner if necessary. I spent 10 minutes counseling the patient face to face. The total time spent in the appointment was 15 minutes. Disclaimer: This note was dictated with voice recognition software. Similar sounding words can inadvertently be transcribed and may not be corrected upon review.

## 2018-09-09 ENCOUNTER — Telehealth: Payer: Self-pay | Admitting: Internal Medicine

## 2018-09-09 NOTE — Telephone Encounter (Signed)
Scheduled appt per 12/19 sch message - sent reminder letter in the mail with appt date and time

## 2018-09-23 DIAGNOSIS — M545 Low back pain: Secondary | ICD-10-CM | POA: Diagnosis not present

## 2018-09-23 DIAGNOSIS — G8929 Other chronic pain: Secondary | ICD-10-CM | POA: Diagnosis not present

## 2018-09-23 DIAGNOSIS — F1120 Opioid dependence, uncomplicated: Secondary | ICD-10-CM | POA: Diagnosis not present

## 2018-09-23 DIAGNOSIS — Z79899 Other long term (current) drug therapy: Secondary | ICD-10-CM | POA: Diagnosis not present

## 2018-09-25 DIAGNOSIS — J449 Chronic obstructive pulmonary disease, unspecified: Secondary | ICD-10-CM | POA: Diagnosis not present

## 2018-09-29 ENCOUNTER — Telehealth: Payer: Self-pay | Admitting: Internal Medicine

## 2018-09-29 NOTE — Telephone Encounter (Signed)
Called and spoke to [place] with [facility], who stated that OV notes were not received on 08/22/18. Raquel Sarna has requested that OV notes be refax to [phone] [place]. 11/18/17 OV notes have been faxed to provided fax number.  Nothing further is needed at this time.

## 2018-09-30 DIAGNOSIS — J189 Pneumonia, unspecified organism: Secondary | ICD-10-CM | POA: Diagnosis not present

## 2018-10-21 DIAGNOSIS — M545 Low back pain: Secondary | ICD-10-CM | POA: Diagnosis not present

## 2018-10-21 DIAGNOSIS — Z79891 Long term (current) use of opiate analgesic: Secondary | ICD-10-CM | POA: Diagnosis not present

## 2018-10-21 DIAGNOSIS — Z79899 Other long term (current) drug therapy: Secondary | ICD-10-CM | POA: Diagnosis not present

## 2018-10-21 DIAGNOSIS — G8929 Other chronic pain: Secondary | ICD-10-CM | POA: Diagnosis not present

## 2018-10-21 DIAGNOSIS — F1120 Opioid dependence, uncomplicated: Secondary | ICD-10-CM | POA: Diagnosis not present

## 2018-10-26 DIAGNOSIS — J449 Chronic obstructive pulmonary disease, unspecified: Secondary | ICD-10-CM | POA: Diagnosis not present

## 2018-10-28 ENCOUNTER — Telehealth: Payer: Self-pay | Admitting: Internal Medicine

## 2018-10-28 ENCOUNTER — Encounter: Payer: Self-pay | Admitting: Pulmonary Disease

## 2018-10-28 ENCOUNTER — Ambulatory Visit (INDEPENDENT_AMBULATORY_CARE_PROVIDER_SITE_OTHER): Payer: PPO | Admitting: Pulmonary Disease

## 2018-10-28 VITALS — BP 138/72 | HR 78 | Ht 72.0 in | Wt 233.8 lb

## 2018-10-28 DIAGNOSIS — J449 Chronic obstructive pulmonary disease, unspecified: Secondary | ICD-10-CM | POA: Diagnosis not present

## 2018-10-28 DIAGNOSIS — J9611 Chronic respiratory failure with hypoxia: Secondary | ICD-10-CM

## 2018-10-28 DIAGNOSIS — R0602 Shortness of breath: Secondary | ICD-10-CM | POA: Diagnosis not present

## 2018-10-28 DIAGNOSIS — G8929 Other chronic pain: Secondary | ICD-10-CM | POA: Diagnosis not present

## 2018-10-28 DIAGNOSIS — Z79891 Long term (current) use of opiate analgesic: Secondary | ICD-10-CM | POA: Diagnosis not present

## 2018-10-28 DIAGNOSIS — F1120 Opioid dependence, uncomplicated: Secondary | ICD-10-CM | POA: Diagnosis not present

## 2018-10-28 DIAGNOSIS — M5416 Radiculopathy, lumbar region: Secondary | ICD-10-CM | POA: Diagnosis not present

## 2018-10-28 NOTE — Progress Notes (Signed)
@Patient  ID: [name], male    DOB: Mar 13, 1951, 68 y.o.   MRN: [id number]  Chief Complaint  Patient presents with  . COPD    Feels like medication has been working since last visit. Needs DMV form to be completed for work.    Referring provider: Tera Partridge  HPI:  68 year old male former smoker (quit 2003) followed in our office for gold 3 COPD, chronic respiratory failure  PMH: History of squamous cell lung cancer 2011 status post left upper lobe resection, stage IIIa large B-cell non-Hodgkin lymphoma status post 6 cycles of systemic chemotherapy with CHOP/Rituxan (in 2016) with almost complete response - The patient is currently on observation Smoker/ Smoking History: Former smoker.  Quit 2003.  129-pack-year smoking history Maintenance: Symbicort 160, Spiriva 18 mcg Pt of: Dr. Chase Caller  10/28/2018  - Visit   68 year old male former smoker presenting to our office today for follow-up visit for clearance of DMV forms.  Patient reports that since last being seen in our office which was in 2018 patient has been doing quite well.  Patient reports that he has been managed well with his Symbicort 160 as well as his Spiriva HandiHaler.  Patient reports regular follow-up with primary care.  Patient is also had regular follow-up with oncology.  Patient is currently in observation for his stage IIIa Large B cell non-Hodgkin's lymphoma.  Patient reports he occasionally has use rescue inhaler about 2-3 times a week.  Last antibiotic and prednisone use was about 3 months ago in urgent care for suspected COPD exacerbation.  Patient currently works as a Administrator and is here to be able to get DMV forms completed.  MMRC - Breathlessness Score 0 - I will get breathless with strenuous exercise    Tests:  09/06/2018-CT chest with contrast- no evidence of lymphoma recurrence in thorax, mild bronchial wall thickening and interstitial thickening at the lung bases improved from  prior   12/18/2015-pulmonary function test- FVC 2.75 (55% predicted), postbronchodilator ratio 56, postbronchodilator FEV1 1.66 (45% predicted), no significant bronchodilator response, mid flow reversibility, DLCO 33 >>>Severe obstructive airways disease, Severe diffusion defect  10/28/2018- office spirometry- FVC 2.6 (53% predicted), ratio 63, FEV1 1.6 (45% predicted)  09/30/2015-echocardiogram-LV ejection fraction 60 to 65%, mild LVH, grade 1 diastolic dysfunction   FENO:  No results found for: NITRICOXIDE  PFT: PFT Results Latest Ref Rng & Units 12/18/2015  FVC-Pre L 2.75  FVC-Predicted Pre % 55  FVC-Post L 2.99  FVC-Predicted Post % 60  Pre FEV1/FVC % % 56  Post FEV1/FCV % % 56  FEV1-Pre L 1.53  FEV1-Predicted Pre % 41  FEV1-Post L 1.66  DLCO UNC% % 33  DLCO COR %Predicted % 65  TLC L 6.25  TLC % Predicted % 84  RV % Predicted % 143    Imaging: No results found.    Specialty Problems      Pulmonary Problems   COPD (chronic obstructive pulmonary disease) (HCC)     Gold stage 2. MM genoptype CAT scpore 7 08/29/2012       BRONCHOPNEUMONIA ORGANISM UNSPECIFIED           PULMONARY NODULE, SOLITARY    Qualifier: Diagnosis of  By: Chase Caller MD, [name]        Squamous cell carcinoma lung ([place])    S/p LUL resection Dr [name] dec 2011       Recurrent pneumonia   Chronic respiratory failure with hypoxia (HCC)   COPD, severe ([place])  12/18/2015-pulmonary function test- FVC 2.75 (55% predicted), postbronchodilator ratio 56, postbronchodilator FEV1 1.66 (45% predicted), no significant bronchodilator response, mid flow reversibility, DLCO 33 >>>Severe obstructive airways disease, Severe diffusion defect       PNA (pneumonia)   Acute on chronic respiratory failure with hypoxia (HCC)   COPD with acute exacerbation (HCC)   Lobar pneumonia (HCC)      No Known Allergies  Immunization History  Administered Date(s) Administered  . Influenza Whole 06/21/2009,  08/30/2011  . Influenza, High Dose Seasonal PF 06/10/2017  . Influenza-Unspecified 09/16/2015, 05/13/2016  . Pneumococcal Conjugate-13 08/06/2017  . Pneumococcal Polysaccharide-23 05/22/2008  . Tdap 12/03/2011   Patient needs flu vaccine  Past Medical History:  Diagnosis Date  . BPH (benign prostatic hyperplasia) 2011  . Congestive heart failure ([place])   . COPD (chronic obstructive pulmonary disease) ([place])   . Depression   . Drug-induced neutropenia ([place]) 10/08/2015  . Encounter for antineoplastic chemotherapy 10/08/2015  . Gastrointestinal obstruction (HCC)    Ileus  . Gout   . Hypertension   . Hypothyroid   . Lung cancer ([place])   . NHL (non-Hodgkin's lymphoma) ([place]) 09/25/2015  . Obesity   . Polycythemia   . Pulmonary hypertension ([place])   . Sepsis ([place]) 01/14/2016  . Streptococcal pneumonia [employer]) August 2009    Tobacco History: Social History   Tobacco Use  Smoking Status Former Smoker  . Packs/day: 3.00  . Years: 43.00  . Pack years: 129.00  . Types: Cigarettes  . Last attempt to quit: 10/22/2001  . Years since quitting: 17.0  Smokeless Tobacco Never Used   Counseling given: Yes  Continue to not smoke  Outpatient Encounter Medications as of 10/28/2018  Medication Sig  . albuterol (PROVENTIL) (2.5 MG/3ML) 0.083% nebulizer solution Take 3 mLs (2.5 mg total) by nebulization every 4 (four) hours as needed for wheezing or shortness of breath.  . allopurinol (ZYLOPRIM) 300 MG tablet Take 300 mg by mouth daily.  [name] amLODipine (NORVASC) 5 MG tablet Take 1 tablet by mouth Daily.  . betamethasone dipropionate 0.05 % lotion Apply 1 application topically 2 (two) times daily as needed (psorasis).   . budesonide-formoterol (SYMBICORT) 160-4.5 MCG/ACT inhaler Inhale 2 puffs into the lungs 2 (two) times daily.  [name] levothyroxine (SYNTHROID, LEVOTHROID) 150 MCG tablet Take 150 mcg by mouth daily.  [name] lisinopril (PRINIVIL,ZESTRIL) 10 MG tablet Take 1 tablet (10 mg total) by mouth daily.  [name]  omeprazole (PRILOSEC) 20 MG capsule Take 1 capsule (20 mg total) by mouth 2 (two) times daily before a meal.  . oxyCODONE-acetaminophen (PERCOCET) 10-325 MG tablet Take 1 tablet by mouth every 4 (four) hours as needed for pain.  [name] PROAIR HFA 108 (90 Base) MCG/ACT inhaler Inhale 2 puffs into the lungs every 6 (six) hours as needed for wheezing or shortness of breath.  . tiotropium (SPIRIVA) 18 MCG inhalation capsule Place 1 capsule (18 mcg total) daily into inhaler and inhale.  . [DISCONTINUED] budesonide-formoterol (SYMBICORT) 160-4.5 MCG/ACT inhaler Inhale 2 puffs into the lungs 2 (two) times daily. (Patient not taking: Reported on 10/28/2018)   No facility-administered encounter medications on file as of 10/28/2018.      Review of Systems  Review of Systems  Constitutional: Positive for fatigue. Negative for activity change, chills, fever and unexpected weight change.  HENT: Negative for congestion, postnasal drip, sneezing and sore throat.   Eyes: Negative.   Respiratory: Negative for cough, shortness of breath and wheezing.   Cardiovascular: Negative for chest pain and  palpitations.  Gastrointestinal: Negative for constipation, diarrhea, nausea and vomiting.  Endocrine: Negative.   Musculoskeletal: Negative.   Skin: Negative.   Neurological: Negative for dizziness and headaches.  Psychiatric/Behavioral: Negative.  Negative for dysphoric mood. The patient is not nervous/anxious.   All other systems reviewed and are negative.    Physical Exam  BP 138/72 (BP Location: Left Arm, Patient Position: Sitting, Cuff Size: Normal)   Pulse 78   Ht 6' (1.829 m)   Wt 233 lb 12.8 oz (106.1 kg)   SpO2 96%   BMI 31.71 kg/m   Wt Readings from Last 5 Encounters:  10/28/18 233 lb 12.8 oz (106.1 kg)  09/08/18 232 lb 12.8 oz (105.6 kg)  03/10/18 240 lb 3.2 oz (109 kg)  11/18/17 248 lb (112.5 kg)  10/21/17 241 lb 12.8 oz (109.7 kg)     Physical Exam  Constitutional: He is oriented to person,  place, and time and well-developed, well-nourished, and in no distress. No distress.  HENT:  Head: Normocephalic and atraumatic.  Right Ear: Hearing, tympanic membrane, external ear and ear canal normal.  Left Ear: Hearing, tympanic membrane, external ear and ear canal normal.  Nose: Nose normal. Right sinus exhibits no maxillary sinus tenderness and no frontal sinus tenderness. Left sinus exhibits no maxillary sinus tenderness and no frontal sinus tenderness.  Mouth/Throat: Uvula is midline and oropharynx is clear and moist.  Eyes: Pupils are equal, round, and reactive to light.  Neck: Normal range of motion. Neck supple.  Cardiovascular: Normal rate, regular rhythm and normal heart sounds.  Pulmonary/Chest: Effort normal and breath sounds normal. No accessory muscle usage. No respiratory distress. He has no decreased breath sounds. He has no wheezes. He has no rhonchi. He has no rales.  Musculoskeletal: Normal range of motion.        General: No edema.  Lymphadenopathy:    He has no cervical adenopathy.  Neurological: He is alert and oriented to person, place, and time. Gait normal.  Skin: Skin is warm and dry. He is not diaphoretic.  Psychiatric: Mood, memory, affect and judgment normal.  Nursing note and vitals reviewed.     Lab Results:  CBC    Component Value Date/Time   WBC 9.3 09/06/2018 0825   WBC 5.1 03/10/2018 0826   RBC 4.39 09/06/2018 0825   HGB 12.6 (L) 09/06/2018 0825   HGB 12.9 (L) 09/07/2017 1322   HCT 39.3 09/06/2018 0825   HCT 40.7 09/07/2017 1322   PLT 190 09/06/2018 0825   PLT 160 09/07/2017 1322   MCV 89.5 09/06/2018 0825   MCV 92.3 09/07/2017 1322   MCH 28.7 09/06/2018 0825   MCHC 32.1 09/06/2018 0825   RDW 13.4 09/06/2018 0825   RDW 13.8 09/07/2017 1322   LYMPHSABS 0.9 09/06/2018 0825   LYMPHSABS 1.7 09/07/2017 1322   MONOABS 0.5 09/06/2018 0825   MONOABS 0.6 09/07/2017 1322   EOSABS 0.1 09/06/2018 0825   EOSABS 0.3 09/07/2017 1322   BASOSABS  0.0 09/06/2018 0825   BASOSABS 0.0 09/07/2017 1322    BMET    Component Value Date/Time   NA 142 09/06/2018 0825   NA 140 09/07/2017 1322   K 4.4 09/06/2018 0825   K 5.2 No visable hemolysis (H) 09/07/2017 1322   CL 104 09/06/2018 0825   CO2 30 09/06/2018 0825   CO2 33 (H) 09/07/2017 1322   GLUCOSE 110 (H) 09/06/2018 0825   GLUCOSE 87 09/07/2017 1322   BUN 11 09/06/2018 0825   BUN 13.6  09/07/2017 1322   CREATININE 0.89 09/06/2018 0825   CREATININE 1.0 09/07/2017 1322   CALCIUM 9.5 09/06/2018 0825   CALCIUM 9.1 09/07/2017 1322   GFRNONAA >60 09/06/2018 0825   GFRAA >60 09/06/2018 0825    BNP    Component Value Date/Time   BNP 45.0 09/08/2015 0957    ProBNP    Component Value Date/Time   PROBNP 164.0 (H) 05/03/2008 0400      Assessment & Plan:   Discussed with patient today, as well as provided letter stating that I believe it is okay for patient to proceed forward with operating CMV based off of pulmonary function test, walk test in office today, office spirometry today, mMRC of 0 today, and patient's overall well management of COPD.    If symptoms change in the short/long-term or patient starts having worsening symptoms of COPD needs to present to our office for follow-up.  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  COPD, severe ([place]) Assessment: Gold III COPD based off of March/2017 pulmonary function test mMRC 0 today Lungs clear to auscultation bilaterally Patient reports adherence to Symbicort 160 and Spiriva HandiHaler  Plan: Spirometry in office today Walk in office today Continue Symbicort 162 puffs twice daily Continue Spiriva HandiHaler  Can use rescue inhaler as needed for shortness of breath and wheezing Follow-up with Dr. Chase Caller in 6 months   Chronic respiratory failure with hypoxia ([place]) Assessment: Maintained on  nocturnal oxygen of 2 L via nasal cannula Satting 96% on room air today 2017 PFT shows a DLCO of 33  Plan: Walk in office today      Lauraine Rinne, NP 10/28/2018   This appointment was 35 min long with over 50% of the time in direct face-to-face patient care, assessment, plan of care, and follow-up.

## 2018-10-28 NOTE — Telephone Encounter (Signed)
Will close encounter as nothing further is needed.  

## 2018-10-28 NOTE — Progress Notes (Signed)
Discussed results with patient in office.  Nothing further is needed at this time.  Billyjack Trompeter FNP  

## 2018-10-28 NOTE — Assessment & Plan Note (Addendum)
Assessment: Gold III COPD based off of March/2017 pulmonary function test mMRC 0 today Lungs clear to auscultation bilaterally Patient reports adherence to Symbicort 160 and Spiriva HandiHaler  Plan: Spirometry in office today >>> Stable from 2017 pulmonary function test Walk in office today >>> Maintain oxygen saturations and was able to complete 3 laps Continue Symbicort 160 2 puffs twice daily Continue Spiriva HandiHaler  Can use rescue inhaler as needed for shortness of breath and wheezing Follow-up with Dr. Chase Caller in 6 months

## 2018-10-28 NOTE — Telephone Encounter (Signed)
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  Needs to fax form by 12pm 10/31/2018.  His last OV was 11/18/17 with SG.  Was instructed to f/u in 3 months.  Pt is scheduled for an OV at 230pm with Wyn Quaker.  Forwarding to [employer] for [employer].

## 2018-10-28 NOTE — Telephone Encounter (Signed)
[place] noted.  Aaron Edelman

## 2018-10-28 NOTE — Assessment & Plan Note (Addendum)
Assessment: Maintained on nocturnal oxygen of 2 L via nasal cannula Satting 96% on room air today 2017 PFT shows a DLCO of 33  Plan: Walk in office today >>> Patient completed 3 laps on room air and did not desaturate, maintain oxygen saturations greater than 91%

## 2018-10-28 NOTE — Patient Instructions (Addendum)
Spirometry today in office   Walk in office today on room air  >>> Patient completed 3 laps on room air maintain oxygen saturations greater than 91%  Continue Symbicort 160 >>> 2 puffs in the morning right when you wake up, rinse out your mouth after use, 12 hours later 2 puffs, rinse after use >>> Take this daily, no matter what >>> This is not a rescue inhaler   Spiriva Handihaler daily   Continue oxygen therapy as prescribed - 2L at night  >>>maintain oxygen saturations greater than 88 percent  >>>if unable to maintain oxygen saturations please contact the office  >>>do not smoke with oxygen  >>>can use nasal saline gel or nasal saline rinses to moisturize nose if oxygen causes dryness  Note your daily symptoms > remember "red flags" for COPD:   >>>Increase in cough >>>increase in sputum production >>>increase in shortness of breath or activity  intolerance.   If you notice these symptoms, please call the office to be seen.    Follow-up with Dr. Chase Caller in 6 months    It is flu season:   >>>Remember to be washing your hands regularly, using hand sanitizer, be careful to use around herself with has contact with people who are sick will increase her chances of getting sick yourself. >>> Best ways to protect herself from the flu: Receive the yearly flu vaccine, practice good hand hygiene washing with soap and also using hand sanitizer when available, eat a nutritious meals, get adequate rest, hydrate appropriately   Please contact the office if your symptoms worsen or you have concerns that you are not improving.   Thank you for choosing [facility] for your healthcare, and for allowing [place] to partner with you on your healthcare journey. I am thankful to be able to provide care to you today.   Wyn Quaker FNP-C

## 2018-11-10 ENCOUNTER — Inpatient Hospital Stay: Payer: PPO | Attending: Internal Medicine

## 2018-11-10 DIAGNOSIS — C8332 Diffuse large B-cell lymphoma, intrathoracic lymph nodes: Secondary | ICD-10-CM | POA: Diagnosis not present

## 2018-11-10 DIAGNOSIS — Z95828 Presence of other vascular implants and grafts: Secondary | ICD-10-CM

## 2018-11-10 DIAGNOSIS — Z452 Encounter for adjustment and management of vascular access device: Secondary | ICD-10-CM | POA: Insufficient documentation

## 2018-11-10 MED ORDER — SODIUM CHLORIDE 0.9% FLUSH
10.0000 mL | INTRAVENOUS | Status: DC | PRN
  Administered 2018-11-10: 10 mL via INTRAVENOUS
  Filled 2018-11-10: qty 10

## 2018-11-10 MED ORDER — HEPARIN SOD (PORK) LOCK FLUSH 100 UNIT/ML IV SOLN
500.0000 [IU] | Freq: Once | INTRAVENOUS | Status: AC | PRN
  Administered 2018-11-10: 500 [IU] via INTRAVENOUS
  Filled 2018-11-10: qty 5

## 2018-11-18 DIAGNOSIS — J449 Chronic obstructive pulmonary disease, unspecified: Secondary | ICD-10-CM | POA: Diagnosis not present

## 2018-11-18 DIAGNOSIS — M5416 Radiculopathy, lumbar region: Secondary | ICD-10-CM | POA: Diagnosis not present

## 2018-11-18 DIAGNOSIS — Z79899 Other long term (current) drug therapy: Secondary | ICD-10-CM | POA: Diagnosis not present

## 2018-11-18 DIAGNOSIS — M109 Gout, unspecified: Secondary | ICD-10-CM | POA: Diagnosis not present

## 2018-11-18 DIAGNOSIS — G8929 Other chronic pain: Secondary | ICD-10-CM | POA: Diagnosis not present

## 2018-11-24 DIAGNOSIS — J449 Chronic obstructive pulmonary disease, unspecified: Secondary | ICD-10-CM | POA: Diagnosis not present

## 2018-12-08 ENCOUNTER — Telehealth: Payer: Self-pay | Admitting: *Deleted

## 2018-12-08 NOTE — Telephone Encounter (Signed)
Medical records faxed to [facility]; [place] [phone]

## 2018-12-16 DIAGNOSIS — I10 Essential (primary) hypertension: Secondary | ICD-10-CM | POA: Diagnosis not present

## 2018-12-16 DIAGNOSIS — R5383 Other fatigue: Secondary | ICD-10-CM | POA: Diagnosis not present

## 2018-12-16 DIAGNOSIS — Z79891 Long term (current) use of opiate analgesic: Secondary | ICD-10-CM | POA: Diagnosis not present

## 2018-12-16 DIAGNOSIS — M129 Arthropathy, unspecified: Secondary | ICD-10-CM | POA: Diagnosis not present

## 2018-12-16 DIAGNOSIS — J449 Chronic obstructive pulmonary disease, unspecified: Secondary | ICD-10-CM | POA: Diagnosis not present

## 2018-12-16 DIAGNOSIS — J018 Other acute sinusitis: Secondary | ICD-10-CM | POA: Diagnosis not present

## 2018-12-16 DIAGNOSIS — M545 Low back pain: Secondary | ICD-10-CM | POA: Diagnosis not present

## 2018-12-16 DIAGNOSIS — Z79899 Other long term (current) drug therapy: Secondary | ICD-10-CM | POA: Diagnosis not present

## 2018-12-16 DIAGNOSIS — G8929 Other chronic pain: Secondary | ICD-10-CM | POA: Diagnosis not present

## 2018-12-16 DIAGNOSIS — R0602 Shortness of breath: Secondary | ICD-10-CM | POA: Diagnosis not present

## 2018-12-16 DIAGNOSIS — D539 Nutritional anemia, unspecified: Secondary | ICD-10-CM | POA: Diagnosis not present

## 2018-12-16 DIAGNOSIS — E559 Vitamin D deficiency, unspecified: Secondary | ICD-10-CM | POA: Diagnosis not present

## 2018-12-25 DIAGNOSIS — Z6827 Body mass index (BMI) 27.0-27.9, adult: Secondary | ICD-10-CM | POA: Diagnosis not present

## 2018-12-25 DIAGNOSIS — Z299 Encounter for prophylactic measures, unspecified: Secondary | ICD-10-CM | POA: Diagnosis not present

## 2018-12-25 DIAGNOSIS — J301 Allergic rhinitis due to pollen: Secondary | ICD-10-CM | POA: Diagnosis not present

## 2018-12-25 DIAGNOSIS — J3089 Other allergic rhinitis: Secondary | ICD-10-CM | POA: Diagnosis not present

## 2018-12-25 DIAGNOSIS — I10 Essential (primary) hypertension: Secondary | ICD-10-CM | POA: Diagnosis not present

## 2018-12-25 DIAGNOSIS — E1165 Type 2 diabetes mellitus with hyperglycemia: Secondary | ICD-10-CM | POA: Diagnosis not present

## 2018-12-25 DIAGNOSIS — R42 Dizziness and giddiness: Secondary | ICD-10-CM | POA: Diagnosis not present

## 2018-12-25 DIAGNOSIS — J449 Chronic obstructive pulmonary disease, unspecified: Secondary | ICD-10-CM | POA: Diagnosis not present

## 2019-01-09 ENCOUNTER — Inpatient Hospital Stay: Payer: PPO | Attending: Internal Medicine

## 2019-01-09 ENCOUNTER — Other Ambulatory Visit: Payer: Self-pay

## 2019-01-09 DIAGNOSIS — C8332 Diffuse large B-cell lymphoma, intrathoracic lymph nodes: Secondary | ICD-10-CM | POA: Diagnosis not present

## 2019-01-09 DIAGNOSIS — Z452 Encounter for adjustment and management of vascular access device: Secondary | ICD-10-CM | POA: Insufficient documentation

## 2019-01-09 DIAGNOSIS — Z95828 Presence of other vascular implants and grafts: Secondary | ICD-10-CM

## 2019-01-09 MED ORDER — HEPARIN SOD (PORK) LOCK FLUSH 100 UNIT/ML IV SOLN
500.0000 [IU] | Freq: Once | INTRAVENOUS | Status: AC | PRN
  Administered 2019-01-09: 500 [IU] via INTRAVENOUS
  Filled 2019-01-09: qty 5

## 2019-01-09 MED ORDER — SODIUM CHLORIDE 0.9% FLUSH
10.0000 mL | INTRAVENOUS | Status: DC | PRN
  Administered 2019-01-09: 14:00:00 10 mL via INTRAVENOUS
  Filled 2019-01-09: qty 10

## 2019-01-13 DIAGNOSIS — F1120 Opioid dependence, uncomplicated: Secondary | ICD-10-CM | POA: Diagnosis not present

## 2019-01-13 DIAGNOSIS — Z79891 Long term (current) use of opiate analgesic: Secondary | ICD-10-CM | POA: Diagnosis not present

## 2019-01-13 DIAGNOSIS — Z79899 Other long term (current) drug therapy: Secondary | ICD-10-CM | POA: Diagnosis not present

## 2019-01-13 DIAGNOSIS — M545 Low back pain: Secondary | ICD-10-CM | POA: Diagnosis not present

## 2019-01-13 DIAGNOSIS — G8929 Other chronic pain: Secondary | ICD-10-CM | POA: Diagnosis not present

## 2019-01-23 ENCOUNTER — Other Ambulatory Visit: Payer: Self-pay | Admitting: Acute Care

## 2019-01-24 DIAGNOSIS — J449 Chronic obstructive pulmonary disease, unspecified: Secondary | ICD-10-CM | POA: Diagnosis not present

## 2019-02-08 DIAGNOSIS — M25561 Pain in right knee: Secondary | ICD-10-CM | POA: Diagnosis not present

## 2019-02-08 DIAGNOSIS — M25551 Pain in right hip: Secondary | ICD-10-CM | POA: Diagnosis not present

## 2019-02-08 DIAGNOSIS — Z026 Encounter for examination for insurance purposes: Secondary | ICD-10-CM | POA: Diagnosis not present

## 2019-02-10 DIAGNOSIS — G8929 Other chronic pain: Secondary | ICD-10-CM | POA: Diagnosis not present

## 2019-02-10 DIAGNOSIS — R0602 Shortness of breath: Secondary | ICD-10-CM | POA: Diagnosis not present

## 2019-02-10 DIAGNOSIS — Z79899 Other long term (current) drug therapy: Secondary | ICD-10-CM | POA: Diagnosis not present

## 2019-02-10 DIAGNOSIS — F1120 Opioid dependence, uncomplicated: Secondary | ICD-10-CM | POA: Diagnosis not present

## 2019-02-10 DIAGNOSIS — Z1159 Encounter for screening for other viral diseases: Secondary | ICD-10-CM | POA: Diagnosis not present

## 2019-02-10 DIAGNOSIS — I10 Essential (primary) hypertension: Secondary | ICD-10-CM | POA: Diagnosis not present

## 2019-02-10 DIAGNOSIS — Z0000 Encounter for general adult medical examination without abnormal findings: Secondary | ICD-10-CM | POA: Diagnosis not present

## 2019-02-10 DIAGNOSIS — Z79891 Long term (current) use of opiate analgesic: Secondary | ICD-10-CM | POA: Diagnosis not present

## 2019-02-10 DIAGNOSIS — M545 Low back pain: Secondary | ICD-10-CM | POA: Diagnosis not present

## 2019-02-24 DIAGNOSIS — J449 Chronic obstructive pulmonary disease, unspecified: Secondary | ICD-10-CM | POA: Diagnosis not present

## 2019-03-09 ENCOUNTER — Inpatient Hospital Stay: Payer: PPO

## 2019-03-09 ENCOUNTER — Other Ambulatory Visit: Payer: PPO

## 2019-03-09 ENCOUNTER — Inpatient Hospital Stay: Payer: PPO | Attending: Internal Medicine | Admitting: Internal Medicine

## 2019-03-09 ENCOUNTER — Telehealth: Payer: Self-pay | Admitting: Internal Medicine

## 2019-03-09 ENCOUNTER — Encounter: Payer: Self-pay | Admitting: Internal Medicine

## 2019-03-09 ENCOUNTER — Other Ambulatory Visit: Payer: Self-pay

## 2019-03-09 VITALS — BP 145/80 | HR 87 | Temp 98.2°F | Resp 18 | Wt 233.1 lb

## 2019-03-09 DIAGNOSIS — J449 Chronic obstructive pulmonary disease, unspecified: Secondary | ICD-10-CM | POA: Diagnosis not present

## 2019-03-09 DIAGNOSIS — N400 Enlarged prostate without lower urinary tract symptoms: Secondary | ICD-10-CM

## 2019-03-09 DIAGNOSIS — Z9221 Personal history of antineoplastic chemotherapy: Secondary | ICD-10-CM | POA: Insufficient documentation

## 2019-03-09 DIAGNOSIS — R0982 Postnasal drip: Secondary | ICD-10-CM | POA: Diagnosis not present

## 2019-03-09 DIAGNOSIS — I272 Pulmonary hypertension, unspecified: Secondary | ICD-10-CM | POA: Diagnosis not present

## 2019-03-09 DIAGNOSIS — R05 Cough: Secondary | ICD-10-CM | POA: Diagnosis not present

## 2019-03-09 DIAGNOSIS — Z8719 Personal history of other diseases of the digestive system: Secondary | ICD-10-CM

## 2019-03-09 DIAGNOSIS — I10 Essential (primary) hypertension: Secondary | ICD-10-CM | POA: Insufficient documentation

## 2019-03-09 DIAGNOSIS — E039 Hypothyroidism, unspecified: Secondary | ICD-10-CM | POA: Diagnosis not present

## 2019-03-09 DIAGNOSIS — F329 Major depressive disorder, single episode, unspecified: Secondary | ICD-10-CM

## 2019-03-09 DIAGNOSIS — C8332 Diffuse large B-cell lymphoma, intrathoracic lymph nodes: Secondary | ICD-10-CM

## 2019-03-09 DIAGNOSIS — D702 Other drug-induced agranulocytosis: Secondary | ICD-10-CM | POA: Insufficient documentation

## 2019-03-09 DIAGNOSIS — Z85118 Personal history of other malignant neoplasm of bronchus and lung: Secondary | ICD-10-CM | POA: Diagnosis not present

## 2019-03-09 DIAGNOSIS — I509 Heart failure, unspecified: Secondary | ICD-10-CM | POA: Diagnosis not present

## 2019-03-09 DIAGNOSIS — Z79899 Other long term (current) drug therapy: Secondary | ICD-10-CM

## 2019-03-09 DIAGNOSIS — Z95828 Presence of other vascular implants and grafts: Secondary | ICD-10-CM

## 2019-03-09 LAB — CMP (CANCER CENTER ONLY)
ALT: 12 U/L (ref 0–44)
AST: 15 U/L (ref 15–41)
Albumin: 4.1 g/dL (ref 3.5–5.0)
Alkaline Phosphatase: 82 U/L (ref 38–126)
Anion gap: 8 (ref 5–15)
BUN: 13 mg/dL (ref 8–23)
CO2: 30 mmol/L (ref 22–32)
Calcium: 9.1 mg/dL (ref 8.9–10.3)
Chloride: 102 mmol/L (ref 98–111)
Creatinine: 1.18 mg/dL (ref 0.61–1.24)
GFR, Est AFR Am: >60 mL/min (ref >60)
GFR, Estimated: >60 mL/min (ref >60)
Glucose, Bld: 99 mg/dL (ref 70–99)
Potassium: 5.4 mmol/L — ABNORMAL HIGH (ref 3.5–5.1)
Sodium: 140 mmol/L (ref 135–145)
Total Bilirubin: 0.6 mg/dL (ref 0.3–1.2)
Total Protein: 6.8 g/dL (ref 6.5–8.1)

## 2019-03-09 LAB — CBC WITH DIFFERENTIAL (CANCER CENTER ONLY)
Abs Immature Granulocytes: 0.03 10*3/uL (ref 0.00–0.07)
Basophils Absolute: 0 10*3/uL (ref 0.0–0.1)
Basophils Relative: 0 %
Eosinophils Absolute: 0.2 10*3/uL (ref 0.0–0.5)
Eosinophils Relative: 3 %
HCT: 41 % (ref 39.0–52.0)
Hemoglobin: 12.7 g/dL — ABNORMAL LOW (ref 13.0–17.0)
Immature Granulocytes: 0 %
Lymphocytes Relative: 19 %
Lymphs Abs: 1.4 10*3/uL (ref 0.7–4.0)
MCH: 29.4 pg (ref 26.0–34.0)
MCHC: 31 g/dL (ref 30.0–36.0)
MCV: 94.9 fL (ref 80.0–100.0)
Monocytes Absolute: 0.5 10*3/uL (ref 0.1–1.0)
Monocytes Relative: 7 %
Neutro Abs: 5 10*3/uL (ref 1.7–7.7)
Neutrophils Relative %: 71 %
Platelet Count: 160 10*3/uL (ref 150–400)
RBC: 4.32 MIL/uL (ref 4.22–5.81)
RDW: 13.7 % (ref 11.5–15.5)
WBC Count: 7.2 10*3/uL (ref 4.0–10.5)
nRBC: 0 % (ref 0.0–0.2)

## 2019-03-09 LAB — LACTATE DEHYDROGENASE: LDH: 85 U/L — ABNORMAL LOW (ref 98–192)

## 2019-03-09 MED ORDER — SODIUM CHLORIDE 0.9% FLUSH
10.0000 mL | INTRAVENOUS | Status: DC | PRN
  Administered 2019-03-09: 10:00:00 10 mL via INTRAVENOUS
  Filled 2019-03-09: qty 10

## 2019-03-09 MED ORDER — HEPARIN SOD (PORK) LOCK FLUSH 100 UNIT/ML IV SOLN
500.0000 [IU] | Freq: Once | INTRAVENOUS | Status: AC | PRN
  Administered 2019-03-09: 500 [IU] via INTRAVENOUS
  Filled 2019-03-09: qty 5

## 2019-03-09 NOTE — Telephone Encounter (Signed)
Scheduled appt per 6/18 los. Printed calendar and avs.  Gave patient contrast and the number to central radiology.

## 2019-03-09 NOTE — Progress Notes (Signed)
[facility] [phone]   Fax:([phone]  OFFICE PROGRESS NOTE  Iva Lento, PA-C [address] [place] [address]  DIAGNOSIS: Stage III large B-cell non-Hodgkin lymphoma diagnosed in December 2016 and presented with extensive lymphadenopathy involving the neck, chest, abdomen as well as splenomegaly.   PRIOR THERAPY: Systemic chemotherapy with the CHOP/Rituxan every 3 weeks with Neulasta support. Status post 6 cycles.  CURRENT THERAPY: Observation.   INTERVAL HISTORY: [name] 68 y.o. male returns to the clinic today for 6 months follow-up visit.  The patient is feeling fine today with no concerning complaints except for postnasal drainage and mild cough.  He denied having any chest pain, shortness of breath or hemoptysis.  He denied having any recent weight loss or night sweats.  He has no nausea, vomiting, diarrhea or constipation.  He has no headache or visual changes.  He is here today for evaluation and repeat blood work.   MEDICAL HISTORY: Past Medical History:  Diagnosis Date  . BPH (benign prostatic hyperplasia) 2011  . Congestive heart failure ([place])   . COPD (chronic obstructive pulmonary disease) ([place])   . Depression   . Drug-induced neutropenia ([place]) 10/08/2015  . Encounter for antineoplastic chemotherapy 10/08/2015  . Gastrointestinal obstruction (HCC)    Ileus  . Gout   . Hypertension   . Hypothyroid   . Lung cancer ([place])   . NHL (non-Hodgkin's lymphoma) ([place]) 09/25/2015  . Obesity   . Polycythemia   . Pulmonary hypertension ([place])   . Sepsis ([place]) 01/14/2016  . Streptococcal pneumonia [facility]) August 2009    ALLERGIES:  has No Known Allergies.  MEDICATIONS:  Current Outpatient Medications  Medication Sig Dispense Refill  . albuterol (PROVENTIL) (2.5 MG/3ML) 0.083% nebulizer solution Take 3 mLs (2.5 mg total) by nebulization every 4 (four) hours as needed for wheezing or shortness of breath. 75 mL 3  .  allopurinol (ZYLOPRIM) 300 MG tablet Take 300 mg by mouth daily.    [name] amLODipine (NORVASC) 5 MG tablet Take 1 tablet by mouth Daily.    . betamethasone dipropionate 0.05 % lotion Apply 1 application topically 2 (two) times daily as needed (psorasis).     . budesonide-formoterol (SYMBICORT) 160-4.5 MCG/ACT inhaler Inhale 2 puffs into the lungs 2 (two) times daily. 1 Inhaler 5  . levothyroxine (SYNTHROID, LEVOTHROID) 150 MCG tablet Take 150 mcg by mouth daily.    [name] lisinopril (PRINIVIL,ZESTRIL) 10 MG tablet Take 1 tablet (10 mg total) by mouth daily. 30 tablet 11  . omeprazole (PRILOSEC) 20 MG capsule TAKE 1 CAPSULE BY MOUTH TWICE DAILY BEFORE A MEAL 180 capsule 1  . oxyCODONE-acetaminophen (PERCOCET) 10-325 MG tablet Take 1 tablet by mouth every 4 (four) hours as needed for pain.    [name] PROAIR HFA 108 (90 Base) MCG/ACT inhaler Inhale 2 puffs into the lungs every 6 (six) hours as needed for wheezing or shortness of breath. 3 Inhaler 1  . tiotropium (SPIRIVA) 18 MCG inhalation capsule Place 1 capsule (18 mcg total) daily into inhaler and inhale. 90 capsule 1   No current facility-administered medications for this visit.    Facility-Administered Medications Ordered in Other Visits  Medication Dose Route Frequency Provider Last Rate Last Dose  . sodium chloride flush (NS) 0.9 % injection 10 mL  10 mL Intravenous PRN Curt Bears, MD   10 mL at 03/09/19 1008    SURGICAL HISTORY:  Past Surgical History:  Procedure Laterality Date  . APPENDECTOMY    .  BACK SURGERY    . CARDIOVERSION  11/27/2011   Procedure: CARDIOVERSION;  Surgeon: Thayer Headings, MD;  Location: [employer];  Service: Cardiovascular;  Laterality: N/A;  . LUNG CANCER SURGERY  2011    REVIEW OF SYSTEMS:  A comprehensive review of systems was negative except for: Respiratory: positive for cough   PHYSICAL EXAMINATION: General appearance: alert, cooperative, appears stated age and no distress Head: Normocephalic, without obvious  abnormality, atraumatic Neck: no adenopathy, no JVD, supple, symmetrical, trachea midline and thyroid not enlarged, symmetric, no tenderness/mass/nodules Lymph nodes: Cervical, supraclavicular, and axillary nodes normal. Resp: clear to auscultation bilaterally Back: symmetric, no curvature. ROM normal. No CVA tenderness. Cardio: regular rate and rhythm, S1, S2 normal, no murmur, click, rub or gallop GI: soft, non-tender; bowel sounds normal; no masses,  no organomegaly Extremities: extremities normal, atraumatic, no cyanosis or edema  ECOG PERFORMANCE STATUS: 1 - Symptomatic but completely ambulatory  Blood pressure (!) 145/80, pulse 87, temperature 98.2 F (36.8 C), temperature source Oral, resp. rate 18, weight 233 lb 0.9 oz (105.7 kg), SpO2 98 %.  LABORATORY DATA: Lab Results  Component Value Date   WBC 7.2 03/09/2019   HGB 12.7 (L) 03/09/2019   HCT 41.0 03/09/2019   MCV 94.9 03/09/2019   PLT 160 03/09/2019      Chemistry      Component Value Date/Time   NA 142 09/06/2018 0825   NA 140 09/07/2017 1322   K 4.4 09/06/2018 0825   K 5.2 No visable hemolysis (H) 09/07/2017 1322   CL 104 09/06/2018 0825   CO2 30 09/06/2018 0825   CO2 33 (H) 09/07/2017 1322   BUN 11 09/06/2018 0825   BUN 13.6 09/07/2017 1322   CREATININE 0.89 09/06/2018 0825   CREATININE 1.0 09/07/2017 1322      Component Value Date/Time   CALCIUM 9.5 09/06/2018 0825   CALCIUM 9.1 09/07/2017 1322   ALKPHOS 92 09/06/2018 0825   ALKPHOS 92 09/07/2017 1322   AST 15 09/06/2018 0825   AST 11 09/07/2017 1322   ALT 14 09/06/2018 0825   ALT 8 09/07/2017 1322   BILITOT 0.7 09/06/2018 0825   BILITOT 0.47 09/07/2017 1322       RADIOGRAPHIC STUDIES: No results found.  ASSESSMENT AND PLAN:  This is a very pleasant 68 years old white male with stage IIIa large B-cell non-Hodgkin lymphoma status post 6 cycles of systemic chemotherapy with CHOP/Rituxan with almost complete response. The patient is currently on  observation and he is doing fine. His blood work still pending. I recommended for the patient to continue on observation with repeat blood work and CT scan of the chest, abdomen and pelvis in 6 months.  If there is any abnormality in the pending blood work, I will call the patient with the results and recommendation. He was advised to call immediately if he has any concerning symptoms in the interval. He will have Port-A-Cath flush every 2 months. The patient voices understanding of current disease status and treatment options and is in agreement with the current care plan. All questions were answered. The patient knows to call the clinic with any problems, questions or concerns. We can certainly see the patient much sooner if necessary. I spent 10 minutes counseling the patient face to face. The total time spent in the appointment was 15 minutes. Disclaimer: This note was dictated with voice recognition software. Similar sounding words can inadvertently be transcribed and may not be corrected upon review.

## 2019-03-09 NOTE — Patient Instructions (Signed)

## 2019-03-13 DIAGNOSIS — E7800 Pure hypercholesterolemia, unspecified: Secondary | ICD-10-CM | POA: Diagnosis not present

## 2019-03-13 DIAGNOSIS — F1120 Opioid dependence, uncomplicated: Secondary | ICD-10-CM | POA: Diagnosis not present

## 2019-03-13 DIAGNOSIS — M5416 Radiculopathy, lumbar region: Secondary | ICD-10-CM | POA: Diagnosis not present

## 2019-03-13 DIAGNOSIS — Z79899 Other long term (current) drug therapy: Secondary | ICD-10-CM | POA: Diagnosis not present

## 2019-03-13 DIAGNOSIS — Z79891 Long term (current) use of opiate analgesic: Secondary | ICD-10-CM | POA: Diagnosis not present

## 2019-03-13 DIAGNOSIS — R5383 Other fatigue: Secondary | ICD-10-CM | POA: Diagnosis not present

## 2019-03-13 DIAGNOSIS — G8929 Other chronic pain: Secondary | ICD-10-CM | POA: Diagnosis not present

## 2019-03-13 DIAGNOSIS — E559 Vitamin D deficiency, unspecified: Secondary | ICD-10-CM | POA: Diagnosis not present

## 2019-03-20 DIAGNOSIS — R0602 Shortness of breath: Secondary | ICD-10-CM | POA: Diagnosis not present

## 2019-03-20 DIAGNOSIS — I272 Pulmonary hypertension, unspecified: Secondary | ICD-10-CM | POA: Diagnosis not present

## 2019-03-20 DIAGNOSIS — R9431 Abnormal electrocardiogram [ECG] [EKG]: Secondary | ICD-10-CM | POA: Diagnosis not present

## 2019-03-26 DIAGNOSIS — J449 Chronic obstructive pulmonary disease, unspecified: Secondary | ICD-10-CM | POA: Diagnosis not present

## 2019-03-31 DIAGNOSIS — Z1159 Encounter for screening for other viral diseases: Secondary | ICD-10-CM | POA: Diagnosis not present

## 2019-03-31 DIAGNOSIS — J018 Other acute sinusitis: Secondary | ICD-10-CM | POA: Diagnosis not present

## 2019-03-31 DIAGNOSIS — R05 Cough: Secondary | ICD-10-CM | POA: Diagnosis not present

## 2019-03-31 DIAGNOSIS — R0602 Shortness of breath: Secondary | ICD-10-CM | POA: Diagnosis not present

## 2019-04-11 DIAGNOSIS — F1120 Opioid dependence, uncomplicated: Secondary | ICD-10-CM | POA: Diagnosis not present

## 2019-04-11 DIAGNOSIS — R0989 Other specified symptoms and signs involving the circulatory and respiratory systems: Secondary | ICD-10-CM | POA: Diagnosis not present

## 2019-04-11 DIAGNOSIS — Z1159 Encounter for screening for other viral diseases: Secondary | ICD-10-CM | POA: Diagnosis not present

## 2019-04-11 DIAGNOSIS — G8929 Other chronic pain: Secondary | ICD-10-CM | POA: Diagnosis not present

## 2019-04-11 DIAGNOSIS — M545 Low back pain: Secondary | ICD-10-CM | POA: Diagnosis not present

## 2019-04-11 DIAGNOSIS — M5416 Radiculopathy, lumbar region: Secondary | ICD-10-CM | POA: Diagnosis not present

## 2019-04-11 DIAGNOSIS — M79604 Pain in right leg: Secondary | ICD-10-CM | POA: Diagnosis not present

## 2019-04-11 DIAGNOSIS — M79605 Pain in left leg: Secondary | ICD-10-CM | POA: Diagnosis not present

## 2019-04-11 DIAGNOSIS — Z79899 Other long term (current) drug therapy: Secondary | ICD-10-CM | POA: Diagnosis not present

## 2019-04-26 DIAGNOSIS — J449 Chronic obstructive pulmonary disease, unspecified: Secondary | ICD-10-CM | POA: Diagnosis not present

## 2019-05-01 DIAGNOSIS — Z20828 Contact with and (suspected) exposure to other viral communicable diseases: Secondary | ICD-10-CM | POA: Diagnosis not present

## 2019-05-08 DIAGNOSIS — G8929 Other chronic pain: Secondary | ICD-10-CM | POA: Diagnosis not present

## 2019-05-08 DIAGNOSIS — J449 Chronic obstructive pulmonary disease, unspecified: Secondary | ICD-10-CM | POA: Diagnosis not present

## 2019-05-08 DIAGNOSIS — M545 Low back pain: Secondary | ICD-10-CM | POA: Diagnosis not present

## 2019-05-08 DIAGNOSIS — Z79891 Long term (current) use of opiate analgesic: Secondary | ICD-10-CM | POA: Diagnosis not present

## 2019-05-08 DIAGNOSIS — Z79899 Other long term (current) drug therapy: Secondary | ICD-10-CM | POA: Diagnosis not present

## 2019-05-08 DIAGNOSIS — Z1159 Encounter for screening for other viral diseases: Secondary | ICD-10-CM | POA: Diagnosis not present

## 2019-05-22 DIAGNOSIS — J189 Pneumonia, unspecified organism: Secondary | ICD-10-CM | POA: Diagnosis not present

## 2019-05-22 DIAGNOSIS — R05 Cough: Secondary | ICD-10-CM | POA: Diagnosis not present

## 2019-05-22 DIAGNOSIS — R0602 Shortness of breath: Secondary | ICD-10-CM | POA: Diagnosis not present

## 2019-05-22 DIAGNOSIS — R509 Fever, unspecified: Secondary | ICD-10-CM | POA: Diagnosis not present

## 2019-05-24 ENCOUNTER — Other Ambulatory Visit: Payer: Self-pay

## 2019-05-24 ENCOUNTER — Other Ambulatory Visit: Payer: Self-pay | Admitting: General Surgery

## 2019-05-24 ENCOUNTER — Other Ambulatory Visit: Payer: Self-pay | Admitting: Emergency Medicine

## 2019-05-24 ENCOUNTER — Encounter: Payer: Self-pay | Admitting: Internal Medicine

## 2019-05-24 ENCOUNTER — Ambulatory Visit: Payer: PPO | Admitting: Internal Medicine

## 2019-05-24 VITALS — BP 132/80 | HR 74 | Temp 98.1°F | Ht 72.0 in | Wt 230.0 lb

## 2019-05-24 DIAGNOSIS — Z20822 Contact with and (suspected) exposure to covid-19: Secondary | ICD-10-CM

## 2019-05-24 DIAGNOSIS — R042 Hemoptysis: Secondary | ICD-10-CM

## 2019-05-24 DIAGNOSIS — J441 Chronic obstructive pulmonary disease with (acute) exacerbation: Secondary | ICD-10-CM | POA: Diagnosis not present

## 2019-05-24 DIAGNOSIS — J449 Chronic obstructive pulmonary disease, unspecified: Secondary | ICD-10-CM

## 2019-05-24 DIAGNOSIS — R6889 Other general symptoms and signs: Secondary | ICD-10-CM

## 2019-05-24 MED ORDER — PREDNISONE 10 MG PO TABS: ORAL_TABLET | ORAL | 0 refills | Status: DC

## 2019-05-24 NOTE — Progress Notes (Addendum)
IOV 05/13/2016   Chief Complaint  Patient presents with  . Follow-up    Pt states he feels his SOB has improved since last OV. Pt c/o prod cough with white to light brown mucus. Pt denies CP/tightness and f/c/s.      Follow-up Gold stage III COPD. FEV1 1.66 L/45% post-dilator in March 2017 and DLCO 13.96/39%. His maintenance Spiriva and nocturnal oxygen and exertional oxygen  Last seen by nurse practitioner spring 2017. He is currently office chemotherapy for lymphoma. He is feeling improved. Improved energy levels. Shortness of breath. Mild exertional dyspnea class II at the most. Not much of a cough. He does notice desaturations after shower to 88% but does not feel it. He is wondering if he should use oxygen because even exertional oxygen using his portable system does not help him. Last imaging March 2017 that showed pleural plaques but decreasing adenopathy from his lymphoma. Walking desaturation test 185 feet 3 laps on room air: Resting pulse ox was 95%. tt 3rd lap lowest was 89% btu asymptomatic  OV 05/24/2019  Subjective:  Patient ID: [name], male , DOB: 10/26/1950 , age 68 y.o. , MRN: [id number] , ADDRESS: [address] [place] [facility] [id number]   05/24/2019 -   Chief Complaint  Patient presents with  . Cough    Productive since 05/22/19 with brown congestion.     Follow-up Gold stage III COPD. FEV1 1.66 L/45% post-dilator in March 2017 and DLCO 13.96/39%. His maintenance TRELEGY and nocturnal oxygen and +_/-  exertional oxygen   HPI [name] 68 y.o. -presents for follow-up.  I personally not seen him in the last 3 years.  He saw my nurse practitioner in February 2020.  Overall he is doing well.  In December 2019 he had a CT scan of the chest for lymphoma and it is in remission.  He has upcoming CT scan of the chest in December 2020.  He tells me for the last 2 days he has had abrupt fever associated with brown sputum and also some hemoptysis.  He has seen his  primary r care physician who did a chest x-ray and confirmed that he has a touch of pneumonia.  A steroid shot was given 10 days of Levaquin has been given.  He has been given DuoNeb nebulizer on top of his Trelegy.  He says he is beginning to feel better.  His hemoptysis persists but it is mild and is improving.  Overall he is getting better.  There is no more fever.  COPD CAT score is 18.  He says he was COVID tested and this was negative.  He says his been COVID tested at least 4 times since the onset of the pandemic and every time it is been negative.  The last test was 2 days ago her primary care physician's office based on his history.    Update: his office said they did only antibody test for covid and not antigen/PCR test   CAT COPD Symptom & Quality of Life Score ([place]) 0 is no burden. 5 is highest burden 05/24/2019   Never Cough -> Cough all the time 3  No phlegm in chest -> Chest is full of phlegm 3  No chest tightness -> Chest feels very tight 0  No dyspnea for 1 flight stairs/hill -> Very dyspneic for 1 flight of stairs 4  No limitations for ADL at home -> Very limited with ADL at home 1  Confident  leaving home -> Not at all confident leaving home 1  Sleep soundly -> Do not sleep soundly because of lung condition 4  Lots of Energy -> No energy at all 2  TOTAL Score (max 40)  18       ROS - per HPI     has a past medical history of BPH (benign prostatic hyperplasia) (2011), Congestive heart failure ([place]), COPD (chronic obstructive pulmonary disease) ([place]), Depression, Drug-induced neutropenia ([place]) (10/08/2015), Encounter for antineoplastic chemotherapy (10/08/2015), Gastrointestinal obstruction ([place]), Gout, Hypertension, Hypothyroid, Lung cancer ([place]), NHL (non-Hodgkin's lymphoma) ([place]) (09/25/2015), Obesity, Polycythemia, Pulmonary hypertension ([place]), Sepsis ([place]) (01/14/2016), and Streptococcal pneumonia [facility]) (August 2009).   reports that he quit smoking about 17  years ago. His smoking use included cigarettes. He has a 129.00 pack-year smoking history. He has never used smokeless tobacco.  Past Surgical History:  Procedure Laterality Date  . APPENDECTOMY    . BACK SURGERY    . CARDIOVERSION  11/27/2011   Procedure: CARDIOVERSION;  Surgeon: Thayer Headings, MD;  Location: [place];  Service: Cardiovascular;  Laterality: N/A;  . LUNG CANCER SURGERY  2011    No Known Allergies  Immunization History  Administered Date(s) Administered  . Influenza Whole 06/21/2009, 08/30/2011  . Influenza, High Dose Seasonal PF 06/10/2017  . Influenza-Unspecified 09/16/2015, 05/13/2016  . Pneumococcal Conjugate-13 08/06/2017  . Pneumococcal Polysaccharide-23 05/22/2008  . Tdap 12/03/2011    Family History  Problem Relation Age of Onset  . Heart disease Father   . Cancer Neg Hx   . Diabetes Neg Hx   . Stroke Neg Hx   . Hypertension Neg Hx   . Kidney disease Neg Hx      Current Outpatient Medications:  .  albuterol (PROVENTIL) (2.5 MG/3ML) 0.083% nebulizer solution, Take 3 mLs (2.5 mg total) by nebulization every 4 (four) hours as needed for wheezing or shortness of breath., Disp: 75 mL, Rfl: 3 .  allopurinol (ZYLOPRIM) 300 MG tablet, Take 300 mg by mouth daily., Disp: , Rfl:  .  amLODipine (NORVASC) 10 MG tablet, Take 10 mg by mouth daily., Disp: , Rfl:  .  betamethasone dipropionate 0.05 % lotion, Apply 1 application topically 2 (two) times daily as needed (psorasis). , Disp: , Rfl:  .  finasteride (PROSCAR) 5 MG tablet, Take 5 mg by mouth daily., Disp: , Rfl:  .  ipratropium-albuterol (DUONEB) 0.5-2.5 (3) MG/3ML SOLN, every 6 (six) hours as needed., Disp: , Rfl:  .  levofloxacin (LEVAQUIN) 500 MG tablet, Take 500 mg by mouth daily., Disp: , Rfl:  .  levothyroxine (SYNTHROID, LEVOTHROID) 150 MCG tablet, Take 150 mcg by mouth daily., Disp: , Rfl:  .  lisinopril (PRINIVIL,ZESTRIL) 10 MG tablet, Take 1 tablet (10 mg total) by mouth daily., Disp: 30  tablet, Rfl: 11 .  omeprazole (PRILOSEC) 20 MG capsule, TAKE 1 CAPSULE BY MOUTH TWICE DAILY BEFORE A MEAL, Disp: 180 capsule, Rfl: 1 .  oxyCODONE-acetaminophen (PERCOCET) 10-325 MG tablet, Take 1 tablet by mouth every 4 (four) hours as needed for pain., Disp: , Rfl:  .  PROAIR HFA 108 (90 Base) MCG/ACT inhaler, Inhale 2 puffs into the lungs every 6 (six) hours as needed for wheezing or shortness of breath., Disp: 3 Inhaler, Rfl: 1 .  TRELEGY ELLIPTA 100-62.5-25 MCG/INH AEPB, Inhale 1 puff into the lungs daily., Disp: , Rfl:       Objective:   Vitals:   05/24/19 0916  BP: 132/80  Pulse: 74  Temp: 98.1 F (36.7 C)  SpO2: 95%  Weight: 230 lb (104.3 kg)  Height: 6' (1.829 m)    Estimated body mass index is 31.19 kg/m as calculated from the following:   Height as of this encounter: 6' (1.829 m).   Weight as of this encounter: 230 lb (104.3 kg).  @WEIGHTCHANGE @  [employer]   05/24/19 0916  Weight: 230 lb (104.3 kg)     Physical Exam  General Appearance:    Alert, cooperative, no distress, appears stated age - yes , Deconditioned looking - no , OBESE  - no, Sitting on Wheelchair -  no  Head:    Normocephalic, without obvious abnormality, atraumatic  Eyes:    PERRL, conjunctiva/corneas clear,  Ears:    Normal TM's and external ear canals, both ears  Nose:   Nares normal, septum midline, mucosa normal, no drainage    or sinus tenderness. OXYGEN ON  - no . Patient is @ ra   Throat:   MASK  Neck:   Supple, symmetrical, trachea midline, no adenopathy;    thyroid:  no enlargement/tenderness/nodules; no carotid   bruit or JVD  Back:     Symmetric, no curvature, ROM normal, no CVA tenderness  Lungs:     Distress - no , Wheeze no, Barrell Chest - no, Purse lip breathing - no, Crackles - yes diffuse   Chest Wall:    No tenderness or deformity.    Heart:    Regular rate and rhythm, S1 and S2 normal, no rub   or gallop, Murmur - no  Breast Exam:    NOT DONE  Abdomen:     Soft,  non-tender, bowel sounds active all four quadrants,    no masses, no organomegaly. Visceral obesity - yes  Genitalia:   NOT DONE  Rectal:   NOT DONE  Extremities:   Extremities - normal, Has Cane - no, Clubbing - no, Edema - no  Pulses:   2+ and symmetric all extremities  Skin:   Stigmata of Connective Tissue Disease - no  Lymph nodes:   Cervical, supraclavicular, and axillary nodes normal  Psychiatric:  Neurologic:   Pleasant - yes, Anxious - no, Flat affect - no  CAm-ICU - neg, Alert and Oriented x 3 - yes, Moves all 4s - yes, Speech - normal, Cognition - intact           Assessment:       ICD-10-CM   1. COPD with acute exacerbation ([place])  J44.1   2. Suspected Covid-19 Virus Infection  R68.89   3. Hemoptysis  R04.2   4. COPD, severe ([place])  J44.9        Plan:     Patient Instructions     ICD-10-CM   1. COPD with acute exacerbation ([place])  J44.1   2. Suspected Covid-19 Virus Infection  R68.89   3. Hemoptysis  R04.2   4. COPD, severe ([place])  J44.9    You are having a flare up of copd with coughing up blood Antibody test for covid at PCP office not reliable for first 10-15 days of illness  Plan  - Do  Drive though [id number] testing 05/24/2019 - VERY IMPORTANT - complete levaquin by PCP  - start Please take prednisone 40 mg x1 day, then 30 mg x1 day, then 20 mg x1 day, then 10 mg x1 day, and then 5 mg x1 day and stop - continue trelegy  - continue night o2  - use albuterol or duoneb as needed - no flu  shot 05/24/2019   Followup  - Jan 2021 but come sooner if coughing of blood remains a week from now  > 50% of this > 25 min visit spent in face to face counseling or coordination of care - by this undersigned MD - Dr Brand Males. This includes one or more of the following documented above: discussion of test results, diagnostic or treatment recommendations, prognosis, risks and benefits of management options, instructions, education, compliance or risk-factor reduction     SIGNATURE    Dr. Brand Males, M.D., F.C.C.P,  Pulmonary and Critical Care Medicine Staff Physician, [place] Director - Interstitial Lung Disease  Program  Pulmonary [place] [facility] [place], [place], [address]  Pager: [phone], If no answer or between  15:00h - 7:00h: call [phone] Telephone: [phone]  9:51 AM 05/24/2019

## 2019-05-24 NOTE — Patient Instructions (Addendum)
ICD-10-CM   1. COPD with acute exacerbation ([place])  J44.1   2. Suspected Covid-19 Virus Infection  R68.89   3. Hemoptysis  R04.2   4. COPD, severe ([place])  J44.9    You are having a flare up of copd with coughing up blood Antibody test for covid at PCP office not reliable for first 10-15 days of illness  Plan  - Do  Drive though [id number] testing 05/24/2019 - VERY IMPORTANT - complete levaquin by PCP  - start Please take prednisone 40 mg x1 day, then 30 mg x1 day, then 20 mg x1 day, then 10 mg x1 day, and then 5 mg x1 day and stop - continue trelegy  - continue night o2  - use albuterol or duoneb as needed - no flu shot 05/24/2019   Followup  - Jan 2021 but come sooner if coughing of blood remains a week from now

## 2019-05-25 ENCOUNTER — Telehealth: Payer: Self-pay | Admitting: Internal Medicine

## 2019-05-25 LAB — NOVEL CORONAVIRUS, NAA: SARS-CoV-2, NAA: NOT DETECTED

## 2019-05-25 NOTE — Telephone Encounter (Signed)
Returned patient's phone call regarding scheduling a port flush, patient will call back once he is ready to schedule.

## 2019-05-27 DIAGNOSIS — J449 Chronic obstructive pulmonary disease, unspecified: Secondary | ICD-10-CM | POA: Diagnosis not present

## 2019-06-02 ENCOUNTER — Telehealth: Payer: Self-pay | Admitting: Internal Medicine

## 2019-06-02 NOTE — Telephone Encounter (Signed)
Returned patient's phone call regarding scheduling a port flush per treatment plan, per patient's request appointment has been added on 09/15.

## 2019-06-05 ENCOUNTER — Telehealth: Payer: Self-pay | Admitting: Internal Medicine

## 2019-06-05 NOTE — Telephone Encounter (Signed)
Called patient regarding pre-screening questions for 09/15 appointments.

## 2019-06-06 ENCOUNTER — Other Ambulatory Visit: Payer: Self-pay

## 2019-06-06 ENCOUNTER — Inpatient Hospital Stay: Payer: PPO | Attending: Internal Medicine

## 2019-06-06 DIAGNOSIS — C8332 Diffuse large B-cell lymphoma, intrathoracic lymph nodes: Secondary | ICD-10-CM | POA: Diagnosis not present

## 2019-06-06 DIAGNOSIS — Z95828 Presence of other vascular implants and grafts: Secondary | ICD-10-CM

## 2019-06-06 DIAGNOSIS — Z452 Encounter for adjustment and management of vascular access device: Secondary | ICD-10-CM | POA: Diagnosis not present

## 2019-06-06 MED ORDER — SODIUM CHLORIDE 0.9% FLUSH
10.0000 mL | INTRAVENOUS | Status: DC | PRN
  Administered 2019-06-06: 10 mL via INTRAVENOUS
  Filled 2019-06-06: qty 10

## 2019-06-06 MED ORDER — HEPARIN SOD (PORK) LOCK FLUSH 100 UNIT/ML IV SOLN
500.0000 [IU] | Freq: Once | INTRAVENOUS | Status: AC | PRN
  Administered 2019-06-06: 500 [IU] via INTRAVENOUS
  Filled 2019-06-06: qty 5

## 2019-06-07 DIAGNOSIS — J449 Chronic obstructive pulmonary disease, unspecified: Secondary | ICD-10-CM | POA: Diagnosis not present

## 2019-06-07 DIAGNOSIS — G8929 Other chronic pain: Secondary | ICD-10-CM | POA: Diagnosis not present

## 2019-06-07 DIAGNOSIS — Z1159 Encounter for screening for other viral diseases: Secondary | ICD-10-CM | POA: Diagnosis not present

## 2019-06-07 DIAGNOSIS — M5416 Radiculopathy, lumbar region: Secondary | ICD-10-CM | POA: Diagnosis not present

## 2019-06-07 DIAGNOSIS — M109 Gout, unspecified: Secondary | ICD-10-CM | POA: Diagnosis not present

## 2019-06-07 DIAGNOSIS — Z79899 Other long term (current) drug therapy: Secondary | ICD-10-CM | POA: Diagnosis not present

## 2019-06-26 DIAGNOSIS — J449 Chronic obstructive pulmonary disease, unspecified: Secondary | ICD-10-CM | POA: Diagnosis not present

## 2019-07-04 DIAGNOSIS — M5416 Radiculopathy, lumbar region: Secondary | ICD-10-CM | POA: Diagnosis not present

## 2019-07-04 DIAGNOSIS — F1120 Opioid dependence, uncomplicated: Secondary | ICD-10-CM | POA: Diagnosis not present

## 2019-07-04 DIAGNOSIS — R5383 Other fatigue: Secondary | ICD-10-CM | POA: Diagnosis not present

## 2019-07-04 DIAGNOSIS — M129 Arthropathy, unspecified: Secondary | ICD-10-CM | POA: Diagnosis not present

## 2019-07-04 DIAGNOSIS — Z79891 Long term (current) use of opiate analgesic: Secondary | ICD-10-CM | POA: Diagnosis not present

## 2019-07-04 DIAGNOSIS — Z7189 Other specified counseling: Secondary | ICD-10-CM | POA: Diagnosis not present

## 2019-07-04 DIAGNOSIS — Z79899 Other long term (current) drug therapy: Secondary | ICD-10-CM | POA: Diagnosis not present

## 2019-07-04 DIAGNOSIS — Z23 Encounter for immunization: Secondary | ICD-10-CM | POA: Diagnosis not present

## 2019-07-04 DIAGNOSIS — E611 Iron deficiency: Secondary | ICD-10-CM | POA: Diagnosis not present

## 2019-07-04 DIAGNOSIS — E7800 Pure hypercholesterolemia, unspecified: Secondary | ICD-10-CM | POA: Diagnosis not present

## 2019-07-04 DIAGNOSIS — G8929 Other chronic pain: Secondary | ICD-10-CM | POA: Diagnosis not present

## 2019-07-04 DIAGNOSIS — E559 Vitamin D deficiency, unspecified: Secondary | ICD-10-CM | POA: Diagnosis not present

## 2019-07-27 DIAGNOSIS — J449 Chronic obstructive pulmonary disease, unspecified: Secondary | ICD-10-CM | POA: Diagnosis not present

## 2019-08-09 DIAGNOSIS — M5416 Radiculopathy, lumbar region: Secondary | ICD-10-CM | POA: Diagnosis not present

## 2019-08-09 DIAGNOSIS — Z79899 Other long term (current) drug therapy: Secondary | ICD-10-CM | POA: Diagnosis not present

## 2019-08-09 DIAGNOSIS — R7303 Prediabetes: Secondary | ICD-10-CM | POA: Diagnosis not present

## 2019-08-09 DIAGNOSIS — Z1159 Encounter for screening for other viral diseases: Secondary | ICD-10-CM | POA: Diagnosis not present

## 2019-08-09 DIAGNOSIS — G8929 Other chronic pain: Secondary | ICD-10-CM | POA: Diagnosis not present

## 2019-08-26 DIAGNOSIS — J449 Chronic obstructive pulmonary disease, unspecified: Secondary | ICD-10-CM | POA: Diagnosis not present

## 2019-09-07 ENCOUNTER — Other Ambulatory Visit: Payer: Self-pay

## 2019-09-07 ENCOUNTER — Inpatient Hospital Stay: Payer: PPO | Attending: Internal Medicine | Admitting: Medical

## 2019-09-07 ENCOUNTER — Inpatient Hospital Stay: Payer: PPO

## 2019-09-07 DIAGNOSIS — Z79899 Other long term (current) drug therapy: Secondary | ICD-10-CM | POA: Diagnosis not present

## 2019-09-07 DIAGNOSIS — I509 Heart failure, unspecified: Secondary | ICD-10-CM | POA: Diagnosis not present

## 2019-09-07 DIAGNOSIS — C8332 Diffuse large B-cell lymphoma, intrathoracic lymph nodes: Secondary | ICD-10-CM | POA: Diagnosis not present

## 2019-09-07 DIAGNOSIS — R05 Cough: Secondary | ICD-10-CM | POA: Diagnosis not present

## 2019-09-07 DIAGNOSIS — Z9221 Personal history of antineoplastic chemotherapy: Secondary | ICD-10-CM | POA: Insufficient documentation

## 2019-09-07 DIAGNOSIS — R0602 Shortness of breath: Secondary | ICD-10-CM | POA: Diagnosis not present

## 2019-09-07 DIAGNOSIS — M109 Gout, unspecified: Secondary | ICD-10-CM | POA: Diagnosis not present

## 2019-09-07 DIAGNOSIS — E669 Obesity, unspecified: Secondary | ICD-10-CM | POA: Insufficient documentation

## 2019-09-07 DIAGNOSIS — I272 Pulmonary hypertension, unspecified: Secondary | ICD-10-CM | POA: Diagnosis not present

## 2019-09-07 DIAGNOSIS — N400 Enlarged prostate without lower urinary tract symptoms: Secondary | ICD-10-CM | POA: Insufficient documentation

## 2019-09-07 DIAGNOSIS — M5416 Radiculopathy, lumbar region: Secondary | ICD-10-CM | POA: Diagnosis not present

## 2019-09-07 DIAGNOSIS — F329 Major depressive disorder, single episode, unspecified: Secondary | ICD-10-CM | POA: Insufficient documentation

## 2019-09-07 DIAGNOSIS — J449 Chronic obstructive pulmonary disease, unspecified: Secondary | ICD-10-CM | POA: Insufficient documentation

## 2019-09-07 DIAGNOSIS — I10 Essential (primary) hypertension: Secondary | ICD-10-CM | POA: Diagnosis not present

## 2019-09-07 DIAGNOSIS — G8929 Other chronic pain: Secondary | ICD-10-CM | POA: Diagnosis not present

## 2019-09-07 DIAGNOSIS — E039 Hypothyroidism, unspecified: Secondary | ICD-10-CM | POA: Diagnosis not present

## 2019-09-07 DIAGNOSIS — J018 Other acute sinusitis: Secondary | ICD-10-CM | POA: Diagnosis not present

## 2019-09-07 DIAGNOSIS — Z95828 Presence of other vascular implants and grafts: Secondary | ICD-10-CM

## 2019-09-07 DIAGNOSIS — Z1159 Encounter for screening for other viral diseases: Secondary | ICD-10-CM | POA: Diagnosis not present

## 2019-09-07 DIAGNOSIS — D701 Agranulocytosis secondary to cancer chemotherapy: Secondary | ICD-10-CM | POA: Insufficient documentation

## 2019-09-07 LAB — CBC WITH DIFFERENTIAL (CANCER CENTER ONLY)
Abs Immature Granulocytes: 0.04 10*3/uL (ref 0.00–0.07)
Basophils Absolute: 0 10*3/uL (ref 0.0–0.1)
Basophils Relative: 0 %
Eosinophils Absolute: 0.1 10*3/uL (ref 0.0–0.5)
Eosinophils Relative: 2 %
HCT: 38.6 % — ABNORMAL LOW (ref 39.0–52.0)
Hemoglobin: 12.2 g/dL — ABNORMAL LOW (ref 13.0–17.0)
Immature Granulocytes: 1 %
Lymphocytes Relative: 15 %
Lymphs Abs: 1.1 10*3/uL (ref 0.7–4.0)
MCH: 29.1 pg (ref 26.0–34.0)
MCHC: 31.6 g/dL (ref 30.0–36.0)
MCV: 92.1 fL (ref 80.0–100.0)
Monocytes Absolute: 0.4 10*3/uL (ref 0.1–1.0)
Monocytes Relative: 6 %
Neutro Abs: 5.3 10*3/uL (ref 1.7–7.7)
Neutrophils Relative %: 76 %
Platelet Count: 186 10*3/uL (ref 150–400)
RBC: 4.19 MIL/uL — ABNORMAL LOW (ref 4.22–5.81)
RDW: 13.6 % (ref 11.5–15.5)
WBC Count: 6.9 10*3/uL (ref 4.0–10.5)
nRBC: 0 % (ref 0.0–0.2)

## 2019-09-07 LAB — CMP (CANCER CENTER ONLY)
ALT: 8 U/L (ref 0–44)
AST: 10 U/L — ABNORMAL LOW (ref 15–41)
Albumin: 3.7 g/dL (ref 3.5–5.0)
Alkaline Phosphatase: 83 U/L (ref 38–126)
Anion gap: 9 (ref 5–15)
BUN: 13 mg/dL (ref 8–23)
CO2: 28 mmol/L (ref 22–32)
Calcium: 8.8 mg/dL — ABNORMAL LOW (ref 8.9–10.3)
Chloride: 105 mmol/L (ref 98–111)
Creatinine: 1.08 mg/dL (ref 0.61–1.24)
GFR, Est AFR Am: >60 mL/min (ref >60)
GFR, Estimated: >60 mL/min (ref >60)
Glucose, Bld: 104 mg/dL — ABNORMAL HIGH (ref 70–99)
Potassium: 4.7 mmol/L (ref 3.5–5.1)
Sodium: 142 mmol/L (ref 135–145)
Total Bilirubin: 0.5 mg/dL (ref 0.3–1.2)
Total Protein: 6.2 g/dL — ABNORMAL LOW (ref 6.5–8.1)

## 2019-09-07 LAB — LACTATE DEHYDROGENASE: LDH: 107 U/L (ref 98–192)

## 2019-09-07 MED ORDER — HEPARIN SOD (PORK) LOCK FLUSH 100 UNIT/ML IV SOLN
500.0000 [IU] | Freq: Once | INTRAVENOUS | Status: AC | PRN
  Administered 2019-09-07: 500 [IU] via INTRAVENOUS
  Filled 2019-09-07: qty 5

## 2019-09-07 MED ORDER — SODIUM CHLORIDE 0.9% FLUSH
10.0000 mL | INTRAVENOUS | Status: DC | PRN
  Administered 2019-09-07: 10 mL via INTRAVENOUS
  Filled 2019-09-07: qty 10

## 2019-09-07 NOTE — Progress Notes (Signed)
These preliminary result these preliminary results were noted.  Awaiting final report.

## 2019-09-08 ENCOUNTER — Inpatient Hospital Stay: Payer: PPO

## 2019-09-08 ENCOUNTER — Ambulatory Visit (HOSPITAL_COMMUNITY)
Admission: RE | Admit: 2019-09-08 | Discharge: 2019-09-08 | Disposition: A | Discharge status: Home / Self Care | Payer: PPO | Source: Ambulatory Visit | Attending: Internal Medicine | Admitting: Internal Medicine

## 2019-09-08 ENCOUNTER — Encounter (HOSPITAL_COMMUNITY): Payer: Self-pay

## 2019-09-08 DIAGNOSIS — J439 Emphysema, unspecified: Secondary | ICD-10-CM | POA: Insufficient documentation

## 2019-09-08 DIAGNOSIS — J432 Centrilobular emphysema: Secondary | ICD-10-CM | POA: Diagnosis not present

## 2019-09-08 DIAGNOSIS — I700 Atherosclerosis of aorta: Secondary | ICD-10-CM | POA: Insufficient documentation

## 2019-09-08 DIAGNOSIS — R911 Solitary pulmonary nodule: Secondary | ICD-10-CM | POA: Diagnosis not present

## 2019-09-08 DIAGNOSIS — N200 Calculus of kidney: Secondary | ICD-10-CM | POA: Diagnosis not present

## 2019-09-08 DIAGNOSIS — J948 Other specified pleural conditions: Secondary | ICD-10-CM | POA: Insufficient documentation

## 2019-09-08 DIAGNOSIS — I2510 Atherosclerotic heart disease of native coronary artery without angina pectoris: Secondary | ICD-10-CM | POA: Diagnosis not present

## 2019-09-08 DIAGNOSIS — C8332 Diffuse large B-cell lymphoma, intrathoracic lymph nodes: Secondary | ICD-10-CM | POA: Insufficient documentation

## 2019-09-08 DIAGNOSIS — N329 Bladder disorder, unspecified: Secondary | ICD-10-CM | POA: Diagnosis not present

## 2019-09-08 DIAGNOSIS — R918 Other nonspecific abnormal finding of lung field: Secondary | ICD-10-CM | POA: Diagnosis not present

## 2019-09-08 DIAGNOSIS — N281 Cyst of kidney, acquired: Secondary | ICD-10-CM | POA: Diagnosis not present

## 2019-09-08 MED ORDER — SODIUM CHLORIDE (PF) 0.9 % IJ SOLN
INTRAMUSCULAR | Status: AC
  Filled 2019-09-08: qty 50

## 2019-09-08 MED ORDER — IOHEXOL 300 MG/ML  SOLN
100.0000 mL | Freq: Once | INTRAMUSCULAR | Status: AC | PRN
  Administered 2019-09-08: 100 mL via INTRAVENOUS

## 2019-09-11 ENCOUNTER — Encounter: Payer: Self-pay | Admitting: Internal Medicine

## 2019-09-11 ENCOUNTER — Other Ambulatory Visit: Payer: Self-pay

## 2019-09-11 ENCOUNTER — Inpatient Hospital Stay (HOSPITAL_BASED_OUTPATIENT_CLINIC_OR_DEPARTMENT_OTHER): Payer: PPO | Admitting: Internal Medicine

## 2019-09-11 VITALS — BP 140/81 | HR 93 | Temp 98.3°F | Resp 18 | Ht 72.0 in | Wt 238.9 lb

## 2019-09-11 DIAGNOSIS — C8332 Diffuse large B-cell lymphoma, intrathoracic lymph nodes: Secondary | ICD-10-CM | POA: Diagnosis not present

## 2019-09-11 DIAGNOSIS — R911 Solitary pulmonary nodule: Secondary | ICD-10-CM | POA: Diagnosis not present

## 2019-09-11 DIAGNOSIS — C3490 Malignant neoplasm of unspecified part of unspecified bronchus or lung: Secondary | ICD-10-CM | POA: Diagnosis not present

## 2019-09-11 DIAGNOSIS — C8258 Diffuse follicle center lymphoma, lymph nodes of multiple sites: Secondary | ICD-10-CM

## 2019-09-11 NOTE — Progress Notes (Signed)
[facility] [phone]   [phone]  OFFICE PROGRESS NOTE  [name], [place] [address] [place] [address]  DIAGNOSIS: Stage III large B-cell non-Hodgkin lymphoma diagnosed in December 2016 and presented with extensive lymphadenopathy involving the neck, chest, abdomen as well as splenomegaly.   PRIOR THERAPY: Systemic chemotherapy with the CHOP/Rituxan every 3 weeks with Neulasta support. Status post 6 cycles.  CURRENT THERAPY: Observation.   INTERVAL HISTORY: [name] 68 y.o. male returns to the clinic today for follow-up visit.  The patient is feeling fine today with no concerning complaints except for shortness of breath with exertion with mild cough but no significant chest pain or hemoptysis.  He denied having any fever or chills.  He has no nausea, vomiting, diarrhea or constipation.  He has no headache or visual changes.  He has no recent weight loss or night sweats.  The patient had repeat CT scan of the chest, abdomen pelvis performed recently and is here for evaluation and discussion of his discuss results.  MEDICAL HISTORY: Past Medical History:  Diagnosis Date  . BPH (benign prostatic hyperplasia) 2011  . Congestive heart failure ([facility])   . COPD (chronic obstructive pulmonary disease) ([facility])   . Depression   . Drug-induced neutropenia ([facility]) 10/08/2015  . Encounter for antineoplastic chemotherapy 10/08/2015  . Gastrointestinal obstruction ([facility])    Ileus  . Gout   . Hypertension   . Hypothyroid   . Lung cancer ([facility])   . NHL (non-Hodgkin's lymphoma) ([facility]) 09/25/2015  . Obesity   . Polycythemia   . Pulmonary hypertension ([facility])   . Sepsis ([place]) 01/14/2016  . Streptococcal pneumonia [facility]) August 2009    ALLERGIES:  has No Known Allergies.  MEDICATIONS:  Current Outpatient Medications  Medication Sig Dispense Refill  . albuterol (PROVENTIL) (2.5 MG/3ML) 0.083% nebulizer solution Take 3 mLs (2.5 mg total) by  nebulization every 4 (four) hours as needed for wheezing or shortness of breath. 75 mL 3  . allopurinol (ZYLOPRIM) 300 MG tablet Take 300 mg by mouth daily.    [name] amLODipine (NORVASC) 10 MG tablet Take 10 mg by mouth daily.    . betamethasone dipropionate 0.05 % lotion Apply 1 application topically 2 (two) times daily as needed (psorasis).     . finasteride (PROSCAR) 5 MG tablet Take 5 mg by mouth daily.    [name] ipratropium-albuterol (DUONEB) 0.5-2.5 (3) MG/3ML SOLN every 6 (six) hours as needed.    [name] levofloxacin (LEVAQUIN) 500 MG tablet Take 500 mg by mouth daily.    [name] levothyroxine (SYNTHROID, LEVOTHROID) 150 MCG tablet Take 150 mcg by mouth daily.    [name] lisinopril (PRINIVIL,ZESTRIL) 10 MG tablet Take 1 tablet (10 mg total) by mouth daily. 30 tablet 11  . omeprazole (PRILOSEC) 20 MG capsule TAKE 1 CAPSULE BY MOUTH TWICE DAILY BEFORE A MEAL 180 capsule 1  . oxyCODONE-acetaminophen (PERCOCET) 10-325 MG tablet Take 1 tablet by mouth every 4 (four) hours as needed for pain.    . predniSONE (DELTASONE) 10 MG tablet Take 4 tablets for one day, 3 tablets for one day, 2 tablets for one day, 1 tablet for one day, then 1/2 tablet (0.5 mg) for one day then stop. 11 tablet 0  . PROAIR HFA 108 (90 Base) MCG/ACT inhaler Inhale 2 puffs into the lungs every 6 (six) hours as needed for wheezing or shortness of breath. 3 Inhaler 1  . TRELEGY ELLIPTA 100-62.5-25 MCG/INH AEPB Inhale  1 puff into the lungs daily.     No current facility-administered medications for this visit.    SURGICAL HISTORY:  Past Surgical History:  Procedure Laterality Date  . APPENDECTOMY    . BACK SURGERY    . CARDIOVERSION  11/27/2011   Procedure: CARDIOVERSION;  Surgeon: Thayer Headings, MD;  Location: [facility];  Service: Cardiovascular;  Laterality: N/A;  . LUNG CANCER SURGERY  2011    REVIEW OF SYSTEMS:  Constitutional: negative Eyes: negative Ears, nose, mouth, throat, and face: negative Respiratory: positive for cough and  dyspnea on exertion Cardiovascular: negative Gastrointestinal: negative Genitourinary:negative Integument/breast: negative Hematologic/lymphatic: negative Musculoskeletal:negative Neurological: negative Behavioral/Psych: negative Endocrine: negative Allergic/Immunologic: negative   PHYSICAL EXAMINATION: General appearance: alert, cooperative, appears stated age and no distress Head: Normocephalic, without obvious abnormality, atraumatic Neck: no adenopathy, no JVD, supple, symmetrical, trachea midline and thyroid not enlarged, symmetric, no tenderness/mass/nodules Lymph nodes: Cervical, supraclavicular, and axillary nodes normal. Resp: clear to auscultation bilaterally Back: symmetric, no curvature. ROM normal. No CVA tenderness. Cardio: regular rate and rhythm, S1, S2 normal, no murmur, click, rub or gallop GI: soft, non-tender; bowel sounds normal; no masses,  no organomegaly Extremities: extremities normal, atraumatic, no cyanosis or edema Neurologic: Alert and oriented X 3, normal strength and tone. Normal symmetric reflexes. Normal coordination and gait  ECOG PERFORMANCE STATUS: 1 - Symptomatic but completely ambulatory  Blood pressure 140/81, pulse 93, temperature 98.3 F (36.8 C), temperature source Temporal, resp. rate 18, height 6' (1.829 m), weight 238 lb 14.4 oz (108.4 kg), SpO2 98 %.  LABORATORY DATA: Lab Results  Component Value Date   WBC 6.9 09/07/2019   HGB 12.2 (L) 09/07/2019   HCT 38.6 (L) 09/07/2019   MCV 92.1 09/07/2019   PLT 186 09/07/2019      Chemistry      Component Value Date/Time   NA 142 09/07/2019 0812   NA 140 09/07/2017 1322   K 4.7 09/07/2019 0812   K 5.2 No visable hemolysis (H) 09/07/2017 1322   CL 105 09/07/2019 0812   CO2 28 09/07/2019 0812   CO2 33 (H) 09/07/2017 1322   BUN 13 09/07/2019 0812   BUN 13.6 09/07/2017 1322   CREATININE 1.08 09/07/2019 0812   CREATININE 1.0 09/07/2017 1322      Component Value Date/Time   CALCIUM  8.8 (L) 09/07/2019 0812   CALCIUM 9.1 09/07/2017 1322   ALKPHOS 83 09/07/2019 0812   ALKPHOS 92 09/07/2017 1322   AST 10 (L) 09/07/2019 0812   AST 11 09/07/2017 1322   ALT 8 09/07/2019 0812   ALT 8 09/07/2017 1322   BILITOT 0.5 09/07/2019 0812   BILITOT 0.47 09/07/2017 1322       RADIOGRAPHIC STUDIES: CT Chest W Contrast  Result Date: 09/08/2019 CLINICAL DATA:  Non-Hodgkin's lymphoma diagnosed in 2016 with chemotherapy completed in 2017. Lung cancer in 2011 with partial left lung resection. Asymptomatic. EXAM: CT CHEST, ABDOMEN, AND PELVIS WITH CONTRAST TECHNIQUE: Multidetector CT imaging of the chest, abdomen and pelvis was performed following the standard protocol during bolus administration of intravenous contrast. CONTRAST:  182mL OMNIPAQUE IOHEXOL 300 MG/ML  SOLN COMPARISON:  09/06/2018 FINDINGS: CT CHEST FINDINGS Cardiovascular: Right Port-A-Cath tip at low SVC. Mild cardiomegaly, without pericardial effusion. Lad coronary artery atherosclerosis. No central pulmonary embolism, on this non-dedicated study. Pulmonary artery enlargement, outflow tract 3.4 cm. Mediastinum/Nodes: No supraclavicular adenopathy. A node within the azygoesophageal recess measures 1.4 cm on 38/2 versus 1.3 cm on the prior. Borderline size right paratracheal  node of 1.0 cm on 27/2, 7 mm previously. Lungs/Pleura: Right-sided pleural calcifications. Lower lobe predominant bronchial wall thickening. Presumed secretions within the right upper lobe bronchus and bronchus intermedius. Mild to moderate centrilobular emphysema. Development of a central right upper lobe pulmonary nodule of 2.4 x 1.8 cm on 66/4. A right lower lobe 4 mm nodule on 132/4 is new. Peribronchovascular interstitial thickening at both lung bases. Similar on the left and felt to be slightly progressive on the right. Presumed scarring within the posterolateral right upper lobe. Surgical sutures in the left upper lobe. 3 mm left lower lobe pulmonary nodule  on 114/4, similar. Musculoskeletal: No acute osseous abnormality. CT ABDOMEN PELVIS FINDINGS Hepatobiliary: Normal liver. Normal gallbladder, without biliary ductal dilatation. Pancreas: Normal, without mass or ductal dilatation. Spleen: No splenomegaly. inferior splenic 3.3 cm minimally complex cystic lesion is decreased in size from 3.6 cm previously. Adrenals/Urinary Tract: Normal adrenal glands. Multiple bilateral renal collecting system calculi. Bilateral renal cysts, including a dominant lower pole 8.4 cm lesion. Mild anterior bladder wall thickening with subtle pericystic interstitial thickening. Example 122 and 123/2. Stomach/Bowel: Normal stomach, without wall thickening. Colonic stool burden suggests constipation. Normal terminal ileum. Normal small bowel. Vascular/Lymphatic: Advanced aortic and branch vessel atherosclerosis. Multiple small abdominal retroperitoneal nodes. None pathologic by size criteria. index aortocaval node measures 8 mm on 77/2 versus 6 mm on the prior. No pelvic sidewall adenopathy. Soft tissue density adjacent to the bladder measures 9 mm on 124/2 versus 7 mm on the prior. Especially when compared back to 08/27/2016, favored to represent a varix. Reproductive: Normal prostate. Other: No significant free fluid. No evidence of omental or peritoneal disease. Musculoskeletal: Left iliac sclerotic lesion is similar and likely a bone island. Lumbosacral spondylosis. IMPRESSION: 1. New central right upper lobe pulmonary nodule with differential considerations of metachronous primary bronchogenic carcinoma or recurrent lymphoma. 2. Slight increase in size of nodes within the chest and abdomen, for which recurrent lymphoma cannot be excluded. Consider PET to direct tissue sampling. 3. Aortic atherosclerosis (ICD10-I70.0), coronary artery atherosclerosis and emphysema (ICD10-J43.9). 4. Pulmonary artery enlargement suggests pulmonary arterial hypertension. 5. Peribronchovascular interstitial  thickening is similar at the left lung base and increased at the right lung base. Considerations include chronic atypical infection or aspiration. 6. Bilateral nephrolithiasis. 7. Mild bladder wall thickening and pericystic edema. Correlate with any symptoms of cystitis or bladder outlet obstruction. 8. Right pleural calcifications, suggesting prior hemothorax or empyema. Electronically Signed   By: Abigail Miyamoto M.D.   On: 09/08/2019 08:40   CT Abdomen Pelvis W Contrast  Result Date: 09/08/2019 CLINICAL DATA:  Non-Hodgkin's lymphoma diagnosed in 2016 with chemotherapy completed in 2017. Lung cancer in 2011 with partial left lung resection. Asymptomatic. EXAM: CT CHEST, ABDOMEN, AND PELVIS WITH CONTRAST TECHNIQUE: Multidetector CT imaging of the chest, abdomen and pelvis was performed following the standard protocol during bolus administration of intravenous contrast. CONTRAST:  151mL OMNIPAQUE IOHEXOL 300 MG/ML  SOLN COMPARISON:  09/06/2018 FINDINGS: CT CHEST FINDINGS Cardiovascular: Right Port-A-Cath tip at low SVC. Mild cardiomegaly, without pericardial effusion. Lad coronary artery atherosclerosis. No central pulmonary embolism, on this non-dedicated study. Pulmonary artery enlargement, outflow tract 3.4 cm. Mediastinum/Nodes: No supraclavicular adenopathy. A node within the azygoesophageal recess measures 1.4 cm on 38/2 versus 1.3 cm on the prior. Borderline size right paratracheal node of 1.0 cm on 27/2, 7 mm previously. Lungs/Pleura: Right-sided pleural calcifications. Lower lobe predominant bronchial wall thickening. Presumed secretions within the right upper lobe bronchus and bronchus intermedius. Mild to moderate  centrilobular emphysema. Development of a central right upper lobe pulmonary nodule of 2.4 x 1.8 cm on 66/4. A right lower lobe 4 mm nodule on 132/4 is new. Peribronchovascular interstitial thickening at both lung bases. Similar on the left and felt to be slightly progressive on the right.  Presumed scarring within the posterolateral right upper lobe. Surgical sutures in the left upper lobe. 3 mm left lower lobe pulmonary nodule on 114/4, similar. Musculoskeletal: No acute osseous abnormality. CT ABDOMEN PELVIS FINDINGS Hepatobiliary: Normal liver. Normal gallbladder, without biliary ductal dilatation. Pancreas: Normal, without mass or ductal dilatation. Spleen: No splenomegaly. inferior splenic 3.3 cm minimally complex cystic lesion is decreased in size from 3.6 cm previously. Adrenals/Urinary Tract: Normal adrenal glands. Multiple bilateral renal collecting system calculi. Bilateral renal cysts, including a dominant lower pole 8.4 cm lesion. Mild anterior bladder wall thickening with subtle pericystic interstitial thickening. Example 122 and 123/2. Stomach/Bowel: Normal stomach, without wall thickening. Colonic stool burden suggests constipation. Normal terminal ileum. Normal small bowel. Vascular/Lymphatic: Advanced aortic and branch vessel atherosclerosis. Multiple small abdominal retroperitoneal nodes. None pathologic by size criteria. index aortocaval node measures 8 mm on 77/2 versus 6 mm on the prior. No pelvic sidewall adenopathy. Soft tissue density adjacent to the bladder measures 9 mm on 124/2 versus 7 mm on the prior. Especially when compared back to 08/27/2016, favored to represent a varix. Reproductive: Normal prostate. Other: No significant free fluid. No evidence of omental or peritoneal disease. Musculoskeletal: Left iliac sclerotic lesion is similar and likely a bone island. Lumbosacral spondylosis. IMPRESSION: 1. New central right upper lobe pulmonary nodule with differential considerations of metachronous primary bronchogenic carcinoma or recurrent lymphoma. 2. Slight increase in size of nodes within the chest and abdomen, for which recurrent lymphoma cannot be excluded. Consider PET to direct tissue sampling. 3. Aortic atherosclerosis (ICD10-I70.0), coronary artery atherosclerosis  and emphysema (ICD10-J43.9). 4. Pulmonary artery enlargement suggests pulmonary arterial hypertension. 5. Peribronchovascular interstitial thickening is similar at the left lung base and increased at the right lung base. Considerations include chronic atypical infection or aspiration. 6. Bilateral nephrolithiasis. 7. Mild bladder wall thickening and pericystic edema. Correlate with any symptoms of cystitis or bladder outlet obstruction. 8. Right pleural calcifications, suggesting prior hemothorax or empyema. Electronically Signed   By: Abigail Miyamoto M.D.   On: 09/08/2019 08:40    ASSESSMENT AND PLAN:  This is a very pleasant 68 years old white male with stage IIIa large B-cell non-Hodgkin lymphoma status post 6 cycles of systemic chemotherapy with CHOP/Rituxan with almost complete response. The patient has been in observation since completion of his systemic chemotherapy and has been doing fine. He had repeat CT scan of the chest, abdomen pelvis performed recently.  I personally and independently reviewed the scans and discussed the result and showed the images to the patient today. Unfortunately his a scan showed development of new central right upper lobe pulmonary nodule suspicious for lung cancer or recurrent lymphoma. I recommended for the patient to have a PET scan for further evaluation of this lesion before consideration of biopsy and treatment. I will see him back for follow-up visit in 2 weeks for evaluation and more detailed discussion of his treatment options based on the PET scan. He will have Port-A-Cath flush every 2 months. The patient was advised to call immediately if he has any concerning symptoms in the interval. The patient voices understanding of current disease status and treatment options and is in agreement with the current care plan. All questions were  answered. The patient knows to call the clinic with any problems, questions or concerns. We can certainly see the patient much  sooner if necessary.  Disclaimer: This note was dictated with voice recognition software. Similar sounding words can inadvertently be transcribed and may not be corrected upon review.

## 2019-09-12 ENCOUNTER — Telehealth: Payer: Self-pay | Admitting: Internal Medicine

## 2019-09-12 NOTE — Telephone Encounter (Signed)
Scheduled per los. Called and spoke with patient. Confirmed appt 

## 2019-09-20 ENCOUNTER — Other Ambulatory Visit: Payer: Self-pay

## 2019-09-20 ENCOUNTER — Encounter (HOSPITAL_COMMUNITY)
Admission: RE | Admit: 2019-09-20 | Discharge: 2019-09-20 | Disposition: A | Discharge status: Home / Self Care | Payer: PPO | Source: Ambulatory Visit | Attending: Internal Medicine | Admitting: Internal Medicine

## 2019-09-20 DIAGNOSIS — R911 Solitary pulmonary nodule: Secondary | ICD-10-CM | POA: Diagnosis not present

## 2019-09-20 DIAGNOSIS — I509 Heart failure, unspecified: Secondary | ICD-10-CM | POA: Insufficient documentation

## 2019-09-20 DIAGNOSIS — R918 Other nonspecific abnormal finding of lung field: Secondary | ICD-10-CM | POA: Diagnosis not present

## 2019-09-20 DIAGNOSIS — I110 Hypertensive heart disease with heart failure: Secondary | ICD-10-CM | POA: Diagnosis not present

## 2019-09-20 DIAGNOSIS — J449 Chronic obstructive pulmonary disease, unspecified: Secondary | ICD-10-CM | POA: Insufficient documentation

## 2019-09-20 DIAGNOSIS — Z79899 Other long term (current) drug therapy: Secondary | ICD-10-CM | POA: Diagnosis not present

## 2019-09-20 LAB — GLUCOSE, CAPILLARY: Glucose-Capillary: 90 mg/dL (ref 70–99)

## 2019-09-20 MED ORDER — FLUDEOXYGLUCOSE F - 18 (FDG) INJECTION
12.3000 | Freq: Once | INTRAVENOUS | Status: AC
  Administered 2019-09-20: 13:00:00 12.3 via INTRAVENOUS

## 2019-09-25 ENCOUNTER — Inpatient Hospital Stay: Payer: PPO | Attending: Internal Medicine | Admitting: Internal Medicine

## 2019-09-25 ENCOUNTER — Other Ambulatory Visit: Payer: Self-pay

## 2019-09-25 ENCOUNTER — Encounter: Payer: Self-pay | Admitting: Internal Medicine

## 2019-09-25 VITALS — BP 163/97 | HR 82 | Temp 97.9°F | Resp 18 | Ht 73.0 in | Wt 233.4 lb

## 2019-09-25 DIAGNOSIS — C3491 Malignant neoplasm of unspecified part of right bronchus or lung: Secondary | ICD-10-CM | POA: Diagnosis not present

## 2019-09-25 DIAGNOSIS — I10 Essential (primary) hypertension: Secondary | ICD-10-CM | POA: Diagnosis not present

## 2019-09-25 DIAGNOSIS — I110 Hypertensive heart disease with heart failure: Secondary | ICD-10-CM | POA: Diagnosis not present

## 2019-09-25 DIAGNOSIS — J449 Chronic obstructive pulmonary disease, unspecified: Secondary | ICD-10-CM | POA: Insufficient documentation

## 2019-09-25 DIAGNOSIS — C3411 Malignant neoplasm of upper lobe, right bronchus or lung: Secondary | ICD-10-CM | POA: Insufficient documentation

## 2019-09-25 DIAGNOSIS — I509 Heart failure, unspecified: Secondary | ICD-10-CM | POA: Insufficient documentation

## 2019-09-25 DIAGNOSIS — C8258 Diffuse follicle center lymphoma, lymph nodes of multiple sites: Secondary | ICD-10-CM

## 2019-09-25 DIAGNOSIS — I2510 Atherosclerotic heart disease of native coronary artery without angina pectoris: Secondary | ICD-10-CM | POA: Insufficient documentation

## 2019-09-25 DIAGNOSIS — N400 Enlarged prostate without lower urinary tract symptoms: Secondary | ICD-10-CM | POA: Insufficient documentation

## 2019-09-25 DIAGNOSIS — E039 Hypothyroidism, unspecified: Secondary | ICD-10-CM | POA: Insufficient documentation

## 2019-09-25 DIAGNOSIS — I700 Atherosclerosis of aorta: Secondary | ICD-10-CM | POA: Insufficient documentation

## 2019-09-25 DIAGNOSIS — Z79899 Other long term (current) drug therapy: Secondary | ICD-10-CM | POA: Insufficient documentation

## 2019-09-25 DIAGNOSIS — F329 Major depressive disorder, single episode, unspecified: Secondary | ICD-10-CM | POA: Insufficient documentation

## 2019-09-25 DIAGNOSIS — N200 Calculus of kidney: Secondary | ICD-10-CM | POA: Insufficient documentation

## 2019-09-25 DIAGNOSIS — E669 Obesity, unspecified: Secondary | ICD-10-CM | POA: Diagnosis not present

## 2019-09-25 DIAGNOSIS — I272 Pulmonary hypertension, unspecified: Secondary | ICD-10-CM | POA: Insufficient documentation

## 2019-09-25 DIAGNOSIS — Z8572 Personal history of non-Hodgkin lymphomas: Secondary | ICD-10-CM | POA: Diagnosis not present

## 2019-09-25 NOTE — Progress Notes (Signed)
[place] [phone]   [phone]  OFFICE PROGRESS NOTE  [name], [place] [address] [place] [address]  DIAGNOSIS: Stage III large B-cell non-Hodgkin lymphoma diagnosed in December 2016 and presented with extensive lymphadenopathy involving the neck, chest, abdomen as well as splenomegaly.   PRIOR THERAPY: Systemic chemotherapy with the CHOP/Rituxan every 3 weeks with Neulasta support. Status post 6 cycles.  CURRENT THERAPY: Observation.   INTERVAL HISTORY: [name] 69 y.o. male returns to the clinic today for follow-up visit.  The patient is feeling fine today with no concerning complaints.  He was very anxious about his recent scan results and also because he lost his dog recently.  He denied having any chest pain but continues to have shortness of breath with exertion with no cough or hemoptysis.  He denied having any fever or chills.  He has no nausea, vomiting, diarrhea or constipation.  He has no headache or visual changes.  He had a PET scan performed recently and is here for evaluation and discussion of his PET scan results and treatment options.  MEDICAL HISTORY: Past Medical History:  Diagnosis Date   BPH (benign prostatic hyperplasia) 2011   Congestive heart failure (HCC)    COPD (chronic obstructive pulmonary disease) (HCC)    Depression    Drug-induced neutropenia ([place]) 10/08/2015   Encounter for antineoplastic chemotherapy 10/08/2015   Gastrointestinal obstruction (HCC)    Ileus   Gout    Hypertension    Hypothyroid    Lung cancer ([place])    NHL (non-Hodgkin's lymphoma) ([place]) 09/25/2015   Obesity    Polycythemia    Pulmonary hypertension ([place])    Sepsis ([place]) 01/14/2016   Streptococcal pneumonia [facility]) August 2009    ALLERGIES:  has No Known Allergies.  MEDICATIONS:  Current Outpatient Medications  Medication Sig Dispense Refill   albuterol (PROVENTIL) (2.5 MG/3ML) 0.083% nebulizer  solution Take 3 mLs (2.5 mg total) by nebulization every 4 (four) hours as needed for wheezing or shortness of breath. 75 mL 3   allopurinol (ZYLOPRIM) 300 MG tablet Take 300 mg by mouth daily.     amLODipine (NORVASC) 10 MG tablet Take 10 mg by mouth daily.     betamethasone dipropionate 0.05 % lotion Apply 1 application topically 2 (two) times daily as needed (psorasis).      finasteride (PROSCAR) 5 MG tablet Take 5 mg by mouth daily.     ipratropium-albuterol (DUONEB) 0.5-2.5 (3) MG/3ML SOLN every 6 (six) hours as needed.     levofloxacin (LEVAQUIN) 500 MG tablet Take 500 mg by mouth daily.     levothyroxine (SYNTHROID, LEVOTHROID) 150 MCG tablet Take 150 mcg by mouth daily.     lisinopril (PRINIVIL,ZESTRIL) 10 MG tablet Take 1 tablet (10 mg total) by mouth daily. 30 tablet 11   omeprazole (PRILOSEC) 20 MG capsule TAKE 1 CAPSULE BY MOUTH TWICE DAILY BEFORE A MEAL 180 capsule 1   oxyCODONE-acetaminophen (PERCOCET) 10-325 MG tablet Take 1 tablet by mouth every 4 (four) hours as needed for pain.     predniSONE (DELTASONE) 10 MG tablet Take 4 tablets for one day, 3 tablets for one day, 2 tablets for one day, 1 tablet for one day, then 1/2 tablet (0.5 mg) for one day then stop. 11 tablet 0   PROAIR HFA 108 (90 Base) MCG/ACT inhaler Inhale 2 puffs into the lungs every 6 (six) hours as needed for wheezing or shortness of breath. 3  Inhaler 1   TRELEGY ELLIPTA 100-62.5-25 MCG/INH AEPB Inhale 1 puff into the lungs daily.     No current facility-administered medications for this visit.    SURGICAL HISTORY:  Past Surgical History:  Procedure Laterality Date   APPENDECTOMY     BACK SURGERY     CARDIOVERSION  11/27/2011   Procedure: CARDIOVERSION;  Surgeon: Thayer Headings, MD;  Location: [facility];  Service: Cardiovascular;  Laterality: N/A;   LUNG CANCER SURGERY  2011    REVIEW OF SYSTEMS:  Constitutional: positive for fatigue Eyes: negative Ears, nose, mouth, throat, and face:  negative Respiratory: positive for dyspnea on exertion Cardiovascular: negative Gastrointestinal: negative Genitourinary:negative Integument/breast: negative Hematologic/lymphatic: negative Musculoskeletal:negative Neurological: negative Behavioral/Psych: positive for anxiety Endocrine: negative Allergic/Immunologic: negative   PHYSICAL EXAMINATION: General appearance: alert, cooperative, appears stated age, fatigued and no distress Head: Normocephalic, without obvious abnormality, atraumatic Neck: no adenopathy, no JVD, supple, symmetrical, trachea midline and thyroid not enlarged, symmetric, no tenderness/mass/nodules Lymph nodes: Cervical, supraclavicular, and axillary nodes normal. Resp: clear to auscultation bilaterally Back: symmetric, no curvature. ROM normal. No CVA tenderness. Cardio: regular rate and rhythm, S1, S2 normal, no murmur, click, rub or gallop GI: soft, non-tender; bowel sounds normal; no masses,  no organomegaly Extremities: extremities normal, atraumatic, no cyanosis or edema Neurologic: Alert and oriented X 3, normal strength and tone. Normal symmetric reflexes. Normal coordination and gait  ECOG PERFORMANCE STATUS: 1 - Symptomatic but completely ambulatory  Blood pressure (!) 163/97, pulse 82, temperature 97.9 F (36.6 C), temperature source Temporal, resp. rate 18, height 6\' 1"  (1.854 m), weight 233 lb 6.4 oz (105.9 kg), SpO2 97 %.  LABORATORY DATA: Lab Results  Component Value Date   WBC 6.9 09/07/2019   HGB 12.2 (L) 09/07/2019   HCT 38.6 (L) 09/07/2019   MCV 92.1 09/07/2019   PLT 186 09/07/2019      Chemistry      Component Value Date/Time   NA 142 09/07/2019 0812   NA 140 09/07/2017 1322   K 4.7 09/07/2019 0812   K 5.2 No visable hemolysis (H) 09/07/2017 1322   CL 105 09/07/2019 0812   CO2 28 09/07/2019 0812   CO2 33 (H) 09/07/2017 1322   BUN 13 09/07/2019 0812   BUN 13.6 09/07/2017 1322   CREATININE 1.08 09/07/2019 0812   CREATININE  1.0 09/07/2017 1322      Component Value Date/Time   CALCIUM 8.8 (L) 09/07/2019 0812   CALCIUM 9.1 09/07/2017 1322   ALKPHOS 83 09/07/2019 0812   ALKPHOS 92 09/07/2017 1322   AST 10 (L) 09/07/2019 0812   AST 11 09/07/2017 1322   ALT 8 09/07/2019 0812   ALT 8 09/07/2017 1322   BILITOT 0.5 09/07/2019 0812   BILITOT 0.47 09/07/2017 1322       RADIOGRAPHIC STUDIES: CT Chest W Contrast  Result Date: 09/08/2019 CLINICAL DATA:  Non-Hodgkin's lymphoma diagnosed in 2016 with chemotherapy completed in 2017. Lung cancer in 2011 with partial left lung resection. Asymptomatic. EXAM: CT CHEST, ABDOMEN, AND PELVIS WITH CONTRAST TECHNIQUE: Multidetector CT imaging of the chest, abdomen and pelvis was performed following the standard protocol during bolus administration of intravenous contrast. CONTRAST:  16mL OMNIPAQUE IOHEXOL 300 MG/ML  SOLN COMPARISON:  09/06/2018 FINDINGS: CT CHEST FINDINGS Cardiovascular: Right Port-A-Cath tip at low SVC. Mild cardiomegaly, without pericardial effusion. Lad coronary artery atherosclerosis. No central pulmonary embolism, on this non-dedicated study. Pulmonary artery enlargement, outflow tract 3.4 cm. Mediastinum/Nodes: No supraclavicular adenopathy. A node within the azygoesophageal recess  measures 1.4 cm on 38/2 versus 1.3 cm on the prior. Borderline size right paratracheal node of 1.0 cm on 27/2, 7 mm previously. Lungs/Pleura: Right-sided pleural calcifications. Lower lobe predominant bronchial wall thickening. Presumed secretions within the right upper lobe bronchus and bronchus intermedius. Mild to moderate centrilobular emphysema. Development of a central right upper lobe pulmonary nodule of 2.4 x 1.8 cm on 66/4. A right lower lobe 4 mm nodule on 132/4 is new. Peribronchovascular interstitial thickening at both lung bases. Similar on the left and felt to be slightly progressive on the right. Presumed scarring within the posterolateral right upper lobe. Surgical sutures  in the left upper lobe. 3 mm left lower lobe pulmonary nodule on 114/4, similar. Musculoskeletal: No acute osseous abnormality. CT ABDOMEN PELVIS FINDINGS Hepatobiliary: Normal liver. Normal gallbladder, without biliary ductal dilatation. Pancreas: Normal, without mass or ductal dilatation. Spleen: No splenomegaly. inferior splenic 3.3 cm minimally complex cystic lesion is decreased in size from 3.6 cm previously. Adrenals/Urinary Tract: Normal adrenal glands. Multiple bilateral renal collecting system calculi. Bilateral renal cysts, including a dominant lower pole 8.4 cm lesion. Mild anterior bladder wall thickening with subtle pericystic interstitial thickening. Example 122 and 123/2. Stomach/Bowel: Normal stomach, without wall thickening. Colonic stool burden suggests constipation. Normal terminal ileum. Normal small bowel. Vascular/Lymphatic: Advanced aortic and branch vessel atherosclerosis. Multiple small abdominal retroperitoneal nodes. None pathologic by size criteria. index aortocaval node measures 8 mm on 77/2 versus 6 mm on the prior. No pelvic sidewall adenopathy. Soft tissue density adjacent to the bladder measures 9 mm on 124/2 versus 7 mm on the prior. Especially when compared back to 08/27/2016, favored to represent a varix. Reproductive: Normal prostate. Other: No significant free fluid. No evidence of omental or peritoneal disease. Musculoskeletal: Left iliac sclerotic lesion is similar and likely a bone island. Lumbosacral spondylosis. IMPRESSION: 1. New central right upper lobe pulmonary nodule with differential considerations of metachronous primary bronchogenic carcinoma or recurrent lymphoma. 2. Slight increase in size of nodes within the chest and abdomen, for which recurrent lymphoma cannot be excluded. Consider PET to direct tissue sampling. 3. Aortic atherosclerosis (ICD10-I70.0), coronary artery atherosclerosis and emphysema (ICD10-J43.9). 4. Pulmonary artery enlargement suggests  pulmonary arterial hypertension. 5. Peribronchovascular interstitial thickening is similar at the left lung base and increased at the right lung base. Considerations include chronic atypical infection or aspiration. 6. Bilateral nephrolithiasis. 7. Mild bladder wall thickening and pericystic edema. Correlate with any symptoms of cystitis or bladder outlet obstruction. 8. Right pleural calcifications, suggesting prior hemothorax or empyema. Electronically Signed   By: Abigail Miyamoto M.D.   On: 09/08/2019 08:40   CT Abdomen Pelvis W Contrast  Result Date: 09/08/2019 CLINICAL DATA:  Non-Hodgkin's lymphoma diagnosed in 2016 with chemotherapy completed in 2017. Lung cancer in 2011 with partial left lung resection. Asymptomatic. EXAM: CT CHEST, ABDOMEN, AND PELVIS WITH CONTRAST TECHNIQUE: Multidetector CT imaging of the chest, abdomen and pelvis was performed following the standard protocol during bolus administration of intravenous contrast. CONTRAST:  158mL OMNIPAQUE IOHEXOL 300 MG/ML  SOLN COMPARISON:  09/06/2018 FINDINGS: CT CHEST FINDINGS Cardiovascular: Right Port-A-Cath tip at low SVC. Mild cardiomegaly, without pericardial effusion. Lad coronary artery atherosclerosis. No central pulmonary embolism, on this non-dedicated study. Pulmonary artery enlargement, outflow tract 3.4 cm. Mediastinum/Nodes: No supraclavicular adenopathy. A node within the azygoesophageal recess measures 1.4 cm on 38/2 versus 1.3 cm on the prior. Borderline size right paratracheal node of 1.0 cm on 27/2, 7 mm previously. Lungs/Pleura: Right-sided pleural calcifications. Lower lobe predominant bronchial wall  thickening. Presumed secretions within the right upper lobe bronchus and bronchus intermedius. Mild to moderate centrilobular emphysema. Development of a central right upper lobe pulmonary nodule of 2.4 x 1.8 cm on 66/4. A right lower lobe 4 mm nodule on 132/4 is new. Peribronchovascular interstitial thickening at both lung bases.  Similar on the left and felt to be slightly progressive on the right. Presumed scarring within the posterolateral right upper lobe. Surgical sutures in the left upper lobe. 3 mm left lower lobe pulmonary nodule on 114/4, similar. Musculoskeletal: No acute osseous abnormality. CT ABDOMEN PELVIS FINDINGS Hepatobiliary: Normal liver. Normal gallbladder, without biliary ductal dilatation. Pancreas: Normal, without mass or ductal dilatation. Spleen: No splenomegaly. inferior splenic 3.3 cm minimally complex cystic lesion is decreased in size from 3.6 cm previously. Adrenals/Urinary Tract: Normal adrenal glands. Multiple bilateral renal collecting system calculi. Bilateral renal cysts, including a dominant lower pole 8.4 cm lesion. Mild anterior bladder wall thickening with subtle pericystic interstitial thickening. Example 122 and 123/2. Stomach/Bowel: Normal stomach, without wall thickening. Colonic stool burden suggests constipation. Normal terminal ileum. Normal small bowel. Vascular/Lymphatic: Advanced aortic and branch vessel atherosclerosis. Multiple small abdominal retroperitoneal nodes. None pathologic by size criteria. index aortocaval node measures 8 mm on 77/2 versus 6 mm on the prior. No pelvic sidewall adenopathy. Soft tissue density adjacent to the bladder measures 9 mm on 124/2 versus 7 mm on the prior. Especially when compared back to 08/27/2016, favored to represent a varix. Reproductive: Normal prostate. Other: No significant free fluid. No evidence of omental or peritoneal disease. Musculoskeletal: Left iliac sclerotic lesion is similar and likely a bone island. Lumbosacral spondylosis. IMPRESSION: 1. New central right upper lobe pulmonary nodule with differential considerations of metachronous primary bronchogenic carcinoma or recurrent lymphoma. 2. Slight increase in size of nodes within the chest and abdomen, for which recurrent lymphoma cannot be excluded. Consider PET to direct tissue sampling. 3.  Aortic atherosclerosis (ICD10-I70.0), coronary artery atherosclerosis and emphysema (ICD10-J43.9). 4. Pulmonary artery enlargement suggests pulmonary arterial hypertension. 5. Peribronchovascular interstitial thickening is similar at the left lung base and increased at the right lung base. Considerations include chronic atypical infection or aspiration. 6. Bilateral nephrolithiasis. 7. Mild bladder wall thickening and pericystic edema. Correlate with any symptoms of cystitis or bladder outlet obstruction. 8. Right pleural calcifications, suggesting prior hemothorax or empyema. Electronically Signed   By: Abigail Miyamoto M.D.   On: 09/08/2019 08:40   NM PET Image Restag (PS) Skull Base To Thigh  Result Date: 09/20/2019 CLINICAL DATA:  Initial treatment strategy for lung mass. History of lung cancer diagnosed 2011. History of Hodgkin's lymphoma diagnosed 2017. EXAM: NUCLEAR MEDICINE PET SKULL BASE TO THIGH TECHNIQUE: 12.3 mCi F-18 FDG was injected intravenously. Full-ring PET imaging was performed from the skull base to thigh after the radiotracer. CT data was obtained and used for attenuation correction and anatomic localization. Fasting blood glucose: 90 mg/dl COMPARISON:  CT 09/06/2018, PET-CT 02/24/2016 FINDINGS: Mediastinal blood pool activity: SUV max 3.0 Liver activity: SUV max 4.2 NECK: No hypermetabolic lymph nodes in the neck. Incidental CT findings: none CHEST: 2.5 x 2.0 cm lobular RIGHT upper lobe nodule has intense metabolic activity with SUV max equal 35.9. No additional hypermetabolic pulmonary nodules. No hypermetabolic mediastinal lymph nodes. No supraclavicular adenopathy.  No axillary adenopathy. Incidental CT findings: Coronary artery calcification and aortic atherosclerotic calcification. ABDOMEN/PELVIS: No abnormal hypermetabolic activity within the liver, pancreas, adrenal glands, or spleen. No hypermetabolic lymph nodes in the abdomen or pelvis. Incidental CT findings: Bilateral benign  renal  cysts. Atherosclerotic calcification of the aorta. SKELETON: No focal hypermetabolic activity to suggest skeletal metastasis. Incidental CT findings: none IMPRESSION: 1. Hypermetabolic lobular RIGHT upper lobe nodule consistent with malignancy. Differential include metachronous bronchogenic carcinoma versus lymphoma recurrence. Favor bronchogenic carcinoma. 2. No evidence of metastatic lymphadenopathy. Electronically Signed   By: Suzy Bouchard M.D.   On: 09/20/2019 15:49    ASSESSMENT AND PLAN:  This is a very pleasant 69 years old white male with stage IIIa large B-cell non-Hodgkin lymphoma status post 6 cycles of systemic chemotherapy with CHOP/Rituxan with almost complete response. The patient has been on observation for several years. Unfortunately his recent CT scan of the chest scan showed development of new central right upper lobe pulmonary nodule suspicious for lung cancer or recurrent lymphoma.  I order a PET scan which was performed recently.  I personally and independently reviewed the scan images and discussed the result and showed the images to the patient. His PET scan showed hypermetabolic right upper lobe nodule consistent with malignancy likely bronchogenic carcinoma. I recommended for the patient to see Dr. Chase Caller for consideration of bronchoscopy and tissue diagnosis.  I personally discussed the case with Dr. Chase Caller and he will arrange for the patient to have a follow-up appointment with him soon for this procedure. For hypertension, the patient was very anxious today about his imaging studies.  I recommended for him to take his blood pressure medication as prescribed at home and to monitor it closely. I will see him back for follow-up visit in less than 2 weeks for evaluation and more detailed discussion of his treatment options based on the final pathology and pulmonary function by Dr. Chase Caller. He was advised to call immediately if he has any concerning symptoms in the  interval. The patient voices understanding of current disease status and treatment options and is in agreement with the current care plan. All questions were answered. The patient knows to call the clinic with any problems, questions or concerns. We can certainly see the patient much sooner if necessary.  Disclaimer: This note was dictated with voice recognition software. Similar sounding words can inadvertently be transcribed and may not be corrected upon review.

## 2019-09-26 ENCOUNTER — Telehealth: Payer: Self-pay | Admitting: Internal Medicine

## 2019-09-26 DIAGNOSIS — J449 Chronic obstructive pulmonary disease, unspecified: Secondary | ICD-10-CM | POA: Diagnosis not present

## 2019-09-26 NOTE — Telephone Encounter (Signed)
Dr. Chase Caller, this patient was last seen by you on 06/12/19. Are you willing for this patient to be double booked on your schedule in order to be seen sooner than February?

## 2019-09-27 ENCOUNTER — Telehealth: Payer: Self-pay | Admitting: Internal Medicine

## 2019-09-27 NOTE — Telephone Encounter (Signed)
Patient returned call and has been scheduled to see Dr. Lamonte Sakai on 10/03/19 at 11:45 (per LL). I offered the patient an appointment on 10/04/19, but stated he was told by Dr. Julien Nordmann to be seen by pulmonary before his return appointment to oncology on 10/04/19. Nothing further needed at this time.

## 2019-09-27 NOTE — Telephone Encounter (Signed)
[facility] spoke with Tedra Coupe. She stated she will have to forward the request for the Super D because they only have access to the information for 2 weeks from the date it was done. They will check to see if they can access this information and call our office back to confirm if it is available.  LVM on patient cell and an additional message on his wife's cell # also regarding appointment availability week of 10/02/19 with Dr. Lamonte Sakai.  Received the information below from front desk while leaving message for the patient:  "[3:43 PM] [facility], [facility] dept calling back Aura Fey about Super D CD it was done as a chest , abdomin and pelvis so they are unable to do Super D ?[3:43 PM] [facility], [place] if you have any questions"

## 2019-09-27 NOTE — Telephone Encounter (Signed)
There is a hypermetabolic right upper lobe nodule that needs a navigational bronchoscopy.  Dr. Julien Nordmann requests a biopsy.  Plan -Please ensure there is a super dimension disc of the most recent CT scan of the chest -Please set up appointment with Dr. Baltazar Apo or Dr. June Leap, ASAP for evaluation of navigational bronchoscopy.

## 2019-09-27 NOTE — Telephone Encounter (Signed)
Scheduled per los. Called and left msg.

## 2019-10-03 ENCOUNTER — Other Ambulatory Visit: Payer: Self-pay

## 2019-10-03 ENCOUNTER — Ambulatory Visit: Payer: PPO | Admitting: Emergency Medicine

## 2019-10-03 ENCOUNTER — Encounter: Payer: Self-pay | Admitting: Emergency Medicine

## 2019-10-03 ENCOUNTER — Telehealth: Payer: Self-pay | Admitting: Emergency Medicine

## 2019-10-03 VITALS — BP 144/94 | HR 91 | Ht 72.0 in | Wt 237.0 lb

## 2019-10-03 DIAGNOSIS — J449 Chronic obstructive pulmonary disease, unspecified: Secondary | ICD-10-CM

## 2019-10-03 DIAGNOSIS — R911 Solitary pulmonary nodule: Secondary | ICD-10-CM | POA: Diagnosis not present

## 2019-10-03 HISTORY — DX: Solitary pulmonary nodule: R91.1

## 2019-10-03 LAB — BASIC METABOLIC PANEL
BUN: 14 mg/dL (ref 6–23)
CO2: 31 mEq/L (ref 19–32)
Calcium: 9.6 mg/dL (ref 8.4–10.5)
Chloride: 103 mEq/L (ref 96–112)
Creatinine, Ser: 0.97 mg/dL (ref 0.40–1.50)
GFR: 76.73 mL/min (ref >60.00)
Glucose, Bld: 130 mg/dL — ABNORMAL HIGH (ref 70–99)
Potassium: 5 mEq/L (ref 3.5–5.1)
Sodium: 140 mEq/L (ref 135–145)

## 2019-10-03 LAB — CBC WITH DIFFERENTIAL/PLATELET
Basophils Absolute: 0 10*3/uL (ref 0.0–0.1)
Basophils Relative: 0.4 % (ref 0.0–3.0)
Eosinophils Absolute: 0.3 10*3/uL (ref 0.0–0.7)
Eosinophils Relative: 4.2 % (ref 0.0–5.0)
HCT: 39.4 % (ref 39.0–52.0)
Hemoglobin: 12.7 g/dL — ABNORMAL LOW (ref 13.0–17.0)
Lymphocytes Relative: 16 % (ref 12.0–46.0)
Lymphs Abs: 1.1 10*3/uL (ref 0.7–4.0)
MCHC: 32.3 g/dL (ref 30.0–36.0)
MCV: 90.8 fl (ref 78.0–100.0)
Monocytes Absolute: 0.4 10*3/uL (ref 0.1–1.0)
Monocytes Relative: 5.9 % (ref 3.0–12.0)
Neutro Abs: 4.9 10*3/uL (ref 1.4–7.7)
Neutrophils Relative %: 73.5 % (ref 43.0–77.0)
Platelets: 194 10*3/uL (ref 150.0–400.0)
RBC: 4.34 Mil/uL (ref 4.22–5.81)
RDW: 14.8 % (ref 11.5–15.5)
WBC: 6.6 10*3/uL (ref 4.0–10.5)

## 2019-10-03 LAB — PROTIME-INR
INR: 1 ratio (ref 0.8–1.0)
Prothrombin Time: 12.2 s (ref 9.6–13.1)

## 2019-10-03 NOTE — Progress Notes (Signed)
Subjective:    Patient ID: [name], male    DOB: April 08, 1951, 69 y.o.   MRN: [id number]  HPI 69 year old former heavy smoker (120 pack years) followed in our office by Dr. Chase Caller for COPD and post left upper lobe lobectomy for squamous cell lung cancer (08/2010).  He is also followed by Dr. Julien Nordmann in oncology for this as well as stage III large B cell non-Hodgkin's lymphoma (2016) treated with systemic CHOP/Rituxan.  He has been on observation since having a great response to chemotherapy.  Surveillance CT chest abdomen pelvis from 12/18 reviewed by me, showed a new central right upper lobe nodule and some increase in his thoracic and abdominal lymphadenopathy, peribronchovascular interstitial thickening at bilateral bases.  A PET scan was done on 12/30 that I reviewed, confirms that the right upper lobe nodule is hypermetabolic. His mediastinal lymphadenopathy was PET negative.   Review of Systems  Constitutional: Negative for activity change, appetite change, chills, diaphoresis, fatigue, fever and unexpected weight change.  HENT: Positive for congestion. Negative for dental problem, nosebleeds, postnasal drip, rhinorrhea, sinus pressure, sneezing, trouble swallowing and voice change.   Eyes: Negative for itching and visual disturbance.  Respiratory: Positive for cough, shortness of breath and wheezing. Negative for choking, chest tightness and stridor.   Cardiovascular: Negative for chest pain, palpitations and leg swelling.  Gastrointestinal: Negative for abdominal pain.  Musculoskeletal: Negative for joint swelling and myalgias.  Skin: Negative for rash.  Neurological: Negative for syncope, light-headedness and headaches.  Psychiatric/Behavioral: Negative for sleep disturbance.      Past Medical History:  Diagnosis Date  . BPH (benign prostatic hyperplasia) 2011  . Congestive heart failure ([place])   . COPD (chronic obstructive pulmonary disease) ([place])   . Depression   .  Drug-induced neutropenia ([place]) 10/08/2015  . Encounter for antineoplastic chemotherapy 10/08/2015  . Gastrointestinal obstruction ([facility])    Ileus  . Gout   . Hypertension   . Hypothyroid   . Lung cancer ([place])   . NHL (non-Hodgkin's lymphoma) ([place]) 09/25/2015  . Obesity   . Polycythemia   . Pulmonary hypertension ([place])   . Sepsis ([place]) 01/14/2016  . Streptococcal pneumonia [facility]) August 2009     Family History  Problem Relation Age of Onset  . Heart disease Father   . Cancer Neg Hx   . Diabetes Neg Hx   . Stroke Neg Hx   . Hypertension Neg Hx   . Kidney disease Neg Hx      Social History   Socioeconomic History  . Marital status: Married    Spouse name: Not on file  . Number of children: Not on file  . Years of education: Not on file  . Highest education level: Not on file  Occupational History  . Occupation: truck Geophysicist/field seismologist  Tobacco Use  . Smoking status: Former Smoker    Packs/day: 3.00    Years: 43.00    Pack years: 129.00    Types: Cigarettes    Quit date: 10/22/2001    Years since quitting: 17.9  . Smokeless tobacco: Never Used  Substance and Sexual Activity  . Alcohol use: No  . Drug use: No  . Sexual activity: Yes  Other Topics Concern  . Not on file  Social History Narrative   Resides in [place] with his wife.  Has 1 daughter and 1 granddaughter.   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   Social Determinants of Health   Financial  Resource Strain:   . Difficulty of Paying Living Expenses: Not on file  Food Insecurity:   . Worried About Charity fundraiser in the Last Year: Not on file  . Ran Out of Food in the Last Year: Not on file  Transportation Needs:   . Lack of Transportation (Medical): Not on file  . Lack of Transportation (Non-Medical): Not on file  Physical Activity:   . Days of Exercise per Week: Not on file  . Minutes of Exercise per Session: Not on file  Stress:   . Feeling of Stress : Not on file  Social  Connections:   . Frequency of Communication with Friends and Family: Not on file  . Frequency of Social Gatherings with Friends and Family: Not on file  . Attends Religious Services: Not on file  . Active Member of Clubs or Organizations: Not on file  . Attends Archivist Meetings: Not on file  . Marital Status: Not on file  Intimate Partner Violence:   . Fear of Current or Ex-Partner: Not on file  . Emotionally Abused: Not on file  . Physically Abused: Not on file  . Sexually Abused: Not on file     No Known Allergies   Outpatient Medications Prior to Visit  Medication Sig Dispense Refill  . albuterol (PROVENTIL) (2.5 MG/3ML) 0.083% nebulizer solution Take 3 mLs (2.5 mg total) by nebulization every 4 (four) hours as needed for wheezing or shortness of breath. 75 mL 3  . allopurinol (ZYLOPRIM) 300 MG tablet Take 300 mg by mouth daily.    [name] amLODipine (NORVASC) 10 MG tablet Take 10 mg by mouth daily.    . betamethasone dipropionate 0.05 % lotion Apply 1 application topically 2 (two) times daily as needed (psorasis).     . finasteride (PROSCAR) 5 MG tablet Take 5 mg by mouth daily.    [name] ipratropium-albuterol (DUONEB) 0.5-2.5 (3) MG/3ML SOLN every 6 (six) hours as needed.    [name] levothyroxine (SYNTHROID, LEVOTHROID) 150 MCG tablet Take 150 mcg by mouth daily.    [name] lisinopril (PRINIVIL,ZESTRIL) 10 MG tablet Take 1 tablet (10 mg total) by mouth daily. 30 tablet 11  . omeprazole (PRILOSEC) 20 MG capsule TAKE 1 CAPSULE BY MOUTH TWICE DAILY BEFORE A MEAL 180 capsule 1  . oxyCODONE-acetaminophen (PERCOCET) 10-325 MG tablet Take 1 tablet by mouth every 4 (four) hours as needed for pain.    [name] PROAIR HFA 108 (90 Base) MCG/ACT inhaler Inhale 2 puffs into the lungs every 6 (six) hours as needed for wheezing or shortness of breath. 3 Inhaler 1  . TRELEGY ELLIPTA 100-62.5-25 MCG/INH AEPB Inhale 1 puff into the lungs daily.    [name] levofloxacin (LEVAQUIN) 500 MG tablet Take 500 mg by mouth daily.     . predniSONE (DELTASONE) 10 MG tablet Take 4 tablets for one day, 3 tablets for one day, 2 tablets for one day, 1 tablet for one day, then 1/2 tablet (0.5 mg) for one day then stop. 11 tablet 0   No facility-administered medications prior to visit.        Objective:   Physical Exam Vitals:   10/03/19 1200  BP: (!) 144/94  Pulse: 91  SpO2: 96%  Weight: 237 lb (107.5 kg)  Height: 6' (1.829 m)   Gen: Pleasant, well-nourished, in no distress,  normal affect  ENT: No lesions,  mouth clear,  oropharynx clear, no postnasal drip  Neck: No JVD, no stridor  Lungs: No use of accessory  muscles, B insp and exp rhonchi, R > L   Cardiovascular: RRR, heart sounds normal, no murmur or gallops, no peripheral edema  Musculoskeletal: No deformities, no cyanosis or clubbing  Neuro: alert, awake, non focal  Skin: Warm, no lesions or rash      Assessment & Plan:  Pulmonary nodule, right New right upper lobe pulmonary nodule on surveillance CT, hypermetabolic on follow-up PET scan.  2.4 cm in largest diameter, lobulated and solid in appearance.  He needs a tissue diagnosis to differentiate between his lymphoma, possible primary lung cancer.  I discussed this in detail with him today.  I recommended navigational bronchoscopy, possible endobronchial ultrasound to evaluate for associated lymphadenopathy.  I am working on scheduling this for 1/20.  Plan for super D CT scan ASAP, lab work today.  I discussed the pros and cons of procedure with him, the risk benefits.  He understands and wants to proceed.  He has follow-up with Dr. Julien Nordmann tomorrow.  COPD, severe ([place]) On Trelegy.  Tolerating.  He has daily cough, dyspnea but overall stable.  No evidence of exacerbation on this exam.  Baltazar Apo, MD, PhD 10/03/2019, 12:36 PM [facility] [phone] or if no answer [phone]

## 2019-10-03 NOTE — Assessment & Plan Note (Signed)
New right upper lobe pulmonary nodule on surveillance CT, hypermetabolic on follow-up PET scan.  2.4 cm in largest diameter, lobulated and solid in appearance.  He needs a tissue diagnosis to differentiate between his lymphoma, possible primary lung cancer.  I discussed this in detail with him today.  I recommended navigational bronchoscopy, possible endobronchial ultrasound to evaluate for associated lymphadenopathy.  I am working on scheduling this for 1/20.  Plan for super D CT scan ASAP, lab work today.  I discussed the pros and cons of procedure with him, the risk benefits.  He understands and wants to proceed.  He has follow-up with Dr. Julien Nordmann tomorrow.

## 2019-10-03 NOTE — Progress Notes (Signed)
These preliminary result these preliminary results were noted.  Awaiting final report.

## 2019-10-03 NOTE — Assessment & Plan Note (Signed)
On Trelegy.  Tolerating.  He has daily cough, dyspnea but overall stable.  No evidence of exacerbation on this exam.

## 2019-10-03 NOTE — H&P (View-Only) (Signed)
Subjective:    Patient ID: [name], male    DOB: 23-Jul-1951, 69 y.o.   MRN: [id number]  HPI 69 year old former heavy smoker (120 pack years) followed in our office by Dr. Chase Caller for COPD and post left upper lobe lobectomy for squamous cell lung cancer (08/2010).  He is also followed by Dr. Julien Nordmann in oncology for this as well as stage III large B cell non-Hodgkin's lymphoma (2016) treated with systemic CHOP/Rituxan.  He has been on observation since having a great response to chemotherapy.  Surveillance CT chest abdomen pelvis from 12/18 reviewed by me, showed a new central right upper lobe nodule and some increase in his thoracic and abdominal lymphadenopathy, peribronchovascular interstitial thickening at bilateral bases.  A PET scan was done on 12/30 that I reviewed, confirms that the right upper lobe nodule is hypermetabolic. His mediastinal lymphadenopathy was PET negative.   Review of Systems  Constitutional: Negative for activity change, appetite change, chills, diaphoresis, fatigue, fever and unexpected weight change.  HENT: Positive for congestion. Negative for dental problem, nosebleeds, postnasal drip, rhinorrhea, sinus pressure, sneezing, trouble swallowing and voice change.   Eyes: Negative for itching and visual disturbance.  Respiratory: Positive for cough, shortness of breath and wheezing. Negative for choking, chest tightness and stridor.   Cardiovascular: Negative for chest pain, palpitations and leg swelling.  Gastrointestinal: Negative for abdominal pain.  Musculoskeletal: Negative for joint swelling and myalgias.  Skin: Negative for rash.  Neurological: Negative for syncope, light-headedness and headaches.  Psychiatric/Behavioral: Negative for sleep disturbance.      Past Medical History:  Diagnosis Date  . BPH (benign prostatic hyperplasia) 2011  . Congestive heart failure ([place])   . COPD (chronic obstructive pulmonary disease) ([place])   . Depression   .  Drug-induced neutropenia ([place]) 10/08/2015  . Encounter for antineoplastic chemotherapy 10/08/2015  . Gastrointestinal obstruction ([facility])    Ileus  . Gout   . Hypertension   . Hypothyroid   . Lung cancer ([place])   . NHL (non-Hodgkin's lymphoma) ([place]) 09/25/2015  . Obesity   . Polycythemia   . Pulmonary hypertension ([place])   . Sepsis ([place]) 01/14/2016  . Streptococcal pneumonia [facility]) August 2009     Family History  Problem Relation Age of Onset  . Heart disease Father   . Cancer Neg Hx   . Diabetes Neg Hx   . Stroke Neg Hx   . Hypertension Neg Hx   . Kidney disease Neg Hx      Social History   Socioeconomic History  . Marital status: Married    Spouse name: Not on file  . Number of children: Not on file  . Years of education: Not on file  . Highest education level: Not on file  Occupational History  . Occupation: truck Geophysicist/field seismologist  Tobacco Use  . Smoking status: Former Smoker    Packs/day: 3.00    Years: 43.00    Pack years: 129.00    Types: Cigarettes    Quit date: 10/22/2001    Years since quitting: 17.9  . Smokeless tobacco: Never Used  Substance and Sexual Activity  . Alcohol use: No  . Drug use: No  . Sexual activity: Yes  Other Topics Concern  . Not on file  Social History Narrative   Resides in [place] with his wife.  Has 1 daughter and 1 granddaughter.   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   Social Determinants of Health   Financial  Resource Strain:   . Difficulty of Paying Living Expenses: Not on file  Food Insecurity:   . Worried About Charity fundraiser in the Last Year: Not on file  . Ran Out of Food in the Last Year: Not on file  Transportation Needs:   . Lack of Transportation (Medical): Not on file  . Lack of Transportation (Non-Medical): Not on file  Physical Activity:   . Days of Exercise per Week: Not on file  . Minutes of Exercise per Session: Not on file  Stress:   . Feeling of Stress : Not on file  Social  Connections:   . Frequency of Communication with Friends and Family: Not on file  . Frequency of Social Gatherings with Friends and Family: Not on file  . Attends Religious Services: Not on file  . Active Member of Clubs or Organizations: Not on file  . Attends Archivist Meetings: Not on file  . Marital Status: Not on file  Intimate Partner Violence:   . Fear of Current or Ex-Partner: Not on file  . Emotionally Abused: Not on file  . Physically Abused: Not on file  . Sexually Abused: Not on file     No Known Allergies   Outpatient Medications Prior to Visit  Medication Sig Dispense Refill  . albuterol (PROVENTIL) (2.5 MG/3ML) 0.083% nebulizer solution Take 3 mLs (2.5 mg total) by nebulization every 4 (four) hours as needed for wheezing or shortness of breath. 75 mL 3  . allopurinol (ZYLOPRIM) 300 MG tablet Take 300 mg by mouth daily.    [name] amLODipine (NORVASC) 10 MG tablet Take 10 mg by mouth daily.    . betamethasone dipropionate 0.05 % lotion Apply 1 application topically 2 (two) times daily as needed (psorasis).     . finasteride (PROSCAR) 5 MG tablet Take 5 mg by mouth daily.    [name] ipratropium-albuterol (DUONEB) 0.5-2.5 (3) MG/3ML SOLN every 6 (six) hours as needed.    [name] levothyroxine (SYNTHROID, LEVOTHROID) 150 MCG tablet Take 150 mcg by mouth daily.    [name] lisinopril (PRINIVIL,ZESTRIL) 10 MG tablet Take 1 tablet (10 mg total) by mouth daily. 30 tablet 11  . omeprazole (PRILOSEC) 20 MG capsule TAKE 1 CAPSULE BY MOUTH TWICE DAILY BEFORE A MEAL 180 capsule 1  . oxyCODONE-acetaminophen (PERCOCET) 10-325 MG tablet Take 1 tablet by mouth every 4 (four) hours as needed for pain.    [name] PROAIR HFA 108 (90 Base) MCG/ACT inhaler Inhale 2 puffs into the lungs every 6 (six) hours as needed for wheezing or shortness of breath. 3 Inhaler 1  . TRELEGY ELLIPTA 100-62.5-25 MCG/INH AEPB Inhale 1 puff into the lungs daily.    [name] levofloxacin (LEVAQUIN) 500 MG tablet Take 500 mg by mouth daily.     . predniSONE (DELTASONE) 10 MG tablet Take 4 tablets for one day, 3 tablets for one day, 2 tablets for one day, 1 tablet for one day, then 1/2 tablet (0.5 mg) for one day then stop. 11 tablet 0   No facility-administered medications prior to visit.        Objective:   Physical Exam Vitals:   10/03/19 1200  BP: (!) 144/94  Pulse: 91  SpO2: 96%  Weight: 237 lb (107.5 kg)  Height: 6' (1.829 m)   Gen: Pleasant, well-nourished, in no distress,  normal affect  ENT: No lesions,  mouth clear,  oropharynx clear, no postnasal drip  Neck: No JVD, no stridor  Lungs: No use of accessory  muscles, B insp and exp rhonchi, R > L   Cardiovascular: RRR, heart sounds normal, no murmur or gallops, no peripheral edema  Musculoskeletal: No deformities, no cyanosis or clubbing  Neuro: alert, awake, non focal  Skin: Warm, no lesions or rash      Assessment & Plan:  Pulmonary nodule, right New right upper lobe pulmonary nodule on surveillance CT, hypermetabolic on follow-up PET scan.  2.4 cm in largest diameter, lobulated and solid in appearance.  He needs a tissue diagnosis to differentiate between his lymphoma, possible primary lung cancer.  I discussed this in detail with him today.  I recommended navigational bronchoscopy, possible endobronchial ultrasound to evaluate for associated lymphadenopathy.  I am working on scheduling this for 1/20.  Plan for super D CT scan ASAP, lab work today.  I discussed the pros and cons of procedure with him, the risk benefits.  He understands and wants to proceed.  He has follow-up with Dr. Julien Nordmann tomorrow.  COPD, severe ([place]) On Trelegy.  Tolerating.  He has daily cough, dyspnea but overall stable.  No evidence of exacerbation on this exam.  Baltazar Apo, MD, PhD 10/03/2019, 12:36 PM [facility] [phone] or if no answer [phone]

## 2019-10-03 NOTE — Patient Instructions (Signed)
We will arrange for navigational bronchoscopy and biopsies of your right upper lobe nodule, any enlarged lymph nodes in the chest.  Tentatively planning for 10/11/2019, details to follow. We will repeat your CT scan of the chest before the procedure Lab work today Please continue your Trelegy as you have been taking it Follow with Dr. Julien Nordmann 10/04/2019 as planned Follow with either Dr. Chase Caller or Dr. Lamonte Sakai next available after your procedure.

## 2019-10-03 NOTE — Telephone Encounter (Signed)
Spoke with the pt He states that he has already spoken with [facility] and is aware of CT appt date and time  Nothing further needed

## 2019-10-04 ENCOUNTER — Inpatient Hospital Stay: Payer: PPO | Admitting: Internal Medicine

## 2019-10-04 ENCOUNTER — Encounter: Payer: Self-pay | Admitting: Internal Medicine

## 2019-10-04 ENCOUNTER — Other Ambulatory Visit: Payer: Self-pay

## 2019-10-04 VITALS — BP 144/71 | HR 95 | Temp 98.5°F | Resp 18 | Ht 72.0 in | Wt 236.4 lb

## 2019-10-04 DIAGNOSIS — I10 Essential (primary) hypertension: Secondary | ICD-10-CM

## 2019-10-04 DIAGNOSIS — C8258 Diffuse follicle center lymphoma, lymph nodes of multiple sites: Secondary | ICD-10-CM

## 2019-10-04 DIAGNOSIS — C3411 Malignant neoplasm of upper lobe, right bronchus or lung: Secondary | ICD-10-CM | POA: Diagnosis not present

## 2019-10-04 DIAGNOSIS — C3490 Malignant neoplasm of unspecified part of unspecified bronchus or lung: Secondary | ICD-10-CM

## 2019-10-04 NOTE — Progress Notes (Signed)
[facility] [phone]   [phone]  OFFICE PROGRESS NOTE  [name], [place] [address] [place] [address]  DIAGNOSIS: Stage III large B-cell non-Hodgkin lymphoma diagnosed in December 2016 and presented with extensive lymphadenopathy involving the neck, chest, abdomen as well as splenomegaly.   PRIOR THERAPY: Systemic chemotherapy with the CHOP/Rituxan every 3 weeks with Neulasta support. Status post 6 cycles.  CURRENT THERAPY: Observation.   INTERVAL HISTORY: [name] 69 y.o. male returns to the clinic today for follow-up visit.  The patient is feeling fine today with no concerning complaints.  He was seen by Dr. Lamonte Sakai yesterday and expected to have CT super D of the chest tomorrow followed by navigational bronchoscopy on October 12, 2018.  He is feeling fine except for shortness of breath with exertion.  He denied having any chest pain, cough or hemoptysis.  He denied having any fever or chills.  He has no nausea, vomiting, diarrhea or constipation.  He is here today for reevaluation.  MEDICAL HISTORY: Past Medical History:  Diagnosis Date  . BPH (benign prostatic hyperplasia) 2011  . Congestive heart failure ([facility])   . COPD (chronic obstructive pulmonary disease) ([facility])   . Depression   . Drug-induced neutropenia ([facility]) 10/08/2015  . Encounter for antineoplastic chemotherapy 10/08/2015  . Gastrointestinal obstruction ([facility])    Ileus  . Gout   . Hypertension   . Hypothyroid   . Lung cancer ([facility])   . NHL (non-Hodgkin's lymphoma) ([facility]) 09/25/2015  . Obesity   . Polycythemia   . Pulmonary hypertension ([facility])   . Sepsis ([facility]) 01/14/2016  . Streptococcal pneumonia [facility]) August 2009    ALLERGIES:  has No Known Allergies.  MEDICATIONS:  Current Outpatient Medications  Medication Sig Dispense Refill  . albuterol (PROVENTIL) (2.5 MG/3ML) 0.083% nebulizer solution Take 3 mLs (2.5 mg total) by nebulization every 4 (four) hours as  needed for wheezing or shortness of breath. 75 mL 3  . allopurinol (ZYLOPRIM) 300 MG tablet Take 300 mg by mouth daily.    [name] amLODipine (NORVASC) 10 MG tablet Take 10 mg by mouth daily.    . betamethasone dipropionate 0.05 % lotion Apply 1 application topically 2 (two) times daily as needed (psorasis).     . finasteride (PROSCAR) 5 MG tablet Take 5 mg by mouth daily.    [name] ipratropium-albuterol (DUONEB) 0.5-2.5 (3) MG/3ML SOLN every 6 (six) hours as needed.    [name] levothyroxine (SYNTHROID, LEVOTHROID) 150 MCG tablet Take 150 mcg by mouth daily.    [name] lisinopril (PRINIVIL,ZESTRIL) 10 MG tablet Take 1 tablet (10 mg total) by mouth daily. 30 tablet 11  . omeprazole (PRILOSEC) 20 MG capsule TAKE 1 CAPSULE BY MOUTH TWICE DAILY BEFORE A MEAL 180 capsule 1  . oxyCODONE-acetaminophen (PERCOCET) 10-325 MG tablet Take 1 tablet by mouth every 4 (four) hours as needed for pain.    [name] PROAIR HFA 108 (90 Base) MCG/ACT inhaler Inhale 2 puffs into the lungs every 6 (six) hours as needed for wheezing or shortness of breath. 3 Inhaler 1  . TRELEGY ELLIPTA 100-62.5-25 MCG/INH AEPB Inhale 1 puff into the lungs daily.     No current facility-administered medications for this visit.    SURGICAL HISTORY:  Past Surgical History:  Procedure Laterality Date  . APPENDECTOMY    . BACK SURGERY    . CARDIOVERSION  11/27/2011   Procedure: CARDIOVERSION;  Surgeon: Thayer Headings, MD;  Location: [facility];  Service: Cardiovascular;  Laterality: N/A;  . LUNG CANCER SURGERY  2011    REVIEW OF SYSTEMS:  A comprehensive review of systems was negative except for: Constitutional: positive for fatigue Respiratory: positive for dyspnea on exertion   PHYSICAL EXAMINATION: General appearance: alert, cooperative, appears stated age, fatigued and no distress Head: Normocephalic, without obvious abnormality, atraumatic Neck: no adenopathy, no JVD, supple, symmetrical, trachea midline and thyroid not enlarged, symmetric, no  tenderness/mass/nodules Lymph nodes: Cervical, supraclavicular, and axillary nodes normal. Resp: clear to auscultation bilaterally Back: symmetric, no curvature. ROM normal. No CVA tenderness. Cardio: regular rate and rhythm, S1, S2 normal, no murmur, click, rub or gallop GI: soft, non-tender; bowel sounds normal; no masses,  no organomegaly Extremities: extremities normal, atraumatic, no cyanosis or edema  ECOG PERFORMANCE STATUS: 1 - Symptomatic but completely ambulatory  Blood pressure (!) 144/71, pulse 95, temperature 98.5 F (36.9 C), temperature source Temporal, resp. rate 18, height 6' (1.829 m), weight 236 lb 6.4 oz (107.2 kg), SpO2 98 %.  LABORATORY DATA: Lab Results  Component Value Date   WBC 6.6 10/03/2019   HGB 12.7 (L) 10/03/2019   HCT 39.4 10/03/2019   MCV 90.8 10/03/2019   PLT 194.0 10/03/2019      Chemistry      Component Value Date/Time   NA 140 10/03/2019 1236   NA 140 09/07/2017 1322   K 5.0 10/03/2019 1236   K 5.2 No visable hemolysis (H) 09/07/2017 1322   CL 103 10/03/2019 1236   CO2 31 10/03/2019 1236   CO2 33 (H) 09/07/2017 1322   BUN 14 10/03/2019 1236   BUN 13.6 09/07/2017 1322   CREATININE 0.97 10/03/2019 1236   CREATININE 1.08 09/07/2019 0812   CREATININE 1.0 09/07/2017 1322      Component Value Date/Time   CALCIUM 9.6 10/03/2019 1236   CALCIUM 9.1 09/07/2017 1322   ALKPHOS 83 09/07/2019 0812   ALKPHOS 92 09/07/2017 1322   AST 10 (L) 09/07/2019 0812   AST 11 09/07/2017 1322   ALT 8 09/07/2019 0812   ALT 8 09/07/2017 1322   BILITOT 0.5 09/07/2019 0812   BILITOT 0.47 09/07/2017 1322       RADIOGRAPHIC STUDIES: CT Chest W Contrast  Result Date: 09/08/2019 CLINICAL DATA:  Non-Hodgkin's lymphoma diagnosed in 2016 with chemotherapy completed in 2017. Lung cancer in 2011 with partial left lung resection. Asymptomatic. EXAM: CT CHEST, ABDOMEN, AND PELVIS WITH CONTRAST TECHNIQUE: Multidetector CT imaging of the chest, abdomen and pelvis was  performed following the standard protocol during bolus administration of intravenous contrast. CONTRAST:  131mL OMNIPAQUE IOHEXOL 300 MG/ML  SOLN COMPARISON:  09/06/2018 FINDINGS: CT CHEST FINDINGS Cardiovascular: Right Port-A-Cath tip at low SVC. Mild cardiomegaly, without pericardial effusion. Lad coronary artery atherosclerosis. No central pulmonary embolism, on this non-dedicated study. Pulmonary artery enlargement, outflow tract 3.4 cm. Mediastinum/Nodes: No supraclavicular adenopathy. A node within the azygoesophageal recess measures 1.4 cm on 38/2 versus 1.3 cm on the prior. Borderline size right paratracheal node of 1.0 cm on 27/2, 7 mm previously. Lungs/Pleura: Right-sided pleural calcifications. Lower lobe predominant bronchial wall thickening. Presumed secretions within the right upper lobe bronchus and bronchus intermedius. Mild to moderate centrilobular emphysema. Development of a central right upper lobe pulmonary nodule of 2.4 x 1.8 cm on 66/4. A right lower lobe 4 mm nodule on 132/4 is new. Peribronchovascular interstitial thickening at both lung bases. Similar on the left and felt to be slightly progressive on the right. Presumed scarring within the posterolateral right upper lobe.  Surgical sutures in the left upper lobe. 3 mm left lower lobe pulmonary nodule on 114/4, similar. Musculoskeletal: No acute osseous abnormality. CT ABDOMEN PELVIS FINDINGS Hepatobiliary: Normal liver. Normal gallbladder, without biliary ductal dilatation. Pancreas: Normal, without mass or ductal dilatation. Spleen: No splenomegaly. inferior splenic 3.3 cm minimally complex cystic lesion is decreased in size from 3.6 cm previously. Adrenals/Urinary Tract: Normal adrenal glands. Multiple bilateral renal collecting system calculi. Bilateral renal cysts, including a dominant lower pole 8.4 cm lesion. Mild anterior bladder wall thickening with subtle pericystic interstitial thickening. Example 122 and 123/2. Stomach/Bowel:  Normal stomach, without wall thickening. Colonic stool burden suggests constipation. Normal terminal ileum. Normal small bowel. Vascular/Lymphatic: Advanced aortic and branch vessel atherosclerosis. Multiple small abdominal retroperitoneal nodes. None pathologic by size criteria. index aortocaval node measures 8 mm on 77/2 versus 6 mm on the prior. No pelvic sidewall adenopathy. Soft tissue density adjacent to the bladder measures 9 mm on 124/2 versus 7 mm on the prior. Especially when compared back to 08/27/2016, favored to represent a varix. Reproductive: Normal prostate. Other: No significant free fluid. No evidence of omental or peritoneal disease. Musculoskeletal: Left iliac sclerotic lesion is similar and likely a bone island. Lumbosacral spondylosis. IMPRESSION: 1. New central right upper lobe pulmonary nodule with differential considerations of metachronous primary bronchogenic carcinoma or recurrent lymphoma. 2. Slight increase in size of nodes within the chest and abdomen, for which recurrent lymphoma cannot be excluded. Consider PET to direct tissue sampling. 3. Aortic atherosclerosis (ICD10-I70.0), coronary artery atherosclerosis and emphysema (ICD10-J43.9). 4. Pulmonary artery enlargement suggests pulmonary arterial hypertension. 5. Peribronchovascular interstitial thickening is similar at the left lung base and increased at the right lung base. Considerations include chronic atypical infection or aspiration. 6. Bilateral nephrolithiasis. 7. Mild bladder wall thickening and pericystic edema. Correlate with any symptoms of cystitis or bladder outlet obstruction. 8. Right pleural calcifications, suggesting prior hemothorax or empyema. Electronically Signed   By: Abigail Miyamoto M.D.   On: 09/08/2019 08:40   CT Abdomen Pelvis W Contrast  Result Date: 09/08/2019 CLINICAL DATA:  Non-Hodgkin's lymphoma diagnosed in 2016 with chemotherapy completed in 2017. Lung cancer in 2011 with partial left lung  resection. Asymptomatic. EXAM: CT CHEST, ABDOMEN, AND PELVIS WITH CONTRAST TECHNIQUE: Multidetector CT imaging of the chest, abdomen and pelvis was performed following the standard protocol during bolus administration of intravenous contrast. CONTRAST:  129mL OMNIPAQUE IOHEXOL 300 MG/ML  SOLN COMPARISON:  09/06/2018 FINDINGS: CT CHEST FINDINGS Cardiovascular: Right Port-A-Cath tip at low SVC. Mild cardiomegaly, without pericardial effusion. Lad coronary artery atherosclerosis. No central pulmonary embolism, on this non-dedicated study. Pulmonary artery enlargement, outflow tract 3.4 cm. Mediastinum/Nodes: No supraclavicular adenopathy. A node within the azygoesophageal recess measures 1.4 cm on 38/2 versus 1.3 cm on the prior. Borderline size right paratracheal node of 1.0 cm on 27/2, 7 mm previously. Lungs/Pleura: Right-sided pleural calcifications. Lower lobe predominant bronchial wall thickening. Presumed secretions within the right upper lobe bronchus and bronchus intermedius. Mild to moderate centrilobular emphysema. Development of a central right upper lobe pulmonary nodule of 2.4 x 1.8 cm on 66/4. A right lower lobe 4 mm nodule on 132/4 is new. Peribronchovascular interstitial thickening at both lung bases. Similar on the left and felt to be slightly progressive on the right. Presumed scarring within the posterolateral right upper lobe. Surgical sutures in the left upper lobe. 3 mm left lower lobe pulmonary nodule on 114/4, similar. Musculoskeletal: No acute osseous abnormality. CT ABDOMEN PELVIS FINDINGS Hepatobiliary: Normal liver. Normal gallbladder, without biliary  ductal dilatation. Pancreas: Normal, without mass or ductal dilatation. Spleen: No splenomegaly. inferior splenic 3.3 cm minimally complex cystic lesion is decreased in size from 3.6 cm previously. Adrenals/Urinary Tract: Normal adrenal glands. Multiple bilateral renal collecting system calculi. Bilateral renal cysts, including a dominant lower  pole 8.4 cm lesion. Mild anterior bladder wall thickening with subtle pericystic interstitial thickening. Example 122 and 123/2. Stomach/Bowel: Normal stomach, without wall thickening. Colonic stool burden suggests constipation. Normal terminal ileum. Normal small bowel. Vascular/Lymphatic: Advanced aortic and branch vessel atherosclerosis. Multiple small abdominal retroperitoneal nodes. None pathologic by size criteria. index aortocaval node measures 8 mm on 77/2 versus 6 mm on the prior. No pelvic sidewall adenopathy. Soft tissue density adjacent to the bladder measures 9 mm on 124/2 versus 7 mm on the prior. Especially when compared back to 08/27/2016, favored to represent a varix. Reproductive: Normal prostate. Other: No significant free fluid. No evidence of omental or peritoneal disease. Musculoskeletal: Left iliac sclerotic lesion is similar and likely a bone island. Lumbosacral spondylosis. IMPRESSION: 1. New central right upper lobe pulmonary nodule with differential considerations of metachronous primary bronchogenic carcinoma or recurrent lymphoma. 2. Slight increase in size of nodes within the chest and abdomen, for which recurrent lymphoma cannot be excluded. Consider PET to direct tissue sampling. 3. Aortic atherosclerosis (ICD10-I70.0), coronary artery atherosclerosis and emphysema (ICD10-J43.9). 4. Pulmonary artery enlargement suggests pulmonary arterial hypertension. 5. Peribronchovascular interstitial thickening is similar at the left lung base and increased at the right lung base. Considerations include chronic atypical infection or aspiration. 6. Bilateral nephrolithiasis. 7. Mild bladder wall thickening and pericystic edema. Correlate with any symptoms of cystitis or bladder outlet obstruction. 8. Right pleural calcifications, suggesting prior hemothorax or empyema. Electronically Signed   By: Abigail Miyamoto M.D.   On: 09/08/2019 08:40   NM PET Image Restag (PS) Skull Base To Thigh  Result  Date: 09/20/2019 CLINICAL DATA:  Initial treatment strategy for lung mass. History of lung cancer diagnosed 2011. History of Hodgkin's lymphoma diagnosed 2017. EXAM: NUCLEAR MEDICINE PET SKULL BASE TO THIGH TECHNIQUE: 12.3 mCi F-18 FDG was injected intravenously. Full-ring PET imaging was performed from the skull base to thigh after the radiotracer. CT data was obtained and used for attenuation correction and anatomic localization. Fasting blood glucose: 90 mg/dl COMPARISON:  CT 09/06/2018, PET-CT 02/24/2016 FINDINGS: Mediastinal blood pool activity: SUV max 3.0 Liver activity: SUV max 4.2 NECK: No hypermetabolic lymph nodes in the neck. Incidental CT findings: none CHEST: 2.5 x 2.0 cm lobular RIGHT upper lobe nodule has intense metabolic activity with SUV max equal 35.9. No additional hypermetabolic pulmonary nodules. No hypermetabolic mediastinal lymph nodes. No supraclavicular adenopathy.  No axillary adenopathy. Incidental CT findings: Coronary artery calcification and aortic atherosclerotic calcification. ABDOMEN/PELVIS: No abnormal hypermetabolic activity within the liver, pancreas, adrenal glands, or spleen. No hypermetabolic lymph nodes in the abdomen or pelvis. Incidental CT findings: Bilateral benign renal cysts. Atherosclerotic calcification of the aorta. SKELETON: No focal hypermetabolic activity to suggest skeletal metastasis. Incidental CT findings: none IMPRESSION: 1. Hypermetabolic lobular RIGHT upper lobe nodule consistent with malignancy. Differential include metachronous bronchogenic carcinoma versus lymphoma recurrence. Favor bronchogenic carcinoma. 2. No evidence of metastatic lymphadenopathy. Electronically Signed   By: Suzy Bouchard M.D.   On: 09/20/2019 15:49    ASSESSMENT AND PLAN:  This is a very pleasant 69 years old white male with stage IIIa large B-cell non-Hodgkin lymphoma status post 6 cycles of systemic chemotherapy with CHOP/Rituxan with almost complete response. The  patient has been on  observation for several years. Unfortunately his recent CT scan of the chest scan showed development of new central right upper lobe pulmonary nodule suspicious for lung cancer or recurrent lymphoma.  I order a PET scan which was performed recently.  I personally and independently reviewed the scan images and discussed the result and showed the images to the patient. His PET scan showed hypermetabolic right upper lobe nodule consistent with malignancy likely bronchogenic carcinoma. The patient was referred to pulmonary medicine for consideration of bronchoscopy and he is scheduled for navigational bronchoscopy by Dr. Lamonte Sakai on October 12, 2018. I will arrange for the patient to come back for follow-up visit in around 2 weeks from now hopefully with the availability of the tissue biopsy for further recommendation regarding his condition and treatment options. He was advised to call immediately if he has any concerning symptoms in the interval. The patient voices understanding of current disease status and treatment options and is in agreement with the current care plan. All questions were answered. The patient knows to call the clinic with any problems, questions or concerns. We can certainly see the patient much sooner if necessary.  Disclaimer: This note was dictated with voice recognition software. Similar sounding words can inadvertently be transcribed and may not be corrected upon review.

## 2019-10-05 ENCOUNTER — Telehealth: Payer: Self-pay | Admitting: Internal Medicine

## 2019-10-05 ENCOUNTER — Ambulatory Visit (INDEPENDENT_AMBULATORY_CARE_PROVIDER_SITE_OTHER)
Admission: RE | Admit: 2019-10-05 | Discharge: 2019-10-05 | Disposition: A | Discharge status: Home / Self Care | Payer: PPO | Source: Ambulatory Visit | Attending: Emergency Medicine | Admitting: Emergency Medicine

## 2019-10-05 DIAGNOSIS — E7800 Pure hypercholesterolemia, unspecified: Secondary | ICD-10-CM | POA: Diagnosis not present

## 2019-10-05 DIAGNOSIS — E611 Iron deficiency: Secondary | ICD-10-CM | POA: Diagnosis not present

## 2019-10-05 DIAGNOSIS — R911 Solitary pulmonary nodule: Secondary | ICD-10-CM | POA: Diagnosis not present

## 2019-10-05 DIAGNOSIS — G8929 Other chronic pain: Secondary | ICD-10-CM | POA: Diagnosis not present

## 2019-10-05 DIAGNOSIS — Z1159 Encounter for screening for other viral diseases: Secondary | ICD-10-CM | POA: Diagnosis not present

## 2019-10-05 DIAGNOSIS — Z79899 Other long term (current) drug therapy: Secondary | ICD-10-CM | POA: Diagnosis not present

## 2019-10-05 DIAGNOSIS — R5383 Other fatigue: Secondary | ICD-10-CM | POA: Diagnosis not present

## 2019-10-05 DIAGNOSIS — J439 Emphysema, unspecified: Secondary | ICD-10-CM | POA: Diagnosis not present

## 2019-10-05 DIAGNOSIS — R918 Other nonspecific abnormal finding of lung field: Secondary | ICD-10-CM | POA: Diagnosis not present

## 2019-10-05 DIAGNOSIS — M5416 Radiculopathy, lumbar region: Secondary | ICD-10-CM | POA: Diagnosis not present

## 2019-10-05 DIAGNOSIS — M129 Arthropathy, unspecified: Secondary | ICD-10-CM | POA: Diagnosis not present

## 2019-10-05 DIAGNOSIS — E559 Vitamin D deficiency, unspecified: Secondary | ICD-10-CM | POA: Diagnosis not present

## 2019-10-05 NOTE — Telephone Encounter (Signed)
Scheduled per los. Called and spoke with patient. Confirmed appt 

## 2019-10-06 ENCOUNTER — Other Ambulatory Visit: Payer: PPO

## 2019-10-09 ENCOUNTER — Other Ambulatory Visit (HOSPITAL_COMMUNITY): Payer: PPO

## 2019-10-10 ENCOUNTER — Other Ambulatory Visit (HOSPITAL_COMMUNITY)
Admission: RE | Admit: 2019-10-10 | Discharge: 2019-10-10 | Disposition: A | Discharge status: Home / Self Care | Payer: PPO | Source: Ambulatory Visit | Attending: Emergency Medicine | Admitting: Emergency Medicine

## 2019-10-10 DIAGNOSIS — Z20822 Contact with and (suspected) exposure to covid-19: Secondary | ICD-10-CM | POA: Diagnosis not present

## 2019-10-10 DIAGNOSIS — Z01812 Encounter for preprocedural laboratory examination: Secondary | ICD-10-CM | POA: Insufficient documentation

## 2019-10-11 LAB — NOVEL CORONAVIRUS, NAA (HOSP ORDER, SEND-OUT TO REF LAB; TAT 18-24 HRS): SARS-CoV-2, NAA: NOT DETECTED

## 2019-10-12 ENCOUNTER — Telehealth: Payer: Self-pay | Admitting: Emergency Medicine

## 2019-10-12 ENCOUNTER — Encounter (HOSPITAL_COMMUNITY): Payer: Self-pay | Admitting: Emergency Medicine

## 2019-10-12 ENCOUNTER — Other Ambulatory Visit: Payer: Self-pay

## 2019-10-12 NOTE — Telephone Encounter (Signed)
Called and spoke to pt. Pt questioning when to get to hospital tomorrow 1/22 for his bronch. Advised pt to arrive at least an hour before the scheduled procedure. Pt verbalized understanding and denied any further questions or concerns at this time.

## 2019-10-12 NOTE — Anesthesia Preprocedure Evaluation (Addendum)
Anesthesia Evaluation  Patient identified by MRN, date of birth, ID band Patient awake    Reviewed: Allergy & Precautions, NPO status , Patient's Chart, lab work & pertinent test results  History of Anesthesia Complications Negative for: history of anesthetic complications  Airway Mallampati: IV  TM Distance: >3 FB Neck ROM: Full    Dental  (+) Chipped, Dental Advisory Given   Pulmonary COPD,  COPD inhaler, former smoker (quit 2003),  10/10/2019 SARRS CoV2 NEG H/o lung Ca   breath sounds clear to auscultation       Cardiovascular hypertension, Pt. on medications (-) angina+ dysrhythmias Atrial Fibrillation  Rhythm:Irregular Rate:Normal  '17 ECHO: EF 60-65%, valves OK   Neuro/Psych negative neurological ROS     GI/Hepatic Neg liver ROS, GERD  Medicated and Controlled,  Endo/Other  Hypothyroidism   Renal/GU negative Renal ROS     Musculoskeletal  (+) Arthritis ,   Abdominal   Peds  Hematology Non-Hodgkin's lymphoma   Anesthesia Other Findings   Reproductive/Obstetrics                           Anesthesia Physical Anesthesia Plan  ASA: III  Anesthesia Plan: General   Post-op Pain Management:    Induction: Intravenous  PONV Risk Score and Plan: 2 and Ondansetron and Dexamethasone  Airway Management Planned: Oral ETT  Additional Equipment: None  Intra-op Plan:   Post-operative Plan: Extubation in OR  Informed Consent: I have reviewed the patients History and Physical, chart, labs and discussed the procedure including the risks, benefits and alternatives for the proposed anesthesia with the patient or authorized representative who has indicated his/her understanding and acceptance.     Dental advisory given  Plan Discussed with: CRNA and Surgeon  Anesthesia Plan Comments: (Follows with oncology for history of stage IIIa large B-cell non-Hodgkin lymphoma status post 6 cycles of  systemic chemotherapy with CHOP/Rituxan with almost complete response. The patient has been on observation for several years. Recent CT scan of the chest scan showed development of new central right upper lobe pulmonary nodule suspicious for lung cancer or recurrent lymphoma.    Spirometry 10/28/18: FVC 53%  FEV1 45% FEV1/FVC 85%  TTE 09/30/15: - Left ventricle: The cavity size was normal. Wall thickness was   increased in a pattern of mild LVH. Systolic function was normal.   The estimated ejection fraction was in the range of 60% to 65%.   Doppler parameters are consistent with abnormal left ventricular   relaxation (grade 1 diastolic dysfunction).)   Anesthesia Quick Evaluation

## 2019-10-12 NOTE — Progress Notes (Signed)
Patient denies shortness of breath, fever, cough and chest pain.  PCP - Hubbard Robinson PA-C Cardiologist - denies  Chest x-ray - denies EKG - 10/13/19 Stress Test -  ECHO - 09/30/15 Cardiac Cath - denies  Anesthesia review: Yes  STOP now taking any Aspirin (unless otherwise instructed by your surgeon), Aleve, Naproxen, Ibuprofen, Motrin, Advil, Goody's, BC's, all herbal medications, fish oil, and all vitamins.   Coronavirus Screening Covid test on 10/10/19 was negative.  Patient verbalized understanding of instructions that were given via phone.

## 2019-10-13 ENCOUNTER — Encounter (HOSPITAL_COMMUNITY): Payer: Self-pay | Admitting: Emergency Medicine

## 2019-10-13 ENCOUNTER — Ambulatory Visit (HOSPITAL_COMMUNITY)
Admission: RE | Admit: 2019-10-13 | Discharge: 2019-10-13 | Disposition: A | Discharge status: Home / Self Care | Payer: PPO | Attending: Emergency Medicine | Admitting: Emergency Medicine

## 2019-10-13 ENCOUNTER — Encounter (HOSPITAL_COMMUNITY)
Admission: RE | Disposition: A | Discharge status: Home / Self Care | Payer: Self-pay | Source: Home / Self Care | Attending: Emergency Medicine

## 2019-10-13 ENCOUNTER — Ambulatory Visit (HOSPITAL_COMMUNITY): Payer: PPO | Admitting: Physician Assistant

## 2019-10-13 ENCOUNTER — Ambulatory Visit (HOSPITAL_COMMUNITY): Payer: PPO

## 2019-10-13 DIAGNOSIS — E669 Obesity, unspecified: Secondary | ICD-10-CM | POA: Diagnosis not present

## 2019-10-13 DIAGNOSIS — Z8572 Personal history of non-Hodgkin lymphomas: Secondary | ICD-10-CM | POA: Insufficient documentation

## 2019-10-13 DIAGNOSIS — I509 Heart failure, unspecified: Secondary | ICD-10-CM | POA: Insufficient documentation

## 2019-10-13 DIAGNOSIS — N400 Enlarged prostate without lower urinary tract symptoms: Secondary | ICD-10-CM | POA: Insufficient documentation

## 2019-10-13 DIAGNOSIS — K219 Gastro-esophageal reflux disease without esophagitis: Secondary | ICD-10-CM | POA: Insufficient documentation

## 2019-10-13 DIAGNOSIS — Z79899 Other long term (current) drug therapy: Secondary | ICD-10-CM | POA: Diagnosis not present

## 2019-10-13 DIAGNOSIS — E039 Hypothyroidism, unspecified: Secondary | ICD-10-CM | POA: Diagnosis not present

## 2019-10-13 DIAGNOSIS — R590 Localized enlarged lymph nodes: Secondary | ICD-10-CM | POA: Insufficient documentation

## 2019-10-13 DIAGNOSIS — I10 Essential (primary) hypertension: Secondary | ICD-10-CM | POA: Diagnosis not present

## 2019-10-13 DIAGNOSIS — I110 Hypertensive heart disease with heart failure: Secondary | ICD-10-CM | POA: Diagnosis not present

## 2019-10-13 DIAGNOSIS — Z85118 Personal history of other malignant neoplasm of bronchus and lung: Secondary | ICD-10-CM | POA: Diagnosis not present

## 2019-10-13 DIAGNOSIS — Z87891 Personal history of nicotine dependence: Secondary | ICD-10-CM | POA: Diagnosis not present

## 2019-10-13 DIAGNOSIS — M109 Gout, unspecified: Secondary | ICD-10-CM | POA: Diagnosis not present

## 2019-10-13 DIAGNOSIS — C3411 Malignant neoplasm of upper lobe, right bronchus or lung: Secondary | ICD-10-CM | POA: Insufficient documentation

## 2019-10-13 DIAGNOSIS — J449 Chronic obstructive pulmonary disease, unspecified: Secondary | ICD-10-CM | POA: Insufficient documentation

## 2019-10-13 DIAGNOSIS — I272 Pulmonary hypertension, unspecified: Secondary | ICD-10-CM | POA: Insufficient documentation

## 2019-10-13 DIAGNOSIS — R918 Other nonspecific abnormal finding of lung field: Secondary | ICD-10-CM | POA: Diagnosis not present

## 2019-10-13 DIAGNOSIS — R911 Solitary pulmonary nodule: Secondary | ICD-10-CM

## 2019-10-13 DIAGNOSIS — Z9221 Personal history of antineoplastic chemotherapy: Secondary | ICD-10-CM | POA: Insufficient documentation

## 2019-10-13 DIAGNOSIS — C3491 Malignant neoplasm of unspecified part of right bronchus or lung: Secondary | ICD-10-CM | POA: Diagnosis not present

## 2019-10-13 DIAGNOSIS — Z9889 Other specified postprocedural states: Secondary | ICD-10-CM

## 2019-10-13 DIAGNOSIS — Z79890 Hormone replacement therapy (postmenopausal): Secondary | ICD-10-CM | POA: Diagnosis not present

## 2019-10-13 DIAGNOSIS — Z20822 Contact with and (suspected) exposure to covid-19: Secondary | ICD-10-CM | POA: Diagnosis not present

## 2019-10-13 DIAGNOSIS — Z419 Encounter for procedure for purposes other than remedying health state, unspecified: Secondary | ICD-10-CM

## 2019-10-13 DIAGNOSIS — Z6831 Body mass index (BMI) 31.0-31.9, adult: Secondary | ICD-10-CM | POA: Diagnosis not present

## 2019-10-13 HISTORY — DX: Other specified postprocedural states: Z98.890

## 2019-10-13 HISTORY — PX: FUDUCIAL PLACEMENT: SHX5083

## 2019-10-13 HISTORY — DX: Gastro-esophageal reflux disease without esophagitis: K21.9

## 2019-10-13 HISTORY — DX: Unspecified osteoarthritis, unspecified site: M19.90

## 2019-10-13 HISTORY — PX: VIDEO BRONCHOSCOPY WITH ENDOBRONCHIAL ULTRASOUND: SHX6177

## 2019-10-13 HISTORY — PX: VIDEO BRONCHOSCOPY WITH ENDOBRONCHIAL NAVIGATION: SHX6175

## 2019-10-13 SURGERY — BRONCHOSCOPY, WITH EBUS
Anesthesia: General | Site: Chest | Laterality: Right

## 2019-10-13 MED ORDER — ONDANSETRON HCL 4 MG/2ML IJ SOLN
INTRAMUSCULAR | Status: DC | PRN
  Administered 2019-10-13: 4 mg via INTRAVENOUS

## 2019-10-13 MED ORDER — SUGAMMADEX SODIUM 200 MG/2ML IV SOLN
INTRAVENOUS | Status: DC | PRN
  Administered 2019-10-13: 200 mg via INTRAVENOUS

## 2019-10-13 MED ORDER — PROPOFOL 10 MG/ML IV BOLUS
INTRAVENOUS | Status: DC | PRN
  Administered 2019-10-13: 110 mg via INTRAVENOUS

## 2019-10-13 MED ORDER — FENTANYL CITRATE (PF) 250 MCG/5ML IJ SOLN
INTRAMUSCULAR | Status: AC
  Filled 2019-10-13: qty 5

## 2019-10-13 MED ORDER — PHENYLEPHRINE HCL (PRESSORS) 10 MG/ML IV SOLN
INTRAVENOUS | Status: DC | PRN
  Administered 2019-10-13: 80 ug via INTRAVENOUS
  Administered 2019-10-13: 120 ug via INTRAVENOUS
  Administered 2019-10-13: 160 ug via INTRAVENOUS

## 2019-10-13 MED ORDER — LACTATED RINGERS IV SOLN: INTRAVENOUS | Status: DC | PRN

## 2019-10-13 MED ORDER — FENTANYL CITRATE (PF) 100 MCG/2ML IJ SOLN
INTRAMUSCULAR | Status: DC | PRN
  Administered 2019-10-13 (×2): 50 ug via INTRAVENOUS
  Administered 2019-10-13: 100 ug via INTRAVENOUS

## 2019-10-13 MED ORDER — PHENYLEPHRINE HCL-NACL 10-0.9 MG/250ML-% IV SOLN
INTRAVENOUS | Status: DC | PRN
  Administered 2019-10-13: 30 ug/min via INTRAVENOUS

## 2019-10-13 MED ORDER — LIDOCAINE HCL (CARDIAC) PF 100 MG/5ML IV SOSY
PREFILLED_SYRINGE | INTRAVENOUS | Status: DC | PRN
  Administered 2019-10-13: 30 mg via INTRAVENOUS

## 2019-10-13 MED ORDER — 0.9 % SODIUM CHLORIDE (POUR BTL) OPTIME
TOPICAL | Status: DC | PRN
  Administered 2019-10-13: 1000 mL

## 2019-10-13 MED ORDER — DEXAMETHASONE SODIUM PHOSPHATE 10 MG/ML IJ SOLN
INTRAMUSCULAR | Status: DC | PRN
  Administered 2019-10-13: 10 mg via INTRAVENOUS

## 2019-10-13 MED ORDER — ROCURONIUM BROMIDE 50 MG/5ML IV SOSY
PREFILLED_SYRINGE | INTRAVENOUS | Status: DC | PRN
  Administered 2019-10-13: 60 mg via INTRAVENOUS
  Administered 2019-10-13: 20 mg via INTRAVENOUS

## 2019-10-13 MED ORDER — OMEPRAZOLE 20 MG PO CPDR: 20.0000 mg | DELAYED_RELEASE_CAPSULE | Freq: Two times a day (BID) | ORAL | Status: DC

## 2019-10-13 MED ORDER — LACTATED RINGERS IV SOLN: INTRAVENOUS | Status: DC

## 2019-10-13 MED ORDER — MIDAZOLAM HCL 5 MG/5ML IJ SOLN
INTRAMUSCULAR | Status: DC | PRN
  Administered 2019-10-13: 2 mg via INTRAVENOUS

## 2019-10-13 MED ORDER — MIDAZOLAM HCL 2 MG/2ML IJ SOLN
INTRAMUSCULAR | Status: AC
  Filled 2019-10-13: qty 2

## 2019-10-13 SURGICAL SUPPLY — 48 items
ADAPTER BRONCHOSCOPE OLYMPUS (ADAPTER) ×3 IMPLANT
ADAPTER VALVE BIOPSY EBUS (MISCELLANEOUS) IMPLANT
ADPR BSCP OLMPS EDG (ADAPTER) ×2
ADPTR VALVE BIOPSY EBUS (MISCELLANEOUS)
BRUSH CYTOL CELLEBRITY 1.5X140 (MISCELLANEOUS) ×3 IMPLANT
BRUSH SUPERTRAX BIOPSY (INSTRUMENTS) IMPLANT
BRUSH SUPERTRAX NDL-TIP CYTO (INSTRUMENTS) ×3 IMPLANT
CANISTER SUCT 3000ML PPV (MISCELLANEOUS) ×3 IMPLANT
CONT SPEC 4OZ CLIKSEAL STRL BL (MISCELLANEOUS) ×3 IMPLANT
COVER BACK TABLE 60X90IN (DRAPES) ×3 IMPLANT
COVER WAND RF STERILE (DRAPES) ×3 IMPLANT
FILTER STRAW FLUID ASPIR (MISCELLANEOUS) IMPLANT
FORCEPS BIOP RJ4 1.8 (CUTTING FORCEPS) IMPLANT
FORCEPS BIOP SUPERTRX PREMAR (INSTRUMENTS) ×3 IMPLANT
GAUZE SPONGE 4X4 12PLY STRL (GAUZE/BANDAGES/DRESSINGS) ×3 IMPLANT
GLOVE BIO SURGEON STRL SZ7.5 (GLOVE) ×6 IMPLANT
GOWN STRL REUS W/ TWL LRG LVL3 (GOWN DISPOSABLE) ×4 IMPLANT
GOWN STRL REUS W/TWL LRG LVL3 (GOWN DISPOSABLE) ×6
KIT CLEAN ENDO COMPLIANCE (KITS) ×6 IMPLANT
KIT ILLUMISITE 180 PROCEDURE (KITS) ×1 IMPLANT
KIT ILLUMISITE 90 PROCEDURE (KITS) IMPLANT
KIT LOCATABLE GUIDE (CANNULA) IMPLANT
KIT MARKER FIDUCIAL DELIVERY (KITS) ×1 IMPLANT
KIT TURNOVER KIT B (KITS) ×3 IMPLANT
MARKER FIDUCIAL SL NIT COIL (Implant Marker) ×3 IMPLANT
MARKER SKIN DUAL TIP RULER LAB (MISCELLANEOUS) ×3 IMPLANT
NDL ASPIRATION VIZISHOT 19G (NEEDLE) IMPLANT
NDL ASPIRATION VIZISHOT 21G (NEEDLE) IMPLANT
NDL SUPERTRX PREMARK BIOPSY (NEEDLE) ×2 IMPLANT
NEEDLE ASPIRATION VIZISHOT 19G (NEEDLE) ×6 IMPLANT
NEEDLE ASPIRATION VIZISHOT 21G (NEEDLE) IMPLANT
NEEDLE SUPERTRX PREMARK BIOPSY (NEEDLE) ×3 IMPLANT
NS IRRIG 1000ML POUR BTL (IV SOLUTION) ×3 IMPLANT
OIL SILICONE PENTAX (PARTS (SERVICE/REPAIRS)) ×3 IMPLANT
PAD ARMBOARD 7.5X6 YLW CONV (MISCELLANEOUS) ×6 IMPLANT
PATCHES PATIENT (LABEL) ×9 IMPLANT
SYR 20ML ECCENTRIC (SYRINGE) ×6 IMPLANT
SYR 20ML LL LF (SYRINGE) ×6 IMPLANT
SYR 50ML SLIP (SYRINGE) ×3 IMPLANT
SYR 5ML LUER SLIP (SYRINGE) ×3 IMPLANT
TOWEL GREEN STERILE FF (TOWEL DISPOSABLE) ×3 IMPLANT
TRAP SPECIMEN MUCOUS 40CC (MISCELLANEOUS) IMPLANT
TUBE CONNECTING 20X1/4 (TUBING) ×6 IMPLANT
UNDERPAD 30X30 (UNDERPADS AND DIAPERS) ×3 IMPLANT
VALVE BIOPSY  SINGLE USE (MISCELLANEOUS) ×1
VALVE BIOPSY SINGLE USE (MISCELLANEOUS) ×2 IMPLANT
VALVE SUCTION BRONCHIO DISP (MISCELLANEOUS) ×3 IMPLANT
WATER STERILE IRR 1000ML POUR (IV SOLUTION) ×3 IMPLANT

## 2019-10-13 NOTE — Progress Notes (Signed)
EKG completed on 10/13/19 showed A. Fib.  Patient on monitor and HR is 70-80's.  Dr. Glennon Mac aware.   Patient states "years ago I had an irregular heartbeat but I had a procedure and it went back to a regular heart beat."    Will continue to monitor patient.

## 2019-10-13 NOTE — Anesthesia Postprocedure Evaluation (Signed)
Anesthesia Post Note  Patient: [name]  Procedure(s) Performed: VIDEO BRONCHOSCOPY WITH ENDOBRONCHIAL ULTRASOUND (N/A Chest) VIDEO BRONCHOSCOPY WITH ENDOBRONCHIAL NAVIGATION (N/A Chest) Placement Of Fuducial (Right Chest)     Patient location during evaluation: PACU Anesthesia Type: General Level of consciousness: awake and alert, patient cooperative and oriented Pain management: pain level controlled Vital Signs Assessment: post-procedure vital signs reviewed and stable Respiratory status: spontaneous breathing, nonlabored ventilation and respiratory function stable Cardiovascular status: blood pressure returned to baseline and stable Postop Assessment: no apparent nausea or vomiting Anesthetic complications: no    Last Vitals:  Vitals:   10/13/19 1410 10/13/19 1415  BP: 136/77   Pulse: 86   Resp: 20   Temp:  (!) 36.3 C  SpO2: 94%     Last Pain:  Vitals:   10/13/19 1415  PainSc: 0-No pain                 [name],E. Erique Kaser

## 2019-10-13 NOTE — Op Note (Signed)
Video Bronchoscopy with Electromagnetic Navigation and Endobronchial Ultrasound Procedure Note  Date of Operation: 10/13/2019  Pre-op Diagnosis: Right upper lobe nodule, mediastinal adenopathy  Post-op Diagnosis: Right upper lobe nodule, mediastinal adenopathy  Surgeon: Baltazar Apo  Assistants: None  Anesthesia: General endotracheal anesthesia  Operation: Flexible video fiberoptic bronchoscopy with electromagnetic navigation and biopsies.  Estimated Blood Loss: 20 cc  Complications: None apparent  Indications and History: [name] is a 69 y.o. male with history tobacco use, prior squamous cell lung cancer and also non-Hodgkin's lymphoma.  He was found on surveillance imaging to have a new right upper lobe pulmonary nodule with some mediastinal adenopathy.  Recommendation was made to achieve a tissue diagnosis via navigational bronchoscopy and endobronchial ultrasound with biopsies.  The risks, benefits, complications, treatment options and expected outcomes were discussed with the patient.  The possibilities of pneumothorax, pneumonia, reaction to medication, pulmonary aspiration, perforation of a viscus, bleeding, failure to diagnose a condition and creating a complication requiring transfusion or operation were discussed with the patient who freely signed the consent.    Description of Procedure: The patient was seen in the Preoperative Area, was examined and was deemed appropriate to proceed.  The patient was taken to the OR 10, identified as [name] and the procedure verified as Flexible Video Fiberoptic Bronchoscopy.  A Time Out was held and the above information confirmed.   Prior to the date of the procedure a high-resolution CT scan of the chest was performed. Utilizing [place] a virtual tracheobronchial tree was generated to allow the creation of distinct navigation pathways to the patient's parenchymal abnormality. After being taken to the operating  room general anesthesia was initiated and the patient  was orally intubated. The video fiberoptic bronchoscope was introduced via the endotracheal tube and a general inspection was performed which showed normal airways throughout.  There were some moderate tan secretions in all airways that were easily suctioned.  No endobronchial lesion was seen. The extendable working channel and locator guide were introduced into the bronchoscope. The distinct navigation pathways prepared prior to this procedure were then utilized to navigate to within 0.5 cm of patient's lesion identified on CT scan. The extendable working channel was secured into place and the locator guide was withdrawn. Under fluoroscopic guidance transbronchial needle brushings, transbronchial Wang needle biopsies, and transbronchial forceps biopsies were performed to be sent for cytology and pathology.  3 fiducial markers were placed triangulating the nodule to facilitate radiation therapy should it become indicated.   The standard scope was then withdrawn and the endobronchial ultrasound was used to identify and characterize the peritracheal, hilar and bronchial lymph nodes. Inspection showed enlargement of 4L node, 7 node.  A 4R node was also identified, appeared to be normal in size. Using real-time ultrasound guidance Wang needle biopsies were attempted from station 4L but the needle could not reach the lesion.  Was able to achieve a biopsy from the enlarged station 7 node which was sent for cytology.  The patient tolerated the procedure well without apparent complications. There was no significant blood loss. The bronchoscope was withdrawn. Anesthesia was reversed and the patient was taken to the PACU for recovery. A post-procedural chest x-ray is pending.  Samples: 1. Transbronchial needle brushings from right upper lobe nodule 2. Transbronchial Wang needle biopsies from right upper lobe nodule 3. Transbronchial forceps biopsies from right  upper lobe nodule 4.  Wang needle biopsy from station 7 lymph node  Plans:  The patient will be  discharged from the PACU to home when recovered from anesthesia. We will review the cytology, pathology results with the patient when they become available. Outpatient followup will be with Dr. Lamonte [name] and Dr. Julien Nordmann.    Baltazar Apo, MD, PhD 10/13/2019, 1:36 PM [name] [phone] or if no answer [phone]

## 2019-10-13 NOTE — Anesthesia Procedure Notes (Signed)
Procedure Name: Intubation Date/Time: 10/13/2019 12:12 PM Performed by: Eligha Bridegroom, CRNA Pre-anesthesia Checklist: Patient identified, Emergency Drugs available, Suction available, Patient being monitored and Timeout performed Patient Re-evaluated:Patient Re-evaluated prior to induction Oxygen Delivery Method: Circle system utilized Preoxygenation: Pre-oxygenation with 100% oxygen Induction Type: IV induction Ventilation: Mask ventilation without difficulty and Oral airway inserted - appropriate to patient size Laryngoscope Size: Mac and 4 Grade View: Grade III Tube type: Oral Tube size: 8.5 mm Placement Confirmation: ETT inserted through vocal cords under direct vision and positive ETCO2 Secured at: 23 cm Tube secured with: Tape Dental Injury: Teeth and Oropharynx as per pre-operative assessment

## 2019-10-13 NOTE — Transfer of Care (Signed)
Immediate Anesthesia Transfer of Care Note  Patient: [name]  Procedure(s) Performed: VIDEO BRONCHOSCOPY WITH ENDOBRONCHIAL ULTRASOUND (N/A Chest) VIDEO BRONCHOSCOPY WITH ENDOBRONCHIAL NAVIGATION (N/A Chest) Placement Of Fuducial (Right Chest)  Patient Location: PACU  Anesthesia Type:General  Level of Consciousness: awake, alert  and oriented  Airway & Oxygen Therapy: Patient Spontanous Breathing and Patient connected to nasal cannula oxygen  Post-op Assessment: Report given to RN and Post -op Vital signs reviewed and stable  Post vital signs: Reviewed and stable  Last Vitals:  Vitals Value Taken Time  BP 133/79 10/13/19 1355  Temp    Pulse 84 10/13/19 1400  Resp 15 10/13/19 1400  SpO2 96 % 10/13/19 1400  Vitals shown include unvalidated device data.  Last Pain:  Vitals:   10/13/19 1006  PainSc: 0-No pain      Patients Stated Pain Goal: 3 (92/33/00 7622)  Complications: No apparent anesthesia complications

## 2019-10-13 NOTE — Interval H&P Note (Signed)
PCCM Interval Note  Mr. Sia presents for evaluation of right upper lobe lobulated pulmonary nodule.  He has a history of tobacco use, left upper lobectomy for squamous cell lung cancer (2011), hx stage III large B cell non-Hodgkin's lymphoma treated with systemic chemotherapy.  He is followed by Dr. Julien Nordmann.  PET scan 12/30 showed that his right upper lobe nodule is hypermetabolic, his mediastinal and perihilar adenopathy is stable in size and was not hypermetabolic on PET.  No new issues reported.  He has stable dyspnea and cough.  No new medications have been added.  He is not on anticoagulation.  Vitals:   10/12/19 1435 10/13/19 0913  BP:  (!) 160/78  Pulse:  97  Resp:  20  Temp:  98.6 F (37 C)  Weight: 106.6 kg 106.6 kg  Height: 6' (1.829 m) 6' (1.829 m)  Comfortable gentleman, lying in bed in no distress.  Oropharynx is clear with an every 3-4 airway.  Lungs clear bilaterally, right middle lobe expiratory rhonchi that clears with deep inspiration.  Heart regular without a murmur.  Abdomen is soft, nondistended with positive bowel sounds.  No significant lower extremity edema.  He is awake, alert, well-oriented with a nonfocal neurological exam.  Lobulated right upper lobe nodule in a patient with a history of tobacco use, prior squamous cell lung cancer and stage III B-cell non-Hodgkin's lymphoma. Advanced COPD  Plan: Plan for navigational bronchoscopy to achieve tissue diagnosis on the right upper lobe nodule.  We will also perform endobronchial ultrasound to characterize the mediastinal nodes, sample them if enlarged.  COPD and overall lung function likely preclude his candidacy for another primary resection.  I will place fiducial markers around the right upper lobe nodule to facilitate stereotactic radiation should it become indicated going forward.  Procedure discussed with patient including all risks and benefits.  He understands.  All questions answered.  No barriers  identified.  He elects to proceed.  Baltazar Apo, MD, PhD 10/13/2019, 10:52 AM [facility] [phone] or if no answer [phone]

## 2019-10-13 NOTE — Discharge Instructions (Signed)
Flexible Bronchoscopy, Care After This sheet gives you information about how to care for yourself after your test. Your doctor may also give you more specific instructions. If you have problems or questions, contact your doctor. Follow these instructions at home: Eating and drinking  Do not eat or drink anything (not even water) for 2 hours after your test, or until your numbing medicine (local anesthetic) wears off.  When your numbness is gone and your cough and gag reflexes have come back, you may: ? Eat only soft foods. ? Slowly drink liquids.  The day after the test, go back to your normal diet. Driving  Do not drive for 24 hours if you were given a medicine to help you relax (sedative).  Do not drive or use heavy machinery while taking prescription pain medicine. General instructions   Take over-the-counter and prescription medicines only as told by your doctor.  Return to your normal activities as told. Ask what activities are safe for you.  Do not use any products that have nicotine or tobacco in them. This includes cigarettes and e-cigarettes. If you need help quitting, ask your doctor.  Keep all follow-up visits as told by your doctor. This is important. It is very important if you had a tissue sample (biopsy) taken. Get help right away if:  You have shortness of breath that gets worse.  You get light-headed.  You feel like you are going to pass out (faint).  You have chest pain.  You cough up: ? More than a little blood. ? More blood than before. Summary  Do not eat or drink anything (not even water) for 2 hours after your test, or until your numbing medicine wears off.  Do not use cigarettes. Do not use e-cigarettes.  Get help right away if you have chest pain.   Please call our office for any questions or concerns.  [phone].  This information is not intended to replace advice given to you by your health care provider. Make sure you discuss any  questions you have with your health care provider. Document Revised: 08/20/2017 Document Reviewed: 09/25/2016 Elsevier Patient Education  2020 [employer].

## 2019-10-16 LAB — CYTOLOGY - NON PAP

## 2019-10-16 LAB — SURGICAL PATHOLOGY

## 2019-10-17 ENCOUNTER — Encounter: Payer: Self-pay | Admitting: *Deleted

## 2019-10-18 ENCOUNTER — Telehealth: Payer: Self-pay | Admitting: Emergency Medicine

## 2019-10-18 NOTE — Telephone Encounter (Signed)
I agree based on his PFT. I did place fiducials if needed.

## 2019-10-18 NOTE — Telephone Encounter (Signed)
Discussed RUL bx and cytology results w [name].  All show squamous cell lung cancer.  Hopefully that will be enough sample available for PDL-1 testing. His 4L node was inaccessible, I was able to sample the 7 node which was benign.  He has follow-up with Dr. Julien Nordmann tomorrow 1/28 to discuss appropriate therapy.

## 2019-10-18 NOTE — Telephone Encounter (Signed)
Rob, Thank you for doing the procedure.  My impression is that he probably will not be a good surgical candidate.  Do you agree?

## 2019-10-18 NOTE — Telephone Encounter (Signed)
Ok. Thank you.

## 2019-10-19 ENCOUNTER — Encounter: Payer: Self-pay | Admitting: Internal Medicine

## 2019-10-19 ENCOUNTER — Other Ambulatory Visit: Payer: Self-pay

## 2019-10-19 ENCOUNTER — Inpatient Hospital Stay (HOSPITAL_BASED_OUTPATIENT_CLINIC_OR_DEPARTMENT_OTHER): Payer: PPO | Admitting: Internal Medicine

## 2019-10-19 VITALS — BP 137/66 | HR 63 | Temp 97.0°F | Resp 18 | Ht 72.0 in | Wt 240.9 lb

## 2019-10-19 DIAGNOSIS — J449 Chronic obstructive pulmonary disease, unspecified: Secondary | ICD-10-CM

## 2019-10-19 DIAGNOSIS — C3411 Malignant neoplasm of upper lobe, right bronchus or lung: Secondary | ICD-10-CM | POA: Diagnosis not present

## 2019-10-19 DIAGNOSIS — C8258 Diffuse follicle center lymphoma, lymph nodes of multiple sites: Secondary | ICD-10-CM | POA: Diagnosis not present

## 2019-10-19 DIAGNOSIS — C3490 Malignant neoplasm of unspecified part of unspecified bronchus or lung: Secondary | ICD-10-CM | POA: Diagnosis not present

## 2019-10-19 NOTE — Progress Notes (Signed)
[facility] [phone]   [phone]  OFFICE PROGRESS NOTE  [name], [place] [address] [place] [address]  DIAGNOSIS:  1) stage IA (T1c, N0, M0) non-small cell lung cancer, squamous cell carcinoma diagnosed in January 2021 2) Stage III large B-cell non-Hodgkin lymphoma diagnosed in December 2016 and presented with extensive lymphadenopathy involving the neck, chest, abdomen as well as splenomegaly.   PRIOR THERAPY: Systemic chemotherapy with the CHOP/Rituxan every 3 weeks with Neulasta support. Status post 6 cycles.  CURRENT THERAPY: Observation.   INTERVAL HISTORY: [name] 69 y.o. male returns to the clinic today for follow-up visit.  The patient is feeling fine today with no concerning complaints except for the baseline shortness of breath increased with exertion.  He denied having any current chest pain but has mild cough and wheezing with no hemoptysis.  He denied having any fever or chills.  He has no nausea, vomiting, diarrhea or constipation.  He has no recent weight loss or night sweats.  The patient recently underwent bronchoscopy with electromagnetic navigation bronchoscopy under the care of Dr. Lamonte Sakai on October 13, 2019 and the final pathology was consistent with squamous cell carcinoma.  He is here today for evaluation and discussion of his treatment options.  MEDICAL HISTORY: Past Medical History:  Diagnosis Date   Arthritis    BPH (benign prostatic hyperplasia) 2011   Congestive heart failure (HCC)    COPD (chronic obstructive pulmonary disease) (HCC)    Depression    Drug-induced neutropenia (HCC) 10/08/2015   Encounter for antineoplastic chemotherapy 10/08/2015   Gastrointestinal obstruction (HCC)    Ileus   GERD (gastroesophageal reflux disease)    Gout    Hx of colonoscopy    Hypertension    Hypothyroid    Lung cancer ([facility]) 2011   NHL (non-Hodgkin's lymphoma) ([facility]) 09/25/2015   Obesity     Polycythemia    Pulmonary hypertension ([facility])    Sepsis ([facility]) 01/14/2016   Streptococcal pneumonia [facility]) August 2009    ALLERGIES:  has No Known Allergies.  MEDICATIONS:  Current Outpatient Medications  Medication Sig Dispense Refill   albuterol (PROVENTIL) (2.5 MG/3ML) 0.083% nebulizer solution Take 3 mLs (2.5 mg total) by nebulization every 4 (four) hours as needed for wheezing or shortness of breath. 75 mL 3   allopurinol (ZYLOPRIM) 300 MG tablet Take 300 mg by mouth daily.     amLODipine (NORVASC) 10 MG tablet Take 10 mg by mouth daily.     betamethasone dipropionate 0.05 % lotion Apply 1 application topically 2 (two) times daily as needed (psorasis).      finasteride (PROSCAR) 5 MG tablet Take 5 mg by mouth daily.     levothyroxine (SYNTHROID, LEVOTHROID) 150 MCG tablet Take 150 mcg by mouth daily.     lisinopril (PRINIVIL,ZESTRIL) 10 MG tablet Take 1 tablet (10 mg total) by mouth daily. 30 tablet 11   omeprazole (PRILOSEC) 20 MG capsule Take 1 capsule (20 mg total) by mouth 2 (two) times daily.     oxyCODONE-acetaminophen (PERCOCET) 10-325 MG tablet Take 1 tablet by mouth every 6 (six) hours as needed for pain.      PROAIR HFA 108 (90 Base) MCG/ACT inhaler Inhale 2 puffs into the lungs every 6 (six) hours as needed for wheezing or shortness of breath. 3 Inhaler 1   sodium chloride (OCEAN) 0.65 % SOLN nasal spray Place 1 spray into both nostrils as needed for congestion.  TRELEGY ELLIPTA 100-62.5-25 MCG/INH AEPB Inhale 1 puff into the lungs daily.     No current facility-administered medications for this visit.    SURGICAL HISTORY:  Past Surgical History:  Procedure Laterality Date   APPENDECTOMY     BACK SURGERY     CARDIOVERSION  11/27/2011   Procedure: CARDIOVERSION;  Surgeon: Thayer Headings, MD;  Location: [facility];  Service: Cardiovascular;  Laterality: N/A;   COLONOSCOPY     FUDUCIAL PLACEMENT Right 10/13/2019   Procedure: Placement Of Fuducial;   Surgeon: Collene Gobble, MD;  Location: [facility];  Service: Thoracic;  Laterality: Right;  Right Upper Lobe    LUNG CANCER SURGERY  2011   UPPER GI ENDOSCOPY     VIDEO BRONCHOSCOPY WITH ENDOBRONCHIAL NAVIGATION N/A 10/13/2019   Procedure: VIDEO BRONCHOSCOPY WITH ENDOBRONCHIAL NAVIGATION;  Surgeon: Collene Gobble, MD;  Location: [facility];  Service: Thoracic;  Laterality: N/A;   VIDEO BRONCHOSCOPY WITH ENDOBRONCHIAL ULTRASOUND N/A 10/13/2019   Procedure: VIDEO BRONCHOSCOPY WITH ENDOBRONCHIAL ULTRASOUND;  Surgeon: Collene Gobble, MD;  Location: [facility];  Service: Thoracic;  Laterality: N/A;    REVIEW OF SYSTEMS:  Constitutional: positive for fatigue Eyes: negative Ears, nose, mouth, throat, and face: negative Respiratory: positive for dyspnea on exertion and wheezing Cardiovascular: negative Gastrointestinal: negative Genitourinary:negative Integument/breast: negative Hematologic/lymphatic: negative Musculoskeletal:negative Neurological: negative Behavioral/Psych: negative Endocrine: negative Allergic/Immunologic: negative   PHYSICAL EXAMINATION: General appearance: alert, cooperative, appears stated age, fatigued and no distress Head: Normocephalic, without obvious abnormality, atraumatic Neck: no adenopathy, no JVD, supple, symmetrical, trachea midline and thyroid not enlarged, symmetric, no tenderness/mass/nodules Lymph nodes: Cervical, supraclavicular, and axillary nodes normal. Resp: clear to auscultation bilaterally Back: symmetric, no curvature. ROM normal. No CVA tenderness. Cardio: regular rate and rhythm, S1, S2 normal, no murmur, click, rub or gallop GI: soft, non-tender; bowel sounds normal; no masses,  no organomegaly Extremities: extremities normal, atraumatic, no cyanosis or edema Neurologic: Alert and oriented X 3, normal strength and tone. Normal symmetric reflexes. Normal coordination and gait  ECOG PERFORMANCE STATUS: 1 - Symptomatic but completely ambulatory  Blood  pressure 137/66, pulse 63, temperature (!) 97 F (36.1 C), temperature source Oral, resp. rate 18, height 6' (1.829 m), weight 240 lb 14.4 oz (109.3 kg), SpO2 98 %.  LABORATORY DATA: Lab Results  Component Value Date   WBC 6.6 10/03/2019   HGB 12.7 (L) 10/03/2019   HCT 39.4 10/03/2019   MCV 90.8 10/03/2019   PLT 194.0 10/03/2019      Chemistry      Component Value Date/Time   NA 140 10/03/2019 1236   NA 140 09/07/2017 1322   K 5.0 10/03/2019 1236   K 5.2 No visable hemolysis (H) 09/07/2017 1322   CL 103 10/03/2019 1236   CO2 31 10/03/2019 1236   CO2 33 (H) 09/07/2017 1322   BUN 14 10/03/2019 1236   BUN 13.6 09/07/2017 1322   CREATININE 0.97 10/03/2019 1236   CREATININE 1.08 09/07/2019 0812   CREATININE 1.0 09/07/2017 1322      Component Value Date/Time   CALCIUM 9.6 10/03/2019 1236   CALCIUM 9.1 09/07/2017 1322   ALKPHOS 83 09/07/2019 0812   ALKPHOS 92 09/07/2017 1322   AST 10 (L) 09/07/2019 0812   AST 11 09/07/2017 1322   ALT 8 09/07/2019 0812   ALT 8 09/07/2017 1322   BILITOT 0.5 09/07/2019 0812   BILITOT 0.47 09/07/2017 1322       RADIOGRAPHIC STUDIES: NM PET Image Restag (PS) Skull Base To  Thigh  Result Date: 09/20/2019 CLINICAL DATA:  Initial treatment strategy for lung mass. History of lung cancer diagnosed 2011. History of Hodgkin's lymphoma diagnosed 2017. EXAM: NUCLEAR MEDICINE PET SKULL BASE TO THIGH TECHNIQUE: 12.3 mCi F-18 FDG was injected intravenously. Full-ring PET imaging was performed from the skull base to thigh after the radiotracer. CT data was obtained and used for attenuation correction and anatomic localization. Fasting blood glucose: 90 mg/dl COMPARISON:  CT 09/06/2018, PET-CT 02/24/2016 FINDINGS: Mediastinal blood pool activity: SUV max 3.0 Liver activity: SUV max 4.2 NECK: No hypermetabolic lymph nodes in the neck. Incidental CT findings: none CHEST: 2.5 x 2.0 cm lobular RIGHT upper lobe nodule has intense metabolic activity with SUV max equal  35.9. No additional hypermetabolic pulmonary nodules. No hypermetabolic mediastinal lymph nodes. No supraclavicular adenopathy.  No axillary adenopathy. Incidental CT findings: Coronary artery calcification and aortic atherosclerotic calcification. ABDOMEN/PELVIS: No abnormal hypermetabolic activity within the liver, pancreas, adrenal glands, or spleen. No hypermetabolic lymph nodes in the abdomen or pelvis. Incidental CT findings: Bilateral benign renal cysts. Atherosclerotic calcification of the aorta. SKELETON: No focal hypermetabolic activity to suggest skeletal metastasis. Incidental CT findings: none IMPRESSION: 1. Hypermetabolic lobular RIGHT upper lobe nodule consistent with malignancy. Differential include metachronous bronchogenic carcinoma versus lymphoma recurrence. Favor bronchogenic carcinoma. 2. No evidence of metastatic lymphadenopathy. Electronically Signed   By: Suzy Bouchard M.D.   On: 09/20/2019 15:49   DG Chest Port 1 View  Result Date: 10/13/2019 CLINICAL DATA:  Status post bronchoscopy EXAM: PORTABLE CHEST 1 VIEW COMPARISON:  10/05/2019 FINDINGS: Cardiac shadow is mildly enlarged. Aortic calcifications are noted. Known right upper lobe mass lesion is seen and stable. No pneumothorax is noted following bronchoscopy. Right chest wall port is noted. Postoperative scarring in the left upper lobe is seen. IMPRESSION: No evidence of pneumothorax following bronchoscopy. Electronically Signed   By: Inez Catalina M.D.   On: 10/13/2019 14:03   CT Super D Chest Wo Contrast  Result Date: 10/05/2019 CLINICAL DATA:  Right upper lobe lung mass. Preop for navigational bronchoscopy. EXAM: CT CHEST WITHOUT CONTRAST TECHNIQUE: Multidetector CT imaging of the chest was performed using thin slice collimation for electromagnetic bronchoscopy planning purposes, without intravenous contrast. COMPARISON:  PET-CT 09/20/2019 and chest CT 09/08/2019 FINDINGS: Cardiovascular: The heart is normal in size. No  pericardial effusion. There is tortuosity and calcification of the thoracic aorta. Moderate enlargement of the pulmonary artery trunk suggesting pulmonary hypertension. Stable coronary artery calcifications. Mediastinum/Nodes: Numerous borderline mediastinal and hilar lymph nodes, stable. These were not hypermetabolic on the PET-CT. Lungs/Pleura: Stable lobulated right upper lobe lung mass measuring 3.0 x 2.5 cm with some surrounding ground-glass opacity and interstitial changes which could be postobstructive pneumonitis. No other worrisome pulmonary lesions or pulmonary nodules to suggest pulmonary metastatic disease. Stable emphysematous changes and areas of pulmonary scarring. No pleural effusions or pleural lesions. Stable right-sided pleural calcifications. Upper Abdomen: No significant upper abdominal findings. Right renal calculi are noted. Borderline splenic enlargement. Musculoskeletal: No significant bony findings. IMPRESSION: 1. Stable lobulated 3.0 x 2.5 cm right upper lobe lung mass with some surrounding interstitial and inflammatory changes. 2. Numerous mediastinal and hilar lymph nodes. These were not hypermetabolic on the prior PET-CT. 3. Emphysematous changes and pulmonary scarring but no findings for pulmonary metastatic disease. 4. Stable surgical changes from a left upper lobe lobectomy. Aortic Atherosclerosis (ICD10-I70.0) and Emphysema (ICD10-J43.9). Electronically Signed   By: Marijo Sanes M.D.   On: 10/05/2019 16:19   DG C-ARM BRONCHOSCOPY  Result Date: 10/13/2019  C-ARM BRONCHOSCOPY: Fluoroscopy was utilized by the requesting physician.  No radiographic interpretation.    ASSESSMENT AND PLAN:  This is a very pleasant 69 years old white male with 1) stage IIIa large B-cell non-Hodgkin lymphoma status post 6 cycles of systemic chemotherapy with CHOP/Rituxan with almost complete response. The patient has been on observation for several years. 2) stage Ia (T1c, N0, M0) non-small cell  lung cancer, squamous cell carcinoma presented with right upper lobe pulmonary mass.  This was found on repeat imaging studies with CT scan of the chest as well as a PET scan that showed hypermetabolic activity in the right upper lobe lung mass with no evidence for lymphadenopathy or distant metastatic disease. I had a lengthy discussion with the patient today about his current condition and treatment options.  I discussed with him the pathology report.  The patient is not a great surgical candidate because of his comorbidities as well as COPD. I recommended for the patient referral to radiation oncology for consideration of SBRT to the right upper lobe lung mass. I will see him back for follow-up visit in 4 months for evaluation and restaging scan of the chest for evaluation of his response to the treatment. The patient was advised to call immediately if he has any concerning symptoms in the interval.  The patient voices understanding of current disease status and treatment options and is in agreement with the current care plan. All questions were answered. The patient knows to call the clinic with any problems, questions or concerns. We can certainly see the patient much sooner if necessary.  Disclaimer: This note was dictated with voice recognition software. Similar sounding words can inadvertently be transcribed and may not be corrected upon review.

## 2019-10-23 NOTE — Progress Notes (Signed)
Thoracic Location of Tumor / Histology: stage Ia (T1c, N0, M0) non-small cell lung cancer, squamous cell carcinoma presented with right upper lobe pulmonary mass  Biopsies revealed: 10/13/19: FINAL MICROSCOPIC DIAGNOSIS:   A. LUNG, RIGHT UPPER LOBE NODULE, BIOPSY:  - Squamous cell carcinoma.  FINAL MICROSCOPIC DIAGNOSIS:   A. LUNG, RUL, BRUSH:  - Malignant cells consistent with non-small cell carcinoma.  - See comment.   B. LUNG, RUL, FINE NEEDLE ASPIRATION:  - Malignant cells consistent with non-small cell carcinoma.  - See comment.   COMMENT:  The concurrent right upper lobe biopsies ([id number]) show squamous cell  carcinoma.   FINAL MICROSCOPIC DIAGNOSIS:   C. LYMPH NODE, 7, FINE NEEDLE ASPIRATION:  - No malignant cells identified   Tobacco/Marijuana/Snuff/ETOH use:  Tobacco Use  Smoking Status Former Smoker  . Packs/day: 3.00  . Years: 43.00  . Pack years: 129.00  . Types: Cigarettes  . Last attempt to quit: 10/22/2001  . Years since quitting: 17.0  Smokeless Tobacco Never Used     Past/Anticipated interventions by cardiothoracic surgery, if any: Pt is not a surgical candidate.  Past/Anticipated interventions by medical oncology, if any: Per Dr. Julien Nordmann 10/19/19: ASSESSMENT AND PLAN:  This is a very pleasant 69 years old white male with 1) stage IIIa large B-cell non-Hodgkin lymphoma status post 6 cycles of systemic chemotherapy with CHOP/Rituxan with almost complete response. The patient has been on observation for several years. 2) stage Ia (T1c, N0, M0) non-small cell lung cancer, squamous cell carcinoma presented with right upper lobe pulmonary mass.  This was found on repeat imaging studies with CT scan of the chest as well as a PET scan that showed hypermetabolic activity in the right upper lobe lung mass with no evidence for lymphadenopathy or distant metastatic disease. I had a lengthy discussion with the patient today about his current condition and treatment  options.  I discussed with him the pathology report.  The patient is not a great surgical candidate because of his comorbidities as well as COPD. I recommended for the patient referral to radiation oncology for consideration of SBRT to the right upper lobe lung mass. I will see him back for follow-up visit in 4 months for evaluation and restaging scan of the chest for evaluation of his response to the treatment. The patient was advised to call immediately if he has any concerning symptoms in the interval.  Signs/Symptoms Weight changes, if any:  Wt Readings from Last 3 Encounters:  10/25/19 235 lb 6 oz (106.8 kg)  10/19/19 240 lb 14.4 oz (109.3 kg)  10/13/19 235 lb 0.2 oz (106.6 kg)      no concerning complaints except for the baseline shortness of breath increased with exertion.  He denied having any current chest pain but has mild cough and wheezing with no hemoptysis.  He denied having any fever or chills.  He has no nausea, vomiting, diarrhea or constipation.  He has no recent weight loss or night sweats.  Pain issues, if any:  Pt denies c/o pain.  SAFETY ISSUES:  Prior radiation? no  Pacemaker/ICD? no   Possible current pregnancy?N/A  Is the patient on methotrexate? no  Current Complaints / other details:  Pt presents today for initial consult with Dr. Sondra Come for Radiation Oncology. Pt is unaccompanied.  BP (!) 145/74 (BP Location: Left Arm, Patient Position: Sitting)   Pulse 72   Temp 98 F (36.7 C) (Temporal)   Resp 18   Ht 6' (1.829 m)   [facility]  235 lb 6 oz (106.8 kg)   SpO2 93%   BMI 31.92 kg/m   Loma Sousa, RN BSN

## 2019-10-24 ENCOUNTER — Telehealth: Payer: Self-pay | Admitting: Internal Medicine

## 2019-10-24 NOTE — Telephone Encounter (Signed)
Scheduled per los. Called and left msg. Mailed printout  °

## 2019-10-25 ENCOUNTER — Ambulatory Visit
Admission: RE | Admit: 2019-10-25 | Discharge: 2019-10-25 | Disposition: A | Discharge status: Home / Self Care | Payer: PPO | Source: Ambulatory Visit | Attending: Radiation Oncology | Admitting: Radiation Oncology

## 2019-10-25 ENCOUNTER — Encounter: Payer: Self-pay | Admitting: Radiation Oncology

## 2019-10-25 ENCOUNTER — Other Ambulatory Visit: Payer: Self-pay

## 2019-10-25 DIAGNOSIS — D701 Agranulocytosis secondary to cancer chemotherapy: Secondary | ICD-10-CM | POA: Insufficient documentation

## 2019-10-25 DIAGNOSIS — C8338 Diffuse large B-cell lymphoma, lymph nodes of multiple sites: Secondary | ICD-10-CM | POA: Diagnosis not present

## 2019-10-25 DIAGNOSIS — Z79899 Other long term (current) drug therapy: Secondary | ICD-10-CM | POA: Diagnosis not present

## 2019-10-25 DIAGNOSIS — C3411 Malignant neoplasm of upper lobe, right bronchus or lung: Secondary | ICD-10-CM | POA: Diagnosis not present

## 2019-10-25 DIAGNOSIS — Z9221 Personal history of antineoplastic chemotherapy: Secondary | ICD-10-CM | POA: Insufficient documentation

## 2019-10-25 DIAGNOSIS — E669 Obesity, unspecified: Secondary | ICD-10-CM | POA: Diagnosis not present

## 2019-10-25 DIAGNOSIS — N200 Calculus of kidney: Secondary | ICD-10-CM | POA: Insufficient documentation

## 2019-10-25 DIAGNOSIS — E039 Hypothyroidism, unspecified: Secondary | ICD-10-CM | POA: Insufficient documentation

## 2019-10-25 DIAGNOSIS — Z87891 Personal history of nicotine dependence: Secondary | ICD-10-CM | POA: Insufficient documentation

## 2019-10-25 DIAGNOSIS — C3491 Malignant neoplasm of unspecified part of right bronchus or lung: Secondary | ICD-10-CM

## 2019-10-25 DIAGNOSIS — F329 Major depressive disorder, single episode, unspecified: Secondary | ICD-10-CM | POA: Insufficient documentation

## 2019-10-25 DIAGNOSIS — K219 Gastro-esophageal reflux disease without esophagitis: Secondary | ICD-10-CM | POA: Diagnosis not present

## 2019-10-25 DIAGNOSIS — N400 Enlarged prostate without lower urinary tract symptoms: Secondary | ICD-10-CM | POA: Insufficient documentation

## 2019-10-25 DIAGNOSIS — R161 Splenomegaly, not elsewhere classified: Secondary | ICD-10-CM | POA: Diagnosis not present

## 2019-10-25 DIAGNOSIS — I272 Pulmonary hypertension, unspecified: Secondary | ICD-10-CM | POA: Insufficient documentation

## 2019-10-25 DIAGNOSIS — I10 Essential (primary) hypertension: Secondary | ICD-10-CM | POA: Diagnosis not present

## 2019-10-25 DIAGNOSIS — Z8572 Personal history of non-Hodgkin lymphomas: Secondary | ICD-10-CM | POA: Diagnosis not present

## 2019-10-25 DIAGNOSIS — M129 Arthropathy, unspecified: Secondary | ICD-10-CM | POA: Diagnosis not present

## 2019-10-25 DIAGNOSIS — I509 Heart failure, unspecified: Secondary | ICD-10-CM | POA: Diagnosis not present

## 2019-10-25 DIAGNOSIS — J449 Chronic obstructive pulmonary disease, unspecified: Secondary | ICD-10-CM | POA: Diagnosis not present

## 2019-10-25 HISTORY — DX: Malignant neoplasm of upper lobe, right bronchus or lung: C34.11

## 2019-10-25 NOTE — Patient Instructions (Signed)
Coronavirus (COVID-19) Are you at risk?  Are you at risk for the Coronavirus (COVID-19)?  To be considered HIGH RISK for Coronavirus (COVID-19), you have to meet the following criteria:  . Traveled to [place], [place], [place], [place] or [place]; or in the [place] to [place], [place], [place], or [place]; and have fever, cough, and shortness of breath within the last 2 weeks of travel OR . Been in close contact with a person diagnosed with COVID-19 within the last 2 weeks and have fever, cough, and shortness of breath . IF YOU DO NOT MEET THESE CRITERIA, YOU ARE CONSIDERED LOW RISK FOR COVID-19.  What to do if you are HIGH RISK for COVID-19?  . If you are having a medical emergency, call 911. . Seek medical care right away. Before you go to a doctor's office, urgent care or emergency department, call ahead and tell them about your recent travel, contact with someone diagnosed with COVID-19, and your symptoms. You should receive instructions from your physician's office regarding next steps of care.  . When you arrive at healthcare provider, tell the healthcare staff immediately you have returned from visiting [place], [place], [place], [place] or [place]; or traveled in the [place] to [place], [place], [place], or [place]; in the last two weeks or you have been in close contact with a person diagnosed with COVID-19 in the last 2 weeks.   . Tell the health care staff about your symptoms: fever, cough and shortness of breath. . After you have been seen by a medical provider, you will be either: o Tested for (COVID-19) and discharged home on quarantine except to seek medical care if symptoms worsen, and asked to  - Stay home and avoid contact with others until you get your results (4-5 days)  - Avoid travel on public transportation if possible (such as bus, train, or airplane) or o Sent to the Emergency Department by EMS for evaluation, COVID-19 testing, and possible  admission depending on your condition and test results.  What to do if you are LOW RISK for COVID-19?  Reduce your risk of any infection by using the same precautions used for avoiding the common cold or flu:  . Wash your hands often with soap and warm water for at least 20 seconds.  If soap and water are not readily available, use an alcohol-based hand sanitizer with at least 60% alcohol.  . If coughing or sneezing, cover your mouth and nose by coughing or sneezing into the elbow areas of your shirt or coat, into a tissue or into your sleeve (not your hands). . Avoid shaking hands with others and consider head nods or verbal greetings only. . Avoid touching your eyes, nose, or mouth with unwashed hands.  . Avoid close contact with people who are sick. . Avoid places or events with large numbers of people in one location, like concerts or sporting events. . Carefully consider travel plans you have or are making. . If you are planning any travel outside or inside the [place], visit the CDC's Travelers' Health webpage for the latest health notices. . If you have some symptoms but not all symptoms, continue to monitor at home and seek medical attention if your symptoms worsen. . If you are having a medical emergency, call 911.   ADDITIONAL HEALTHCARE OPTIONS FOR PATIENTS  [facility] / e-Visit: [email or web address]         [facility]: [phone]  [facility]: [phone]                   [facility]: [phone]   

## 2019-10-25 NOTE — Progress Notes (Signed)
Radiation Oncology         [phone] ________________________________  Initial Outpatient Consultation  Name: [name] MRN: [id number]  Date: 10/25/2019  DOB: 1950-10-31  DX:IPJASN, Lupita Raider, PA  Curt Bears, MD   REFERRING PHYSICIAN: Curt Bears, MD  DIAGNOSIS: The encounter diagnosis was Primary cancer of right upper lobe of lung ([place]).  1) Clinical stage IA3 (T1c, N0, M0) non-small cell lung cancer, squamous cell carcinoma diagnosed in January 2021  2) Stage III large B-cell non-Hodgkin's lymphoma diagnosed in December 2016 and presented with extensive lymphadenopathy involving the neck, chest, abdomen as well as splenomegaly.   3) Stage I  Non small lung cancer (left upper lobe) Surgery alone, Dr [name]  HISTORY OF PRESENT ILLNESS::[name] is a 69 y.o. male who is accompanied by no one due to COVID-19 restrictions. The patient had a routine follow-up CT of chest/abdomen/pelvis for history of Non-Hodgkin's Lymphoma on 09/08/2019. Results showed new central right upper lobe pulmonary nodule with differential considerations of metachronous primary bronchogenic carcinoma or recurrent lymphoma. It also showed a slight increase in the size of the nodes within the chest and abdomen, for which recurrent lymphoma could not be excluded.   PET scan on 09/20/2019 showed hypermetabolic lobular right upper lobe nodule consistent with malignancy. Differentials included metachronous bronchogenic carcinoma versus lymphoma recurrence, favoring bronchogenic carcinoma. There was no evidence of metastatic lymphadenopathy.   CT of chest on 10/05/2019 showed stable lobulated 3.0 x 2.5 cm right upper lobe lung mass with some surrounding interstitial and inflammatory changes. There were also numerous mediastinal and hilar lymph nodes that were not hypermetabolic on the prior PET-CT. No findings for pulmonary metastatic disease.   Patient underwent bronchoscopy with electromagnetic  navigation and biopsies under the care of Dr. Lamonte Sakai on 10/13/2019. Pathology report from the procedure showed squamous cell carcinoma of the right upper lobe lung nodule. Right upper lobe lung brush and fine needle aspiration both showed malignant cells that were consistent with non-small cell carcinoma. No malignant cells were identified in the lymph node that was biopsied.  The enlarged 4L node the could not be biopsied due to distance issues but was not hypermetabolic on PET scan.  Dr. Lamonte Sakai kindly placed 3 fiducial markers to aid in stereotactic body radiation therapy planning and treatment.  The patient has been under the care of Dr. Julien Nordmann since 2017, whom he last saw on 10/19/2019. He did not have any concerning complaints at that time with the exception of baseline shortness of breath increased with exertion. He has been treated for large B-cell non-Hodgkin's lymphoma with systemic chemotherapy with CHOP/Rituxan with almost complete response. Patient is not currently on any chemotherapy and has simply been under observation for several years.  Of note, the patient has a remote history of left-sided lung cancer status post left upper lobe lobectomy in 2011 Dr. [name].  Patient reports not having any adjuvant treatment for this issue.   PREVIOUS RADIATION THERAPY: No  PAST MEDICAL HISTORY:  Past Medical History:  Diagnosis Date  . Arthritis   . BPH (benign prostatic hyperplasia) 2011  . Congestive heart failure ([place])   . COPD (chronic obstructive pulmonary disease) ([place])   . Depression   . Drug-induced neutropenia ([place]) 10/08/2015  . Encounter for antineoplastic chemotherapy 10/08/2015  . Gastrointestinal obstruction ([facility])    Ileus  . GERD (gastroesophageal reflux disease)   . Gout   . Hx of colonoscopy   . Hypertension   . Hypothyroid   .  Lung cancer ([place]) 2011  . NHL (non-Hodgkin's lymphoma) ([place]) 09/25/2015  . Obesity   . Polycythemia   . Pulmonary hypertension ([place])   . Sepsis  ([place]) 01/14/2016  . Streptococcal pneumonia [facility]) August 2009    PAST SURGICAL HISTORY: Past Surgical History:  Procedure Laterality Date  . APPENDECTOMY    . BACK SURGERY    . CARDIOVERSION  11/27/2011   Procedure: CARDIOVERSION;  Surgeon: Thayer Headings, MD;  Location[facility] ENDOSCOPY;  Service: Cardiovascular;  Laterality: N/A;  . COLONOSCOPY    . FUDUCIAL PLACEMENT Right 10/13/2019   Procedure: Placement Of Fuducial;  Surgeon: Collene Gobble, MD;  Location: [facility] OR;  Service: Thoracic;  Laterality: Right;  Right Upper Lobe   . LUNG CANCER SURGERY  2011  . UPPER GI ENDOSCOPY    . VIDEO BRONCHOSCOPY WITH ENDOBRONCHIAL NAVIGATION N/A 10/13/2019   Procedure: VIDEO BRONCHOSCOPY WITH ENDOBRONCHIAL NAVIGATION;  Surgeon: Collene Gobble, MD;  Location: [facility] OR;  Service: Thoracic;  Laterality: N/A;  . VIDEO BRONCHOSCOPY WITH ENDOBRONCHIAL ULTRASOUND N/A 10/13/2019   Procedure: VIDEO BRONCHOSCOPY WITH ENDOBRONCHIAL ULTRASOUND;  Surgeon: Collene Gobble, MD;  Location: [facility] OR;  Service: Thoracic;  Laterality: N/A;    FAMILY HISTORY:  Family History  Problem Relation Age of Onset  . Heart disease Father   . Cancer Neg Hx   . Diabetes Neg Hx   . Stroke Neg Hx   . Hypertension Neg Hx   . Kidney disease Neg Hx     SOCIAL HISTORY:  Social History   Tobacco Use  . Smoking status: Former Smoker    Packs/day: 3.00    Years: 43.00    Pack years: 129.00    Types: Cigarettes    Quit date: 10/22/2001    Years since quitting: 18.0  . Smokeless tobacco: Never Used  Substance Use Topics  . Alcohol use: No  . Drug use: No    ALLERGIES: No Known Allergies  MEDICATIONS:  Current Outpatient Medications  Medication Sig Dispense Refill  . albuterol (PROVENTIL) (2.5 MG/3ML) 0.083% nebulizer solution Take 3 mLs (2.5 mg total) by nebulization every 4 (four) hours as needed for wheezing or shortness of breath. 75 mL 3  . allopurinol (ZYLOPRIM) 300 MG tablet Take 300 mg by mouth daily.    [name] amLODipine  (NORVASC) 10 MG tablet Take 10 mg by mouth daily.    . betamethasone dipropionate 0.05 % lotion Apply 1 application topically 2 (two) times daily as needed (psorasis).     . finasteride (PROSCAR) 5 MG tablet Take 5 mg by mouth daily.    [name] levothyroxine (SYNTHROID, LEVOTHROID) 150 MCG tablet Take 150 mcg by mouth daily.    [name] lisinopril (PRINIVIL,ZESTRIL) 10 MG tablet Take 1 tablet (10 mg total) by mouth daily. 30 tablet 11  . omeprazole (PRILOSEC) 20 MG capsule Take 1 capsule (20 mg total) by mouth 2 (two) times daily.    [name] oxyCODONE-acetaminophen (PERCOCET) 10-325 MG tablet Take 1 tablet by mouth every 6 (six) hours as needed for pain.     [name] PROAIR HFA 108 (90 Base) MCG/ACT inhaler Inhale 2 puffs into the lungs every 6 (six) hours as needed for wheezing or shortness of breath. 3 Inhaler 1  . sodium chloride (OCEAN) 0.65 % SOLN nasal spray Place 1 spray into both nostrils as needed for congestion.    . TRELEGY ELLIPTA 100-62.5-25 MCG/INH AEPB Inhale 1 puff into the lungs daily.     No current facility-administered medications for this  encounter.    REVIEW OF SYSTEMS:  A 10+ POINT REVIEW OF SYSTEMS WAS OBTAINED including neurology, dermatology, psychiatry, cardiac, respiratory, lymph, extremities, GI, GU, musculoskeletal, constitutional, reproductive, HEENT.  Denies any pain within the chest area significant cough or hemoptysis.  Patient denies any new bony pain headaches dizziness or blurred vision   PHYSICAL EXAM:  height is 6' (1.829 m) and weight is 235 lb 6 oz (106.8 kg). His temporal temperature is 98 F (36.7 C). His blood pressure is 145/74 (abnormal) and his pulse is 72. His respiration is 18 and oxygen saturation is 93%.   General: Alert and oriented, in no acute distress HEENT: Head is normocephalic. Extraocular movements are intact.  Neck: Neck is supple, no palpable cervical or supraclavicular lymphadenopathy. Heart: Regular in rate and rhythm with no murmurs, rubs, or  gallops. Chest: Clear to auscultation bilaterally, with no rhonchi, wheezes, or rales.  Thoracotomy scar in the left lateral chest from prior lobectomy with Dr. [name] Abdomen: Soft, nontender, nondistended, with no rigidity or guarding. Extremities: No cyanosis or edema. Lymphatics: see Neck Exam Skin: No concerning lesions. Musculoskeletal: symmetric strength and muscle tone throughout. Neurologic: Cranial nerves II through XII are grossly intact. No obvious focalities. Speech is fluent. Coordination is intact. Psychiatric: Judgment and insight are intact. Affect is appropriate.   ECOG = 1  0 - Asymptomatic (Fully active, able to carry on all predisease activities without restriction)  1 - Symptomatic but completely ambulatory (Restricted in physically strenuous activity but ambulatory and able to carry out work of a light or sedentary nature. For example, light housework, office work)  2 - Symptomatic, <50% in bed during the day (Ambulatory and capable of all self care but unable to carry out any work activities. Up and about more than 50% of waking hours)  3 - Symptomatic, >50% in bed, but not bedbound (Capable of only limited self-care, confined to bed or chair 50% or more of waking hours)  4 - Bedbound (Completely disabled. Cannot carry on any self-care. Totally confined to bed or chair)  5 - Death   Eustace Pen MM, Creech RH, Tormey DC, et al. [phone]). "Toxicity and response criteria of the [facility]". [place] Oncol. 5 (6): 649-55  LABORATORY DATA:  Lab Results  Component Value Date   WBC 6.6 10/03/2019   HGB 12.7 (L) 10/03/2019   HCT 39.4 10/03/2019   MCV 90.8 10/03/2019   PLT 194.0 10/03/2019   NEUTROABS 4.9 10/03/2019   Lab Results  Component Value Date   NA 140 10/03/2019   K 5.0 10/03/2019   CL 103 10/03/2019   CO2 31 10/03/2019   GLUCOSE 130 (H) 10/03/2019   CREATININE 0.97 10/03/2019   CALCIUM 9.6 10/03/2019      RADIOGRAPHY: DG  Chest Port 1 View  Result Date: 10/13/2019 CLINICAL DATA:  Status post bronchoscopy EXAM: PORTABLE CHEST 1 VIEW COMPARISON:  10/05/2019 FINDINGS: Cardiac shadow is mildly enlarged. Aortic calcifications are noted. Known right upper lobe mass lesion is seen and stable. No pneumothorax is noted following bronchoscopy. Right chest wall port is noted. Postoperative scarring in the left upper lobe is seen. IMPRESSION: No evidence of pneumothorax following bronchoscopy. Electronically Signed   By: Inez Catalina M.D.   On: 10/13/2019 14:03   CT Super D Chest Wo Contrast  Result Date: 10/05/2019 CLINICAL DATA:  Right upper lobe lung mass. Preop for navigational bronchoscopy. EXAM: CT CHEST WITHOUT CONTRAST TECHNIQUE: Multidetector CT imaging of the chest was performed  using thin slice collimation for electromagnetic bronchoscopy planning purposes, without intravenous contrast. COMPARISON:  PET-CT 09/20/2019 and chest CT 09/08/2019 FINDINGS: Cardiovascular: The heart is normal in size. No pericardial effusion. There is tortuosity and calcification of the thoracic aorta. Moderate enlargement of the pulmonary artery trunk suggesting pulmonary hypertension. Stable coronary artery calcifications. Mediastinum/Nodes: Numerous borderline mediastinal and hilar lymph nodes, stable. These were not hypermetabolic on the PET-CT. Lungs/Pleura: Stable lobulated right upper lobe lung mass measuring 3.0 x 2.5 cm with some surrounding ground-glass opacity and interstitial changes which could be postobstructive pneumonitis. No other worrisome pulmonary lesions or pulmonary nodules to suggest pulmonary metastatic disease. Stable emphysematous changes and areas of pulmonary scarring. No pleural effusions or pleural lesions. Stable right-sided pleural calcifications. Upper Abdomen: No significant upper abdominal findings. Right renal calculi are noted. Borderline splenic enlargement. Musculoskeletal: No significant bony findings.  IMPRESSION: 1. Stable lobulated 3.0 x 2.5 cm right upper lobe lung mass with some surrounding interstitial and inflammatory changes. 2. Numerous mediastinal and hilar lymph nodes. These were not hypermetabolic on the prior PET-CT. 3. Emphysematous changes and pulmonary scarring but no findings for pulmonary metastatic disease. 4. Stable surgical changes from a left upper lobe lobectomy. Aortic Atherosclerosis (ICD10-I70.0) and Emphysema (ICD10-J43.9). Electronically Signed   By: Marijo Sanes M.D.   On: 10/05/2019 16:19   DG C-ARM BRONCHOSCOPY  Result Date: 10/13/2019 C-ARM BRONCHOSCOPY: Fluoroscopy was utilized by the requesting physician.  No radiographic interpretation.      IMPRESSION: Clinical Stage IA3 (T1c, N0, M0) non-small cell lung cancer, squamous cell carcinoma  Discussed general options for management of this early stage lung cancer with the patient.  We discussed potential consideration for surgery but given the patient's prior left upper lobe lobectomy and his chronic obstructive pulmonary disease this would not be a good option for him.  Does not use oxygen at night.  The other option for this patient would be to consider stereotactic body radiation therapy.  Based on the size of the lesion and location he would be an excellent candidate for this therapy.  I discussed the overall treatment course side effects and potential toxicities of stereotactic body radiation therapy with the patient.  He appears to understand and wishes to proceed with planned course of treatment   PLAN: Patient will return for CT simulation on February 9 with treatments to begin the week of February 15.  Anticipate 3 stereotactic body radiation treatments directed at the right upper lobe lung mass.     ------------------------------------------------  Blair Promise, PhD, MD  This document serves as a record of services personally performed by Gery Pray, MD. It was created on his behalf by Clerance Lav,  a trained medical scribe. The creation of this record is based on the scribe's personal observations and the provider's statements to them. This document has been checked and approved by the attending provider.

## 2019-10-26 ENCOUNTER — Other Ambulatory Visit: Payer: Self-pay | Admitting: *Deleted

## 2019-10-26 NOTE — Progress Notes (Signed)
The proposed treatment discussed in cancer conference 10/26/19 is for discussion purpose only and is not a binding recommendation.  The patient was not physically examined nor present for their treatment options.  Therefore, final treatment plans cannot be decided.

## 2019-10-27 DIAGNOSIS — J449 Chronic obstructive pulmonary disease, unspecified: Secondary | ICD-10-CM | POA: Diagnosis not present

## 2019-10-30 NOTE — Progress Notes (Signed)
  Radiation Oncology         [phone] ________________________________  Name: [name] MRN: [id number]  Date: 10/31/2019  DOB: Mar 12, 1951   STEREOTACTIC BODY RADIOTHERAPY SIMULATION AND TREATMENT PLANNING NOTE    DIAGNOSIS: Clinicalstage IA3(T1c, N0, M0) non-small cell lung cancer, squamous cell carcinoma  NARRATIVE:  The patient was brought to the [place].  Identity was confirmed.  All relevant records and images related to the planned course of therapy were reviewed.  The patient freely provided informed written consent to proceed with treatment after reviewing the details related to the planned course of therapy. The consent form was witnessed and verified by the simulation staff.  Then, the patient was set-up in a stable reproducible  supine position for radiation therapy.  A BodyFix immobilization pillow was fabricated for reproducible positioning.  Then I personally applied the abdominal compression paddle to limit respiratory excursion.  4D respiratoy motion management CT images were obtained.  Surface markings were placed.  The CT images were loaded into the planning software.  Then, using Cine, MIP, and standard views, the internal target volume (ITV) and planning target volumes (PTV) were delinieated, and avoidance structures were contoured.  Treatment planning then occurred.  The radiation prescription was entered and confirmed.  A total of two complex treatment devices were fabricated in the form of the BodyFix immobilization pillow and a neck accuform cushion.  I have requested : 3D Simulation  I have requested a DVH of the following structures: Heart, Lungs, Esophagus, Chest Wall, Brachial Plexus, Major Blood Vessels, and targets.  PLAN:  The patient will receive 54 Gy in 3 fractions.  -----------------------------------  Blair Promise, PhD, MD  This document serves as a record of services personally performed by Gery Pray, MD. It was created  on his behalf by Clerance Lav, a trained medical scribe. The creation of this record is based on the scribe's personal observations and the provider's statements to them. This document has been checked and approved by the attending provider.

## 2019-10-31 ENCOUNTER — Other Ambulatory Visit: Payer: Self-pay

## 2019-10-31 ENCOUNTER — Ambulatory Visit
Admission: RE | Admit: 2019-10-31 | Discharge: 2019-10-31 | Disposition: A | Discharge status: Home / Self Care | Payer: PPO | Source: Ambulatory Visit | Attending: Radiation Oncology | Admitting: Radiation Oncology

## 2019-10-31 DIAGNOSIS — Z510 Encounter for antineoplastic radiation therapy: Secondary | ICD-10-CM | POA: Diagnosis not present

## 2019-10-31 DIAGNOSIS — C3411 Malignant neoplasm of upper lobe, right bronchus or lung: Secondary | ICD-10-CM | POA: Diagnosis not present

## 2019-11-04 DIAGNOSIS — Z510 Encounter for antineoplastic radiation therapy: Secondary | ICD-10-CM | POA: Diagnosis not present

## 2019-11-04 DIAGNOSIS — C3411 Malignant neoplasm of upper lobe, right bronchus or lung: Secondary | ICD-10-CM | POA: Diagnosis not present

## 2019-11-06 DIAGNOSIS — G8929 Other chronic pain: Secondary | ICD-10-CM | POA: Diagnosis not present

## 2019-11-06 DIAGNOSIS — M545 Low back pain: Secondary | ICD-10-CM | POA: Diagnosis not present

## 2019-11-06 DIAGNOSIS — Z1159 Encounter for screening for other viral diseases: Secondary | ICD-10-CM | POA: Diagnosis not present

## 2019-11-06 DIAGNOSIS — R0602 Shortness of breath: Secondary | ICD-10-CM | POA: Diagnosis not present

## 2019-11-06 DIAGNOSIS — Z79899 Other long term (current) drug therapy: Secondary | ICD-10-CM | POA: Diagnosis not present

## 2019-11-06 NOTE — Progress Notes (Signed)
  Radiation Oncology         [phone] ________________________________  Name: [name] MRN: [id number]  Date: 11/07/2019  DOB: 1951-04-30  Stereotactic Body Radiotherapy Treatment Procedure Note  NARRATIVE:  [name] was brought to the stereotactic radiation treatment machine and placed supine on the CT couch. The patient was set up for stereotactic body radiotherapy on the body fix pillow.  3D TREATMENT PLANNING AND DOSIMETRY:  The patient's radiation plan was reviewed and approved prior to starting treatment.  It showed 3-dimensional radiation distributions overlaid onto the planning CT.  The [facility] for the target structures as well as the organs at risk were reviewed. The documentation of this is filed in the radiation oncology EMR.  SIMULATION VERIFICATION:  The patient underwent CT imaging on the treatment unit.  These were carefully aligned to document that the ablative radiation dose would cover the target volume and maximally spare the nearby organs at risk according to the planned distribution.  SPECIAL TREATMENT PROCEDURE: [name] received high dose ablative stereotactic body radiotherapy to the planned target volume without unforeseen complications. Treatment was delivered uneventfully. The high doses associated with stereotactic body radiotherapy and the significant potential risks require careful treatment set up and patient monitoring constituting a special treatment procedure   STEREOTACTIC TREATMENT MANAGEMENT:  Following delivery, the patient was evaluated clinically. The patient tolerated treatment without significant acute effects, and was discharged to home in stable condition.    PLAN: Continue treatment as planned.  ________________________________  Blair Promise, PhD, MD  This document serves as a record of services personally performed by Gery Pray, MD. It was created on his behalf by Clerance Lav, a trained medical scribe. The creation of  this record is based on the scribe's personal observations and the provider's statements to them. This document has been checked and approved by the attending provider.

## 2019-11-07 ENCOUNTER — Ambulatory Visit
Admission: RE | Admit: 2019-11-07 | Discharge: 2019-11-07 | Disposition: A | Discharge status: Home / Self Care | Payer: PPO | Source: Ambulatory Visit | Attending: Radiation Oncology | Admitting: Radiation Oncology

## 2019-11-07 ENCOUNTER — Other Ambulatory Visit: Payer: Self-pay

## 2019-11-07 DIAGNOSIS — C3411 Malignant neoplasm of upper lobe, right bronchus or lung: Secondary | ICD-10-CM

## 2019-11-07 DIAGNOSIS — Z510 Encounter for antineoplastic radiation therapy: Secondary | ICD-10-CM | POA: Diagnosis not present

## 2019-11-09 ENCOUNTER — Other Ambulatory Visit: Payer: Self-pay

## 2019-11-09 ENCOUNTER — Ambulatory Visit
Admission: RE | Admit: 2019-11-09 | Discharge: 2019-11-09 | Disposition: A | Discharge status: Home / Self Care | Payer: PPO | Source: Ambulatory Visit | Attending: Radiation Oncology | Admitting: Radiation Oncology

## 2019-11-09 DIAGNOSIS — C3411 Malignant neoplasm of upper lobe, right bronchus or lung: Secondary | ICD-10-CM

## 2019-11-09 DIAGNOSIS — Z510 Encounter for antineoplastic radiation therapy: Secondary | ICD-10-CM | POA: Diagnosis not present

## 2019-11-09 NOTE — Progress Notes (Signed)
  Radiation Oncology         [phone] ________________________________  Name: [name] MRN: [id number]  Date: 11/09/2019  DOB: 01-26-1951  Stereotactic Body Radiotherapy Treatment Procedure Note  NARRATIVE:  [name] was brought to the stereotactic radiation treatment machine and placed supine on the CT couch. The patient was set up for stereotactic body radiotherapy on the body fix pillow.  3D TREATMENT PLANNING AND DOSIMETRY:  The patient's radiation plan was reviewed and approved prior to starting treatment.  It showed 3-dimensional radiation distributions overlaid onto the planning CT.  The [facility] for the target structures as well as the organs at risk were reviewed. The documentation of this is filed in the radiation oncology EMR.  SIMULATION VERIFICATION:  The patient underwent CT imaging on the treatment unit.  These were carefully aligned to document that the ablative radiation dose would cover the target volume and maximally spare the nearby organs at risk according to the planned distribution.  SPECIAL TREATMENT PROCEDURE: [name] received high dose ablative stereotactic body radiotherapy to the planned target volume without unforeseen complications. Treatment was delivered uneventfully. The high doses associated with stereotactic body radiotherapy and the significant potential risks require careful treatment set up and patient monitoring constituting a special treatment procedure   STEREOTACTIC TREATMENT MANAGEMENT:  Following delivery, the patient was evaluated clinically. The patient tolerated treatment without significant acute effects, and was discharged to home in stable condition.    PLAN: Continue treatment as planned.  ________________________________  Blair Promise, PhD, MD  This document serves as a record of services personally performed by Gery Pray, MD. It was created on his behalf by Wilburn Mylar, a trained medical scribe. The creation  of this record is based on the scribe's personal observations and the provider's statements to them. This document has been checked and approved by the attending provider.

## 2019-11-12 NOTE — Progress Notes (Signed)
  Radiation Oncology         [phone] ________________________________  Name: [name] MRN: [id number]  Date: 11/14/2019  DOB: 1950/12/04  Stereotactic Body Radiotherapy Treatment Procedure Note  NARRATIVE:  [name] was brought to the stereotactic radiation treatment machine and placed supine on the CT couch. The patient was set up for stereotactic body radiotherapy on the body fix pillow.  3D TREATMENT PLANNING AND DOSIMETRY:  The patient's radiation plan was reviewed and approved prior to starting treatment.  It showed 3-dimensional radiation distributions overlaid onto the planning CT.  The [facility] for the target structures as well as the organs at risk were reviewed. The documentation of this is filed in the radiation oncology EMR.  SIMULATION VERIFICATION:  The patient underwent CT imaging on the treatment unit.  These were carefully aligned to document that the ablative radiation dose would cover the target volume and maximally spare the nearby organs at risk according to the planned distribution.  SPECIAL TREATMENT PROCEDURE: [name] received high dose ablative stereotactic body radiotherapy to the planned target volume without unforeseen complications. Treatment was delivered uneventfully. The high doses associated with stereotactic body radiotherapy and the significant potential risks require careful treatment set up and patient monitoring constituting a special treatment procedure   STEREOTACTIC TREATMENT MANAGEMENT:  Following delivery, the patient was evaluated clinically. The patient tolerated treatment without significant acute effects, and was discharged to home in stable condition.    PLAN: Continue treatment as planned.  ________________________________  Blair Promise, PhD, MD  This document serves as a record of services personally performed by Gery Pray, MD. It was created on his behalf by Clerance Lav, a trained medical scribe. The creation of  this record is based on the scribe's personal observations and the provider's statements to them. This document has been checked and approved by the attending provider.

## 2019-11-14 ENCOUNTER — Ambulatory Visit
Admission: RE | Admit: 2019-11-14 | Discharge: 2019-11-14 | Disposition: A | Discharge status: Home / Self Care | Payer: PPO | Source: Ambulatory Visit | Attending: Radiation Oncology | Admitting: Radiation Oncology

## 2019-11-14 ENCOUNTER — Encounter: Payer: Self-pay | Admitting: Radiation Oncology

## 2019-11-14 DIAGNOSIS — C3411 Malignant neoplasm of upper lobe, right bronchus or lung: Secondary | ICD-10-CM

## 2019-11-14 DIAGNOSIS — Z510 Encounter for antineoplastic radiation therapy: Secondary | ICD-10-CM | POA: Diagnosis not present

## 2019-11-20 NOTE — Progress Notes (Incomplete)
  Patient Name: [name] MRN: [id number] DOB: [birth year] Referring Physician: Hubbard Robinson Date of Service: 11/14/2019 [facility], [place]                                                        End Of Treatment Note  Diagnoses: C34.11-Malignant neoplasm of upper lobe, right bronchus or lung  Cancer Staging: Clinicalstage IA3(T1c, N0, M0) non-small cell lung cancer, squamous cell carcinoma  Intent: Curative  Radiation Treatment Dates: 11/07/2019 through 11/14/2019 Site Technique Total Dose (Gy) Dose per Fx (Gy) Completed Fx Beam Energies  Lung, Right: Lung_Rt IMRT 54/54 18 3/3 6XFFF   Narrative: The patient tolerated radiation therapy relatively well. The only complaint that the patient had throughout treatment was fatigue. No other radiation-related side effects reported.  Plan: The patient will follow-up with radiation oncology in one month.  ________________________________________________   Blair Promise, PhD, MD  This document serves as a record of services personally performed by Gery Pray, MD. It was created on his behalf by Clerance Lav, a trained medical scribe. The creation of this record is based on the scribe's personal observations and the provider's statements to them. This document has been checked and approved by the attending provider.

## 2019-11-23 ENCOUNTER — Inpatient Hospital Stay: Payer: PPO | Attending: Internal Medicine

## 2019-11-23 ENCOUNTER — Other Ambulatory Visit: Payer: Self-pay

## 2019-11-23 DIAGNOSIS — Z452 Encounter for adjustment and management of vascular access device: Secondary | ICD-10-CM | POA: Insufficient documentation

## 2019-11-23 DIAGNOSIS — C3411 Malignant neoplasm of upper lobe, right bronchus or lung: Secondary | ICD-10-CM | POA: Insufficient documentation

## 2019-11-23 DIAGNOSIS — Z95828 Presence of other vascular implants and grafts: Secondary | ICD-10-CM

## 2019-11-23 MED ORDER — HEPARIN SOD (PORK) LOCK FLUSH 100 UNIT/ML IV SOLN
500.0000 [IU] | Freq: Once | INTRAVENOUS | Status: AC | PRN
  Administered 2019-11-23: 500 [IU] via INTRAVENOUS
  Filled 2019-11-23: qty 5

## 2019-11-23 MED ORDER — SODIUM CHLORIDE 0.9% FLUSH
10.0000 mL | INTRAVENOUS | Status: DC | PRN
  Administered 2019-11-23: 10 mL via INTRAVENOUS
  Filled 2019-11-23: qty 10

## 2019-11-24 DIAGNOSIS — J449 Chronic obstructive pulmonary disease, unspecified: Secondary | ICD-10-CM | POA: Diagnosis not present

## 2019-12-04 DIAGNOSIS — Z79899 Other long term (current) drug therapy: Secondary | ICD-10-CM | POA: Diagnosis not present

## 2019-12-04 DIAGNOSIS — M109 Gout, unspecified: Secondary | ICD-10-CM | POA: Diagnosis not present

## 2019-12-04 DIAGNOSIS — M5416 Radiculopathy, lumbar region: Secondary | ICD-10-CM | POA: Diagnosis not present

## 2019-12-04 DIAGNOSIS — G8929 Other chronic pain: Secondary | ICD-10-CM | POA: Diagnosis not present

## 2019-12-04 DIAGNOSIS — E7800 Pure hypercholesterolemia, unspecified: Secondary | ICD-10-CM | POA: Diagnosis not present

## 2019-12-04 DIAGNOSIS — Z1159 Encounter for screening for other viral diseases: Secondary | ICD-10-CM | POA: Diagnosis not present

## 2019-12-18 ENCOUNTER — Other Ambulatory Visit: Payer: Self-pay

## 2019-12-18 ENCOUNTER — Ambulatory Visit
Admission: RE | Admit: 2019-12-18 | Discharge: 2019-12-18 | Disposition: A | Discharge status: Home / Self Care | Payer: PPO | Source: Ambulatory Visit | Attending: Radiation Oncology | Admitting: Radiation Oncology

## 2019-12-18 ENCOUNTER — Encounter: Payer: Self-pay | Admitting: Radiation Oncology

## 2019-12-18 VITALS — BP 125/61 | HR 69 | Temp 98.5°F | Resp 18 | Ht 72.0 in | Wt 235.5 lb

## 2019-12-18 DIAGNOSIS — Z79899 Other long term (current) drug therapy: Secondary | ICD-10-CM | POA: Insufficient documentation

## 2019-12-18 DIAGNOSIS — C3411 Malignant neoplasm of upper lobe, right bronchus or lung: Secondary | ICD-10-CM

## 2019-12-18 DIAGNOSIS — R5383 Other fatigue: Secondary | ICD-10-CM | POA: Insufficient documentation

## 2019-12-18 DIAGNOSIS — Z923 Personal history of irradiation: Secondary | ICD-10-CM | POA: Insufficient documentation

## 2019-12-18 NOTE — Progress Notes (Signed)
Radiation Oncology         [phone] ________________________________  Name: [name] MRN: [id number]  Date: 12/18/2019  DOB: [birth year]  Follow-Up Visit Note  CC: [name], PA  [name], [place]    ICD-10-CM   1. Primary cancer of right upper lobe of lung (HCC)  C34.11     Diagnosis: Clinicalstage IA3(T1c, N0, M0) non-small cell lung cancer, squamous cell carcinoma  Interval Since Last Radiation: One month and six days.  Radiation Treatment Dates: 11/07/2019 through 11/14/2019 Site Technique Total Dose (Gy) Dose per Fx (Gy) Completed Fx Beam Energies  Lung, Right: Lung_Rt IMRT 54/54 18 3/3 6XFFF    Narrative:  The patient returns today for routine follow-up. No significant interval history since the end of treatment.  On review of systems, he reports some mild fatigue since his radiation therapy. He denies changes in breathing cough or hemoptysis.[name]                    ALLERGIES:  has No Known Allergies.  Meds: Current Outpatient Medications  Medication Sig Dispense Refill  . albuterol (PROVENTIL) (2.5 MG/3ML) 0.083% nebulizer solution Take 3 mLs (2.5 mg total) by nebulization every 4 (four) hours as needed for wheezing or shortness of breath. 75 mL 3  . allopurinol (ZYLOPRIM) 300 MG tablet Take 300 mg by mouth daily.    [name] amLODipine (NORVASC) 10 MG tablet Take 10 mg by mouth daily.    . betamethasone dipropionate 0.05 % lotion Apply 1 application topically 2 (two) times daily as needed (psorasis).     . finasteride (PROSCAR) 5 MG tablet Take 5 mg by mouth daily.    [name] levothyroxine (SYNTHROID, LEVOTHROID) 150 MCG tablet Take 150 mcg by mouth daily.    [name] lisinopril (PRINIVIL,ZESTRIL) 10 MG tablet Take 1 tablet (10 mg total) by mouth daily. 30 tablet 11  . omeprazole (PRILOSEC) 20 MG capsule Take 1 capsule (20 mg total) by mouth 2 (two) times daily.    [name] oxyCODONE-acetaminophen (PERCOCET) 10-325 MG tablet Take 1 tablet by mouth every 6 (six) hours as  needed for pain.     [name] PROAIR HFA 108 (90 Base) MCG/ACT inhaler Inhale 2 puffs into the lungs every 6 (six) hours as needed for wheezing or shortness of breath. 3 Inhaler 1  . sodium chloride (OCEAN) 0.65 % SOLN nasal spray Place 1 spray into both nostrils as needed for congestion.    . TRELEGY ELLIPTA 100-62.5-25 MCG/INH AEPB Inhale 1 puff into the lungs daily.     No current facility-administered medications for this encounter.    Physical Findings: The patient is in no acute distress. Patient is alert and oriented.  height is 6' (1.829 m) and weight is 235 lb 8 oz (106.8 kg). His temporal temperature is 98.5 F (36.9 C). His blood pressure is 125/61 and his pulse is 69. His respiration is 18 and oxygen saturation is 97%.   No significant changes. Lungs are clear to auscultation bilaterally. Heart has regular rate and rhythm. No palpable cervical, supraclavicular, or axillary adenopathy. Abdomen soft, non-tender, normal bowel sounds.   Lab Findings: Lab Results  Component Value Date   WBC 6.6 10/03/2019   HGB 12.7 (L) 10/03/2019   HCT 39.4 10/03/2019   MCV 90.8 10/03/2019   PLT 194.0 10/03/2019    Radiographic Findings: No results found.  Impression:  Clinicalstage IA3(T1c, N0, M0) non-small cell lung cancer, squamous cell carcinoma  Overall tolerated his radiation therapy  quite well.  No signs of radiation pneumonitis at this point.  Plan: The patient is scheduled to follow-up with Dr. Julien Nordmann on 02/15/2020. Routine follow-up with radiation oncology in three months.  A chest CT scan has been ordered by Dr. Julien Nordmann for 2 to 3 days before his follow-up appointment.  ___________________________________   Blair Promise, PhD, MD  This document serves as a record of services personally performed by Gery Pray, MD. It was created on his behalf by Clerance Lav, a trained medical scribe. The creation of this record is based on the scribe's personal observations and the provider's  statements to them. This document has been checked and approved by the attending provider.

## 2019-12-18 NOTE — Patient Instructions (Signed)
Coronavirus (COVID-19) Are you at risk?  Are you at risk for the Coronavirus (COVID-19)?  To be considered HIGH RISK for Coronavirus (COVID-19), you have to meet the following criteria:  . Traveled to [place], [place], [place], [place] or [place]; or in the [place] to [place], [place], [place], or [place]; and have fever, cough, and shortness of breath within the last 2 weeks of travel OR . Been in close contact with a person diagnosed with COVID-19 within the last 2 weeks and have fever, cough, and shortness of breath . IF YOU DO NOT MEET THESE CRITERIA, YOU ARE CONSIDERED LOW RISK FOR COVID-19.  What to do if you are HIGH RISK for COVID-19?  . If you are having a medical emergency, call 911. . Seek medical care right away. Before you go to a doctor's office, urgent care or emergency department, call ahead and tell them about your recent travel, contact with someone diagnosed with COVID-19, and your symptoms. You should receive instructions from your physician's office regarding next steps of care.  . When you arrive at healthcare provider, tell the healthcare staff immediately you have returned from visiting [place], [place], [place], [place] or [place]; or traveled in the [place] to [place], [place], [place], or [place]; in the last two weeks or you have been in close contact with a person diagnosed with COVID-19 in the last 2 weeks.   . Tell the health care staff about your symptoms: fever, cough and shortness of breath. . After you have been seen by a medical provider, you will be either: o Tested for (COVID-19) and discharged home on quarantine except to seek medical care if symptoms worsen, and asked to  - Stay home and avoid contact with others until you get your results (4-5 days)  - Avoid travel on public transportation if possible (such as bus, train, or airplane) or o Sent to the Emergency Department by EMS for evaluation, COVID-19 testing, and possible  admission depending on your condition and test results.  What to do if you are LOW RISK for COVID-19?  Reduce your risk of any infection by using the same precautions used for avoiding the common cold or flu:  . Wash your hands often with soap and warm water for at least 20 seconds.  If soap and water are not readily available, use an alcohol-based hand sanitizer with at least 60% alcohol.  . If coughing or sneezing, cover your mouth and nose by coughing or sneezing into the elbow areas of your shirt or coat, into a tissue or into your sleeve (not your hands). . Avoid shaking hands with others and consider head nods or verbal greetings only. . Avoid touching your eyes, nose, or mouth with unwashed hands.  . Avoid close contact with people who are sick. . Avoid places or events with large numbers of people in one location, like concerts or sporting events. . Carefully consider travel plans you have or are making. . If you are planning any travel outside or inside the [place], visit the CDC's Travelers' Health webpage for the latest health notices. . If you have some symptoms but not all symptoms, continue to monitor at home and seek medical attention if your symptoms worsen. . If you are having a medical emergency, call 911.   ADDITIONAL HEALTHCARE OPTIONS FOR PATIENTS  [facility] / e-Visit: [email or web address]         [facility]: [phone]  [facility]: [phone]                   [facility]: [phone]   

## 2019-12-18 NOTE — Progress Notes (Signed)
Patient in for follow up doing well denies any complaints of.

## 2019-12-19 ENCOUNTER — Telehealth: Payer: Self-pay | Admitting: *Deleted

## 2019-12-19 NOTE — Telephone Encounter (Signed)
CALLED PATIENT TO INFORM OF FU FOR 03/18/20 @ 4:15 PM, SPOKE WITH PATIENT AND HE IS AWARE OF THIS APPT.

## 2020-01-02 DIAGNOSIS — M5416 Radiculopathy, lumbar region: Secondary | ICD-10-CM | POA: Diagnosis not present

## 2020-01-02 DIAGNOSIS — Z20822 Contact with and (suspected) exposure to covid-19: Secondary | ICD-10-CM | POA: Diagnosis not present

## 2020-01-02 DIAGNOSIS — G8929 Other chronic pain: Secondary | ICD-10-CM | POA: Diagnosis not present

## 2020-01-02 DIAGNOSIS — Z79899 Other long term (current) drug therapy: Secondary | ICD-10-CM | POA: Diagnosis not present

## 2020-01-02 DIAGNOSIS — J449 Chronic obstructive pulmonary disease, unspecified: Secondary | ICD-10-CM | POA: Diagnosis not present

## 2020-01-30 DIAGNOSIS — R0602 Shortness of breath: Secondary | ICD-10-CM | POA: Diagnosis not present

## 2020-01-30 DIAGNOSIS — Z125 Encounter for screening for malignant neoplasm of prostate: Secondary | ICD-10-CM | POA: Diagnosis not present

## 2020-01-30 DIAGNOSIS — E611 Iron deficiency: Secondary | ICD-10-CM | POA: Diagnosis not present

## 2020-01-30 DIAGNOSIS — Z03818 Encounter for observation for suspected exposure to other biological agents ruled out: Secondary | ICD-10-CM | POA: Diagnosis not present

## 2020-01-30 DIAGNOSIS — Z1331 Encounter for screening for depression: Secondary | ICD-10-CM | POA: Diagnosis not present

## 2020-01-30 DIAGNOSIS — Z0000 Encounter for general adult medical examination without abnormal findings: Secondary | ICD-10-CM | POA: Diagnosis not present

## 2020-01-30 DIAGNOSIS — Z1339 Encounter for screening examination for other mental health and behavioral disorders: Secondary | ICD-10-CM | POA: Diagnosis not present

## 2020-01-30 DIAGNOSIS — G8929 Other chronic pain: Secondary | ICD-10-CM | POA: Diagnosis not present

## 2020-01-30 DIAGNOSIS — E291 Testicular hypofunction: Secondary | ICD-10-CM | POA: Diagnosis not present

## 2020-01-30 DIAGNOSIS — E7800 Pure hypercholesterolemia, unspecified: Secondary | ICD-10-CM | POA: Diagnosis not present

## 2020-01-30 DIAGNOSIS — Z113 Encounter for screening for infections with a predominantly sexual mode of transmission: Secondary | ICD-10-CM | POA: Diagnosis not present

## 2020-01-30 DIAGNOSIS — M129 Arthropathy, unspecified: Secondary | ICD-10-CM | POA: Diagnosis not present

## 2020-01-30 DIAGNOSIS — M5416 Radiculopathy, lumbar region: Secondary | ICD-10-CM | POA: Diagnosis not present

## 2020-01-30 DIAGNOSIS — E039 Hypothyroidism, unspecified: Secondary | ICD-10-CM | POA: Diagnosis not present

## 2020-01-30 DIAGNOSIS — Z79899 Other long term (current) drug therapy: Secondary | ICD-10-CM | POA: Diagnosis not present

## 2020-01-30 DIAGNOSIS — E559 Vitamin D deficiency, unspecified: Secondary | ICD-10-CM | POA: Diagnosis not present

## 2020-02-02 ENCOUNTER — Telehealth: Payer: Self-pay | Admitting: Internal Medicine

## 2020-02-02 NOTE — Telephone Encounter (Signed)
R/s per 5/14 sch message - unable to reach pt . Left message with appt date and time

## 2020-02-08 DIAGNOSIS — Z712 Person consulting for explanation of examination or test findings: Secondary | ICD-10-CM | POA: Diagnosis not present

## 2020-02-08 DIAGNOSIS — E291 Testicular hypofunction: Secondary | ICD-10-CM | POA: Diagnosis not present

## 2020-02-08 DIAGNOSIS — D649 Anemia, unspecified: Secondary | ICD-10-CM | POA: Diagnosis not present

## 2020-02-08 DIAGNOSIS — Z79899 Other long term (current) drug therapy: Secondary | ICD-10-CM | POA: Diagnosis not present

## 2020-02-08 DIAGNOSIS — E785 Hyperlipidemia, unspecified: Secondary | ICD-10-CM | POA: Diagnosis not present

## 2020-02-12 ENCOUNTER — Ambulatory Visit (HOSPITAL_COMMUNITY)
Admission: RE | Admit: 2020-02-12 | Discharge: 2020-02-12 | Disposition: A | Discharge status: Home / Self Care | Payer: PPO | Source: Ambulatory Visit | Attending: Internal Medicine | Admitting: Internal Medicine

## 2020-02-12 ENCOUNTER — Other Ambulatory Visit: Payer: Self-pay

## 2020-02-12 ENCOUNTER — Inpatient Hospital Stay: Payer: PPO | Attending: Internal Medicine

## 2020-02-12 ENCOUNTER — Inpatient Hospital Stay: Payer: PPO

## 2020-02-12 ENCOUNTER — Telehealth: Payer: Self-pay | Admitting: Medical Oncology

## 2020-02-12 DIAGNOSIS — E039 Hypothyroidism, unspecified: Secondary | ICD-10-CM | POA: Insufficient documentation

## 2020-02-12 DIAGNOSIS — E669 Obesity, unspecified: Secondary | ICD-10-CM | POA: Insufficient documentation

## 2020-02-12 DIAGNOSIS — F329 Major depressive disorder, single episode, unspecified: Secondary | ICD-10-CM | POA: Insufficient documentation

## 2020-02-12 DIAGNOSIS — C3490 Malignant neoplasm of unspecified part of unspecified bronchus or lung: Secondary | ICD-10-CM

## 2020-02-12 DIAGNOSIS — J449 Chronic obstructive pulmonary disease, unspecified: Secondary | ICD-10-CM | POA: Insufficient documentation

## 2020-02-12 DIAGNOSIS — Z79899 Other long term (current) drug therapy: Secondary | ICD-10-CM | POA: Diagnosis not present

## 2020-02-12 DIAGNOSIS — D709 Neutropenia, unspecified: Secondary | ICD-10-CM | POA: Diagnosis not present

## 2020-02-12 DIAGNOSIS — K219 Gastro-esophageal reflux disease without esophagitis: Secondary | ICD-10-CM | POA: Insufficient documentation

## 2020-02-12 DIAGNOSIS — Z923 Personal history of irradiation: Secondary | ICD-10-CM | POA: Diagnosis not present

## 2020-02-12 DIAGNOSIS — M129 Arthropathy, unspecified: Secondary | ICD-10-CM | POA: Insufficient documentation

## 2020-02-12 DIAGNOSIS — I10 Essential (primary) hypertension: Secondary | ICD-10-CM | POA: Insufficient documentation

## 2020-02-12 DIAGNOSIS — I272 Pulmonary hypertension, unspecified: Secondary | ICD-10-CM | POA: Insufficient documentation

## 2020-02-12 DIAGNOSIS — I509 Heart failure, unspecified: Secondary | ICD-10-CM | POA: Insufficient documentation

## 2020-02-12 DIAGNOSIS — Z8572 Personal history of non-Hodgkin lymphomas: Secondary | ICD-10-CM | POA: Diagnosis not present

## 2020-02-12 DIAGNOSIS — M109 Gout, unspecified: Secondary | ICD-10-CM | POA: Diagnosis not present

## 2020-02-12 DIAGNOSIS — I700 Atherosclerosis of aorta: Secondary | ICD-10-CM | POA: Insufficient documentation

## 2020-02-12 DIAGNOSIS — N400 Enlarged prostate without lower urinary tract symptoms: Secondary | ICD-10-CM | POA: Diagnosis not present

## 2020-02-12 DIAGNOSIS — C3411 Malignant neoplasm of upper lobe, right bronchus or lung: Secondary | ICD-10-CM | POA: Insufficient documentation

## 2020-02-12 LAB — CMP (CANCER CENTER ONLY)
ALT: 7 U/L (ref 0–44)
AST: 11 U/L — ABNORMAL LOW (ref 15–41)
Albumin: 3.6 g/dL (ref 3.5–5.0)
Alkaline Phosphatase: 83 U/L (ref 38–126)
Anion gap: 6 (ref 5–15)
BUN: 13 mg/dL (ref 8–23)
CO2: 31 mmol/L (ref 22–32)
Calcium: 9.1 mg/dL (ref 8.9–10.3)
Chloride: 104 mmol/L (ref 98–111)
Creatinine: 1.14 mg/dL (ref 0.61–1.24)
GFR, Est AFR Am: >60 mL/min (ref >60)
GFR, Estimated: >60 mL/min (ref >60)
Glucose, Bld: 116 mg/dL — ABNORMAL HIGH (ref 70–99)
Potassium: 5.1 mmol/L (ref 3.5–5.1)
Sodium: 141 mmol/L (ref 135–145)
Total Bilirubin: 0.6 mg/dL (ref 0.3–1.2)
Total Protein: 6.4 g/dL — ABNORMAL LOW (ref 6.5–8.1)

## 2020-02-12 LAB — CBC WITH DIFFERENTIAL (CANCER CENTER ONLY)
Abs Immature Granulocytes: 0.05 10*3/uL (ref 0.00–0.07)
Basophils Absolute: 0 10*3/uL (ref 0.0–0.1)
Basophils Relative: 0 %
Eosinophils Absolute: 0.1 10*3/uL (ref 0.0–0.5)
Eosinophils Relative: 2 %
HCT: 36.2 % — ABNORMAL LOW (ref 39.0–52.0)
Hemoglobin: 11.4 g/dL — ABNORMAL LOW (ref 13.0–17.0)
Immature Granulocytes: 1 %
Lymphocytes Relative: 13 %
Lymphs Abs: 0.9 10*3/uL (ref 0.7–4.0)
MCH: 29.1 pg (ref 26.0–34.0)
MCHC: 31.5 g/dL (ref 30.0–36.0)
MCV: 92.3 fL (ref 80.0–100.0)
Monocytes Absolute: 0.6 10*3/uL (ref 0.1–1.0)
Monocytes Relative: 8 %
Neutro Abs: 5.6 10*3/uL (ref 1.7–7.7)
Neutrophils Relative %: 76 %
Platelet Count: 167 10*3/uL (ref 150–400)
RBC: 3.92 MIL/uL — ABNORMAL LOW (ref 4.22–5.81)
RDW: 13.7 % (ref 11.5–15.5)
WBC Count: 7.3 10*3/uL (ref 4.0–10.5)
nRBC: 0 % (ref 0.0–0.2)

## 2020-02-12 MED ORDER — IOHEXOL 300 MG/ML  SOLN
75.0000 mL | Freq: Once | INTRAMUSCULAR | Status: AC | PRN
  Administered 2020-02-12: 75 mL via INTRAVENOUS

## 2020-02-12 MED ORDER — SODIUM CHLORIDE (PF) 0.9 % IJ SOLN
INTRAMUSCULAR | Status: AC
  Filled 2020-02-12: qty 50

## 2020-02-12 NOTE — Telephone Encounter (Signed)
CT -called report-"report in chart -new findings. F/u 5/27

## 2020-02-12 NOTE — Telephone Encounter (Signed)
I will discuss with him when I see him.  Thank you.

## 2020-02-13 ENCOUNTER — Other Ambulatory Visit: Payer: PPO

## 2020-02-15 ENCOUNTER — Encounter: Payer: Self-pay | Admitting: Internal Medicine

## 2020-02-15 ENCOUNTER — Other Ambulatory Visit: Payer: Self-pay

## 2020-02-15 ENCOUNTER — Inpatient Hospital Stay: Payer: PPO | Admitting: Internal Medicine

## 2020-02-15 VITALS — BP 135/65 | HR 91 | Temp 98.4°F | Resp 18 | Ht 72.0 in | Wt 233.0 lb

## 2020-02-15 DIAGNOSIS — C3411 Malignant neoplasm of upper lobe, right bronchus or lung: Secondary | ICD-10-CM | POA: Diagnosis not present

## 2020-02-15 DIAGNOSIS — C3491 Malignant neoplasm of unspecified part of right bronchus or lung: Secondary | ICD-10-CM | POA: Diagnosis not present

## 2020-02-15 DIAGNOSIS — C3490 Malignant neoplasm of unspecified part of unspecified bronchus or lung: Secondary | ICD-10-CM

## 2020-02-15 NOTE — Progress Notes (Signed)
[facility] Telephone:(336) [phone]   Fax:(336) [phone]  OFFICE PROGRESS NOTE  [name], [place] [address] [place] [address]  DIAGNOSIS:  1) stage IA (T1c, N0, M0) non-small cell lung cancer, squamous cell carcinoma diagnosed in January 2021 2) Stage III large B-cell non-Hodgkin lymphoma diagnosed in December 2016 and presented with extensive lymphadenopathy involving the neck, chest, abdomen as well as splenomegaly.   PRIOR THERAPY:  1) Systemic chemotherapy with the CHOP/Rituxan every 3 weeks with Neulasta support. Status post 6 cycles. 2) SBRT to the right upper lobe squamous cell carcinoma under the care of Dr. Dorathy Daft in February 2021.[name]  CURRENT THERAPY: Observation.   INTERVAL HISTORY: [name] 69 y.o. male returns to the clinic today for follow-up visit.  The patient is feeling fine today with no concerning complaints except for fatigue and shortness of breath with exertion.  He also has some wheezing.  He denied having any current chest pain, cough or hemoptysis.  He denied having any fever or chills.  He has no nausea, vomiting, diarrhea or constipation.  He denied having any headache or visual changes.  He tolerated the previous course of SBRT to the recurrent small cell lung cancer in the right lung fairly well.  Is here today for evaluation with repeat CT scan of the chest.  MEDICAL HISTORY: Past Medical History:  Diagnosis Date  . Arthritis   . BPH (benign prostatic hyperplasia) 2011  . Congestive heart failure ([place])   . COPD (chronic obstructive pulmonary disease) ([place])   . Depression   . Drug-induced neutropenia ([place]) 10/08/2015  . Encounter for antineoplastic chemotherapy 10/08/2015  . Gastrointestinal obstruction (HCC)    Ileus  . GERD (gastroesophageal reflux disease)   . Gout   . Hx of colonoscopy   . Hypertension   . Hypothyroid   . Lung cancer ([place]) 2011  . NHL (non-Hodgkin's lymphoma) ([place]) 09/25/2015  . Obesity   .  Polycythemia   . Pulmonary hypertension ([place])   . Sepsis ([place]) 01/14/2016  . Streptococcal pneumonia [facility]) August 2009    ALLERGIES:  has No Known Allergies.  MEDICATIONS:  Current Outpatient Medications  Medication Sig Dispense Refill  . albuterol (PROVENTIL) (2.5 MG/3ML) 0.083% nebulizer solution Take 3 mLs (2.5 mg total) by nebulization every 4 (four) hours as needed for wheezing or shortness of breath. 75 mL 3  . allopurinol (ZYLOPRIM) 300 MG tablet Take 300 mg by mouth daily.    [name] amLODipine (NORVASC) 10 MG tablet Take 10 mg by mouth daily.    . betamethasone dipropionate 0.05 % lotion Apply 1 application topically 2 (two) times daily as needed (psorasis).     . finasteride (PROSCAR) 5 MG tablet Take 5 mg by mouth daily.    [name] levothyroxine (SYNTHROID, LEVOTHROID) 150 MCG tablet Take 150 mcg by mouth daily.    [name] lisinopril (PRINIVIL,ZESTRIL) 10 MG tablet Take 1 tablet (10 mg total) by mouth daily. 30 tablet 11  . omeprazole (PRILOSEC) 20 MG capsule Take 1 capsule (20 mg total) by mouth 2 (two) times daily.    [name] oxyCODONE-acetaminophen (PERCOCET) 10-325 MG tablet Take 1 tablet by mouth every 6 (six) hours as needed for pain.     [name] PROAIR HFA 108 (90 Base) MCG/ACT inhaler Inhale 2 puffs into the lungs every 6 (six) hours as needed for wheezing or shortness of breath. 3 Inhaler 1  . sodium chloride (OCEAN) 0.65 % SOLN nasal spray Place 1  spray into both nostrils as needed for congestion.    . TRELEGY ELLIPTA 100-62.5-25 MCG/INH AEPB Inhale 1 puff into the lungs daily.     No current facility-administered medications for this visit.    SURGICAL HISTORY:  Past Surgical History:  Procedure Laterality Date  . APPENDECTOMY    . BACK SURGERY    . CARDIOVERSION  11/27/2011   Procedure: CARDIOVERSION;  Surgeon: Thayer Headings, MD;  Location: [facility] ENDOSCOPY;  Service: Cardiovascular;  Laterality: N/A;  . COLONOSCOPY    . FUDUCIAL PLACEMENT Right 10/13/2019   Procedure: Placement Of Fuducial;   Surgeon: Collene Gobble, MD;  Location: [facility] OR;  Service: Thoracic;  Laterality: Right;  Right Upper Lobe   . LUNG CANCER SURGERY  2011  . UPPER GI ENDOSCOPY    . VIDEO BRONCHOSCOPY WITH ENDOBRONCHIAL NAVIGATION N/A 10/13/2019   Procedure: VIDEO BRONCHOSCOPY WITH ENDOBRONCHIAL NAVIGATION;  Surgeon: Collene Gobble, MD;  Location: [facility] OR;  Service: Thoracic;  Laterality: N/A;  . VIDEO BRONCHOSCOPY WITH ENDOBRONCHIAL ULTRASOUND N/A 10/13/2019   Procedure: VIDEO BRONCHOSCOPY WITH ENDOBRONCHIAL ULTRASOUND;  Surgeon: Collene Gobble, MD;  Location: [facility] OR;  Service: Thoracic;  Laterality: N/A;    REVIEW OF SYSTEMS:  A comprehensive review of systems was negative except for: Constitutional: positive for fatigue Respiratory: positive for dyspnea on exertion and wheezing   PHYSICAL EXAMINATION: General appearance: alert, cooperative, appears stated age, fatigued and no distress Head: Normocephalic, without obvious abnormality, atraumatic Neck: no adenopathy, no JVD, supple, symmetrical, trachea midline and thyroid not enlarged, symmetric, no tenderness/mass/nodules Lymph nodes: Cervical, supraclavicular, and axillary nodes normal. Resp: clear to auscultation bilaterally Back: symmetric, no curvature. ROM normal. No CVA tenderness. Cardio: regular rate and rhythm, S1, S2 normal, no murmur, click, rub or gallop GI: soft, non-tender; bowel sounds normal; no masses,  no organomegaly Extremities: extremities normal, atraumatic, no cyanosis or edema  ECOG PERFORMANCE STATUS: 1 - Symptomatic but completely ambulatory  Blood pressure 135/65, pulse 91, temperature 98.4 F (36.9 C), temperature source Temporal, resp. rate 18, height 6' (1.829 m), weight 233 lb (105.7 kg), SpO2 99 %.  LABORATORY DATA: Lab Results  Component Value Date   WBC 7.3 02/12/2020   HGB 11.4 (L) 02/12/2020   HCT 36.2 (L) 02/12/2020   MCV 92.3 02/12/2020   PLT 167 02/12/2020      Chemistry      Component Value Date/Time   NA  141 02/12/2020 1002   NA 140 09/07/2017 1322   K 5.1 02/12/2020 1002   K 5.2 No visable hemolysis (H) 09/07/2017 1322   CL 104 02/12/2020 1002   CO2 31 02/12/2020 1002   CO2 33 (H) 09/07/2017 1322   BUN 13 02/12/2020 1002   BUN 13.6 09/07/2017 1322   CREATININE 1.14 02/12/2020 1002   CREATININE 1.0 09/07/2017 1322      Component Value Date/Time   CALCIUM 9.1 02/12/2020 1002   CALCIUM 9.1 09/07/2017 1322   ALKPHOS 83 02/12/2020 1002   ALKPHOS 92 09/07/2017 1322   AST 11 (L) 02/12/2020 1002   AST 11 09/07/2017 1322   ALT 7 02/12/2020 1002   ALT 8 09/07/2017 1322   BILITOT 0.6 02/12/2020 1002   BILITOT 0.47 09/07/2017 1322       RADIOGRAPHIC STUDIES: CT Chest W Contrast  Result Date: 02/12/2020 CLINICAL DATA:  Non-small cell lung staging, history of radiation therapy. Also with remote history of lymphoma EXAM: CT CHEST WITH CONTRAST TECHNIQUE: Multidetector CT imaging of the chest was performed  during intravenous contrast administration. CONTRAST:  64mL OMNIPAQUE IOHEXOL 300 MG/ML  SOLN COMPARISON:  10/05/2019 FINDINGS: Cardiovascular: Calcified atheromatous plaque and noncalcified plaque in the thoracic aorta. No aneurysmal dilation. No change from previous imaging. Engorgement of central pulmonary vasculature up to 4 cm similar to the previous study. RIGHT Port-A-Cath terminates in the distal superior vena cava entering via IJ approach. Mediastinum/Nodes: Thoracic inlet structures are unremarkable. No axillary lymphadenopathy. RIGHT hilar lymph node with enlargement (image 94, series 2) this appears new when compared to previous imaging 1.6 cm short axis. (Image 86, series 2) Lungs/Pleura: Interval development of diffuse ground-glass and septal thickening in the RIGHT upper lobe. Also with small RIGHT effusion along the superior margin of the major fissure, mildly loculated appearing fissural fluid RIGHT upper lobe nodule no longer measurable as a discrete area with some areas of opacity  at the periphery of the RIGHT chest having developed since the previous study, for instance on image 60 of series 5 2.5 x 1.2 cm area. Small nodule along the pleural surface in the RIGHT lower lobe (image 123, series 5) 6 mm. Material within the RIGHT mainstem bronchus. Mild pleural thickening in the LEFT chest (image 119, series 5) approximately 6 mm greatest thickness unchanged compared to previous imaging. LEFT upper lobe scarring similar to prior study. Pleural plaques with calcification in the RIGHT chest as before. Upper Abdomen: Incidental imaging of upper abdominal contents with similar appearance of renal and splenic cystic lesions and nonobstructing RIGHT upper pole intrarenal calculi. Musculoskeletal: No acute musculoskeletal process. No destructive bone process. IMPRESSION: 1. Interval development of diffuse ground-glass and septal thickening in the RIGHT upper lobe. Potentially rib related to post treatment changes 2. Mild bronchial wall thickening and material and RIGHT mainstem bronchus. Correlate with any respiratory symptoms or risk for aspiration. 3. Pleural fluid and or thickening along the fissure in the RIGHT chest may be reactive following radiation but correlate with any respiratory symptoms or recent infection and suggest attention on follow-up. 4. RIGHT hilar nodal enlargement and nodular opacities along the periphery of the RIGHT upper lobe, also potentially reactive and or treatment related though given the nodal enlargement would consider short interval follow-up or PET for further assessment. 5. Small 6 mm nodule along the pleural surface in the RIGHT lower lobe, new since previous imaging, attention on follow-up. 6. Stable mild pleural thickening in the LEFT chest associated with findings of partial lung resection. 7. Engorgement of central pulmonary vasculature up to 4 cm, as before. Suggests pulmonary arterial hypertension. 8. Aortic atherosclerosis. Aortic Atherosclerosis  (ICD10-I70.0). Electronically Signed   By: Zetta Bills M.D.   On: 02/12/2020 12:10    ASSESSMENT AND PLAN:  This is a very pleasant 69 years old white male with 1) stage IIIa large B-cell non-Hodgkin lymphoma status post 6 cycles of systemic chemotherapy with CHOP/Rituxan with almost complete response. The patient has been on observation for several years. 2) stage Ia (T1c, N0, M0) non-small cell lung cancer, squamous cell carcinoma presented with right upper lobe pulmonary mass.  This was found on repeat imaging studies with CT scan of the chest as well as a PET scan that showed hypermetabolic activity in the right upper lobe lung mass with no evidence for lymphadenopathy or distant metastatic disease. The patient underwent SBRT to the recurrent non-small cell lung cancer, squamous cell carcinoma of the right upper lobe.  He tolerated the procedure well. He had repeat CT scan of the chest performed recently.  I personally and  independently reviewed the scan images and discussed the results with the patient today. His scan showed no concerning findings for disease progression except for very small 6 mm nodule along the pleural surface of the right lower lobe that need close monitoring. I recommended for the patient to continue on observation with repeat CT scan of the chest in 4 months for restaging of his disease. He was advised to call immediately if he has any concerning symptoms in the interval.  The patient voices understanding of current disease status and treatment options and is in agreement with the current care plan. All questions were answered. The patient knows to call the clinic with any problems, questions or concerns. We can certainly see the patient much sooner if necessary.  Disclaimer: This note was dictated with voice recognition software. Similar sounding words can inadvertently be transcribed and may not be corrected upon review.

## 2020-02-27 ENCOUNTER — Telehealth: Payer: Self-pay | Admitting: Internal Medicine

## 2020-02-27 NOTE — Telephone Encounter (Signed)
Scheduled per los. Called and left msg. Mailed printout  °

## 2020-02-28 DIAGNOSIS — J329 Chronic sinusitis, unspecified: Secondary | ICD-10-CM | POA: Diagnosis not present

## 2020-02-28 DIAGNOSIS — Z20822 Contact with and (suspected) exposure to covid-19: Secondary | ICD-10-CM | POA: Diagnosis not present

## 2020-02-28 DIAGNOSIS — R05 Cough: Secondary | ICD-10-CM | POA: Diagnosis not present

## 2020-02-28 DIAGNOSIS — R0602 Shortness of breath: Secondary | ICD-10-CM | POA: Diagnosis not present

## 2020-02-28 DIAGNOSIS — Z79899 Other long term (current) drug therapy: Secondary | ICD-10-CM | POA: Diagnosis not present

## 2020-03-06 DIAGNOSIS — M5416 Radiculopathy, lumbar region: Secondary | ICD-10-CM | POA: Diagnosis not present

## 2020-03-06 DIAGNOSIS — G8929 Other chronic pain: Secondary | ICD-10-CM | POA: Diagnosis not present

## 2020-03-06 DIAGNOSIS — Z20822 Contact with and (suspected) exposure to covid-19: Secondary | ICD-10-CM | POA: Diagnosis not present

## 2020-03-06 DIAGNOSIS — Z79899 Other long term (current) drug therapy: Secondary | ICD-10-CM | POA: Diagnosis not present

## 2020-03-18 ENCOUNTER — Other Ambulatory Visit: Payer: Self-pay

## 2020-03-18 ENCOUNTER — Ambulatory Visit
Admission: RE | Admit: 2020-03-18 | Discharge: 2020-03-18 | Disposition: A | Discharge status: Home / Self Care | Payer: PPO | Source: Ambulatory Visit | Attending: Radiation Oncology | Admitting: Radiation Oncology

## 2020-03-18 ENCOUNTER — Encounter: Payer: Self-pay | Admitting: Radiation Oncology

## 2020-03-18 DIAGNOSIS — R590 Localized enlarged lymph nodes: Secondary | ICD-10-CM | POA: Diagnosis not present

## 2020-03-18 DIAGNOSIS — R918 Other nonspecific abnormal finding of lung field: Secondary | ICD-10-CM | POA: Insufficient documentation

## 2020-03-18 DIAGNOSIS — Z08 Encounter for follow-up examination after completed treatment for malignant neoplasm: Secondary | ICD-10-CM | POA: Diagnosis not present

## 2020-03-18 DIAGNOSIS — C3491 Malignant neoplasm of unspecified part of right bronchus or lung: Secondary | ICD-10-CM

## 2020-03-18 DIAGNOSIS — Z923 Personal history of irradiation: Secondary | ICD-10-CM | POA: Diagnosis not present

## 2020-03-18 DIAGNOSIS — Z79899 Other long term (current) drug therapy: Secondary | ICD-10-CM | POA: Insufficient documentation

## 2020-03-18 DIAGNOSIS — R911 Solitary pulmonary nodule: Secondary | ICD-10-CM | POA: Diagnosis not present

## 2020-03-18 DIAGNOSIS — Z85118 Personal history of other malignant neoplasm of bronchus and lung: Secondary | ICD-10-CM | POA: Diagnosis not present

## 2020-03-18 NOTE — Progress Notes (Signed)
Patient here for a 3 month f/u visit. He denies pain or fatigue. He denies problems with shortness of breath or hemoptysis. He feels he is just about back to what is normal for him.

## 2020-03-18 NOTE — Progress Notes (Signed)
Radiation Oncology         [phone] ________________________________  Name: [name] MRN: [id number]  Date: 03/18/2020  DOB: Dec 08, 1950  Follow-Up Visit Note  CC: [name], PA  [name], [place]    ICD-10-CM   1. Squamous cell carcinoma of right lung (HCC)  C34.91     Diagnosis: Clinicalstage IA3(T1c, N0, M0) non-small cell lung cancer, squamous cell carcinoma  Interval Since Last Radiation: Four months and five days.  Radiation Treatment Dates: 11/07/2019 through 11/14/2019 Site Technique Total Dose (Gy) Dose per Fx (Gy) Completed Fx Beam Energies  Lung, Right: Lung_Rt IMRT 54/54 18 3/3 6XFFF    Narrative:  The patient returns today for routine follow-up. Since his last visit, he underwent a chest CT scan on 02/12/2020 that showed interval development of diffuse ground-glass and septal thickening in the right upper lobe; possibly right-related to post-treatment changes. It also showed mild bronchial wall thickening and material and right mainstem bronchus in addition to pleural fluid and/or thickening along the fissure in the right chest that may have been reactive following radiation. Additionally, there was right hilar nodal enlargement and nodular opacities along the periphery of the right upper lobe that were also potentially reactive and/or treatment-related. Finally, there was a new small 6 mm nodule along the pleural surface in the right lower lobe, stable mild pleural thickening in the left chest associated with findings of partial lung resection, aortic atherosclerosis, and engorgement of central pulmonary vasculature up to 4 cm.   He was seen by Dr. Julien Nordmann on 02/15/2020, during which time it was recommended that he continue on observation with repeat CT scan of the chest in four months for restaging of his disease.  On review of systems, he reports feeling well and has no complaints. He denies pain, fatigue, shortness of breath, and hemoptysis.                    ALLERGIES:  has No Known Allergies.  Meds: Current Outpatient Medications  Medication Sig Dispense Refill  . albuterol (PROVENTIL) (2.5 MG/3ML) 0.083% nebulizer solution Take 3 mLs (2.5 mg total) by nebulization every 4 (four) hours as needed for wheezing or shortness of breath. 75 mL 3  . allopurinol (ZYLOPRIM) 300 MG tablet Take 300 mg by mouth daily.    [name] amLODipine (NORVASC) 10 MG tablet Take 10 mg by mouth daily.    [name] amLODipine (NORVASC) 5 MG tablet Take 5 mg by mouth daily.    . betamethasone dipropionate 0.05 % lotion Apply 1 application topically 2 (two) times daily as needed (psorasis).     . finasteride (PROSCAR) 5 MG tablet Take 5 mg by mouth daily.    [name] levothyroxine (SYNTHROID, LEVOTHROID) 150 MCG tablet Take 150 mcg by mouth daily.    [name] lisinopril (PRINIVIL,ZESTRIL) 10 MG tablet Take 1 tablet (10 mg total) by mouth daily. 30 tablet 11  . omeprazole (PRILOSEC) 20 MG capsule Take 1 capsule (20 mg total) by mouth 2 (two) times daily.    [name] oxyCODONE-acetaminophen (PERCOCET) 10-325 MG tablet Take 1 tablet by mouth every 6 (six) hours as needed for pain.     [name] PROAIR HFA 108 (90 Base) MCG/ACT inhaler Inhale 2 puffs into the lungs every 6 (six) hours as needed for wheezing or shortness of breath. 3 Inhaler 1  . sodium chloride (OCEAN) 0.65 % SOLN nasal spray Place 1 spray into both nostrils as needed for congestion.    Viviana Simpler  ELLIPTA 100-62.5-25 MCG/INH AEPB Inhale 1 puff into the lungs daily.     No current facility-administered medications for this encounter.    Physical Findings: The patient is in no acute distress. Patient is alert and oriented.  height is 6' (1.829 m) and weight is 228 lb 6.4 oz (103.6 kg). His temperature is 98 F (36.7 C). His blood pressure is 129/59 (abnormal) and his pulse is 73. His respiration is 20 and oxygen saturation is 99%.   No significant changes. Lungs are clear to auscultation bilaterally. Heart has regular rate and rhythm. No  palpable cervical, supraclavicular, or axillary adenopathy. Abdomen soft, non-tender, normal bowel sounds.   Lab Findings: Lab Results  Component Value Date   WBC 7.3 02/12/2020   HGB 11.4 (L) 02/12/2020   HCT 36.2 (L) 02/12/2020   MCV 92.3 02/12/2020   PLT 167 02/12/2020    Radiographic Findings: No results found.  Impression:  Clinicalstage IA3(T1c, N0, M0) non-small cell lung cancer, squamous cell carcinoma  No clinical evidence of recurrence today.  Recent chest CT scans shows no active disease at the treatment site.  A peripheral nodule in the right lower lung will require follow-up  Plan: The patient will undergo a chest CT scan in three months and then follow-up with Dr. Julien Nordmann shortly after. He will follow-up with radiation oncology in as-needed basis.   Total time spent in this encounter was 15 minutes which included reviewing the patient's most recent follow-up, CT scan, physical examination, and documentation.  ___________________________________   Blair Promise, PhD, MD  This document serves as a record of services personally performed by Gery Pray, MD. It was created on his behalf by Clerance Lav, a trained medical scribe. The creation of this record is based on the scribe's personal observations and the provider's statements to them. This document has been checked and approved by the attending provider.

## 2020-03-20 DIAGNOSIS — Z1159 Encounter for screening for other viral diseases: Secondary | ICD-10-CM | POA: Diagnosis not present

## 2020-03-20 DIAGNOSIS — I10 Essential (primary) hypertension: Secondary | ICD-10-CM | POA: Diagnosis not present

## 2020-03-20 DIAGNOSIS — M5416 Radiculopathy, lumbar region: Secondary | ICD-10-CM | POA: Diagnosis not present

## 2020-03-20 DIAGNOSIS — Z79899 Other long term (current) drug therapy: Secondary | ICD-10-CM | POA: Diagnosis not present

## 2020-03-20 DIAGNOSIS — G8929 Other chronic pain: Secondary | ICD-10-CM | POA: Diagnosis not present

## 2020-04-03 DIAGNOSIS — G8929 Other chronic pain: Secondary | ICD-10-CM | POA: Diagnosis not present

## 2020-04-03 DIAGNOSIS — Z79899 Other long term (current) drug therapy: Secondary | ICD-10-CM | POA: Diagnosis not present

## 2020-04-03 DIAGNOSIS — M5416 Radiculopathy, lumbar region: Secondary | ICD-10-CM | POA: Diagnosis not present

## 2020-04-03 DIAGNOSIS — Z20822 Contact with and (suspected) exposure to covid-19: Secondary | ICD-10-CM | POA: Diagnosis not present

## 2020-04-03 DIAGNOSIS — R0989 Other specified symptoms and signs involving the circulatory and respiratory systems: Secondary | ICD-10-CM | POA: Diagnosis not present

## 2020-04-03 DIAGNOSIS — R0600 Dyspnea, unspecified: Secondary | ICD-10-CM | POA: Diagnosis not present

## 2020-04-08 ENCOUNTER — Inpatient Hospital Stay: Payer: PPO | Attending: Internal Medicine

## 2020-04-08 ENCOUNTER — Other Ambulatory Visit: Payer: Self-pay

## 2020-04-08 DIAGNOSIS — Z452 Encounter for adjustment and management of vascular access device: Secondary | ICD-10-CM | POA: Diagnosis not present

## 2020-04-08 DIAGNOSIS — C3411 Malignant neoplasm of upper lobe, right bronchus or lung: Secondary | ICD-10-CM | POA: Diagnosis not present

## 2020-04-08 DIAGNOSIS — Z95828 Presence of other vascular implants and grafts: Secondary | ICD-10-CM

## 2020-04-08 MED ORDER — SODIUM CHLORIDE 0.9% FLUSH
10.0000 mL | INTRAVENOUS | Status: DC | PRN
  Administered 2020-04-08: 10 mL via INTRAVENOUS
  Filled 2020-04-08: qty 10

## 2020-04-08 MED ORDER — HEPARIN SOD (PORK) LOCK FLUSH 100 UNIT/ML IV SOLN
500.0000 [IU] | Freq: Once | INTRAVENOUS | Status: AC | PRN
  Administered 2020-04-08: 500 [IU] via INTRAVENOUS
  Filled 2020-04-08: qty 5

## 2020-04-11 ENCOUNTER — Telehealth: Payer: Self-pay | Admitting: Pulmonary Disease

## 2020-04-11 NOTE — Telephone Encounter (Signed)
[name] phone number is [phone]. Left message letting her know that I was going to fax over last OV notes. Nothing further needed at this time.

## 2020-04-17 ENCOUNTER — Telehealth: Payer: Self-pay | Admitting: Pulmonary Disease

## 2020-04-17 NOTE — Telephone Encounter (Signed)
Pt last seen in office 10/28/2018. At that visit, pt was on room air and it is documented that pt uses 2L O2 for nocturnal oxygen.  Called [employer] to speak with Raquel Sarna but unable to reach. Left message for her to return call.

## 2020-04-18 NOTE — Telephone Encounter (Signed)
LMTCB x2 for [name] with [employer]. Pt will need an OV.

## 2020-04-22 NOTE — Telephone Encounter (Signed)
[place] [phone]  Ext [phone] LMTCB to see if she was still needing office notes from office.  Patient's LOV was 10/03/2019 with Dr. Lamonte Sakai but there is no mention of patient using oxygen in notes. Patient will need an OV if he still needs oxygen.

## 2020-04-23 NOTE — Telephone Encounter (Signed)
We have attempted to contact Raquel Sarna with [employer] several times with no success or call back from her. Per triage protocol, message will be closed.

## 2020-05-02 DIAGNOSIS — Z1159 Encounter for screening for other viral diseases: Secondary | ICD-10-CM | POA: Diagnosis not present

## 2020-05-02 DIAGNOSIS — G8929 Other chronic pain: Secondary | ICD-10-CM | POA: Diagnosis not present

## 2020-05-02 DIAGNOSIS — E291 Testicular hypofunction: Secondary | ICD-10-CM | POA: Diagnosis not present

## 2020-05-02 DIAGNOSIS — Z79899 Other long term (current) drug therapy: Secondary | ICD-10-CM | POA: Diagnosis not present

## 2020-05-02 DIAGNOSIS — E611 Iron deficiency: Secondary | ICD-10-CM | POA: Diagnosis not present

## 2020-05-02 DIAGNOSIS — E7800 Pure hypercholesterolemia, unspecified: Secondary | ICD-10-CM | POA: Diagnosis not present

## 2020-05-02 DIAGNOSIS — E559 Vitamin D deficiency, unspecified: Secondary | ICD-10-CM | POA: Diagnosis not present

## 2020-05-02 DIAGNOSIS — M5416 Radiculopathy, lumbar region: Secondary | ICD-10-CM | POA: Diagnosis not present

## 2020-05-21 DIAGNOSIS — Z20822 Contact with and (suspected) exposure to covid-19: Secondary | ICD-10-CM | POA: Diagnosis not present

## 2020-05-21 DIAGNOSIS — M5416 Radiculopathy, lumbar region: Secondary | ICD-10-CM | POA: Diagnosis not present

## 2020-05-21 DIAGNOSIS — Z79899 Other long term (current) drug therapy: Secondary | ICD-10-CM | POA: Diagnosis not present

## 2020-05-21 DIAGNOSIS — G8929 Other chronic pain: Secondary | ICD-10-CM | POA: Diagnosis not present

## 2020-05-21 DIAGNOSIS — Z1159 Encounter for screening for other viral diseases: Secondary | ICD-10-CM | POA: Diagnosis not present

## 2020-05-21 DIAGNOSIS — J449 Chronic obstructive pulmonary disease, unspecified: Secondary | ICD-10-CM | POA: Diagnosis not present

## 2020-06-03 ENCOUNTER — Inpatient Hospital Stay: Payer: PPO | Attending: Internal Medicine

## 2020-06-03 ENCOUNTER — Other Ambulatory Visit: Payer: Self-pay

## 2020-06-03 DIAGNOSIS — Z79899 Other long term (current) drug therapy: Secondary | ICD-10-CM | POA: Diagnosis not present

## 2020-06-03 DIAGNOSIS — I509 Heart failure, unspecified: Secondary | ICD-10-CM | POA: Insufficient documentation

## 2020-06-03 DIAGNOSIS — Z9221 Personal history of antineoplastic chemotherapy: Secondary | ICD-10-CM | POA: Diagnosis not present

## 2020-06-03 DIAGNOSIS — M109 Gout, unspecified: Secondary | ICD-10-CM | POA: Diagnosis not present

## 2020-06-03 DIAGNOSIS — I270 Primary pulmonary hypertension: Secondary | ICD-10-CM | POA: Insufficient documentation

## 2020-06-03 DIAGNOSIS — K219 Gastro-esophageal reflux disease without esophagitis: Secondary | ICD-10-CM | POA: Insufficient documentation

## 2020-06-03 DIAGNOSIS — C3411 Malignant neoplasm of upper lobe, right bronchus or lung: Secondary | ICD-10-CM | POA: Insufficient documentation

## 2020-06-03 DIAGNOSIS — E039 Hypothyroidism, unspecified: Secondary | ICD-10-CM | POA: Diagnosis not present

## 2020-06-03 DIAGNOSIS — J449 Chronic obstructive pulmonary disease, unspecified: Secondary | ICD-10-CM | POA: Diagnosis not present

## 2020-06-03 DIAGNOSIS — Z95828 Presence of other vascular implants and grafts: Secondary | ICD-10-CM

## 2020-06-03 DIAGNOSIS — M129 Arthropathy, unspecified: Secondary | ICD-10-CM | POA: Insufficient documentation

## 2020-06-03 DIAGNOSIS — I700 Atherosclerosis of aorta: Secondary | ICD-10-CM | POA: Diagnosis not present

## 2020-06-03 DIAGNOSIS — E669 Obesity, unspecified: Secondary | ICD-10-CM | POA: Diagnosis not present

## 2020-06-03 DIAGNOSIS — Z8572 Personal history of non-Hodgkin lymphomas: Secondary | ICD-10-CM | POA: Insufficient documentation

## 2020-06-03 DIAGNOSIS — N400 Enlarged prostate without lower urinary tract symptoms: Secondary | ICD-10-CM | POA: Insufficient documentation

## 2020-06-03 DIAGNOSIS — G620 Drug-induced polyneuropathy: Secondary | ICD-10-CM | POA: Diagnosis not present

## 2020-06-03 MED ORDER — HEPARIN SOD (PORK) LOCK FLUSH 100 UNIT/ML IV SOLN
500.0000 [IU] | Freq: Once | INTRAVENOUS | Status: AC | PRN
  Administered 2020-06-03: 500 [IU] via INTRAVENOUS
  Filled 2020-06-03: qty 5

## 2020-06-03 MED ORDER — SODIUM CHLORIDE 0.9% FLUSH
10.0000 mL | INTRAVENOUS | Status: DC | PRN
  Administered 2020-06-03: 10 mL via INTRAVENOUS
  Filled 2020-06-03: qty 10

## 2020-06-03 NOTE — Patient Instructions (Signed)

## 2020-06-11 DIAGNOSIS — Z1159 Encounter for screening for other viral diseases: Secondary | ICD-10-CM | POA: Diagnosis not present

## 2020-06-11 DIAGNOSIS — J449 Chronic obstructive pulmonary disease, unspecified: Secondary | ICD-10-CM | POA: Diagnosis not present

## 2020-06-11 DIAGNOSIS — M5416 Radiculopathy, lumbar region: Secondary | ICD-10-CM | POA: Diagnosis not present

## 2020-06-11 DIAGNOSIS — Z20822 Contact with and (suspected) exposure to covid-19: Secondary | ICD-10-CM | POA: Diagnosis not present

## 2020-06-11 DIAGNOSIS — Z79899 Other long term (current) drug therapy: Secondary | ICD-10-CM | POA: Diagnosis not present

## 2020-06-11 DIAGNOSIS — G8929 Other chronic pain: Secondary | ICD-10-CM | POA: Diagnosis not present

## 2020-06-14 ENCOUNTER — Inpatient Hospital Stay: Payer: PPO

## 2020-06-14 ENCOUNTER — Other Ambulatory Visit: Payer: Self-pay

## 2020-06-14 ENCOUNTER — Other Ambulatory Visit: Payer: Self-pay | Admitting: Internal Medicine

## 2020-06-14 ENCOUNTER — Telehealth: Payer: Self-pay | Admitting: Emergency Medicine

## 2020-06-14 ENCOUNTER — Ambulatory Visit (HOSPITAL_COMMUNITY)
Admission: RE | Admit: 2020-06-14 | Discharge: 2020-06-14 | Disposition: A | Discharge status: Home / Self Care | Payer: PPO | Source: Ambulatory Visit | Attending: Internal Medicine | Admitting: Internal Medicine

## 2020-06-14 DIAGNOSIS — I2510 Atherosclerotic heart disease of native coronary artery without angina pectoris: Secondary | ICD-10-CM | POA: Diagnosis not present

## 2020-06-14 DIAGNOSIS — C3411 Malignant neoplasm of upper lobe, right bronchus or lung: Secondary | ICD-10-CM | POA: Diagnosis not present

## 2020-06-14 DIAGNOSIS — C3490 Malignant neoplasm of unspecified part of unspecified bronchus or lung: Secondary | ICD-10-CM | POA: Insufficient documentation

## 2020-06-14 DIAGNOSIS — I10 Essential (primary) hypertension: Secondary | ICD-10-CM | POA: Diagnosis not present

## 2020-06-14 DIAGNOSIS — I700 Atherosclerosis of aorta: Secondary | ICD-10-CM | POA: Diagnosis not present

## 2020-06-14 LAB — CMP (CANCER CENTER ONLY)
ALT: 18 U/L (ref 0–44)
AST: 17 U/L (ref 15–41)
Albumin: 3.6 g/dL (ref 3.5–5.0)
Alkaline Phosphatase: 102 U/L (ref 38–126)
Anion gap: 6 (ref 5–15)
BUN: 13 mg/dL (ref 8–23)
CO2: 30 mmol/L (ref 22–32)
Calcium: 9.3 mg/dL (ref 8.9–10.3)
Chloride: 104 mmol/L (ref 98–111)
Creatinine: 1.01 mg/dL (ref 0.61–1.24)
GFR, Est AFR Am: >60 mL/min (ref >60)
GFR, Estimated: >60 mL/min (ref >60)
Glucose, Bld: 111 mg/dL — ABNORMAL HIGH (ref 70–99)
Potassium: 4.7 mmol/L (ref 3.5–5.1)
Sodium: 140 mmol/L (ref 135–145)
Total Bilirubin: 0.5 mg/dL (ref 0.3–1.2)
Total Protein: 6.5 g/dL (ref 6.5–8.1)

## 2020-06-14 LAB — CBC WITH DIFFERENTIAL (CANCER CENTER ONLY)
Abs Immature Granulocytes: 0.02 10*3/uL (ref 0.00–0.07)
Basophils Absolute: 0 10*3/uL (ref 0.0–0.1)
Basophils Relative: 1 %
Eosinophils Absolute: 0.2 10*3/uL (ref 0.0–0.5)
Eosinophils Relative: 3 %
HCT: 37.1 % — ABNORMAL LOW (ref 39.0–52.0)
Hemoglobin: 11.7 g/dL — ABNORMAL LOW (ref 13.0–17.0)
Immature Granulocytes: 0 %
Lymphocytes Relative: 19 %
Lymphs Abs: 1 10*3/uL (ref 0.7–4.0)
MCH: 28 pg (ref 26.0–34.0)
MCHC: 31.5 g/dL (ref 30.0–36.0)
MCV: 88.8 fL (ref 80.0–100.0)
Monocytes Absolute: 0.4 10*3/uL (ref 0.1–1.0)
Monocytes Relative: 7 %
Neutro Abs: 4 10*3/uL (ref 1.7–7.7)
Neutrophils Relative %: 70 %
Platelet Count: 173 10*3/uL (ref 150–400)
RBC: 4.18 MIL/uL — ABNORMAL LOW (ref 4.22–5.81)
RDW: 14 % (ref 11.5–15.5)
WBC Count: 5.6 10*3/uL (ref 4.0–10.5)
nRBC: 0 % (ref 0.0–0.2)

## 2020-06-14 NOTE — Telephone Encounter (Signed)
Call from [facility] in [place] requesting ok to proceed with CT with contrast today.  Pt reports that last time he had a scan with contrast he felt flushed and experienced a rash on his body afterwards.  No record of this issue found in pt's chart, no allergies on file.  Pt has not had any pre-medications today.  MD Mohamed unavailable, spoke with PA Cassie who agreed to do CT WITHOUT contrast today so as not to delay pt's tx plan.  Megan w/CT made aware to proceed with CT today but without contrast.

## 2020-06-17 ENCOUNTER — Inpatient Hospital Stay: Payer: PPO | Admitting: Internal Medicine

## 2020-06-17 ENCOUNTER — Encounter: Payer: Self-pay | Admitting: Internal Medicine

## 2020-06-17 ENCOUNTER — Other Ambulatory Visit: Payer: Self-pay

## 2020-06-17 VITALS — BP 117/58 | HR 60 | Temp 98.0°F | Resp 16 | Ht 72.0 in | Wt 222.5 lb

## 2020-06-17 DIAGNOSIS — C3411 Malignant neoplasm of upper lobe, right bronchus or lung: Secondary | ICD-10-CM | POA: Diagnosis not present

## 2020-06-17 DIAGNOSIS — C3491 Malignant neoplasm of unspecified part of right bronchus or lung: Secondary | ICD-10-CM | POA: Diagnosis not present

## 2020-06-17 DIAGNOSIS — C3490 Malignant neoplasm of unspecified part of unspecified bronchus or lung: Secondary | ICD-10-CM | POA: Diagnosis not present

## 2020-06-17 NOTE — Progress Notes (Signed)
[name]:(336) [phone]   Fax:(336) [phone]  OFFICE PROGRESS NOTE  [name], [place] [address] [place] [address]  DIAGNOSIS:  1) stage IA (T1c, N0, M0) non-small cell lung cancer, squamous cell carcinoma diagnosed in January 2021 2) Stage III large B-cell non-Hodgkin lymphoma diagnosed in December 2016 and presented with extensive lymphadenopathy involving the neck, chest, abdomen as well as splenomegaly.   PRIOR THERAPY:  1) Systemic chemotherapy with the CHOP/Rituxan every 3 weeks with Neulasta support. Status post 6 cycles. 2) SBRT to the right upper lobe squamous cell carcinoma under the care of Dr. Sondra Come in February 2021.[name]  CURRENT THERAPY: Observation.   INTERVAL HISTORY: [name] 69 y.o. male returns to the clinic today for follow-up visit.  The patient is feeling fine today with no concerning complaints except for fatigue.  He denied having any chest pain but has shortness of breath with exertion with no cough or hemoptysis.  He denied having any nausea, vomiting, diarrhea or constipation.  He intentionally lost around 11 pounds since his last visit.  The patient had repeat CT scan of the chest performed recently and is here for evaluation and discussion of his scan results.  MEDICAL HISTORY: Past Medical History:  Diagnosis Date  . Arthritis   . BPH (benign prostatic hyperplasia) 2011  . Congestive heart failure ([place])   . COPD (chronic obstructive pulmonary disease) ([place])   . Depression   . Drug-induced neutropenia ([place]) 10/08/2015  . Encounter for antineoplastic chemotherapy 10/08/2015  . Gastrointestinal obstruction (HCC)    Ileus  . GERD (gastroesophageal reflux disease)   . Gout   . Hx of colonoscopy   . Hypertension   . Hypothyroid   . Lung cancer ([place]) 2011  . NHL (non-Hodgkin's lymphoma) ([place]) 09/25/2015  . Obesity   . Polycythemia   . Pulmonary hypertension ([place])   . Sepsis ([place]) 01/14/2016  . Streptococcal  pneumonia [facility]) August 2009    ALLERGIES:  is allergic to omnipaque [iohexol].  MEDICATIONS:  Current Outpatient Medications  Medication Sig Dispense Refill  . albuterol (PROVENTIL) (2.5 MG/3ML) 0.083% nebulizer solution Take 3 mLs (2.5 mg total) by nebulization every 4 (four) hours as needed for wheezing or shortness of breath. 75 mL 3  . allopurinol (ZYLOPRIM) 300 MG tablet Take 300 mg by mouth daily.    [name] amLODipine (NORVASC) 10 MG tablet Take 10 mg by mouth daily.    [name] amLODipine (NORVASC) 5 MG tablet Take 5 mg by mouth daily.    . betamethasone dipropionate 0.05 % lotion Apply 1 application topically 2 (two) times daily as needed (psorasis).     . finasteride (PROSCAR) 5 MG tablet Take 5 mg by mouth daily.    [name] levothyroxine (SYNTHROID, LEVOTHROID) 150 MCG tablet Take 150 mcg by mouth daily.    [name] lisinopril (PRINIVIL,ZESTRIL) 10 MG tablet Take 1 tablet (10 mg total) by mouth daily. 30 tablet 11  . omeprazole (PRILOSEC) 20 MG capsule Take 1 capsule (20 mg total) by mouth 2 (two) times daily.    [name] oxyCODONE-acetaminophen (PERCOCET) 10-325 MG tablet Take 1 tablet by mouth every 6 (six) hours as needed for pain.     [name] PROAIR HFA 108 (90 Base) MCG/ACT inhaler Inhale 2 puffs into the lungs every 6 (six) hours as needed for wheezing or shortness of breath. 3 Inhaler 1  . sodium chloride (OCEAN) 0.65 % SOLN nasal spray Place 1 spray into both nostrils  as needed for congestion.    . TRELEGY ELLIPTA 100-62.5-25 MCG/INH AEPB Inhale 1 puff into the lungs daily.     No current facility-administered medications for this visit.    SURGICAL HISTORY:  Past Surgical History:  Procedure Laterality Date  . APPENDECTOMY    . BACK SURGERY    . CARDIOVERSION  11/27/2011   Procedure: CARDIOVERSION;  Surgeon: Thayer Headings, MD;  Location: [facility] ENDOSCOPY;  Service: Cardiovascular;  Laterality: N/A;  . COLONOSCOPY    . FUDUCIAL PLACEMENT Right 10/13/2019   Procedure: Placement Of Fuducial;  Surgeon: Collene Gobble, MD;  Location: [facility] OR;  Service: Thoracic;  Laterality: Right;  Right Upper Lobe   . LUNG CANCER SURGERY  2011  . UPPER GI ENDOSCOPY    . VIDEO BRONCHOSCOPY WITH ENDOBRONCHIAL NAVIGATION N/A 10/13/2019   Procedure: VIDEO BRONCHOSCOPY WITH ENDOBRONCHIAL NAVIGATION;  Surgeon: Collene Gobble, MD;  Location: [facility] OR;  Service: Thoracic;  Laterality: N/A;  . VIDEO BRONCHOSCOPY WITH ENDOBRONCHIAL ULTRASOUND N/A 10/13/2019   Procedure: VIDEO BRONCHOSCOPY WITH ENDOBRONCHIAL ULTRASOUND;  Surgeon: Collene Gobble, MD;  Location: [facility] OR;  Service: Thoracic;  Laterality: N/A;    REVIEW OF SYSTEMS:  A comprehensive review of systems was negative except for: Constitutional: positive for fatigue Respiratory: positive for dyspnea on exertion   PHYSICAL EXAMINATION: General appearance: alert, cooperative, appears stated age, fatigued and no distress Head: Normocephalic, without obvious abnormality, atraumatic Neck: no adenopathy, no JVD, supple, symmetrical, trachea midline and thyroid not enlarged, symmetric, no tenderness/mass/nodules Lymph nodes: Cervical, supraclavicular, and axillary nodes normal. Resp: clear to auscultation bilaterally Back: symmetric, no curvature. ROM normal. No CVA tenderness. Cardio: regular rate and rhythm, S1, S2 normal, no murmur, click, rub or gallop GI: soft, non-tender; bowel sounds normal; no masses,  no organomegaly Extremities: extremities normal, atraumatic, no cyanosis or edema  ECOG PERFORMANCE STATUS: 1 - Symptomatic but completely ambulatory  Blood pressure (!) 117/58, pulse 60, temperature 98 F (36.7 C), temperature source Tympanic, resp. rate 16, height 6' (1.829 m), weight 222 lb 8 oz (100.9 kg), SpO2 99 %.  LABORATORY DATA: Lab Results  Component Value Date   WBC 5.6 06/14/2020   HGB 11.7 (L) 06/14/2020   HCT 37.1 (L) 06/14/2020   MCV 88.8 06/14/2020   PLT 173 06/14/2020      Chemistry      Component Value Date/Time   NA 140 06/14/2020 0925    NA 140 09/07/2017 1322   K 4.7 06/14/2020 0925   K 5.2 No visable hemolysis (H) 09/07/2017 1322   CL 104 06/14/2020 0925   CO2 30 06/14/2020 0925   CO2 33 (H) 09/07/2017 1322   BUN 13 06/14/2020 0925   BUN 13.6 09/07/2017 1322   CREATININE 1.01 06/14/2020 0925   CREATININE 1.0 09/07/2017 1322      Component Value Date/Time   CALCIUM 9.3 06/14/2020 0925   CALCIUM 9.1 09/07/2017 1322   ALKPHOS 102 06/14/2020 0925   ALKPHOS 92 09/07/2017 1322   AST 17 06/14/2020 0925   AST 11 09/07/2017 1322   ALT 18 06/14/2020 0925   ALT 8 09/07/2017 1322   BILITOT 0.5 06/14/2020 0925   BILITOT 0.47 09/07/2017 1322       RADIOGRAPHIC STUDIES: CT CHEST WO CONTRAST  Result Date: 06/14/2020 CLINICAL DATA:  Non-small cell lung cancer, restaging. EXAM: CT CHEST WITHOUT CONTRAST TECHNIQUE: Multidetector CT imaging of the chest was performed following the standard protocol without IV contrast. COMPARISON:  02/12/2019 FINDINGS: Cardiovascular: The  heart size is normal. Aortic atherosclerosis. Coronary artery atherosclerotic calcifications identified. Increase caliber of the main pulmonary artery is noted suggestive of PA hypertension. Mediastinum/Nodes: Normal appearance of the thyroid gland. The trachea appears patent and is midline. Normal appearance of the esophagus. No enlarged axillary or supraclavicular lymph nodes. Prominent right paratracheal lymph node measures 1.4 cm, image 62/2. Stable. The index right hilar lymph node measures 1.2 cm, image 81/2. Previously 1.6 cm. Lungs/Pleura: Within the right upper lobe there is a large area of masslike architectural distortion which has a bandlike configuration seen on the sagittal images compatible with changes secondary to external beam radiation. Fiducial markers are identified within this area which obscures the treated lung lesion. Suture line noted within the medial left apex. No new pulmonary lesions. Upper Abdomen: No acute abnormality within the imaged  portions of the upper abdomen. The stones identified within upper pole of right kidney. Low-attenuation structure within the spleen is stable measuring 3 cm. Right kidney cyst is also unchanged measuring 4.4 cm. Musculoskeletal: No acute or suspicious osseous lesions. IMPRESSION: 1. Interval development of bandlike area of masslike architectural distortion within the right upper lobe compatible with changes secondary to external beam radiation. This obscures the underlying treated lung lesion which is no longer measurable. No new suspicious lung lesions and no findings of metastatic disease. 2. Coronary artery calcifications noted. 3. Increase caliber of the main pulmonary artery suggestive of PA hypertension. 4. Nonobstructing right renal calculi. 5. Aortic atherosclerosis. Aortic Atherosclerosis (ICD10-I70.0). Electronically Signed   By: Kerby Moors M.D.   On: 06/14/2020 15:43    ASSESSMENT AND PLAN:  This is a very pleasant 69 years old white male with 1) stage IIIa large B-cell non-Hodgkin lymphoma status post 6 cycles of systemic chemotherapy with CHOP/Rituxan with almost complete response. The patient has been on observation for several years. 2) stage Ia (T1c, N0, M0) non-small cell lung cancer, squamous cell carcinoma presented with right upper lobe pulmonary mass.  This was found on repeat imaging studies with CT scan of the chest as well as a PET scan that showed hypermetabolic activity in the right upper lobe lung mass with no evidence for lymphadenopathy or distant metastatic disease. The patient underwent SBRT to the recurrent non-small cell lung cancer, squamous cell carcinoma of the right upper lobe.  He tolerated the procedure well. The patient is currently on observation and he is feeling fine today with no concerning complaints. He had repeat CT scan of the chest performed recently.  I personally and independently reviewed the scans and discussed the results with the patient  today. His scan showed evolution of the radiation changes at the right upper lobe treated area.  He has no other concerning findings for disease recurrence or progression. I recommended for him to continue on observation with repeat CT scan of the chest in 6 months. He was advised to call immediately if he has any concerning symptoms in the interval. The patient voices understanding of current disease status and treatment options and is in agreement with the current care plan. All questions were answered. The patient knows to call the clinic with any problems, questions or concerns. We can certainly see the patient much sooner if necessary.  Disclaimer: This note was dictated with voice recognition software. Similar sounding words can inadvertently be transcribed and may not be corrected upon review.

## 2020-06-21 ENCOUNTER — Telehealth: Payer: Self-pay | Admitting: Internal Medicine

## 2020-06-21 NOTE — Telephone Encounter (Signed)
Scheduled per los. Called and left msg. Mailed printout  °

## 2020-07-29 ENCOUNTER — Other Ambulatory Visit: Payer: Self-pay

## 2020-07-29 ENCOUNTER — Inpatient Hospital Stay: Payer: PPO | Attending: Internal Medicine

## 2020-07-29 DIAGNOSIS — M79604 Pain in right leg: Secondary | ICD-10-CM | POA: Diagnosis not present

## 2020-07-29 DIAGNOSIS — M5416 Radiculopathy, lumbar region: Secondary | ICD-10-CM | POA: Diagnosis not present

## 2020-07-29 DIAGNOSIS — Z20822 Contact with and (suspected) exposure to covid-19: Secondary | ICD-10-CM | POA: Diagnosis not present

## 2020-07-29 DIAGNOSIS — J018 Other acute sinusitis: Secondary | ICD-10-CM | POA: Diagnosis not present

## 2020-07-29 DIAGNOSIS — Z452 Encounter for adjustment and management of vascular access device: Secondary | ICD-10-CM | POA: Diagnosis not present

## 2020-07-29 DIAGNOSIS — Z122 Encounter for screening for malignant neoplasm of respiratory organs: Secondary | ICD-10-CM | POA: Diagnosis not present

## 2020-07-29 DIAGNOSIS — C3411 Malignant neoplasm of upper lobe, right bronchus or lung: Secondary | ICD-10-CM | POA: Diagnosis not present

## 2020-07-29 DIAGNOSIS — Z79899 Other long term (current) drug therapy: Secondary | ICD-10-CM | POA: Diagnosis not present

## 2020-07-29 DIAGNOSIS — G8929 Other chronic pain: Secondary | ICD-10-CM | POA: Diagnosis not present

## 2020-07-29 DIAGNOSIS — M79605 Pain in left leg: Secondary | ICD-10-CM | POA: Diagnosis not present

## 2020-07-29 DIAGNOSIS — Z95828 Presence of other vascular implants and grafts: Secondary | ICD-10-CM

## 2020-07-29 DIAGNOSIS — J449 Chronic obstructive pulmonary disease, unspecified: Secondary | ICD-10-CM | POA: Diagnosis not present

## 2020-07-29 MED ORDER — HEPARIN SOD (PORK) LOCK FLUSH 100 UNIT/ML IV SOLN
500.0000 [IU] | Freq: Once | INTRAVENOUS | Status: AC | PRN
  Administered 2020-07-29: 500 [IU] via INTRAVENOUS
  Filled 2020-07-29: qty 5

## 2020-07-29 MED ORDER — SODIUM CHLORIDE 0.9% FLUSH
10.0000 mL | INTRAVENOUS | Status: DC | PRN
  Administered 2020-07-29: 10 mL via INTRAVENOUS
  Filled 2020-07-29: qty 10

## 2020-07-29 NOTE — Patient Instructions (Signed)

## 2020-08-30 DIAGNOSIS — E039 Hypothyroidism, unspecified: Secondary | ICD-10-CM | POA: Diagnosis not present

## 2020-08-30 DIAGNOSIS — Z20822 Contact with and (suspected) exposure to covid-19: Secondary | ICD-10-CM | POA: Diagnosis not present

## 2020-08-30 DIAGNOSIS — Z79899 Other long term (current) drug therapy: Secondary | ICD-10-CM | POA: Diagnosis not present

## 2020-08-30 DIAGNOSIS — R0602 Shortness of breath: Secondary | ICD-10-CM | POA: Diagnosis not present

## 2020-08-30 DIAGNOSIS — E7800 Pure hypercholesterolemia, unspecified: Secondary | ICD-10-CM | POA: Diagnosis not present

## 2020-08-30 DIAGNOSIS — E559 Vitamin D deficiency, unspecified: Secondary | ICD-10-CM | POA: Diagnosis not present

## 2020-08-30 DIAGNOSIS — M5416 Radiculopathy, lumbar region: Secondary | ICD-10-CM | POA: Diagnosis not present

## 2020-08-30 DIAGNOSIS — M129 Arthropathy, unspecified: Secondary | ICD-10-CM | POA: Diagnosis not present

## 2020-08-30 DIAGNOSIS — E611 Iron deficiency: Secondary | ICD-10-CM | POA: Diagnosis not present

## 2020-08-30 DIAGNOSIS — G8929 Other chronic pain: Secondary | ICD-10-CM | POA: Diagnosis not present

## 2020-09-27 ENCOUNTER — Encounter: Payer: Self-pay | Admitting: Internal Medicine

## 2020-09-27 DIAGNOSIS — Z79899 Other long term (current) drug therapy: Secondary | ICD-10-CM | POA: Diagnosis not present

## 2020-09-27 DIAGNOSIS — R0602 Shortness of breath: Secondary | ICD-10-CM | POA: Diagnosis not present

## 2020-09-27 DIAGNOSIS — G8929 Other chronic pain: Secondary | ICD-10-CM | POA: Diagnosis not present

## 2020-09-27 DIAGNOSIS — Z87891 Personal history of nicotine dependence: Secondary | ICD-10-CM | POA: Diagnosis not present

## 2020-09-27 DIAGNOSIS — M5416 Radiculopathy, lumbar region: Secondary | ICD-10-CM | POA: Diagnosis not present

## 2020-09-27 DIAGNOSIS — I10 Essential (primary) hypertension: Secondary | ICD-10-CM | POA: Diagnosis not present

## 2020-10-04 ENCOUNTER — Inpatient Hospital Stay: Payer: PPO | Attending: Internal Medicine

## 2020-10-04 ENCOUNTER — Other Ambulatory Visit: Payer: Self-pay

## 2020-10-04 ENCOUNTER — Telehealth: Payer: Self-pay | Admitting: Internal Medicine

## 2020-10-04 DIAGNOSIS — C3411 Malignant neoplasm of upper lobe, right bronchus or lung: Secondary | ICD-10-CM | POA: Insufficient documentation

## 2020-10-04 DIAGNOSIS — Z452 Encounter for adjustment and management of vascular access device: Secondary | ICD-10-CM | POA: Insufficient documentation

## 2020-10-04 DIAGNOSIS — Z95828 Presence of other vascular implants and grafts: Secondary | ICD-10-CM

## 2020-10-04 MED ORDER — HEPARIN SOD (PORK) LOCK FLUSH 100 UNIT/ML IV SOLN
500.0000 [IU] | Freq: Once | INTRAVENOUS | Status: AC | PRN
Start: 2020-10-04 — End: 2020-10-04
  Administered 2020-10-04: 500 [IU] via INTRAVENOUS
  Filled 2020-10-04: qty 5

## 2020-10-04 MED ORDER — SODIUM CHLORIDE 0.9% FLUSH
10.0000 mL | INTRAVENOUS | Status: DC | PRN
  Administered 2020-10-04: 10 mL via INTRAVENOUS
  Filled 2020-10-04: qty 10

## 2020-10-04 NOTE — Telephone Encounter (Signed)
Patient came in wanting to schedule a port flush appointment. Msged Dr. Worthy Flank nurse, RN Jeannette Corpus, who said it was okay to schedule him. Scheduled him out for several more flushes per patient's request. Gave patient updated calendar.

## 2020-10-04 NOTE — Patient Instructions (Signed)

## 2020-10-25 ENCOUNTER — Telehealth: Payer: Self-pay | Admitting: Internal Medicine

## 2020-10-25 ENCOUNTER — Encounter: Payer: Self-pay | Admitting: Internal Medicine

## 2020-10-25 DIAGNOSIS — G8929 Other chronic pain: Secondary | ICD-10-CM | POA: Diagnosis not present

## 2020-10-25 DIAGNOSIS — M5416 Radiculopathy, lumbar region: Secondary | ICD-10-CM | POA: Diagnosis not present

## 2020-10-25 DIAGNOSIS — Z79899 Other long term (current) drug therapy: Secondary | ICD-10-CM | POA: Diagnosis not present

## 2020-10-25 DIAGNOSIS — R0602 Shortness of breath: Secondary | ICD-10-CM | POA: Diagnosis not present

## 2020-10-25 DIAGNOSIS — R0989 Other specified symptoms and signs involving the circulatory and respiratory systems: Secondary | ICD-10-CM | POA: Diagnosis not present

## 2020-10-25 DIAGNOSIS — J449 Chronic obstructive pulmonary disease, unspecified: Secondary | ICD-10-CM | POA: Diagnosis not present

## 2020-10-25 DIAGNOSIS — M79605 Pain in left leg: Secondary | ICD-10-CM | POA: Diagnosis not present

## 2020-10-25 DIAGNOSIS — I272 Pulmonary hypertension, unspecified: Secondary | ICD-10-CM | POA: Diagnosis not present

## 2020-10-25 DIAGNOSIS — M79604 Pain in right leg: Secondary | ICD-10-CM | POA: Diagnosis not present

## 2020-10-25 NOTE — Telephone Encounter (Signed)
Schedule message has been sent to add the pt to Dr. Ellan Lambert schedule for next week.  I have also spoken to the pt and advised of this. He is also aware we need the imaging disc and report from their office. Pt states he is on his way to pick this information up and drop it off here to the cancer center.

## 2020-10-25 NOTE — Telephone Encounter (Signed)
Scheduled follow-up appointment per 2/4 schedule message. Patient is aware.

## 2020-10-31 ENCOUNTER — Inpatient Hospital Stay: Payer: PPO | Attending: Internal Medicine | Admitting: Internal Medicine

## 2020-10-31 ENCOUNTER — Other Ambulatory Visit: Payer: Self-pay

## 2020-10-31 VITALS — BP 142/74 | HR 76 | Temp 98.3°F | Resp 15 | Ht 72.0 in | Wt 229.8 lb

## 2020-10-31 DIAGNOSIS — I509 Heart failure, unspecified: Secondary | ICD-10-CM | POA: Diagnosis not present

## 2020-10-31 DIAGNOSIS — N400 Enlarged prostate without lower urinary tract symptoms: Secondary | ICD-10-CM | POA: Diagnosis not present

## 2020-10-31 DIAGNOSIS — F329 Major depressive disorder, single episode, unspecified: Secondary | ICD-10-CM | POA: Insufficient documentation

## 2020-10-31 DIAGNOSIS — C3411 Malignant neoplasm of upper lobe, right bronchus or lung: Secondary | ICD-10-CM | POA: Diagnosis not present

## 2020-10-31 DIAGNOSIS — C3491 Malignant neoplasm of unspecified part of right bronchus or lung: Secondary | ICD-10-CM | POA: Diagnosis not present

## 2020-10-31 DIAGNOSIS — Z79899 Other long term (current) drug therapy: Secondary | ICD-10-CM | POA: Diagnosis not present

## 2020-10-31 DIAGNOSIS — I272 Pulmonary hypertension, unspecified: Secondary | ICD-10-CM | POA: Diagnosis not present

## 2020-10-31 DIAGNOSIS — C3490 Malignant neoplasm of unspecified part of unspecified bronchus or lung: Secondary | ICD-10-CM | POA: Diagnosis not present

## 2020-10-31 DIAGNOSIS — M129 Arthropathy, unspecified: Secondary | ICD-10-CM | POA: Diagnosis not present

## 2020-10-31 DIAGNOSIS — E669 Obesity, unspecified: Secondary | ICD-10-CM | POA: Diagnosis not present

## 2020-10-31 DIAGNOSIS — E039 Hypothyroidism, unspecified: Secondary | ICD-10-CM | POA: Insufficient documentation

## 2020-10-31 DIAGNOSIS — C8190 Hodgkin lymphoma, unspecified, unspecified site: Secondary | ICD-10-CM | POA: Insufficient documentation

## 2020-10-31 DIAGNOSIS — I10 Essential (primary) hypertension: Secondary | ICD-10-CM | POA: Diagnosis not present

## 2020-10-31 DIAGNOSIS — R590 Localized enlarged lymph nodes: Secondary | ICD-10-CM | POA: Insufficient documentation

## 2020-10-31 DIAGNOSIS — Z9221 Personal history of antineoplastic chemotherapy: Secondary | ICD-10-CM | POA: Insufficient documentation

## 2020-10-31 DIAGNOSIS — K219 Gastro-esophageal reflux disease without esophagitis: Secondary | ICD-10-CM | POA: Insufficient documentation

## 2020-10-31 DIAGNOSIS — J449 Chronic obstructive pulmonary disease, unspecified: Secondary | ICD-10-CM | POA: Insufficient documentation

## 2020-10-31 DIAGNOSIS — Z8572 Personal history of non-Hodgkin lymphomas: Secondary | ICD-10-CM | POA: Diagnosis not present

## 2020-10-31 DIAGNOSIS — D7389 Other diseases of spleen: Secondary | ICD-10-CM | POA: Insufficient documentation

## 2020-10-31 MED ORDER — PREDNISONE 50 MG PO TABS: ORAL_TABLET | ORAL | 0 refills | Status: DC

## 2020-10-31 NOTE — Progress Notes (Signed)
[place] [phone]   Fax:(336) [phone]  OFFICE PROGRESS NOTE  [name], [place] [address] [place] [address]  DIAGNOSIS:  1) stage IA (T1c, N0, M0) non-small cell lung cancer, squamous cell carcinoma diagnosed in January 2021 2) Stage III large B-cell non-Hodgkin lymphoma diagnosed in December 2016 and presented with extensive lymphadenopathy involving the neck, chest, abdomen as well as splenomegaly.   PRIOR THERAPY:  1) Systemic chemotherapy with the CHOP/Rituxan every 3 weeks with Neulasta support. Status post 6 cycles. 2) SBRT to the right upper lobe squamous cell carcinoma under the care of Dr. Sondra Come in February 2021.[name]  CURRENT THERAPY: Observation.   INTERVAL HISTORY: [name] 70 y.o. male returns to the clinic today for follow-up visit.  The patient was seen recently at [place] by primary care physician and during his visit CT screening of the chest was ordered because of his long smoking history.  The patient did not have any complaints at that time.  He mentioned that several weeks ago he started having shortness of breath because of exhaust leak in his truck that was fixed later on and the patient felt much better in the last 2 weeks.  He currently denied having any chest pain has mild shortness of breath with exertion with no cough or hemoptysis.  He denied having any fever or chills.  He has no nausea, vomiting, diarrhea or constipation.  His CT screening performed by the primary care physician in a different facility showed a suspicious spiculated 1.7 cm nodule in the medial left lower lobe.  The patient was referred to me today for evaluation and recommendation regarding this new abnormality.  MEDICAL HISTORY: Past Medical History:  Diagnosis Date  . Arthritis   . BPH (benign prostatic hyperplasia) 2011  . Congestive heart failure ([place])   . COPD (chronic obstructive pulmonary disease) ([place])   . Depression   .  Drug-induced neutropenia ([place]) 10/08/2015  . Encounter for antineoplastic chemotherapy 10/08/2015  . Gastrointestinal obstruction (H[facility])    Ileus  . GERD (gastroesophageal reflux disease)   . Gout   . Hx of colonoscopy   . Hypertension   . Hypothyroid   . Lung cancer ([place]) 2011  . NHL (non-Hodgkin's lymphoma) ([place]) 09/25/2015  . Obesity   . Polycythemia   . Pulmonary hypertension ([place])   . Sepsis ([place]) 01/14/2016  . Streptococcal pneumonia [facility]) August 2009    ALLERGIES:  is allergic to omnipaque [iohexol].  MEDICATIONS:  Current Outpatient Medications  Medication Sig Dispense Refill  . albuterol (PROVENTIL) (2.5 MG/3ML) 0.083% nebulizer solution Take 3 mLs (2.5 mg total) by nebulization every 4 (four) hours as needed for wheezing or shortness of breath. 75 mL 3  . allopurinol (ZYLOPRIM) 300 MG tablet Take 300 mg by mouth daily.    [name] amLODipine (NORVASC) 10 MG tablet Take 10 mg by mouth daily.    [name] amLODipine (NORVASC) 5 MG tablet Take 5 mg by mouth daily.    . betamethasone dipropionate 0.05 % lotion Apply 1 application topically 2 (two) times daily as needed (psorasis).     . finasteride (PROSCAR) 5 MG tablet Take 5 mg by mouth daily.    [name] levothyroxine (SYNTHROID, LEVOTHROID) 150 MCG tablet Take 150 mcg by mouth daily.    [name] lisinopril (PRINIVIL,ZESTRIL) 10 MG tablet Take 1 tablet (10 mg total) by mouth daily. 30 tablet 11  . omeprazole (PRILOSEC) 20 MG capsule Take 1 capsule (  20 mg total) by mouth 2 (two) times daily.    [name] oxyCODONE-acetaminophen (PERCOCET) 10-325 MG tablet Take 1 tablet by mouth every 6 (six) hours as needed for pain.     [name] PROAIR HFA 108 (90 Base) MCG/ACT inhaler Inhale 2 puffs into the lungs every 6 (six) hours as needed for wheezing or shortness of breath. 3 Inhaler 1  . sodium chloride (OCEAN) 0.65 % SOLN nasal spray Place 1 spray into both nostrils as needed for congestion.    . TRELEGY ELLIPTA 100-62.5-25 MCG/INH AEPB Inhale 1 puff into the lungs daily.      No current facility-administered medications for this visit.    SURGICAL HISTORY:  Past Surgical History:  Procedure Laterality Date  . APPENDECTOMY    . BACK SURGERY    . CARDIOVERSION  11/27/2011   Procedure: CARDIOVERSION;  Surgeon: Thayer Headings, MD;  Location: [facility] ENDOSCOPY;  Service: Cardiovascular;  Laterality: N/A;  . COLONOSCOPY    . FUDUCIAL PLACEMENT Right 10/13/2019   Procedure: Placement Of Fuducial;  Surgeon: Collene Gobble, MD;  [facility] OR;  Service: Thoracic;  Laterality: Right;  Right Upper Lobe   . LUNG CANCER SURGERY  2011  . UPPER GI ENDOSCOPY    . VIDEO BRONCHOSCOPY WITH ENDOBRONCHIAL NAVIGATION N/A 10/13/2019   Procedure: VIDEO BRONCHOSCOPY WITH ENDOBRONCHIAL NAVIGATION;  Surgeon: Collene Gobble, MD;  Location: [facility] OR;  Service: Thoracic;  Laterality: N/A;  . VIDEO BRONCHOSCOPY WITH ENDOBRONCHIAL ULTRASOUND N/A 10/13/2019   Procedure: VIDEO BRONCHOSCOPY WITH ENDOBRONCHIAL ULTRASOUND;  Surgeon: Collene Gobble, MD;  Location: [facility] OR;  Service: Thoracic;  Laterality: N/A;    REVIEW OF SYSTEMS:  Constitutional: positive for fatigue Eyes: negative Ears, nose, mouth, throat, and face: negative Respiratory: positive for dyspnea on exertion Cardiovascular: negative Gastrointestinal: negative Genitourinary:negative Integument/breast: negative Hematologic/lymphatic: negative Musculoskeletal:negative Neurological: negative Behavioral/Psych: negative Endocrine: negative Allergic/Immunologic: negative   PHYSICAL EXAMINATION: General appearance: alert, cooperative, appears stated age and no distress Head: Normocephalic, without obvious abnormality, atraumatic Neck: no adenopathy, no JVD, supple, symmetrical, trachea midline and thyroid not enlarged, symmetric, no tenderness/mass/nodules Lymph nodes: Cervical, supraclavicular, and axillary nodes normal. Resp: clear to auscultation bilaterally Back: symmetric, no curvature. ROM normal. No CVA tenderness. Cardio:  regular rate and rhythm, S1, S2 normal, no murmur, click, rub or gallop GI: soft, non-tender; bowel sounds normal; no masses,  no organomegaly Extremities: extremities normal, atraumatic, no cyanosis or edema Neurologic: Alert and oriented X 3, normal strength and tone. Normal symmetric reflexes. Normal coordination and gait  ECOG PERFORMANCE STATUS: 1 - Symptomatic but completely ambulatory  Blood pressure (!) 142/74, pulse 76, temperature 98.3 F (36.8 C), temperature source Tympanic, resp. rate 15, height 6' (1.829 m), weight 229 lb 12.8 oz (104.2 kg), SpO2 96 %.  LABORATORY DATA: Lab Results  Component Value Date   WBC 5.6 06/14/2020   HGB 11.7 (L) 06/14/2020   HCT 37.1 (L) 06/14/2020   MCV 88.8 06/14/2020   PLT 173 06/14/2020      Chemistry      Component Value Date/Time   NA 140 06/14/2020 0925   NA 140 09/07/2017 1322   K 4.7 06/14/2020 0925   K 5.2 No visable hemolysis (H) 09/07/2017 1322   CL 104 06/14/2020 0925   CO2 30 06/14/2020 0925   CO2 33 (H) 09/07/2017 1322   BUN 13 06/14/2020 0925   BUN 13.6 09/07/2017 1322   CREATININE 1.01 06/14/2020 0925   CREATININE 1.0 09/07/2017 1322  Component Value Date/Time   CALCIUM 9.3 06/14/2020 0925   CALCIUM 9.1 09/07/2017 1322   ALKPHOS 102 06/14/2020 0925   ALKPHOS 92 09/07/2017 1322   AST 17 06/14/2020 0925   AST 11 09/07/2017 1322   ALT 18 06/14/2020 0925   ALT 8 09/07/2017 1322   BILITOT 0.5 06/14/2020 0925   BILITOT 0.47 09/07/2017 1322       RADIOGRAPHIC STUDIES: No results found.  ASSESSMENT AND PLAN:  This is a very pleasant 70 years old white male with 1) stage IIIa large B-cell non-Hodgkin lymphoma status post 6 cycles of systemic chemotherapy with CHOP/Rituxan with almost complete response. The patient has been on observation for several years. 2) stage Ia (T1c, N0, M0) non-small cell lung cancer, squamous cell carcinoma presented with right upper lobe pulmonary mass.  This was found on repeat  imaging studies with CT scan of the chest as well as a PET scan that showed hypermetabolic activity in the right upper lobe lung mass with no evidence for lymphadenopathy or distant metastatic disease. The patient underwent SBRT to the recurrent non-small cell lung cancer, squamous cell carcinoma of the right upper lobe.  He tolerated the procedure well. The patient is currently on observation and his last CT scan of the chest in September 2021 was unremarkable for any disease progression but a recent CT screening at an outside facility showed a suspicious spiculated 1.7 cm medial left lower lobe pulmonary nodule.  I personally and independently reviewed the scan images and discussed the results with the patient.  This could be inflammatory in origin but new malignancy could not be excluded. I recommended for the patient to have repeat CT scan of the chest with contrast next week. I will see him back for follow-up visit in 2 weeks for evaluation and discussion of his scan results and treatment options. The patient was advised to call immediately if he has any other concerning symptoms in the interval. The patient voices understanding of current disease status and treatment options and is in agreement with the current care plan. All questions were answered. The patient knows to call the clinic with any problems, questions or concerns. We can certainly see the patient much sooner if necessary.  Disclaimer: This note was dictated with voice recognition software. Similar sounding words can inadvertently be transcribed and may not be corrected upon review.

## 2020-11-05 ENCOUNTER — Other Ambulatory Visit: Payer: Self-pay | Admitting: Medical Oncology

## 2020-11-05 DIAGNOSIS — C3491 Malignant neoplasm of unspecified part of right bronchus or lung: Secondary | ICD-10-CM

## 2020-11-07 ENCOUNTER — Encounter (HOSPITAL_COMMUNITY): Payer: Self-pay

## 2020-11-07 ENCOUNTER — Ambulatory Visit (HOSPITAL_COMMUNITY)
Admission: RE | Admit: 2020-11-07 | Discharge: 2020-11-07 | Disposition: A | Discharge status: Home / Self Care | Payer: PPO | Source: Ambulatory Visit | Attending: Internal Medicine | Admitting: Internal Medicine

## 2020-11-07 ENCOUNTER — Other Ambulatory Visit: Payer: Self-pay

## 2020-11-07 ENCOUNTER — Inpatient Hospital Stay: Payer: PPO

## 2020-11-07 DIAGNOSIS — C3490 Malignant neoplasm of unspecified part of unspecified bronchus or lung: Secondary | ICD-10-CM | POA: Diagnosis not present

## 2020-11-07 DIAGNOSIS — J948 Other specified pleural conditions: Secondary | ICD-10-CM | POA: Diagnosis not present

## 2020-11-07 DIAGNOSIS — R590 Localized enlarged lymph nodes: Secondary | ICD-10-CM | POA: Diagnosis not present

## 2020-11-07 DIAGNOSIS — C3491 Malignant neoplasm of unspecified part of right bronchus or lung: Secondary | ICD-10-CM

## 2020-11-07 DIAGNOSIS — C8190 Hodgkin lymphoma, unspecified, unspecified site: Secondary | ICD-10-CM | POA: Diagnosis not present

## 2020-11-07 DIAGNOSIS — J181 Lobar pneumonia, unspecified organism: Secondary | ICD-10-CM | POA: Diagnosis not present

## 2020-11-07 LAB — CBC WITH DIFFERENTIAL (CANCER CENTER ONLY)
Abs Immature Granulocytes: 0.06 10*3/uL (ref 0.00–0.07)
Basophils Absolute: 0 10*3/uL (ref 0.0–0.1)
Basophils Relative: 0 %
Eosinophils Absolute: 0 10*3/uL (ref 0.0–0.5)
Eosinophils Relative: 0 %
HCT: 38 % — ABNORMAL LOW (ref 39.0–52.0)
Hemoglobin: 11.9 g/dL — ABNORMAL LOW (ref 13.0–17.0)
Immature Granulocytes: 1 %
Lymphocytes Relative: 8 %
Lymphs Abs: 0.5 10*3/uL — ABNORMAL LOW (ref 0.7–4.0)
MCH: 29.2 pg (ref 26.0–34.0)
MCHC: 31.3 g/dL (ref 30.0–36.0)
MCV: 93.4 fL (ref 80.0–100.0)
Monocytes Absolute: 0.1 10*3/uL (ref 0.1–1.0)
Monocytes Relative: 1 %
Neutro Abs: 5.5 10*3/uL (ref 1.7–7.7)
Neutrophils Relative %: 90 %
Platelet Count: 191 10*3/uL (ref 150–400)
RBC: 4.07 MIL/uL — ABNORMAL LOW (ref 4.22–5.81)
RDW: 13.8 % (ref 11.5–15.5)
WBC Count: 6.1 10*3/uL (ref 4.0–10.5)
nRBC: 0 % (ref 0.0–0.2)

## 2020-11-07 LAB — CMP (CANCER CENTER ONLY)
ALT: 24 U/L (ref 0–44)
AST: 19 U/L (ref 15–41)
Albumin: 4.1 g/dL (ref 3.5–5.0)
Alkaline Phosphatase: 111 U/L (ref 38–126)
Anion gap: 5 (ref 5–15)
BUN: 26 mg/dL — ABNORMAL HIGH (ref 8–23)
CO2: 25 mmol/L (ref 22–32)
Calcium: 9.4 mg/dL (ref 8.9–10.3)
Chloride: 107 mmol/L (ref 98–111)
Creatinine: 1.13 mg/dL (ref 0.61–1.24)
GFR, Estimated: >60 mL/min (ref >60)
Glucose, Bld: 165 mg/dL — ABNORMAL HIGH (ref 70–99)
Potassium: 5.6 mmol/L — ABNORMAL HIGH (ref 3.5–5.1)
Sodium: 137 mmol/L (ref 135–145)
Total Bilirubin: 0.4 mg/dL (ref 0.3–1.2)
Total Protein: 7.4 g/dL (ref 6.5–8.1)

## 2020-11-07 MED ORDER — IOHEXOL 300 MG/ML  SOLN
75.0000 mL | Freq: Once | INTRAMUSCULAR | Status: AC | PRN
  Administered 2020-11-07: 75 mL via INTRAVENOUS

## 2020-11-11 ENCOUNTER — Other Ambulatory Visit: Payer: Self-pay

## 2020-11-11 ENCOUNTER — Inpatient Hospital Stay: Payer: PPO | Admitting: Internal Medicine

## 2020-11-11 VITALS — BP 137/74 | HR 76 | Temp 97.7°F | Resp 14 | Ht 72.0 in | Wt 233.1 lb

## 2020-11-11 DIAGNOSIS — C3491 Malignant neoplasm of unspecified part of right bronchus or lung: Secondary | ICD-10-CM | POA: Diagnosis not present

## 2020-11-11 DIAGNOSIS — C8190 Hodgkin lymphoma, unspecified, unspecified site: Secondary | ICD-10-CM | POA: Diagnosis not present

## 2020-11-11 DIAGNOSIS — I10 Essential (primary) hypertension: Secondary | ICD-10-CM

## 2020-11-11 DIAGNOSIS — C3490 Malignant neoplasm of unspecified part of unspecified bronchus or lung: Secondary | ICD-10-CM

## 2020-11-11 NOTE — Progress Notes (Signed)
[name]:([phone]   Fax:(336) [phone]  OFFICE PROGRESS NOTE  [name], [place] [address] [place] [address]  DIAGNOSIS:  1) stage IA (T1c, N0, M0) non-small cell lung cancer, squamous cell carcinoma diagnosed in January 2021 2) Stage III large B-cell non-Hodgkin lymphoma diagnosed in December 2016 and presented with extensive lymphadenopathy involving the neck, chest, abdomen as well as splenomegaly.   PRIOR THERAPY:  1) Systemic chemotherapy with the CHOP/Rituxan every 3 weeks with Neulasta support. Status post 6 cycles. 2) SBRT to the right upper lobe squamous cell carcinoma under the care of Dr. Sondra Come in February 2021.[name]  CURRENT THERAPY: Observation.   INTERVAL HISTORY: [name] 70 y.o. male returns to the clinic today for follow-up visit accompanied by his wife.  The patient is feeling fine today with no concerning complaints except for the baseline shortness of breath increased with exertion.  He denied having any chest pain, cough or hemoptysis.  He denied having any nausea, vomiting, diarrhea or constipation.  He has no recent weight loss or night sweats.  He had repeat CT scan of the chest performed recently for evaluation of suspicious nodule in the left lower lobe of the lung.  MEDICAL HISTORY: Past Medical History:  Diagnosis Date  . Arthritis   . BPH (benign prostatic hyperplasia) 2011  . Congestive heart failure ([place])   . COPD (chronic obstructive pulmonary disease) ([place])   . Depression   . Drug-induced neutropenia ([place]) 10/08/2015  . Encounter for antineoplastic chemotherapy 10/08/2015  . Gastrointestinal obstruction (HCC)    Ileus  . GERD (gastroesophageal reflux disease)   . Gout   . Hx of colonoscopy   . Hypertension   . Hypothyroid   . Lung cancer ([place]) 2011  . NHL (non-Hodgkin's lymphoma) ([place]) 09/25/2015  . Obesity   . Polycythemia   . Pulmonary hypertension ([place])   . Sepsis ([place]) 01/14/2016  .  Streptococcal pneumonia [facility]) August 2009    ALLERGIES:  is allergic to omnipaque [iohexol].  MEDICATIONS:  Current Outpatient Medications  Medication Sig Dispense Refill  . albuterol (PROVENTIL) (2.5 MG/3ML) 0.083% nebulizer solution Take 3 mLs (2.5 mg total) by nebulization every 4 (four) hours as needed for wheezing or shortness of breath. 75 mL 3  . allopurinol (ZYLOPRIM) 300 MG tablet Take 300 mg by mouth daily.    [name] amLODipine (NORVASC) 10 MG tablet Take 10 mg by mouth daily.    [name] amLODipine (NORVASC) 5 MG tablet Take 5 mg by mouth daily.    . betamethasone dipropionate 0.05 % lotion Apply 1 application topically 2 (two) times daily as needed (psorasis).     . finasteride (PROSCAR) 5 MG tablet Take 5 mg by mouth daily.    [name] levothyroxine (SYNTHROID, LEVOTHROID) 150 MCG tablet Take 150 mcg by mouth daily.    [name] lisinopril (PRINIVIL,ZESTRIL) 10 MG tablet Take 1 tablet (10 mg total) by mouth daily. 30 tablet 11  . omeprazole (PRILOSEC) 20 MG capsule Take 1 capsule (20 mg total) by mouth 2 (two) times daily.    [name] oxyCODONE-acetaminophen (PERCOCET) 10-325 MG tablet Take 1 tablet by mouth every 6 (six) hours as needed for pain.     . predniSONE (DELTASONE) 50 MG tablet 1 tablet p.o. 14 hours then 7 hours then 2 hours before the scan.  Please also take Benadryl 25 mg 2 hours before the scan. 3 tablet 0  . PROAIR HFA 108 (90 Base) MCG/ACT  inhaler Inhale 2 puffs into the lungs every 6 (six) hours as needed for wheezing or shortness of breath. 3 Inhaler 1  . sodium chloride (OCEAN) 0.65 % SOLN nasal spray Place 1 spray into both nostrils as needed for congestion.    . TRELEGY ELLIPTA 100-62.5-25 MCG/INH AEPB Inhale 1 puff into the lungs daily.     No current facility-administered medications for this visit.    SURGICAL HISTORY:  Past Surgical History:  Procedure Laterality Date  . APPENDECTOMY    . BACK SURGERY    . CARDIOVERSION  11/27/2011   Procedure: CARDIOVERSION;  Surgeon: Thayer Headings, MD;  Location: [facility] ENDOSCOPY;  Service: Cardiovascular;  Laterality: N/A;  . COLONOSCOPY    . FUDUCIAL PLACEMENT Right 10/13/2019   Procedure: Placement Of Fuducial;  Surgeon: Collene Gobble, MD;  Location: [facility] OR;  Service: Thoracic;  Laterality: Right;  Right Upper Lobe   . LUNG CANCER SURGERY  2011  . UPPER GI ENDOSCOPY    . VIDEO BRONCHOSCOPY WITH ENDOBRONCHIAL NAVIGATION N/A 10/13/2019   Procedure: VIDEO BRONCHOSCOPY WITH ENDOBRONCHIAL NAVIGATION;  Surgeon: Collene Gobble, MD;  Location: [facility] OR;  Service: Thoracic;  Laterality: N/A;  . VIDEO BRONCHOSCOPY WITH ENDOBRONCHIAL ULTRASOUND N/A 10/13/2019   Procedure: VIDEO BRONCHOSCOPY WITH ENDOBRONCHIAL ULTRASOUND;  Surgeon: Collene Gobble, MD;  Location: [facility] OR;  Service: Thoracic;  Laterality: N/A;    REVIEW OF SYSTEMS:  A comprehensive review of systems was negative except for: Constitutional: positive for fatigue Respiratory: positive for dyspnea on exertion   PHYSICAL EXAMINATION: General appearance: alert, cooperative, appears stated age, fatigued and no distress Head: Normocephalic, without obvious abnormality, atraumatic Neck: no adenopathy, no JVD, supple, symmetrical, trachea midline and thyroid not enlarged, symmetric, no tenderness/mass/nodules Lymph nodes: Cervical, supraclavicular, and axillary nodes normal. Resp: clear to auscultation bilaterally Back: symmetric, no curvature. ROM normal. No CVA tenderness. Cardio: regular rate and rhythm, S1, S2 normal, no murmur, click, rub or gallop GI: soft, non-tender; bowel sounds normal; no masses,  no organomegaly Extremities: extremities normal, atraumatic, no cyanosis or edema  ECOG PERFORMANCE STATUS: 1 - Symptomatic but completely ambulatory  Blood pressure 137/74, pulse 76, temperature 97.7 F (36.5 C), temperature source Tympanic, resp. rate 14, height 6' (1.829 m), weight 233 lb 1.6 oz (105.7 kg), SpO2 97 %.  LABORATORY DATA: Lab Results  Component Value Date   WBC  6.1 11/07/2020   HGB 11.9 (L) 11/07/2020   HCT 38.0 (L) 11/07/2020   MCV 93.4 11/07/2020   PLT 191 11/07/2020      Chemistry      Component Value Date/Time   NA 137 11/07/2020 1450   NA 140 09/07/2017 1322   K 5.6 (H) 11/07/2020 1450   K 5.2 No visable hemolysis (H) 09/07/2017 1322   CL 107 11/07/2020 1450   CO2 25 11/07/2020 1450   CO2 33 (H) 09/07/2017 1322   BUN 26 (H) 11/07/2020 1450   BUN 13.6 09/07/2017 1322   CREATININE 1.13 11/07/2020 1450   CREATININE 1.0 09/07/2017 1322      Component Value Date/Time   CALCIUM 9.4 11/07/2020 1450   CALCIUM 9.1 09/07/2017 1322   ALKPHOS 111 11/07/2020 1450   ALKPHOS 92 09/07/2017 1322   AST 19 11/07/2020 1450   AST 11 09/07/2017 1322   ALT 24 11/07/2020 1450   ALT 8 09/07/2017 1322   BILITOT 0.4 11/07/2020 1450   BILITOT 0.47 09/07/2017 1322       RADIOGRAPHIC STUDIES: CT Chest W Contrast  Result Date: 11/08/2020 CLINICAL DATA:  Non-small cell lung cancer. Evaluate lung nodule identified on outside CT. Chemotherapy and radiation therapy EXAM: CT CHEST WITH CONTRAST TECHNIQUE: Multidetector CT imaging of the chest was performed during intravenous contrast administration. CONTRAST:  50mL OMNIPAQUE IOHEXOL 300 MG/ML  SOLN COMPARISON:  PET-CT scan 09/20/2019, CT chest 06/14/2020 FINDINGS: Cardiovascular: No significant vascular findings. Normal heart size. No pericardial effusion. Mediastinum/Nodes: Mild paratracheal adenopathy similar prior. 16 mm RIGHT lower paratracheal lymph node is unchanged. Lungs/Pleura: Band of linear consolidation in the RIGHT upper lobe is similar to comparison exam. No new nodularity. Linear pleural calcifications the medial LEFT lower lobe unchanged. Small focus consolidation the LEFT lung base measures 16 mm is similar to 13 mm on in 43/6 5 Upper Abdomen: Limited view of the liver, kidneys, pancreas are unremarkable. Normal adrenal glands. Intermediate density lesion within the spleen measuring 2.7 cm is  stable. adrenal glands normal. Musculoskeletal: Aggressive osseous IMPRESSION: 1. Small focus consolidation in LEFT lung base is favored benign. Recommend attention on routine surveillance. 2. Stable post radiation change in the RIGHT upper lobe. 3. Stable mild paratracheal adenopathy. 4. Stable splenic lesion (non metabolic on prior FDG PET scan). Electronically Signed   By: Suzy Bouchard M.D.   On: 11/08/2020 09:22    ASSESSMENT AND PLAN:  This is a very pleasant 69 years old white male with 1) stage IIIa large B-cell non-Hodgkin lymphoma status post 6 cycles of systemic chemotherapy with CHOP/Rituxan with almost complete response. The patient has been on observation for several years. 2) stage Ia (T1c, N0, M0) non-small cell lung cancer, squamous cell carcinoma presented with right upper lobe pulmonary mass.  This was found on repeat imaging studies with CT scan of the chest as well as a PET scan that showed hypermetabolic activity in the right upper lobe lung mass with no evidence for lymphadenopathy or distant metastatic disease. The patient underwent SBRT to the recurrent non-small cell lung cancer, squamous cell carcinoma of the right upper lobe.  He tolerated the procedure well. The patient is currently on observation and he is feeling fine today with no concerning complaints. Repeat CT scan of the chest showed small focus of consolidation in the left lung base favoring to be benign. I recommended for the patient to continue on observation with repeat CT scan of the chest in 4 months for close monitoring of this area. He was advised to call immediately if he has any other concerning symptoms in the interval. The patient voices understanding of current disease status and treatment options and is in agreement with the current care plan. All questions were answered. The patient knows to call the clinic with any problems, questions or concerns. We can certainly see the patient much sooner if  necessary.  Disclaimer: This note was dictated with voice recognition software. Similar sounding words can inadvertently be transcribed and may not be corrected upon review.

## 2020-11-13 ENCOUNTER — Telehealth: Payer: Self-pay | Admitting: Internal Medicine

## 2020-11-13 NOTE — Telephone Encounter (Signed)
Scheduled appts per 2/21 los. Pt confirmed appt date/times.

## 2020-12-11 DIAGNOSIS — J018 Other acute sinusitis: Secondary | ICD-10-CM | POA: Diagnosis not present

## 2020-12-11 DIAGNOSIS — R059 Cough, unspecified: Secondary | ICD-10-CM | POA: Diagnosis not present

## 2020-12-11 DIAGNOSIS — M129 Arthropathy, unspecified: Secondary | ICD-10-CM | POA: Diagnosis not present

## 2020-12-11 DIAGNOSIS — Z79899 Other long term (current) drug therapy: Secondary | ICD-10-CM | POA: Diagnosis not present

## 2020-12-11 DIAGNOSIS — J449 Chronic obstructive pulmonary disease, unspecified: Secondary | ICD-10-CM | POA: Diagnosis not present

## 2020-12-11 DIAGNOSIS — E559 Vitamin D deficiency, unspecified: Secondary | ICD-10-CM | POA: Diagnosis not present

## 2020-12-11 DIAGNOSIS — R0602 Shortness of breath: Secondary | ICD-10-CM | POA: Diagnosis not present

## 2020-12-11 DIAGNOSIS — G8929 Other chronic pain: Secondary | ICD-10-CM | POA: Diagnosis not present

## 2020-12-11 DIAGNOSIS — E039 Hypothyroidism, unspecified: Secondary | ICD-10-CM | POA: Diagnosis not present

## 2020-12-11 DIAGNOSIS — M5416 Radiculopathy, lumbar region: Secondary | ICD-10-CM | POA: Diagnosis not present

## 2020-12-11 DIAGNOSIS — E7800 Pure hypercholesterolemia, unspecified: Secondary | ICD-10-CM | POA: Diagnosis not present

## 2020-12-11 DIAGNOSIS — E611 Iron deficiency: Secondary | ICD-10-CM | POA: Diagnosis not present

## 2020-12-13 ENCOUNTER — Inpatient Hospital Stay: Payer: PPO

## 2020-12-13 ENCOUNTER — Inpatient Hospital Stay: Payer: PPO | Attending: Internal Medicine

## 2020-12-13 ENCOUNTER — Other Ambulatory Visit: Payer: Self-pay

## 2020-12-13 DIAGNOSIS — Z79899 Other long term (current) drug therapy: Secondary | ICD-10-CM | POA: Diagnosis not present

## 2020-12-13 DIAGNOSIS — C3490 Malignant neoplasm of unspecified part of unspecified bronchus or lung: Secondary | ICD-10-CM

## 2020-12-13 DIAGNOSIS — Z95828 Presence of other vascular implants and grafts: Secondary | ICD-10-CM

## 2020-12-13 DIAGNOSIS — C3411 Malignant neoplasm of upper lobe, right bronchus or lung: Secondary | ICD-10-CM | POA: Insufficient documentation

## 2020-12-13 LAB — CBC WITH DIFFERENTIAL (CANCER CENTER ONLY)
Abs Immature Granulocytes: 0.11 10*3/uL — ABNORMAL HIGH (ref 0.00–0.07)
Basophils Absolute: 0.1 10*3/uL (ref 0.0–0.1)
Basophils Relative: 0 %
Eosinophils Absolute: 0.4 10*3/uL (ref 0.0–0.5)
Eosinophils Relative: 3 %
HCT: 34.7 % — ABNORMAL LOW (ref 39.0–52.0)
Hemoglobin: 10.9 g/dL — ABNORMAL LOW (ref 13.0–17.0)
Immature Granulocytes: 1 %
Lymphocytes Relative: 13 %
Lymphs Abs: 1.8 10*3/uL (ref 0.7–4.0)
MCH: 28.9 pg (ref 26.0–34.0)
MCHC: 31.4 g/dL (ref 30.0–36.0)
MCV: 92 fL (ref 80.0–100.0)
Monocytes Absolute: 0.7 10*3/uL (ref 0.1–1.0)
Monocytes Relative: 5 %
Neutro Abs: 10.5 10*3/uL — ABNORMAL HIGH (ref 1.7–7.7)
Neutrophils Relative %: 78 %
Platelet Count: 244 10*3/uL (ref 150–400)
RBC: 3.77 MIL/uL — ABNORMAL LOW (ref 4.22–5.81)
RDW: 13.5 % (ref 11.5–15.5)
WBC Count: 13.5 10*3/uL — ABNORMAL HIGH (ref 4.0–10.5)
nRBC: 0 % (ref 0.0–0.2)

## 2020-12-13 LAB — CMP (CANCER CENTER ONLY)
ALT: 17 U/L (ref 0–44)
AST: 17 U/L (ref 15–41)
Albumin: 3.6 g/dL (ref 3.5–5.0)
Alkaline Phosphatase: 100 U/L (ref 38–126)
Anion gap: 11 (ref 5–15)
BUN: 29 mg/dL — ABNORMAL HIGH (ref 8–23)
CO2: 26 mmol/L (ref 22–32)
Calcium: 9 mg/dL (ref 8.9–10.3)
Chloride: 105 mmol/L (ref 98–111)
Creatinine: 1.49 mg/dL — ABNORMAL HIGH (ref 0.61–1.24)
GFR, Estimated: 50 mL/min — ABNORMAL LOW (ref >60)
Glucose, Bld: 122 mg/dL — ABNORMAL HIGH (ref 70–99)
Potassium: 4.9 mmol/L (ref 3.5–5.1)
Sodium: 142 mmol/L (ref 135–145)
Total Bilirubin: 0.3 mg/dL (ref 0.3–1.2)
Total Protein: 6.7 g/dL (ref 6.5–8.1)

## 2020-12-13 MED ORDER — HEPARIN SOD (PORK) LOCK FLUSH 100 UNIT/ML IV SOLN
500.0000 [IU] | Freq: Once | INTRAVENOUS | Status: AC | PRN
  Administered 2020-12-13: 500 [IU] via INTRAVENOUS
  Filled 2020-12-13: qty 5

## 2020-12-13 MED ORDER — SODIUM CHLORIDE 0.9% FLUSH
10.0000 mL | INTRAVENOUS | Status: DC | PRN
  Administered 2020-12-13: 10 mL via INTRAVENOUS
  Filled 2020-12-13: qty 10

## 2020-12-16 ENCOUNTER — Inpatient Hospital Stay: Payer: PPO | Admitting: Internal Medicine

## 2021-01-16 DIAGNOSIS — E785 Hyperlipidemia, unspecified: Secondary | ICD-10-CM | POA: Diagnosis not present

## 2021-01-16 DIAGNOSIS — Z20822 Contact with and (suspected) exposure to covid-19: Secondary | ICD-10-CM | POA: Diagnosis not present

## 2021-01-16 DIAGNOSIS — Z6832 Body mass index (BMI) 32.0-32.9, adult: Secondary | ICD-10-CM | POA: Diagnosis not present

## 2021-01-16 DIAGNOSIS — R700 Elevated erythrocyte sedimentation rate: Secondary | ICD-10-CM | POA: Diagnosis not present

## 2021-01-16 DIAGNOSIS — Z79899 Other long term (current) drug therapy: Secondary | ICD-10-CM | POA: Diagnosis not present

## 2021-01-16 DIAGNOSIS — G8929 Other chronic pain: Secondary | ICD-10-CM | POA: Diagnosis not present

## 2021-01-16 DIAGNOSIS — M5416 Radiculopathy, lumbar region: Secondary | ICD-10-CM | POA: Diagnosis not present

## 2021-01-16 DIAGNOSIS — I10 Essential (primary) hypertension: Secondary | ICD-10-CM | POA: Diagnosis not present

## 2021-02-03 ENCOUNTER — Other Ambulatory Visit: Payer: Self-pay

## 2021-02-03 ENCOUNTER — Inpatient Hospital Stay: Payer: PPO | Attending: Internal Medicine

## 2021-02-03 DIAGNOSIS — Z95828 Presence of other vascular implants and grafts: Secondary | ICD-10-CM

## 2021-02-03 DIAGNOSIS — Z452 Encounter for adjustment and management of vascular access device: Secondary | ICD-10-CM | POA: Insufficient documentation

## 2021-02-03 DIAGNOSIS — C3411 Malignant neoplasm of upper lobe, right bronchus or lung: Secondary | ICD-10-CM | POA: Diagnosis not present

## 2021-02-03 MED ORDER — SODIUM CHLORIDE 0.9% FLUSH
10.0000 mL | INTRAVENOUS | Status: DC | PRN
  Administered 2021-02-03: 10 mL via INTRAVENOUS
  Filled 2021-02-03: qty 10

## 2021-02-03 MED ORDER — HEPARIN SOD (PORK) LOCK FLUSH 100 UNIT/ML IV SOLN
500.0000 [IU] | Freq: Once | INTRAVENOUS | Status: AC | PRN
  Administered 2021-02-03: 500 [IU] via INTRAVENOUS
  Filled 2021-02-03: qty 5

## 2021-02-14 DIAGNOSIS — M5416 Radiculopathy, lumbar region: Secondary | ICD-10-CM | POA: Diagnosis not present

## 2021-02-14 DIAGNOSIS — I10 Essential (primary) hypertension: Secondary | ICD-10-CM | POA: Diagnosis not present

## 2021-02-14 DIAGNOSIS — G8929 Other chronic pain: Secondary | ICD-10-CM | POA: Diagnosis not present

## 2021-02-14 DIAGNOSIS — Z79899 Other long term (current) drug therapy: Secondary | ICD-10-CM | POA: Diagnosis not present

## 2021-02-14 DIAGNOSIS — Z6832 Body mass index (BMI) 32.0-32.9, adult: Secondary | ICD-10-CM | POA: Diagnosis not present

## 2021-02-14 DIAGNOSIS — E039 Hypothyroidism, unspecified: Secondary | ICD-10-CM | POA: Diagnosis not present

## 2021-02-14 DIAGNOSIS — N400 Enlarged prostate without lower urinary tract symptoms: Secondary | ICD-10-CM | POA: Diagnosis not present

## 2021-03-07 ENCOUNTER — Other Ambulatory Visit: Payer: Self-pay

## 2021-03-07 DIAGNOSIS — C3491 Malignant neoplasm of unspecified part of right bronchus or lung: Secondary | ICD-10-CM

## 2021-03-10 ENCOUNTER — Other Ambulatory Visit: Payer: PPO

## 2021-03-10 ENCOUNTER — Inpatient Hospital Stay: Payer: PPO | Attending: Internal Medicine

## 2021-03-10 ENCOUNTER — Ambulatory Visit (HOSPITAL_COMMUNITY)
Admission: RE | Admit: 2021-03-10 | Discharge: 2021-03-10 | Disposition: A | Discharge status: Home / Self Care | Payer: PPO | Source: Ambulatory Visit | Attending: Internal Medicine | Admitting: Internal Medicine

## 2021-03-10 ENCOUNTER — Other Ambulatory Visit: Payer: Self-pay

## 2021-03-10 DIAGNOSIS — K219 Gastro-esophageal reflux disease without esophagitis: Secondary | ICD-10-CM | POA: Diagnosis not present

## 2021-03-10 DIAGNOSIS — Z923 Personal history of irradiation: Secondary | ICD-10-CM | POA: Insufficient documentation

## 2021-03-10 DIAGNOSIS — I509 Heart failure, unspecified: Secondary | ICD-10-CM | POA: Diagnosis not present

## 2021-03-10 DIAGNOSIS — I110 Hypertensive heart disease with heart failure: Secondary | ICD-10-CM | POA: Diagnosis not present

## 2021-03-10 DIAGNOSIS — N400 Enlarged prostate without lower urinary tract symptoms: Secondary | ICD-10-CM | POA: Diagnosis not present

## 2021-03-10 DIAGNOSIS — C3411 Malignant neoplasm of upper lobe, right bronchus or lung: Secondary | ICD-10-CM | POA: Diagnosis not present

## 2021-03-10 DIAGNOSIS — E039 Hypothyroidism, unspecified: Secondary | ICD-10-CM | POA: Diagnosis not present

## 2021-03-10 DIAGNOSIS — D3501 Benign neoplasm of right adrenal gland: Secondary | ICD-10-CM | POA: Diagnosis not present

## 2021-03-10 DIAGNOSIS — J439 Emphysema, unspecified: Secondary | ICD-10-CM | POA: Insufficient documentation

## 2021-03-10 DIAGNOSIS — I272 Pulmonary hypertension, unspecified: Secondary | ICD-10-CM | POA: Insufficient documentation

## 2021-03-10 DIAGNOSIS — Z79899 Other long term (current) drug therapy: Secondary | ICD-10-CM | POA: Diagnosis not present

## 2021-03-10 DIAGNOSIS — D751 Secondary polycythemia: Secondary | ICD-10-CM | POA: Insufficient documentation

## 2021-03-10 DIAGNOSIS — C3490 Malignant neoplasm of unspecified part of unspecified bronchus or lung: Secondary | ICD-10-CM | POA: Diagnosis not present

## 2021-03-10 DIAGNOSIS — I2510 Atherosclerotic heart disease of native coronary artery without angina pectoris: Secondary | ICD-10-CM | POA: Diagnosis not present

## 2021-03-10 DIAGNOSIS — N281 Cyst of kidney, acquired: Secondary | ICD-10-CM | POA: Diagnosis not present

## 2021-03-10 DIAGNOSIS — J449 Chronic obstructive pulmonary disease, unspecified: Secondary | ICD-10-CM | POA: Insufficient documentation

## 2021-03-10 DIAGNOSIS — D702 Other drug-induced agranulocytosis: Secondary | ICD-10-CM | POA: Insufficient documentation

## 2021-03-10 DIAGNOSIS — Z7952 Long term (current) use of systemic steroids: Secondary | ICD-10-CM | POA: Insufficient documentation

## 2021-03-10 DIAGNOSIS — Z8572 Personal history of non-Hodgkin lymphomas: Secondary | ICD-10-CM | POA: Diagnosis not present

## 2021-03-10 DIAGNOSIS — I7789 Other specified disorders of arteries and arterioles: Secondary | ICD-10-CM | POA: Diagnosis not present

## 2021-03-10 DIAGNOSIS — C3491 Malignant neoplasm of unspecified part of right bronchus or lung: Secondary | ICD-10-CM

## 2021-03-10 LAB — CBC WITH DIFFERENTIAL (CANCER CENTER ONLY)
Abs Immature Granulocytes: 0.04 10*3/uL (ref 0.00–0.07)
Basophils Absolute: 0 10*3/uL (ref 0.0–0.1)
Basophils Relative: 0 %
Eosinophils Absolute: 0.2 10*3/uL (ref 0.0–0.5)
Eosinophils Relative: 3 %
HCT: 37 % — ABNORMAL LOW (ref 39.0–52.0)
Hemoglobin: 11.6 g/dL — ABNORMAL LOW (ref 13.0–17.0)
Immature Granulocytes: 1 %
Lymphocytes Relative: 19 %
Lymphs Abs: 1.3 10*3/uL (ref 0.7–4.0)
MCH: 28.9 pg (ref 26.0–34.0)
MCHC: 31.4 g/dL (ref 30.0–36.0)
MCV: 92 fL (ref 80.0–100.0)
Monocytes Absolute: 0.7 10*3/uL (ref 0.1–1.0)
Monocytes Relative: 9 %
Neutro Abs: 4.8 10*3/uL (ref 1.7–7.7)
Neutrophils Relative %: 68 %
Platelet Count: 203 10*3/uL (ref 150–400)
RBC: 4.02 MIL/uL — ABNORMAL LOW (ref 4.22–5.81)
RDW: 14.2 % (ref 11.5–15.5)
WBC Count: 7.1 10*3/uL (ref 4.0–10.5)
nRBC: 0 % (ref 0.0–0.2)

## 2021-03-10 LAB — CMP (CANCER CENTER ONLY)
ALT: 39 U/L (ref 0–44)
AST: 22 U/L (ref 15–41)
Albumin: 3.6 g/dL (ref 3.5–5.0)
Alkaline Phosphatase: 115 U/L (ref 38–126)
Anion gap: 5 (ref 5–15)
BUN: 29 mg/dL — ABNORMAL HIGH (ref 8–23)
CO2: 30 mmol/L (ref 22–32)
Calcium: 9.1 mg/dL (ref 8.9–10.3)
Chloride: 104 mmol/L (ref 98–111)
Creatinine: 1.18 mg/dL (ref 0.61–1.24)
GFR, Estimated: >60 mL/min (ref >60)
Glucose, Bld: 97 mg/dL (ref 70–99)
Potassium: 5.2 mmol/L — ABNORMAL HIGH (ref 3.5–5.1)
Sodium: 139 mmol/L (ref 135–145)
Total Bilirubin: 0.5 mg/dL (ref 0.3–1.2)
Total Protein: 6.7 g/dL (ref 6.5–8.1)

## 2021-03-11 ENCOUNTER — Inpatient Hospital Stay: Payer: PPO | Admitting: Internal Medicine

## 2021-03-11 VITALS — BP 113/57 | HR 83 | Temp 97.5°F | Resp 20 | Wt 227.7 lb

## 2021-03-11 DIAGNOSIS — C3490 Malignant neoplasm of unspecified part of unspecified bronchus or lung: Secondary | ICD-10-CM | POA: Diagnosis not present

## 2021-03-11 DIAGNOSIS — C3411 Malignant neoplasm of upper lobe, right bronchus or lung: Secondary | ICD-10-CM | POA: Diagnosis not present

## 2021-03-11 NOTE — Progress Notes (Signed)
[name] [phone]   [phone]  OFFICE PROGRESS NOTE  [name], [place] [address] [place] [address]  DIAGNOSIS:  1) stage IA (T1c, N0, M0) non-small cell lung cancer, squamous cell carcinoma diagnosed in January 2021 2) Stage III large B-cell non-Hodgkin lymphoma diagnosed in December 2016 and presented with extensive lymphadenopathy involving the neck, chest, abdomen as well as splenomegaly.   PRIOR THERAPY:  1) Systemic chemotherapy with the CHOP/Rituxan every 3 weeks with Neulasta support. Status post 6 cycles. 2) SBRT to the right upper lobe squamous cell carcinoma under the care of Dr. Sondra Come in February 2021.[name]  CURRENT THERAPY: Observation.   INTERVAL HISTORY: [name] 70 y.o. male returns to the clinic today for 54-month follow-up visit.  The patient is feeling fine today with no concerning complaints except for the baseline shortness of breath increased with exertion.  He intentionally lost few pounds since his last visit.  He denied having any chest pain, cough or hemoptysis.  He denied having any fever or chills.  He has no nausea, vomiting, diarrhea or constipation.  He denied having any headache or visual changes.  He had repeat CT scan of the chest performed yesterday and he is here for evaluation and discussion of his discuss results.   MEDICAL HISTORY: Past Medical History:  Diagnosis Date   Arthritis    BPH (benign prostatic hyperplasia) 2011   Congestive heart failure (HCC)    COPD (chronic obstructive pulmonary disease) (HCC)    Depression    Drug-induced neutropenia (HCC) 10/08/2015   Encounter for antineoplastic chemotherapy 10/08/2015   Gastrointestinal obstruction (HCC)    Ileus   GERD (gastroesophageal reflux disease)    Gout    Hx of colonoscopy    Hypertension    Hypothyroid    Lung cancer ([place]) 2011   NHL (non-Hodgkin's lymphoma) ([place]) 09/25/2015   Obesity    Polycythemia    Pulmonary  hypertension ([place])    Sepsis ([place]) 01/14/2016   Streptococcal pneumonia [facility]) August 2009    ALLERGIES:  is allergic to omnipaque [iohexol].  MEDICATIONS:  Current Outpatient Medications  Medication Sig Dispense Refill   albuterol (PROVENTIL) (2.5 MG/3ML) 0.083% nebulizer solution Take 3 mLs (2.5 mg total) by nebulization every 4 (four) hours as needed for wheezing or shortness of breath. 75 mL 3   allopurinol (ZYLOPRIM) 300 MG tablet Take 300 mg by mouth daily.     amLODipine (NORVASC) 10 MG tablet Take 10 mg by mouth daily.     amLODipine (NORVASC) 5 MG tablet Take 5 mg by mouth daily. (Patient not taking: Reported on 11/11/2020)     betamethasone dipropionate 0.05 % lotion Apply 1 application topically 2 (two) times daily as needed (psorasis).      finasteride (PROSCAR) 5 MG tablet Take 5 mg by mouth daily.     levothyroxine (SYNTHROID, LEVOTHROID) 150 MCG tablet Take 150 mcg by mouth daily.     lisinopril (PRINIVIL,ZESTRIL) 10 MG tablet Take 1 tablet (10 mg total) by mouth daily. 30 tablet 11   omeprazole (PRILOSEC) 20 MG capsule Take 1 capsule (20 mg total) by mouth 2 (two) times daily.     oxyCODONE-acetaminophen (PERCOCET) 10-325 MG tablet Take 1 tablet by mouth every 6 (six) hours as needed for pain.  (Patient not taking: Reported on 11/11/2020)     predniSONE (DELTASONE) 50 MG tablet 1 tablet p.o. 14 hours then 7 hours then 2 hours before  the scan.  Please also take Benadryl 25 mg 2 hours before the scan. (Patient not taking: Reported on 11/11/2020) 3 tablet 0   PROAIR HFA 108 (90 Base) MCG/ACT inhaler Inhale 2 puffs into the lungs every 6 (six) hours as needed for wheezing or shortness of breath. 3 Inhaler 1   sodium chloride (OCEAN) 0.65 % SOLN nasal spray Place 1 spray into both nostrils as needed for congestion.     TRELEGY ELLIPTA 100-62.5-25 MCG/INH AEPB Inhale 1 puff into the lungs daily.     No current facility-administered medications for this visit.    SURGICAL HISTORY:   Past Surgical History:  Procedure Laterality Date   APPENDECTOMY     BACK SURGERY     CARDIOVERSION  11/27/2011   Procedure: CARDIOVERSION;  Surgeon: Thayer Headings, MD;  Location: [facility];  Service: Cardiovascular;  Laterality: N/A;   COLONOSCOPY     FUDUCIAL PLACEMENT Right 10/13/2019   Procedure: Placement Of Fuducial;  Surgeon: Collene Gobble, MD;  Location: [facility];  Service: Thoracic;  Laterality: Right;  Right Upper Lobe    LUNG CANCER SURGERY  2011   UPPER GI ENDOSCOPY     VIDEO BRONCHOSCOPY WITH ENDOBRONCHIAL NAVIGATION N/A 10/13/2019   Procedure: VIDEO BRONCHOSCOPY WITH ENDOBRONCHIAL NAVIGATION;  Surgeon: Collene Gobble, MD;  Location: [facility];  Service: Thoracic;  Laterality: N/A;   VIDEO BRONCHOSCOPY WITH ENDOBRONCHIAL ULTRASOUND N/A 10/13/2019   Procedure: VIDEO BRONCHOSCOPY WITH ENDOBRONCHIAL ULTRASOUND;  Surgeon: Collene Gobble, MD;  Location: [facility];  Service: Thoracic;  Laterality: N/A;    REVIEW OF SYSTEMS:  A comprehensive review of systems was negative except for: Constitutional: positive for fatigue Respiratory: positive for dyspnea on exertion   PHYSICAL EXAMINATION: General appearance: alert, cooperative, appears stated age, fatigued and no distress Head: Normocephalic, without obvious abnormality, atraumatic Neck: no adenopathy, no JVD, supple, symmetrical, trachea midline and thyroid not enlarged, symmetric, no tenderness/mass/nodules Lymph nodes: Cervical, supraclavicular, and axillary nodes normal. Resp: clear to auscultation bilaterally Back: symmetric, no curvature. ROM normal. No CVA tenderness. Cardio: regular rate and rhythm, S1, S2 normal, no murmur, click, rub or gallop GI: soft, non-tender; bowel sounds normal; no masses,  no organomegaly Extremities: extremities normal, atraumatic, no cyanosis or edema  ECOG PERFORMANCE STATUS: 1 - Symptomatic but completely ambulatory  Blood pressure (!) 113/57, pulse 83, temperature (!) 97.5 F (36.4 C),  temperature source Tympanic, resp. rate 20, weight 227 lb 11.2 oz (103.3 kg), SpO2 98 %.  LABORATORY DATA: Lab Results  Component Value Date   WBC 7.1 03/10/2021   HGB 11.6 (L) 03/10/2021   HCT 37.0 (L) 03/10/2021   MCV 92.0 03/10/2021   PLT 203 03/10/2021      Chemistry      Component Value Date/Time   NA 139 03/10/2021 1255   NA 140 09/07/2017 1322   K 5.2 (H) 03/10/2021 1255   K 5.2 No visable hemolysis (H) 09/07/2017 1322   CL 104 03/10/2021 1255   CO2 30 03/10/2021 1255   CO2 33 (H) 09/07/2017 1322   BUN 29 (H) 03/10/2021 1255   BUN 13.6 09/07/2017 1322   CREATININE 1.18 03/10/2021 1255   CREATININE 1.0 09/07/2017 1322      Component Value Date/Time   CALCIUM 9.1 03/10/2021 1255   CALCIUM 9.1 09/07/2017 1322   ALKPHOS 115 03/10/2021 1255   ALKPHOS 92 09/07/2017 1322   AST 22 03/10/2021 1255   AST 11 09/07/2017 1322   ALT 39 03/10/2021 1255  ALT 8 09/07/2017 1322   BILITOT 0.5 03/10/2021 1255   BILITOT 0.47 09/07/2017 1322       RADIOGRAPHIC STUDIES: CT Chest Wo Contrast  Result Date: 03/11/2021 CLINICAL DATA:  Restaging non-small cell lung cancer. EXAM: CT CHEST WITHOUT CONTRAST TECHNIQUE: Multidetector CT imaging of the chest was performed following the standard protocol without IV contrast. COMPARISON:  11/07/2020 FINDINGS: Cardiovascular: The heart is normal in size. No pericardial effusion. Stable tortuosity, mild ectasia and moderate calcification of the thoracic aorta. Stable three-vessel coronary artery calcifications. The right-sided Port-A-Cath is in good position. No complicating features are identified. Stable pulmonary artery enlargement suggesting pulmonary hypertension. Mediastinum/Nodes: Stable borderline enlarged mediastinal lymph nodes. 11.5 mm right paratracheal image 65/2 is stable. Left paratracheal/AP window node on image 78/2 measures 9 mm and is stable. 10 mm prevascular node on image 79/2 is unchanged. The esophagus is grossly. Lungs/Pleura:  Stable dense consolidated "mass" in the right upper lobe with adjacent fiducials consistent with radiation fibrosis. I do not see any definite CT findings to suggest recurrent tumor. Area of the nodularity in the left upper lobe on image number 69/7 is new but likely inflammatory change. No worrisome pulmonary lesions or pulmonary nodules to suggest metastatic disease. Stable 4 mm subpleural nodule in the left lower lobe on image number 125/7. Stable pleural calcifications involving the right hemithorax. Stable underlying emphysematous changes and pulmonary scarring. Minimal residual probable postinflammatory fibrotic changes at the left lung base. No pleural effusions. Upper Abdomen: No obvious hepatic lesions are identified without contrast. There is a stable right adrenal gland adenoma. Stable right renal cyst and right renal calculi. No upper abdominal mass or adenopathy. Stable vascular calcifications. Musculoskeletal: No chest wall mass. Stable small scattered supraclavicular lymph nodes no axillary adenopathy. The bony thorax is intact. IMPRESSION: 1. Stable radiation changes involving the right upper lobe. No findings suspicious for recurrent tumor. 2. Stable borderline enlarged mediastinal lymph nodes. No new or progressive findings. 3. No findings suspicious for pulmonary metastatic disease. Stable emphysematous changes and pulmonary scarring. 4. Enlarged pulmonary artery suggesting pulmonary hypertension. 5. Stable upper abdominal findings. No evidence of upper abdominal metastatic disease or osseous metastatic disease. Aortic Atherosclerosis (ICD10-I70.0) and Emphysema (ICD10-J43.9). Electronically Signed   By: Marijo Sanes M.D.   On: 03/11/2021 10:00     ASSESSMENT AND PLAN:  This is a very pleasant 70 years old white male with 1) stage IIIa large B-cell non-Hodgkin lymphoma status post 6 cycles of systemic chemotherapy with CHOP/Rituxan with almost complete response. The patient has been on  observation for several years. 2) stage Ia (T1c, N0, M0) non-small cell lung cancer, squamous cell carcinoma presented with right upper lobe pulmonary mass.  This was found on repeat imaging studies with CT scan of the chest as well as a PET scan that showed hypermetabolic activity in the right upper lobe lung mass with no evidence for lymphadenopathy or distant metastatic disease. The patient underwent SBRT to the recurrent non-small cell lung cancer, squamous cell carcinoma of the right upper lobe.  He tolerated the procedure well. The patient is currently on observation and he is feeling fine today with no concerning complaints. He had repeat CT scan of the chest performed yesterday.  I personally and independently reviewed the scan images and discussed the results with the patient today.  TI did not see any clear evidence for disease progression. I recommended for the patient to continue on observation with repeat CT scan of the chest in 6 months. He  was advised to call immediately if he has any other concerning symptoms in the interval. The patient voices understanding of current disease status and treatment options and is in agreement with the current care plan. All questions were answered. The patient knows to call the clinic with any problems, questions or concerns. We can certainly see the patient much sooner if necessary.  Disclaimer: This note was dictated with voice recognition software. Similar sounding words can inadvertently be transcribed and may not be corrected upon review.

## 2021-03-18 DIAGNOSIS — Z20822 Contact with and (suspected) exposure to covid-19: Secondary | ICD-10-CM | POA: Diagnosis not present

## 2021-03-18 DIAGNOSIS — J189 Pneumonia, unspecified organism: Secondary | ICD-10-CM | POA: Diagnosis not present

## 2021-03-18 DIAGNOSIS — Z20828 Contact with and (suspected) exposure to other viral communicable diseases: Secondary | ICD-10-CM | POA: Diagnosis not present

## 2021-03-21 DIAGNOSIS — E7800 Pure hypercholesterolemia, unspecified: Secondary | ICD-10-CM | POA: Diagnosis not present

## 2021-03-21 DIAGNOSIS — Z113 Encounter for screening for infections with a predominantly sexual mode of transmission: Secondary | ICD-10-CM | POA: Diagnosis not present

## 2021-03-21 DIAGNOSIS — E559 Vitamin D deficiency, unspecified: Secondary | ICD-10-CM | POA: Diagnosis not present

## 2021-03-21 DIAGNOSIS — G8929 Other chronic pain: Secondary | ICD-10-CM | POA: Diagnosis not present

## 2021-03-21 DIAGNOSIS — Z125 Encounter for screening for malignant neoplasm of prostate: Secondary | ICD-10-CM | POA: Diagnosis not present

## 2021-03-21 DIAGNOSIS — M5416 Radiculopathy, lumbar region: Secondary | ICD-10-CM | POA: Diagnosis not present

## 2021-03-21 DIAGNOSIS — M129 Arthropathy, unspecified: Secondary | ICD-10-CM | POA: Diagnosis not present

## 2021-03-21 DIAGNOSIS — E611 Iron deficiency: Secondary | ICD-10-CM | POA: Diagnosis not present

## 2021-03-21 DIAGNOSIS — R0602 Shortness of breath: Secondary | ICD-10-CM | POA: Diagnosis not present

## 2021-03-21 DIAGNOSIS — Z0000 Encounter for general adult medical examination without abnormal findings: Secondary | ICD-10-CM | POA: Diagnosis not present

## 2021-03-21 DIAGNOSIS — Z6832 Body mass index (BMI) 32.0-32.9, adult: Secondary | ICD-10-CM | POA: Diagnosis not present

## 2021-03-21 DIAGNOSIS — E039 Hypothyroidism, unspecified: Secondary | ICD-10-CM | POA: Diagnosis not present

## 2021-03-21 DIAGNOSIS — I10 Essential (primary) hypertension: Secondary | ICD-10-CM | POA: Diagnosis not present

## 2021-03-21 DIAGNOSIS — Z79899 Other long term (current) drug therapy: Secondary | ICD-10-CM | POA: Diagnosis not present

## 2021-04-07 ENCOUNTER — Inpatient Hospital Stay: Payer: PPO | Attending: Internal Medicine

## 2021-04-07 ENCOUNTER — Other Ambulatory Visit: Payer: Self-pay

## 2021-04-07 DIAGNOSIS — Z452 Encounter for adjustment and management of vascular access device: Secondary | ICD-10-CM | POA: Diagnosis not present

## 2021-04-07 DIAGNOSIS — C3411 Malignant neoplasm of upper lobe, right bronchus or lung: Secondary | ICD-10-CM | POA: Insufficient documentation

## 2021-04-07 DIAGNOSIS — Z95828 Presence of other vascular implants and grafts: Secondary | ICD-10-CM

## 2021-04-07 MED ORDER — SODIUM CHLORIDE 0.9% FLUSH
10.0000 mL | INTRAVENOUS | Status: DC | PRN
  Administered 2021-04-07: 10 mL via INTRAVENOUS
  Filled 2021-04-07: qty 10

## 2021-04-07 MED ORDER — HEPARIN SOD (PORK) LOCK FLUSH 100 UNIT/ML IV SOLN
500.0000 [IU] | Freq: Once | INTRAVENOUS | Status: AC | PRN
  Administered 2021-04-07: 500 [IU] via INTRAVENOUS
  Filled 2021-04-07: qty 5

## 2021-05-15 DIAGNOSIS — J449 Chronic obstructive pulmonary disease, unspecified: Secondary | ICD-10-CM | POA: Diagnosis not present

## 2021-06-01 ENCOUNTER — Emergency Department (HOSPITAL_COMMUNITY): Payer: Worker's Compensation

## 2021-06-01 ENCOUNTER — Other Ambulatory Visit: Payer: Self-pay

## 2021-06-01 ENCOUNTER — Encounter (HOSPITAL_COMMUNITY): Payer: Self-pay

## 2021-06-01 ENCOUNTER — Emergency Department (HOSPITAL_COMMUNITY)
Admission: EM | Admit: 2021-06-01 | Discharge: 2021-06-01 | Disposition: A | Discharge status: Home / Self Care | Payer: Worker's Compensation | Attending: Emergency Medicine | Admitting: Emergency Medicine

## 2021-06-01 DIAGNOSIS — Z87891 Personal history of nicotine dependence: Secondary | ICD-10-CM | POA: Insufficient documentation

## 2021-06-01 DIAGNOSIS — C8590 Non-Hodgkin lymphoma, unspecified, unspecified site: Secondary | ICD-10-CM | POA: Diagnosis not present

## 2021-06-01 DIAGNOSIS — E039 Hypothyroidism, unspecified: Secondary | ICD-10-CM | POA: Insufficient documentation

## 2021-06-01 DIAGNOSIS — Z79899 Other long term (current) drug therapy: Secondary | ICD-10-CM | POA: Diagnosis not present

## 2021-06-01 DIAGNOSIS — I509 Heart failure, unspecified: Secondary | ICD-10-CM | POA: Insufficient documentation

## 2021-06-01 DIAGNOSIS — I110 Hypertensive heart disease with heart failure: Secondary | ICD-10-CM | POA: Insufficient documentation

## 2021-06-01 DIAGNOSIS — S61511A Laceration without foreign body of right wrist, initial encounter: Secondary | ICD-10-CM | POA: Diagnosis not present

## 2021-06-01 DIAGNOSIS — S6991XA Unspecified injury of right wrist, hand and finger(s), initial encounter: Secondary | ICD-10-CM | POA: Diagnosis not present

## 2021-06-01 DIAGNOSIS — M7989 Other specified soft tissue disorders: Secondary | ICD-10-CM | POA: Diagnosis not present

## 2021-06-01 DIAGNOSIS — M79643 Pain in unspecified hand: Secondary | ICD-10-CM | POA: Diagnosis not present

## 2021-06-01 MED ORDER — LIDOCAINE HCL 2 % IJ SOLN
20.0000 mL | Freq: Once | INTRAMUSCULAR | Status: AC
  Administered 2021-06-01: 400 mg via INTRADERMAL
  Filled 2021-06-01: qty 20

## 2021-06-01 MED ORDER — OXYCODONE-ACETAMINOPHEN 5-325 MG PO TABS
2.0000 | ORAL_TABLET | Freq: Once | ORAL | Status: AC
  Administered 2021-06-01: 2 via ORAL
  Filled 2021-06-01: qty 2

## 2021-06-01 MED ORDER — CEPHALEXIN 500 MG PO CAPS
500.0000 mg | ORAL_CAPSULE | Freq: Two times a day (BID) | ORAL | 0 refills | Status: AC
Start: 2021-06-01 — End: 2021-06-08

## 2021-06-01 MED ORDER — ACETAMINOPHEN 325 MG PO TABS
650.0000 mg | ORAL_TABLET | Freq: Once | ORAL | Status: AC
  Administered 2021-06-01: 650 mg via ORAL
  Filled 2021-06-01: qty 2

## 2021-06-01 MED ORDER — OXYCODONE-ACETAMINOPHEN 10-325 MG PO TABS: 1.0000 | ORAL_TABLET | Freq: Four times a day (QID) | ORAL | 0 refills | Status: AC | PRN

## 2021-06-01 MED ORDER — CEPHALEXIN 500 MG PO CAPS
500.0000 mg | ORAL_CAPSULE | Freq: Once | ORAL | Status: AC
  Administered 2021-06-01: 500 mg via ORAL
  Filled 2021-06-01: qty 1
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
## 2021-06-02 ENCOUNTER — Inpatient Hospital Stay: Payer: PPO | Attending: Internal Medicine

## 2021-06-02 DIAGNOSIS — C3411 Malignant neoplasm of upper lobe, right bronchus or lung: Secondary | ICD-10-CM | POA: Insufficient documentation

## 2021-06-02 DIAGNOSIS — Z452 Encounter for adjustment and management of vascular access device: Secondary | ICD-10-CM | POA: Insufficient documentation

## 2021-06-02 DIAGNOSIS — Z95828 Presence of other vascular implants and grafts: Secondary | ICD-10-CM

## 2021-06-02 MED ORDER — SODIUM CHLORIDE 0.9% FLUSH
10.0000 mL | INTRAVENOUS | Status: DC | PRN
  Administered 2021-06-02: 10 mL via INTRAVENOUS

## 2021-06-02 MED ORDER — HEPARIN SOD (PORK) LOCK FLUSH 100 UNIT/ML IV SOLN
500.0000 [IU] | Freq: Once | INTRAVENOUS | Status: AC | PRN
  Administered 2021-06-02: 500 [IU] via INTRAVENOUS

## 2021-06-05 DIAGNOSIS — S61409A Unspecified open wound of unspecified hand, initial encounter: Secondary | ICD-10-CM | POA: Diagnosis not present

## 2021-06-05 DIAGNOSIS — J449 Chronic obstructive pulmonary disease, unspecified: Secondary | ICD-10-CM | POA: Diagnosis not present

## 2021-06-06 DIAGNOSIS — I10 Essential (primary) hypertension: Secondary | ICD-10-CM | POA: Diagnosis not present

## 2021-06-06 DIAGNOSIS — E039 Hypothyroidism, unspecified: Secondary | ICD-10-CM | POA: Diagnosis not present

## 2021-06-06 DIAGNOSIS — E785 Hyperlipidemia, unspecified: Secondary | ICD-10-CM | POA: Diagnosis not present

## 2021-06-06 DIAGNOSIS — G8929 Other chronic pain: Secondary | ICD-10-CM | POA: Diagnosis not present

## 2021-06-06 DIAGNOSIS — Z6832 Body mass index (BMI) 32.0-32.9, adult: Secondary | ICD-10-CM | POA: Diagnosis not present

## 2021-06-06 DIAGNOSIS — Z79899 Other long term (current) drug therapy: Secondary | ICD-10-CM | POA: Diagnosis not present

## 2021-06-06 DIAGNOSIS — M5416 Radiculopathy, lumbar region: Secondary | ICD-10-CM | POA: Diagnosis not present

## 2021-06-06 DIAGNOSIS — N400 Enlarged prostate without lower urinary tract symptoms: Secondary | ICD-10-CM | POA: Diagnosis not present

## 2021-06-23 DIAGNOSIS — S60922A Unspecified superficial injury of left hand, initial encounter: Secondary | ICD-10-CM | POA: Diagnosis not present

## 2021-07-04 DIAGNOSIS — M5416 Radiculopathy, lumbar region: Secondary | ICD-10-CM | POA: Diagnosis not present

## 2021-07-04 DIAGNOSIS — G8929 Other chronic pain: Secondary | ICD-10-CM | POA: Diagnosis not present

## 2021-07-04 DIAGNOSIS — Z6832 Body mass index (BMI) 32.0-32.9, adult: Secondary | ICD-10-CM | POA: Diagnosis not present

## 2021-07-04 DIAGNOSIS — E039 Hypothyroidism, unspecified: Secondary | ICD-10-CM | POA: Diagnosis not present

## 2021-07-04 DIAGNOSIS — Z79899 Other long term (current) drug therapy: Secondary | ICD-10-CM | POA: Diagnosis not present

## 2021-07-04 DIAGNOSIS — E785 Hyperlipidemia, unspecified: Secondary | ICD-10-CM | POA: Diagnosis not present

## 2021-07-04 DIAGNOSIS — N400 Enlarged prostate without lower urinary tract symptoms: Secondary | ICD-10-CM | POA: Diagnosis not present

## 2021-07-04 DIAGNOSIS — I10 Essential (primary) hypertension: Secondary | ICD-10-CM | POA: Diagnosis not present

## 2021-07-05 DIAGNOSIS — J449 Chronic obstructive pulmonary disease, unspecified: Secondary | ICD-10-CM | POA: Diagnosis not present

## 2021-07-27 DIAGNOSIS — S61401A Unspecified open wound of right hand, initial encounter: Secondary | ICD-10-CM

## 2021-07-27 HISTORY — DX: Unspecified open wound of right hand, initial encounter: S61.401A

## 2021-08-04 DIAGNOSIS — Z0000 Encounter for general adult medical examination without abnormal findings: Secondary | ICD-10-CM | POA: Diagnosis not present

## 2021-08-04 DIAGNOSIS — E039 Hypothyroidism, unspecified: Secondary | ICD-10-CM | POA: Diagnosis not present

## 2021-08-04 DIAGNOSIS — Z79899 Other long term (current) drug therapy: Secondary | ICD-10-CM | POA: Diagnosis not present

## 2021-08-04 DIAGNOSIS — Z6832 Body mass index (BMI) 32.0-32.9, adult: Secondary | ICD-10-CM | POA: Diagnosis not present

## 2021-08-04 DIAGNOSIS — R059 Cough, unspecified: Secondary | ICD-10-CM | POA: Diagnosis not present

## 2021-08-04 DIAGNOSIS — M129 Arthropathy, unspecified: Secondary | ICD-10-CM | POA: Diagnosis not present

## 2021-08-04 DIAGNOSIS — E785 Hyperlipidemia, unspecified: Secondary | ICD-10-CM | POA: Diagnosis not present

## 2021-08-04 DIAGNOSIS — R030 Elevated blood-pressure reading, without diagnosis of hypertension: Secondary | ICD-10-CM | POA: Diagnosis not present

## 2021-08-04 DIAGNOSIS — R0602 Shortness of breath: Secondary | ICD-10-CM | POA: Diagnosis not present

## 2021-08-04 DIAGNOSIS — I10 Essential (primary) hypertension: Secondary | ICD-10-CM | POA: Diagnosis not present

## 2021-08-04 DIAGNOSIS — E611 Iron deficiency: Secondary | ICD-10-CM | POA: Diagnosis not present

## 2021-08-04 DIAGNOSIS — E559 Vitamin D deficiency, unspecified: Secondary | ICD-10-CM | POA: Diagnosis not present

## 2021-08-04 DIAGNOSIS — J018 Other acute sinusitis: Secondary | ICD-10-CM | POA: Diagnosis not present

## 2021-08-04 DIAGNOSIS — M5416 Radiculopathy, lumbar region: Secondary | ICD-10-CM | POA: Diagnosis not present

## 2021-08-05 DIAGNOSIS — J449 Chronic obstructive pulmonary disease, unspecified: Secondary | ICD-10-CM | POA: Diagnosis not present

## 2021-08-06 DIAGNOSIS — Z79899 Other long term (current) drug therapy: Secondary | ICD-10-CM | POA: Diagnosis not present

## 2021-09-01 DIAGNOSIS — Z79899 Other long term (current) drug therapy: Secondary | ICD-10-CM | POA: Diagnosis not present

## 2021-09-03 DIAGNOSIS — Z79899 Other long term (current) drug therapy: Secondary | ICD-10-CM | POA: Diagnosis not present

## 2021-09-08 ENCOUNTER — Inpatient Hospital Stay: Payer: PPO | Attending: Internal Medicine

## 2021-09-08 ENCOUNTER — Inpatient Hospital Stay: Payer: PPO

## 2021-09-08 ENCOUNTER — Encounter (HOSPITAL_COMMUNITY): Payer: Self-pay

## 2021-09-08 ENCOUNTER — Ambulatory Visit (HOSPITAL_COMMUNITY)
Admission: RE | Admit: 2021-09-08 | Discharge: 2021-09-08 | Disposition: A | Discharge status: Home / Self Care | Payer: PPO | Source: Ambulatory Visit | Attending: Internal Medicine | Admitting: Internal Medicine

## 2021-09-08 ENCOUNTER — Other Ambulatory Visit: Payer: Self-pay

## 2021-09-08 DIAGNOSIS — Z9221 Personal history of antineoplastic chemotherapy: Secondary | ICD-10-CM | POA: Insufficient documentation

## 2021-09-08 DIAGNOSIS — C3411 Malignant neoplasm of upper lobe, right bronchus or lung: Secondary | ICD-10-CM | POA: Insufficient documentation

## 2021-09-08 DIAGNOSIS — I509 Heart failure, unspecified: Secondary | ICD-10-CM | POA: Insufficient documentation

## 2021-09-08 DIAGNOSIS — C3490 Malignant neoplasm of unspecified part of unspecified bronchus or lung: Secondary | ICD-10-CM | POA: Insufficient documentation

## 2021-09-08 DIAGNOSIS — Z95828 Presence of other vascular implants and grafts: Secondary | ICD-10-CM

## 2021-09-08 DIAGNOSIS — Z8572 Personal history of non-Hodgkin lymphomas: Secondary | ICD-10-CM | POA: Insufficient documentation

## 2021-09-08 DIAGNOSIS — Z7952 Long term (current) use of systemic steroids: Secondary | ICD-10-CM | POA: Insufficient documentation

## 2021-09-08 DIAGNOSIS — I110 Hypertensive heart disease with heart failure: Secondary | ICD-10-CM | POA: Insufficient documentation

## 2021-09-08 DIAGNOSIS — Z79899 Other long term (current) drug therapy: Secondary | ICD-10-CM | POA: Insufficient documentation

## 2021-09-08 DIAGNOSIS — E039 Hypothyroidism, unspecified: Secondary | ICD-10-CM | POA: Insufficient documentation

## 2021-09-08 DIAGNOSIS — N400 Enlarged prostate without lower urinary tract symptoms: Secondary | ICD-10-CM | POA: Insufficient documentation

## 2021-09-08 LAB — CMP (CANCER CENTER ONLY)
ALT: 29 U/L (ref 0–44)
AST: 20 U/L (ref 15–41)
Albumin: 3.6 g/dL (ref 3.5–5.0)
Alkaline Phosphatase: 104 U/L (ref 38–126)
Anion gap: 7 (ref 5–15)
BUN: 26 mg/dL — ABNORMAL HIGH (ref 8–23)
CO2: 27 mmol/L (ref 22–32)
Calcium: 8.8 mg/dL — ABNORMAL LOW (ref 8.9–10.3)
Chloride: 107 mmol/L (ref 98–111)
Creatinine: 1.1 mg/dL (ref 0.61–1.24)
GFR, Estimated: >60 mL/min (ref >60)
Glucose, Bld: 108 mg/dL — ABNORMAL HIGH (ref 70–99)
Potassium: 5.2 mmol/L — ABNORMAL HIGH (ref 3.5–5.1)
Sodium: 141 mmol/L (ref 135–145)
Total Bilirubin: 0.3 mg/dL (ref 0.3–1.2)
Total Protein: 6.6 g/dL (ref 6.5–8.1)

## 2021-09-08 LAB — CBC WITH DIFFERENTIAL (CANCER CENTER ONLY)
Abs Immature Granulocytes: 0.06 10*3/uL (ref 0.00–0.07)
Basophils Absolute: 0 10*3/uL (ref 0.0–0.1)
Basophils Relative: 1 %
Eosinophils Absolute: 0.2 10*3/uL (ref 0.0–0.5)
Eosinophils Relative: 3 %
HCT: 35 % — ABNORMAL LOW (ref 39.0–52.0)
Hemoglobin: 11.1 g/dL — ABNORMAL LOW (ref 13.0–17.0)
Immature Granulocytes: 1 %
Lymphocytes Relative: 17 %
Lymphs Abs: 1.2 10*3/uL (ref 0.7–4.0)
MCH: 28.5 pg (ref 26.0–34.0)
MCHC: 31.7 g/dL (ref 30.0–36.0)
MCV: 90 fL (ref 80.0–100.0)
Monocytes Absolute: 0.6 10*3/uL (ref 0.1–1.0)
Monocytes Relative: 8 %
Neutro Abs: 4.7 10*3/uL (ref 1.7–7.7)
Neutrophils Relative %: 70 %
Platelet Count: 184 10*3/uL (ref 150–400)
RBC: 3.89 MIL/uL — ABNORMAL LOW (ref 4.22–5.81)
RDW: 13.5 % (ref 11.5–15.5)
WBC Count: 6.7 10*3/uL (ref 4.0–10.5)
nRBC: 0 % (ref 0.0–0.2)

## 2021-09-08 MED ORDER — SODIUM CHLORIDE 0.9% FLUSH
10.0000 mL | INTRAVENOUS | Status: DC | PRN
  Administered 2021-09-08: 08:00:00 10 mL via INTRAVENOUS

## 2021-09-08 MED ORDER — HEPARIN SOD (PORK) LOCK FLUSH 100 UNIT/ML IV SOLN
500.0000 [IU] | Freq: Once | INTRAVENOUS | Status: AC | PRN
  Administered 2021-09-08: 08:00:00 500 [IU] via INTRAVENOUS

## 2021-09-08 NOTE — Progress Notes (Signed)
Patient stated that he did not want to remain accessed when going to C/T scan

## 2021-09-09 ENCOUNTER — Encounter: Payer: Self-pay | Admitting: Internal Medicine

## 2021-09-10 ENCOUNTER — Inpatient Hospital Stay: Payer: PPO | Admitting: Internal Medicine

## 2021-09-10 ENCOUNTER — Encounter: Payer: Self-pay | Admitting: Internal Medicine

## 2021-09-10 ENCOUNTER — Other Ambulatory Visit: Payer: Self-pay

## 2021-09-10 VITALS — BP 128/69 | HR 86 | Temp 97.5°F | Resp 19 | Ht 72.0 in | Wt 235.3 lb

## 2021-09-10 DIAGNOSIS — C3490 Malignant neoplasm of unspecified part of unspecified bronchus or lung: Secondary | ICD-10-CM | POA: Diagnosis not present

## 2021-09-10 DIAGNOSIS — Z8572 Personal history of non-Hodgkin lymphomas: Secondary | ICD-10-CM | POA: Diagnosis present

## 2021-09-10 DIAGNOSIS — N400 Enlarged prostate without lower urinary tract symptoms: Secondary | ICD-10-CM | POA: Diagnosis not present

## 2021-09-10 DIAGNOSIS — C3491 Malignant neoplasm of unspecified part of right bronchus or lung: Secondary | ICD-10-CM

## 2021-09-10 DIAGNOSIS — I509 Heart failure, unspecified: Secondary | ICD-10-CM | POA: Diagnosis not present

## 2021-09-10 DIAGNOSIS — I110 Hypertensive heart disease with heart failure: Secondary | ICD-10-CM | POA: Diagnosis not present

## 2021-09-10 DIAGNOSIS — Z9221 Personal history of antineoplastic chemotherapy: Secondary | ICD-10-CM | POA: Diagnosis not present

## 2021-09-10 DIAGNOSIS — C3411 Malignant neoplasm of upper lobe, right bronchus or lung: Secondary | ICD-10-CM | POA: Diagnosis not present

## 2021-09-10 DIAGNOSIS — Z79899 Other long term (current) drug therapy: Secondary | ICD-10-CM | POA: Diagnosis not present

## 2021-09-10 DIAGNOSIS — Z7952 Long term (current) use of systemic steroids: Secondary | ICD-10-CM | POA: Diagnosis not present

## 2021-09-10 DIAGNOSIS — E039 Hypothyroidism, unspecified: Secondary | ICD-10-CM | POA: Diagnosis not present

## 2021-09-10 NOTE — Progress Notes (Signed)
[facility] [phone]   Fax:(336) [phone]  OFFICE PROGRESS NOTE  [name], [place] [address] [place] [address]  DIAGNOSIS:  1) stage IA (T1c, N0, M0) non-small cell lung cancer, squamous cell carcinoma diagnosed in January 2021 2) Stage III large B-cell non-Hodgkin lymphoma diagnosed in December 2016 and presented with extensive lymphadenopathy involving the neck, chest, abdomen as well as splenomegaly.   PRIOR THERAPY:  1) Systemic chemotherapy with the CHOP/Rituxan every 3 weeks with Neulasta support. Status post 6 cycles. 2) SBRT to the right upper lobe squamous cell carcinoma under the care of Dr. Sondra Come in February 2021.[name]  CURRENT THERAPY: Observation.   INTERVAL HISTORY: [name] 70 y.o. male returns to the clinic today for follow-up visit.  The patient is feeling fine today with no concerning complaints except for the baseline shortness of breath and wheezing.  He is followed by Dr. Chase Caller for his COPD.  He denied having any current chest pain, cough or hemoptysis.  He denied having any fever or chills.  He has no nausea, vomiting, diarrhea or constipation.  He has no recent weight loss or night sweats.  He is here today for evaluation with repeat CT scan of the chest for restaging of his disease.  MEDICAL HISTORY: Past Medical History:  Diagnosis Date   Arthritis    BPH (benign prostatic hyperplasia) 2011   Congestive heart failure (HCC)    COPD (chronic obstructive pulmonary disease) (HCC)    Depression    Drug-induced neutropenia (HCC) 10/08/2015   Encounter for antineoplastic chemotherapy 10/08/2015   Gastrointestinal obstruction (HCC)    Ileus   GERD (gastroesophageal reflux disease)    Gout    Hx of colonoscopy    Hypertension    Hypothyroid    Lung cancer ([place]) 2011   NHL (non-Hodgkin's lymphoma) ([place]) 09/25/2015   Obesity    Polycythemia    Pulmonary hypertension ([place])    Sepsis ([place]) 01/14/2016    Streptococcal pneumonia [facility]) August 2009    ALLERGIES:  is allergic to omnipaque [iohexol].  MEDICATIONS:  Current Outpatient Medications  Medication Sig Dispense Refill   albuterol (PROVENTIL) (2.5 MG/3ML) 0.083% nebulizer solution Take 3 mLs (2.5 mg total) by nebulization every 4 (four) hours as needed for wheezing or shortness of breath. 75 mL 3   allopurinol (ZYLOPRIM) 300 MG tablet Take 300 mg by mouth daily.     amLODipine (NORVASC) 5 MG tablet Take 5 mg by mouth daily.     betamethasone dipropionate 0.05 % lotion Apply 1 application topically daily.     Cetirizine-Pseudoephedrine (ALLERGY RELIEF D PO) Take 1 tablet by mouth 2 (two) times daily.     finasteride (PROSCAR) 5 MG tablet Take 5 mg by mouth daily.     levothyroxine (SYNTHROID, LEVOTHROID) 150 MCG tablet Take 150 mcg by mouth daily.     lisinopril (PRINIVIL,ZESTRIL) 10 MG tablet Take 1 tablet (10 mg total) by mouth daily. 30 tablet 11   omeprazole (PRILOSEC) 20 MG capsule Take 1 capsule (20 mg total) by mouth 2 (two) times daily.     oxyCODONE-acetaminophen (PERCOCET) 10-325 MG tablet Take 1 tablet by mouth every 6 (six) hours as needed for pain.     predniSONE (DELTASONE) 50 MG tablet 1 tablet p.o. 14 hours then 7 hours then 2 hours before the scan.  Please also take Benadryl 25 mg 2 hours before the scan. (Patient not taking: No sig reported) 3 tablet  0   PROAIR HFA 108 (90 Base) MCG/ACT inhaler Inhale 2 puffs into the lungs every 6 (six) hours as needed for wheezing or shortness of breath. 3 Inhaler 1   sodium chloride (OCEAN) 0.65 % SOLN nasal spray Place 1 spray into both nostrils daily as needed for congestion.     TRELEGY ELLIPTA 100-62.5-25 MCG/INH AEPB Inhale 1 puff into the lungs daily.     No current facility-administered medications for this visit.    SURGICAL HISTORY:  Past Surgical History:  Procedure Laterality Date   APPENDECTOMY     BACK SURGERY     CARDIOVERSION  11/27/2011   Procedure: CARDIOVERSION;   Surgeon: Thayer Headings, MD;  Location: [facility];  Service: Cardiovascular;  Laterality: N/A;   COLONOSCOPY     FUDUCIAL PLACEMENT Right 10/13/2019   Procedure: Placement Of Fuducial;  Surgeon: Collene Gobble, MD;  Location: [facility];  Service: Thoracic;  Laterality: Right;  Right Upper Lobe    LUNG CANCER SURGERY  2011   UPPER GI ENDOSCOPY     VIDEO BRONCHOSCOPY WITH ENDOBRONCHIAL NAVIGATION N/A 10/13/2019   Procedure: VIDEO BRONCHOSCOPY WITH ENDOBRONCHIAL NAVIGATION;  Surgeon: Collene Gobble, MD;  Location: [facility];  Service: Thoracic;  Laterality: N/A;   VIDEO BRONCHOSCOPY WITH ENDOBRONCHIAL ULTRASOUND N/A 10/13/2019   Procedure: VIDEO BRONCHOSCOPY WITH ENDOBRONCHIAL ULTRASOUND;  Surgeon: Collene Gobble, MD;  [facility];  Service: Thoracic;  Laterality: N/A;    REVIEW OF SYSTEMS:  A comprehensive review of systems was negative except for: Constitutional: positive for fatigue Respiratory: positive for dyspnea on exertion and wheezing   PHYSICAL EXAMINATION: General appearance: alert, cooperative, appears stated age, fatigued and no distress Head: Normocephalic, without obvious abnormality, atraumatic Neck: no adenopathy, no JVD, supple, symmetrical, trachea midline and thyroid not enlarged, symmetric, no tenderness/mass/nodules Lymph nodes: Cervical, supraclavicular, and axillary nodes normal. Resp: clear to auscultation bilaterally Back: symmetric, no curvature. ROM normal. No CVA tenderness. Cardio: regular rate and rhythm, S1, S2 normal, no murmur, click, rub or gallop GI: soft, non-tender; bowel sounds normal; no masses,  no organomegaly Extremities: extremities normal, atraumatic, no cyanosis or edema  ECOG PERFORMANCE STATUS: 1 - Symptomatic but completely ambulatory  Blood pressure 128/69, pulse 86, temperature (!) 97.5 F (36.4 C), temperature source Tympanic, resp. rate 19, height 6' (1.829 m), weight 235 lb 4.8 oz (106.7 kg), SpO2 95 %.  LABORATORY DATA: Lab Results   Component Value Date   WBC 6.7 09/08/2021   HGB 11.1 (L) 09/08/2021   HCT 35.0 (L) 09/08/2021   MCV 90.0 09/08/2021   PLT 184 09/08/2021      Chemistry      Component Value Date/Time   NA 141 09/08/2021 0735   NA 140 09/07/2017 1322   K 5.2 (H) 09/08/2021 0735   K 5.2 No visable hemolysis (H) 09/07/2017 1322   CL 107 09/08/2021 0735   CO2 27 09/08/2021 0735   CO2 33 (H) 09/07/2017 1322   BUN 26 (H) 09/08/2021 0735   BUN 13.6 09/07/2017 1322   CREATININE 1.10 09/08/2021 0735   CREATININE 1.0 09/07/2017 1322      Component Value Date/Time   CALCIUM 8.8 (L) 09/08/2021 0735   CALCIUM 9.1 09/07/2017 1322   ALKPHOS 104 09/08/2021 0735   ALKPHOS 92 09/07/2017 1322   AST 20 09/08/2021 0735   AST 11 09/07/2017 1322   ALT 29 09/08/2021 0735   ALT 8 09/07/2017 1322   BILITOT 0.3 09/08/2021 0735   BILITOT 0.47  09/07/2017 1322       RADIOGRAPHIC STUDIES: CT Chest Wo Contrast  Result Date: 09/08/2021 CLINICAL DATA:  Non small cell lung cancer. Lung cancer diagnosed 2011. additional history of non-Hodgkin's lymphoma diagnosed 2016 EXAM: CT CHEST WITHOUT CONTRAST TECHNIQUE: Multidetector CT imaging of the chest was performed following the standard protocol without IV contrast. COMPARISON:  CT 03/10/2021 FINDINGS: Cardiovascular: Coronary artery calcification and aortic atherosclerotic calcification. Port in the anterior chest wall with tip in distal SVC. Mediastinum/Nodes: No axillary or supraclavicular adenopathy. No mediastinal or hilar adenopathy. No pericardial fluid. Esophagus normal. Stable prevascular nodes measuring 10 mm. Small RIGHT supraclavicular node measuring 6 mm. Lungs/Pleura: Band of parenchymal consolidation in the RIGHT upper lobe is unchanged from prior. The band measures 2.3 cm in coronal dimension (image 110/5) compared to 2.3 cm. No new nodularity. Upper Abdomen: Limited view of the liver, kidneys, pancreas are unremarkable. Normal adrenal glands. Spleen is normal  volume. No upper abdominal adenopathy. Benign cyst of the RIGHT kidney. Benign cyst of the LEFT kidney. Musculoskeletal: No chest wall mass or suspicious bone lesions identified. IMPRESSION: 1. Stable thickened parenchymal band in the RIGHT upper lobe at lung cancer treatment site. No evidence of local recurrence or new nodularity in the lungs. 2. Stable small mediastinal lymph nodes. Electronically Signed   By: Suzy Bouchard M.D.   On: 09/08/2021 12:18     ASSESSMENT AND PLAN:  This is a very pleasant 70 years old white male with 1) stage IIIA large B-cell non-Hodgkin lymphoma status post 6 cycles of systemic chemotherapy with CHOP/Rituxan with almost complete response. The patient has been on observation for several years. 2) stage IA (T1c, N0, M0) non-small cell lung cancer, squamous cell carcinoma presented with right upper lobe pulmonary mass.  This was found on repeat imaging studies with CT scan of the chest as well as a PET scan that showed hypermetabolic activity in the right upper lobe lung mass with no evidence for lymphadenopathy or distant metastatic disease. The patient underwent SBRT to the recurrent non-small cell lung cancer, squamous cell carcinoma of the right upper lobe.  He tolerated the procedure well. The patient is currently on observation and he is feeling fine today with no concerning complaints. He had repeat CT scan of the chest performed recently.  I personally and independently reviewed the scans and discussed the results with the patient today. Has a scan showed no concerning findings for disease recurrence or metastasis. I recommended for him to continue on observation with repeat CT scan of the chest in 1 year. The patient was advised to call immediately if he has any other concerning symptoms in the interval. The patient voices understanding of current disease status and treatment options and is in agreement with the current care plan. All questions were answered.  The patient knows to call the clinic with any problems, questions or concerns. We can certainly see the patient much sooner if necessary.  Disclaimer: This note was dictated with voice recognition software. Similar sounding words can inadvertently be transcribed and may not be corrected upon review.

## 2021-09-19 ENCOUNTER — Telehealth: Payer: Self-pay | Admitting: Internal Medicine

## 2021-09-19 NOTE — Telephone Encounter (Signed)
Sch per 12/21 los, left msg °

## 2021-09-24 DIAGNOSIS — M25641 Stiffness of right hand, not elsewhere classified: Secondary | ICD-10-CM

## 2021-09-24 HISTORY — DX: Stiffness of right hand, not elsewhere classified: M25.641

## 2021-11-11 ENCOUNTER — Telehealth: Payer: Self-pay | Admitting: Internal Medicine

## 2021-11-11 NOTE — Telephone Encounter (Signed)
.  Called patient to schedule appointment per 2/20 inbasket, patient is aware of date and time.  Pt requsted PFLUSH every two months instead of 6 weeks

## 2021-11-14 ENCOUNTER — Inpatient Hospital Stay: Payer: PPO | Attending: Internal Medicine

## 2021-11-14 ENCOUNTER — Other Ambulatory Visit: Payer: Self-pay

## 2021-11-14 DIAGNOSIS — C3411 Malignant neoplasm of upper lobe, right bronchus or lung: Secondary | ICD-10-CM | POA: Insufficient documentation

## 2021-11-14 DIAGNOSIS — Z95828 Presence of other vascular implants and grafts: Secondary | ICD-10-CM

## 2021-11-14 DIAGNOSIS — Z452 Encounter for adjustment and management of vascular access device: Secondary | ICD-10-CM | POA: Insufficient documentation

## 2021-11-14 MED ORDER — HEPARIN SOD (PORK) LOCK FLUSH 100 UNIT/ML IV SOLN
500.0000 [IU] | Freq: Once | INTRAVENOUS | Status: AC | PRN
  Administered 2021-11-14: 500 [IU] via INTRAVENOUS

## 2021-11-14 MED ORDER — SODIUM CHLORIDE 0.9% FLUSH
10.0000 mL | INTRAVENOUS | Status: DC | PRN
  Administered 2021-11-14: 10 mL via INTRAVENOUS

## 2022-01-09 ENCOUNTER — Other Ambulatory Visit: Payer: Self-pay

## 2022-01-09 ENCOUNTER — Encounter: Payer: Self-pay | Admitting: Internal Medicine

## 2022-01-09 ENCOUNTER — Inpatient Hospital Stay: Payer: PPO | Attending: Internal Medicine

## 2022-01-09 DIAGNOSIS — C3491 Malignant neoplasm of unspecified part of right bronchus or lung: Secondary | ICD-10-CM | POA: Diagnosis not present

## 2022-01-09 DIAGNOSIS — Z95828 Presence of other vascular implants and grafts: Secondary | ICD-10-CM

## 2022-01-09 DIAGNOSIS — Z452 Encounter for adjustment and management of vascular access device: Secondary | ICD-10-CM | POA: Insufficient documentation

## 2022-01-09 MED ORDER — HEPARIN SOD (PORK) LOCK FLUSH 100 UNIT/ML IV SOLN
500.0000 [IU] | Freq: Once | INTRAVENOUS | Status: AC | PRN
  Administered 2022-01-09: 500 [IU] via INTRAVENOUS

## 2022-01-09 MED ORDER — SODIUM CHLORIDE 0.9% FLUSH
10.0000 mL | INTRAVENOUS | Status: DC | PRN
  Administered 2022-01-09: 10 mL via INTRAVENOUS

## 2022-03-06 ENCOUNTER — Other Ambulatory Visit: Payer: Self-pay

## 2022-03-06 ENCOUNTER — Inpatient Hospital Stay: Payer: PPO | Attending: Internal Medicine

## 2022-03-06 DIAGNOSIS — Z85118 Personal history of other malignant neoplasm of bronchus and lung: Secondary | ICD-10-CM | POA: Diagnosis present

## 2022-03-06 DIAGNOSIS — Z8572 Personal history of non-Hodgkin lymphomas: Secondary | ICD-10-CM | POA: Diagnosis present

## 2022-03-06 DIAGNOSIS — Z452 Encounter for adjustment and management of vascular access device: Secondary | ICD-10-CM | POA: Diagnosis present

## 2022-03-06 DIAGNOSIS — Z95828 Presence of other vascular implants and grafts: Secondary | ICD-10-CM

## 2022-03-06 MED ORDER — HEPARIN SOD (PORK) LOCK FLUSH 100 UNIT/ML IV SOLN
500.0000 [IU] | Freq: Once | INTRAVENOUS | Status: AC | PRN
  Administered 2022-03-06: 500 [IU] via INTRAVENOUS

## 2022-03-06 MED ORDER — SODIUM CHLORIDE 0.9% FLUSH
10.0000 mL | INTRAVENOUS | Status: DC | PRN
  Administered 2022-03-06: 10 mL via INTRAVENOUS

## 2022-05-08 ENCOUNTER — Inpatient Hospital Stay: Payer: PPO | Attending: Internal Medicine

## 2022-05-08 ENCOUNTER — Other Ambulatory Visit: Payer: Self-pay

## 2022-05-08 DIAGNOSIS — Z95828 Presence of other vascular implants and grafts: Secondary | ICD-10-CM

## 2022-05-08 DIAGNOSIS — Z85118 Personal history of other malignant neoplasm of bronchus and lung: Secondary | ICD-10-CM | POA: Diagnosis not present

## 2022-05-08 DIAGNOSIS — Z8572 Personal history of non-Hodgkin lymphomas: Secondary | ICD-10-CM | POA: Diagnosis not present

## 2022-05-08 DIAGNOSIS — Z452 Encounter for adjustment and management of vascular access device: Secondary | ICD-10-CM | POA: Diagnosis present

## 2022-05-08 MED ORDER — SODIUM CHLORIDE 0.9% FLUSH
10.0000 mL | INTRAVENOUS | Status: DC | PRN
  Administered 2022-05-08: 10 mL via INTRAVENOUS

## 2022-05-08 MED ORDER — HEPARIN SOD (PORK) LOCK FLUSH 100 UNIT/ML IV SOLN
500.0000 [IU] | Freq: Once | INTRAVENOUS | Status: AC | PRN
  Administered 2022-05-08: 500 [IU] via INTRAVENOUS

## 2022-06-03 ENCOUNTER — Encounter: Payer: Self-pay | Admitting: Internal Medicine

## 2022-06-05 ENCOUNTER — Inpatient Hospital Stay: Payer: PPO | Attending: Internal Medicine

## 2022-06-05 ENCOUNTER — Other Ambulatory Visit: Payer: Self-pay

## 2022-06-05 DIAGNOSIS — Z8572 Personal history of non-Hodgkin lymphomas: Secondary | ICD-10-CM | POA: Diagnosis present

## 2022-06-05 DIAGNOSIS — Z95828 Presence of other vascular implants and grafts: Secondary | ICD-10-CM

## 2022-06-05 DIAGNOSIS — Z452 Encounter for adjustment and management of vascular access device: Secondary | ICD-10-CM | POA: Diagnosis present

## 2022-06-05 MED ORDER — HEPARIN SOD (PORK) LOCK FLUSH 100 UNIT/ML IV SOLN
500.0000 [IU] | Freq: Once | INTRAVENOUS | Status: AC | PRN
  Administered 2022-06-05: 500 [IU] via INTRAVENOUS

## 2022-06-05 MED ORDER — SODIUM CHLORIDE 0.9% FLUSH
10.0000 mL | INTRAVENOUS | Status: DC | PRN
  Administered 2022-06-05: 10 mL via INTRAVENOUS

## 2022-06-28 ENCOUNTER — Encounter: Payer: Self-pay | Admitting: Internal Medicine

## 2022-06-28 DIAGNOSIS — R200 Anesthesia of skin: Secondary | ICD-10-CM

## 2022-06-28 HISTORY — DX: Anesthesia of skin: R20.0

## 2022-06-30 ENCOUNTER — Telehealth: Payer: Self-pay | Admitting: Medical Oncology

## 2022-06-30 NOTE — Telephone Encounter (Signed)
Pt notified of need for CT scan and f/u appt.

## 2022-07-03 ENCOUNTER — Telehealth: Payer: Self-pay | Admitting: Internal Medicine

## 2022-07-03 ENCOUNTER — Other Ambulatory Visit: Payer: Self-pay

## 2022-07-03 ENCOUNTER — Inpatient Hospital Stay: Payer: PPO | Attending: Internal Medicine

## 2022-07-03 DIAGNOSIS — C3411 Malignant neoplasm of upper lobe, right bronchus or lung: Secondary | ICD-10-CM | POA: Insufficient documentation

## 2022-07-03 DIAGNOSIS — Z923 Personal history of irradiation: Secondary | ICD-10-CM | POA: Diagnosis not present

## 2022-07-03 DIAGNOSIS — I10 Essential (primary) hypertension: Secondary | ICD-10-CM | POA: Diagnosis not present

## 2022-07-03 DIAGNOSIS — C8338 Diffuse large B-cell lymphoma, lymph nodes of multiple sites: Secondary | ICD-10-CM | POA: Insufficient documentation

## 2022-07-03 NOTE — Telephone Encounter (Signed)
Scheduled follow-up appointments per 10/10 schedule message. Patient is aware.

## 2022-07-07 ENCOUNTER — Inpatient Hospital Stay: Payer: PPO

## 2022-07-07 ENCOUNTER — Ambulatory Visit (HOSPITAL_COMMUNITY)
Admission: RE | Admit: 2022-07-07 | Discharge: 2022-07-07 | Disposition: A | Discharge status: Home / Self Care | Payer: PPO | Source: Ambulatory Visit | Attending: Internal Medicine | Admitting: Internal Medicine

## 2022-07-07 ENCOUNTER — Other Ambulatory Visit: Payer: Self-pay

## 2022-07-07 DIAGNOSIS — C3490 Malignant neoplasm of unspecified part of unspecified bronchus or lung: Secondary | ICD-10-CM

## 2022-07-07 DIAGNOSIS — Z95828 Presence of other vascular implants and grafts: Secondary | ICD-10-CM

## 2022-07-07 LAB — CBC WITH DIFFERENTIAL (CANCER CENTER ONLY)
Abs Immature Granulocytes: 0.14 10*3/uL — ABNORMAL HIGH (ref 0.00–0.07)
Basophils Absolute: 0 10*3/uL (ref 0.0–0.1)
Basophils Relative: 0 %
Eosinophils Absolute: 0.3 10*3/uL (ref 0.0–0.5)
Eosinophils Relative: 3 %
HCT: 37.8 % — ABNORMAL LOW (ref 39.0–52.0)
Hemoglobin: 12.2 g/dL — ABNORMAL LOW (ref 13.0–17.0)
Immature Granulocytes: 1 %
Lymphocytes Relative: 15 %
Lymphs Abs: 1.4 10*3/uL (ref 0.7–4.0)
MCH: 29.5 pg (ref 26.0–34.0)
MCHC: 32.3 g/dL (ref 30.0–36.0)
MCV: 91.5 fL (ref 80.0–100.0)
Monocytes Absolute: 0.7 10*3/uL (ref 0.1–1.0)
Monocytes Relative: 7 %
Neutro Abs: 7.1 10*3/uL (ref 1.7–7.7)
Neutrophils Relative %: 74 %
Platelet Count: 219 10*3/uL (ref 150–400)
RBC: 4.13 MIL/uL — ABNORMAL LOW (ref 4.22–5.81)
RDW: 14 % (ref 11.5–15.5)
WBC Count: 9.7 10*3/uL (ref 4.0–10.5)
nRBC: 0 % (ref 0.0–0.2)

## 2022-07-07 LAB — CMP (CANCER CENTER ONLY)
ALT: 12 U/L (ref 0–44)
AST: 14 U/L — ABNORMAL LOW (ref 15–41)
Albumin: 4 g/dL (ref 3.5–5.0)
Alkaline Phosphatase: 88 U/L (ref 38–126)
Anion gap: 3 — ABNORMAL LOW (ref 5–15)
BUN: 17 mg/dL (ref 8–23)
CO2: 32 mmol/L (ref 22–32)
Calcium: 8.9 mg/dL (ref 8.9–10.3)
Chloride: 102 mmol/L (ref 98–111)
Creatinine: 1.47 mg/dL — ABNORMAL HIGH (ref 0.61–1.24)
GFR, Estimated: 51 mL/min — ABNORMAL LOW (ref >60)
Glucose, Bld: 101 mg/dL — ABNORMAL HIGH (ref 70–99)
Potassium: 5.4 mmol/L — ABNORMAL HIGH (ref 3.5–5.1)
Sodium: 137 mmol/L (ref 135–145)
Total Bilirubin: 0.6 mg/dL (ref 0.3–1.2)
Total Protein: 6.7 g/dL (ref 6.5–8.1)

## 2022-07-07 MED ORDER — SODIUM CHLORIDE 0.9% FLUSH
10.0000 mL | INTRAVENOUS | Status: DC | PRN
  Administered 2022-07-07: 10 mL via INTRAVENOUS

## 2022-07-07 MED ORDER — HEPARIN SOD (PORK) LOCK FLUSH 100 UNIT/ML IV SOLN
500.0000 [IU] | Freq: Once | INTRAVENOUS | Status: AC | PRN
  Administered 2022-07-07: 500 [IU] via INTRAVENOUS

## 2022-07-09 ENCOUNTER — Other Ambulatory Visit: Payer: Self-pay

## 2022-07-09 ENCOUNTER — Inpatient Hospital Stay (HOSPITAL_BASED_OUTPATIENT_CLINIC_OR_DEPARTMENT_OTHER): Payer: PPO | Admitting: Internal Medicine

## 2022-07-09 VITALS — BP 150/56 | HR 85 | Temp 98.2°F | Resp 15 | Wt 225.9 lb

## 2022-07-09 DIAGNOSIS — C8258 Diffuse follicle center lymphoma, lymph nodes of multiple sites: Secondary | ICD-10-CM

## 2022-07-09 DIAGNOSIS — C3411 Malignant neoplasm of upper lobe, right bronchus or lung: Secondary | ICD-10-CM | POA: Diagnosis not present

## 2022-07-09 NOTE — Progress Notes (Signed)
[facility] Telephone:([phone]   Fax:(336) [phone]  OFFICE PROGRESS NOTE  [name], [place] [address] [place] [address]  DIAGNOSIS:  1) stage IA (T1c, N0, M0) non-small cell lung cancer, squamous cell carcinoma diagnosed in January 2021 2) Stage III large B-cell non-Hodgkin lymphoma diagnosed in December 2016 and presented with extensive lymphadenopathy involving the neck, chest, abdomen as well as splenomegaly.   PRIOR THERAPY:  1) Systemic chemotherapy with the CHOP/Rituxan every 3 weeks with Neulasta support. Status post 6 cycles. 2) SBRT to the right upper lobe squamous cell carcinoma under the care of Dr. Sondra Come in February 2021.[name]  CURRENT THERAPY: Observation.   INTERVAL HISTORY: [name] 71 y.o. male returns to the clinic today for follow-up visit.  The patient was seen at outside facility complaining of shortness of breath and treated for pneumonia.  He had a chest x-ray in that facility that showed suspicious mass in the right lung and he was referred to me today for evaluation and recommendation regarding this abnormality.  He denied having any current chest pain but continues to have mild cough and shortness of breath with exertion with no hemoptysis.  He has no fever or chills.  He has no nausea, vomiting, diarrhea or constipation.  He has no headache or visual changes.  He had repeat CT scan of the chest performed recently and he is here for evaluation and discussion of his scan results and recommendation regarding his condition.  MEDICAL HISTORY: Past Medical History:  Diagnosis Date   Arthritis    BPH (benign prostatic hyperplasia) 2011   Congestive heart failure (HCC)    COPD (chronic obstructive pulmonary disease) (HCC)    Depression    Drug-induced neutropenia (HCC) 10/08/2015   Encounter for antineoplastic chemotherapy 10/08/2015   Gastrointestinal obstruction (HCC)    Ileus   GERD (gastroesophageal reflux disease)     Gout    Hx of colonoscopy    Hypertension    Hypothyroid    Lung cancer ([place]) 2011   NHL (non-Hodgkin's lymphoma) ([place]) 09/25/2015   Obesity    Polycythemia    Pulmonary hypertension ([place])    Sepsis ([place]) 01/14/2016   Streptococcal pneumonia [facility]) August 2009    ALLERGIES:  is allergic to omnipaque [iohexol].  MEDICATIONS:  Current Outpatient Medications  Medication Sig Dispense Refill   albuterol (PROVENTIL) (2.5 MG/3ML) 0.083% nebulizer solution Take 3 mLs (2.5 mg total) by nebulization every 4 (four) hours as needed for wheezing or shortness of breath. 75 mL 3   allopurinol (ZYLOPRIM) 300 MG tablet Take 300 mg by mouth daily.     amLODipine (NORVASC) 5 MG tablet Take 5 mg by mouth daily.     betamethasone dipropionate 0.05 % lotion Apply 1 application topically daily.     Cetirizine-Pseudoephedrine (ALLERGY RELIEF D PO) Take 1 tablet by mouth 2 (two) times daily.     finasteride (PROSCAR) 5 MG tablet Take 5 mg by mouth daily.     levothyroxine (SYNTHROID, LEVOTHROID) 150 MCG tablet Take 150 mcg by mouth daily.     lisinopril (PRINIVIL,ZESTRIL) 10 MG tablet Take 1 tablet (10 mg total) by mouth daily. 30 tablet 11   omeprazole (PRILOSEC) 20 MG capsule Take 1 capsule (20 mg total) by mouth 2 (two) times daily.     oxyCODONE-acetaminophen (PERCOCET) 10-325 MG tablet Take 1 tablet by mouth every 6 (six) hours as needed for pain.     predniSONE (DELTASONE) 50  MG tablet 1 tablet p.o. 14 hours then 7 hours then 2 hours before the scan.  Please also take Benadryl 25 mg 2 hours before the scan. (Patient not taking: No sig reported) 3 tablet 0   PROAIR HFA 108 (90 Base) MCG/ACT inhaler Inhale 2 puffs into the lungs every 6 (six) hours as needed for wheezing or shortness of breath. 3 Inhaler 1   sodium chloride (OCEAN) 0.65 % SOLN nasal spray Place 1 spray into both nostrils daily as needed for congestion.     TRELEGY ELLIPTA 100-62.5-25 MCG/INH AEPB Inhale 1 puff into the lungs daily.     No  current facility-administered medications for this visit.    SURGICAL HISTORY:  Past Surgical History:  Procedure Laterality Date   APPENDECTOMY     BACK SURGERY     CARDIOVERSION  11/27/2011   Procedure: CARDIOVERSION;  Surgeon: Thayer Headings, MD;  Location: [facility];  Service: Cardiovascular;  Laterality: N/A;   COLONOSCOPY     FUDUCIAL PLACEMENT Right 10/13/2019   Procedure: Placement Of Fuducial;  Surgeon: Collene Gobble, MD;  Location: [facility];  Service: Thoracic;  Laterality: Right;  Right Upper Lobe    LUNG CANCER SURGERY  2011   UPPER GI ENDOSCOPY     VIDEO BRONCHOSCOPY WITH ENDOBRONCHIAL NAVIGATION N/A 10/13/2019   Procedure: VIDEO BRONCHOSCOPY WITH ENDOBRONCHIAL NAVIGATION;  Surgeon: Collene Gobble, MD;  Location: [facility];  Service: Thoracic;  Laterality: N/A;   VIDEO BRONCHOSCOPY WITH ENDOBRONCHIAL ULTRASOUND N/A 10/13/2019   Procedure: VIDEO BRONCHOSCOPY WITH ENDOBRONCHIAL ULTRASOUND;  Surgeon: Collene Gobble, MD;  Location[facility];  Service: Thoracic;  Laterality: N/A;    REVIEW OF SYSTEMS:  A comprehensive review of systems was negative except for: Constitutional: positive for fatigue Respiratory: positive for cough and dyspnea on exertion   PHYSICAL EXAMINATION: General appearance: alert, cooperative, appears stated age, fatigued and no distress Head: Normocephalic, without obvious abnormality, atraumatic Neck: no adenopathy, no JVD, supple, symmetrical, trachea midline and thyroid not enlarged, symmetric, no tenderness/mass/nodules Lymph nodes: Cervical, supraclavicular, and axillary nodes normal. Resp: clear to auscultation bilaterally Back: symmetric, no curvature. ROM normal. No CVA tenderness. Cardio: regular rate and rhythm, S1, S2 normal, no murmur, click, rub or gallop GI: soft, non-tender; bowel sounds normal; no masses,  no organomegaly Extremities: extremities normal, atraumatic, no cyanosis or edema  ECOG PERFORMANCE STATUS: 1 - Symptomatic but completely  ambulatory  Blood pressure (!) 150/56, pulse 85, temperature 98.2 F (36.8 C), temperature source Oral, resp. rate 15, weight 225 lb 14.4 oz (102.5 kg), SpO2 93 %.  LABORATORY DATA: Lab Results  Component Value Date   WBC 9.7 07/07/2022   HGB 12.2 (L) 07/07/2022   HCT 37.8 (L) 07/07/2022   MCV 91.5 07/07/2022   PLT 219 07/07/2022      Chemistry      Component Value Date/Time   NA 137 07/07/2022 1444   NA 140 09/07/2017 1322   K 5.4 (H) 07/07/2022 1444   K 5.2 No visable hemolysis (H) 09/07/2017 1322   CL 102 07/07/2022 1444   CO2 32 07/07/2022 1444   CO2 33 (H) 09/07/2017 1322   BUN 17 07/07/2022 1444   BUN 13.6 09/07/2017 1322   CREATININE 1.47 (H) 07/07/2022 1444   CREATININE 1.0 09/07/2017 1322      Component Value Date/Time   CALCIUM 8.9 07/07/2022 1444   CALCIUM 9.1 09/07/2017 1322   ALKPHOS 88 07/07/2022 1444   ALKPHOS 92 09/07/2017 1322   AST  14 (L) 07/07/2022 1444   AST 11 09/07/2017 1322   ALT 12 07/07/2022 1444   ALT 8 09/07/2017 1322   BILITOT 0.6 07/07/2022 1444   BILITOT 0.47 09/07/2017 1322       RADIOGRAPHIC STUDIES: CT Chest Wo Contrast  Result Date: 07/08/2022 CLINICAL DATA:  71 year old male presents for evaluation of non-small cell lung cancer on follow-up. EXAM: CT CHEST WITHOUT CONTRAST TECHNIQUE: Multidetector CT imaging of the chest was performed following the standard protocol without IV contrast. RADIATION DOSE REDUCTION: This exam was performed according to the departmental dose-optimization program which includes automated exposure control, adjustment of the mA and/or kV according to patient size and/or use of iterative reconstruction technique. COMPARISON:  September 08, 2021. FINDINGS: Cardiovascular: RIGHT IJ Port-A-Cath terminates in the low SVC. Main pulmonary artery is dilated to 4 cm. Heart size is stable without substantial pericardial effusion or signs of pericardial nodularity or thickening. Aortic atherosclerotic plaque with  calcification. No aneurysmal dilation of the thoracic aorta. Mediastinum/Nodes: No adenopathy in the mediastinum. 9 mm prevascular lymph node is stable as are small RIGHT paratracheal lymph nodes the largest of which measures 10 mm along the superior RIGHT paratracheal chain previously 10 mm short axis. Superior mediastinal lymph node (image 43/2) 10 mm similarly stable. No gross hilar lymphadenopathy. Esophagus is grossly normal. Lungs/Pleura: Confluent RIGHT upper lobe masslike consolidation related to post treatment changes is similar to prior imaging. There is slight increased septal thickening in the RIGHT lung apex above the area of post treatment. Calcified pleural plaques in the RIGHT chest similar to prior imaging most notably along the RIGHT hemidiaphragm. Small amount of material in RIGHT bronchus intermedius and mainstem bronchus. Airways are otherwise unremarkable the aerated portions of the lung. Postoperative changes again noted in the LEFT upper lobe. No signs of consolidation or evidence of pleural effusion. Potential new small nodule in the LEFT lung base (image 138/7) 4 mm. Stable small nodule in the LEFT lower lobe (image 111/7) 3 mm. Upper Abdomen: Nephrolithiasis in the upper pole the RIGHT kidney similar to prior imaging. No acute upper abdominal process, imaged portions of liver, gallbladder, pancreas, spleen, adrenal glands and kidneys are stable compared to previous imaging. Musculoskeletal: No acute musculoskeletal process. No destructive bone findings. IMPRESSION: 1. Confluent RIGHT upper lobe masslike consolidation related to post treatment changes is similar to prior imaging. There is slight increased septal thickening in the RIGHT lung apex above the area of post treatment changes. Findings likely reflect evolving post treatment changes with stable appearance of the area of masslike consolidation. Attention on follow-up. 2. Potential new small nodule in the LEFT lung base 4 mm. Could  consider short interval follow-up to ensure stability, this could also be sequela of mild infection or inflammation but remains nonspecific. 3. Calcified pleural plaques in the RIGHT chest similar to prior imaging most notably along the RIGHT hemidiaphragm. 4. Nephrolithiasis in the upper pole the RIGHT kidney similar to prior imaging. 5. Aortic atherosclerosis. 6. Dilated main pulmonary artery to 4 cm, can be seen in the setting of pulmonary arterial hypertension. Aortic Atherosclerosis (ICD10-I70.0). Electronically Signed   By: Zetta Bills M.D.   On: 07/08/2022 16:53     ASSESSMENT AND PLAN:  This is a very pleasant 71 years old white male with 1) stage IIIA large B-cell non-Hodgkin lymphoma status post 6 cycles of systemic chemotherapy with CHOP/Rituxan with almost complete response. The patient has been on observation for several years. 2) stage IA (T1c, N0, M0) non-small  cell lung cancer, squamous cell carcinoma presented with right upper lobe pulmonary mass.  This was found on repeat imaging studies with CT scan of the chest as well as a PET scan that showed hypermetabolic activity in the right upper lobe lung mass with no evidence for lymphadenopathy or distant metastatic disease. The patient underwent SBRT to the recurrent non-small cell lung cancer, squamous cell carcinoma of the right upper lobe.  He tolerated the procedure well. The patient is currently on observation and he is feeling fine with no concerning complaints except for mild cough and shortness of breath. He had repeat CT scan performed on July 08, 2022 and that showed the confluent right upper lobe masslike consolidation related to the posttreatment changes and similar to the prior imaging with increased septal thickening in the right lung apex.  There was also a tiny 0.4 cm left lung base nodule that likely inflammatory but need monitoring. I recommended for the patient to continue on observation with repeat blood work in  April 2024.  He was advised to call immediately if he has any other concerning symptoms in the interval. The patient voices understanding of current disease status and treatment options and is in agreement with the current care plan. All questions were answered. The patient knows to call the clinic with any problems, questions or concerns. We can certainly see the patient much sooner if necessary.  Disclaimer: This note was dictated with voice recognition software. Similar sounding words can inadvertently be transcribed and may not be corrected upon review.

## 2022-07-10 ENCOUNTER — Emergency Department (HOSPITAL_COMMUNITY)
Admission: EM | Admit: 2022-07-10 | Discharge: 2022-07-11 | Disposition: A | Discharge status: Home / Self Care | Payer: PPO | Attending: Emergency Medicine | Admitting: Emergency Medicine

## 2022-07-10 ENCOUNTER — Encounter (HOSPITAL_COMMUNITY): Payer: Self-pay

## 2022-07-10 ENCOUNTER — Other Ambulatory Visit: Payer: Self-pay

## 2022-07-10 ENCOUNTER — Emergency Department (HOSPITAL_COMMUNITY): Payer: PPO

## 2022-07-10 DIAGNOSIS — I509 Heart failure, unspecified: Secondary | ICD-10-CM | POA: Diagnosis not present

## 2022-07-10 DIAGNOSIS — J449 Chronic obstructive pulmonary disease, unspecified: Secondary | ICD-10-CM | POA: Insufficient documentation

## 2022-07-10 DIAGNOSIS — R197 Diarrhea, unspecified: Secondary | ICD-10-CM | POA: Insufficient documentation

## 2022-07-10 DIAGNOSIS — R112 Nausea with vomiting, unspecified: Secondary | ICD-10-CM | POA: Diagnosis not present

## 2022-07-10 DIAGNOSIS — R1110 Vomiting, unspecified: Secondary | ICD-10-CM | POA: Diagnosis present

## 2022-07-10 DIAGNOSIS — D72829 Elevated white blood cell count, unspecified: Secondary | ICD-10-CM | POA: Insufficient documentation

## 2022-07-10 DIAGNOSIS — E039 Hypothyroidism, unspecified: Secondary | ICD-10-CM | POA: Diagnosis not present

## 2022-07-10 DIAGNOSIS — I110 Hypertensive heart disease with heart failure: Secondary | ICD-10-CM | POA: Diagnosis not present

## 2022-07-10 DIAGNOSIS — Z79899 Other long term (current) drug therapy: Secondary | ICD-10-CM | POA: Insufficient documentation

## 2022-07-10 LAB — COMPREHENSIVE METABOLIC PANEL
ALT: 15 U/L (ref 0–44)
AST: 19 U/L (ref 15–41)
Albumin: 4.4 g/dL (ref 3.5–5.0)
Alkaline Phosphatase: 86 U/L (ref 38–126)
Anion gap: 6 (ref 5–15)
BUN: 22 mg/dL (ref 8–23)
CO2: 28 mmol/L (ref 22–32)
Calcium: 9.2 mg/dL (ref 8.9–10.3)
Chloride: 102 mmol/L (ref 98–111)
Creatinine, Ser: 1.48 mg/dL — ABNORMAL HIGH (ref 0.61–1.24)
GFR, Estimated: 50 mL/min — ABNORMAL LOW (ref >60)
Glucose, Bld: 115 mg/dL — ABNORMAL HIGH (ref 70–99)
Potassium: 5.3 mmol/L — ABNORMAL HIGH (ref 3.5–5.1)
Sodium: 136 mmol/L (ref 135–145)
Total Bilirubin: 0.7 mg/dL (ref 0.3–1.2)
Total Protein: 7.7 g/dL (ref 6.5–8.1)

## 2022-07-10 LAB — CBC WITH DIFFERENTIAL/PLATELET
Abs Immature Granulocytes: 0.21 10*3/uL — ABNORMAL HIGH (ref 0.00–0.07)
Basophils Absolute: 0.1 10*3/uL (ref 0.0–0.1)
Basophils Relative: 0 %
Eosinophils Absolute: 0.3 10*3/uL (ref 0.0–0.5)
Eosinophils Relative: 1 %
HCT: 45 % (ref 39.0–52.0)
Hemoglobin: 13.8 g/dL (ref 13.0–17.0)
Immature Granulocytes: 1 %
Lymphocytes Relative: 3 %
Lymphs Abs: 0.6 10*3/uL — ABNORMAL LOW (ref 0.7–4.0)
MCH: 28.7 pg (ref 26.0–34.0)
MCHC: 30.7 g/dL (ref 30.0–36.0)
MCV: 93.6 fL (ref 80.0–100.0)
Monocytes Absolute: 1.4 10*3/uL — ABNORMAL HIGH (ref 0.1–1.0)
Monocytes Relative: 6 %
Neutro Abs: 19.1 10*3/uL — ABNORMAL HIGH (ref 1.7–7.7)
Neutrophils Relative %: 89 %
Platelets: 250 10*3/uL (ref 150–400)
RBC: 4.81 MIL/uL (ref 4.22–5.81)
RDW: 13.8 % (ref 11.5–15.5)
WBC: 21.6 10*3/uL — ABNORMAL HIGH (ref 4.0–10.5)
nRBC: 0 % (ref 0.0–0.2)

## 2022-07-10 LAB — LIPASE, BLOOD: Lipase: 39 U/L (ref 11–51)

## 2022-07-10 NOTE — ED Provider Triage Note (Signed)
Emergency Medicine Provider Triage Evaluation Note  [name] , a 71 y.o. male  was evaluated in triage.  Pt complains of yellow vomiting and constipation.  He has a history of lung cancer and lymphoma with previous therapies, but currently just being monitored.  Reports 6 days of constipation.  Tried oral laxatives and enema without improvement.  Today he began having vomiting, fluorescent yellow material.  Unable to tolerate orals.  History of appendectomy in childhood.  Review of Systems  Positive: Vomiting Negative: Fever, abdominal pain  Physical Exam  BP 119/71 (BP Location: Right Arm)   Pulse 87   Temp 97.9 F (36.6 C) (Oral)   Resp 16   SpO2 93%  Gen:   Awake, no distress   Resp:  Normal effort  MSK:   Moves extremities without difficulty  Other:  Abdomen nontender  Medical Decision Making  Medically screening exam initiated at 4:25 PM.  Appropriate orders placed.  [name] was informed that the remainder of the evaluation will be completed by another provider, this initial triage assessment does not replace that evaluation, and the importance of remaining in the ED until their evaluation is complete.     Carlisle Cater, PA-C 07/10/22 1626

## 2022-07-10 NOTE — ED Triage Notes (Signed)
Patient c/o constipation and states his lst normal BM was 9 days ago. Patient has been taking laxatives and states he had a small stool today. Patient also c/o vomiting, but denies abdominal pain.

## 2022-07-11 LAB — URINALYSIS, ROUTINE W REFLEX MICROSCOPIC
Glucose, UA: NEGATIVE mg/dL
Hgb urine dipstick: NEGATIVE
Ketones, ur: 15 mg/dL — AB
Leukocytes,Ua: NEGATIVE
Nitrite: NEGATIVE
Protein, ur: NEGATIVE mg/dL
Specific Gravity, Urine: >1.03 — ABNORMAL HIGH (ref 1.005–1.030)
pH: 5.5 (ref 5.0–8.0)

## 2022-07-11 MED ORDER — ONDANSETRON 4 MG PO TBDP: 4.0000 mg | ORAL_TABLET | Freq: Three times a day (TID) | ORAL | 0 refills | Status: AC | PRN

## 2022-07-11 NOTE — ED Provider Notes (Signed)
[facility] DEPT Provider Note  CSN: [id number] Arrival date & time: 07/10/22 1606  Chief Complaint(s) Emesis and Constipation  HPI [name] is a 71 y.o. male with a past medical history listed below including non-Hodgkin's lymphoma, lung cancer status postradiation who presents to the emergency department with 1 day of several bouts of nonbloody nonbilious emesis with abdominal cramping.  No known suspicious food intake or sick contacts.  Patient was started on antibiotics for possible pneumonia at an urgent care a few weeks ago.  He followed up with his oncologist recently but the changes seen on x-ray at urgent care were consistent with his known scarring from radiation.  Patient also states that he has been having constipation for 9 days.  He did report having a bowel movement today during one of the bouts of emesis.  No known fevers or chills.  No new coughing or congestion.  No chest pain or shortness of breath.  No other physical complaints.  The history is provided by the patient.    Past Medical History Past Medical History:  Diagnosis Date   Arthritis    BPH (benign prostatic hyperplasia) 2011   Congestive heart failure (HCC)    COPD (chronic obstructive pulmonary disease) (HCC)    Depression    Drug-induced neutropenia ([place]) 10/08/2015   Encounter for antineoplastic chemotherapy 10/08/2015   Gastrointestinal obstruction (HCC)    Ileus   GERD (gastroesophageal reflux disease)    Gout    Hx of colonoscopy    Hypertension    Hypothyroid    Lung cancer ([place]) 2011   NHL (non-Hodgkin's lymphoma) ([place]) 09/25/2015   Obesity    Polycythemia    Pulmonary hypertension ([place])    Sepsis ([place]) 01/14/2016   Streptococcal pneumonia ([place]) August 2009   Patient Active Problem List   Diagnosis Date Noted   Primary cancer of right upper lobe of lung ([place]) 10/25/2019   Status post bronchoscopy with biopsy    Pulmonary nodule, right 10/03/2019   Port  catheter in place 08/20/2016   Sepsis ([place]) 01/14/2016   AKI (acute kidney injury) ([place]) 01/14/2016   Severe sepsis ([place]) 01/14/2016   COPD, severe ([place]) 11/27/2015   Adjustment disorder with other symptom 11/27/2015   Encounter for antineoplastic chemotherapy 10/08/2015   Drug-induced neutropenia ([place]) 38/06/1750   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) 09/25/2015   Lymphadenopathy syndrome 09/12/2015   Mediastinal adenopathy 09/12/2015   Testicular swelling 09/12/2015   Chronic respiratory failure with hypoxia ([place]) 08/27/2015   Gout attack 08/29/2012   Hypothyroidism 03/30/2012   Recurrent pneumonia 02/10/2012   Routine general medical examination at a health care facility 12/03/2011   Depression 12/03/2011   Unspecified hypothyroidism 12/03/2011   Pure hypercholesterolemia 12/03/2011   Atrial fibrillation ([place]) 10/26/2011   Post-nasal drip 10/04/2011   Other dysphagia 11/11/2010   Squamous cell carcinoma lung ([place]) 09/26/2010   BRONCHOPNEUMONIA ORGANISM UNSPECIFIED 06/12/2010   ERECTILE DYSFUNCTION 09/24/2008   POLYCYTHEMIA 06/05/2008   Essential hypertension 06/05/2008   COPD (chronic obstructive pulmonary disease) ([place]) 06/05/2008   Home Medication(s) Prior to Admission medications   Medication Sig Start Date End Date Taking? Authorizing Provider  albuterol (PROVENTIL) (2.5 MG/3ML) 0.083% nebulizer solution Take 3 mLs (2.5 mg total) by nebulization every 4 (four) hours as needed for wheezing or shortness of breath. 10/02/17  Yes Florencia Reasons, MD  allopurinol (ZYLOPRIM) 300 MG tablet Take 300 mg by mouth  daily. 01/15/17  Yes [provider]  amLODipine (NORVASC) 5 MG tablet Take 5 mg by mouth daily. 01/21/20  Yes [provider]  Buprenorphine HCl-Naloxone HCl 8-2 MG FILM Place under the tongue 2 (two) times daily. 06/08/22  Yes [provider]  cetirizine (ZYRTEC) 10 MG tablet Take  10 mg by mouth daily. 06/07/22  Yes [provider]  finasteride (PROSCAR) 5 MG tablet Take 5 mg by mouth daily. 03/13/19  Yes [provider]  levothyroxine (SYNTHROID, LEVOTHROID) 150 MCG tablet Take 150 mcg by mouth daily. 11/07/15  Yes [provider]  lisinopril (PRINIVIL,ZESTRIL) 10 MG tablet Take 1 tablet (10 mg total) by mouth daily. 12/29/11  Yes Nahser, Wonda Cheng, MD  omeprazole (PRILOSEC) 20 MG capsule Take 1 capsule (20 mg total) by mouth 2 (two) times daily. 10/13/19  Yes Collene Gobble, MD  ondansetron (ZOFRAN-ODT) 4 MG disintegrating tablet Take 1 tablet (4 mg total) by mouth every 8 (eight) hours as needed for up to 3 days for nausea or vomiting. 07/11/22 07/14/22 Yes Hezikiah Retzloff, Grayce Sessions, MD  sodium chloride (OCEAN) 0.65 % SOLN nasal spray Place 1 spray into both nostrils daily as needed for congestion.   Yes [provider]  TRELEGY ELLIPTA 100-62.5-25 MCG/INH AEPB Inhale 1 puff into the lungs daily. 05/08/19  Yes [provider]  Vitamin D, Ergocalciferol, (DRISDOL) 1.25 MG (50000 UNIT) CAPS capsule Take 50,000 Units by mouth once a week. 04/20/22  Yes [provider]  predniSONE (DELTASONE) 50 MG tablet 1 tablet p.o. 14 hours then 7 hours then 2 hours before the scan.  Please also take Benadryl 25 mg 2 hours before the scan. Patient not taking: Reported on 06/01/2021 10/31/20   Curt Bears, MD  [facility] HFA 108 [phone] Base) MCG/ACT inhaler Inhale 2 puffs into the lungs every 6 (six) hours as needed for wheezing or shortness of breath. Patient not taking: Reported on 07/11/2022 10/02/17   Florencia Reasons, MD                                                                                                                                    Allergies Omnipaque [iohexol]  Review of Systems Review of Systems As noted in HPI  Physical Exam Vital Signs  I have reviewed the triage vital signs BP (!) 141/84   Pulse 87   Temp 98.4 F (36.9 C)    Resp 18   Ht 6' (1.829 m)   Wt 102.5 kg   SpO2 94%   BMI 30.64 kg/m   Physical Exam Vitals reviewed.  Constitutional:      General: He is not in acute distress.    Appearance: He is well-developed. He is not diaphoretic.  HENT:     Head: Normocephalic and atraumatic.     Nose: Nose normal.  Eyes:     General: No scleral icterus.       Right  eye: No discharge.        Left eye: No discharge.     Conjunctiva/sclera: Conjunctivae normal.     Pupils: Pupils are equal, round, and reactive to light.  Cardiovascular:     Rate and Rhythm: Normal rate and regular rhythm.     Heart sounds: No murmur heard.    No friction rub. No gallop.  Pulmonary:     Effort: Pulmonary effort is normal. No respiratory distress.     Breath sounds: Normal breath sounds. No stridor. No rales.  Abdominal:     General: There is no distension.     Palpations: Abdomen is soft.     Tenderness: There is no abdominal tenderness. There is no guarding or rebound.  Musculoskeletal:        General: No tenderness.     Cervical back: Normal range of motion and neck supple.  Skin:    General: Skin is warm and dry.     Findings: No erythema or rash.  Neurological:     Mental Status: He is alert and oriented to person, place, and time.     ED Results and Treatments Labs (all labs ordered are listed, but only abnormal results are displayed) Labs Reviewed  CBC WITH DIFFERENTIAL/PLATELET - Abnormal; Notable for the following components:      Result Value   WBC 21.6 (*)    Neutro Abs 19.1 (*)    Lymphs Abs 0.6 (*)    Monocytes Absolute 1.4 (*)    Abs Immature Granulocytes 0.21 (*)    All other components within normal limits  COMPREHENSIVE METABOLIC PANEL - Abnormal; Notable for the following components:   Potassium 5.3 (*)    Glucose, Bld 115 (*)    Creatinine, Ser 1.48 (*)    GFR, Estimated 50 (*)    All other components within normal limits  URINALYSIS, ROUTINE W REFLEX MICROSCOPIC - Abnormal;  Notable for the following components:   Specific Gravity, Urine >1.030 (*)    Bilirubin Urine SMALL (*)    Ketones, ur 15 (*)    All other components within normal limits  LIPASE, BLOOD                                                                                                                         EKG  EKG Interpretation  Date/Time:    Ventricular Rate:    PR Interval:    QRS Duration:   QT Interval:    QTC Calculation:   R Axis:     Text Interpretation:         Radiology CT ABDOMEN PELVIS WO CONTRAST  Result Date: 07/10/2022 CLINICAL DATA:  Acute generalized abdominal pain. Constipation, vomiting. EXAM: CT ABDOMEN AND PELVIS WITHOUT CONTRAST TECHNIQUE: Multidetector CT imaging of the abdomen and pelvis was performed following the standard protocol without IV contrast. RADIATION DOSE REDUCTION: This exam was performed according to the departmental dose-optimization program which includes automated exposure control, adjustment of the mA  and/or kV according to patient size and/or use of iterative reconstruction technique. COMPARISON:  September 08, 2019. FINDINGS: Lower chest: No acute abnormality. Hepatobiliary: No focal liver abnormality is seen. No gallstones, gallbladder wall thickening, or biliary dilatation. Pancreas: Unremarkable. No pancreatic ductal dilatation or surrounding inflammatory changes. Spleen: Normal in size without focal abnormality. Adrenals/Urinary Tract: Adrenal glands appear normal. Nonobstructive bilateral nephrolithiasis is noted. Stable bilateral renal cysts are noted for which no further follow-up is required. Urinary bladder is unremarkable. Stomach/Bowel: The stomach appears normal. There is no evidence of bowel obstruction or inflammation. Vascular/Lymphatic: Aortic atherosclerosis. No enlarged abdominal or pelvic lymph nodes. Reproductive: Prostate is unremarkable. Other: No abdominal wall hernia or abnormality. No abdominopelvic ascites.  Musculoskeletal: No acute or significant osseous findings. IMPRESSION: Nonobstructive bilateral nephrolithiasis. No acute abnormality seen in the abdomen or pelvis. Aortic Atherosclerosis (ICD10-I70.0). Electronically Signed   By: Marijo Conception M.D.   On: 07/10/2022 18:11    Medications Ordered in ED Medications - No data to display                                                                                                                                   Procedures Procedures  (including critical care time)  Medical Decision Making / ED Course   Medical Decision Making Amount and/or Complexity of Data Reviewed External Data Reviewed: notes.    Details: Oncology note mentioned in HPI Labs: ordered. Decision-making details documented in ED Course. Radiology: ordered and independent interpretation performed. Decision-making details documented in ED Course.  Risk Prescription drug management. Decision regarding hospitalization.    Abdominal discomfort with nausea vomiting. On exam abdomen is benign.  Possible viral gastroenteritis.  We will also need to consider bowel obstruction, other intra-abdominal inflammatory/infectious process, electrolyte derangements, metabolic derangements, biliary disease pancreatitis.  CBC notable for significant leukocytosis up from a few days ago.  No anemia CMP with baseline hypocalcemia.  Mild hyperglycemia without DKA.  Stable renal function.  No evidence of biliary obstruction or pancreatitis. UA without evidence of infection.  Given this significant leukocytosis, CT scan obtained and negative for bowel obstruction or acute inflammatory process.  Patient was provided with oral fluids and tolerated well. Does not require admission.       Final Clinical Impression(s) / ED Diagnoses Final diagnoses:  Nausea vomiting and diarrhea   The patient appears reasonably screened and/or stabilized for discharge and I doubt any other medical  condition or other [facility] requiring further screening, evaluation, or treatment in the ED at this time. I have discussed the findings, Dx and Tx plan with the patient/family who expressed understanding and agree(s) with the plan. Discharge instructions discussed at length. The patient/family was given strict return precautions who verbalized understanding of the instructions. No further questions at time of discharge.  Disposition: Discharge  Condition: Good  ED Discharge Orders          Ordered    ondansetron (  ZOFRAN-ODT) 4 MG disintegrating tablet  Every 8 hours PRN        07/11/22 0332             Follow Up: [name], [place] [address] [phone]  Call  to schedule an appointment for close follow up           This chart was dictated using voice recognition software.  Despite best efforts to proofread,  errors can occur which can change the documentation meaning.    Fatima Blank, MD 07/11/22 [phone]

## 2022-09-04 ENCOUNTER — Other Ambulatory Visit: Payer: Self-pay

## 2022-09-04 ENCOUNTER — Inpatient Hospital Stay: Payer: PPO | Attending: Internal Medicine

## 2022-09-04 ENCOUNTER — Inpatient Hospital Stay: Payer: PPO

## 2022-09-04 DIAGNOSIS — Z85118 Personal history of other malignant neoplasm of bronchus and lung: Secondary | ICD-10-CM | POA: Insufficient documentation

## 2022-09-04 DIAGNOSIS — Z452 Encounter for adjustment and management of vascular access device: Secondary | ICD-10-CM | POA: Diagnosis present

## 2022-09-04 DIAGNOSIS — Z95828 Presence of other vascular implants and grafts: Secondary | ICD-10-CM

## 2022-09-04 MED ORDER — HEPARIN SOD (PORK) LOCK FLUSH 100 UNIT/ML IV SOLN
500.0000 [IU] | Freq: Once | INTRAVENOUS | Status: AC | PRN
  Administered 2022-09-04: 500 [IU] via INTRAVENOUS

## 2022-09-04 MED ORDER — SODIUM CHLORIDE 0.9% FLUSH
10.0000 mL | INTRAVENOUS | Status: DC | PRN
  Administered 2022-09-04: 10 mL via INTRAVENOUS

## 2022-09-09 ENCOUNTER — Inpatient Hospital Stay: Payer: PPO | Admitting: Internal Medicine

## 2022-11-03 DIAGNOSIS — G5621 Lesion of ulnar nerve, right upper limb: Secondary | ICD-10-CM

## 2022-11-03 DIAGNOSIS — G5601 Carpal tunnel syndrome, right upper limb: Secondary | ICD-10-CM | POA: Insufficient documentation

## 2022-11-03 HISTORY — DX: Carpal tunnel syndrome, right upper limb: G56.01

## 2022-11-03 HISTORY — DX: Lesion of ulnar nerve, right upper limb: G56.21

## 2022-11-08 ENCOUNTER — Encounter: Payer: Self-pay | Admitting: Internal Medicine

## 2022-11-12 ENCOUNTER — Other Ambulatory Visit: Payer: Self-pay

## 2022-11-12 ENCOUNTER — Inpatient Hospital Stay: Payer: PPO | Attending: Internal Medicine

## 2022-11-12 DIAGNOSIS — C3411 Malignant neoplasm of upper lobe, right bronchus or lung: Secondary | ICD-10-CM | POA: Diagnosis present

## 2022-11-12 DIAGNOSIS — C8258 Diffuse follicle center lymphoma, lymph nodes of multiple sites: Secondary | ICD-10-CM | POA: Diagnosis not present

## 2022-11-12 DIAGNOSIS — Z95828 Presence of other vascular implants and grafts: Secondary | ICD-10-CM

## 2022-11-12 MED ORDER — HEPARIN SOD (PORK) LOCK FLUSH 100 UNIT/ML IV SOLN
500.0000 [IU] | Freq: Once | INTRAVENOUS | Status: AC | PRN
  Administered 2022-11-12: 500 [IU] via INTRAVENOUS

## 2022-11-12 MED ORDER — SODIUM CHLORIDE 0.9% FLUSH
10.0000 mL | INTRAVENOUS | Status: DC | PRN
  Administered 2022-11-12: 10 mL via INTRAVENOUS

## 2022-12-31 ENCOUNTER — Other Ambulatory Visit: Payer: Self-pay

## 2022-12-31 ENCOUNTER — Inpatient Hospital Stay: Payer: PPO | Attending: Internal Medicine

## 2022-12-31 DIAGNOSIS — Z95828 Presence of other vascular implants and grafts: Secondary | ICD-10-CM

## 2022-12-31 DIAGNOSIS — C3411 Malignant neoplasm of upper lobe, right bronchus or lung: Secondary | ICD-10-CM | POA: Insufficient documentation

## 2022-12-31 DIAGNOSIS — Z452 Encounter for adjustment and management of vascular access device: Secondary | ICD-10-CM | POA: Insufficient documentation

## 2022-12-31 MED ORDER — SODIUM CHLORIDE 0.9% FLUSH
10.0000 mL | INTRAVENOUS | Status: DC | PRN
  Administered 2022-12-31: 10 mL via INTRAVENOUS

## 2022-12-31 MED ORDER — HEPARIN SOD (PORK) LOCK FLUSH 100 UNIT/ML IV SOLN
500.0000 [IU] | Freq: Once | INTRAVENOUS | Status: AC | PRN
  Administered 2022-12-31: 500 [IU] via INTRAVENOUS

## 2023-01-23 ENCOUNTER — Emergency Department (HOSPITAL_COMMUNITY)
Admission: EM | Admit: 2023-01-23 | Discharge: 2023-01-23 | Disposition: A | Discharge status: Home / Self Care | Payer: PPO | Attending: Student | Admitting: Student

## 2023-01-23 ENCOUNTER — Emergency Department (HOSPITAL_COMMUNITY): Payer: PPO

## 2023-01-23 ENCOUNTER — Other Ambulatory Visit: Payer: Self-pay

## 2023-01-23 ENCOUNTER — Encounter (HOSPITAL_COMMUNITY): Payer: Self-pay | Admitting: *Deleted

## 2023-01-23 DIAGNOSIS — I509 Heart failure, unspecified: Secondary | ICD-10-CM | POA: Diagnosis not present

## 2023-01-23 DIAGNOSIS — Z85118 Personal history of other malignant neoplasm of bronchus and lung: Secondary | ICD-10-CM | POA: Insufficient documentation

## 2023-01-23 DIAGNOSIS — I4891 Unspecified atrial fibrillation: Secondary | ICD-10-CM

## 2023-01-23 DIAGNOSIS — J441 Chronic obstructive pulmonary disease with (acute) exacerbation: Secondary | ICD-10-CM | POA: Insufficient documentation

## 2023-01-23 DIAGNOSIS — Z20822 Contact with and (suspected) exposure to covid-19: Secondary | ICD-10-CM | POA: Diagnosis not present

## 2023-01-23 DIAGNOSIS — Z7951 Long term (current) use of inhaled steroids: Secondary | ICD-10-CM | POA: Diagnosis not present

## 2023-01-23 DIAGNOSIS — Z7901 Long term (current) use of anticoagulants: Secondary | ICD-10-CM | POA: Diagnosis not present

## 2023-01-23 DIAGNOSIS — R0602 Shortness of breath: Secondary | ICD-10-CM | POA: Diagnosis present

## 2023-01-23 DIAGNOSIS — R0609 Other forms of dyspnea: Secondary | ICD-10-CM

## 2023-01-23 LAB — BASIC METABOLIC PANEL
Anion gap: 9 (ref 5–15)
BUN: 16 mg/dL (ref 8–23)
CO2: 27 mmol/L (ref 22–32)
Calcium: 9.2 mg/dL (ref 8.9–10.3)
Chloride: 95 mmol/L — ABNORMAL LOW (ref 98–111)
Creatinine, Ser: 1.26 mg/dL — ABNORMAL HIGH (ref 0.61–1.24)
GFR, Estimated: >60 mL/min (ref >60)
Glucose, Bld: 111 mg/dL — ABNORMAL HIGH (ref 70–99)
Potassium: 4.7 mmol/L (ref 3.5–5.1)
Sodium: 131 mmol/L — ABNORMAL LOW (ref 135–145)

## 2023-01-23 LAB — TROPONIN I (HIGH SENSITIVITY)
Troponin I (High Sensitivity): 6 ng/L (ref <18)
Troponin I (High Sensitivity): 5 ng/L (ref <18)

## 2023-01-23 LAB — CBC
HCT: 43.5 % (ref 39.0–52.0)
Hemoglobin: 13.8 g/dL (ref 13.0–17.0)
MCH: 29 pg (ref 26.0–34.0)
MCHC: 31.7 g/dL (ref 30.0–36.0)
MCV: 91.4 fL (ref 80.0–100.0)
Platelets: 238 10*3/uL (ref 150–400)
RBC: 4.76 MIL/uL (ref 4.22–5.81)
RDW: 13.6 % (ref 11.5–15.5)
WBC: 7.4 10*3/uL (ref 4.0–10.5)
nRBC: 0 % (ref 0.0–0.2)

## 2023-01-23 LAB — MAGNESIUM: Magnesium: 1.7 mg/dL (ref 1.7–2.4)

## 2023-01-23 LAB — TSH: TSH: 1.566 u[IU]/mL (ref 0.350–4.500)

## 2023-01-23 LAB — PROTIME-INR
INR: 1.1 (ref 0.8–1.2)
Prothrombin Time: 13.8 seconds (ref 11.4–15.2)

## 2023-01-23 LAB — BRAIN NATRIURETIC PEPTIDE: B Natriuretic Peptide: 169.8 pg/mL — ABNORMAL HIGH (ref 0.0–100.0)

## 2023-01-23 LAB — SARS CORONAVIRUS 2 BY RT PCR: SARS Coronavirus 2 by RT PCR: NEGATIVE

## 2023-01-23 LAB — APTT: aPTT: 29 seconds (ref 24–36)

## 2023-01-23 MED ORDER — IOHEXOL 350 MG/ML SOLN
75.0000 mL | Freq: Once | INTRAVENOUS | Status: AC | PRN
  Administered 2023-01-23: 75 mL via INTRAVENOUS

## 2023-01-23 MED ORDER — METHYLPREDNISOLONE SODIUM SUCC 40 MG IJ SOLR: 40.0000 mg | Freq: Once | INTRAMUSCULAR | Status: DC

## 2023-01-23 MED ORDER — APIXABAN 5 MG PO TABS: 5.0000 mg | ORAL_TABLET | Freq: Two times a day (BID) | ORAL | 0 refills | Status: DC

## 2023-01-23 MED ORDER — APIXABAN 5 MG PO TABS
5.0000 mg | ORAL_TABLET | Freq: Once | ORAL | Status: AC
  Administered 2023-01-23: 5 mg via ORAL
  Filled 2023-01-23: qty 1

## 2023-01-23 MED ORDER — DIPHENHYDRAMINE HCL 50 MG/ML IJ SOLN: 50.0000 mg | Freq: Once | INTRAMUSCULAR | Status: DC

## 2023-01-23 MED ORDER — DILTIAZEM HCL ER 60 MG PO CP12: 60.0000 mg | ORAL_CAPSULE | Freq: Two times a day (BID) | ORAL | 0 refills | Status: DC

## 2023-01-23 MED ORDER — DILTIAZEM HCL 30 MG PO TABS: 60.0000 mg | ORAL_TABLET | Freq: Once | ORAL | Status: DC

## 2023-01-23 MED ORDER — IPRATROPIUM-ALBUTEROL 0.5-2.5 (3) MG/3ML IN SOLN
3.0000 mL | Freq: Once | RESPIRATORY_TRACT | Status: AC
  Administered 2023-01-23: 3 mL via RESPIRATORY_TRACT
  Filled 2023-01-23: qty 3

## 2023-01-23 MED ORDER — ALBUTEROL SULFATE HFA 108 (90 BASE) MCG/ACT IN AERS
2.0000 | INHALATION_SPRAY | RESPIRATORY_TRACT | Status: DC | PRN
  Filled 2023-01-23 (×2): qty 6.7

## 2023-01-23 MED ORDER — PREDNISONE 10 MG (21) PO TBPK: ORAL_TABLET | Freq: Every day | ORAL | 0 refills | Status: AC

## 2023-01-23 MED ORDER — METHYLPREDNISOLONE SODIUM SUCC 125 MG IJ SOLR
125.0000 mg | Freq: Once | INTRAMUSCULAR | Status: AC
  Administered 2023-01-23: 125 mg via INTRAVENOUS
  Filled 2023-01-23: qty 2

## 2023-01-23 MED ORDER — DIPHENHYDRAMINE HCL 50 MG/ML IJ SOLN
50.0000 mg | Freq: Once | INTRAMUSCULAR | Status: AC
  Administered 2023-01-23: 50 mg via INTRAVENOUS
  Filled 2023-01-23: qty 1

## 2023-01-23 MED ORDER — DILTIAZEM HCL ER 60 MG PO CP12
60.0000 mg | ORAL_CAPSULE | Freq: Two times a day (BID) | ORAL | Status: DC
  Administered 2023-01-23: 60 mg via ORAL
  Filled 2023-01-23: qty 1

## 2023-01-23 MED ORDER — DILTIAZEM HCL 60 MG PO TABS: 60.0000 mg | ORAL_TABLET | Freq: Two times a day (BID) | ORAL | 0 refills | Status: DC

## 2023-01-23 NOTE — Discharge Instructions (Addendum)
Thank you for letting us take care of you today.  Your breathing significantly improved with the medications given in the ED.  You are still in atrial fibrillation but this is rate controlled.  A-fib does place you at increased risk of developing clots and strokes so we will place you on a blood thinner to help prevent this.  We will also place you on a medication called Cardizem to help control how fast your heart beats.  You should start both of these medications tomorrow.  I suspect your symptoms are caused by a COPD flare.  We gave you 2 breathing treatments and 2 doses of steroids to help treat this with good relief.  I am prescribing steroids for home as well.  Continue to use your outpatient inhalers as prescribed and start the steroids tomorrow.  As we discussed, please follow-up with your PCP within 1 week for repeat chest x-ray to ensure you do not have a pneumonia.  Follow-up with your oncologist within 1 week to discuss need for PET scan.  I referred you to cardiology for further treatment of your atrial fibrillation.  They should call you within 72 hours to set up a follow-up appointment.  If you do not hear from them, please call their office to set up this appointment.  Discuss with your PCP/cardiologist if you should continue the Cardizem and blood thinner.  Fill the Cardizem SR formulation when you go to pharmacy.  For any new or worsening symptoms, please return to the nearest emergency department for reevaluation.   Information on my medicine - ELIQUIS (apixaban)  Why was Eliquis prescribed for you? Eliquis was prescribed for you to reduce the risk of a blood clot forming that can cause a stroke if you have a medical condition called atrial fibrillation (a type of irregular heartbeat).  What do You need to know about Eliquis ? Take your Eliquis TWICE DAILY - one tablet in the morning and one tablet in the evening with or without food. If you have difficulty swallowing the  tablet whole please discuss with your pharmacist how to take the medication safely.  Take Eliquis exactly as prescribed by your doctor and DO NOT stop taking Eliquis without talking to the doctor who prescribed the medication.  Stopping may increase your risk of developing a stroke.  Refill your prescription before you run out.  After discharge, you should have regular check-up appointments with your healthcare provider that is prescribing your Eliquis.  In the future your dose may need to be changed if your kidney function or weight changes by a significant amount or as you get older.  What do you do if you miss a dose? If you miss a dose, take it as soon as you remember on the same day and resume taking twice daily.  Do not take more than one dose of ELIQUIS at the same time to make up a missed dose.  Important Safety Information A possible side effect of Eliquis is bleeding. You should call your healthcare provider right away if you experience any of the following: Bleeding from an injury or your nose that does not stop. Unusual colored urine (red or dark brown) or unusual colored stools (red or black). Unusual bruising for unknown reasons. A serious fall or if you hit your head (even if there is no bleeding).  Some medicines may interact with Eliquis and might increase your risk of bleeding or clotting while on Eliquis. To help avoid this, consult your healthcare  provider or pharmacist prior to using any new prescription or non-prescription medications, including herbals, vitamins, non-steroidal anti-inflammatory drugs (NSAIDs) and supplements.  This website has more information on Eliquis (apixaban): http://www.eliquis.com/eliquis/home

## 2023-01-23 NOTE — ED Provider Triage Note (Addendum)
Did not evaluate patient in triage as he will be direct bedded.  Patient appears stable and in no acute distress.   Tonette Lederer, PA-C 01/23/23 1240    GowensLawrence Marseilles, PA-C 01/23/23 1241

## 2023-01-23 NOTE — ED Triage Notes (Signed)
Pt went to UC to get something for a ? Sinus infection. Was noted to be in A-Fib and sent here. SHOB noted in triage

## 2023-01-23 NOTE — ED Notes (Signed)
Pt resting in bed with no acute distress noted at this time.  

## 2023-01-23 NOTE — ED Provider Notes (Signed)
[place] [facility] AT [facility] Provider Note   CSN: [id number] Arrival date & time: 01/23/23  1215     History {Add pertinent medical, surgical, social history, OB history to HPI:1} Chief Complaint  Patient presents with   Shortness of Breath   Atrial Fibrillation    [name] is a 72 y.o. male with PMH lung cancer post resection, thyroid disease, pulmonary HTN, COPD, heart failure who presents to ED from urgent care for further evaluation.  Patient states that he went to urgent care as he has been having cough, congestion, and some increased shortness of breath.  He is on Trelegy inhaler daily and uses an albuterol inhaler as needed.  He states that he typically has COPD exacerbations every few months.  He denies associated chest pain, lower extremity pain or swelling, nausea, vomiting, diarrhea, palpitations, lightheadedness, dizziness, syncope, abdominal pain, focal weakness, headache, vision changes, or other concerns today.  He states that he thought he had a sinus infection and urgent care completed an EKG which showed he was in atrial fibrillation.  Remote history of A-fib with prior cardioversion by cardiology.  He is not currently on any medications for rate control or any anticoagulants.  He states his lung cancer was treated with the resection and he did not require radiation or chemotherapy.  No current treatment for cancer and he is in remission per patient. No known sick contacts.       Home Medications Prior to Admission medications   Medication Sig Start Date End Date Taking? Authorizing Provider  apixaban (ELIQUIS) 5 MG TABS tablet Take 1 tablet (5 mg total) by mouth 2 (two) times daily. 01/23/23 02/22/23 Yes Thorin Starner L, PA-C  diltiazem (CARDIZEM) 60 MG tablet Take 1 tablet (60 mg total) by mouth 2 (two) times daily. 01/23/23 02/22/23 Yes Dyami Umbach L, PA-C  predniSONE (STERAPRED UNI-PAK 21 TAB) 10 MG (21) TBPK tablet Take by mouth daily for  12 days. Take 6 tabs by mouth daily  for 2 days, then 5 tabs for 2 days, then 4 tabs for 2 days, then 3 tabs for 2 days, 2 tabs for 2 days, then 1 tab by mouth daily for 2 days 01/23/23 02/04/23 Yes Kong Packett L, PA-C  albuterol (PROVENTIL) (2.5 MG/3ML) 0.083% nebulizer solution Take 3 mLs (2.5 mg total) by nebulization every 4 (four) hours as needed for wheezing or shortness of breath. 10/02/17   Albertine Grates, MD  allopurinol (ZYLOPRIM) 300 MG tablet Take 300 mg by mouth daily. 01/15/17   [provider]  amLODipine (NORVASC) 5 MG tablet Take 5 mg by mouth daily. 01/21/20   [provider]  Buprenorphine HCl-Naloxone HCl 8-2 MG FILM Place under the tongue 2 (two) times daily. 06/08/22   [provider]  cetirizine (ZYRTEC) 10 MG tablet Take 10 mg by mouth daily. 06/07/22   [provider]  finasteride (PROSCAR) 5 MG tablet Take 5 mg by mouth daily. 03/13/19   [provider]  levothyroxine (SYNTHROID, LEVOTHROID) 150 MCG tablet Take 150 mcg by mouth daily. 11/07/15   [provider]  lisinopril (PRINIVIL,ZESTRIL) 10 MG tablet Take 1 tablet (10 mg total) by mouth daily. 12/29/11   Nahser, Deloris Ping, MD  omeprazole (PRILOSEC) 20 MG capsule Take 1 capsule (20 mg total) by mouth 2 (two) times daily. 10/13/19   Leslye Peer, MD  PROAIR HFA 108 [phone] Base) MCG/ACT inhaler Inhale 2 puffs into the lungs every 6 (six) hours as  needed for wheezing or shortness of breath. Patient not taking: Reported on 07/11/2022 10/02/17   Albertine Grates, MD  sodium chloride (OCEAN) 0.65 % SOLN nasal spray Place 1 spray into both nostrils daily as needed for congestion.    [provider]  TRELEGY ELLIPTA 100-62.5-25 MCG/INH AEPB Inhale 1 puff into the lungs daily. 05/08/19   [provider]  Vitamin D, Ergocalciferol, (DRISDOL) 1.25 MG (50000 UNIT) CAPS capsule Take 50,000 Units by mouth once a week. 04/20/22   [provider]      Allergies    Omnipaque [iohexol]     Review of Systems   Review of Systems  All other systems reviewed and are negative.   Physical Exam Updated Vital Signs BP (!) 145/87 (BP Location: Left Arm)   Pulse 96   Temp 98.5 F (36.9 C) (Oral)   Resp 16   Ht 6' (1.829 m)   Wt 90.7 kg   SpO2 96%   BMI 27.12 kg/m  Physical Exam Vitals and nursing note reviewed.  Constitutional:      General: He is not in acute distress.    Appearance: Normal appearance. He is not ill-appearing, toxic-appearing or diaphoretic.  HENT:     Head: Normocephalic and atraumatic.     Mouth/Throat:     Mouth: Mucous membranes are moist.     Pharynx: Oropharynx is clear. No pharyngeal swelling or oropharyngeal exudate.  Eyes:     Extraocular Movements: Extraocular movements intact.     Conjunctiva/sclera: Conjunctivae normal.  Neck:     Vascular: No JVD.  Cardiovascular:     Rate and Rhythm: Normal rate and regular rhythm.     Heart sounds: No murmur heard. Pulmonary:     Effort: Pulmonary effort is normal.     Breath sounds: Decreased breath sounds and wheezing (moderate diffusely worse on expiration) present. No rhonchi or rales.  Chest:     Chest wall: No mass, deformity, tenderness, crepitus or edema.  Abdominal:     General: Abdomen is flat.     Palpations: Abdomen is soft. There is no mass.     Tenderness: There is no abdominal tenderness. There is no guarding or rebound.  Musculoskeletal:        General: Normal range of motion.     Cervical back: Normal range of motion and neck supple.     Right lower leg: No tenderness. No edema.     Left lower leg: No tenderness. No edema.  Skin:    General: Skin is warm and dry.     Capillary Refill: Capillary refill takes less than 2 seconds.     Coloration: Skin is not pale.  Neurological:     General: No focal deficit present.     Mental Status: He is alert and oriented to person, place, and time. Mental status is at baseline.  Psychiatric:        Mood and Affect: Mood normal.         Behavior: Behavior normal.     ED Results / Procedures / Treatments   Labs (all labs ordered are listed, but only abnormal results are displayed) Labs Reviewed  BASIC METABOLIC PANEL - Abnormal; Notable for the following components:      Result Value   Sodium 131 (*)    Chloride 95 (*)    Glucose, Bld 111 (*)    Creatinine, Ser 1.26 (*)    All other components within normal limits  BRAIN NATRIURETIC PEPTIDE - Abnormal;  Notable for the following components:   B Natriuretic Peptide 169.8 (*)    All other components within normal limits  SARS CORONAVIRUS 2 BY RT PCR  CBC  PROTIME-INR  APTT  TSH  MAGNESIUM  TROPONIN I (HIGH SENSITIVITY)  TROPONIN I (HIGH SENSITIVITY)    EKG EKG Interpretation  Date/Time:  Saturday Jan 23 2023 12:26:40 EDT Ventricular Rate:  103 PR Interval:    QRS Duration: 95 QT Interval:  330 QTC Calculation: 434 R Axis:   84 Text Interpretation: Atrial fibrillation Borderline right axis deviation Borderline low voltage, extremity leads Confirmed by Ernie Avena ([id number]) on 01/23/2023 5:24:37 PM  Radiology CT Chest W Contrast  Result Date: 01/23/2023 CLINICAL DATA:  Pleural effusion; * Tracking Code: BO * EXAM: CT CHEST WITH CONTRAST TECHNIQUE: Multidetector CT imaging of the chest was performed during intravenous contrast administration. RADIATION DOSE REDUCTION: This exam was performed according to the departmental dose-optimization program which includes automated exposure control, adjustment of the mA and/or kV according to patient size and/or use of iterative reconstruction technique. CONTRAST:  75mL OMNIPAQUE IOHEXOL 350 MG/ML SOLN COMPARISON:  Chest CT dated July 07, 2022 FINDINGS: Cardiovascular: Heart size. Pericardial effusion. Normal caliber thoracic aorta with moderate atherosclerotic disease. Dilated main pulmonary artery, measuring up to 3.9 cm. Moderate coronary artery calcifications. Mediastinum/Nodes: Esophagus is unremarkable. Mildly  enlarged pre-vascular lymph node measuring 10 mm on series 2, image 49, unchanged when compared with the prior exam. No enlarging lymph nodes seen in the chest. Lungs/Pleura: Right upper lobe postradiation change increased right apical consolidation. Mild debris seen in the right mainstem bronchus. Mild bilateral cylindrical bronchiectasis. Innumerable centrilobular nodules are seen in the bilateral lower lobes with more confluent areas of consolidation seen in the left lower lobe. No pleural effusion or pneumothorax. Right pleural calcifications, likely sequela of prior pneumothorax or empyema. Upper Abdomen: Numerous bilateral low-attenuation renal lesions, largest are compatible with simple cysts, others are too small to accurately characterize, no specific follow-up imaging is recommended. Unchanged solid lesion of the anterior spleen measuring 2.8 cm on series 2, image 179, non FDG avid on prior PET-CT and likely benign. Musculoskeletal: No chest wall abnormality. No acute or significant osseous findings. IMPRESSION: 1. Right upper lobe postradiation change with increased adjacent right apical consolidation, likely due to atelectasis. Recommend PET-CT to assess for recurrent disease. 2. No evidence of metastatic disease in the chest. 3. Mild debris of the right mainstem bronchus and new innumerable centrilobular of the bilateral lower lobes with more confluent areas of consolidation seen in the left lower lobe, findings are likely due to aspiration. Recommend attention on follow-up. 4. Dilated main pulmonary artery, can be seen in the setting of pulmonary hypertension. 5. Coronary artery calcifications and aortic Atherosclerosis (ICD10-I70.0). Electronically Signed   By: Allegra Lai M.D.   On: 01/23/2023 20:10   DG Chest 2 View  Result Date: 01/23/2023 CLINICAL DATA:  Insert his EXAM: CHEST - 2 VIEW COMPARISON:  October 13, 2019 October seventeenth 2023 FINDINGS: The cardiomediastinal silhouette is  unchanged in contour.RIGHT chest port with tip terminating over the superior cavoatrial junction. There is increased confluent opacification of the RIGHT apex in comparison to October 2023 CT scan. No LEFT-sided pleural effusion. Calcified pleural plaques. Visualized abdomen is unremarkable. Multilevel degenerative changes of the thoracic spine. IMPRESSION: Increased confluent opacification of the RIGHT apex in comparison to October 2023 CT scan. Differential is broad and includes infection, atelectasis, progression of known malignancy or increased pleural thickening/loculated pleural effusion. This would  be better assessed with dedicated contrast enhanced CT chest. Electronically Signed   By: Meda Klinefelter M.D.   On: 01/23/2023 13:11    Procedures Procedures  {Document cardiac monitor, telemetry assessment procedure when appropriate:1}  Medications Ordered in ED Medications  albuterol (VENTOLIN HFA) 108 (90 Base) MCG/ACT inhaler 2 puff (has no administration in time range)  ipratropium-albuterol (DUONEB) 0.5-2.5 (3) MG/3ML nebulizer solution 3 mL (has no administration in time range)  methylPREDNISolone sodium succinate (SOLU-MEDROL) 125 mg/2 mL injection 125 mg (has no administration in time range)  apixaban (ELIQUIS) tablet 5 mg (has no administration in time range)  diltiazem (CARDIZEM) tablet 60 mg (has no administration in time range)  methylPREDNISolone sodium succinate (SOLU-MEDROL) 125 mg/2 mL injection 125 mg (125 mg Intravenous Given 01/23/23 1337)  ipratropium-albuterol (DUONEB) 0.5-2.5 (3) MG/3ML nebulizer solution 3 mL (3 mLs Nebulization Given 01/23/23 1334)  diphenhydrAMINE (BENADRYL) injection 50 mg (50 mg Intravenous Given 01/23/23 1417)  iohexol (OMNIPAQUE) 350 MG/ML injection 75 mL (75 mLs Intravenous Contrast Given 01/23/23 1445)    ED Course/ Medical Decision Making/ A&P   {   Click here for ABCD2, HEART and other calculatorsREFRESH Note before signing :1}                           Medical Decision Making Amount and/or Complexity of Data Reviewed Labs: ordered. Decision-making details documented in ED Course. Radiology: ordered. Decision-making details documented in ED Course. ECG/medicine tests: ordered. Decision-making details documented in ED Course.  Risk Prescription drug management.   Medical Decision Making:   KIRKLAN BUENO is a 72 y.o. male who presented to the ED today with dyspnea detailed above.    Additional history discussed with patient's family/caregivers.  Patient's presentation is complicated by their history of COPD, hypertension, hyperlipidemia, CHF, lung cancer.  Patient placed on continuous vitals and telemetry monitoring while in ED which was reviewed periodically.  Complete initial physical exam performed, notably the patient was in no acute distress.  He did have diminished breath sounds throughout with moderate expiratory wheezing.  No rales, rhonchi, or respiratory distress.  Mild tachycardia with irregular rhythm.    Reviewed and confirmed nursing documentation for past medical history, family history, social history.    Initial Assessment:   With the patient's presentation, differential diagnosis includes but is not limited to  Cardiac (AHF, pericardial effusion and tamponade, arrhythmias, ischemia, etc) Respiratory (COPD, asthma, pneumonia, pneumothorax, primary pulmonary hypertension, PE/VQ mismatch) Hematological (anemia) Neuromuscular (ALS, Guillain-Barr, etc)  This is most consistent with an acute complicated illness  Initial Plan:  Screening labs including CBC and Metabolic panel to evaluate for infectious or metabolic etiology of disease.  CXR to evaluate for structural/infectious intrathoracic pathology.  EKG to evaluate for cardiac pathology Troponin to evaluate for cardiac etiology BNP to evaluate for fluid overload Blood gas to assess acid-base status Symptomatic treatment Objective evaluation as below reviewed    Initial Study Results:   Laboratory  All laboratory results reviewed without evidence of clinically relevant pathology.   Exceptions include: ***   EKG EKG was reviewed independently. Rate, rhythm, axis, intervals all examined and without medically relevant abnormality. ST segments without concerns for elevations.    Radiology:  All images reviewed independently. Agree with radiology report at this time.   CT Chest W Contrast  Result Date: 01/23/2023 CLINICAL DATA:  Pleural effusion; * Tracking Code: BO * EXAM: CT CHEST WITH CONTRAST TECHNIQUE: Multidetector CT imaging of the chest  was performed during intravenous contrast administration. RADIATION DOSE REDUCTION: This exam was performed according to the departmental dose-optimization program which includes automated exposure control, adjustment of the mA and/or kV according to patient size and/or use of iterative reconstruction technique. CONTRAST:  75mL OMNIPAQUE IOHEXOL 350 MG/ML SOLN COMPARISON:  Chest CT dated July 07, 2022 FINDINGS: Cardiovascular: Heart size. Pericardial effusion. Normal caliber thoracic aorta with moderate atherosclerotic disease. Dilated main pulmonary artery, measuring up to 3.9 cm. Moderate coronary artery calcifications. Mediastinum/Nodes: Esophagus is unremarkable. Mildly enlarged pre-vascular lymph node measuring 10 mm on series 2, image 49, unchanged when compared with the prior exam. No enlarging lymph nodes seen in the chest. Lungs/Pleura: Right upper lobe postradiation change increased right apical consolidation. Mild debris seen in the right mainstem bronchus. Mild bilateral cylindrical bronchiectasis. Innumerable centrilobular nodules are seen in the bilateral lower lobes with more confluent areas of consolidation seen in the left lower lobe. No pleural effusion or pneumothorax. Right pleural calcifications, likely sequela of prior pneumothorax or empyema. Upper Abdomen: Numerous bilateral low-attenuation renal  lesions, largest are compatible with simple cysts, others are too small to accurately characterize, no specific follow-up imaging is recommended. Unchanged solid lesion of the anterior spleen measuring 2.8 cm on series 2, image 179, non FDG avid on prior PET-CT and likely benign. Musculoskeletal: No chest wall abnormality. No acute or significant osseous findings. IMPRESSION: 1. Right upper lobe postradiation change with increased adjacent right apical consolidation, likely due to atelectasis. Recommend PET-CT to assess for recurrent disease. 2. No evidence of metastatic disease in the chest. 3. Mild debris of the right mainstem bronchus and new innumerable centrilobular of the bilateral lower lobes with more confluent areas of consolidation seen in the left lower lobe, findings are likely due to aspiration. Recommend attention on follow-up. 4. Dilated main pulmonary artery, can be seen in the setting of pulmonary hypertension. 5. Coronary artery calcifications and aortic Atherosclerosis (ICD10-I70.0). Electronically Signed   By: Allegra Lai M.D.   On: 01/23/2023 20:10   DG Chest 2 View  Result Date: 01/23/2023 CLINICAL DATA:  Insert his EXAM: CHEST - 2 VIEW COMPARISON:  October 13, 2019 October seventeenth 2023 FINDINGS: The cardiomediastinal silhouette is unchanged in contour.RIGHT chest port with tip terminating over the superior cavoatrial junction. There is increased confluent opacification of the RIGHT apex in comparison to October 2023 CT scan. No LEFT-sided pleural effusion. Calcified pleural plaques. Visualized abdomen is unremarkable. Multilevel degenerative changes of the thoracic spine. IMPRESSION: Increased confluent opacification of the RIGHT apex in comparison to October 2023 CT scan. Differential is broad and includes infection, atelectasis, progression of known malignancy or increased pleural thickening/loculated pleural effusion. This would be better assessed with dedicated contrast  enhanced CT chest. Electronically Signed   By: Meda Klinefelter M.D.   On: 01/23/2023 13:11      Consults: Case discussed with ***.   Final Assessment and Plan:   ***    Clinical Impression:  1. COPD exacerbation (HCC)   2. Atrial fibrillation, unspecified type (HCC)   3. Dyspnea on exertion      Discharge     {Document critical care time when appropriate:1} {Document review of labs and clinical decision tools ie heart score, Chads2Vasc2 etc:1}  {Document your independent review of radiology images, and any outside records:1} {Document your discussion with family members, caretakers, and with consultants:1} {Document social determinants of health affecting pt's care:1} {Document your decision making why or why not admission, treatments were needed:1} Final Clinical Impression(s) /  ED Diagnoses Final diagnoses:  COPD exacerbation (HCC)  Atrial fibrillation, unspecified type (HCC)  Dyspnea on exertion    Rx / DC Orders ED Discharge Orders          Ordered    diltiazem (CARDIZEM) 60 MG tablet  2 times daily        01/23/23 2101    apixaban (ELIQUIS) 5 MG TABS tablet  2 times daily        01/23/23 2101    predniSONE (STERAPRED UNI-PAK 21 TAB) 10 MG (21) TBPK tablet  Daily        01/23/23 2101    Ambulatory referral to Cardiology       Comments: If you have not heard from the Cardiology office within the next 72 hours please call [phone].   01/23/23 2102

## 2023-01-24 ENCOUNTER — Encounter: Payer: Self-pay | Admitting: Internal Medicine

## 2023-01-25 ENCOUNTER — Other Ambulatory Visit: Payer: Self-pay | Admitting: Internal Medicine

## 2023-01-25 DIAGNOSIS — C3490 Malignant neoplasm of unspecified part of unspecified bronchus or lung: Secondary | ICD-10-CM

## 2023-01-26 ENCOUNTER — Other Ambulatory Visit: Payer: Self-pay

## 2023-01-26 ENCOUNTER — Ambulatory Visit: Payer: PPO | Attending: Cardiology | Admitting: Cardiology

## 2023-01-26 ENCOUNTER — Telehealth: Payer: Self-pay | Admitting: Physician Assistant

## 2023-01-26 VITALS — BP 136/62 | HR 79 | Ht 72.0 in | Wt 208.0 lb

## 2023-01-26 DIAGNOSIS — D751 Secondary polycythemia: Secondary | ICD-10-CM

## 2023-01-26 DIAGNOSIS — I700 Atherosclerosis of aorta: Secondary | ICD-10-CM

## 2023-01-26 DIAGNOSIS — J431 Panlobular emphysema: Secondary | ICD-10-CM

## 2023-01-26 DIAGNOSIS — M1990 Unspecified osteoarthritis, unspecified site: Secondary | ICD-10-CM | POA: Insufficient documentation

## 2023-01-26 DIAGNOSIS — K56609 Unspecified intestinal obstruction, unspecified as to partial versus complete obstruction: Secondary | ICD-10-CM | POA: Insufficient documentation

## 2023-01-26 DIAGNOSIS — I2510 Atherosclerotic heart disease of native coronary artery without angina pectoris: Secondary | ICD-10-CM

## 2023-01-26 DIAGNOSIS — I10 Essential (primary) hypertension: Secondary | ICD-10-CM | POA: Diagnosis not present

## 2023-01-26 DIAGNOSIS — I4891 Unspecified atrial fibrillation: Secondary | ICD-10-CM | POA: Diagnosis not present

## 2023-01-26 DIAGNOSIS — I272 Pulmonary hypertension, unspecified: Secondary | ICD-10-CM | POA: Insufficient documentation

## 2023-01-26 DIAGNOSIS — C3411 Malignant neoplasm of upper lobe, right bronchus or lung: Secondary | ICD-10-CM

## 2023-01-26 DIAGNOSIS — I509 Heart failure, unspecified: Secondary | ICD-10-CM | POA: Insufficient documentation

## 2023-01-26 DIAGNOSIS — E669 Obesity, unspecified: Secondary | ICD-10-CM | POA: Insufficient documentation

## 2023-01-26 DIAGNOSIS — Z9889 Other specified postprocedural states: Secondary | ICD-10-CM | POA: Insufficient documentation

## 2023-01-26 DIAGNOSIS — K219 Gastro-esophageal reflux disease without esophagitis: Secondary | ICD-10-CM | POA: Insufficient documentation

## 2023-01-26 DIAGNOSIS — I2584 Coronary atherosclerosis due to calcified coronary lesion: Secondary | ICD-10-CM

## 2023-01-26 HISTORY — DX: Atherosclerotic heart disease of native coronary artery without angina pectoris: I25.10

## 2023-01-26 HISTORY — DX: Secondary polycythemia: D75.1

## 2023-01-26 HISTORY — DX: Atherosclerosis of aorta: I70.0

## 2023-01-26 NOTE — Patient Instructions (Signed)
Medication Instructions:  Your physician recommends that you continue on your current medications as directed. Please refer to the Current Medication list given to you today.  *If you need a refill on your cardiac medications before your next appointment, please call your pharmacy*   Lab Work: None ordered If you have labs (blood work) drawn today and your tests are completely normal, you will receive your results only by: MyChart Message (if you have MyChart) OR A paper copy in the mail If you have any lab test that is abnormal or we need to change your treatment, we will call you to review the results.   Testing/Procedures: You are scheduled for a Myocardial Perfusion Imaging Study.  Please arrive 15 minutes prior to your appointment time for registration and insurance purposes.  The test will take approximately 3 to 4 hours to complete; you may bring reading material.  If someone comes with you to your appointment, they will need to remain in the main lobby due to limited space in the testing area.   How to prepare for your Myocardial Perfusion Test: Do not eat or drink 3 hours prior to your test, except you may have water. Do not consume products containing caffeine (regular or decaffeinated) 12 hours prior to your test. (ex: coffee, chocolate, sodas, tea). Do bring a list of your current medications with you.  If not listed below, you may take your medications as normal. Do wear comfortable clothes (no dresses or overalls) and walking shoes, tennis shoes preferred (No heels or open toe shoes are allowed). Do NOT wear cologne, perfume, aftershave, or lotions (deodorant is allowed). If these instructions are not followed, your test will have to be rescheduled.  If you cannot keep your appointment, please provide 24 hours notification to the Nuclear Lab, to avoid a possible $50 charge to your account.   Your physician has requested that you have an echocardiogram. Echocardiography is  a painless test that uses sound waves to create images of your heart. It provides your doctor with information about the size and shape of your heart and how well your heart's chambers and valves are working. This procedure takes approximately one hour. There are no restrictions for this procedure. Please do NOT wear cologne, perfume, aftershave, or lotions (deodorant is allowed). Please arrive 15 minutes prior to your appointment time.   Follow-Up: At [facility], you and your health needs are our priority.  As part of our continuing mission to provide you with exceptional heart care, we have created designated Provider Care Teams.  These Care Teams include your primary Cardiologist (physician) and Advanced Practice Providers (APPs -  Physician Assistants and Nurse Practitioners) who all work together to provide you with the care you need, when you need it.  We recommend signing up for the patient portal called "MyChart".  Sign up information is provided on this After Visit Summary.  MyChart is used to connect with patients for Virtual Visits (Telemedicine).  Patients are able to view lab/test results, encounter notes, upcoming appointments, etc.  Non-urgent messages can be sent to your provider as well.   To learn more about what you can do with MyChart, go to [email or web address].    Your next appointment:   9 month(s)  Provider:   Rajan Revankar, MD   Other Instructions  Cardiac Nuclear Scan A cardiac nuclear scan is a test that is done to check the flow of blood to your heart. It is done when you are resting and   when you are exercising. The test looks for problems such as: Not enough blood reaching a portion of the heart. The heart muscle not working as it should. You may need this test if you have: Heart disease. Lab results that are not normal. Had heart surgery or a balloon procedure to open up blocked arteries (angioplasty) or a small mesh tube (stent). Chest  pain. Shortness of breath. Had a heart attack. In this test, a special dye (tracer) is put into your bloodstream. The tracer will travel to your heart. A camera will then take pictures of your heart to see how the tracer moves through your heart. This test is usually done at a hospital and takes 2-4 hours. Tell a doctor about: Any allergies you have. All medicines you are taking, including vitamins, herbs, eye drops, creams, and over-the-counter medicines. Any bleeding problems you have. Any surgeries you have had. Any medical conditions you have. Whether you are pregnant or may be pregnant. Any history of asthma or long-term (chronic) lung disease. Any history of heart rhythm disorders or heart valve conditions. What are the risks? Your doctor will talk with you about risks. These may include: Serious chest pain and heart attack. This is only a risk if the stress portion of the test is done. Fast or uneven heartbeats (palpitations). A feeling of warmth in your chest. This feeling usually does not last long. Allergic reaction to the tracer. Shortness of breath or trouble breathing. What happens before the test? Ask your doctor about changing or stopping your normal medicines. Follow instructions from your doctor about what you cannot eat or drink. Remove your jewelry on the day of the test. Ask your doctor if you need to avoid nicotine or caffeine. What happens during the test? An IV tube will be inserted into one of your veins. Your doctor will give you a small amount of tracer through the IV tube. You will wait for 20-40 minutes while the tracer moves through your bloodstream. Your heart will be monitored with an electrocardiogram (ECG). You will lie down on an exam table. Pictures of your heart will be taken for about 15-20 minutes. You may also have a stress test. For this test, one of these things may be done: You will be asked to exercise on a treadmill or a stationary  bike. You will be given medicines that will make your heart work harder. This is done if you are unable to exercise. When blood flow to your heart has peaked, a tracer will again be given through the IV tube. After 20-40 minutes, you will get back on the exam table. More pictures will be taken of your heart. Depending on the tracer that is used, more pictures may need to be taken 3-4 hours later. Your IV tube will be removed when the test is over. The test may vary among doctors and hospitals. What happens after the test? Ask your doctor: Whether you can return to your normal schedule, including diet, activities, travel, and medicines. Whether you should drink more fluids. This will help to remove the tracer from your body. Ask your doctor, or the department that is doing the test: When will my results be ready? How will I get my results? What are my treatment options? What other tests do I need? What are my next steps? This information is not intended to replace advice given to you by your health care provider. Make sure you discuss any questions you have with your health care provider.   Document Revised: 02/03/2022 Document Reviewed: 02/03/2022 Elsevier Patient Education  2023 Elsevier Inc.  Echocardiogram An echocardiogram is a test that uses sound waves (ultrasound) to produce images of the heart. Images from an echocardiogram can provide important information about: Heart size and shape. The size and thickness and movement of your heart's walls. Heart muscle function and strength. Heart valve function or if you have stenosis. Stenosis is when the heart valves are too narrow. If blood is flowing backward through the heart valves (regurgitation). A tumor or infectious growth around the heart valves. Areas of heart muscle that are not working well because of poor blood flow or injury from a heart attack. Aneurysm detection. An aneurysm is a weak or damaged part of an artery wall. The  wall bulges out from the normal force of blood pumping through the body. Tell a health care provider about: Any allergies you have. All medicines you are taking, including vitamins, herbs, eye drops, creams, and over-the-counter medicines. Any blood disorders you have. Any surgeries you have had. Any medical conditions you have. Whether you are pregnant or may be pregnant. What are the risks? Generally, this is a safe test. However, problems may occur, including an allergic reaction to dye (contrast) that may be used during the test. What happens before the test? No specific preparation is needed. You may eat and drink normally. What happens during the test?  You will take off your clothes from the waist up and put on a hospital gown. Electrodes or electrocardiogram (ECG)patches may be placed on your chest. The electrodes or patches are then connected to a device that monitors your heart rate and rhythm. You will lie down on a table for an ultrasound exam. A gel will be applied to your chest to help sound waves pass through your skin. A handheld device, called a transducer, will be pressed against your chest and moved over your heart. The transducer produces sound waves that travel to your heart and bounce back (or "echo" back) to the transducer. These sound waves will be captured in real-time and changed into images of your heart that can be viewed on a video monitor. The images will be recorded on a computer and reviewed by your health care provider. You may be asked to change positions or hold your breath for a short time. This makes it easier to get different views or better views of your heart. In some cases, you may receive contrast through an IV in one of your veins. This can improve the quality of the pictures from your heart. The procedure may vary among health care providers and hospitals. What can I expect after the test? You may return to your normal, everyday life, including diet,  activities, and medicines, unless your health care provider tells you not to do that. Follow these instructions at home: It is up to you to get the results of your test. Ask your health care provider, or the department that is doing the test, when your results will be ready. Keep all follow-up visits. This is important. Summary An echocardiogram is a test that uses sound waves (ultrasound) to produce images of the heart. Images from an echocardiogram can provide important information about the size and shape of your heart, heart muscle function, heart valve function, and other possible heart problems. You do not need to do anything to prepare before this test. You may eat and drink normally. After the echocardiogram is completed, you may return to your normal, everyday life,   unless your health care provider tells you not to do that. This information is not intended to replace advice given to you by your health care provider. Make sure you discuss any questions you have with your health care provider. Document Revised: 05/21/2021 Document Reviewed: 04/30/2020 Elsevier Patient Education  2023 Elsevier Inc.    

## 2023-01-26 NOTE — Progress Notes (Signed)
Cardiology Office Note:    Date:  01/26/2023   ID:  [name], DOB [birth year], MRN [id number]  PCP:  [name], PA  Cardiologist:  Garwin Brothers, MD   Referring MD: [name], PA    ASSESSMENT:    1. Atrial fibrillation, unspecified type (HCC)   2. Essential hypertension   3. Panlobular emphysema (HCC)   4. Aortic atherosclerosis (HCC)   5. Coronary artery calcification   6. Primary cancer of right upper lobe of lung (HCC)    PLAN:    In order of problems listed above:  Aortic atherosclerosis, coronary artery calcification: Dyspnea on exertion: I discussed this with the patient at length we will do a Lexiscan sestamibi to assess for any ischemic substrate.  He is agreeable. Cardiac murmur: Echocardiogram will be done to assess murmur heard on auscultation.  This will also help assess left atrial anatomy. Essential hypertension: Blood pressure stable and diet was emphasized. Newly diagnosed atrial fibrillation:I discussed with the patient atrial fibrillation, disease process. Management and therapy including rate and rhythm control, anticoagulation benefits and potential risks were discussed extensively with the patient. Patient had multiple questions which were answered to patient's satisfaction. Patient has coronary artery disease and needs to be on statin therapy.  This will be addressed by primary care as I will see him pretty much once a year or so.  I discussed this with the patient and wife and told him to discuss this issue with primary care.  They are agreeable. Patient will be seen in follow-up appointment in 6 months or earlier if the patient has any concerns.    Medication Adjustments/Labs and Tests Ordered: Current medicines are reviewed at length with the patient today.  Concerns regarding medicines are outlined above.  No orders of the defined types were placed in this encounter.  No orders of the defined types were placed in this  encounter.    History of Present Illness:    [name] is a 72 y.o. male who is being seen today for the evaluation of newly diagnosed atrial fibrillation and dyspnea on exertion at the request of [name], [name].  Patient is a pleasant 72 year old male.  He has past medical history of recently diagnosed aortic atherosclerosis and coronary artery calcification on CT scan.  He gives history of some dyspnea on exertion.  He has been diagnosed to have atrial fibrillation and initiated on anticoagulation.  He denies any chest pain orthopnea or PND.  He has history of lung cancer and at this point he has been told not to have cancer though recently abnormal CT of the chest has let his doctors to initiate evaluation for the possibility of recurrence.  At the time of my evaluation, the patient is alert awake oriented and in no distress.  Overall he leads a sedentary lifestyle.  Past Medical History:  Diagnosis Date   Adjustment disorder with other symptom 11/27/2015   AKI (acute kidney injury) (HCC) 01/14/2016   Arthritis    Atrial fibrillation (HCC) 10/26/2011   BPH (benign prostatic hyperplasia) 2011   BRONCHOPNEUMONIA ORGANISM UNSPECIFIED 06/12/2010         Replacing diagnoses that were inactivated after the 12/21/22 regulatory import   Chronic respiratory failure with hypoxia (HCC) 08/27/2015   Congestive heart failure (HCC)    COPD (chronic obstructive pulmonary disease) (HCC)    COPD, severe (HCC) 11/27/2015   12/18/2015-pulmonary function test- FVC 2.75 (55% predicted), postbronchodilator ratio 56, postbronchodilator FEV1  1.66 (45% predicted), no significant bronchodilator response, mid flow reversibility, DLCO 33  >>>Severe obstructive airways disease, Severe diffusion defect      Depression    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 [name])    Drug-induced neutropenia (HCC) 10/08/2015   Encounter for antineoplastic chemotherapy 10/08/2015   Essential  hypertension 06/05/2008   Qualifier: Diagnosis of   By: Truman Hayward Duncan Dull), [name]       Gastrointestinal obstruction (HCC)    Ileus   GERD (gastroesophageal reflux disease)    Gout attack 08/29/2012   Hx of colonoscopy    Hypothyroidism 03/30/2012   Lung cancer (HCC) 2011   Lymphadenopathy syndrome 09/12/2015   Mediastinal adenopathy 09/12/2015   NHL (non-Hodgkin's lymphoma) (HCC) 09/25/2015   Obesity    Other dysphagia 11/11/2010   Qualifier: Diagnosis of   By: Marchelle Gearing MD, [name]       Polycythemia 01/26/2023   POLYCYTHEMIA 06/05/2008   Qualifier: Diagnosis of   By: Waldo Laine), [name]       Port catheter in place 08/20/2016   Post-nasal drip 10/04/2011   Primary cancer of right upper lobe of lung (HCC) 10/25/2019   Squamous cell   Pulmonary hypertension (HCC)    Pulmonary nodule, right 10/03/2019   Pure hypercholesterolemia 12/03/2011   Recurrent pneumonia 02/10/2012   Routine general medical examination at a health care facility 12/03/2011   Sepsis (HCC) 01/14/2016   Severe sepsis (HCC) 01/14/2016   Squamous cell carcinoma lung (HCC) 09/26/2010   S/p LUL resection Dr [name] dec 2011      Status post bronchoscopy with biopsy    Streptococcal pneumonia (HCC) 04/2008   Testicular swelling 09/12/2015    Past Surgical History:  Procedure Laterality Date   APPENDECTOMY     BACK SURGERY     CARDIOVERSION  11/27/2011   Procedure: CARDIOVERSION;  Surgeon: Vesta Mixer, MD;  Location: [facility] ENDOSCOPY;  Service: Cardiovascular;  Laterality: N/A;   COLONOSCOPY     FUDUCIAL PLACEMENT Right 10/13/2019   Procedure: Placement Of Fuducial;  Surgeon: Leslye Peer, MD;  Location[facility];  Service: Thoracic;  Laterality: Right;  Right Upper Lobe    LUNG CANCER SURGERY  2011   UPPER GI ENDOSCOPY     VIDEO BRONCHOSCOPY WITH ENDOBRONCHIAL NAVIGATION N/A 10/13/2019   Procedure: VIDEO BRONCHOSCOPY WITH ENDOBRONCHIAL NAVIGATION;  Surgeon: Leslye Peer, MD;  Location: [facility] OR;  Service:  Thoracic;  Laterality: N/A;   VIDEO BRONCHOSCOPY WITH ENDOBRONCHIAL ULTRASOUND N/A 10/13/2019   Procedure: VIDEO BRONCHOSCOPY WITH ENDOBRONCHIAL ULTRASOUND;  Surgeon: Leslye Peer, MD;  Location: [facility] OR;  Service: Thoracic;  Laterality: N/A;    Current Medications: Current Meds  Medication Sig   albuterol (PROVENTIL) (2.5 MG/3ML) 0.083% nebulizer solution Take 3 mLs (2.5 mg total) by nebulization every 4 (four) hours as needed for wheezing or shortness of breath.   allopurinol (ZYLOPRIM) 300 MG tablet Take 300 mg by mouth daily.   amLODipine (NORVASC) 5 MG tablet Take 5 mg by mouth daily.   apixaban (ELIQUIS) 5 MG TABS tablet Take 1 tablet (5 mg total) by mouth 2 (two) times daily.   Buprenorphine HCl-Naloxone HCl 8-2 MG FILM Place under the tongue 2 (two) times daily.   cetirizine (ZYRTEC) 10 MG tablet Take 10 mg by mouth daily.   diltiazem (CARDIZEM SR) 60 MG 12 hr capsule Take 1 capsule (60 mg total) by mouth 2 (two) times daily.   finasteride (PROSCAR) 5 MG tablet Take 5 mg by  mouth daily.   levothyroxine (SYNTHROID) 175 MCG tablet Take 175 mcg by mouth daily.   LINZESS 290 MCG CAPS capsule Take 290 mcg by mouth daily.   lisinopril (PRINIVIL,ZESTRIL) 10 MG tablet Take 1 tablet (10 mg total) by mouth daily.   omeprazole (PRILOSEC) 20 MG capsule Take 1 capsule (20 mg total) by mouth 2 (two) times daily.   predniSONE (STERAPRED UNI-PAK 21 TAB) 10 MG (21) TBPK tablet Take by mouth daily for 12 days. Take 6 tabs by mouth daily  for 2 days, then 5 tabs for 2 days, then 4 tabs for 2 days, then 3 tabs for 2 days, 2 tabs for 2 days, then 1 tab by mouth daily for 2 days   PROAIR HFA 108 (90 Base) MCG/ACT inhaler Inhale 2 puffs into the lungs every 6 (six) hours as needed for wheezing or shortness of breath.   sodium chloride (OCEAN) 0.65 % SOLN nasal spray Place 1 spray into both nostrils daily as needed for congestion.   TRELEGY ELLIPTA 100-62.5-25 MCG/INH AEPB Inhale 1 puff into the lungs daily.    Vitamin D, Ergocalciferol, (DRISDOL) 1.25 MG (50000 UNIT) CAPS capsule Take 50,000 Units by mouth once a week.     Allergies:   Omnipaque [iohexol]   Social History   Socioeconomic History   Marital status: Married    Spouse name: Not on file   Number of children: Not on file   Years of education: Not on file   Highest education level: Not on file  Occupational History   Occupation: truck driver  Tobacco Use   Smoking status: Former    Packs/day: 3.00    Years: 43.00    Additional pack years: 0.00    Total pack years: 129.00    Types: Cigarettes    Quit date: 10/22/2001    Years since quitting: 21.2   Smokeless tobacco: Never  Vaping Use   Vaping Use: Never used  Substance and Sexual Activity   Alcohol use: No   Drug use: No   Sexual activity: Yes  Other Topics Concern   Not on file  Social History Narrative   Resides in [place] with his wife.  Has 1 daughter and 1 granddaughter.   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   Social Determinants of Health   Financial Resource Strain: Not on file  Food Insecurity: Not on file  Transportation Needs: Not on file  Physical Activity: Not on file  Stress: Not on file  Social Connections: Not on file     Family History: The patient's family history includes Heart disease in his father. There is no history of Cancer, Diabetes, Stroke, Hypertension, or Kidney disease.  ROS:   Please see the history of present illness.    All other systems reviewed and are negative.  EKGs/Labs/Other Studies Reviewed:    The following studies were reviewed today: EKG reveals atrial fibrillation with well-controlled ventricular rate, anterior infarction of undetermined age and T wave inversions in the inferior leads.   Recent Labs: 07/10/2022: ALT 15 01/23/2023: B Natriuretic Peptide 169.8; BUN 16; Creatinine, Ser 1.26; Hemoglobin 13.8; Magnesium 1.7; Platelets 238; Potassium 4.7; Sodium 131; TSH 1.566   Recent Lipid Panel    Component Value Date/Time   CHOL 282 (H) 12/03/2011 1024   TRIG 85.0 12/03/2011 1024   HDL 54.60 12/03/2011 1024   CHOLHDL 5 12/03/2011 1024   VLDL 17.0 12/03/2011 1024   LDLDIRECT 205.4 12/03/2011 1024  Physical Exam:    VS:  BP 136/62   Pulse 79   Ht 6' (1.829 m)   Wt 208 lb (94.3 kg)   SpO2 95%   BMI 28.21 kg/m     Wt Readings from Last 3 Encounters:  01/26/23 208 lb (94.3 kg)  01/23/23 200 lb (90.7 kg)  07/10/22 225 lb 14.4 oz (102.5 kg)     GEN: Patient is in no acute distress HEENT: Normal NECK: No JVD; No carotid bruits LYMPHATICS: No lymphadenopathy CARDIAC: S1 S2 regular, 2/6 systolic murmur at the apex. RESPIRATORY:  Clear to auscultation without rales, wheezing or rhonchi  ABDOMEN: Soft, non-tender, non-distended MUSCULOSKELETAL:  No edema; No deformity  SKIN: Warm and dry NEUROLOGIC:  Alert and oriented x 3 PSYCHIATRIC:  Normal affect    Signed, Garwin Brothers, MD  01/26/2023 9:54 AM    [facility]

## 2023-02-02 ENCOUNTER — Telehealth: Payer: Self-pay | Admitting: Cardiology

## 2023-02-02 MED ORDER — APIXABAN 5 MG PO TABS: 5.0000 mg | ORAL_TABLET | Freq: Two times a day (BID) | ORAL | 3 refills | Status: DC

## 2023-02-02 MED ORDER — DILTIAZEM HCL ER 60 MG PO CP12: 60.0000 mg | ORAL_CAPSULE | Freq: Two times a day (BID) | ORAL | 3 refills | Status: DC

## 2023-02-02 NOTE — Telephone Encounter (Signed)
Pt c/o medication issue:  1. Name of Medication: apixaban (ELIQUIS) 5 MG TABS tablet  diltiazem (CARDIZEM SR) 60 MG 12 hr capsule  2. How are you currently taking this medication (dosage and times per day)? As Written  3. Are you having a reaction (difficulty breathing--STAT)? No  4. What is your medication issue? Patient is calling because he saw Dr. Tomie China on 05/07 and was told to continue taking the Eliquis. Patient was unsure if he should continue taking the Diltiazem while taking the Eliquis. Patient stated that if he is to continue both medications, he will need a refill for both the medications as well. Please advise.

## 2023-02-02 NOTE — Telephone Encounter (Signed)
Pt advised to continue both medication and refills have been sent.

## 2023-02-08 ENCOUNTER — Telehealth: Payer: Self-pay | Admitting: Cardiology

## 2023-02-08 MED ORDER — METOPROLOL SUCCINATE ER 25 MG PO TB24: 25.0000 mg | ORAL_TABLET | Freq: Every day | ORAL | 3 refills | Status: DC

## 2023-02-08 NOTE — Telephone Encounter (Signed)
RX called to the pharmacist at [facility] pharmacy as per Dr. Kem Parkinson note.  Pharmacist verbalized understanding and had no additional questions.

## 2023-02-08 NOTE — Telephone Encounter (Signed)
Pt c/o medication issue:  1. Name of Medication:   diltiazem (CARDIZEM SR) 60 MG 12 hr capsule    2. How are you currently taking this medication (dosage and times per day)? As written   3. Are you having a reaction (difficulty breathing--STAT)? No   4. What is your medication issue? Pharmacy called in stating pt's insurance does not cover this medication today and out of pocket is about $700. She asked if pt could back on something else. Please advise.

## 2023-02-10 ENCOUNTER — Encounter: Payer: Self-pay | Admitting: Internal Medicine

## 2023-02-10 ENCOUNTER — Other Ambulatory Visit (HOSPITAL_COMMUNITY): Payer: Self-pay

## 2023-02-10 ENCOUNTER — Telehealth (HOSPITAL_COMMUNITY): Payer: Self-pay | Admitting: *Deleted

## 2023-02-10 NOTE — Telephone Encounter (Signed)
Patient given detailed instructions per Myocardial Perfusion Study Information Sheet for the test on 02/17/2023 at 11:00. Patient notified to arrive 15 minutes early and that it is imperative to arrive on time for appointment to keep from having the test rescheduled.  If you need to cancel or reschedule your appointment, please call the office within 24 hours of your appointment. . Patient verbalized understanding.[name]

## 2023-02-11 ENCOUNTER — Inpatient Hospital Stay: Payer: PPO | Attending: Internal Medicine

## 2023-02-11 ENCOUNTER — Other Ambulatory Visit: Payer: Self-pay

## 2023-02-11 DIAGNOSIS — I2510 Atherosclerotic heart disease of native coronary artery without angina pectoris: Secondary | ICD-10-CM | POA: Insufficient documentation

## 2023-02-11 DIAGNOSIS — I272 Pulmonary hypertension, unspecified: Secondary | ICD-10-CM | POA: Diagnosis not present

## 2023-02-11 DIAGNOSIS — I700 Atherosclerosis of aorta: Secondary | ICD-10-CM | POA: Insufficient documentation

## 2023-02-11 DIAGNOSIS — M47814 Spondylosis without myelopathy or radiculopathy, thoracic region: Secondary | ICD-10-CM | POA: Insufficient documentation

## 2023-02-11 DIAGNOSIS — I6523 Occlusion and stenosis of bilateral carotid arteries: Secondary | ICD-10-CM | POA: Diagnosis not present

## 2023-02-11 DIAGNOSIS — I3139 Other pericardial effusion (noninflammatory): Secondary | ICD-10-CM | POA: Insufficient documentation

## 2023-02-11 DIAGNOSIS — I110 Hypertensive heart disease with heart failure: Secondary | ICD-10-CM | POA: Insufficient documentation

## 2023-02-11 DIAGNOSIS — Z9049 Acquired absence of other specified parts of digestive tract: Secondary | ICD-10-CM | POA: Insufficient documentation

## 2023-02-11 DIAGNOSIS — C3490 Malignant neoplasm of unspecified part of unspecified bronchus or lung: Secondary | ICD-10-CM

## 2023-02-11 DIAGNOSIS — Z79890 Hormone replacement therapy (postmenopausal): Secondary | ICD-10-CM | POA: Insufficient documentation

## 2023-02-11 DIAGNOSIS — E7800 Pure hypercholesterolemia, unspecified: Secondary | ICD-10-CM | POA: Insufficient documentation

## 2023-02-11 DIAGNOSIS — C3411 Malignant neoplasm of upper lobe, right bronchus or lung: Secondary | ICD-10-CM | POA: Diagnosis not present

## 2023-02-11 DIAGNOSIS — N200 Calculus of kidney: Secondary | ICD-10-CM | POA: Diagnosis not present

## 2023-02-11 DIAGNOSIS — Z79899 Other long term (current) drug therapy: Secondary | ICD-10-CM | POA: Insufficient documentation

## 2023-02-11 DIAGNOSIS — I509 Heart failure, unspecified: Secondary | ICD-10-CM | POA: Diagnosis not present

## 2023-02-11 DIAGNOSIS — Z95828 Presence of other vascular implants and grafts: Secondary | ICD-10-CM

## 2023-02-11 DIAGNOSIS — J432 Centrilobular emphysema: Secondary | ICD-10-CM | POA: Insufficient documentation

## 2023-02-11 DIAGNOSIS — D751 Secondary polycythemia: Secondary | ICD-10-CM | POA: Diagnosis not present

## 2023-02-11 DIAGNOSIS — E039 Hypothyroidism, unspecified: Secondary | ICD-10-CM | POA: Diagnosis not present

## 2023-02-11 DIAGNOSIS — R161 Splenomegaly, not elsewhere classified: Secondary | ICD-10-CM | POA: Diagnosis not present

## 2023-02-11 DIAGNOSIS — Z7901 Long term (current) use of anticoagulants: Secondary | ICD-10-CM | POA: Insufficient documentation

## 2023-02-11 DIAGNOSIS — K219 Gastro-esophageal reflux disease without esophagitis: Secondary | ICD-10-CM | POA: Diagnosis not present

## 2023-02-11 DIAGNOSIS — N400 Enlarged prostate without lower urinary tract symptoms: Secondary | ICD-10-CM | POA: Diagnosis not present

## 2023-02-11 DIAGNOSIS — I4891 Unspecified atrial fibrillation: Secondary | ICD-10-CM | POA: Diagnosis not present

## 2023-02-11 LAB — CBC WITH DIFFERENTIAL (CANCER CENTER ONLY)
Abs Immature Granulocytes: 0.18 10*3/uL — ABNORMAL HIGH (ref 0.00–0.07)
Basophils Absolute: 0.1 10*3/uL (ref 0.0–0.1)
Basophils Relative: 0 %
Eosinophils Absolute: 0.1 10*3/uL (ref 0.0–0.5)
Eosinophils Relative: 1 %
HCT: 38.5 % — ABNORMAL LOW (ref 39.0–52.0)
Hemoglobin: 12.7 g/dL — ABNORMAL LOW (ref 13.0–17.0)
Immature Granulocytes: 2 %
Lymphocytes Relative: 13 %
Lymphs Abs: 1.5 10*3/uL (ref 0.7–4.0)
MCH: 29.7 pg (ref 26.0–34.0)
MCHC: 33 g/dL (ref 30.0–36.0)
MCV: 90 fL (ref 80.0–100.0)
Monocytes Absolute: 0.9 10*3/uL (ref 0.1–1.0)
Monocytes Relative: 7 %
Neutro Abs: 9.1 10*3/uL — ABNORMAL HIGH (ref 1.7–7.7)
Neutrophils Relative %: 77 %
Platelet Count: 219 10*3/uL (ref 150–400)
RBC: 4.28 MIL/uL (ref 4.22–5.81)
RDW: 14.2 % (ref 11.5–15.5)
WBC Count: 11.9 10*3/uL — ABNORMAL HIGH (ref 4.0–10.5)
nRBC: 0 % (ref 0.0–0.2)

## 2023-02-11 LAB — CMP (CANCER CENTER ONLY)
ALT: 18 U/L (ref 0–44)
AST: 13 U/L — ABNORMAL LOW (ref 15–41)
Albumin: 3.9 g/dL (ref 3.5–5.0)
Alkaline Phosphatase: 98 U/L (ref 38–126)
Anion gap: 6 (ref 5–15)
BUN: 28 mg/dL — ABNORMAL HIGH (ref 8–23)
CO2: 26 mmol/L (ref 22–32)
Calcium: 9.3 mg/dL (ref 8.9–10.3)
Chloride: 107 mmol/L (ref 98–111)
Creatinine: 1.08 mg/dL (ref 0.61–1.24)
GFR, Estimated: >60 mL/min (ref >60)
Glucose, Bld: 109 mg/dL — ABNORMAL HIGH (ref 70–99)
Potassium: 4.5 mmol/L (ref 3.5–5.1)
Sodium: 139 mmol/L (ref 135–145)
Total Bilirubin: 0.6 mg/dL (ref 0.3–1.2)
Total Protein: 6.8 g/dL (ref 6.5–8.1)

## 2023-02-11 LAB — LACTATE DEHYDROGENASE: LDH: 90 U/L — ABNORMAL LOW (ref 98–192)

## 2023-02-11 MED ORDER — HEPARIN SOD (PORK) LOCK FLUSH 100 UNIT/ML IV SOLN
500.0000 [IU] | Freq: Once | INTRAVENOUS | Status: AC | PRN
  Administered 2023-02-11: 500 [IU] via INTRAVENOUS

## 2023-02-11 MED ORDER — SODIUM CHLORIDE 0.9% FLUSH
10.0000 mL | INTRAVENOUS | Status: DC | PRN
  Administered 2023-02-11: 10 mL via INTRAVENOUS

## 2023-02-12 ENCOUNTER — Other Ambulatory Visit: Payer: Self-pay

## 2023-02-12 ENCOUNTER — Telehealth: Payer: Self-pay | Admitting: Cardiology

## 2023-02-12 ENCOUNTER — Encounter (HOSPITAL_COMMUNITY)
Admission: RE | Admit: 2023-02-12 | Discharge: 2023-02-12 | Disposition: A | Discharge status: Home / Self Care | Payer: PPO | Source: Ambulatory Visit | Attending: Internal Medicine | Admitting: Internal Medicine

## 2023-02-12 ENCOUNTER — Emergency Department (HOSPITAL_COMMUNITY)
Admission: EM | Admit: 2023-02-12 | Discharge: 2023-02-12 | Disposition: A | Discharge status: Home / Self Care | Payer: PPO | Attending: Emergency Medicine | Admitting: Emergency Medicine

## 2023-02-12 ENCOUNTER — Emergency Department (HOSPITAL_COMMUNITY): Payer: PPO

## 2023-02-12 DIAGNOSIS — C3490 Malignant neoplasm of unspecified part of unspecified bronchus or lung: Secondary | ICD-10-CM | POA: Insufficient documentation

## 2023-02-12 DIAGNOSIS — Z7901 Long term (current) use of anticoagulants: Secondary | ICD-10-CM | POA: Diagnosis not present

## 2023-02-12 DIAGNOSIS — Z85118 Personal history of other malignant neoplasm of bronchus and lung: Secondary | ICD-10-CM | POA: Diagnosis not present

## 2023-02-12 DIAGNOSIS — R0609 Other forms of dyspnea: Secondary | ICD-10-CM | POA: Insufficient documentation

## 2023-02-12 DIAGNOSIS — R531 Weakness: Secondary | ICD-10-CM

## 2023-02-12 LAB — CBC WITH DIFFERENTIAL/PLATELET
Abs Immature Granulocytes: 0.19 10*3/uL — ABNORMAL HIGH (ref 0.00–0.07)
Basophils Absolute: 0.1 10*3/uL (ref 0.0–0.1)
Basophils Relative: 1 %
Eosinophils Absolute: 0.1 10*3/uL (ref 0.0–0.5)
Eosinophils Relative: 1 %
HCT: 40.2 % (ref 39.0–52.0)
Hemoglobin: 12.8 g/dL — ABNORMAL LOW (ref 13.0–17.0)
Immature Granulocytes: 2 %
Lymphocytes Relative: 15 %
Lymphs Abs: 1.5 10*3/uL (ref 0.7–4.0)
MCH: 29.2 pg (ref 26.0–34.0)
MCHC: 31.8 g/dL (ref 30.0–36.0)
MCV: 91.8 fL (ref 80.0–100.0)
Monocytes Absolute: 0.9 10*3/uL (ref 0.1–1.0)
Monocytes Relative: 8 %
Neutro Abs: 7.7 10*3/uL (ref 1.7–7.7)
Neutrophils Relative %: 73 %
Platelets: 211 10*3/uL (ref 150–400)
RBC: 4.38 MIL/uL (ref 4.22–5.81)
RDW: 14.2 % (ref 11.5–15.5)
WBC: 10.4 10*3/uL (ref 4.0–10.5)
nRBC: 0 % (ref 0.0–0.2)

## 2023-02-12 LAB — COMPREHENSIVE METABOLIC PANEL
ALT: 19 U/L (ref 0–44)
AST: 15 U/L (ref 15–41)
Albumin: 3.6 g/dL (ref 3.5–5.0)
Alkaline Phosphatase: 84 U/L (ref 38–126)
Anion gap: 8 (ref 5–15)
BUN: 27 mg/dL — ABNORMAL HIGH (ref 8–23)
CO2: 22 mmol/L (ref 22–32)
Calcium: 8.6 mg/dL — ABNORMAL LOW (ref 8.9–10.3)
Chloride: 107 mmol/L (ref 98–111)
Creatinine, Ser: 1.03 mg/dL (ref 0.61–1.24)
GFR, Estimated: >60 mL/min (ref >60)
Glucose, Bld: 108 mg/dL — ABNORMAL HIGH (ref 70–99)
Potassium: 4.3 mmol/L (ref 3.5–5.1)
Sodium: 137 mmol/L (ref 135–145)
Total Bilirubin: 0.5 mg/dL (ref 0.3–1.2)
Total Protein: 6.7 g/dL (ref 6.5–8.1)

## 2023-02-12 LAB — URINALYSIS, ROUTINE W REFLEX MICROSCOPIC
Bilirubin Urine: NEGATIVE
Glucose, UA: NEGATIVE mg/dL
Hgb urine dipstick: NEGATIVE
Ketones, ur: NEGATIVE mg/dL
Leukocytes,Ua: NEGATIVE
Nitrite: NEGATIVE
Protein, ur: NEGATIVE mg/dL
Specific Gravity, Urine: 1.013 (ref 1.005–1.030)
pH: 5 (ref 5.0–8.0)

## 2023-02-12 LAB — TSH: TSH: 3.042 u[IU]/mL (ref 0.350–4.500)

## 2023-02-12 LAB — GLUCOSE, CAPILLARY: Glucose-Capillary: 97 mg/dL (ref 70–99)

## 2023-02-12 MED ORDER — FLUDEOXYGLUCOSE F - 18 (FDG) INJECTION
10.4000 | Freq: Once | INTRAVENOUS | Status: AC
  Administered 2023-02-12: 10.37 via INTRAVENOUS

## 2023-02-12 MED ORDER — LACTATED RINGERS IV SOLN: INTRAVENOUS | Status: DC

## 2023-02-12 NOTE — ED Notes (Signed)
ED Provider at bedside. 

## 2023-02-12 NOTE — ED Provider Notes (Signed)
[facility] AT [facility] Provider Note   CSN: [id number] Arrival date & time: 02/12/23  7829     History  Chief Complaint  Patient presents with   Fatigue    [name] is a 72 y.o. male.  72 year old male with history of cancer in the past presents with 2 weeks of worsening diffuse nonfocal weakness.  No medication changes recently.  Saw his primary care doctor yesterday had blood work done which was reviewed.  He was concerned about a low hemoglobin as well as low iron.  Patient still notes some increased dyspnea on exertion.  Denies any new fever, cough, congestion.  No urinary symptoms at this time.  Symptoms are worse with activity.  No treatment use for this prior to arrival       Home Medications Prior to Admission medications   Medication Sig Start Date End Date Taking? Authorizing Provider  albuterol (PROVENTIL) (2.5 MG/3ML) 0.083% nebulizer solution Take 3 mLs (2.5 mg total) by nebulization every 4 (four) hours as needed for wheezing or shortness of breath. 10/02/17   Albertine Grates, MD  allopurinol (ZYLOPRIM) 300 MG tablet Take 300 mg by mouth daily. 01/15/17   [provider]  amLODipine (NORVASC) 5 MG tablet Take 5 mg by mouth daily. 01/21/20   [provider]  apixaban (ELIQUIS) 5 MG TABS tablet Take 1 tablet (5 mg total) by mouth 2 (two) times daily. 02/02/23 01/28/24  Revankar, Aundra Dubin, MD  Buprenorphine HCl-Naloxone HCl 8-2 MG FILM Place under the tongue 2 (two) times daily. 06/08/22   [provider]  cetirizine (ZYRTEC) 10 MG tablet Take 10 mg by mouth daily. 06/07/22   [provider]  finasteride (PROSCAR) 5 MG tablet Take 5 mg by mouth daily. 03/13/19   [provider]  levothyroxine (SYNTHROID) 175 MCG tablet Take 175 mcg by mouth daily. 10/29/22   [provider]  LINZESS 290 MCG CAPS capsule Take 290 mcg by mouth daily. 01/19/23   [provider]  lisinopril (PRINIVIL,ZESTRIL)  10 MG tablet Take 1 tablet (10 mg total) by mouth daily. 12/29/11   Nahser, Deloris Ping, MD  metoprolol succinate (TOPROL XL) 25 MG 24 hr tablet Take 1 tablet (25 mg total) by mouth daily. 02/08/23   Revankar, Aundra Dubin, MD  omeprazole (PRILOSEC) 20 MG capsule Take 1 capsule (20 mg total) by mouth 2 (two) times daily. 10/13/19   Leslye Peer, MD  PROAIR HFA 108 [phone] Base) MCG/ACT inhaler Inhale 2 puffs into the lungs every 6 (six) hours as needed for wheezing or shortness of breath. 10/02/17   Albertine Grates, MD  sodium chloride (OCEAN) 0.65 % SOLN nasal spray Place 1 spray into both nostrils daily as needed for congestion.    [provider]  TRELEGY ELLIPTA 100-62.5-25 MCG/INH AEPB Inhale 1 puff into the lungs daily. 05/08/19   [provider]  Vitamin D, Ergocalciferol, (DRISDOL) 1.25 MG (50000 UNIT) CAPS capsule Take 50,000 Units by mouth once a week. 04/20/22   [provider]      Allergies    Omnipaque [iohexol]    Review of Systems   Review of Systems  All other systems reviewed and are negative.   Physical Exam Updated Vital Signs BP (!) 145/68   Pulse 70   Temp 97.8 F (36.6 C) (Oral)   Resp 20   Ht 1.829 m (6')   Wt 90.7 kg   SpO2 97%   BMI 27.12 kg/m  Physical Exam Vitals and nursing note reviewed.  Constitutional:      General: He is not in acute distress.    Appearance: Normal appearance. He is well-developed. He is not toxic-appearing.  HENT:     Head: Normocephalic and atraumatic.  Eyes:     General: Lids are normal.     Conjunctiva/sclera: Conjunctivae normal.     Pupils: Pupils are equal, round, and reactive to light.  Neck:     Thyroid: No thyroid mass.     Trachea: No tracheal deviation.  Cardiovascular:     Rate and Rhythm: Normal rate and regular rhythm.     Heart sounds: Normal heart sounds. No murmur heard.    No gallop.  Pulmonary:     Effort: Pulmonary effort is normal. No respiratory distress.     Breath sounds: Normal breath  sounds. No stridor. No decreased breath sounds, wheezing, rhonchi or rales.  Abdominal:     General: There is no distension.     Palpations: Abdomen is soft.     Tenderness: There is no abdominal tenderness. There is no rebound.  Musculoskeletal:        General: No tenderness. Normal range of motion.     Cervical back: Normal range of motion and neck supple.  Skin:    General: Skin is warm and dry.     Findings: No abrasion or rash.  Neurological:     General: No focal deficit present.     Mental Status: He is alert and oriented to person, place, and time. Mental status is at baseline.     GCS: GCS eye subscore is 4. GCS verbal subscore is 5. GCS motor subscore is 6.     Cranial Nerves: No cranial nerve deficit.     Sensory: No sensory deficit.     Motor: Motor function is intact.  Psychiatric:        Attention and Perception: Attention normal.        Speech: Speech normal.        Behavior: Behavior normal.     ED Results / Procedures / Treatments   Labs (all labs ordered are listed, but only abnormal results are displayed) Labs Reviewed  CBC WITH DIFFERENTIAL/PLATELET  COMPREHENSIVE METABOLIC PANEL  URINALYSIS, ROUTINE W REFLEX MICROSCOPIC  TSH    EKG EKG Interpretation  Date/Time:  Friday Feb 12 2023 08:54:43 EDT Ventricular Rate:  78 PR Interval:    QRS Duration: 99 QT Interval:  399 QTC Calculation: 455 R Axis:   55 Text Interpretation: Atrial fibrillation Borderline low voltage, extremity leads No significant change since last tracing Confirmed by Lorre Nick ([id number]) on 02/12/2023 10:06:03 AM  Radiology No results found.  Procedures Procedures    Medications Ordered in ED Medications  lactated ringers infusion (has no administration in time range)    ED Course/ Medical Decision Making/ A&P                             Medical Decision Making Amount and/or Complexity of Data Reviewed Labs: ordered. Radiology: ordered. ECG/medicine tests:  ordered.  Risk Prescription drug management.   Patient is EKG per interpretation shows chronic A-fib which is unchanged from prior EKGs.  Patient's chest x-ray per my interpretation shows no new acute  pulmonary findings.  Does have a new 7 mm nodule in the right lung.  Has known history of lung cancer.  Patient is scheduled to have a PET scan.  Head CT per interpretation showed no acute findings.  Hemoglobin here is stable at 12.8. Urinalysis and electrolytes are without significant abnormality.  Discussed results with patient and wife who is at bedside and they are comfortable taking patient home.       Final Clinical Impression(s) / ED Diagnoses Final diagnoses:  None    Rx / DC Orders ED Discharge Orders     None         Lorre Nick, MD 02/12/23 1114

## 2023-02-12 NOTE — Telephone Encounter (Signed)
Spoke with spouse per DPR- Advised that an Iron supplement would be ok to take under the supervision of his PCP.

## 2023-02-12 NOTE — Telephone Encounter (Signed)
Pt c/o medication issue:  1. Name of Medication: Ferrous Sulfate 325 mc  2. How are you currently taking this medication (dosage and times per day)?   3. Are you having a reaction (difficulty breathing--STAT)?   4. What is your medication issue?  Patient 's primary provider puthim on this medicine- the question is, does Dr Revankar think that this medicine is alright for the patient to take?    

## 2023-02-12 NOTE — Telephone Encounter (Signed)
Pt c/o medication issue:  1. Name of Medication: Ferrous Sulfate 325 mc  2. How are you currently taking this medication (dosage and times per day)?   3. Are you having a reaction (difficulty breathing--STAT)?   4. What is your medication issue?  Patient 's primary provider puthim on this medicine- the question is, does Dr Tomie China think that this medicine is alright for the patient to take?

## 2023-02-12 NOTE — ED Triage Notes (Signed)
Pt arrived via POV. C/o malaise, irritability for 2x weeks. PCP was concerned about low Iron levels.   Aox4

## 2023-02-12 NOTE — Telephone Encounter (Signed)
Patient states he is returning a call. 

## 2023-02-14 ENCOUNTER — Encounter: Payer: Self-pay | Admitting: Internal Medicine

## 2023-02-14 ENCOUNTER — Other Ambulatory Visit: Payer: Self-pay

## 2023-02-14 ENCOUNTER — Emergency Department (HOSPITAL_COMMUNITY): Payer: PPO

## 2023-02-14 ENCOUNTER — Encounter (HOSPITAL_COMMUNITY): Payer: Self-pay

## 2023-02-14 ENCOUNTER — Emergency Department (HOSPITAL_COMMUNITY)
Admission: EM | Admit: 2023-02-14 | Discharge: 2023-02-14 | Disposition: A | Discharge status: Home / Self Care | Payer: PPO | Attending: Emergency Medicine | Admitting: Emergency Medicine

## 2023-02-14 DIAGNOSIS — I4891 Unspecified atrial fibrillation: Secondary | ICD-10-CM | POA: Diagnosis not present

## 2023-02-14 DIAGNOSIS — F17200 Nicotine dependence, unspecified, uncomplicated: Secondary | ICD-10-CM | POA: Diagnosis not present

## 2023-02-14 DIAGNOSIS — R531 Weakness: Secondary | ICD-10-CM | POA: Insufficient documentation

## 2023-02-14 DIAGNOSIS — G4700 Insomnia, unspecified: Secondary | ICD-10-CM | POA: Diagnosis not present

## 2023-02-14 DIAGNOSIS — Z79899 Other long term (current) drug therapy: Secondary | ICD-10-CM | POA: Diagnosis not present

## 2023-02-14 DIAGNOSIS — J441 Chronic obstructive pulmonary disease with (acute) exacerbation: Secondary | ICD-10-CM | POA: Diagnosis not present

## 2023-02-14 DIAGNOSIS — Z7901 Long term (current) use of anticoagulants: Secondary | ICD-10-CM | POA: Diagnosis not present

## 2023-02-14 DIAGNOSIS — I10 Essential (primary) hypertension: Secondary | ICD-10-CM | POA: Diagnosis not present

## 2023-02-14 LAB — COMPREHENSIVE METABOLIC PANEL
ALT: 19 U/L (ref 0–44)
AST: 24 U/L (ref 15–41)
Albumin: 3.3 g/dL — ABNORMAL LOW (ref 3.5–5.0)
Alkaline Phosphatase: 88 U/L (ref 38–126)
Anion gap: 11 (ref 5–15)
BUN: 26 mg/dL — ABNORMAL HIGH (ref 8–23)
CO2: 22 mmol/L (ref 22–32)
Calcium: 8.7 mg/dL — ABNORMAL LOW (ref 8.9–10.3)
Chloride: 104 mmol/L (ref 98–111)
Creatinine, Ser: 1.07 mg/dL (ref 0.61–1.24)
GFR, Estimated: >60 mL/min (ref >60)
Glucose, Bld: 101 mg/dL — ABNORMAL HIGH (ref 70–99)
Potassium: 4.5 mmol/L (ref 3.5–5.1)
Sodium: 137 mmol/L (ref 135–145)
Total Bilirubin: 0.4 mg/dL (ref 0.3–1.2)
Total Protein: 6.3 g/dL — ABNORMAL LOW (ref 6.5–8.1)

## 2023-02-14 LAB — RETICULOCYTES
Immature Retic Fract: 6 % (ref 2.3–15.9)
RBC.: 4.14 MIL/uL — ABNORMAL LOW (ref 4.22–5.81)
Retic Count, Absolute: 89.4 10*3/uL (ref 19.0–186.0)
Retic Ct Pct: 2.2 % (ref 0.4–3.1)

## 2023-02-14 LAB — FERRITIN: Ferritin: 174 ng/mL (ref 24–336)

## 2023-02-14 LAB — VITAMIN B12: Vitamin B-12: 503 pg/mL (ref 180–914)

## 2023-02-14 LAB — FOLATE: Folate: >40 ng/mL (ref >5.9)

## 2023-02-14 LAB — PHOSPHORUS: Phosphorus: 2.7 mg/dL (ref 2.5–4.6)

## 2023-02-14 LAB — IRON AND TIBC
Iron: 51 ug/dL (ref 45–182)
Saturation Ratios: 16 % — ABNORMAL LOW (ref 17.9–39.5)
TIBC: 311 ug/dL (ref 250–450)
UIBC: 260 ug/dL

## 2023-02-14 LAB — PROTIME-INR
INR: 1.2 (ref 0.8–1.2)
Prothrombin Time: 14.9 seconds (ref 11.4–15.2)

## 2023-02-14 LAB — TROPONIN I (HIGH SENSITIVITY): Troponin I (High Sensitivity): 9 ng/L (ref <18)

## 2023-02-14 LAB — MAGNESIUM: Magnesium: 2 mg/dL (ref 1.7–2.4)

## 2023-02-14 MED ORDER — ALBUTEROL SULFATE (2.5 MG/3ML) 0.083% IN NEBU
10.0000 mg | INHALATION_SOLUTION | Freq: Once | RESPIRATORY_TRACT | Status: AC
  Administered 2023-02-14: 10 mg via RESPIRATORY_TRACT
  Filled 2023-02-14: qty 12

## 2023-02-14 MED ORDER — METHYLPREDNISOLONE SODIUM SUCC 125 MG IJ SOLR
125.0000 mg | Freq: Once | INTRAMUSCULAR | Status: AC
  Administered 2023-02-14: 125 mg via INTRAVENOUS
  Filled 2023-02-14: qty 2

## 2023-02-14 MED ORDER — ZOLPIDEM TARTRATE 5 MG PO TABS: 5.0000 mg | ORAL_TABLET | Freq: Every evening | ORAL | 0 refills | Status: DC | PRN

## 2023-02-14 NOTE — Progress Notes (Signed)
[facility] OFFICE PROGRESS NOTE  [name], [name] [name] [place] [address]  DIAGNOSIS: 1) stage IA (T1c, N0, M0) non-small cell lung cancer, squamous cell carcinoma diagnosed in January 2021 2) Stage III large B-cell non-Hodgkin lymphoma diagnosed in December 2016 and presented with extensive lymphadenopathy involving the neck, chest, abdomen as well as splenomegaly.    PRIOR THERAPY: 1) Systemic chemotherapy with the CHOP/Rituxan every 3 weeks with Neulasta support. Status post 6 cycles. 2) SBRT to the right upper lobe squamous cell carcinoma under the care of Dr. Roselind Messier in February 2021  CURRENT THERAPY: Observation   INTERVAL HISTORY: [name] 72 y.o. male returns to the clinic today for a follow up visit. The patient was last seen by Dr. Arbutus Ped on 07/09/22. He is followed for his history of stage IA NSCLC in the RUL in 2021 s/p SBRT as well as history of NHL in 2016. He has been on observation and feeling fairly well. Earlier this month, he went to the ER for atrial fibrillation and SOB. He had CT chest at that time, which he was due for a restaging CT of the chest in April 2024, which showed RUL post radition changes with increased adjacent right apical consolidation, likely due to atelectasis, but PET was recommended to rule out disease recurrence. Therefore, he had a PET on 5/24 to further evaluate this.   Since last being seen, he is feeling fair. He reports fatigue.  He mentions he is seeing a cardiologist and had an echocardiogram and stress test performed today.  He also was put on Zetia due to statin sonogram with him.  Denies any fever, chills, night sweats, or weight loss. Denies any chest pain or hemoptysis.  He has had more dyspnea on exertion for the last 4 to 5 weeks due to the atrial fibrillation.  He denies any significant cough out of the ordinary.  Denies any nausea, vomiting, diarrhea, or constipation.  Denies any blood in the stool.   Denies any headache or visual changes. The patient is here today for evaluation and to review his scan results.    MEDICAL HISTORY: Past Medical History:  Diagnosis Date   Adjustment disorder with other symptom 11/27/2015   AKI (acute kidney injury) (HCC) 01/14/2016   Arthritis    Atrial fibrillation (HCC) 10/26/2011   BPH (benign prostatic hyperplasia) 2011   BRONCHOPNEUMONIA ORGANISM UNSPECIFIED 06/12/2010         Replacing diagnoses that were inactivated after the 12/21/22 regulatory import   Chronic respiratory failure with hypoxia (HCC) 08/27/2015   Congestive heart failure (HCC)    COPD (chronic obstructive pulmonary disease) (HCC)    COPD, severe (HCC) 11/27/2015   12/18/2015-pulmonary function test- FVC 2.75 (55% predicted), postbronchodilator ratio 56, postbronchodilator FEV1 1.66 (45% predicted), no significant bronchodilator response, mid flow reversibility, DLCO 33  >>>Severe obstructive airways disease, Severe diffusion defect      Depression    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)    Drug-induced neutropenia (HCC) 10/08/2015   Encounter for antineoplastic chemotherapy 10/08/2015   Essential hypertension 06/05/2008   Qualifier: Diagnosis of   By: Truman Hayward Duncan Dull), [name]       Gastrointestinal obstruction (HCC)    Ileus   GERD (gastroesophageal reflux disease)    Gout attack 08/29/2012   Hx of colonoscopy    Hypothyroidism 03/30/2012   Lung cancer (HCC) 2011   Lymphadenopathy syndrome 09/12/2015   Mediastinal adenopathy 09/12/2015  NHL (non-Hodgkin's lymphoma) (HCC) 09/25/2015   Obesity    Other dysphagia 11/11/2010   Qualifier: Diagnosis of   By: Marchelle Gearing MD, [name]       Polycythemia 01/26/2023   POLYCYTHEMIA 06/05/2008   Qualifier: Diagnosis of   By: Truman Hayward Duncan Dull), [facility] catheter in place 08/20/2016   Post-nasal drip 10/04/2011   Primary cancer of right upper lobe of lung (HCC) 10/25/2019   Squamous cell    Pulmonary hypertension (HCC)    Pulmonary nodule, right 10/03/2019   Pure hypercholesterolemia 12/03/2011   Recurrent pneumonia 02/10/2012   Routine general medical examination at a health care facility 12/03/2011   Sepsis (HCC) 01/14/2016   Severe sepsis (HCC) 01/14/2016   Squamous cell carcinoma lung (HCC) 09/26/2010   S/p LUL resection Dr [name] dec 2011      Status post bronchoscopy with biopsy    Streptococcal pneumonia (HCC) 04/2008   Testicular swelling 09/12/2015    ALLERGIES:  is allergic to omnipaque [iohexol].  MEDICATIONS:  Current Outpatient Medications  Medication Sig Dispense Refill   albuterol (PROVENTIL) (2.5 MG/3ML) 0.083% nebulizer solution Take 3 mLs (2.5 mg total) by nebulization every 4 (four) hours as needed for wheezing or shortness of breath. 75 mL 3   allopurinol (ZYLOPRIM) 300 MG tablet Take 300 mg by mouth daily.     amLODipine (NORVASC) 5 MG tablet Take 5 mg by mouth daily.     apixaban (ELIQUIS) 5 MG TABS tablet Take 1 tablet (5 mg total) by mouth 2 (two) times daily. 180 tablet 3   cetirizine (ZYRTEC) 10 MG tablet Take 10 mg by mouth daily.     ezetimibe (ZETIA) 10 MG tablet Take 10 mg by mouth daily.     ferrous sulfate 325 (65 FE) MG EC tablet Take 1 tablet by mouth 2 (two) times daily.     finasteride (PROSCAR) 5 MG tablet Take 5 mg by mouth daily.     levothyroxine (SYNTHROID) 175 MCG tablet Take 175 mcg by mouth daily.     LINZESS 290 MCG CAPS capsule Take 290 mcg by mouth daily.     lisinopril (PRINIVIL,ZESTRIL) 10 MG tablet Take 1 tablet (10 mg total) by mouth daily. 30 tablet 11   metoprolol succinate (TOPROL XL) 25 MG 24 hr tablet Take 1 tablet (25 mg total) by mouth daily. 90 tablet 3   omeprazole (PRILOSEC) 20 MG capsule Take 1 capsule (20 mg total) by mouth 2 (two) times daily.     PROAIR HFA 108 (90 Base) MCG/ACT inhaler Inhale 2 puffs into the lungs every 6 (six) hours as needed for wheezing or shortness of breath. 3 Inhaler 1   sodium  chloride (OCEAN) 0.65 % SOLN nasal spray Place 1 spray into both nostrils daily as needed for congestion.     TRELEGY ELLIPTA 100-62.5-25 MCG/INH AEPB Inhale 1 puff into the lungs daily.     Vitamin D, Ergocalciferol, (DRISDOL) 1.25 MG (50000 UNIT) CAPS capsule Take 50,000 Units by mouth once a week.     zolpidem (AMBIEN) 5 MG tablet Take 1 tablet (5 mg total) by mouth at bedtime as needed for sleep. 10 tablet 0   No current facility-administered medications for this visit.    SURGICAL HISTORY:  Past Surgical History:  Procedure Laterality Date   APPENDECTOMY     BACK SURGERY     CARDIOVERSION  11/27/2011   Procedure: CARDIOVERSION;  Surgeon: Vesta Mixer, MD;  Location: [facility] ENDOSCOPY;  Service:  Cardiovascular;  Laterality: N/A;   COLONOSCOPY     FUDUCIAL PLACEMENT Right 10/13/2019   Procedure: Placement Of Fuducial;  Surgeon: Leslye Peer, MD;  Location: [facility] OR;  Service: Thoracic;  Laterality: Right;  Right Upper Lobe    LUNG CANCER SURGERY  2011   UPPER GI ENDOSCOPY     VIDEO BRONCHOSCOPY WITH ENDOBRONCHIAL NAVIGATION N/A 10/13/2019   Procedure: VIDEO BRONCHOSCOPY WITH ENDOBRONCHIAL NAVIGATION;  Surgeon: Leslye Peer, MD;  Location: [facility] OR;  Service: Thoracic;  Laterality: N/A;   VIDEO BRONCHOSCOPY WITH ENDOBRONCHIAL ULTRASOUND N/A 10/13/2019   Procedure: VIDEO BRONCHOSCOPY WITH ENDOBRONCHIAL ULTRASOUND;  Surgeon: Leslye Peer, MD;  Location: [facility] OR;  Service: Thoracic;  Laterality: N/A;    REVIEW OF SYSTEMS:   Review of Systems  Constitutional: Positive for fatigue. Negative for appetite change, chills, fever and unexpected weight change.  HENT: Negative for mouth sores, nosebleeds, sore throat and trouble swallowing.   Eyes: Negative for eye problems and icterus.  Respiratory: Positive for dyspnea on exertion.  Negative for cough, hemoptysis,  and wheezing.   Cardiovascular: Negative for chest pain and leg swelling.  Gastrointestinal: Negative for abdominal pain,  constipation, diarrhea, nausea and vomiting.  Genitourinary: Negative for bladder incontinence, difficulty urinating, dysuria, frequency and hematuria.   Musculoskeletal: Negative for back pain, gait problem, neck pain and neck stiffness.  Skin: Negative for itching and rash.  Neurological: Negative for dizziness, extremity weakness, gait problem, headaches, light-headedness and seizures.  Hematological: Negative for adenopathy. Does not bruise/bleed easily.  Psychiatric/Behavioral: Negative for confusion, depression and sleep disturbance. The patient is not nervous/anxious.     PHYSICAL EXAMINATION:  Blood pressure 114/70, pulse 82, temperature 97.9 F (36.6 C), temperature source Oral, resp. rate 14, weight 206 lb 1.6 oz (93.5 kg), SpO2 98 %.  ECOG PERFORMANCE STATUS: 1  Physical Exam  Constitutional: Oriented to person, place, and time and well-developed, well-nourished, and in no distress.  HENT:  Head: Normocephalic and atraumatic.  Mouth/Throat: Oropharynx is clear and moist. No oropharyngeal exudate.  Eyes: Conjunctivae are normal. Right eye exhibits no discharge. Left eye exhibits no discharge. No scleral icterus.  Neck: Normal range of motion. Neck supple.  Cardiovascular: Normal rate, irregular rhythm, normal heart sounds and intact distal pulses.   Pulmonary/Chest: Effort normal.  Positive for expiratory wheezes bilaterally.  No respiratory distress. No wheezes. No rales.  Abdominal: Soft. Bowel sounds are normal. Exhibits no distension and no mass. There is no tenderness.  Musculoskeletal: Normal range of motion. Exhibits no edema.  Lymphadenopathy:    No cervical adenopathy.  Neurological: Alert and oriented to person, place, and time. Exhibits normal muscle tone. Gait normal. Coordination normal.  Skin: Positive for bruising on his upper extremities bilaterally.  Skin is warm and dry. No rash noted. Not diaphoretic. No erythema. No pallor.  Psychiatric: Mood, memory and  judgment normal.  Vitals reviewed.  LABORATORY DATA: Lab Results  Component Value Date   WBC 10.4 02/12/2023   HGB 12.8 (L) 02/12/2023   HCT 40.2 02/12/2023   MCV 91.8 02/12/2023   PLT 211 02/12/2023      Chemistry      Component Value Date/Time   NA 137 02/14/2023 1852   NA 140 09/07/2017 1322   K 4.5 02/14/2023 1852   K 5.2 No visable hemolysis (H) 09/07/2017 1322   CL 104 02/14/2023 1852   CO2 22 02/14/2023 1852   CO2 33 (H) 09/07/2017 1322   BUN 26 (H) 02/14/2023 1852   BUN  13.6 09/07/2017 1322   CREATININE 1.07 02/14/2023 1852   CREATININE 1.08 02/11/2023 1042   CREATININE 1.0 09/07/2017 1322      Component Value Date/Time   CALCIUM 8.7 (L) 02/14/2023 1852   CALCIUM 9.1 09/07/2017 1322   ALKPHOS 88 02/14/2023 1852   ALKPHOS 92 09/07/2017 1322   AST 24 02/14/2023 1852   AST 13 (L) 02/11/2023 1042   AST 11 09/07/2017 1322   ALT 19 02/14/2023 1852   ALT 18 02/11/2023 1042   ALT 8 09/07/2017 1322   BILITOT 0.4 02/14/2023 1852   BILITOT 0.6 02/11/2023 1042   BILITOT 0.47 09/07/2017 1322       RADIOGRAPHIC STUDIES:  ECHOCARDIOGRAM COMPLETE  Result Date: 02/17/2023    ECHOCARDIOGRAM REPORT   Patient Name:   [name] Date of Exam: 02/17/2023 Medical Rec #:  [id number]      Height:       72.0 in Accession #:    [id number]     Weight:       200.0 lb Date of Birth:  Mar 17, 1951     BSA:          2.131 m Patient Age:    71 years       BP:           136/62 mmHg Patient Gender: M              HR:           84 bpm. Exam Location:  [place] Procedure: 2D Echo, Cardiac Doppler, Color Doppler and Strain Analysis Indications:    Atrial fibrillation, unspecified type (HCC) [I48.91 (ICD-10-CM)]  History:        Patient has prior history of Echocardiogram examinations, most                 recent 09/30/2015. CHF, COPD, Signs/Symptoms:Pulmonary                 hypertension; Risk Factors:Hypertension.  Sonographer:    Louie Boston RDCS Referring Phys: [facility]   Sonographer Comments: Technically difficult study due to poor echo windows. Global longitudinal strain was attempted. IMPRESSIONS  1. Left ventricular ejection fraction, by estimation, is 60 to 65%. The left ventricle has normal function. The left ventricle has no regional wall motion abnormalities. There is moderate concentric left ventricular hypertrophy. Left ventricular diastolic parameters are indeterminate.  2. Right ventricular systolic function is normal. The right ventricular size is normal. There is normal pulmonary artery systolic pressure.  3. Left atrial size was mildly dilated.  4. Right atrial size was mildly dilated.  5. The mitral valve is normal in structure. No evidence of mitral valve regurgitation. No evidence of mitral stenosis.  6. The aortic valve is tricuspid. Aortic valve regurgitation is not visualized. Aortic valve sclerosis is present, with no evidence of aortic valve stenosis.  7. Aortic Normal DTA and ascending aorta. There is mild dilatation of the aortic root, measuring 38 mm.  8. The inferior vena cava is normal in size with greater than 50% respiratory variability, suggesting right atrial pressure of 3 mmHg. FINDINGS  Left Ventricle: Left ventricular ejection fraction, by estimation, is 60 to 65%. The left ventricle has normal function. The left ventricle has no regional wall motion abnormalities. Definity contrast agent was given IV to delineate the left ventricular  endocardial borders. The left ventricular internal cavity size was normal in size. There is moderate concentric left ventricular hypertrophy. Left ventricular diastolic parameters are indeterminate.  Indeterminate filling pressures. Right Ventricle: The right ventricular size is normal. No increase in right ventricular wall thickness. Right ventricular systolic function is normal. There is normal pulmonary artery systolic pressure. The tricuspid regurgitant velocity is 2.28 m/s, and  with an assumed right atrial  pressure of 3 mmHg, the estimated right ventricular systolic pressure is 23.8 mmHg. Left Atrium: Left atrial size was mildly dilated. Right Atrium: Right atrial size was mildly dilated. Pericardium: There is no evidence of pericardial effusion. Mitral Valve: The mitral valve is normal in structure. No evidence of mitral valve regurgitation. No evidence of mitral valve stenosis. Tricuspid Valve: The tricuspid valve is normal in structure. Tricuspid valve regurgitation is mild . No evidence of tricuspid stenosis. Aortic Valve: The aortic valve is tricuspid. Aortic valve regurgitation is not visualized. Aortic valve sclerosis is present, with no evidence of aortic valve stenosis. Pulmonic Valve: The pulmonic valve was normal in structure. Pulmonic valve regurgitation is not visualized. No evidence of pulmonic stenosis. Aorta: The aortic arch was not well visualized and Normal DTA and ascending aorta. There is mild dilatation of the aortic root, measuring 38 mm. Venous: The pulmonary veins were not well visualized. The inferior vena cava is normal in size with greater than 50% respiratory variability, suggesting right atrial pressure of 3 mmHg. IAS/Shunts: No atrial level shunt detected by color flow Doppler.  LEFT VENTRICLE PLAX 2D LVIDd:         4.90 cm   Diastology LVIDs:         4.00 cm   LV e' medial:    5.11 cm/s LV PW:         1.30 cm   LV E/e' medial:  13.4 LV IVS:        1.40 cm   LV e' lateral:   6.42 cm/s LVOT diam:     2.00 cm   LV E/e' lateral: 10.7 LV SV:         51 LV SV Index:   24 LVOT Area:     3.14 cm  RIGHT VENTRICLE             IVC RV Basal diam:  3.70 cm     IVC diam: 1.80 cm RV S prime:     13.10 cm/s TAPSE (M-mode): 2.5 cm LEFT ATRIUM             Index        RIGHT ATRIUM           Index LA diam:        3.60 cm 1.69 cm/m   RA Area:     23.40 cm LA Vol (A2C):   89.8 ml 42.15 ml/m  RA Volume:   77.80 ml  36.52 ml/m LA Vol (A4C):   65.3 ml 30.65 ml/m LA Biplane Vol: 83.5 ml 39.19 ml/m  AORTIC  VALVE LVOT Vmax:   81.23 cm/s LVOT Vmean:  53.067 cm/s LVOT VTI:    0.163 m  AORTA Ao Root diam: 3.80 cm Ao Asc diam:  3.60 cm Ao Desc diam: 2.10 cm MV E velocity: 68.70 cm/s  TRICUSPID VALVE MV A velocity: 33.50 cm/s  TR Peak grad:   20.8 mmHg MV E/A ratio:  2.05        TR Vmax:        228.00 cm/s                             SHUNTS  Systemic VTI:  0.16 m                            Systemic Diam: 2.00 cm Norman Herrlich MD Electronically signed by Norman Herrlich MD Signature Date/Time: 02/17/2023/11:55:24 AM    Final    NM PET Image Restage (PS) Skull Base to Thigh (F-18 FDG)  Result Date: 02/16/2023 CLINICAL DATA:  Subsequent treatment strategy for non-small cell lung cancer. Increasing right apical consolidation on recent chest CT. EXAM: NUCLEAR MEDICINE PET SKULL BASE TO THIGH TECHNIQUE: 10.37 mCi F-18 FDG was injected intravenously. Full-ring PET imaging was performed from the skull base to thigh after the radiotracer. CT data was obtained and used for attenuation correction and anatomic localization. Fasting blood glucose: 97 mg/dl COMPARISON:  Chest CT 16/06/9603, abdominopelvic CT 07/10/2022 and PET-CT 09/20/2019. FINDINGS: Mediastinal blood pool activity: SUV max 2.7 NECK: No hypermetabolic cervical lymph nodes are identified.Fairly symmetric activity within the lymphoid tissue of Waldeyer's ring is within physiologic limits.No suspicious activity identified within the pharyngeal mucosal space. Incidental CT findings: Bilateral carotid atherosclerosis. CHEST: There are no hypermetabolic mediastinal, hilar or axillary lymph nodes. No hypermetabolic pulmonary activity or suspicious nodularity. Specifically, no suspicious recurrent hypermetabolic activity within the right upper lobe consolidation (SUV max 3.4), attributed to prior radiation therapy. Incidental CT findings: Right IJ Port-A-Cath extends to the superior cavoatrial junction. Atherosclerosis of the aorta, great vessels and  coronary arteries. Moderate centrilobular emphysema with central airway thickening and right-sided pleural calcifications. ABDOMEN/PELVIS: There is no hypermetabolic activity within the liver, adrenal glands, spleen or pancreas. There is no hypermetabolic nodal activity in the abdomen or pelvis. There is new moderate focal hypermetabolic activity along the medial wall of the cecum near the ileocecal valve (SUV max 6.7). No well-defined mass is seen, and this could be secondary to inflammation. Cannot exclude a villous adenoma. No evidence of bowel obstruction. Incidental CT findings: Nonobstructing bilateral renal calculi. Simple renal cysts are again noted bilaterally without hypermetabolic activity; no specific follow-up imaging recommended. Aortic and branch vessel atherosclerosis. SKELETON: There is no hypermetabolic activity to suggest osseous metastatic disease. Incidental CT findings: none IMPRESSION: 1. No evidence of local recurrence of right upper lobe lung cancer or metastatic disease. No suspicious metabolic activity within the right apical consolidation which is attributed to prior radiation therapy. 2. New focal hypermetabolic activity along the medial wall of the cecum near the ileocecal valve. This could be secondary to inflammation or a villous adenoma. Consider further evaluation with colonoscopy if not recently performed. 3. Aortic Atherosclerosis (ICD10-I70.0) and Emphysema (ICD10-J43.9). Electronically Signed   By: Carey Bullocks M.D.   On: 02/16/2023 17:07   CT Head Wo Contrast  Result Date: 02/12/2023 CLINICAL DATA:  Headache, increasing frequency or severity. EXAM: CT HEAD WITHOUT CONTRAST TECHNIQUE: Contiguous axial images were obtained from the base of the skull through the vertex without intravenous contrast. RADIATION DOSE REDUCTION: This exam was performed according to the departmental dose-optimization program which includes automated exposure control, adjustment of the mA and/or  kV according to patient size and/or use of iterative reconstruction technique. COMPARISON:  Head CT 01/31/2005. FINDINGS: Brain: No acute intracranial hemorrhage. Mild chronic small-vessel disease. Gray-white differentiation is otherwise preserved. No hydrocephalus or extra-axial collection. No mass effect or midline shift. Vascular: No hyperdense vessel or unexpected calcification. Skull: No calvarial fracture or suspicious bone lesion. Small left middle cranial fossa meningocele (axial image 53 series 3, coronal image 34 series 4). Partial fusion  of the craniocervical junction. Sinuses/Orbits: Underdeveloped mastoid air cells and frontal sinuses. Orbits are unremarkable. Other: None. IMPRESSION: 1. No acute intracranial abnormality. 2. Mild chronic small-vessel disease. 3. Small left middle cranial fossa meningocele. Electronically Signed   By: Orvan Falconer M.D.   On: 02/12/2023 09:47   DG Chest 2 View  Result Date: 02/12/2023 CLINICAL DATA:  Shortness of breath Fatigue for 2-3 weeks EXAM: CHEST - 2 VIEW COMPARISON:  Chest CT 01/23/2023 FINDINGS: Cardiomediastinal silhouette and pulmonary vasculature are unchanged in appearance. Right IJ chest port unchanged in position. Right apical opacity unchanged from prior examination. 7 mm nodular opacity in the inferolateral right lung appears to be new since prior examinations. Attention on follow-up exams recommended. IMPRESSION: 1. No acute cardiopulmonary process. 2. Unchanged right apical opacity consistent with previously treated non-small cell lung malignancy. 3. New 7 mm nodular opacity in the inferolateral right lung. Attention on follow-up exam recommended. Electronically Signed   By: Acquanetta Belling M.D.   On: 02/12/2023 09:38   CT Chest W Contrast  Result Date: 01/23/2023 CLINICAL DATA:  Pleural effusion; * Tracking Code: BO * EXAM: CT CHEST WITH CONTRAST TECHNIQUE: Multidetector CT imaging of the chest was performed during intravenous contrast  administration. RADIATION DOSE REDUCTION: This exam was performed according to the departmental dose-optimization program which includes automated exposure control, adjustment of the mA and/or kV according to patient size and/or use of iterative reconstruction technique. CONTRAST:  75mL OMNIPAQUE IOHEXOL 350 MG/ML SOLN COMPARISON:  Chest CT dated July 07, 2022 FINDINGS: Cardiovascular: Heart size. Pericardial effusion. Normal caliber thoracic aorta with moderate atherosclerotic disease. Dilated main pulmonary artery, measuring up to 3.9 cm. Moderate coronary artery calcifications. Mediastinum/Nodes: Esophagus is unremarkable. Mildly enlarged pre-vascular lymph node measuring 10 mm on series 2, image 49, unchanged when compared with the prior exam. No enlarging lymph nodes seen in the chest. Lungs/Pleura: Right upper lobe postradiation change increased right apical consolidation. Mild debris seen in the right mainstem bronchus. Mild bilateral cylindrical bronchiectasis. Innumerable centrilobular nodules are seen in the bilateral lower lobes with more confluent areas of consolidation seen in the left lower lobe. No pleural effusion or pneumothorax. Right pleural calcifications, likely sequela of prior pneumothorax or empyema. Upper Abdomen: Numerous bilateral low-attenuation renal lesions, largest are compatible with simple cysts, others are too small to accurately characterize, no specific follow-up imaging is recommended. Unchanged solid lesion of the anterior spleen measuring 2.8 cm on series 2, image 179, non FDG avid on prior PET-CT and likely benign. Musculoskeletal: No chest wall abnormality. No acute or significant osseous findings. IMPRESSION: 1. Right upper lobe postradiation change with increased adjacent right apical consolidation, likely due to atelectasis. Recommend PET-CT to assess for recurrent disease. 2. No evidence of metastatic disease in the chest. 3. Mild debris of the right mainstem bronchus  and new innumerable centrilobular of the bilateral lower lobes with more confluent areas of consolidation seen in the left lower lobe, findings are likely due to aspiration. Recommend attention on follow-up. 4. Dilated main pulmonary artery, can be seen in the setting of pulmonary hypertension. 5. Coronary artery calcifications and aortic Atherosclerosis (ICD10-I70.0). Electronically Signed   By: Allegra Lai M.D.   On: 01/23/2023 20:10   DG Chest 2 View  Result Date: 01/23/2023 CLINICAL DATA:  Insert his EXAM: CHEST - 2 VIEW COMPARISON:  October 13, 2019 October seventeenth 2023 FINDINGS: The cardiomediastinal silhouette is unchanged in contour.RIGHT chest port with tip terminating over the superior cavoatrial junction. There is increased confluent  opacification of the RIGHT apex in comparison to October 2023 CT scan. No LEFT-sided pleural effusion. Calcified pleural plaques. Visualized abdomen is unremarkable. Multilevel degenerative changes of the thoracic spine. IMPRESSION: Increased confluent opacification of the RIGHT apex in comparison to October 2023 CT scan. Differential is broad and includes infection, atelectasis, progression of known malignancy or increased pleural thickening/loculated pleural effusion. This would be better assessed with dedicated contrast enhanced CT chest. Electronically Signed   By: Meda Klinefelter M.D.   On: 01/23/2023 13:11     ASSESSMENT/PLAN:  This is a very pleasant 72 year old Caucasian male with 1) stage IIIA large B-cell non-Hodgkin lymphoma status post 6 cycles of systemic chemotherapy with CHOP/Rituxan with almost complete response. The patient has been on observation for several years. 2) stage IA (T1c, N0, M0) non-small cell lung cancer, squamous cell carcinoma presented with right upper lobe pulmonary mass.  This was found on repeat imaging studies with CT scan of the chest as well as a PET scan that showed hypermetabolic activity in the right upper lobe  lung mass with no evidence for lymphadenopathy or distant metastatic disease.  The patient underwent SBRT to the recurrent non-small cell lung cancer, squamous cell carcinoma of the right upper lobe. He tolerated the procedure well.   CT scan from May 2024 showed RUL post radition changes with increased adjacent right apical consolidation, likely due to atelectasis, but PET was recommended to rule out disease recurrence. Therefore, he had a PET on 5/24 to further evaluate this.   The patient was seen with Dr. Arbutus Ped. Dr. Arbutus Ped personally and independently reviewed the scan and discussed the results with the patient today. The scan showed no evidence of disease progression.  There was a new focal area of hypermetabolism in the cecum.   Dr. Arbutus Ped recommends that he continue on observation with a repeat CT scan of the chest in 6 months.  We will order this without contrast due to his dye allergy.  For the new focal area of hypermetabolism, his last colonoscopy was 7 years ago.  He is supposed to repeat colonoscopy in 3 years.  He was previously seen by a gastroenterologist in [place] who has since left the practice.  Regardless, the patient and his wife would like to reestablish in [place].  We will refer him to [facility] to see if repeat colonoscopy is warranted since his last one was 7 years ag to be on the safe side due to the new hypermetabolism.  Patient understands this could be inflammatory or polyp but is in agreement to get GIs opinion.   We will see him back for follow-up in 6 months a few days after his CT scan to review the results.  The patient was advised to call immediately if he has any concerning symptoms in the interval. The patient voices understanding of current disease status and treatment options and is in agreement with the current care plan. All questions were answered. The patient knows to call the clinic with any problems, questions or concerns. We can certainly see  the patient much sooner if necessary      Orders Placed This Encounter  Procedures   CT CHEST WO CONTRAST    Standing Status:   Future    Standing Expiration Date:   02/17/2024    Order Specific Question:   Preferred imaging location?    Answer:   [facility]   CBC with Differential (Cancer Center Only)    Standing Status:  Future    Standing Expiration Date:   02/17/2024   CMP (Cancer Center only)    Standing Status:   Future    Standing Expiration Date:   02/17/2024   Ambulatory referral to Gastroenterology    Referral Priority:   Routine    Referral Type:   Consultation    Referral Reason:   Specialty Services Required    Number of Visits Requested:   1     Oden Lindaman L Samyria Rudie, PA-C 02/17/23  ADDENDUM: Hematology/Oncology Attending: I had a face-to-face encounter with the patient today.  I reviewed his record, lab, scan and recommended his care plan.  This is a very pleasant 72 years old white male with history of stage Ia non-small cell lung cancer, squamous cell carcinoma diagnosed in January 2021 in addition to history of stage III large B cell non-Hodgkin lymphoma diagnosed in December 2016 status post SBRT to the lung nodule in addition to 6 cycles of systemic chemotherapy to the non-Hodgkin lymphoma.  The patient has been in observation since 2021.  He was seen recently at the emergency department with shortness of breath secondary to atrial fibrillation and during his workup he had CT scan of the chest that showed right upper lobe postradiation changes with increased adjacent right apical consolidation and atelectasis with concern about disease recurrence. I recommended for the patient to have a PET scan which was performed recently.  I personally and independently reviewed the PET scan images and discussed the result with the patient and his wife. His scan showed no concerning finding for disease recurrence in the chest, abdomen and pelvis but there was new  focal hypermetabolic activity along the medial wall of the cecum near the ileocecal valve could be inflammatory or villous adenoma.  His last colonoscopy was 7 years ago. I recommended for the patient to continue on observation with repeat CT scan of the chest in 6 months. For the hypermetabolic focus in the colon, we will refer him to gastroenterology for evaluation and to rule out any malignancy. The patient and his wife are in agreement with the current plan. He was advised to call immediately if he has any other concerning symptoms in the interval. The total time spent in the appointment was 30 minutes. Disclaimer: This note was dictated with voice recognition software. Similar sounding words can inadvertently be transcribed and may be missed upon review. Lajuana Matte, MD

## 2023-02-14 NOTE — ED Triage Notes (Signed)
Pt bib ems from home c/o generalized weakness that has been going on for 3 weeks but weakness has worsened the last 3 days. Pt states he hasn't been able to sleep and his knees hurt bilaterally.  BP 118/78 HR 92 RR 16 RA 95% CBG 108

## 2023-02-14 NOTE — ED Notes (Signed)
Patient and family refuses Chest Xray, due to recent chest Xray at [facility]

## 2023-02-14 NOTE — ED Notes (Signed)
Pt stating that they had an xray done a couple of days ago and are refusing an xray at this time, RN informed.

## 2023-02-14 NOTE — ED Notes (Addendum)
Ambulation challenge performed, patient ambulated 281ft with pulse Ox, patient's HR 104 and PsO2 98% RA prior to ambulation, HR 124 and PsO2 96% RA during ambulation. Patient ambulated well, however he reports feeling weak during trial

## 2023-02-14 NOTE — Discharge Instructions (Addendum)
Your testing here has been reassuring, there is no obvious signs of significant abnormalities, I suspect that a lot of your fatigue and weakness is coming from the fact that you are not sleeping very well.  I would like for you to take 5 mg of Ambien to try to help sleep at night.  You may also take melatonin but please avoid Benadryl.  Thank you for allowing [place] to treat you in the emergency department today.  After reviewing your examination and potential testing that was done it appears that you are safe to go home.  I would like for you to follow-up with your doctor within the next several days, have them obtain your results and follow-up with them to review all of these tests.  If you should develop severe or worsening symptoms return to the emergency department immediately

## 2023-02-14 NOTE — ED Provider Notes (Signed)
[place] EMERGENCY DEPARTMENT AT [facility] Provider Note   CSN: [id number] Arrival date & time: 02/14/23  1828     History  Chief Complaint  Patient presents with   Weakness    [name] is a 72 y.o. male.   Weakness  This patient is a 72 year old male, he has a history of hypertension on amlodipine, atrial fibrillation on Eliquis, he is on lisinopril, metoprolol, omeprazole and Trelegy.  He has a history of some reactive airway disease from years of smoking, history of cancer status post upper lung resection, he reports that he had recently seen his doctor at the clinic in [facility] at the [facility], he was told that there was some type of iron binding study that was abnormal, he was told to come to the hospital on Friday, as well as being seen 3 weeks prior to that for his COPD exacerbation.  During these episodes he was found to have a normal TSH, normal troponin, the lab workups have been rather unremarkable including urinalysis and his hemoglobin which came back in essentially normal range 2 days ago.  The patient was seen in the office for atrial fibrillation on May 7, he is scheduled for a Lexi scan this coming week because of dyspnea on exertion  The patient denies any fevers, states he is not having chest pain or shortness of breath at this time, denies any swelling of his legs, states that both of his feet burn when he is wearing his shoes but not when he takes his shoes off.  He denies any recent travel or trauma.  He is taking the medications that he is prescribed exactly as prescribed.  The patient and family members are perseverating on an idea that there is some type of iron binding test that was abnormal despite having totally normal hemoglobin levels.  The patient reports to me that he has not slept well in the last 3 weeks.  He is getting approximately 2 hours of sleep per night, he has not been prescribed sleep aid according to his  report.    Home Medications Prior to Admission medications   Medication Sig Start Date End Date Taking? Authorizing Provider  zolpidem (AMBIEN) 5 MG tablet Take 1 tablet (5 mg total) by mouth at bedtime as needed for sleep. 02/14/23  Yes Eber Hong, MD  albuterol (PROVENTIL) (2.5 MG/3ML) 0.083% nebulizer solution Take 3 mLs (2.5 mg total) by nebulization every 4 (four) hours as needed for wheezing or shortness of breath. 10/02/17   Albertine Grates, MD  allopurinol (ZYLOPRIM) 300 MG tablet Take 300 mg by mouth daily. 01/15/17   [provider]  amLODipine (NORVASC) 5 MG tablet Take 5 mg by mouth daily. 01/21/20   [provider]  apixaban (ELIQUIS) 5 MG TABS tablet Take 1 tablet (5 mg total) by mouth 2 (two) times daily. 02/02/23 01/28/24  Revankar, Aundra Dubin, MD  Buprenorphine HCl-Naloxone HCl 8-2 MG FILM Place under the tongue 2 (two) times daily. 06/08/22   [provider]  cetirizine (ZYRTEC) 10 MG tablet Take 10 mg by mouth daily. 06/07/22   [provider]  finasteride (PROSCAR) 5 MG tablet Take 5 mg by mouth daily. 03/13/19   [provider]  levothyroxine (SYNTHROID) 175 MCG tablet Take 175 mcg by mouth daily. 10/29/22   [provider]  LINZESS 290 MCG CAPS capsule Take 290 mcg by mouth daily. 01/19/23   [provider]  lisinopril (PRINIVIL,ZESTRIL) 10 MG  tablet Take 1 tablet (10 mg total) by mouth daily. 12/29/11   Nahser, Deloris Ping, MD  metoprolol succinate (TOPROL XL) 25 MG 24 hr tablet Take 1 tablet (25 mg total) by mouth daily. 02/08/23   Revankar, Aundra Dubin, MD  omeprazole (PRILOSEC) 20 MG capsule Take 1 capsule (20 mg total) by mouth 2 (two) times daily. 10/13/19   Leslye Peer, MD  PROAIR HFA [phone] Base) MCG/ACT inhaler Inhale 2 puffs into the lungs every 6 (six) hours as needed for wheezing or shortness of breath. 10/02/17   Albertine Grates, MD  sodium chloride (OCEAN) 0.65 % SOLN nasal spray Place 1 spray into both nostrils daily as needed for  congestion.    [provider]  TRELEGY ELLIPTA 100-62.5-25 MCG/INH AEPB Inhale 1 puff into the lungs daily. 05/08/19   [provider]  Vitamin D, Ergocalciferol, (DRISDOL) 1.25 MG (50000 UNIT) CAPS capsule Take 50,000 Units by mouth once a week. 04/20/22   [provider]      Allergies    Omnipaque [iohexol]    Review of Systems   Review of Systems  Neurological:  Positive for weakness.  All other systems reviewed and are negative.   Physical Exam Updated Vital Signs BP 133/81 (BP Location: Right Arm)   Pulse 81   Temp 98.4 F (36.9 C) (Oral)   Resp 17   Ht 1.829 m (6')   Wt 90.7 kg   SpO2 97%   BMI 27.12 kg/m  Physical Exam Vitals and nursing note reviewed.  Constitutional:      General: He is not in acute distress.    Appearance: He is well-developed.  HENT:     Head: Normocephalic and atraumatic.     Mouth/Throat:     Pharynx: No oropharyngeal exudate.  Eyes:     General: No scleral icterus.       Right eye: No discharge.        Left eye: No discharge.     Conjunctiva/sclera: Conjunctivae normal.     Pupils: Pupils are equal, round, and reactive to light.  Neck:     Thyroid: No thyromegaly.     Vascular: No JVD.  Cardiovascular:     Rate and Rhythm: Normal rate and regular rhythm.     Heart sounds: Normal heart sounds. No murmur heard.    No friction rub. No gallop.  Pulmonary:     Effort: Pulmonary effort is normal. No respiratory distress.     Breath sounds: Wheezing and rhonchi present. No rales.  Abdominal:     General: Bowel sounds are normal. There is no distension.     Palpations: Abdomen is soft. There is no mass.     Tenderness: There is no abdominal tenderness.  Musculoskeletal:        General: No tenderness. Normal range of motion.     Cervical back: Normal range of motion and neck supple.     Right lower leg: No edema.     Left lower leg: No edema.  Lymphadenopathy:     Cervical: No cervical adenopathy.  Skin:     General: Skin is warm and dry.     Findings: No erythema or rash.  Neurological:     General: No focal deficit present.     Mental Status: He is alert.     Coordination: Coordination normal.     Comments: The patient is able to sit up in the bed by himself, he has normal strength in all  4 extremities although he appears generally weak and tired  Psychiatric:        Behavior: Behavior normal.     ED Results / Procedures / Treatments   Labs (all labs ordered are listed, but only abnormal results are displayed) Labs Reviewed  IRON AND TIBC - Abnormal; Notable for the following components:      Result Value   Saturation Ratios 16 (*)    All other components within normal limits  RETICULOCYTES - Abnormal; Notable for the following components:   RBC. 4.14 (*)    All other components within normal limits  COMPREHENSIVE METABOLIC PANEL - Abnormal; Notable for the following components:   Glucose, Bld 101 (*)    BUN 26 (*)    Calcium 8.7 (*)    Total Protein 6.3 (*)    Albumin 3.3 (*)    All other components within normal limits  VITAMIN B12  FOLATE  FERRITIN  MAGNESIUM  PHOSPHORUS  PROTIME-INR  BLOOD GAS, VENOUS  TROPONIN I (HIGH SENSITIVITY)    EKG None  Radiology No results found.  Procedures Procedures    Medications Ordered in ED Medications  methylPREDNISolone sodium succinate (SOLU-MEDROL) 125 mg/2 mL injection 125 mg (125 mg Intravenous Given 02/14/23 2001)  albuterol (PROVENTIL) (2.5 MG/3ML) 0.083% nebulizer solution 10 mg (10 mg Nebulization Given 02/14/23 1930)    ED Course/ Medical Decision Making/ A&P                             Medical Decision Making Amount and/or Complexity of Data Reviewed Labs: ordered. ECG/medicine tests: ordered.  Risk Prescription drug management.    This patient presents to the ED for concern of generalized weakness, this involves an extensive number of treatment options, and is a complaint that carries with it a high  risk of complications and morbidity.  The differential diagnosis includes electrolyte abnormalities, renal dysfunction, cardiac dysfunction, sleep deprivation and sleep that   Co morbidities that complicate the patient evaluation  Chronic lung disease, prior cancer   Additional history obtained:  Additional history obtained from medical record External records from outside source obtained and reviewed including notes from office as well as recent ER visits and multiple lab tests   Lab Tests:  I Ordered, and personally interpreted labs.  The pertinent results include: Normal vitamin B12, normal folate, ferritin is normal, red blood counts are very close to normal, CMP shows no significant renal dysfunction or electrolyte abnormalities, bilirubin and LFTs are normal, magnesium and phosphorus are normal, INR is 1.2 and troponin is 9.   Patient refuses chest x-ray   Cardiac Monitoring: / EKG:  The patient was maintained on a cardiac monitor.  I personally viewed and interpreted the cardiac monitored which showed an underlying rhythm of: Atrial fibrillation, the patient has a history of atrial fibrillation and is in permanent A-fib, treated and anticoagulated.   I observe the patient on a cardiac monitor while obtaining labs, he was able to ambulate with the nurse and had an oxygen level that was appropriate, he did get a little bit tachycardic with walking but this resolved when he rested.   Problem List / ED Course / Critical interventions / Medication management  Severe fatigue and weakness, vital signs are reassuring, he is not hypotensive febrile or hypoxic, his lab work has been very unremarkable and he appears well I ordered medication including methylprednisolone and albuterol for wheezing Reevaluation of the patient after these  medicines showed that the patient improved I have reviewed the patients home medicines and have made adjustments as needed   Social Determinants of  Health:  The patient is chronically ill to some degree   Test / Admission - Considered:  Considered admission but the patient was able to be discharged home, he is agreeable to this plan wholeheartedly as is the family and they are comfortable with the labs that I have gone over with them.  They are aware of all of the results, he requests a medication for sleep.  At this time he will be discharged home.  I think this is all reasonable and he can follow-up in the outpatient setting         Final Clinical Impression(s) / ED Diagnoses Final diagnoses:  Generalized weakness  Insomnia, unspecified type  Atrial fibrillation, unspecified type [facility])    Rx / DC Orders ED Discharge Orders          Ordered    zolpidem (AMBIEN) 5 MG tablet  At bedtime PRN        02/14/23 2220              Eber Hong, MD 02/14/23 2221

## 2023-02-16 ENCOUNTER — Telehealth: Payer: Self-pay

## 2023-02-16 NOTE — Telephone Encounter (Signed)
This nurse reached out to patient's spouse related to after hours message received about patient having low iron and was advised to go to ED by PCP due to having extremely low iron levels.  Wife states that patient is not able to ambulate at this time and was told that his blood levels were good when he went to the ED.  Patients wife would like to know what may be causing the symptoms.  This nurse spoke wit provider who reviewed the chart and states that his labs are not abnormal and his iron levels are within normal range.  Patient had PET completed which is pending a final reading but does not appear to have any significant findings so far.  However, his symptoms are not related to his iron levels.  Recommends following up with primary care physician.  No further concerns noted at this time.    No answer.  Will also reach out on My Chart.

## 2023-02-17 ENCOUNTER — Ambulatory Visit: Payer: PPO | Attending: Cardiology

## 2023-02-17 ENCOUNTER — Inpatient Hospital Stay (HOSPITAL_BASED_OUTPATIENT_CLINIC_OR_DEPARTMENT_OTHER): Payer: PPO | Admitting: Physician Assistant

## 2023-02-17 VITALS — BP 114/70 | HR 82 | Temp 97.9°F | Resp 14 | Wt 206.1 lb

## 2023-02-17 DIAGNOSIS — I4891 Unspecified atrial fibrillation: Secondary | ICD-10-CM

## 2023-02-17 DIAGNOSIS — C3411 Malignant neoplasm of upper lobe, right bronchus or lung: Secondary | ICD-10-CM

## 2023-02-17 DIAGNOSIS — R948 Abnormal results of function studies of other organs and systems: Secondary | ICD-10-CM

## 2023-02-17 DIAGNOSIS — C3491 Malignant neoplasm of unspecified part of right bronchus or lung: Secondary | ICD-10-CM

## 2023-02-17 DIAGNOSIS — C8332 Diffuse large B-cell lymphoma, intrathoracic lymph nodes: Secondary | ICD-10-CM

## 2023-02-17 LAB — MYOCARDIAL PERFUSION IMAGING
LV dias vol: 123 mL (ref 62–150)
Nuc Stress EF: 35 %
Peak HR: 86 {beats}/min
Rest Nuclear Isotope Dose: 11 mCi
SSS: 4
Stress Nuclear Isotope Dose: 32.8 mCi
TID: 1.17

## 2023-02-17 LAB — ECHOCARDIOGRAM COMPLETE: S' Lateral: 4 cm

## 2023-02-17 MED ORDER — TECHNETIUM TC 99M TETROFOSMIN IV KIT
32.8000 | PACK | Freq: Once | INTRAVENOUS | Status: AC | PRN
  Administered 2023-02-17: 32.8 via INTRAVENOUS

## 2023-02-17 MED ORDER — TECHNETIUM TC 99M TETROFOSMIN IV KIT
11.0000 | PACK | Freq: Once | INTRAVENOUS | Status: AC | PRN
  Administered 2023-02-17: 11 via INTRAVENOUS

## 2023-02-17 MED ORDER — PERFLUTREN LIPID MICROSPHERE
1.0000 mL | INTRAVENOUS | Status: AC | PRN
Start: 2023-02-17 — End: 2023-02-17
  Administered 2023-02-17: 6 mL via INTRAVENOUS

## 2023-02-17 MED ORDER — REGADENOSON 0.4 MG/5ML IV SOLN
0.4000 mg | Freq: Once | INTRAVENOUS | Status: AC
Start: 2023-02-17 — End: 2023-02-17
  Administered 2023-02-17: 0.4 mg via INTRAVENOUS

## 2023-03-02 LAB — MYOCARDIAL PERFUSION IMAGING
LV sys vol: 80 mL
Rest HR: 75 {beats}/min
SDS: 2
SRS: 2
ST Depression (mm): 0 mm

## 2023-03-04 ENCOUNTER — Telehealth: Payer: Self-pay | Admitting: Gastroenterology

## 2023-03-04 NOTE — Telephone Encounter (Signed)
We can see him in the office.  Can book for new patient visit with any MD or PA with first available opening.  Thanks

## 2023-03-04 NOTE — Telephone Encounter (Signed)
Hi Dr. Adela Lank,   Supervising Provider: 03/04/23-AM  We received a referral for patient to be evaluated for an Abnormal PET scan of colon. The patient does have GI history over at [facility] Dr. Charm Barges who is no longer practicing, records were obtained and scanned into Media for you to review and advise.    Thank you

## 2023-03-09 ENCOUNTER — Encounter: Payer: Self-pay | Admitting: Gastroenterology

## 2023-03-09 NOTE — Telephone Encounter (Signed)
Called patient to schedule left voicemail. 

## 2023-04-01 ENCOUNTER — Inpatient Hospital Stay: Payer: PPO | Attending: Internal Medicine

## 2023-04-01 ENCOUNTER — Other Ambulatory Visit: Payer: Self-pay

## 2023-04-01 DIAGNOSIS — Z95828 Presence of other vascular implants and grafts: Secondary | ICD-10-CM

## 2023-04-01 DIAGNOSIS — Z8572 Personal history of non-Hodgkin lymphomas: Secondary | ICD-10-CM | POA: Insufficient documentation

## 2023-04-01 DIAGNOSIS — C3411 Malignant neoplasm of upper lobe, right bronchus or lung: Secondary | ICD-10-CM | POA: Insufficient documentation

## 2023-04-01 MED ORDER — HEPARIN SOD (PORK) LOCK FLUSH 100 UNIT/ML IV SOLN
500.0000 [IU] | Freq: Once | INTRAVENOUS | Status: AC | PRN
  Administered 2023-04-01: 500 [IU] via INTRAVENOUS

## 2023-04-01 MED ORDER — SODIUM CHLORIDE 0.9% FLUSH
10.0000 mL | INTRAVENOUS | Status: DC | PRN
  Administered 2023-04-01: 10 mL via INTRAVENOUS

## 2023-04-05 ENCOUNTER — Other Ambulatory Visit: Payer: Self-pay

## 2023-04-07 ENCOUNTER — Encounter: Payer: Self-pay | Admitting: Cardiology

## 2023-04-07 ENCOUNTER — Ambulatory Visit: Payer: PPO | Attending: Cardiology | Admitting: Cardiology

## 2023-04-07 VITALS — BP 130/70 | HR 67 | Ht 72.0 in | Wt 213.4 lb

## 2023-04-07 DIAGNOSIS — I4891 Unspecified atrial fibrillation: Secondary | ICD-10-CM

## 2023-04-07 DIAGNOSIS — R0609 Other forms of dyspnea: Secondary | ICD-10-CM

## 2023-04-07 DIAGNOSIS — I700 Atherosclerosis of aorta: Secondary | ICD-10-CM | POA: Diagnosis not present

## 2023-04-07 DIAGNOSIS — J431 Panlobular emphysema: Secondary | ICD-10-CM | POA: Diagnosis not present

## 2023-04-07 DIAGNOSIS — I2510 Atherosclerotic heart disease of native coronary artery without angina pectoris: Secondary | ICD-10-CM

## 2023-04-07 DIAGNOSIS — I10 Essential (primary) hypertension: Secondary | ICD-10-CM | POA: Diagnosis not present

## 2023-04-07 DIAGNOSIS — E7800 Pure hypercholesterolemia, unspecified: Secondary | ICD-10-CM

## 2023-04-07 DIAGNOSIS — I2584 Coronary atherosclerosis due to calcified coronary lesion: Secondary | ICD-10-CM

## 2023-04-07 HISTORY — DX: Other forms of dyspnea: R06.09

## 2023-04-07 MED ORDER — PREDNISONE 50 MG PO TABS: ORAL_TABLET | ORAL | 0 refills | Status: DC

## 2023-04-07 NOTE — H&P (View-Only) (Signed)
Cardiology Office Note:    Date:  04/07/2023   ID:  [name], DOB 28-Aug-1951, MRN [id number]  PCP:  [name], PA  Cardiologist:  Garwin Brothers, MD   Referring MD: [name], PA    ASSESSMENT:    1. Aortic atherosclerosis (HCC)   2. Coronary artery calcification   3. Essential hypertension   4. Panlobular emphysema (HCC)   5. Pure hypercholesterolemia   6. Atrial fibrillation, unspecified type (HCC)   7. DOE (dyspnea on exertion)    PLAN:    In order of problems listed above:  Coronary artery calcification: Aortic atherosclerosis: Dyspnea on exertion: Patient's symptoms are very concerning.  His ejection fraction was moderately depressed on stress test.  There was no evidence of ischemia.  He has coronary artery calcification.  Therefore following recommendations were made.I discussed coronary angiography and left heart catheterization with the patient at extensive length. Procedure, benefits and potential risks were explained. Patient had multiple questions which were answered to the patient's satisfaction. Patient agreed and consented for the procedure. Further recommendations will be made based on the findings of the coronary angiography. In the interim. The patient has any significant symptoms he knows to go to the nearest emergency room. Persistent atrial fibrillation:I discussed with the patient atrial fibrillation, disease process. Management and therapy including rate and rhythm control, anticoagulation benefits and potential risks were discussed extensively with the patient. Patient had multiple questions which were answered to patient's satisfaction. Mixed dyslipidemia: Diet was emphasized.  Patient is on lipid-lowering therapy. Further recommendations will depend on the findings of the coronary angiography.  Patient and wife had multiple questions which were answered to their satisfaction.   Medication Adjustments/Labs and Tests Ordered: Current  medicines are reviewed at length with the patient today.  Concerns regarding medicines are outlined above.  Orders Placed This Encounter  Procedures   EKG 12-Lead   No orders of the defined types were placed in this encounter.    No chief complaint on file.    History of Present Illness:    [name] is a 72 y.o. male.  Patient has past medical history of coronary artery calcification, aortic atherosclerosis, persistent atrial fibrillation, essential hypertension and mixed dyslipidemia.  Patient is being evaluated for lung cancer however patient and wife mentioned to me that now they have received the final recommendation from the doctors and there is no history of cancer at this time.  Patient underwent stress test for dyspnea on exertion.  This did not reveal any evidence of ischemia but ejection fraction was depressed.  Echocardiogram EF was unremarkable.  Patient tells me that he quit smoking in 2003 but his dyspnea on exertion is getting worse.  No chest pain.  At the time of my evaluation, the patient is alert awake oriented and in no distress.  Past Medical History:  Diagnosis Date   Adjustment disorder with other symptom 11/27/2015   AKI (acute kidney injury) (HCC) 01/14/2016   Aortic atherosclerosis (HCC) 01/26/2023   Arthritis    Atrial fibrillation (HCC) 10/26/2011   BPH (benign prostatic hyperplasia) 2011   BRONCHOPNEUMONIA ORGANISM UNSPECIFIED 06/12/2010         Replacing diagnoses that were inactivated after the 12/21/22 regulatory import   Carpal tunnel syndrome, right upper limb 11/03/2022   Chronic respiratory failure with hypoxia (HCC) 08/27/2015   Congestive heart failure (HCC)    COPD (chronic obstructive pulmonary disease) (HCC)    COPD, severe (HCC) 11/27/2015  12/18/2015-pulmonary function test- FVC 2.75 (55% predicted), postbronchodilator ratio 56, postbronchodilator FEV1 1.66 (45% predicted), no significant bronchodilator response, mid flow reversibility,  DLCO 33  >>>Severe obstructive airways disease, Severe diffusion defect      Coronary artery calcification 01/26/2023   Depression    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 [facility])    Drug-induced neutropenia (HCC) 10/08/2015   Encounter for antineoplastic chemotherapy 10/08/2015   Essential hypertension 06/05/2008   Qualifier: Diagnosis of   By: Truman Hayward Duncan Dull), [name]       Gastrointestinal obstruction (HCC)    Ileus   GERD (gastroesophageal reflux disease)    Gout attack 08/29/2012   Hx of colonoscopy    Hypothyroidism 03/30/2012   Lesion of ulnar nerve, right upper limb 11/03/2022   Lung cancer (HCC) 2011   Lymphadenopathy syndrome 09/12/2015   Mediastinal adenopathy 09/12/2015   NHL (non-Hodgkin's lymphoma) (HCC) 09/25/2015   Numbness of right hand 06/28/2022   Obesity    Open wound of right hand without foreign body 07/27/2021   Other dysphagia 11/11/2010   Qualifier: Diagnosis of   By: Marchelle Gearing MD, [name]       Polycythemia 01/26/2023   POLYCYTHEMIA 06/05/2008   Qualifier: Diagnosis of   By: Waldo Laine), [name]       Port catheter in place 08/20/2016   Post-nasal drip 10/04/2011   Primary cancer of right upper lobe of lung (HCC) 10/25/2019   Squamous cell   Pulmonary hypertension (HCC)    Pulmonary nodule, right 10/03/2019   Pure hypercholesterolemia 12/03/2011   Recurrent pneumonia 02/10/2012   Routine general medical examination at a health care facility 12/03/2011   Sepsis (HCC) 01/14/2016   Severe sepsis (HCC) 01/14/2016   Squamous cell carcinoma lung (HCC) 09/26/2010   S/p LUL resection Dr [name] dec 2011      Status post bronchoscopy with biopsy    Stiffness of finger joint of right hand 09/24/2021   Streptococcal pneumonia (HCC) 04/2008   Testicular swelling 09/12/2015    Past Surgical History:  Procedure Laterality Date   APPENDECTOMY     BACK SURGERY     CARDIOVERSION  11/27/2011   Procedure: CARDIOVERSION;  Surgeon:  Vesta Mixer, MD;  Location: [facility] ENDOSCOPY;  Service: Cardiovascular;  Laterality: N/A;   COLONOSCOPY     FUDUCIAL PLACEMENT Right 10/13/2019   Procedure: Placement Of Fuducial;  Surgeon: Leslye Peer, MD;  Location: [facility] OR;  Service: Thoracic;  Laterality: Right;  Right Upper Lobe    LUNG CANCER SURGERY  2011   UPPER GI ENDOSCOPY     VIDEO BRONCHOSCOPY WITH ENDOBRONCHIAL NAVIGATION N/A 10/13/2019   Procedure: VIDEO BRONCHOSCOPY WITH ENDOBRONCHIAL NAVIGATION;  Surgeon: Leslye Peer, MD;  Location: [facility] OR;  Service: Thoracic;  Laterality: N/A;   VIDEO BRONCHOSCOPY WITH ENDOBRONCHIAL ULTRASOUND N/A 10/13/2019   Procedure: VIDEO BRONCHOSCOPY WITH ENDOBRONCHIAL ULTRASOUND;  Surgeon: Leslye Peer, MD;  Location: [facility] OR;  Service: Thoracic;  Laterality: N/A;    Current Medications: Current Meds  Medication Sig   albuterol (PROVENTIL) (2.5 MG/3ML) 0.083% nebulizer solution Take 3 mLs (2.5 mg total) by nebulization every 4 (four) hours as needed for wheezing or shortness of breath.   allopurinol (ZYLOPRIM) 300 MG tablet Take 300 mg by mouth daily.   amLODipine (NORVASC) 5 MG tablet Take 5 mg by mouth daily.   apixaban (ELIQUIS) 5 MG TABS tablet Take 1 tablet (5 mg total) by mouth 2 (two) times daily.   Buprenorphine HCl-Naloxone HCl 8-2  MG FILM Place 1 Film under the tongue 2 (two) times daily.   cetirizine (ZYRTEC) 10 MG tablet Take 10 mg by mouth daily.   ezetimibe (ZETIA) 10 MG tablet Take 10 mg by mouth daily.   ferrous sulfate 325 (65 FE) MG EC tablet Take 1 tablet by mouth 2 (two) times daily.   finasteride (PROSCAR) 5 MG tablet Take 5 mg by mouth daily.   levothyroxine (SYNTHROID) 175 MCG tablet Take 175 mcg by mouth daily.   LINZESS 290 MCG CAPS capsule Take 290 mcg by mouth daily.   lisinopril (PRINIVIL,ZESTRIL) 10 MG tablet Take 1 tablet (10 mg total) by mouth daily.   metoprolol succinate (TOPROL XL) 25 MG 24 hr tablet Take 1 tablet (25 mg total) by mouth daily.   omeprazole  (PRILOSEC) 20 MG capsule Take 1 capsule (20 mg total) by mouth 2 (two) times daily.   PROAIR HFA 108 (90 Base) MCG/ACT inhaler Inhale 2 puffs into the lungs every 6 (six) hours as needed for wheezing or shortness of breath.   sodium chloride (OCEAN) 0.65 % SOLN nasal spray Place 1 spray into both nostrils daily as needed for congestion.   TRELEGY ELLIPTA 100-62.5-25 MCG/INH AEPB Inhale 1 puff into the lungs daily.   Vitamin D, Ergocalciferol, (DRISDOL) 1.25 MG (50000 UNIT) CAPS capsule Take 50,000 Units by mouth once a week.   zolpidem (AMBIEN) 10 MG tablet Take 10 mg by mouth at bedtime.     Allergies:   Omnipaque [iohexol]   Social History   Socioeconomic History   Marital status: Married    Spouse name: Not on file   Number of children: Not on file   Years of education: Not on file   Highest education level: Not on file  Occupational History   Occupation: truck driver  Tobacco Use   Smoking status: Former    Current packs/day: 0.00    Average packs/day: 3.0 packs/day for 43.0 years (129.0 ttl pk-yrs)    Types: Cigarettes    Start date: 10/22/1958    Quit date: 10/22/2001    Years since quitting: 21.4   Smokeless tobacco: Never  Vaping Use   Vaping status: Never Used  Substance and Sexual Activity   Alcohol use: No   Drug use: No   Sexual activity: Yes  Other Topics Concern   Not on file  Social History Narrative   Resides in [place] with his wife.  Has 1 daughter and 1 granddaughter.   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   Social Determinants of Health   Financial Resource Strain: Not on file  Food Insecurity: Not on file  Transportation Needs: Not on file  Physical Activity: Not on file  Stress: Not on file  Social Connections: Not on file     Family History: The patient's family history includes Heart disease in his father. There is no history of Cancer, Diabetes, Stroke, Hypertension, or Kidney disease.  ROS:   Please see  the history of present illness.    All other systems reviewed and are negative.  EKGs/Labs/Other Studies Reviewed:    The following studies were reviewed today: EKG reveals atrial fibrillation with well-controlled ventricular rate.   Recent Labs: 01/23/2023: B Natriuretic Peptide 169.8 02/12/2023: Hemoglobin 12.8; Platelets 211; TSH 3.042 02/14/2023: ALT 19; BUN 26; Creatinine, Ser 1.07; Magnesium 2.0; Potassium 4.5; Sodium 137  Recent Lipid Panel    Component Value Date/Time   CHOL 282 (H) 12/03/2011 1024  TRIG 85.0 12/03/2011 1024   HDL 54.60 12/03/2011 1024   CHOLHDL 5 12/03/2011 1024   VLDL 17.0 12/03/2011 1024   LDLDIRECT 205.4 12/03/2011 1024    Physical Exam:    VS:  BP 130/70   Pulse 67   Ht 6' (1.829 m)   Wt 213 lb 6.4 oz (96.8 kg)   SpO2 95%   BMI 28.94 kg/m     Wt Readings from Last 3 Encounters:  04/07/23 213 lb 6.4 oz (96.8 kg)  02/17/23 206 lb 1.6 oz (93.5 kg)  02/17/23 208 lb (94.3 kg)     GEN: Patient is in no acute distress HEENT: Normal NECK: No JVD; No carotid bruits LYMPHATICS: No lymphadenopathy CARDIAC: Hear sounds regular, 2/6 systolic murmur at the apex. RESPIRATORY:  Clear to auscultation without rales, wheezing or rhonchi  ABDOMEN: Soft, non-tender, non-distended MUSCULOSKELETAL:  No edema; No deformity  SKIN: Warm and dry NEUROLOGIC:  Alert and oriented x 3 PSYCHIATRIC:  Normal affect   Signed, Garwin Brothers, MD  04/07/2023 9:35 AM    [facility]

## 2023-04-07 NOTE — Progress Notes (Signed)
Cardiology Office Note:    Date:  04/07/2023   ID:  [name], DOB 28-Aug-1951, MRN [id number]  PCP:  [name], PA  Cardiologist:  Garwin Brothers, MD   Referring MD: [name], PA    ASSESSMENT:    1. Aortic atherosclerosis (HCC)   2. Coronary artery calcification   3. Essential hypertension   4. Panlobular emphysema (HCC)   5. Pure hypercholesterolemia   6. Atrial fibrillation, unspecified type (HCC)   7. DOE (dyspnea on exertion)    PLAN:    In order of problems listed above:  Coronary artery calcification: Aortic atherosclerosis: Dyspnea on exertion: Patient's symptoms are very concerning.  His ejection fraction was moderately depressed on stress test.  There was no evidence of ischemia.  He has coronary artery calcification.  Therefore following recommendations were made.I discussed coronary angiography and left heart catheterization with the patient at extensive length. Procedure, benefits and potential risks were explained. Patient had multiple questions which were answered to the patient's satisfaction. Patient agreed and consented for the procedure. Further recommendations will be made based on the findings of the coronary angiography. In the interim. The patient has any significant symptoms he knows to go to the nearest emergency room. Persistent atrial fibrillation:I discussed with the patient atrial fibrillation, disease process. Management and therapy including rate and rhythm control, anticoagulation benefits and potential risks were discussed extensively with the patient. Patient had multiple questions which were answered to patient's satisfaction. Mixed dyslipidemia: Diet was emphasized.  Patient is on lipid-lowering therapy. Further recommendations will depend on the findings of the coronary angiography.  Patient and wife had multiple questions which were answered to their satisfaction.   Medication Adjustments/Labs and Tests Ordered: Current  medicines are reviewed at length with the patient today.  Concerns regarding medicines are outlined above.  Orders Placed This Encounter  Procedures   EKG 12-Lead   No orders of the defined types were placed in this encounter.    No chief complaint on file.    History of Present Illness:    [name] is a 72 y.o. male.  Patient has past medical history of coronary artery calcification, aortic atherosclerosis, persistent atrial fibrillation, essential hypertension and mixed dyslipidemia.  Patient is being evaluated for lung cancer however patient and wife mentioned to me that now they have received the final recommendation from the doctors and there is no history of cancer at this time.  Patient underwent stress test for dyspnea on exertion.  This did not reveal any evidence of ischemia but ejection fraction was depressed.  Echocardiogram EF was unremarkable.  Patient tells me that he quit smoking in 2003 but his dyspnea on exertion is getting worse.  No chest pain.  At the time of my evaluation, the patient is alert awake oriented and in no distress.  Past Medical History:  Diagnosis Date   Adjustment disorder with other symptom 11/27/2015   AKI (acute kidney injury) (HCC) 01/14/2016   Aortic atherosclerosis (HCC) 01/26/2023   Arthritis    Atrial fibrillation (HCC) 10/26/2011   BPH (benign prostatic hyperplasia) 2011   BRONCHOPNEUMONIA ORGANISM UNSPECIFIED 06/12/2010         Replacing diagnoses that were inactivated after the 12/21/22 regulatory import   Carpal tunnel syndrome, right upper limb 11/03/2022   Chronic respiratory failure with hypoxia (HCC) 08/27/2015   Congestive heart failure (HCC)    COPD (chronic obstructive pulmonary disease) (HCC)    COPD, severe (HCC) 11/27/2015  12/18/2015-pulmonary function test- FVC 2.75 (55% predicted), postbronchodilator ratio 56, postbronchodilator FEV1 1.66 (45% predicted), no significant bronchodilator response, mid flow reversibility,  DLCO 33  >>>Severe obstructive airways disease, Severe diffusion defect      Coronary artery calcification 01/26/2023   Depression    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 [facility])    Drug-induced neutropenia (HCC) 10/08/2015   Encounter for antineoplastic chemotherapy 10/08/2015   Essential hypertension 06/05/2008   Qualifier: Diagnosis of   By: Truman Hayward Duncan Dull), [name]       Gastrointestinal obstruction (HCC)    Ileus   GERD (gastroesophageal reflux disease)    Gout attack 08/29/2012   Hx of colonoscopy    Hypothyroidism 03/30/2012   Lesion of ulnar nerve, right upper limb 11/03/2022   Lung cancer (HCC) 2011   Lymphadenopathy syndrome 09/12/2015   Mediastinal adenopathy 09/12/2015   NHL (non-Hodgkin's lymphoma) (HCC) 09/25/2015   Numbness of right hand 06/28/2022   Obesity    Open wound of right hand without foreign body 07/27/2021   Other dysphagia 11/11/2010   Qualifier: Diagnosis of   By: Marchelle Gearing MD, [name]       Polycythemia 01/26/2023   POLYCYTHEMIA 06/05/2008   Qualifier: Diagnosis of   By: Waldo Laine), [name]       Port catheter in place 08/20/2016   Post-nasal drip 10/04/2011   Primary cancer of right upper lobe of lung (HCC) 10/25/2019   Squamous cell   Pulmonary hypertension (HCC)    Pulmonary nodule, right 10/03/2019   Pure hypercholesterolemia 12/03/2011   Recurrent pneumonia 02/10/2012   Routine general medical examination at a health care facility 12/03/2011   Sepsis (HCC) 01/14/2016   Severe sepsis (HCC) 01/14/2016   Squamous cell carcinoma lung (HCC) 09/26/2010   S/p LUL resection Dr [name] dec 2011      Status post bronchoscopy with biopsy    Stiffness of finger joint of right hand 09/24/2021   Streptococcal pneumonia (HCC) 04/2008   Testicular swelling 09/12/2015    Past Surgical History:  Procedure Laterality Date   APPENDECTOMY     BACK SURGERY     CARDIOVERSION  11/27/2011   Procedure: CARDIOVERSION;  Surgeon:  Vesta Mixer, MD;  Location: [facility] ENDOSCOPY;  Service: Cardiovascular;  Laterality: N/A;   COLONOSCOPY     FUDUCIAL PLACEMENT Right 10/13/2019   Procedure: Placement Of Fuducial;  Surgeon: Leslye Peer, MD;  Location: [facility] OR;  Service: Thoracic;  Laterality: Right;  Right Upper Lobe    LUNG CANCER SURGERY  2011   UPPER GI ENDOSCOPY     VIDEO BRONCHOSCOPY WITH ENDOBRONCHIAL NAVIGATION N/A 10/13/2019   Procedure: VIDEO BRONCHOSCOPY WITH ENDOBRONCHIAL NAVIGATION;  Surgeon: Leslye Peer, MD;  Location: [facility] OR;  Service: Thoracic;  Laterality: N/A;   VIDEO BRONCHOSCOPY WITH ENDOBRONCHIAL ULTRASOUND N/A 10/13/2019   Procedure: VIDEO BRONCHOSCOPY WITH ENDOBRONCHIAL ULTRASOUND;  Surgeon: Leslye Peer, MD;  Location: [facility] OR;  Service: Thoracic;  Laterality: N/A;    Current Medications: Current Meds  Medication Sig   albuterol (PROVENTIL) (2.5 MG/3ML) 0.083% nebulizer solution Take 3 mLs (2.5 mg total) by nebulization every 4 (four) hours as needed for wheezing or shortness of breath.   allopurinol (ZYLOPRIM) 300 MG tablet Take 300 mg by mouth daily.   amLODipine (NORVASC) 5 MG tablet Take 5 mg by mouth daily.   apixaban (ELIQUIS) 5 MG TABS tablet Take 1 tablet (5 mg total) by mouth 2 (two) times daily.   Buprenorphine HCl-Naloxone HCl 8-2  MG FILM Place 1 Film under the tongue 2 (two) times daily.   cetirizine (ZYRTEC) 10 MG tablet Take 10 mg by mouth daily.   ezetimibe (ZETIA) 10 MG tablet Take 10 mg by mouth daily.   ferrous sulfate 325 (65 FE) MG EC tablet Take 1 tablet by mouth 2 (two) times daily.   finasteride (PROSCAR) 5 MG tablet Take 5 mg by mouth daily.   levothyroxine (SYNTHROID) 175 MCG tablet Take 175 mcg by mouth daily.   LINZESS 290 MCG CAPS capsule Take 290 mcg by mouth daily.   lisinopril (PRINIVIL,ZESTRIL) 10 MG tablet Take 1 tablet (10 mg total) by mouth daily.   metoprolol succinate (TOPROL XL) 25 MG 24 hr tablet Take 1 tablet (25 mg total) by mouth daily.   omeprazole  (PRILOSEC) 20 MG capsule Take 1 capsule (20 mg total) by mouth 2 (two) times daily.   PROAIR HFA 108 (90 Base) MCG/ACT inhaler Inhale 2 puffs into the lungs every 6 (six) hours as needed for wheezing or shortness of breath.   sodium chloride (OCEAN) 0.65 % SOLN nasal spray Place 1 spray into both nostrils daily as needed for congestion.   TRELEGY ELLIPTA 100-62.5-25 MCG/INH AEPB Inhale 1 puff into the lungs daily.   Vitamin D, Ergocalciferol, (DRISDOL) 1.25 MG (50000 UNIT) CAPS capsule Take 50,000 Units by mouth once a week.   zolpidem (AMBIEN) 10 MG tablet Take 10 mg by mouth at bedtime.     Allergies:   Omnipaque [iohexol]   Social History   Socioeconomic History   Marital status: Married    Spouse name: Not on file   Number of children: Not on file   Years of education: Not on file   Highest education level: Not on file  Occupational History   Occupation: truck driver  Tobacco Use   Smoking status: Former    Current packs/day: 0.00    Average packs/day: 3.0 packs/day for 43.0 years (129.0 ttl pk-yrs)    Types: Cigarettes    Start date: 10/22/1958    Quit date: 10/22/2001    Years since quitting: 21.4   Smokeless tobacco: Never  Vaping Use   Vaping status: Never Used  Substance and Sexual Activity   Alcohol use: No   Drug use: No   Sexual activity: Yes  Other Topics Concern   Not on file  Social History Narrative   Resides in [place] with his wife.  Has 1 daughter and 1 granddaughter.   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   Social Determinants of Health   Financial Resource Strain: Not on file  Food Insecurity: Not on file  Transportation Needs: Not on file  Physical Activity: Not on file  Stress: Not on file  Social Connections: Not on file     Family History: The patient's family history includes Heart disease in his father. There is no history of Cancer, Diabetes, Stroke, Hypertension, or Kidney disease.  ROS:   Please see  the history of present illness.    All other systems reviewed and are negative.  EKGs/Labs/Other Studies Reviewed:    The following studies were reviewed today: EKG reveals atrial fibrillation with well-controlled ventricular rate.   Recent Labs: 01/23/2023: B Natriuretic Peptide 169.8 02/12/2023: Hemoglobin 12.8; Platelets 211; TSH 3.042 02/14/2023: ALT 19; BUN 26; Creatinine, Ser 1.07; Magnesium 2.0; Potassium 4.5; Sodium 137  Recent Lipid Panel    Component Value Date/Time   CHOL 282 (H) 12/03/2011 1024  TRIG 85.0 12/03/2011 1024   HDL 54.60 12/03/2011 1024   CHOLHDL 5 12/03/2011 1024   VLDL 17.0 12/03/2011 1024   LDLDIRECT 205.4 12/03/2011 1024    Physical Exam:    VS:  BP 130/70   Pulse 67   Ht 6' (1.829 m)   Wt 213 lb 6.4 oz (96.8 kg)   SpO2 95%   BMI 28.94 kg/m     Wt Readings from Last 3 Encounters:  04/07/23 213 lb 6.4 oz (96.8 kg)  02/17/23 206 lb 1.6 oz (93.5 kg)  02/17/23 208 lb (94.3 kg)     GEN: Patient is in no acute distress HEENT: Normal NECK: No JVD; No carotid bruits LYMPHATICS: No lymphadenopathy CARDIAC: Hear sounds regular, 2/6 systolic murmur at the apex. RESPIRATORY:  Clear to auscultation without rales, wheezing or rhonchi  ABDOMEN: Soft, non-tender, non-distended MUSCULOSKELETAL:  No edema; No deformity  SKIN: Warm and dry NEUROLOGIC:  Alert and oriented x 3 PSYCHIATRIC:  Normal affect   Signed, Garwin Brothers, MD  04/07/2023 9:35 AM    [facility]

## 2023-04-07 NOTE — Patient Instructions (Signed)
Medication Instructions:  Your physician has recommended you make the following change in your medication:   Take 81 mg coated aspirin daily. Use nitroglycerin as needed for chest pain.  *If you need a refill on your cardiac medications before your next appointment, please call your pharmacy*   Lab Work: Your physician recommends that you have a BMET and CBC today in the office for your upcoming procedure.  If you have labs (blood work) drawn today and your tests are completely normal, you will receive your results only by: MyChart Message (if you have MyChart) OR A paper copy in the mail If you have any lab test that is abnormal or we need to change your treatment, we will call you to review the results.   Testing/Procedures:  [place] [facility] DEPT OF [facility] AT [place] [address] [place] [address] Dept: [phone] Loc: [phone]  [name]  04/07/2023  You are scheduled for a Cardiac Catheterization on Monday, July 22 with Dr. Bryan Lemma.  1. Please arrive at the [facility] (Main Entrance A) at [facility]: [address] [place], [place] [address] at 7:00 AM (This time is 2 hour(s) before your procedure to ensure your preparation). Free valet parking service is available. You will check in at ADMITTING. The support person will be asked to wait in the waiting room.  It is OK to have someone drop you off and come back when you are ready to be discharged.    Special note: Every effort is made to have your procedure done on time. Please understand that emergencies sometimes delay scheduled procedures.  2. Diet: Do not eat solid foods after midnight.  The patient may have clear liquids until 5am upon the day of the procedure.  3. Labs: You had labs done in the office.  4. Medication instructions in preparation for your procedure:   Contrast Allergy: Yes, Please take Prednisone 50mg  by mouth  at: Thirteen hours prior to cath 8:00pm on Sunday Seven hours prior to cath 2:00am on Monday And prior to leaving home please take last dose of Prednisone 50mg  and Benadryl 50mg  by mouth. Stop Eliquis (Apixaban) Saturday, July 20 Stop taking, Lisinopril (Zestril or Prinivil) Monday, July 22,  On the morning of your procedure, take your Aspirin 81 mg and any morning medicines NOT listed above.  You may use sips of water.  5. Plan to go home the same day, you will only stay overnight if medically necessary. 6. Bring a current list of your medications and current insurance cards. 7. You MUST have a responsible person to drive you home. 8. Someone MUST be with you the first 24 hours after you arrive home or your discharge will be delayed. 9. Please wear clothes that are easy to get on and off and wear slip-on shoes.  Thank you for allowing [place] to care for you!   -- [facility]  Follow-Up: At [facility], you and your health needs are our priority.  As part of our continuing mission to provide you with exceptional heart care, we have created designated Provider Care Teams.  These Care Teams include your primary Cardiologist (physician) and Advanced Practice Providers (APPs -  Physician Assistants and Nurse Practitioners) who all work together to provide you with the care you need, when you need it.  We recommend signing up for the patient portal called "MyChart".  Sign up information is provided  on this After Visit Summary.  MyChart is used to connect with patients for Virtual Visits (Telemedicine).  Patients are able to view lab/test results, encounter notes, upcoming appointments, etc.  Non-urgent messages can be sent to your provider as well.   To learn more about what you can do with MyChart, go to [email or web address].    Your next appointment:   9 month(s)  The format for your next appointment:   In Person  Provider:   Belva Crome,  MD   Other Instructions  Coronary Angiogram With Stent Coronary angiogram with stent placement is a procedure to widen or open a narrow blood vessel of the heart (coronary artery). Arteries may become blocked by cholesterol buildup (plaques) in the lining of the artery wall. When a coronary artery becomes partially blocked, blood flow to that area decreases. This may lead to chest pain or a heart attack (myocardial infarction). A stent is a small piece of metal that looks like mesh or spring. Stent placement may be done as treatment after a heart attack, or to prevent a heart attack if a blocked artery is found by a coronary angiogram. Let your health care provider know about: Any allergies you have, including allergies to medicines or contrast dye. All medicines you are taking, including vitamins, herbs, eye drops, creams, and over-the-counter medicines. Any problems you or family members have had with anesthetic medicines. Any blood disorders you have. Any surgeries you have had. Any medical conditions you have, including kidney problems or kidney failure. Whether you are pregnant or may be pregnant. Whether you are breastfeeding. What are the risks? Generally, this is a safe procedure. However, serious problems may occur, including: Damage to nearby structures or organs, such as the heart, blood vessels, or kidneys. A return of blockage. Bleeding, infection, or bruising at the insertion site. A collection of blood under the skin (hematoma) at the insertion site. A blood clot in another part of the body. Allergic reaction to medicines or dyes. Bleeding into the abdomen (retroperitoneal bleeding). Stroke (rare). Heart attack (rare). What happens before the procedure? Staying hydrated Follow instructions from your health care provider about hydration, which may include: Up to 2 hours before the procedure - you may continue to drink clear liquids, such as water, clear fruit juice, black  coffee, and plain tea.    Eating and drinking restrictions Follow instructions from your health care provider about eating and drinking, which may include: 8 hours before the procedure - stop eating heavy meals or foods, such as meat, fried foods, or fatty foods. 6 hours before the procedure - stop eating light meals or foods, such as toast or cereal. 2 hours before the procedure - stop drinking clear liquids. Medicines Ask your health care provider about: Changing or stopping your regular medicines. This is especially important if you are taking diabetes medicines or blood thinners. Taking medicines such as aspirin and ibuprofen. These medicines can thin your blood. Do not take these medicines unless your health care provider tells you to take them. Generally, aspirin is recommended before a thin tube, called a catheter, is passed through a blood vessel and inserted into the heart (cardiac catheterization). Taking over-the-counter medicines, vitamins, herbs, and supplements. General instructions Do not use any products that contain nicotine or tobacco for at least 4 weeks before the procedure. These products include cigarettes, e-cigarettes, and chewing tobacco. If you need help quitting, ask your health care provider. Plan to have someone take you home from the hospital or  clinic. If you will be going home right after the procedure, plan to have someone with you for 24 hours. You may have tests and imaging procedures. Ask your health care provider: How your insertion site will be marked. Ask which artery will be used for the procedure. What steps will be taken to help prevent infection. These may include: Removing hair at the insertion site. Washing skin with a germ-killing soap. Taking antibiotic medicine. What happens during the procedure? An IV will be inserted into one of your veins. Electrodes may be placed on your chest to monitor your heart rate during the procedure. You will be  given one or more of the following: A medicine to help you relax (sedative). A medicine to numb the area (local anesthetic) for catheter insertion. A small incision will be made for catheter insertion. The catheter will be inserted into an artery using a guide wire. The location may be in your groin, your wrist, or the fold of your arm (near your elbow). An X-ray procedure (fluoroscopy) will be used to help guide the catheter to the opening of the heart arteries. A dye will be injected into the catheter. X-rays will be taken. The dye helps to show where any narrowing or blockages are located in the arteries. Tell your health care provider if you have chest pain or trouble breathing. A tiny wire will be guided to the blocked spot, and a balloon will be inflated to make the artery wider. The stent will be expanded to crush the plaques into the wall of the vessel. The stent will hold the area open and improve the blood flow. Most stents have a drug coating to reduce the risk of the stent narrowing over time. The artery may be made wider using a drill, laser, or other tools that remove plaques. The catheter will be removed when the blood flow improves. The stent will stay where it was placed, and the lining of the artery will grow over it. A bandage (dressing) will be placed on the insertion site. Pressure will be applied to stop bleeding. The IV will be removed. This procedure may vary among health care providers and hospitals.    What happens after the procedure? Your blood pressure, heart rate, breathing rate, and blood oxygen level will be monitored until you leave the hospital or clinic. If the procedure is done through the leg, you will lie flat in bed for a few hours or for as long as told by your health care provider. You will be instructed not to bend or cross your legs. The insertion site and the pulse in your foot or wrist will be checked often. You may have more blood tests, X-rays, and a  test that records the electrical activity of your heart (electrocardiogram, or ECG). Do not drive for 24 hours if you were given a sedative during your procedure. Summary Coronary angiogram with stent placement is a procedure to widen or open a narrowed coronary artery. This is done to treat heart problems. Before the procedure, let your health care provider know about all the medical conditions and surgeries you have or have had. This is a safe procedure. However, some problems may occur, including damage to nearby structures or organs, bleeding, blood clots, or allergies. Follow your health care provider's instructions about eating, drinking, medicines, and other lifestyle changes, such as quitting tobacco use before the procedure. This information is not intended to replace advice given to you by your health care provider. Make sure  you discuss any questions you have with your health care provider. Document Revised: 03/29/2019 Document Reviewed: 03/29/2019 Elsevier Patient Education  2021 Elsevier Inc.  Aspirin and Your Heart Aspirin is a medicine that prevents the platelets in your blood from sticking together. Platelets are the cells that your blood uses for clotting. Aspirin can be used to help reduce the risk of blood clots, heart attacks, and other heart-related problems. What are the risks? Daily use of aspirin can cause side effects. Some of these include: Bleeding. Bleeding can be minor or serious. An example of minor bleeding is bleeding from a cut, and the bleeding does not stop. An example of more serious bleeding is stomach bleeding or, rarely, bleeding into the brain. Your risk of bleeding increases if you are also taking NSAIDs, such as ibuprofen. Increased bruising. Upset stomach. An allergic reaction. People who have growths inside the nose (nasal polyps) have an increased risk of developing an aspirin allergy. How to use aspirin to care for your heart Take aspirin only as  told by your health care provider. Make sure that you understand how much to take and what form to take. The two forms of aspirin are: Non-enteric-coated.This type of aspirin does not have a coating and is absorbed quickly. This type of aspirin also comes in a chewable form. Enteric-coated. This type of aspirin has a coating that releases the medicine very slowly. Enteric-coated aspirin might cause less stomach upset than non-enteric-coated aspirin. This type of aspirin should not be chewed or crushed. Work with your health care provider to find out whether it is safe and beneficial for you to take aspirin daily. Taking aspirin daily may be helpful if: You have had a heart attack or chest pain, or you are at risk for a heart attack. You have a condition in which certain heart vessels are blocked (coronary artery disease), and you have had a procedure to treat it. Examples are: Open-heart surgery, such as coronary artery bypass surgery (CABG). Coronary angioplasty,which is done to widen a blood vessel of your heart. Having a small mesh tube, or stent, placed in your coronary artery. You have had certain types of stroke or a mini-stroke known as a transient ischemic attack (TIA). You have a narrowing of the arteries that supply the limbs (peripheral artery disease, or PAD). You have long-term (chronic) heart rhythm problems, such as atrial fibrillation, and your health care provider thinks aspirin may help. You have valve disease or have had surgery on a valve. You are considered at increased risk of developing coronary artery disease or PAD.    Follow these instructions at home Medicines Take over-the-counter and prescription medicines only as told by your health care provider. If you are taking blood thinners: Talk with your health care provider before you take any medicines that contain aspirin or NSAIDs, such as ibuprofen. These medicines increase your risk for dangerous bleeding. Take your  medicine exactly as told, at the same time every day. Avoid activities that could cause injury or bruising, and follow instructions about how to prevent falls. Wear a medical alert bracelet or carry a card that lists what medicines you take. General instructions Do not drink alcohol if: Your health care provider tells you not to drink. You are pregnant, may be pregnant, or are planning to become pregnant. If you drink alcohol: Limit how much you use to: 0-1 drink a day for women. 0-2 drinks a day for men. Be aware of how much alcohol is in your  drink. In the [place] one drink equals one 12 oz bottle of beer (355 mL), one 5 oz glass of wine (148 mL), or one 1 oz glass of hard liquor (44 mL). Keep all follow-up visits as told by your health care provider. This is important. Where to find more information The American Heart Association: www.heart.org Contact a health care provider if you have: Unusual bleeding or bruising. Stomach pain or nausea. Ringing in your ears. An allergic reaction that causes hives, itchy skin, or swelling of the lips, tongue, or face. Get help right away if: You notice that your bowel movements are bloody, or dark red or black in color. You vomit or cough up blood. You have blood in your urine. You cough, breathe loudly (wheeze), or feel short of breath. You have chest pain, especially if the pain spreads to your arms, back, neck, or jaw. You have a headache with confusion. You have any symptoms of a stroke. "BE FAST" is an easy way to remember the main warning signs of a stroke: B - Balance. Signs are dizziness, sudden trouble walking, or loss of balance. E - Eyes. Signs are trouble seeing or a sudden change in vision. F - Face. Signs are sudden weakness or numbness of the face, or the face or eyelid drooping on one side. A - Arms. Signs are weakness or numbness in an arm. This happens suddenly and usually on one side of the body. S - Speech. Signs are sudden  trouble speaking, slurred speech, or trouble understanding what people say. T - Time. Time to call emergency services. Write down what time symptoms started. You have other signs of a stroke, such as: A sudden, severe headache with no known cause. Nausea or vomiting. Seizure. These symptoms may represent a serious problem that is an emergency. Do not wait to see if the symptoms will go away. Get medical help right away. Call your local emergency services (911 in the [place] Do not drive yourself to the hospital. Summary Aspirin use can help reduce the risk of blood clots, heart attacks, and other heart-related problems. Daily use of aspirin can cause side effects. Take aspirin only as told by your health care provider. Make sure that you understand how much to take and what form to take. Your health care provider will help you determine whether it is safe and beneficial for you to take aspirin daily. This information is not intended to replace advice given to you by your health care provider. Make sure you discuss any questions you have with your health care provider. Document Revised: 06/12/2019 Document Reviewed: 06/12/2019 Elsevier Patient Education  2021 Elsevier Inc. Nitroglycerin sublingual tablets What is this medicine? NITROGLYCERIN (nye troe GLI ser in) is a type of vasodilator. It relaxes blood vessels, increasing the blood and oxygen supply to your heart. This medicine is used to relieve chest pain caused by angina. It is also used to prevent chest pain before activities like climbing stairs, going outdoors in cold weather, or sexual activity. This medicine may be used for other purposes; ask your health care provider or pharmacist if you have questions. COMMON BRAND NAME(S): Nitroquick, Nitrostat, Nitrotab What should I tell my health care provider before I take this medicine? They need to know if you have any of these conditions: anemia head injury, recent stroke, or bleeding in  the brain liver disease previous heart attack an unusual or allergic reaction to nitroglycerin, other medicines, foods, dyes, or preservatives pregnant or trying to get  pregnant breast-feeding How should I use this medicine? Take this medicine by mouth as needed. Use at the first sign of an angina attack (chest pain or tightness). You can also take this medicine 5 to 10 minutes before an event likely to produce chest pain. Follow the directions exactly as written on the prescription label. Place one tablet under your tongue and let it dissolve. Do not swallow whole. Replace the dose if you accidentally swallow it. It will help if your mouth is not dry. Saliva around the tablet will help it to dissolve more quickly. Do not eat or drink, smoke or chew tobacco while a tablet is dissolving. Sit down when taking this medicine. In an angina attack, you should feel better within 5 minutes after your first dose. You can take a dose every 5 minutes up to a total of 3 doses. If you do not feel better or feel worse after 1 dose, call 9-1-1 at once. Do not take more than 3 doses in 15 minutes. Your health care provider might give you other directions. Follow those directions if he or she does. Do not take your medicine more often than directed. Talk to your health care provider about the use of this medicine in children. Special care may be needed. Overdosage: If you think you have taken too much of this medicine contact a poison control center or emergency room at once. NOTE: This medicine is only for you. Do not share this medicine with others. What if I miss a dose? This does not apply. This medicine is only used as needed. What may interact with this medicine? Do not take this medicine with any of the following medications: certain migraine medicines like ergotamine and dihydroergotamine (DHE) medicines used to treat erectile dysfunction like sildenafil, tadalafil, and vardenafil riociguat This medicine  may also interact with the following medications: alteplase aspirin heparin medicines for high blood pressure medicines for mental depression other medicines used to treat angina phenothiazines like chlorpromazine, mesoridazine, prochlorperazine, thioridazine This list may not describe all possible interactions. Give your health care provider a list of all the medicines, herbs, non-prescription drugs, or dietary supplements you use. Also tell them if you smoke, drink alcohol, or use illegal drugs. Some items may interact with your medicine. What should I watch for while using this medicine? Tell your doctor or health care professional if you feel your medicine is no longer working. Keep this medicine with you at all times. Sit or lie down when you take your medicine to prevent falling if you feel dizzy or faint after using it. Try to remain calm. This will help you to feel better faster. If you feel dizzy, take several deep breaths and lie down with your feet propped up, or bend forward with your head resting between your knees. You may get drowsy or dizzy. Do not drive, use machinery, or do anything that needs mental alertness until you know how this drug affects you. Do not stand or sit up quickly, especially if you are an older patient. This reduces the risk of dizzy or fainting spells. Alcohol can make you more drowsy and dizzy. Avoid alcoholic drinks. Do not treat yourself for coughs, colds, or pain while you are taking this medicine without asking your doctor or health care professional for advice. Some ingredients may increase your blood pressure. What side effects may I notice from receiving this medicine? Side effects that you should report to your doctor or health care professional as soon as possible:  allergic reactions (skin rash, itching or hives; swelling of the face, lips, or tongue) low blood pressure (dizziness; feeling faint or lightheaded, falls; unusually weak or tired) low red  blood cell counts (trouble breathing; feeling faint; lightheaded, falls; unusually weak or tired) Side effects that usually do not require medical attention (report to your doctor or health care professional if they continue or are bothersome): facial flushing (redness) headache nausea, vomiting This list may not describe all possible side effects. Call your doctor for medical advice about side effects. You may report side effects to FDA at 1-800-FDA-1088. Where should I keep my medicine? Keep out of the reach of children. Store at room temperature between 20 and 25 degrees C (68 and 77 degrees F). Store in Retail buyer. Protect from light and moisture. Keep tightly closed. Throw away any unused medicine after the expiration date. NOTE: This sheet is a summary. It may not cover all possible information. If you have questions about this medicine, talk to your doctor, pharmacist, or health care provider.  2021 Elsevier/Gold Standard (2018-06-08 16:46:32)

## 2023-04-08 ENCOUNTER — Telehealth: Payer: Self-pay | Admitting: Cardiology

## 2023-04-08 ENCOUNTER — Telehealth: Payer: Self-pay

## 2023-04-08 ENCOUNTER — Telehealth: Payer: Self-pay | Admitting: *Deleted

## 2023-04-08 DIAGNOSIS — E875 Hyperkalemia: Secondary | ICD-10-CM

## 2023-04-08 LAB — LIPID PANEL
Chol/HDL Ratio: 4.2 ratio (ref 0.0–5.0)
Cholesterol, Total: 169 mg/dL (ref 100–199)
HDL: 40 mg/dL (ref >39)
LDL Chol Calc (NIH): 116 mg/dL — ABNORMAL HIGH (ref 0–99)
Triglycerides: 65 mg/dL (ref 0–149)
VLDL Cholesterol Cal: 13 mg/dL (ref 5–40)

## 2023-04-08 LAB — CBC
Hematocrit: 33.3 % — ABNORMAL LOW (ref 37.5–51.0)
Hemoglobin: 11 g/dL — ABNORMAL LOW (ref 13.0–17.7)
MCH: 29.6 pg (ref 26.6–33.0)
MCHC: 33 g/dL (ref 31.5–35.7)
MCV: 90 fL (ref 79–97)
Platelets: 229 10*3/uL (ref 150–450)
RBC: 3.72 x10E6/uL — ABNORMAL LOW (ref 4.14–5.80)
RDW: 13 % (ref 11.6–15.4)
WBC: 7.9 10*3/uL (ref 3.4–10.8)

## 2023-04-08 LAB — COMPREHENSIVE METABOLIC PANEL
ALT: 17 IU/L (ref 0–44)
AST: 19 IU/L (ref 0–40)
Albumin: 4.2 g/dL (ref 3.8–4.8)
Alkaline Phosphatase: 121 IU/L (ref 44–121)
BUN/Creatinine Ratio: 22 (ref 10–24)
BUN: 25 mg/dL (ref 8–27)
Bilirubin Total: 0.5 mg/dL (ref 0.0–1.2)
CO2: 26 mmol/L (ref 20–29)
Calcium: 9.3 mg/dL (ref 8.6–10.2)
Chloride: 103 mmol/L (ref 96–106)
Creatinine, Ser: 1.15 mg/dL (ref 0.76–1.27)
Globulin, Total: 2.1 g/dL (ref 1.5–4.5)
Glucose: 108 mg/dL — ABNORMAL HIGH (ref 70–99)
Potassium: 5.8 mmol/L (ref 3.5–5.2)
Sodium: 139 mmol/L (ref 134–144)
Total Protein: 6.3 g/dL (ref 6.0–8.5)
eGFR: 68 mL/min/{1.73_m2} (ref >59)

## 2023-04-08 MED ORDER — LOKELMA 10 G PO PACK: 10.0000 g | PACK | Freq: Two times a day (BID) | ORAL | Status: DC

## 2023-04-08 NOTE — Telephone Encounter (Signed)
[facility] reporting potassium 5.8. Pt has already been made aware and medications given.

## 2023-04-08 NOTE — Telephone Encounter (Signed)
[facility] calling to give critical results.

## 2023-04-08 NOTE — Telephone Encounter (Signed)
Cardiac Catheterization scheduled at [facility] for: Monday March 30, 2023 9 AM Arrival time  [facility] Main Entrance A at: 7 AM  Nothing to eat after midnight prior to procedure, clear liquids until 5 AM day of procedure.  CONTRAST ALLERGY: yes- 13 hour Prednisone and Benadryl prep reviewed with patient: 04/11/23 Prednisone 50 mg 8 PM 04/12/23 Prednisone 50 mg 2 AM 04/12/23 Prednisone 50 mg and Benadryl 50 mg just prior to leaving home morning of procedure Pt advised not to drive after taking Benadryl.  Medication instructions: -Hold:  Eliquis-none 04/10/23 until post procedure -Other usual  morning medications can be taken with sips of water including aspirin 81 mg.  Confirmed patient has responsible adult to drive home post procedure and be with patient first 24 hours after arriving home.  Plan to go home the same day, you will only stay overnight if medically necessary.  Reviewed procedure instructions with patient.

## 2023-04-08 NOTE — Telephone Encounter (Signed)
Pt aware of results. Samples of Lokelma given. Pt will use 2 doses today and return on 04/09/23 for stat potassium level. Pt verbalized understanding and had no additional questions.

## 2023-04-09 ENCOUNTER — Telehealth: Payer: Self-pay | Admitting: Cardiology

## 2023-04-09 LAB — BASIC METABOLIC PANEL
BUN/Creatinine Ratio: 19 (ref 10–24)
BUN: 23 mg/dL (ref 8–27)
CO2: 28 mmol/L (ref 20–29)
Calcium: 8.9 mg/dL (ref 8.6–10.2)
Chloride: 104 mmol/L (ref 96–106)
Creatinine, Ser: 1.21 mg/dL (ref 0.76–1.27)
Glucose: 112 mg/dL — ABNORMAL HIGH (ref 70–99)
Potassium: 4.5 mmol/L (ref 3.5–5.2)
Sodium: 140 mmol/L (ref 134–144)
eGFR: 64 mL/min/{1.73_m2} (ref >59)

## 2023-04-09 NOTE — Telephone Encounter (Signed)
Pt calling for lab results. Informed him we are still waiting for Dr. Tomie China to review but nurse will call.

## 2023-04-09 NOTE — Telephone Encounter (Signed)
Spoke to patient and informed him that the provider had not yet reviewed all of his labs however his potassium level was back within the normal range for the patient at 4.5. Patient was satisfied with response.

## 2023-04-12 ENCOUNTER — Ambulatory Visit (HOSPITAL_COMMUNITY)
Admission: RE | Admit: 2023-04-12 | Discharge: 2023-04-12 | Disposition: A | Discharge status: Home / Self Care | Payer: 59 | Attending: Cardiology | Admitting: Cardiology

## 2023-04-12 ENCOUNTER — Other Ambulatory Visit: Payer: Self-pay

## 2023-04-12 ENCOUNTER — Encounter (HOSPITAL_COMMUNITY)
Admission: RE | Disposition: A | Discharge status: Home / Self Care | Payer: Self-pay | Source: Home / Self Care | Attending: Cardiology

## 2023-04-12 DIAGNOSIS — Z87891 Personal history of nicotine dependence: Secondary | ICD-10-CM | POA: Insufficient documentation

## 2023-04-12 DIAGNOSIS — I4891 Unspecified atrial fibrillation: Secondary | ICD-10-CM

## 2023-04-12 DIAGNOSIS — R0609 Other forms of dyspnea: Secondary | ICD-10-CM

## 2023-04-12 DIAGNOSIS — I2510 Atherosclerotic heart disease of native coronary artery without angina pectoris: Secondary | ICD-10-CM | POA: Diagnosis not present

## 2023-04-12 DIAGNOSIS — I4819 Other persistent atrial fibrillation: Secondary | ICD-10-CM | POA: Insufficient documentation

## 2023-04-12 DIAGNOSIS — Z85118 Personal history of other malignant neoplasm of bronchus and lung: Secondary | ICD-10-CM | POA: Diagnosis not present

## 2023-04-12 DIAGNOSIS — R931 Abnormal findings on diagnostic imaging of heart and coronary circulation: Secondary | ICD-10-CM

## 2023-04-12 DIAGNOSIS — J431 Panlobular emphysema: Secondary | ICD-10-CM | POA: Diagnosis not present

## 2023-04-12 DIAGNOSIS — E7800 Pure hypercholesterolemia, unspecified: Secondary | ICD-10-CM

## 2023-04-12 DIAGNOSIS — Z7901 Long term (current) use of anticoagulants: Secondary | ICD-10-CM | POA: Diagnosis not present

## 2023-04-12 DIAGNOSIS — I110 Hypertensive heart disease with heart failure: Secondary | ICD-10-CM | POA: Insufficient documentation

## 2023-04-12 DIAGNOSIS — I10 Essential (primary) hypertension: Secondary | ICD-10-CM

## 2023-04-12 DIAGNOSIS — I700 Atherosclerosis of aorta: Secondary | ICD-10-CM | POA: Diagnosis not present

## 2023-04-12 DIAGNOSIS — E782 Mixed hyperlipidemia: Secondary | ICD-10-CM | POA: Diagnosis not present

## 2023-04-12 DIAGNOSIS — I5032 Chronic diastolic (congestive) heart failure: Secondary | ICD-10-CM | POA: Insufficient documentation

## 2023-04-12 DIAGNOSIS — Z79899 Other long term (current) drug therapy: Secondary | ICD-10-CM | POA: Diagnosis not present

## 2023-04-12 HISTORY — PX: LEFT HEART CATH AND CORONARY ANGIOGRAPHY: CATH118249

## 2023-04-12 SURGERY — LEFT HEART CATH AND CORONARY ANGIOGRAPHY
Anesthesia: LOCAL

## 2023-04-12 MED ORDER — FENTANYL CITRATE (PF) 100 MCG/2ML IJ SOLN
INTRAMUSCULAR | Status: DC | PRN
  Administered 2023-04-12: 25 ug via INTRAVENOUS

## 2023-04-12 MED ORDER — HEPARIN SODIUM (PORCINE) 1000 UNIT/ML IJ SOLN
INTRAMUSCULAR | Status: AC
  Filled 2023-04-12: qty 10

## 2023-04-12 MED ORDER — SODIUM CHLORIDE 0.9 % IV SOLN: INTRAVENOUS | Status: DC

## 2023-04-12 MED ORDER — DIPHENHYDRAMINE HCL 50 MG/ML IJ SOLN
INTRAMUSCULAR | Status: AC
  Filled 2023-04-12: qty 1

## 2023-04-12 MED ORDER — HEPARIN (PORCINE) IN NACL 1000-0.9 UT/500ML-% IV SOLN
INTRAVENOUS | Status: DC | PRN
  Administered 2023-04-12 (×2): 500 mL

## 2023-04-12 MED ORDER — LIDOCAINE HCL (PF) 1 % IJ SOLN
INTRAMUSCULAR | Status: DC | PRN
  Administered 2023-04-12: 2 mL

## 2023-04-12 MED ORDER — SODIUM CHLORIDE 0.9% FLUSH: 3.0000 mL | INTRAVENOUS | Status: DC | PRN

## 2023-04-12 MED ORDER — LIDOCAINE HCL (PF) 1 % IJ SOLN
INTRAMUSCULAR | Status: AC
  Filled 2023-04-12: qty 30

## 2023-04-12 MED ORDER — ACETAMINOPHEN 325 MG PO TABS: 650.0000 mg | ORAL_TABLET | ORAL | Status: DC | PRN

## 2023-04-12 MED ORDER — FUROSEMIDE 20 MG PO TABS: 20.0000 mg | ORAL_TABLET | Freq: Every day | ORAL | 11 refills | Status: DC

## 2023-04-12 MED ORDER — MIDAZOLAM HCL 2 MG/2ML IJ SOLN
INTRAMUSCULAR | Status: DC | PRN
  Administered 2023-04-12: 1 mg via INTRAVENOUS

## 2023-04-12 MED ORDER — SODIUM CHLORIDE 0.9 % WEIGHT BASED INFUSION
3.0000 mL/kg/h | INTRAVENOUS | Status: AC
  Administered 2023-04-12: 3 mL/kg/h via INTRAVENOUS

## 2023-04-12 MED ORDER — SODIUM CHLORIDE 0.9% FLUSH: 3.0000 mL | Freq: Two times a day (BID) | INTRAVENOUS | Status: DC

## 2023-04-12 MED ORDER — MIDAZOLAM HCL 2 MG/2ML IJ SOLN
INTRAMUSCULAR | Status: AC
  Filled 2023-04-12: qty 2

## 2023-04-12 MED ORDER — HEPARIN SODIUM (PORCINE) 1000 UNIT/ML IJ SOLN
INTRAMUSCULAR | Status: DC | PRN
  Administered 2023-04-12: 4500 [IU] via INTRAVENOUS

## 2023-04-12 MED ORDER — LABETALOL HCL 5 MG/ML IV SOLN: 10.0000 mg | INTRAVENOUS | Status: DC | PRN

## 2023-04-12 MED ORDER — VERAPAMIL HCL 2.5 MG/ML IV SOLN
INTRAVENOUS | Status: AC
  Filled 2023-04-12: qty 2

## 2023-04-12 MED ORDER — PHENYLEPHRINE 80 MCG/ML (10ML) SYRINGE FOR IV PUSH (FOR BLOOD PRESSURE SUPPORT)
PREFILLED_SYRINGE | INTRAVENOUS | Status: AC
  Filled 2023-04-12: qty 10

## 2023-04-12 MED ORDER — FENTANYL CITRATE (PF) 100 MCG/2ML IJ SOLN
INTRAMUSCULAR | Status: AC
  Filled 2023-04-12: qty 2

## 2023-04-12 MED ORDER — SODIUM CHLORIDE 0.9 % IV SOLN: 250.0000 mL | INTRAVENOUS | Status: DC | PRN

## 2023-04-12 MED ORDER — MORPHINE SULFATE (PF) 2 MG/ML IV SOLN: 1.0000 mg | INTRAVENOUS | Status: DC | PRN

## 2023-04-12 MED ORDER — HYDRALAZINE HCL 20 MG/ML IJ SOLN: 10.0000 mg | INTRAMUSCULAR | Status: DC | PRN

## 2023-04-12 MED ORDER — VERAPAMIL HCL 2.5 MG/ML IV SOLN
INTRAVENOUS | Status: DC | PRN
  Administered 2023-04-12: 10 mL via INTRA_ARTERIAL

## 2023-04-12 MED ORDER — SODIUM CHLORIDE 0.9 % WEIGHT BASED INFUSION: 1.0000 mL/kg/h | INTRAVENOUS | Status: DC

## 2023-04-12 MED ORDER — ONDANSETRON HCL 4 MG/2ML IJ SOLN: 4.0000 mg | Freq: Four times a day (QID) | INTRAMUSCULAR | Status: DC | PRN

## 2023-04-12 MED ORDER — IOHEXOL 350 MG/ML SOLN
INTRAVENOUS | Status: DC | PRN
  Administered 2023-04-12: 53 mL via INTRA_ARTERIAL

## 2023-04-12 MED ORDER — ASPIRIN 81 MG PO CHEW: 81.0000 mg | CHEWABLE_TABLET | ORAL | Status: DC

## 2023-04-12 SURGICAL SUPPLY — 14 items
CATH DIAG 6FR PIGTAIL ANGLED (CATHETERS) IMPLANT
CATH OPTITORQUE TIG 4.0 6F (CATHETERS) IMPLANT
DEVICE RAD COMP TR BAND LRG (VASCULAR PRODUCTS) IMPLANT
GLIDESHEATH SLEND SS 6F .021 (SHEATH) IMPLANT
GUIDEWIRE INQWIRE 1.5J.035X260 (WIRE) IMPLANT
INQWIRE 1.5J .035X260CM (WIRE) ×2
KIT HEART LEFT (KITS) ×2 IMPLANT
PACK CARDIAC CATHETERIZATION (CUSTOM PROCEDURE TRAY) ×2 IMPLANT
PROTECTION STATION PRESSURIZED (MISCELLANEOUS) ×1
SET ATX-X65L (MISCELLANEOUS) IMPLANT
SHEATH PROBE COVER 6X72 (BAG) IMPLANT
STATION PROTECTION PRESSURIZED (MISCELLANEOUS) IMPLANT
TRANSDUCER W/STOPCOCK (MISCELLANEOUS) ×2 IMPLANT
TUBING CIL FLEX 10 FLL-RA (TUBING) ×2 IMPLANT

## 2023-04-12 NOTE — Discharge Instructions (Signed)
Radial Site Care  This sheet gives you information about how to care for yourself after your procedure. Your health care provider may also give you more specific instructions. If you have problems or questions, contact your health care provider. What can I expect after the procedure? After the procedure, it is common to have: Bruising and tenderness at the catheter insertion area. Follow these instructions at home: Medicines Take over-the-counter and prescription medicines only as told by your health care provider. Insertion site care Follow instructions from your health care provider about how to take care of your insertion site. Make sure you: Wash your hands with soap and water before you remove your bandage (dressing). If soap and water are not available, use hand sanitizer. May remove dressing in 24 hours. Check your insertion site every day for signs of infection. Check for: Redness, swelling, or pain. Fluid or blood. Pus or a bad smell. Warmth. Do no take baths, swim, or use a hot tub for 5 days. You may shower 24-48 hours after the procedure. Remove the dressing and gently wash the site with plain soap and water. Pat the area dry with a clean towel. Do not rub the site. That could cause bleeding. Do not apply powder or lotion to the site. Activity  For 24 hours after the procedure, or as directed by your health care provider: Do not flex or bend the affected arm. Do not push or pull heavy objects with the affected arm. Do not drive yourself home from the hospital or clinic. You may drive 24 hours after the procedure. Do not operate machinery or power tools. KEEP ARM ELEVATED THE REMAINDER OF THE DAY. Do not push, pull or lift anything that is heavier than 10 lb for 5 days. Ask your health care provider when it is okay to: Return to work or school. Resume usual physical activities or sports. Resume sexual activity. General instructions If the catheter site starts to  bleed, raise your arm and put firm pressure on the site. If the bleeding does not stop, get help right away. This is a medical emergency. DRINK PLENTY OF FLUIDS FOR THE NEXT 2-3 DAYS. No alcohol consumption for 24 hours after receiving sedation. If you went home on the same day as your procedure, a responsible adult should be with you for the first 24 hours after you arrive home. Keep all follow-up visits as told by your health care provider. This is important. Contact a health care provider if: You have a fever. You have redness, swelling, or yellow drainage around your insertion site. Get help right away if: You have unusual pain at the radial site. The catheter insertion area swells very fast. The insertion area is bleeding, and the bleeding does not stop when you hold steady pressure on the area. Your arm or hand becomes pale, cool, tingly, or numb. These symptoms may represent a serious problem that is an emergency. Do not wait to see if the symptoms will go away. Get medical help right away. Call your local emergency services (911 in the [place] Do not drive yourself to the hospital. Summary After the procedure, it is common to have bruising and tenderness at the site. Follow instructions from your health care provider about how to take care of your radial site wound. Check the wound every day for signs of infection.  This information is not intended to replace advice given to you by your health care provider. Make sure you discuss any questions you have with   your health care provider. Document Revised: 10/13/2017 Document Reviewed: 10/13/2017 Elsevier Patient Education  2020 [employer]  

## 2023-04-12 NOTE — Interval H&P Note (Signed)
History and Physical Interval Note:  04/12/2023 8:40 AM  [name]  has presented today for surgery, with the diagnosis of dynesa - abnormal stress test.  The various methods of treatment have been discussed with the patient and family. After consideration of risks, benefits and other options for treatment, the patient has consented to  Procedure(s): LEFT HEART CATH AND CORONARY ANGIOGRAPHY (N/A). PERCUTANEOUS CORONARY INTERVENTION   as a surgical intervention.  The patient's history has been reviewed, patient examined, no change in status, stable for surgery.  I have reviewed the patient's chart and labs.  Questions were answered to the patient's satisfaction.    Cath Lab Visit (complete for each Cath Lab visit)  Clinical Evaluation Leading to the Procedure:   ACS: No.  Non-ACS:    Anginal Classification: No Symptoms-  Notes DOE  Anti-ischemic medical therapy: Minimal Therapy (1 class of medications)  Non-Invasive Test Results: Intermediate-risk stress test findings: cardiac mortality 1-3%/year - because of reduced EF - NOT seen on Echo/ Equivocal  Prior CABG: No previous CABG    Bryan Lemma

## 2023-04-13 ENCOUNTER — Encounter (HOSPITAL_COMMUNITY): Payer: Self-pay | Admitting: Cardiology

## 2023-04-13 MED ORDER — ROSUVASTATIN CALCIUM 10 MG PO TABS: 10.0000 mg | ORAL_TABLET | Freq: Every day | ORAL | 3 refills | Status: DC

## 2023-04-13 NOTE — Addendum Note (Signed)
Addended by: Eleonore Chiquito on: 04/13/2023 01:49 PM   Modules accepted: Orders

## 2023-05-03 ENCOUNTER — Telehealth: Payer: Self-pay | Admitting: Cardiology

## 2023-05-03 NOTE — Telephone Encounter (Signed)
Patient coming in because he says he was recently prescribed Furosemide and his legs have started to swell. He was told he might need his dose adjusted.  Please advise.  Best number- [phone]

## 2023-05-03 NOTE — Telephone Encounter (Signed)
Spoke with pt who states that he has not gained any weight and his shortness of breath has improved some. Advised to callback with a weight gain of 3 pounds in a day or 5 pounds in a week. Pt advised to maintain a low sodium diet, wear compression hose and keep legs elevated when possible. Pt verbalized understanding and had no additional questions.

## 2023-05-06 ENCOUNTER — Emergency Department (HOSPITAL_COMMUNITY): Payer: 59

## 2023-05-06 ENCOUNTER — Ambulatory Visit (HOSPITAL_COMMUNITY): Payer: Self-pay

## 2023-05-06 ENCOUNTER — Inpatient Hospital Stay (HOSPITAL_COMMUNITY)
Admission: EM | Admit: 2023-05-06 | Discharge: 2023-05-10 | DRG: 291 | Disposition: A | Discharge status: Home / Self Care | Payer: 59 | Attending: Family Medicine | Admitting: Family Medicine

## 2023-05-06 ENCOUNTER — Encounter (HOSPITAL_COMMUNITY): Payer: Self-pay | Admitting: Internal Medicine

## 2023-05-06 ENCOUNTER — Other Ambulatory Visit: Payer: Self-pay

## 2023-05-06 ENCOUNTER — Ambulatory Visit
Admission: RE | Admit: 2023-05-06 | Discharge: 2023-05-06 | Disposition: A | Discharge status: Other Acute Inpatient Hospital | Payer: PPO | Source: Ambulatory Visit | Attending: Physician Assistant | Admitting: Physician Assistant

## 2023-05-06 VITALS — BP 124/56 | HR 90 | Temp 98.3°F | Resp 18

## 2023-05-06 DIAGNOSIS — I272 Pulmonary hypertension, unspecified: Secondary | ICD-10-CM | POA: Diagnosis present

## 2023-05-06 DIAGNOSIS — Z85118 Personal history of other malignant neoplasm of bronchus and lung: Secondary | ICD-10-CM

## 2023-05-06 DIAGNOSIS — Z1152 Encounter for screening for COVID-19: Secondary | ICD-10-CM

## 2023-05-06 DIAGNOSIS — I480 Paroxysmal atrial fibrillation: Secondary | ICD-10-CM | POA: Diagnosis present

## 2023-05-06 DIAGNOSIS — Z79890 Hormone replacement therapy (postmenopausal): Secondary | ICD-10-CM

## 2023-05-06 DIAGNOSIS — J449 Chronic obstructive pulmonary disease, unspecified: Secondary | ICD-10-CM | POA: Diagnosis not present

## 2023-05-06 DIAGNOSIS — R7981 Abnormal blood-gas level: Secondary | ICD-10-CM | POA: Diagnosis not present

## 2023-05-06 DIAGNOSIS — J441 Chronic obstructive pulmonary disease with (acute) exacerbation: Secondary | ICD-10-CM | POA: Diagnosis present

## 2023-05-06 DIAGNOSIS — Z87891 Personal history of nicotine dependence: Secondary | ICD-10-CM

## 2023-05-06 DIAGNOSIS — Z9221 Personal history of antineoplastic chemotherapy: Secondary | ICD-10-CM

## 2023-05-06 DIAGNOSIS — I5032 Chronic diastolic (congestive) heart failure: Secondary | ICD-10-CM | POA: Diagnosis present

## 2023-05-06 DIAGNOSIS — J9611 Chronic respiratory failure with hypoxia: Secondary | ICD-10-CM | POA: Diagnosis present

## 2023-05-06 DIAGNOSIS — I5033 Acute on chronic diastolic (congestive) heart failure: Secondary | ICD-10-CM

## 2023-05-06 DIAGNOSIS — I509 Heart failure, unspecified: Secondary | ICD-10-CM

## 2023-05-06 DIAGNOSIS — Z7901 Long term (current) use of anticoagulants: Secondary | ICD-10-CM

## 2023-05-06 DIAGNOSIS — J9621 Acute and chronic respiratory failure with hypoxia: Secondary | ICD-10-CM | POA: Diagnosis not present

## 2023-05-06 DIAGNOSIS — Z7951 Long term (current) use of inhaled steroids: Secondary | ICD-10-CM

## 2023-05-06 DIAGNOSIS — I5031 Acute diastolic (congestive) heart failure: Secondary | ICD-10-CM | POA: Diagnosis present

## 2023-05-06 DIAGNOSIS — I110 Hypertensive heart disease with heart failure: Principal | ICD-10-CM | POA: Diagnosis present

## 2023-05-06 DIAGNOSIS — C8332 Diffuse large B-cell lymphoma, intrathoracic lymph nodes: Secondary | ICD-10-CM

## 2023-05-06 DIAGNOSIS — N179 Acute kidney failure, unspecified: Secondary | ICD-10-CM | POA: Diagnosis present

## 2023-05-06 DIAGNOSIS — J9601 Acute respiratory failure with hypoxia: Principal | ICD-10-CM

## 2023-05-06 DIAGNOSIS — I4891 Unspecified atrial fibrillation: Secondary | ICD-10-CM

## 2023-05-06 DIAGNOSIS — I10 Essential (primary) hypertension: Secondary | ICD-10-CM

## 2023-05-06 DIAGNOSIS — K219 Gastro-esophageal reflux disease without esophagitis: Secondary | ICD-10-CM

## 2023-05-06 DIAGNOSIS — R0602 Shortness of breath: Secondary | ICD-10-CM

## 2023-05-06 DIAGNOSIS — Z79899 Other long term (current) drug therapy: Secondary | ICD-10-CM

## 2023-05-06 DIAGNOSIS — Z8249 Family history of ischemic heart disease and other diseases of the circulatory system: Secondary | ICD-10-CM

## 2023-05-06 DIAGNOSIS — Z923 Personal history of irradiation: Secondary | ICD-10-CM

## 2023-05-06 DIAGNOSIS — E7800 Pure hypercholesterolemia, unspecified: Secondary | ICD-10-CM

## 2023-05-06 DIAGNOSIS — C8590 Non-Hodgkin lymphoma, unspecified, unspecified site: Secondary | ICD-10-CM | POA: Diagnosis present

## 2023-05-06 DIAGNOSIS — E669 Obesity, unspecified: Secondary | ICD-10-CM

## 2023-05-06 DIAGNOSIS — C3490 Malignant neoplasm of unspecified part of unspecified bronchus or lung: Secondary | ICD-10-CM | POA: Diagnosis present

## 2023-05-06 DIAGNOSIS — D751 Secondary polycythemia: Secondary | ICD-10-CM | POA: Diagnosis present

## 2023-05-06 DIAGNOSIS — E039 Hypothyroidism, unspecified: Secondary | ICD-10-CM

## 2023-05-06 DIAGNOSIS — M109 Gout, unspecified: Secondary | ICD-10-CM | POA: Diagnosis present

## 2023-05-06 DIAGNOSIS — F32A Depression, unspecified: Secondary | ICD-10-CM | POA: Diagnosis present

## 2023-05-06 DIAGNOSIS — N400 Enlarged prostate without lower urinary tract symptoms: Secondary | ICD-10-CM | POA: Diagnosis present

## 2023-05-06 DIAGNOSIS — C3491 Malignant neoplasm of unspecified part of right bronchus or lung: Secondary | ICD-10-CM

## 2023-05-06 DIAGNOSIS — I2510 Atherosclerotic heart disease of native coronary artery without angina pectoris: Secondary | ICD-10-CM | POA: Diagnosis present

## 2023-05-06 DIAGNOSIS — Z8572 Personal history of non-Hodgkin lymphomas: Secondary | ICD-10-CM

## 2023-05-06 DIAGNOSIS — D649 Anemia, unspecified: Secondary | ICD-10-CM | POA: Diagnosis present

## 2023-05-06 HISTORY — DX: Acute and chronic respiratory failure with hypoxia: J96.21

## 2023-05-06 HISTORY — DX: Acute on chronic diastolic (congestive) heart failure: I50.33

## 2023-05-06 LAB — CBC WITH DIFFERENTIAL/PLATELET
Abs Immature Granulocytes: 0.04 10*3/uL (ref 0.00–0.07)
Basophils Absolute: 0 10*3/uL (ref 0.0–0.1)
Basophils Relative: 0 %
Eosinophils Absolute: 0 10*3/uL (ref 0.0–0.5)
Eosinophils Relative: 0 %
HCT: 31 % — ABNORMAL LOW (ref 39.0–52.0)
Hemoglobin: 9.6 g/dL — ABNORMAL LOW (ref 13.0–17.0)
Immature Granulocytes: 1 %
Lymphocytes Relative: 10 %
Lymphs Abs: 0.9 10*3/uL (ref 0.7–4.0)
MCH: 28.1 pg (ref 26.0–34.0)
MCHC: 31 g/dL (ref 30.0–36.0)
MCV: 90.6 fL (ref 80.0–100.0)
Monocytes Absolute: 0.7 10*3/uL (ref 0.1–1.0)
Monocytes Relative: 8 %
Neutro Abs: 7.2 10*3/uL (ref 1.7–7.7)
Neutrophils Relative %: 81 %
Platelets: 218 10*3/uL (ref 150–400)
RBC: 3.42 MIL/uL — ABNORMAL LOW (ref 4.22–5.81)
RDW: 13.7 % (ref 11.5–15.5)
WBC: 8.9 10*3/uL (ref 4.0–10.5)
nRBC: 0 % (ref 0.0–0.2)

## 2023-05-06 LAB — COMPREHENSIVE METABOLIC PANEL
ALT: 15 U/L (ref 0–44)
AST: 14 U/L — ABNORMAL LOW (ref 15–41)
Albumin: 2.8 g/dL — ABNORMAL LOW (ref 3.5–5.0)
Alkaline Phosphatase: 69 U/L (ref 38–126)
Anion gap: 11 (ref 5–15)
BUN: 19 mg/dL (ref 8–23)
CO2: 29 mmol/L (ref 22–32)
Calcium: 8.5 mg/dL — ABNORMAL LOW (ref 8.9–10.3)
Chloride: 99 mmol/L (ref 98–111)
Creatinine, Ser: 1.23 mg/dL (ref 0.61–1.24)
GFR, Estimated: >60 mL/min (ref >60)
Glucose, Bld: 112 mg/dL — ABNORMAL HIGH (ref 70–99)
Potassium: 4.1 mmol/L (ref 3.5–5.1)
Sodium: 139 mmol/L (ref 135–145)
Total Bilirubin: 0.9 mg/dL (ref 0.3–1.2)
Total Protein: 5.4 g/dL — ABNORMAL LOW (ref 6.5–8.1)

## 2023-05-06 LAB — PROCALCITONIN: Procalcitonin: <0.1 ng/mL

## 2023-05-06 LAB — TROPONIN I (HIGH SENSITIVITY): Troponin I (High Sensitivity): 8 ng/L (ref <18)

## 2023-05-06 LAB — I-STAT CG4 LACTIC ACID, ED: Lactic Acid, Venous: 1 mmol/L (ref 0.5–1.9)

## 2023-05-06 LAB — BRAIN NATRIURETIC PEPTIDE: B Natriuretic Peptide: 440.2 pg/mL — ABNORMAL HIGH (ref 0.0–100.0)

## 2023-05-06 LAB — MAGNESIUM: Magnesium: 0.9 mg/dL — CL (ref 1.7–2.4)

## 2023-05-06 LAB — SARS CORONAVIRUS 2 BY RT PCR: SARS Coronavirus 2 by RT PCR: NEGATIVE

## 2023-05-06 MED ORDER — METOPROLOL SUCCINATE ER 25 MG PO TB24
25.0000 mg | ORAL_TABLET | Freq: Every day | ORAL | Status: DC
  Administered 2023-05-06 – 2023-05-09 (×4): 25 mg via ORAL
  Filled 2023-05-06 (×4): qty 1

## 2023-05-06 MED ORDER — AMLODIPINE BESYLATE 5 MG PO TABS
5.0000 mg | ORAL_TABLET | Freq: Every day | ORAL | Status: DC
  Administered 2023-05-07 – 2023-05-10 (×4): 5 mg via ORAL
  Filled 2023-05-06 (×4): qty 1

## 2023-05-06 MED ORDER — FUROSEMIDE 10 MG/ML IJ SOLN
40.0000 mg | Freq: Two times a day (BID) | INTRAMUSCULAR | Status: DC
  Administered 2023-05-07: 40 mg via INTRAVENOUS
  Filled 2023-05-06: qty 4

## 2023-05-06 MED ORDER — POLYETHYLENE GLYCOL 3350 17 G PO PACK: 17.0000 g | PACK | Freq: Every day | ORAL | Status: DC | PRN

## 2023-05-06 MED ORDER — LEVOTHYROXINE SODIUM 75 MCG PO TABS
175.0000 ug | ORAL_TABLET | Freq: Every day | ORAL | Status: DC
  Administered 2023-05-07 – 2023-05-10 (×4): 175 ug via ORAL
  Filled 2023-05-06 (×4): qty 1

## 2023-05-06 MED ORDER — UMECLIDINIUM BROMIDE 62.5 MCG/ACT IN AEPB
1.0000 | INHALATION_SPRAY | Freq: Every day | RESPIRATORY_TRACT | Status: DC
  Administered 2023-05-07: 1 via RESPIRATORY_TRACT
  Filled 2023-05-06: qty 7

## 2023-05-06 MED ORDER — EZETIMIBE 10 MG PO TABS
10.0000 mg | ORAL_TABLET | Freq: Every day | ORAL | Status: DC
  Administered 2023-05-07 – 2023-05-10 (×4): 10 mg via ORAL
  Filled 2023-05-06 (×4): qty 1

## 2023-05-06 MED ORDER — ACETAMINOPHEN 325 MG PO TABS: 650.0000 mg | ORAL_TABLET | Freq: Four times a day (QID) | ORAL | Status: DC | PRN

## 2023-05-06 MED ORDER — LISINOPRIL 10 MG PO TABS
10.0000 mg | ORAL_TABLET | Freq: Every day | ORAL | Status: DC
  Administered 2023-05-07 – 2023-05-10 (×4): 10 mg via ORAL
  Filled 2023-05-06 (×4): qty 1

## 2023-05-06 MED ORDER — ALLOPURINOL 300 MG PO TABS
300.0000 mg | ORAL_TABLET | Freq: Every day | ORAL | Status: DC
  Administered 2023-05-07 – 2023-05-10 (×4): 300 mg via ORAL
  Filled 2023-05-06 (×4): qty 1

## 2023-05-06 MED ORDER — FUROSEMIDE 10 MG/ML IJ SOLN
20.0000 mg | Freq: Once | INTRAMUSCULAR | Status: AC
  Administered 2023-05-06: 20 mg via INTRAVENOUS
  Filled 2023-05-06: qty 2

## 2023-05-06 MED ORDER — ACETAMINOPHEN 650 MG RE SUPP: 650.0000 mg | Freq: Four times a day (QID) | RECTAL | Status: DC | PRN

## 2023-05-06 MED ORDER — ROSUVASTATIN CALCIUM 5 MG PO TABS
10.0000 mg | ORAL_TABLET | Freq: Every day | ORAL | Status: DC
  Administered 2023-05-10: 10 mg via ORAL
  Filled 2023-05-06 (×3): qty 2

## 2023-05-06 MED ORDER — PANTOPRAZOLE SODIUM 40 MG PO TBEC
40.0000 mg | DELAYED_RELEASE_TABLET | Freq: Every day | ORAL | Status: DC
  Administered 2023-05-10: 40 mg via ORAL
  Filled 2023-05-06 (×4): qty 1

## 2023-05-06 MED ORDER — FINASTERIDE 5 MG PO TABS
5.0000 mg | ORAL_TABLET | Freq: Every day | ORAL | Status: DC
  Administered 2023-05-07 – 2023-05-10 (×4): 5 mg via ORAL
  Filled 2023-05-06 (×4): qty 1

## 2023-05-06 MED ORDER — SODIUM CHLORIDE 0.9% FLUSH
3.0000 mL | Freq: Two times a day (BID) | INTRAVENOUS | Status: DC
  Administered 2023-05-06 – 2023-05-10 (×8): 3 mL via INTRAVENOUS

## 2023-05-06 MED ORDER — ALBUTEROL SULFATE (2.5 MG/3ML) 0.083% IN NEBU: 2.5000 mg | INHALATION_SOLUTION | RESPIRATORY_TRACT | Status: DC | PRN

## 2023-05-06 MED ORDER — SODIUM CHLORIDE 0.9 % IV SOLN
500.0000 mg | Freq: Once | INTRAVENOUS | Status: DC
  Filled 2023-05-06: qty 5

## 2023-05-06 MED ORDER — MAGNESIUM SULFATE 2 GM/50ML IV SOLN
2.0000 g | Freq: Once | INTRAVENOUS | Status: AC
  Administered 2023-05-06: 2 g via INTRAVENOUS
  Filled 2023-05-06: qty 50

## 2023-05-06 MED ORDER — SODIUM CHLORIDE 0.9 % IV SOLN
1.0000 g | Freq: Once | INTRAVENOUS | Status: DC
  Filled 2023-05-06: qty 10

## 2023-05-06 MED ORDER — FLUTICASONE FUROATE-VILANTEROL 100-25 MCG/ACT IN AEPB
1.0000 | INHALATION_SPRAY | Freq: Every day | RESPIRATORY_TRACT | Status: DC
  Administered 2023-05-07 – 2023-05-10 (×4): 1 via RESPIRATORY_TRACT
  Filled 2023-05-06 (×2): qty 28

## 2023-05-06 MED ORDER — APIXABAN 5 MG PO TABS
5.0000 mg | ORAL_TABLET | Freq: Two times a day (BID) | ORAL | Status: DC
  Administered 2023-05-06 – 2023-05-10 (×8): 5 mg via ORAL
  Filled 2023-05-06 (×8): qty 1

## 2023-05-06 NOTE — Progress Notes (Signed)
The patient is admitted from ED to 3 E 05. A & O x 4. Deniee any acute pain. The patient is oriented to his room, ascom/call bell and staff. Full assessment completed . Will continue to monitor.

## 2023-05-06 NOTE — ED Notes (Signed)
ED TO INPATIENT HANDOFF REPORT  ED Nurse Name and Phone #:  [phone]  S Name/Age/Gender [name] 72 y.o. male Room/Bed: 028C/028C  Code Status   Code Status: Full Code  Home/SNF/Other Home Patient oriented to: self, place, time, and situation Is this baseline? Yes   Triage Complete: Triage complete  Chief Complaint Acute on chronic respiratory failure with hypoxia [facility]) [J96.21]  Triage Note Patient BIB EMS from urgent care this afternoon.   SOB x3 days with exertion  HOH  Hx of COPD and lung cancer  UC for evaluation and his stats on RA was 75%. Wears 2 L only at night.   Afib at this moment  136 CBG  3L via [place] at 95%  18g right FA   Allergies Allergies  Allergen Reactions   Omnipaque [Iohexol] Itching    When asked about contrast media today, pt stated that last time he had the contrast that he was red all over and itchy from head to toe.     Level of Care/Admitting Diagnosis ED Disposition     ED Disposition  Admit   Condition  --   Comment  Hospital Area: [facility] [[id number]]  Level of Care: Telemetry Cardiac [103]  May place patient in observation at [facility] if equivalent level of care is available:: No  Covid Evaluation: Symptomatic Person Under Investigation (PUI) or recent exposure (last 10 days) *Testing Required*  Diagnosis: Acute on chronic respiratory failure with hypoxia [facility]) [[id number]]  Admitting Physician: Synetta Fail [[id number]]  Attending Physician: Synetta Fail [[id number]]          B Medical/Surgery History Past Medical History:  Diagnosis Date   Adjustment disorder with other symptom 11/27/2015   AKI (acute kidney injury) (HCC) 01/14/2016   Aortic atherosclerosis (HCC) 01/26/2023   Arthritis    Atrial fibrillation (HCC) 10/26/2011   BPH (benign prostatic hyperplasia) 2011   BRONCHOPNEUMONIA ORGANISM UNSPECIFIED 06/12/2010         Replacing diagnoses that  were inactivated after the 12/21/22 regulatory import   Carpal tunnel syndrome, right upper limb 11/03/2022   Chronic respiratory failure with hypoxia (HCC) 08/27/2015   Congestive heart failure (HCC)    COPD (chronic obstructive pulmonary disease) (HCC)    COPD, severe (HCC) 11/27/2015   12/18/2015-pulmonary function test- FVC 2.75 (55% predicted), postbronchodilator ratio 56, postbronchodilator FEV1 1.66 (45% predicted), no significant bronchodilator response, mid flow reversibility, DLCO 33  >>>Severe obstructive airways disease, Severe diffusion defect      Coronary artery calcification 01/26/2023   Depression    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 [facility])    Drug-induced neutropenia (HCC) 10/08/2015   Encounter for antineoplastic chemotherapy 10/08/2015   Essential hypertension 06/05/2008   Qualifier: Diagnosis of   By: Truman Hayward Duncan Dull), [name]       Gastrointestinal obstruction (HCC)    Ileus   GERD (gastroesophageal reflux disease)    Gout attack 08/29/2012   Hx of colonoscopy    Hypothyroidism 03/30/2012   Lesion of ulnar nerve, right upper limb 11/03/2022   Lung cancer (HCC) 2011   Lymphadenopathy syndrome 09/12/2015   Mediastinal adenopathy 09/12/2015   NHL (non-Hodgkin's lymphoma) (HCC) 09/25/2015   Numbness of right hand 06/28/2022   Obesity    Open wound of right hand without foreign body 07/27/2021   Other dysphagia 11/11/2010   Qualifier: Diagnosis of   By: Marchelle Gearing MD, [name]       Polycythemia 01/26/2023  POLYCYTHEMIA 06/05/2008   Qualifier: Diagnosis of   By: Waldo Laine), [facility] catheter in place 08/20/2016   Post-nasal drip 10/04/2011   Primary cancer of right upper lobe of lung (HCC) 10/25/2019   Squamous cell   Pulmonary hypertension (HCC)    Pulmonary nodule, right 10/03/2019   Pure hypercholesterolemia 12/03/2011   Recurrent pneumonia 02/10/2012   Routine general medical examination at a health care facility  12/03/2011   Sepsis (HCC) 01/14/2016   Severe sepsis (HCC) 01/14/2016   Squamous cell carcinoma lung (HCC) 09/26/2010   S/p LUL resection Dr [name] dec 2011      Status post bronchoscopy with biopsy    Stiffness of finger joint of right hand 09/24/2021   Streptococcal pneumonia (HCC) 04/2008   Testicular swelling 09/12/2015   Past Surgical History:  Procedure Laterality Date   APPENDECTOMY     BACK SURGERY     CARDIOVERSION  11/27/2011   Procedure: CARDIOVERSION;  Surgeon: Vesta Mixer, MD;  Location: [facility] ENDOSCOPY;  Service: Cardiovascular;  Laterality: N/A;   COLONOSCOPY     FUDUCIAL PLACEMENT Right 10/13/2019   Procedure: Placement Of Fuducial;  Surgeon: Leslye Peer, MD;  Location: [facility] OR;  Service: Thoracic;  Laterality: Right;  Right Upper Lobe    LEFT HEART CATH AND CORONARY ANGIOGRAPHY N/A 04/12/2023   Procedure: LEFT HEART CATH AND CORONARY ANGIOGRAPHY;  Surgeon: Marykay Lex, MD;  Location: [facility] INVASIVE CV LAB;  Service: Cardiovascular;  Laterality: N/A;   LUNG CANCER SURGERY  2011   UPPER GI ENDOSCOPY     VIDEO BRONCHOSCOPY WITH ENDOBRONCHIAL NAVIGATION N/A 10/13/2019   Procedure: VIDEO BRONCHOSCOPY WITH ENDOBRONCHIAL NAVIGATION;  Surgeon: Leslye Peer, MD;  Location: [facility] OR;  Service: Thoracic;  Laterality: N/A;   VIDEO BRONCHOSCOPY WITH ENDOBRONCHIAL ULTRASOUND N/A 10/13/2019   Procedure: VIDEO BRONCHOSCOPY WITH ENDOBRONCHIAL ULTRASOUND;  Surgeon: Leslye Peer, MD;  Location: [facility] OR;  Service: Thoracic;  Laterality: N/A;     A IV Location/Drains/Wounds Patient Lines/Drains/Airways Status     Active Line/Drains/Airways     Name Placement date Placement time Site Days   Implanted Port 10/03/15 Right Chest 10/03/15  1505  Chest  2772   Peripheral IV 05/06/23 18 G Right Antecubital 05/06/23  --  Antecubital  less than 1   Wound / Incision (Open or Dehisced) Hand Right skin tear --  --  Hand  --            Intake/Output Last 24 hours No intake or output  data in the 24 hours ending 05/06/23 1825  Labs/Imaging Results for orders placed or performed during the hospital encounter of 05/06/23 (from the past 48 hour(s))  Comprehensive metabolic panel     Status: Abnormal   Collection Time: 05/06/23  4:03 PM  Result Value Ref Range   Sodium 139 135 - 145 mmol/L   Potassium 4.1 3.5 - 5.1 mmol/L   Chloride 99 98 - 111 mmol/L   CO2 29 22 - 32 mmol/L   Glucose, Bld 112 (H) 70 - 99 mg/dL    Comment: Glucose reference range applies only to samples taken after fasting for at least 8 hours.   BUN 19 8 - 23 mg/dL   Creatinine, Ser 6.21 0.61 - 1.24 mg/dL   Calcium 8.5 (L) 8.9 - 10.3 mg/dL   Total Protein 5.4 (L) 6.5 - 8.1 g/dL   Albumin 2.8 (L) 3.5 - 5.0 g/dL   AST 14 (L) 15 -  41 U/L   ALT 15 0 - 44 U/L   Alkaline Phosphatase 69 38 - 126 U/L   Total Bilirubin 0.9 0.3 - 1.2 mg/dL   GFR, Estimated >40 >98 mL/min    Comment: (NOTE) Calculated using the CKD-EPI Creatinine Equation (2021)    Anion gap 11 5 - 15    Comment: Performed at [facility], [address], [place], [place] [address]  Brain natriuretic peptide     Status: Abnormal   Collection Time: 05/06/23  4:03 PM  Result Value Ref Range   B Natriuretic Peptide 440.2 (H) 0.0 - 100.0 pg/mL    Comment: Performed at [facility], [address], [place], [place] [address]  Troponin I (High Sensitivity)     Status: None   Collection Time: 05/06/23  4:03 PM  Result Value Ref Range   Troponin I (High Sensitivity) 8 <18 ng/L    Comment: (NOTE) Elevated high sensitivity troponin I (hsTnI) values and significant  changes across serial measurements may suggest ACS but many other  chronic and acute conditions are known to elevate hsTnI results.  Refer to the "Links" section for chest pain algorithms and additional  guidance. Performed at [facility], [address] [place], [place] [address]   CBC with Differential     Status: Abnormal   Collection Time: 05/06/23  4:03  PM  Result Value Ref Range   WBC 8.9 4.0 - 10.5 K/uL   RBC 3.42 (L) 4.22 - 5.81 MIL/uL   Hemoglobin 9.6 (L) 13.0 - 17.0 g/dL   HCT 86.5 (L) 78.4 - 69.6 %   MCV 90.6 80.0 - 100.0 fL   MCH 28.1 26.0 - 34.0 pg   MCHC 31.0 30.0 - 36.0 g/dL   RDW 29.5 28.4 - 13.2 %   Platelets 218 150 - 400 K/uL   nRBC 0.0 0.0 - 0.2 %   Neutrophils Relative % 81 %   Neutro Abs 7.2 1.7 - 7.7 K/uL   Lymphocytes Relative 10 %   Lymphs Abs 0.9 0.7 - 4.0 K/uL   Monocytes Relative 8 %   Monocytes Absolute 0.7 0.1 - 1.0 K/uL   Eosinophils Relative 0 %   Eosinophils Absolute 0.0 0.0 - 0.5 K/uL   Basophils Relative 0 %   Basophils Absolute 0.0 0.0 - 0.1 K/uL   Immature Granulocytes 1 %   Abs Immature Granulocytes 0.04 0.00 - 0.07 K/uL    Comment: Performed at [facility], [address], [place], [place] [address]   DG Chest 2 View  Result Date: 05/06/2023 CLINICAL DATA:  Shortness of breath. EXAM: CHEST - 2 VIEW COMPARISON:  Radiographs 02/12/2023 and 01/23/2023.  CT 01/23/2023. FINDINGS: The frontal examination was repeated. Right IJ Port-A-Cath tip is unchanged, extending to the level of the superior cavoatrial junction. The heart size and mediastinal contours are stable. There is stable chronic right upper lobe consolidation with volume loss attributed to prior radiation therapy for lung cancer. There are new small bilateral pleural effusions with extension of fluid into the fissures, best seen on the lateral view. There are new patchy airspace opacities at both lung bases which could be secondary to bronchopneumonia or asymmetric edema. No evidence of pneumothorax or acute osseous abnormality. IMPRESSION: New small bilateral pleural effusions and patchy bibasilar airspace opacities which could be secondary to bronchopneumonia or asymmetric edema. Electronically Signed   By: Carey Bullocks M.D.   On: 05/06/2023 17:06    Pending  Labs [employer] (From admission, onward)     Start     Ordered    05/07/23 0500  Comprehensive metabolic panel  Tomorrow morning,   R        05/06/23 1811   05/07/23 0500  CBC  Tomorrow morning,   R        05/06/23 1811   05/06/23 1812  Procalcitonin  Daily,   R     References:    Procalcitonin Lower Respiratory Tract Infection AND Sepsis Procalcitonin Algorithm   05/06/23 1811   05/06/23 1808  Magnesium  Add-on,   AD        05/06/23 1811   05/06/23 1737  Culture, blood (routine x 2)  BLOOD CULTURE X 2,   R (with STAT occurrences)      05/06/23 1737   05/06/23 1545  SARS Coronavirus 2 by RT PCR (hospital order, performed in [facility] hospital lab) *cepheid single result test* Anterior Nasal Swab  (SARS Coronavirus 2 by RT PCR (hospital order, performed in [facility] hospital lab) *cepheid single result test*)  Once,   URGENT        05/06/23 1544            Vitals/Pain Today's Vitals   05/06/23 1500 05/06/23 1522 05/06/23 1802 05/06/23 1805  BP: 133/69   (!) 141/70  Pulse: 86   96  Resp: 20   17  Temp:  98.2 F (36.8 C) 97.9 F (36.6 C)   TempSrc:  Oral Oral   SpO2: 97%   97%  PainSc:        Isolation Precautions Airborne and Contact precautions  Medications Medications  allopurinol (ZYLOPRIM) tablet 300 mg (has no administration in time range)  amLODipine (NORVASC) tablet 5 mg (has no administration in time range)  ezetimibe (ZETIA) tablet 10 mg (has no administration in time range)  lisinopril (ZESTRIL) tablet 10 mg (has no administration in time range)  metoprolol succinate (TOPROL-XL) 24 hr tablet 25 mg (has no administration in time range)  rosuvastatin (CRESTOR) tablet 10 mg (has no administration in time range)  levothyroxine (SYNTHROID) tablet 175 mcg (has no administration in time range)  pantoprazole (PROTONIX) EC tablet 40 mg (has no administration in time range)  finasteride (PROSCAR) tablet 5 mg (has no administration in time range)  apixaban (ELIQUIS) tablet 5 mg (has no administration in time range)  fluticasone  furoate-vilanterol (BREO ELLIPTA) 100-25 MCG/ACT 1 puff (has no administration in time range)    And  umeclidinium bromide (INCRUSE ELLIPTA) 62.5 MCG/ACT 1 puff (has no administration in time range)  albuterol (PROVENTIL) (2.5 MG/3ML) 0.083% nebulizer solution 2.5 mg (has no administration in time range)  sodium chloride flush (NS) 0.9 % injection 3 mL (has no administration in time range)  furosemide (LASIX) injection 40 mg (has no administration in time range)  acetaminophen (TYLENOL) tablet 650 mg (has no administration in time range)    Or  acetaminophen (TYLENOL) suppository 650 mg (has no administration in time range)  polyethylene glycol (MIRALAX / GLYCOLAX) packet 17 g (has no administration in time range)  furosemide (LASIX) injection 20 mg (20 mg Intravenous Given 05/06/23 1814)    Mobility walks     Focused Assessments    R Recommendations: See Admitting Provider Note  Report given to:   Additional Notes:

## 2023-05-06 NOTE — ED Provider Notes (Signed)
[facility]    CSN: [id number] Arrival date & time: 05/06/23  1236      History   Chief Complaint Chief Complaint  Patient presents with   Cough    Entered by patient    HPI [name] is a 72 y.o. male.   Patient presents today due to shortness of breath with exertion that started about 3 days ago.  Patient states that he has a baseline cough given he has a history of COPD.  Cough has not changed and denies any associated upper respiratory symptoms, known sick contacts, fever.  Patient is concerned for pneumonia as he has had this multiple times in the past.  He wears oxygen at home at night only as needed.  He does not currently smoke cigarettes.  Reports history of left upper lobectomy due to lung cancer.  He also has a history of atrial fibrillation and takes Eliquis.   Cough   Past Medical History:  Diagnosis Date   Adjustment disorder with other symptom 11/27/2015   AKI (acute kidney injury) (HCC) 01/14/2016   Aortic atherosclerosis (HCC) 01/26/2023   Arthritis    Atrial fibrillation (HCC) 10/26/2011   BPH (benign prostatic hyperplasia) 2011   BRONCHOPNEUMONIA ORGANISM UNSPECIFIED 06/12/2010         Replacing diagnoses that were inactivated after the 12/21/22 regulatory import   Carpal tunnel syndrome, right upper limb 11/03/2022   Chronic respiratory failure with hypoxia (HCC) 08/27/2015   Congestive heart failure (HCC)    COPD (chronic obstructive pulmonary disease) (HCC)    COPD, severe (HCC) 11/27/2015   12/18/2015-pulmonary function test- FVC 2.75 (55% predicted), postbronchodilator ratio 56, postbronchodilator FEV1 1.66 (45% predicted), no significant bronchodilator response, mid flow reversibility, DLCO 33  >>>Severe obstructive airways disease, Severe diffusion defect      Coronary artery calcification 01/26/2023   Depression    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)    Drug-induced neutropenia (HCC)  10/08/2015   Encounter for antineoplastic chemotherapy 10/08/2015   Essential hypertension 06/05/2008   Qualifier: Diagnosis of   By: Truman Hayward Duncan Dull), [name]       Gastrointestinal obstruction (HCC)    Ileus   GERD (gastroesophageal reflux disease)    Gout attack 08/29/2012   Hx of colonoscopy    Hypothyroidism 03/30/2012   Lesion of ulnar nerve, right upper limb 11/03/2022   Lung cancer (HCC) 2011   Lymphadenopathy syndrome 09/12/2015   Mediastinal adenopathy 09/12/2015   NHL (non-Hodgkin's lymphoma) (HCC) 09/25/2015   Numbness of right hand 06/28/2022   Obesity    Open wound of right hand without foreign body 07/27/2021   Other dysphagia 11/11/2010   Qualifier: Diagnosis of   By: Marchelle Gearing MD, [name]       Polycythemia 01/26/2023   POLYCYTHEMIA 06/05/2008   Qualifier: Diagnosis of   By: Truman Hayward Duncan Dull), [name]       Port catheter in place 08/20/2016   Post-nasal drip 10/04/2011   Primary cancer of right upper lobe of lung (HCC) 10/25/2019   Squamous cell   Pulmonary hypertension (HCC)    Pulmonary nodule, right 10/03/2019   Pure hypercholesterolemia 12/03/2011   Recurrent pneumonia 02/10/2012   Routine general medical examination at a health care facility 12/03/2011   Sepsis (HCC) 01/14/2016   Severe sepsis (HCC) 01/14/2016   Squamous cell carcinoma lung (HCC) 09/26/2010   S/p LUL resection Dr [name] dec 2011      Status post bronchoscopy with biopsy  Stiffness of finger joint of right hand 09/24/2021   Streptococcal pneumonia (HCC) 04/2008   Testicular swelling 09/12/2015    Patient Active Problem List   Diagnosis Date Noted   DOE (dyspnea on exertion) 04/07/2023   Arthritis 01/26/2023   Congestive heart failure (HCC) 01/26/2023   Gastrointestinal obstruction (HCC) 01/26/2023   GERD (gastroesophageal reflux disease) 01/26/2023   Hx of colonoscopy 01/26/2023   Obesity 01/26/2023   Polycythemia 01/26/2023   Pulmonary hypertension (HCC) 01/26/2023   Aortic  atherosclerosis (HCC) 01/26/2023   Coronary artery calcification 01/26/2023   Carpal tunnel syndrome, right upper limb 11/03/2022   Lesion of ulnar nerve, right upper limb 11/03/2022   Numbness of right hand 06/28/2022   Stiffness of finger joint of right hand 09/24/2021   Open wound of right hand without foreign body 07/27/2021   Primary cancer of right upper lobe of lung (HCC) 10/25/2019   Status post bronchoscopy with biopsy    Pulmonary nodule, right 10/03/2019   Port catheter in place 08/20/2016   Sepsis (HCC) 01/14/2016   AKI (acute kidney injury) (HCC) 01/14/2016   Severe sepsis (HCC) 01/14/2016   COPD, severe (HCC) 11/27/2015   Adjustment disorder with other symptom 11/27/2015   Encounter for antineoplastic chemotherapy 10/08/2015   Drug-induced neutropenia (HCC) 10/08/2015   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)    NHL (non-Hodgkin's lymphoma) (HCC) 09/25/2015   Lymphadenopathy syndrome 09/12/2015   Mediastinal adenopathy 09/12/2015   Testicular swelling 09/12/2015   Chronic respiratory failure with hypoxia (HCC) 08/27/2015   Gout attack 08/29/2012   Hypothyroidism 03/30/2012   Recurrent pneumonia 02/10/2012   Routine general medical examination at a health care facility 12/03/2011   Depression 12/03/2011   Unspecified hypothyroidism 12/03/2011   Pure hypercholesterolemia 12/03/2011   Atrial fibrillation (HCC) 10/26/2011   Post-nasal drip 10/04/2011   Other dysphagia 11/11/2010   Squamous cell carcinoma lung (HCC) 09/26/2010   BRONCHOPNEUMONIA ORGANISM UNSPECIFIED 06/12/2010   BPH (benign prostatic hyperplasia) 2011   Lung cancer (HCC) 2011   ERECTILE DYSFUNCTION 09/24/2008   POLYCYTHEMIA 06/05/2008   Essential hypertension 06/05/2008   COPD (chronic obstructive pulmonary disease) (HCC) 06/05/2008   Streptococcal pneumonia (HCC) 04/2008    Past Surgical History:  Procedure Laterality Date   APPENDECTOMY     BACK SURGERY      CARDIOVERSION  11/27/2011   Procedure: CARDIOVERSION;  Surgeon: Vesta Mixer, MD;  Location: [facility] ENDOSCOPY;  Service: Cardiovascular;  Laterality: N/A;   COLONOSCOPY     FUDUCIAL PLACEMENT Right 10/13/2019   Procedure: Placement Of Fuducial;  Surgeon: Leslye Peer, MD;  Location: [facility] OR;  Service: Thoracic;  Laterality: Right;  Right Upper Lobe    LEFT HEART CATH AND CORONARY ANGIOGRAPHY N/A 04/12/2023   Procedure: LEFT HEART CATH AND CORONARY ANGIOGRAPHY;  Surgeon: Marykay Lex, MD;  Location: [facility] INVASIVE CV LAB;  Service: Cardiovascular;  Laterality: N/A;   LUNG CANCER SURGERY  2011   UPPER GI ENDOSCOPY     VIDEO BRONCHOSCOPY WITH ENDOBRONCHIAL NAVIGATION N/A 10/13/2019   Procedure: VIDEO BRONCHOSCOPY WITH ENDOBRONCHIAL NAVIGATION;  Surgeon: Leslye Peer, MD;  Location: [facility] OR;  Service: Thoracic;  Laterality: N/A;   VIDEO BRONCHOSCOPY WITH ENDOBRONCHIAL ULTRASOUND N/A 10/13/2019   Procedure: VIDEO BRONCHOSCOPY WITH ENDOBRONCHIAL ULTRASOUND;  Surgeon: Leslye Peer, MD;  Location: [facility] OR;  Service: Thoracic;  Laterality: N/A;       Home Medications    Prior to Admission medications   Medication Sig Start  Date End Date Taking? Authorizing Provider  albuterol (PROVENTIL) (2.5 MG/3ML) 0.083% nebulizer solution Take 3 mLs (2.5 mg total) by nebulization every 4 (four) hours as needed for wheezing or shortness of breath. 10/02/17  Yes Albertine Grates, MD  allopurinol (ZYLOPRIM) 300 MG tablet Take 300 mg by mouth daily. 01/15/17  Yes [provider]  amLODipine (NORVASC) 5 MG tablet Take 5 mg by mouth daily. 01/21/20  Yes [provider]  apixaban (ELIQUIS) 5 MG TABS tablet Take 1 tablet (5 mg total) by mouth 2 (two) times daily. 02/02/23 01/28/24 Yes Revankar, Aundra Dubin, MD  Buprenorphine HCl-Naloxone HCl 8-2 MG FILM Place 0.5 Film under the tongue daily as needed (back pain). 03/24/23  Yes [provider]  ezetimibe (ZETIA) 10 MG tablet Take 10 mg by mouth daily. 02/09/23  Yes  [provider]  finasteride (PROSCAR) 5 MG tablet Take 5 mg by mouth daily. 03/13/19  Yes [provider]  furosemide (LASIX) 20 MG tablet Take 1 tablet (20 mg total) by mouth daily. 04/12/23 04/11/24 Yes Marykay Lex, MD  levothyroxine (SYNTHROID) 175 MCG tablet Take 175 mcg by mouth daily. 10/29/22  Yes [provider]  LINZESS 290 MCG CAPS capsule Take 290 mcg by mouth daily. 01/19/23  Yes [provider]  lisinopril (PRINIVIL,ZESTRIL) 10 MG tablet Take 1 tablet (10 mg total) by mouth daily. 12/29/11  Yes Nahser, Deloris Ping, MD  Melatonin 10 MG CAPS Take 10 mg by mouth at bedtime as needed (sleep).   Yes [provider]  metoprolol succinate (TOPROL XL) 25 MG 24 hr tablet Take 1 tablet (25 mg total) by mouth daily. 02/08/23  Yes Revankar, Aundra Dubin, MD  omeprazole (PRILOSEC) 20 MG capsule Take 1 capsule (20 mg total) by mouth 2 (two) times daily. Patient taking differently: Take 20 mg by mouth daily. 10/13/19  Yes Leslye Peer, MD  PROAIR HFA [phone] Base) MCG/ACT inhaler Inhale 2 puffs into the lungs every 6 (six) hours as needed for wheezing or shortness of breath. 10/02/17  Yes Albertine Grates, MD  rosuvastatin (CRESTOR) 10 MG tablet Take 1 tablet (10 mg total) by mouth daily. 04/13/23 07/12/23 Yes Revankar, Aundra Dubin, MD  sodium chloride (OCEAN) 0.65 % SOLN nasal spray Place 1 spray into both nostrils daily as needed for congestion.   Yes [provider]  sodium zirconium cyclosilicate (LOKELMA) 10 g PACK packet Take 10 g by mouth 2 (two) times daily. 04/08/23  Yes Revankar, Aundra Dubin, MD  TRELEGY ELLIPTA 100-62.5-25 MCG/INH AEPB Inhale 1 puff into the lungs daily. 05/08/19  Yes [provider]  zolpidem (AMBIEN) 10 MG tablet Take 10 mg by mouth at bedtime. 03/24/23  Yes [provider]  amoxicillin-clavulanate (AUGMENTIN) 875-125 MG tablet Take 1 tablet by mouth 2 (two) times daily. 04/06/23   [provider]  predniSONE (DELTASONE) 50  MG tablet Prednisone 50 mg - take 13 hours prior to test Take another Prednisone 50 mg 7 hours prior to test Take another Prednisone 50 mg 1 hour prior to test Take Benadryl 50 mg 1 hour prior to test Patient must complete all four doses of above prophylactic medications. Patient will need a ride after test due to Benadryl. 04/07/23   Revankar, Aundra Dubin, MD    Family History Family History  Problem Relation Age of Onset   Heart disease Father    Cancer Neg Hx    Diabetes Neg Hx    Stroke Neg Hx  Hypertension Neg Hx    Kidney disease Neg Hx     Social History Social History   Tobacco Use   Smoking status: Former    Current packs/day: 0.00    Average packs/day: 3.0 packs/day for 43.0 years (129.0 ttl pk-yrs)    Types: Cigarettes    Start date: 10/22/1958    Quit date: 10/22/2001    Years since quitting: 21.5   Smokeless tobacco: Never  Vaping Use   Vaping status: Never Used  Substance Use Topics   Alcohol use: No   Drug use: No     Allergies   Omnipaque [iohexol]   Review of Systems Review of Systems Per HPI  Physical Exam Triage Vital Signs ED Triage Vitals [05/06/23 1304]  Encounter Vitals Group     BP (!) 124/56     Systolic BP Percentile      Diastolic BP Percentile      Pulse Rate 90     Resp 18     Temp 98.3 F (36.8 C)     Temp src      SpO2 (!) 75 %     Weight      Height      Head Circumference      Peak Flow      Pain Score      Pain Loc      Pain Education      Exclude from Growth Chart    No data found.  Updated Vital Signs BP (!) 124/56 (BP Location: Left Arm)   Pulse 90   Temp 98.3 F (36.8 C)   Resp 18   SpO2 96%   Visual Acuity Right Eye Distance:   Left Eye Distance:   Bilateral Distance:    Right Eye Near:   Left Eye Near:    Bilateral Near:     Physical Exam Constitutional:      General: He is not in acute distress.    Appearance: Normal appearance. He is not toxic-appearing or diaphoretic.  HENT:     Head:  Normocephalic and atraumatic.  Eyes:     Extraocular Movements: Extraocular movements intact.     Conjunctiva/sclera: Conjunctivae normal.  Cardiovascular:     Rate and Rhythm: Normal rate and regular rhythm.     Pulses: Normal pulses.     Heart sounds: Normal heart sounds.  Pulmonary:     Effort: Pulmonary effort is normal. No respiratory distress.     Breath sounds: No stridor. Wheezing and rhonchi present. No rales.  Neurological:     General: No focal deficit present.     Mental Status: He is alert and oriented to person, place, and time. Mental status is at baseline.  Psychiatric:        Mood and Affect: Mood normal.        Behavior: Behavior normal.        Thought Content: Thought content normal.        Judgment: Judgment normal.      UC Treatments / Results  Labs (all labs ordered are listed, but only abnormal results are displayed) Labs Reviewed - No data to display  EKG   Radiology No results found.  Procedures Procedures (including critical care time)  Medications Ordered in UC Medications - No data to display  Initial Impression / Assessment and Plan / UC Course  I have reviewed the triage vital signs and the nursing notes.  Pertinent labs & imaging results that were available during my care  of the patient were reviewed by me and considered in my medical decision making (see chart for details).     I was called to physical exam room by clinical staff given that patient's oxygen was 75% on arrival to exam room.  4 L of oxygen were placed on patient with recovery of oxygen to 95%.  Discussed with patient that he would need to go to the emergency department for further evaluation and management given low oxygen saturation and shortness of breath.  He was agreeable with this plan and left via EMS transport. Final Clinical Impressions(s) / UC Diagnoses   Final diagnoses:  Low oxygen saturation  Shortness of breath   Discharge Instructions   None    ED  Prescriptions   None    PDMP not reviewed this encounter.   Gustavus Bryant, [place] 05/06/23 1332

## 2023-05-06 NOTE — ED Notes (Signed)
Paged Dr Alinda Money to report critical magnesium

## 2023-05-06 NOTE — ED Triage Notes (Signed)
Patient BIB EMS from urgent care this afternoon.   SOB x3 days with exertion  HOH  Hx of COPD and lung cancer  UC for evaluation and his stats on RA was 75%. Wears 2 L only at night.   Afib at this moment  136 CBG  3L via [place] at 95%  18g right FA

## 2023-05-06 NOTE — ED Notes (Signed)
Pt requested to have wife called to notify her of discharge to hospital. Spoke with wife and told her about his transfer to [facility] and she will meet him there.

## 2023-05-06 NOTE — ED Notes (Signed)
Paged on call hospitalist number: [phone]

## 2023-05-06 NOTE — ED Triage Notes (Signed)
Pt presents with cough and SOB that started 3 days ago. During triage while doing pt vitals pt oxygen level was 75, told provider who came into room and started pt on oxygen, had to go up to 4L to get oxygen level up to 90. Pt is now at 97 on 4L. Called EMS and will transfer pt to hospital.

## 2023-05-06 NOTE — Progress Notes (Signed)
Patient wants to find out the reason he didn't get his ABT.  Notified Dr. Janalyn Shy and she gave explanation. Patient notified of MD's explanation.

## 2023-05-06 NOTE — ED Provider Notes (Signed)
[place] EMERGENCY DEPARTMENT AT [facility] Provider Note   CSN: [id number] Arrival date & time: 05/06/23  1403     History  Chief Complaint  Patient presents with   Shortness of Breath    [name] is a 72 y.o. male.  Patient is a 72 year old male with a past medical history of COPD on 2 L nasal cannula as needed, diastolic CHF, hypertension, hypothyroidism presenting to the emergency department with shortness of breath.  The patient states over the last few days he has had increasing shortness of breath.  He states that he has had an associated cough productive of mucus.  He denies any fevers.  He denies any chest pain.  States he has had increased lower extremity swelling.  He states that he had a heart cath procedure about 3 weeks ago and was started on Lasix after that and despite taking the Lasix he has had no improvement of his swelling or breathing.  He states that he was seen in urgent care today and was recommended to come to the ER after his oxygen levels were low.  The history is provided by the patient and medical records.  Shortness of Breath      Home Medications Prior to Admission medications   Medication Sig Start Date End Date Taking? Authorizing Provider  albuterol (PROVENTIL) (2.5 MG/3ML) 0.083% nebulizer solution Take 3 mLs (2.5 mg total) by nebulization every 4 (four) hours as needed for wheezing or shortness of breath. 10/02/17   Albertine Grates, MD  allopurinol (ZYLOPRIM) 300 MG tablet Take 300 mg by mouth daily. 01/15/17   [provider]  amLODipine (NORVASC) 5 MG tablet Take 5 mg by mouth daily. 01/21/20   [provider]  amoxicillin-clavulanate (AUGMENTIN) 875-125 MG tablet Take 1 tablet by mouth 2 (two) times daily. 04/06/23   [provider]  apixaban (ELIQUIS) 5 MG TABS tablet Take 1 tablet (5 mg total) by mouth 2 (two) times daily. 02/02/23 01/28/24  Revankar, Aundra Dubin, MD  Buprenorphine HCl-Naloxone HCl 8-2 MG FILM Place  0.5 Film under the tongue daily as needed (back pain). 03/24/23   [provider]  ezetimibe (ZETIA) 10 MG tablet Take 10 mg by mouth daily. 02/09/23   [provider]  finasteride (PROSCAR) 5 MG tablet Take 5 mg by mouth daily. 03/13/19   [provider]  furosemide (LASIX) 20 MG tablet Take 1 tablet (20 mg total) by mouth daily. 04/12/23 04/11/24  Marykay Lex, MD  levothyroxine (SYNTHROID) 175 MCG tablet Take 175 mcg by mouth daily. 10/29/22   [provider]  LINZESS 290 MCG CAPS capsule Take 290 mcg by mouth daily. 01/19/23   [provider]  lisinopril (PRINIVIL,ZESTRIL) 10 MG tablet Take 1 tablet (10 mg total) by mouth daily. 12/29/11   Nahser, Deloris Ping, MD  Melatonin 10 MG CAPS Take 10 mg by mouth at bedtime as needed (sleep).    [provider]  metoprolol succinate (TOPROL XL) 25 MG 24 hr tablet Take 1 tablet (25 mg total) by mouth daily. 02/08/23   Revankar, Aundra Dubin, MD  omeprazole (PRILOSEC) 20 MG capsule Take 1 capsule (20 mg total) by mouth 2 (two) times daily. Patient taking differently: Take 20 mg by mouth daily. 10/13/19   Leslye Peer, MD  predniSONE (DELTASONE) 50 MG tablet Prednisone 50 mg - take 13 hours prior to test Take another Prednisone 50 mg 7 hours prior to test Take another Prednisone 50 mg 1  hour prior to test Take Benadryl 50 mg 1 hour prior to test Patient must complete all four doses of above prophylactic medications. Patient will need a ride after test due to Benadryl. 04/07/23   Revankar, Aundra Dubin, MD  PROAIR HFA [phone] Base) MCG/ACT inhaler Inhale 2 puffs into the lungs every 6 (six) hours as needed for wheezing or shortness of breath. 10/02/17   Albertine Grates, MD  rosuvastatin (CRESTOR) 10 MG tablet Take 1 tablet (10 mg total) by mouth daily. 04/13/23 07/12/23  Revankar, Aundra Dubin, MD  sodium chloride (OCEAN) 0.65 % SOLN nasal spray Place 1 spray into both nostrils daily as needed for congestion.    [provider]   sodium zirconium cyclosilicate (LOKELMA) 10 g PACK packet Take 10 g by mouth 2 (two) times daily. 04/08/23   Revankar, Aundra Dubin, MD  TRELEGY ELLIPTA 100-62.5-25 MCG/INH AEPB Inhale 1 puff into the lungs daily. 05/08/19   [provider]  zolpidem (AMBIEN) 10 MG tablet Take 10 mg by mouth at bedtime. 03/24/23   [provider]      Allergies    Omnipaque [iohexol]    Review of Systems   Review of Systems  Respiratory:  Positive for shortness of breath.     Physical Exam Updated Vital Signs BP 133/69   Pulse 86   Temp 98.2 F (36.8 C) (Oral)   Resp 20   SpO2 97%  Physical Exam Vitals and nursing note reviewed.  Constitutional:      General: He is not in acute distress.    Appearance: He is well-developed.  HENT:     Head: Normocephalic and atraumatic.     Mouth/Throat:     Mouth: Mucous membranes are moist.  Eyes:     Extraocular Movements: Extraocular movements intact.  Cardiovascular:     Rate and Rhythm: Normal rate and regular rhythm.  Pulmonary:     Effort: Pulmonary effort is normal.     Breath sounds: Examination of the right-lower field reveals rales. Examination of the left-lower field reveals rales. Rales present.  Abdominal:     Palpations: Abdomen is soft.  Musculoskeletal:        General: Normal range of motion.     Cervical back: Normal range of motion and neck supple.     Right lower leg: Edema present.     Left lower leg: Edema present.  Skin:    General: Skin is warm and dry.  Neurological:     General: No focal deficit present.     Mental Status: He is alert and oriented to person, place, and time.  Psychiatric:        Mood and Affect: Mood normal.        Behavior: Behavior normal.     ED Results / Procedures / Treatments   Labs (all labs ordered are listed, but only abnormal results are displayed) Labs Reviewed  COMPREHENSIVE METABOLIC PANEL  BRAIN NATRIURETIC PEPTIDE  CBC WITH DIFFERENTIAL/PLATELET  TROPONIN I (HIGH  SENSITIVITY)    EKG None  Radiology No results found.  Procedures Procedures    Medications Ordered in ED Medications - No data to display  ED Course/ Medical Decision Making/ A&P Clinical Course as of 05/06/23 1544  Thu May 06, 2023  1543 Patient signed out to Dr. Charm Barges pending labs and reassessment. [VK]    Clinical Course User Index [VK] Rexford Maus, DO  Medical Decision Making This patient presents to the ED with chief complaint(s) of shortness of breath with pertinent past medical history of COPD on 2L O2 as needed, CHF, HTN which further complicates the presenting complaint. The complaint involves an extensive differential diagnosis and also carries with it a high risk of complications and morbidity.    The differential diagnosis includes ACS, arrhythmia, pulmonary edema, pneumonia, pneumothorax, pleural effusion, no wheezing on exam making COPD exacerbation unlikely  Additional history obtained: Additional history obtained from N/A Records reviewed urgent care records, cardiology records  ED Course and Reassessment: On patient's arrival he is hemodynamically stable in no acute distress was initially satting 99% on 3 L nasal cannula.  Titrated down to 2 L and he remained satting above 96%.  Does have crackles at the bases with edema of his lower extremities concerning for CHF exacerbation.  The patient will have labs including troponin and BNP as well as chest x-ray and he will be closely reassessed.  Independent labs interpretation:  -Pending  Independent visualization of imaging: - I independently visualized the following imaging with scope of interpretation limited to determining acute life threatening conditions related to emergency care: CXR, which revealed possible consolidation vs edema at RLL    Amount and/or Complexity of Data Reviewed Labs: ordered. Radiology: ordered.           Final Clinical  Impression(s) / ED Diagnoses Final diagnoses:  None    Rx / DC Orders ED Discharge Orders     None         Rexford Maus, DO 05/06/23 1544

## 2023-05-06 NOTE — ED Provider Notes (Signed)
Signout from Dr. Theresia Lo.  72 year old male with COPD, lung cancer, A-fib on anticoagulation.  Uses oxygen at night.  He is here with a few days of increased shortness of breath.  At urgent care his saturations were low.  He is pending labs and imaging. Physical Exam  BP 133/69   Pulse 86   Temp 98.2 F (36.8 C) (Oral)   Resp 20   SpO2 97%   Physical Exam  Procedures  Procedures  ED Course / MDM   Clinical Course as of 05/06/23 1712  Thu May 06, 2023  1543 Patient signed out to Dr. Charm Barges pending labs and reassessment. [VK]    Clinical Course User Index [VK] Rexford Maus, DO   Medical Decision Making Amount and/or Complexity of Data Reviewed Labs: ordered. Radiology: ordered.  Risk Prescription drug management.   Workup showing normal white count, hemoglobin lower than priors, platelets low stable from priors.  Chemistries unremarkable and troponin unremarkable.  BNP is mildly elevated.  Chest x-ray showing bibasilar opacities question asymmetric fluid versus infection.  I offered the patient admission for continued treatment versus home and increase his diuretics and starting him on some antibiotics.  He felt he would be better off staying in the hospital and getting treatment.  I have ordered him some antibiotics along with blood cultures and a dose of Lasix.  COVID test pending.  Discussed with [facility] hospitalist who will evaluate patient for admission.       Terrilee Files, MD 05/07/23 1259

## 2023-05-06 NOTE — H&P (Signed)
History and Physical   SHANTANU BENGOCHEA ZOX:[id number] DOB: 06/29/1951 DOA: 05/06/2023  PCP: [name], PA   Patient coming from: Home  Chief Complaint: Shortness of breath  HPI: [name] is a 72 y.o. male with medical history significant of hypertension, hyperlipidemia, GERD, hypothyroidism, COPD, diastolic CHF, chronic respiratory failure intermittently on 2 L, obesity, coronary calcium, gout, lung cancer in remission, non-Hodgkin's lymphoma in remission, BPH, A-fib, depression, polycythemia presenting with shortness of breath.  Patient reports 3 days of worsening shortness of breath.  Reports cough productive of some mucus.  Reports worsening lower extremity edema.  Did have a heart cath 3 weeks ago to workup dyspnea on exertion in the setting of coronary calcification on imaging.  He had a clean cath and was started on Lasix given diastolic CHF and atrial fibrillation.  Despite being on this Lasix patient has developed this worsening shortness of breath.  He is prescribed 2 L of supplemental oxygen as needed which he typically takes only at night.  He presented to the urgent care was noted to be hypoxic and was sent to the ED for further evaluation.  He denies fevers, chills, chest pain, abdominal pain, constipation, diarrhea, nausea, vomiting.  ED Course: Vital signs in the ED stable, continues to require 2 to 4 L to maintain saturations.  Lab workup included CMP with glucose 112, calcium 8.5, protein 5.4, albumin 2.8.  CBC with hemoglobin of 9.6 down from 11 a month ago and 12 2 months ago.  BNP elevated to 440.  Opponent negative.  COVID screen pending.  Lactic acid and blood culture pending.  Chest x-ray showed new small bilateral effusions and patchy basilar airspace opacities consistent with edema versus pneumonia.  Patient received 20 milligrams IV Lasix in the ED.  Ceftriaxone and azithromycin also have been ordered, but have been discontinued.  Review of Systems: As per  HPI otherwise all other systems reviewed and are negative.  Past Medical History:  Diagnosis Date   Adjustment disorder with other symptom 11/27/2015   AKI (acute kidney injury) (HCC) 01/14/2016   Aortic atherosclerosis (HCC) 01/26/2023   Arthritis    Atrial fibrillation (HCC) 10/26/2011   BPH (benign prostatic hyperplasia) 2011   BRONCHOPNEUMONIA ORGANISM UNSPECIFIED 06/12/2010         Replacing diagnoses that were inactivated after the 12/21/22 regulatory import   Carpal tunnel syndrome, right upper limb 11/03/2022   Chronic respiratory failure with hypoxia (HCC) 08/27/2015   Congestive heart failure (HCC)    COPD (chronic obstructive pulmonary disease) (HCC)    COPD, severe (HCC) 11/27/2015   12/18/2015-pulmonary function test- FVC 2.75 (55% predicted), postbronchodilator ratio 56, postbronchodilator FEV1 1.66 (45% predicted), no significant bronchodilator response, mid flow reversibility, DLCO 33  >>>Severe obstructive airways disease, Severe diffusion defect      Coronary artery calcification 01/26/2023   Depression    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 [facility])    Drug-induced neutropenia (HCC) 10/08/2015   Encounter for antineoplastic chemotherapy 10/08/2015   Essential hypertension 06/05/2008   Qualifier: Diagnosis of   By: Truman Hayward Duncan Dull), [name]       Gastrointestinal obstruction (HCC)    Ileus   GERD (gastroesophageal reflux disease)    Gout attack 08/29/2012   Hx of colonoscopy    Hypothyroidism 03/30/2012   Lesion of ulnar nerve, right upper limb 11/03/2022   Lung cancer (HCC) 2011   Lymphadenopathy syndrome 09/12/2015   Mediastinal adenopathy 09/12/2015   NHL (non-Hodgkin's lymphoma) (  HCC) 09/25/2015   Numbness of right hand 06/28/2022   Obesity    Open wound of right hand without foreign body 07/27/2021   Other dysphagia 11/11/2010   Qualifier: Diagnosis of   By: Marchelle Gearing MD, [name]       Polycythemia 01/26/2023   POLYCYTHEMIA  06/05/2008   Qualifier: Diagnosis of   By: Waldo Laine), [facility] catheter in place 08/20/2016   Post-nasal drip 10/04/2011   Primary cancer of right upper lobe of lung (HCC) 10/25/2019   Squamous cell   Pulmonary hypertension (HCC)    Pulmonary nodule, right 10/03/2019   Pure hypercholesterolemia 12/03/2011   Recurrent pneumonia 02/10/2012   Routine general medical examination at a health care facility 12/03/2011   Sepsis (HCC) 01/14/2016   Severe sepsis (HCC) 01/14/2016   Squamous cell carcinoma lung (HCC) 09/26/2010   S/p LUL resection Dr [name] dec 2011      Status post bronchoscopy with biopsy    Stiffness of finger joint of right hand 09/24/2021   Streptococcal pneumonia (HCC) 04/2008   Testicular swelling 09/12/2015    Past Surgical History:  Procedure Laterality Date   APPENDECTOMY     BACK SURGERY     CARDIOVERSION  11/27/2011   Procedure: CARDIOVERSION;  Surgeon: Vesta Mixer, MD;  Location: [facility] ENDOSCOPY;  Service: Cardiovascular;  Laterality: N/A;   COLONOSCOPY     FUDUCIAL PLACEMENT Right 10/13/2019   Procedure: Placement Of Fuducial;  Surgeon: Leslye Peer, MD;  [facility];  Service: Thoracic;  Laterality: Right;  Right Upper Lobe    LEFT HEART CATH AND CORONARY ANGIOGRAPHY N/A 04/12/2023   Procedure: LEFT HEART CATH AND CORONARY ANGIOGRAPHY;  Surgeon: Marykay Lex, MD;  Location: [facility] INVASIVE CV LAB;  Service: Cardiovascular;  Laterality: N/A;   LUNG CANCER SURGERY  2011   UPPER GI ENDOSCOPY     VIDEO BRONCHOSCOPY WITH ENDOBRONCHIAL NAVIGATION N/A 10/13/2019   Procedure: VIDEO BRONCHOSCOPY WITH ENDOBRONCHIAL NAVIGATION;  Surgeon: Leslye Peer, MD;  Location: [facility] OR;  Service: Thoracic;  Laterality: N/A;   VIDEO BRONCHOSCOPY WITH ENDOBRONCHIAL ULTRASOUND N/A 10/13/2019   Procedure: VIDEO BRONCHOSCOPY WITH ENDOBRONCHIAL ULTRASOUND;  Surgeon: Leslye Peer, MD;  Location: [facility] OR;  Service: Thoracic;  Laterality: N/A;    Social History   reports that he quit smoking about 21 years ago. His smoking use included cigarettes. He started smoking about 64 years ago. He has a 129 pack-year smoking history. He has never used smokeless tobacco. He reports that he does not drink alcohol and does not use drugs.  Allergies  Allergen Reactions   Omnipaque [Iohexol] Itching    When asked about contrast media today, pt stated that last time he had the contrast that he was red all over and itchy from head to toe.     Family History  Problem Relation Age of Onset   Heart disease Father    Cancer Neg Hx    Diabetes Neg Hx    Stroke Neg Hx    Hypertension Neg Hx    Kidney disease Neg Hx   Reviewed on admission  Prior to Admission medications   Medication Sig Start Date End Date Taking? Authorizing Provider  albuterol (PROVENTIL) (2.5 MG/3ML) 0.083% nebulizer solution Take 3 mLs (2.5 mg total) by nebulization every 4 (four) hours as needed for wheezing or shortness of breath. 10/02/17   Albertine Grates, MD  allopurinol (ZYLOPRIM) 300 MG tablet Take 300 mg by mouth daily.  01/15/17   [provider]  amLODipine (NORVASC) 5 MG tablet Take 5 mg by mouth daily. 01/21/20   [provider]  amoxicillin-clavulanate (AUGMENTIN) 875-125 MG tablet Take 1 tablet by mouth 2 (two) times daily. 04/06/23   [provider]  apixaban (ELIQUIS) 5 MG TABS tablet Take 1 tablet (5 mg total) by mouth 2 (two) times daily. 02/02/23 01/28/24  Revankar, Aundra Dubin, MD  Buprenorphine HCl-Naloxone HCl 8-2 MG FILM Place 0.5 Film under the tongue daily as needed (back pain). 03/24/23   [provider]  ezetimibe (ZETIA) 10 MG tablet Take 10 mg by mouth daily. 02/09/23   [provider]  finasteride (PROSCAR) 5 MG tablet Take 5 mg by mouth daily. 03/13/19   [provider]  furosemide (LASIX) 20 MG tablet Take 1 tablet (20 mg total) by mouth daily. 04/12/23 04/11/24  Marykay Lex, MD  levothyroxine (SYNTHROID) 175 MCG tablet Take 175 mcg  by mouth daily. 10/29/22   [provider]  LINZESS 290 MCG CAPS capsule Take 290 mcg by mouth daily. 01/19/23   [provider]  lisinopril (PRINIVIL,ZESTRIL) 10 MG tablet Take 1 tablet (10 mg total) by mouth daily. 12/29/11   Nahser, Deloris Ping, MD  Melatonin 10 MG CAPS Take 10 mg by mouth at bedtime as needed (sleep).    [provider]  metoprolol succinate (TOPROL XL) 25 MG 24 hr tablet Take 1 tablet (25 mg total) by mouth daily. 02/08/23   Revankar, Aundra Dubin, MD  omeprazole (PRILOSEC) 20 MG capsule Take 1 capsule (20 mg total) by mouth 2 (two) times daily. Patient taking differently: Take 20 mg by mouth daily. 10/13/19   Leslye Peer, MD  predniSONE (DELTASONE) 50 MG tablet Prednisone 50 mg - take 13 hours prior to test Take another Prednisone 50 mg 7 hours prior to test Take another Prednisone 50 mg 1 hour prior to test Take Benadryl 50 mg 1 hour prior to test Patient must complete all four doses of above prophylactic medications. Patient will need a ride after test due to Benadryl. 04/07/23   Revankar, Aundra Dubin, MD  PROAIR HFA 108 [phone] Base) MCG/ACT inhaler Inhale 2 puffs into the lungs every 6 (six) hours as needed for wheezing or shortness of breath. 10/02/17   Albertine Grates, MD  rosuvastatin (CRESTOR) 10 MG tablet Take 1 tablet (10 mg total) by mouth daily. 04/13/23 07/12/23  Revankar, Aundra Dubin, MD  sodium chloride (OCEAN) 0.65 % SOLN nasal spray Place 1 spray into both nostrils daily as needed for congestion.    [provider]  sodium zirconium cyclosilicate (LOKELMA) 10 g PACK packet Take 10 g by mouth 2 (two) times daily. 04/08/23   Revankar, Aundra Dubin, MD  TRELEGY ELLIPTA 100-62.5-25 MCG/INH AEPB Inhale 1 puff into the lungs daily. 05/08/19   [provider]  zolpidem (AMBIEN) 10 MG tablet Take 10 mg by mouth at bedtime. 03/24/23   [provider]    Physical Exam: Vitals:   05/06/23 1500 05/06/23 1522 05/06/23 1802 05/06/23 1805  BP: 133/69   (!)  141/70  Pulse: 86   96  Resp: 20   17  Temp:  98.2 F (36.8 C) 97.9 F (36.6 C)   TempSrc:  Oral Oral   SpO2: 97%   97%    Physical Exam Constitutional:      General: He is not in acute distress.    Appearance: Normal appearance.  HENT:     Head: Normocephalic  and atraumatic.     Mouth/Throat:     Mouth: Mucous membranes are moist.     Pharynx: Oropharynx is clear.  Eyes:     Extraocular Movements: Extraocular movements intact.     Pupils: Pupils are equal, round, and reactive to light.  Cardiovascular:     Rate and Rhythm: Normal rate and regular rhythm.     Pulses: Normal pulses.     Heart sounds: Normal heart sounds.  Pulmonary:     Effort: Pulmonary effort is normal. No respiratory distress.     Breath sounds: Rales present.  Abdominal:     General: Bowel sounds are normal. There is no distension.     Palpations: Abdomen is soft.     Tenderness: There is no abdominal tenderness.  Musculoskeletal:        General: No swelling or deformity.     Right lower leg: Edema present.     Left lower leg: Edema present.  Skin:    General: Skin is warm and dry.  Neurological:     General: No focal deficit present.     Mental Status: Mental status is at baseline.    Labs on Admission: I have personally reviewed following labs and imaging studies  CBC: Recent Labs  Lab 05/06/23 1603  WBC 8.9  NEUTROABS 7.2  HGB 9.6*  HCT 31.0*  MCV 90.6  PLT 218    Basic Metabolic Panel: Recent Labs  Lab 05/06/23 1603  NA 139  K 4.1  CL 99  CO2 29  GLUCOSE 112*  BUN 19  CREATININE 1.23  CALCIUM 8.5*    GFR: CrCl cannot be calculated (Unknown ideal weight.).  Liver Function Tests: Recent Labs  Lab 05/06/23 1603  AST 14*  ALT 15  ALKPHOS 69  BILITOT 0.9  PROT 5.4*  ALBUMIN 2.8*    Urine analysis:    Component Value Date/Time   COLORURINE YELLOW 02/12/2023 1002   APPEARANCEUR CLEAR 02/12/2023 1002   LABSPEC 1.013 02/12/2023 1002   PHURINE 5.0 02/12/2023  1002   GLUCOSEU NEGATIVE 02/12/2023 1002   GLUCOSEU NEGATIVE 12/03/2011 1024   HGBUR NEGATIVE 02/12/2023 1002   BILIRUBINUR NEGATIVE 02/12/2023 1002   KETONESUR NEGATIVE 02/12/2023 1002   PROTEINUR NEGATIVE 02/12/2023 1002   UROBILINOGEN 0.2 12/03/2011 1024   NITRITE NEGATIVE 02/12/2023 1002   LEUKOCYTESUR NEGATIVE 02/12/2023 1002    Radiological Exams on Admission: DG Chest 2 View  Result Date: 05/06/2023 CLINICAL DATA:  Shortness of breath. EXAM: CHEST - 2 VIEW COMPARISON:  Radiographs 02/12/2023 and 01/23/2023.  CT 01/23/2023. FINDINGS: The frontal examination was repeated. Right IJ Port-A-Cath tip is unchanged, extending to the level of the superior cavoatrial junction. The heart size and mediastinal contours are stable. There is stable chronic right upper lobe consolidation with volume loss attributed to prior radiation therapy for lung cancer. There are new small bilateral pleural effusions with extension of fluid into the fissures, best seen on the lateral view. There are new patchy airspace opacities at both lung bases which could be secondary to bronchopneumonia or asymmetric edema. No evidence of pneumothorax or acute osseous abnormality. IMPRESSION: New small bilateral pleural effusions and patchy bibasilar airspace opacities which could be secondary to bronchopneumonia or asymmetric edema. Electronically Signed   By: Carey Bullocks M.D.   On: 05/06/2023 17:06    EKG: Independently reviewed.  Atrial fibrillation at 90 bpm.  Assessment/Plan Active Problems:   Essential hypertension   COPD (chronic obstructive pulmonary disease) (HCC)   Squamous cell  carcinoma lung (HCC)   Atrial fibrillation (HCC)   Depression   Pure hypercholesterolemia   Hypothyroidism   Chronic respiratory failure with hypoxia (HCC)   NHL (non-Hodgkin's lymphoma) (HCC)   Congestive heart failure (HCC)   GERD (gastroesophageal reflux disease)   Obesity   Polycythemia   Acute on chronic diastolic CHF  (congestive heart failure) (HCC)   Acute on chronic respiratory failure with hypoxia (HCC)   Acute on chronic respiratory failure with hypoxia Acute on chronic diastolic CHF > Patient presenting with worsening short of breath for the past several days.  Has had a cough with some sputum production.  Also reporting worsening lower extremity edema. > Exam notable for rales and edema.  BNP elevated to 440. > Last echo was earlier this year while being worked up for dyspnea on exertion and it showed EF 60-65%, indeterminate diastolic function, normal RV function.  Subsequently had cath a few weeks ago which was clean and with coronary disease ruled out patient was started on Lasix but developed this worsening shortness of breath despite taking the Lasix as prescribed. > Chest x-ray with new small bilateral effusions and patchy basilar airspace opacities consistent with edema versus pneumonia. > Patient ordered 20 mg IV Lasix in the ED.  Ceftriaxone and azithromycin were also ordered but have been discontinued for now with CHF exacerbation being more likely with lack of leukocytosis, however we will check procalcitonin and follow-up COVID screen. - Monitor on telemetry - Continue with supplemental oxygen, wean as tolerated - Hold off on antibiotics - Continue with Lasix 40 mg IV twice daily - Strict I's and O's, daily weights - Hold off on repeat echocardiogram as this was recently done - Check magnesium - Trend renal function and electrolytes - Trend fever curve and WBC - Follow-up COVID screen - Follow-up procalcitonin - Continue home lisinopril, metoprolol  Acute on chronic anemia > Hemoglobin noted to be 9.6.  Normocytic.  Hemoglobin was 11 a month ago and 12 2 months ago.  Given concern for CHF exacerbation may be primarily delusional. - Continue to trend CBC  Hypertension - Continue home amlodipine, lisinopril, metoprolol - On Lasix as above  Hyperlipidemia - Continue home Zetia and  rosuvastatin  GERD - Continue PPI  Hypothyroidism - Continue home Synthroid  COPD - Replace home Trelegy with formulary Breo and Incruse - Continue as needed albuterol  Atrial fibrillation - Continue home metoprolol and Eliquis  Gout - Continue home allopurinol  BPH - Continue home finasteride  History of lung cancer status post radiation in remission History of non-Hodgkin lymphoma status post R-CHOP in remission - Follows with oncology  DVT prophylaxis: Eliquis Code Status:   Full Family Communication:  None on admission  Disposition Plan:   Patient is from:  Home  Anticipated DC to:  Home  Anticipated DC date:  1 to 3 days  Anticipated DC barriers: None  Consults called:  None Admission status:  Observation, telemetry  Severity of Illness: The appropriate patient status for this patient is OBSERVATION. Observation status is judged to be reasonable and necessary in order to provide the required intensity of service to ensure the patient's safety. The patient's presenting symptoms, physical exam findings, and initial radiographic and laboratory data in the context of their medical condition is felt to place them at decreased risk for further clinical deterioration. Furthermore, it is anticipated that the patient will be medically stable for discharge from the hospital within 2 midnights of admission.    Lyn Hollingshead  Elgie Collard MD [facility]  How to contact the [facility] Attending or Consulting provider 7A - 7P or covering provider during after hours 7P -7A, for this patient?   Check the care team in [facility] and look for a) attending/consulting TRH provider listed and b) the [facility] team listed Log into [email or web address] and use [facility]'s universal password to access. If you do not have the password, please contact the hospital operator. Locate the [facility] provider you are looking for under [facility] and page to a number that you can be directly reached. If you still have  difficulty reaching the provider, please page the [facility] (Director on Call) for the Hospitalists listed on amion for assistance.  05/06/2023, 6:13 PM

## 2023-05-06 NOTE — ED Notes (Signed)
Patient is being discharged from the Urgent Care and sent to the Emergency Department via EMS . Per Rolly Salter, FNP, patient is in need of higher level of care due to low oxygen sats. Patient is aware and verbalizes understanding of plan of care.  Vitals:   05/06/23 1304 05/06/23 1305  BP: (!) 124/56   Pulse: 90   Resp: 18   Temp: 98.3 F (36.8 C)   SpO2: (!) 75% 96%

## 2023-05-07 DIAGNOSIS — J449 Chronic obstructive pulmonary disease, unspecified: Secondary | ICD-10-CM | POA: Diagnosis not present

## 2023-05-07 DIAGNOSIS — Z8249 Family history of ischemic heart disease and other diseases of the circulatory system: Secondary | ICD-10-CM | POA: Diagnosis not present

## 2023-05-07 DIAGNOSIS — I110 Hypertensive heart disease with heart failure: Secondary | ICD-10-CM | POA: Diagnosis present

## 2023-05-07 DIAGNOSIS — N179 Acute kidney failure, unspecified: Secondary | ICD-10-CM | POA: Diagnosis present

## 2023-05-07 DIAGNOSIS — Z7901 Long term (current) use of anticoagulants: Secondary | ICD-10-CM | POA: Diagnosis not present

## 2023-05-07 DIAGNOSIS — Z79890 Hormone replacement therapy (postmenopausal): Secondary | ICD-10-CM | POA: Diagnosis not present

## 2023-05-07 DIAGNOSIS — D649 Anemia, unspecified: Secondary | ICD-10-CM | POA: Diagnosis present

## 2023-05-07 DIAGNOSIS — J9621 Acute and chronic respiratory failure with hypoxia: Secondary | ICD-10-CM | POA: Diagnosis present

## 2023-05-07 DIAGNOSIS — Z87891 Personal history of nicotine dependence: Secondary | ICD-10-CM | POA: Diagnosis not present

## 2023-05-07 DIAGNOSIS — I272 Pulmonary hypertension, unspecified: Secondary | ICD-10-CM | POA: Diagnosis present

## 2023-05-07 DIAGNOSIS — Z1152 Encounter for screening for COVID-19: Secondary | ICD-10-CM | POA: Diagnosis not present

## 2023-05-07 DIAGNOSIS — J441 Chronic obstructive pulmonary disease with (acute) exacerbation: Secondary | ICD-10-CM | POA: Diagnosis present

## 2023-05-07 DIAGNOSIS — I5033 Acute on chronic diastolic (congestive) heart failure: Secondary | ICD-10-CM | POA: Diagnosis present

## 2023-05-07 DIAGNOSIS — I5031 Acute diastolic (congestive) heart failure: Secondary | ICD-10-CM | POA: Insufficient documentation

## 2023-05-07 DIAGNOSIS — E039 Hypothyroidism, unspecified: Secondary | ICD-10-CM | POA: Diagnosis present

## 2023-05-07 DIAGNOSIS — I5032 Chronic diastolic (congestive) heart failure: Secondary | ICD-10-CM | POA: Diagnosis present

## 2023-05-07 DIAGNOSIS — K219 Gastro-esophageal reflux disease without esophagitis: Secondary | ICD-10-CM | POA: Diagnosis present

## 2023-05-07 DIAGNOSIS — I4891 Unspecified atrial fibrillation: Secondary | ICD-10-CM | POA: Diagnosis present

## 2023-05-07 DIAGNOSIS — J9611 Chronic respiratory failure with hypoxia: Secondary | ICD-10-CM | POA: Diagnosis not present

## 2023-05-07 DIAGNOSIS — M109 Gout, unspecified: Secondary | ICD-10-CM | POA: Diagnosis present

## 2023-05-07 DIAGNOSIS — E7800 Pure hypercholesterolemia, unspecified: Secondary | ICD-10-CM | POA: Diagnosis present

## 2023-05-07 DIAGNOSIS — F32A Depression, unspecified: Secondary | ICD-10-CM | POA: Diagnosis present

## 2023-05-07 DIAGNOSIS — Z923 Personal history of irradiation: Secondary | ICD-10-CM | POA: Diagnosis not present

## 2023-05-07 DIAGNOSIS — I10 Essential (primary) hypertension: Secondary | ICD-10-CM | POA: Diagnosis not present

## 2023-05-07 DIAGNOSIS — D751 Secondary polycythemia: Secondary | ICD-10-CM | POA: Diagnosis present

## 2023-05-07 DIAGNOSIS — E669 Obesity, unspecified: Secondary | ICD-10-CM | POA: Diagnosis present

## 2023-05-07 DIAGNOSIS — N400 Enlarged prostate without lower urinary tract symptoms: Secondary | ICD-10-CM | POA: Diagnosis present

## 2023-05-07 DIAGNOSIS — Z79899 Other long term (current) drug therapy: Secondary | ICD-10-CM | POA: Diagnosis not present

## 2023-05-07 HISTORY — DX: Acute diastolic (congestive) heart failure: I50.31

## 2023-05-07 HISTORY — DX: Chronic diastolic (congestive) heart failure: I50.32

## 2023-05-07 LAB — CBC
HCT: 34.2 % — ABNORMAL LOW (ref 39.0–52.0)
Hemoglobin: 10.4 g/dL — ABNORMAL LOW (ref 13.0–17.0)
MCH: 27.4 pg (ref 26.0–34.0)
MCHC: 30.4 g/dL (ref 30.0–36.0)
MCV: 90.2 fL (ref 80.0–100.0)
Platelets: 230 10*3/uL (ref 150–400)
RBC: 3.79 MIL/uL — ABNORMAL LOW (ref 4.22–5.81)
RDW: 13.8 % (ref 11.5–15.5)
WBC: 7 10*3/uL (ref 4.0–10.5)
nRBC: 0 % (ref 0.0–0.2)

## 2023-05-07 LAB — COMPREHENSIVE METABOLIC PANEL
ALT: 13 U/L (ref 0–44)
AST: 12 U/L — ABNORMAL LOW (ref 15–41)
Albumin: 2.8 g/dL — ABNORMAL LOW (ref 3.5–5.0)
Alkaline Phosphatase: 77 U/L (ref 38–126)
Anion gap: 6 (ref 5–15)
BUN: 17 mg/dL (ref 8–23)
CO2: 33 mmol/L — ABNORMAL HIGH (ref 22–32)
Calcium: 8.8 mg/dL — ABNORMAL LOW (ref 8.9–10.3)
Chloride: 99 mmol/L (ref 98–111)
Creatinine, Ser: 1.01 mg/dL (ref 0.61–1.24)
GFR, Estimated: >60 mL/min (ref >60)
Glucose, Bld: 127 mg/dL — ABNORMAL HIGH (ref 70–99)
Potassium: 4.8 mmol/L (ref 3.5–5.1)
Sodium: 138 mmol/L (ref 135–145)
Total Bilirubin: 0.8 mg/dL (ref 0.3–1.2)
Total Protein: 5.5 g/dL — ABNORMAL LOW (ref 6.5–8.1)

## 2023-05-07 LAB — MAGNESIUM: Magnesium: 1.6 mg/dL — ABNORMAL LOW (ref 1.7–2.4)

## 2023-05-07 LAB — PROCALCITONIN: Procalcitonin: <0.1 ng/mL

## 2023-05-07 MED ORDER — METHYLPREDNISOLONE SODIUM SUCC 40 MG IJ SOLR
40.0000 mg | Freq: Two times a day (BID) | INTRAMUSCULAR | Status: DC
  Administered 2023-05-07 – 2023-05-10 (×7): 40 mg via INTRAVENOUS
  Filled 2023-05-07 (×7): qty 1

## 2023-05-07 MED ORDER — FUROSEMIDE 40 MG PO TABS
40.0000 mg | ORAL_TABLET | Freq: Every day | ORAL | Status: DC
  Administered 2023-05-07 – 2023-05-08 (×2): 40 mg via ORAL
  Filled 2023-05-07 (×2): qty 1

## 2023-05-07 MED ORDER — MAGNESIUM SULFATE 2 GM/50ML IV SOLN
2.0000 g | Freq: Once | INTRAVENOUS | Status: AC
  Administered 2023-05-07: 2 g via INTRAVENOUS
  Filled 2023-05-07: qty 50

## 2023-05-07 MED ORDER — GUAIFENESIN ER 600 MG PO TB12
1200.0000 mg | ORAL_TABLET | Freq: Two times a day (BID) | ORAL | Status: DC
  Administered 2023-05-07 – 2023-05-10 (×7): 1200 mg via ORAL
  Filled 2023-05-07 (×7): qty 2

## 2023-05-07 MED ORDER — IPRATROPIUM-ALBUTEROL 0.5-2.5 (3) MG/3ML IN SOLN
3.0000 mL | Freq: Four times a day (QID) | RESPIRATORY_TRACT | Status: DC
  Administered 2023-05-07 – 2023-05-10 (×13): 3 mL via RESPIRATORY_TRACT
  Filled 2023-05-07 (×13): qty 3

## 2023-05-07 NOTE — TOC Initial Note (Addendum)
Transition of Care [facility]) - Initial/Assessment Note    Patient Details  Name: [name] MRN: [id number] Date of Birth: [birth year]  [facility] CM/SW Contact:    Leone Haven, RN Phone Number: 05/07/2023, 12:11 PM  Clinical Narrative:                 From home with spouse, has PCP and insurance on file, wife at bedside  states  he has no HH services in place at this time ,he does have home oxygen 2 liters with [employer]. Wife states he does not need any HH at this time.    States she will transport him home at [employer] and she and his daughter is support system, states gets medications from [facility] in [place].  Pta self ambulatory   Expected Discharge Plan: Home/Self Care Barriers to Discharge: Continued Medical Work up   Patient Goals and CMS Choice Patient states their goals for this hospitalization and ongoing recovery are:: return home   Choice offered to / list presented to : NA      Expected Discharge Plan and Services In-house Referral: NA Discharge Planning Services: CM Consult Post Acute Care Choice: NA Living arrangements for the past 2 months: Single Family Home                 DME Arranged: N/A DME Agency: NA       HH Arranged: NA          Prior Living Arrangements/Services Living arrangements for the past 2 months: Single Family Home Lives with:: Spouse Patient language and need for interpreter reviewed:: Yes Do you feel safe going back to the place where you live?: Yes      Need for Family Participation in Patient Care: Yes (Comment) Care giver support system in place?: Yes (comment) Current home services: DME (home oxygen with [employer] 2 liters)    Activities of Daily Living Home Assistive Devices/Equipment: Nebulizer, Oxygen ADL Screening (condition at time of admission) Patient's cognitive ability adequate to safely complete daily activities?: Yes Is the patient deaf or have difficulty hearing?: Yes (on left ear) Does the  patient have difficulty seeing, even when wearing glasses/contacts?: No Does the patient have difficulty concentrating, remembering, or making decisions?: No Patient able to express need for assistance with ADLs?: Yes Does the patient have difficulty dressing or bathing?: No Independently performs ADLs?: Yes (appropriate for developmental age) Does the patient have difficulty walking or climbing stairs?: No Weakness of Legs: None Weakness of Arms/Hands: None  Permission Sought/Granted                  Emotional Assessment       Orientation: : Oriented to Self, Oriented to Place, Oriented to  Time, Oriented to Situation Alcohol / Substance Use: Not Applicable Psych Involvement: No (comment)  Admission diagnosis:  Acute on chronic respiratory failure with hypoxia (HCC) [J96.21] Patient Active Problem List   Diagnosis Date Noted   Acute on chronic diastolic CHF (congestive heart failure) (HCC) 05/06/2023   Acute on chronic respiratory failure with hypoxia (HCC) 05/06/2023   DOE (dyspnea on exertion) 04/07/2023   Arthritis 01/26/2023   Congestive heart failure (HCC) 01/26/2023   GERD (gastroesophageal reflux disease) 01/26/2023   Obesity 01/26/2023   Polycythemia 01/26/2023   Pulmonary hypertension (HCC) 01/26/2023   Aortic atherosclerosis (HCC) 01/26/2023   Coronary artery calcification 01/26/2023   Carpal tunnel syndrome, right upper limb 11/03/2022   Lesion of ulnar nerve, right upper limb  11/03/2022   Numbness of right hand 06/28/2022   Stiffness of finger joint of right hand 09/24/2021   Open wound of right hand without foreign body 07/27/2021   Status post bronchoscopy with biopsy    Port catheter in place 08/20/2016   NHL (non-Hodgkin's lymphoma) (HCC) 09/25/2015   Lymphadenopathy syndrome 09/12/2015   Mediastinal adenopathy 09/12/2015   Testicular swelling 09/12/2015   Chronic respiratory failure with hypoxia (HCC) 08/27/2015   Hypothyroidism 03/30/2012    Recurrent pneumonia 02/10/2012   Routine general medical examination at a health care facility 12/03/2011   Depression 12/03/2011   Pure hypercholesterolemia 12/03/2011   Atrial fibrillation (HCC) 10/26/2011   Other dysphagia 11/11/2010   Squamous cell carcinoma lung (HCC) 09/26/2010   BRONCHOPNEUMONIA ORGANISM UNSPECIFIED 06/12/2010   BPH (benign prostatic hyperplasia) 2011   ERECTILE DYSFUNCTION 09/24/2008   Essential hypertension 06/05/2008   COPD (chronic obstructive pulmonary disease) (HCC) 06/05/2008   PCP:  [name], PA Pharmacy:   [facility] [address], [place] - [address] [facility] [place] [address] Phone: [phone] Fax: [phone]     Social Determinants of Health (SDOH) Social History: SDOH Screenings   Food Insecurity: No Food Insecurity (05/06/2023)  Housing: Patient Declined (05/06/2023)  Transportation Needs: No Transportation Needs (05/06/2023)  Utilities: Not At Risk (05/06/2023)  Tobacco Use: Medium Risk (05/06/2023)   SDOH Interventions:     Readmission Risk Interventions     No data to display

## 2023-05-07 NOTE — Plan of Care (Signed)
  Problem: Education: Goal: Understanding of CV disease, CV risk reduction, and recovery process will improve Outcome: Progressing Goal: Individualized Educational Video(s) Outcome: Progressing   Problem: Activity: Goal: Ability to return to baseline activity level will improve Outcome: Progressing   Problem: Cardiovascular: Goal: Ability to achieve and maintain adequate cardiovascular perfusion will improve Outcome: Progressing Goal: Vascular access site(s) Level 0-1 will be maintained Outcome: Progressing   Problem: Health Behavior/Discharge Planning: Goal: Ability to safely manage health-related needs after discharge will improve Outcome: Progressing   Problem: Education: Goal: Ability to demonstrate management of disease process will improve Outcome: Progressing Goal: Ability to verbalize understanding of medication therapies will improve Outcome: Progressing Goal: Individualized Educational Video(s) Outcome: Progressing   Problem: Activity: Goal: Capacity to carry out activities will improve Outcome: Progressing   Problem: Cardiac: Goal: Ability to achieve and maintain adequate cardiopulmonary perfusion will improve Outcome: Progressing   Problem: Education: Goal: Knowledge of General Education information will improve Description: Including pain rating scale, medication(s)/side effects and non-pharmacologic comfort measures Outcome: Progressing   Problem: Health Behavior/Discharge Planning: Goal: Ability to manage health-related needs will improve Outcome: Progressing   Problem: Clinical Measurements: Goal: Ability to maintain clinical measurements within normal limits will improve Outcome: Progressing Goal: Will remain free from infection Outcome: Progressing Goal: Diagnostic test results will improve Outcome: Progressing Goal: Respiratory complications will improve Outcome: Progressing Goal: Cardiovascular complication will be avoided Outcome:  Progressing   Problem: Activity: Goal: Risk for activity intolerance will decrease Outcome: Progressing   Problem: Nutrition: Goal: Adequate nutrition will be maintained Outcome: Progressing   Problem: Coping: Goal: Level of anxiety will decrease Outcome: Progressing   Problem: Elimination: Goal: Will not experience complications related to bowel motility Outcome: Progressing Goal: Will not experience complications related to urinary retention Outcome: Progressing   Problem: Pain Managment: Goal: General experience of comfort will improve Outcome: Progressing   Problem: Safety: Goal: Ability to remain free from injury will improve Outcome: Progressing   Problem: Skin Integrity: Goal: Risk for impaired skin integrity will decrease Outcome: Progressing   

## 2023-05-07 NOTE — Progress Notes (Addendum)
10:11Attempted to reach phlebomist at [phone]- no answer. Contacted lab. Was provided with secondary number [phone]. No answer.   10:20: Received incoming call from phlebotomist- informed of magnesium lab order that need to to be collected.

## 2023-05-07 NOTE — Progress Notes (Signed)
[facility]  PROGRESS NOTE  [name] [id number] DOB: 02/22/1951 DOA: 05/06/2023 PCP: [name], PA   Brief HPI:   72 y.o. male with medical history significant of hypertension, hyperlipidemia, GERD, hypothyroidism, COPD, diastolic CHF, chronic respiratory failure intermittently on 2 L, obesity, coronary calcium, gout, lung cancer in remission, non-Hodgkin's lymphoma in remission, BPH, A-fib, depression, polycythemia presented  with shortness of breath.  He did have a heart cath 3 weeks ago to workup dyspnea on exertion in the setting of coronary calcification on imaging. He had a clean cath and was started on Lasix given diastolic CHF and atrial fibrillation.   Chest x-ray showed new small bilateral effusions and patchy basilar airspace opacities consistent with edema versus pneumonia.  Patient received 20 milligrams IV Lasix in the ED     Assessment/Plan:   Acute on chronic diastolic CHF -Diuresed well with IV Lasix -Will discontinue IV Lasix and start Lasix 40 mg p.o. daily  Acute on chronic anemia -Hemoglobin was 9.6, improved to 10.4 after diuresis -Monitor hemoglobin intermittently    Hypertension - Continue home amlodipine, lisinopril, metoprolol - On Lasix as above  Hypomagnesemia -Magnesium this morning is 1.6 -Replace magnesium and check mag level in a.m.   Hyperlipidemia - Continue home Zetia and rosuvastatin   GERD - Continue PPI   Hypothyroidism - Continue home Synthroid   COPD -Has been coughing up phlegm this morning -Start Solu-Medrol 40 mg IV every 12 hours -DuoNeb nebulizers every 6 hours -Mucinex 1200 mg p.o. twice daily   Atrial fibrillation - Continue home metoprolol and Eliquis   Gout - Continue home allopurinol   BPH - Continue home finasteride   History of lung cancer status post radiation in remission History of non-Hodgkin lymphoma status post R-CHOP in remission - Follows with oncology   Medications      allopurinol  300 mg Oral Daily   amLODipine  5 mg Oral Daily   apixaban  5 mg Oral BID   ezetimibe  10 mg Oral Daily   finasteride  5 mg Oral Daily   fluticasone furoate-vilanterol  1 puff Inhalation Daily   And   umeclidinium bromide  1 puff Inhalation Daily   furosemide  40 mg Intravenous BID   levothyroxine  175 mcg Oral Daily   lisinopril  10 mg Oral Daily   metoprolol succinate  25 mg Oral Daily   pantoprazole  40 mg Oral Daily   rosuvastatin  10 mg Oral Daily   sodium chloride flush  3 mL Intravenous Q12H     Data Reviewed:   CBG:  No results for input(s): "GLUCAP" in the last 168 hours.  SpO2: 94 % O2 Flow Rate (L/min): 1 L/min    Vitals:   05/07/23 0400 05/07/23 0500 05/07/23 0751 05/07/23 0841  BP: (!) 128/58  131/72   Pulse: 74 76 86   Resp: 20  18   Temp: 98.1 F (36.7 C)  98.5 F (36.9 C)   TempSrc: Oral  Oral   SpO2: 98% 94% 96% 94%  Weight: 92 kg     Height:          Data Reviewed:  Basic Metabolic Panel: Recent Labs  Lab 05/06/23 1603 05/06/23 1814 05/07/23 0143  NA 139  --  138  K 4.1  --  4.8  CL 99  --  99  CO2 29  --  33*  GLUCOSE 112*  --  127*  BUN 19  --  17  CREATININE 1.23  --  1.01  CALCIUM 8.5*  --  8.8*  MG  --  0.9*  --     CBC: Recent Labs  Lab 05/06/23 1603 05/07/23 0143  WBC 8.9 7.0  NEUTROABS 7.2  --   HGB 9.6* 10.4*  HCT 31.0* 34.2*  MCV 90.6 90.2  PLT 218 230    LFT Recent Labs  Lab 05/06/23 1603 05/07/23 0143  AST 14* 12*  ALT 15 13  ALKPHOS 69 77  BILITOT 0.9 0.8  PROT 5.4* 5.5*  ALBUMIN 2.8* 2.8*     Antibiotics: Anti-infectives (From admission, onward)    Start     Dose/Rate Route Frequency Ordered Stop   05/06/23 1745  cefTRIAXone (ROCEPHIN) 1 g in sodium chloride 0.9 % 100 mL IVPB  Status:  Discontinued        1 g 200 mL/hr over 30 Minutes Intravenous  Once 05/06/23 1737 05/06/23 1811   05/06/23 1745  azithromycin (ZITHROMAX) 500 mg in sodium chloride 0.9 % 250 mL IVPB  Status:   Discontinued        500 mg 250 mL/hr over 60 Minutes Intravenous  Once 05/06/23 1737 05/06/23 1811        DVT prophylaxis: Apixaban  Code Status: Full code  Family Communication:    CONSULTS    Subjective   Coughing up phlegm today.  His oxygen at home for COPD.  Breathing has improved   Objective    Physical Examination:   General-appears in no acute distress Heart-S1-S2, regular, no murmur auscultated Lungs-bilateral rhonchi auscultated Abdomen-soft, nontender, no organomegaly Extremities-no edema in the lower extremities Neuro-alert, oriented x3, no focal deficit noted  Status is: Inpatient:             Meredeth Ide   [facility] If 7PM-7AM, please contact night-coverage at [email or web address], Office  [phone]   05/07/2023, 9:10 AM  LOS: 0 days

## 2023-05-07 NOTE — Progress Notes (Signed)
Received secure chat message from hospitalist for lab to be contacted to collect magnesium. Attempted to reach [facility] [place] phlebotomist [phone])- no answer.

## 2023-05-07 NOTE — Plan of Care (Signed)
  Problem: Activity: Goal: Ability to return to baseline activity level will improve Outcome: Progressing   Problem: Education: Goal: Ability to demonstrate management of disease process will improve Outcome: Progressing   Problem: Activity: Goal: Capacity to carry out activities will improve Outcome: Progressing   Problem: Clinical Measurements: Goal: Ability to maintain clinical measurements within normal limits will improve Outcome: Progressing Goal: Will remain free from infection Outcome: Progressing Goal: Respiratory complications will improve Outcome: Progressing   Problem: Activity: Goal: Risk for activity intolerance will decrease Outcome: Progressing   Problem: Elimination: Goal: Will not experience complications related to bowel motility Outcome: Progressing   Problem: Pain Managment: Goal: General experience of comfort will improve Outcome: Progressing

## 2023-05-08 DIAGNOSIS — I5031 Acute diastolic (congestive) heart failure: Secondary | ICD-10-CM | POA: Diagnosis not present

## 2023-05-08 DIAGNOSIS — I5033 Acute on chronic diastolic (congestive) heart failure: Secondary | ICD-10-CM | POA: Diagnosis not present

## 2023-05-08 DIAGNOSIS — J9621 Acute and chronic respiratory failure with hypoxia: Secondary | ICD-10-CM | POA: Diagnosis not present

## 2023-05-08 DIAGNOSIS — E039 Hypothyroidism, unspecified: Secondary | ICD-10-CM | POA: Diagnosis not present

## 2023-05-08 LAB — BASIC METABOLIC PANEL
Anion gap: 9 (ref 5–15)
BUN: 23 mg/dL (ref 8–23)
CO2: 32 mmol/L (ref 22–32)
Calcium: 8.8 mg/dL — ABNORMAL LOW (ref 8.9–10.3)
Chloride: 95 mmol/L — ABNORMAL LOW (ref 98–111)
Creatinine, Ser: 1.06 mg/dL (ref 0.61–1.24)
GFR, Estimated: >60 mL/min (ref >60)
Glucose, Bld: 179 mg/dL — ABNORMAL HIGH (ref 70–99)
Potassium: 4.1 mmol/L (ref 3.5–5.1)
Sodium: 136 mmol/L (ref 135–145)

## 2023-05-08 LAB — MAGNESIUM: Magnesium: 1.8 mg/dL (ref 1.7–2.4)

## 2023-05-08 MED ORDER — ONDANSETRON HCL 4 MG/2ML IJ SOLN
4.0000 mg | Freq: Four times a day (QID) | INTRAMUSCULAR | Status: DC | PRN
  Administered 2023-05-08 – 2023-05-10 (×5): 4 mg via INTRAVENOUS
  Filled 2023-05-08 (×5): qty 2

## 2023-05-08 MED ORDER — DOXYCYCLINE HYCLATE 100 MG PO TABS
100.0000 mg | ORAL_TABLET | Freq: Two times a day (BID) | ORAL | Status: DC
  Administered 2023-05-08 – 2023-05-10 (×5): 100 mg via ORAL
  Filled 2023-05-08 (×5): qty 1

## 2023-05-08 MED ORDER — FUROSEMIDE 10 MG/ML IJ SOLN
40.0000 mg | Freq: Every day | INTRAMUSCULAR | Status: DC
  Administered 2023-05-08: 40 mg via INTRAVENOUS
  Filled 2023-05-08: qty 4

## 2023-05-08 NOTE — Plan of Care (Signed)
  Problem: Education: Goal: Understanding of CV disease, CV risk reduction, and recovery process will improve Outcome: Progressing Goal: Individualized Educational Video(s) Outcome: Progressing   Problem: Activity: Goal: Ability to return to baseline activity level will improve Outcome: Progressing   Problem: Cardiovascular: Goal: Ability to achieve and maintain adequate cardiovascular perfusion will improve Outcome: Progressing Goal: Vascular access site(s) Level 0-1 will be maintained Outcome: Progressing   Problem: Health Behavior/Discharge Planning: Goal: Ability to safely manage health-related needs after discharge will improve Outcome: Progressing   Problem: Education: Goal: Ability to demonstrate management of disease process will improve Outcome: Progressing Goal: Ability to verbalize understanding of medication therapies will improve Outcome: Progressing Goal: Individualized Educational Video(s) Outcome: Progressing   Problem: Activity: Goal: Capacity to carry out activities will improve Outcome: Progressing   Problem: Cardiac: Goal: Ability to achieve and maintain adequate cardiopulmonary perfusion will improve Outcome: Progressing   Problem: Education: Goal: Knowledge of General Education information will improve Description: Including pain rating scale, medication(s)/side effects and non-pharmacologic comfort measures Outcome: Progressing   Problem: Health Behavior/Discharge Planning: Goal: Ability to manage health-related needs will improve Outcome: Progressing   Problem: Clinical Measurements: Goal: Ability to maintain clinical measurements within normal limits will improve Outcome: Progressing Goal: Will remain free from infection Outcome: Progressing Goal: Diagnostic test results will improve Outcome: Progressing Goal: Respiratory complications will improve Outcome: Progressing Goal: Cardiovascular complication will be avoided Outcome:  Progressing   Problem: Activity: Goal: Risk for activity intolerance will decrease Outcome: Progressing   Problem: Nutrition: Goal: Adequate nutrition will be maintained Outcome: Progressing   Problem: Coping: Goal: Level of anxiety will decrease Outcome: Progressing   Problem: Elimination: Goal: Will not experience complications related to bowel motility Outcome: Progressing Goal: Will not experience complications related to urinary retention Outcome: Progressing   Problem: Pain Managment: Goal: General experience of comfort will improve Outcome: Progressing   Problem: Safety: Goal: Ability to remain free from injury will improve Outcome: Progressing   Problem: Skin Integrity: Goal: Risk for impaired skin integrity will decrease Outcome: Progressing   

## 2023-05-08 NOTE — Progress Notes (Signed)
[facility]  PROGRESS NOTE  [name] [id number] DOB: August 16, 1951 DOA: 05/06/2023 PCP: [name], PA   Brief HPI:   72 y.o. male with medical history significant of hypertension, hyperlipidemia, GERD, hypothyroidism, COPD, diastolic CHF, chronic respiratory failure intermittently on 2 L, obesity, coronary calcium, gout, lung cancer in remission, non-Hodgkin's lymphoma in remission, BPH, A-fib, depression, polycythemia presented  with shortness of breath.  He did have a heart cath 3 weeks ago to workup dyspnea on exertion in the setting of coronary calcification on imaging. He had a clean cath and was started on Lasix given diastolic CHF and atrial fibrillation.   Chest x-ray showed new small bilateral effusions and patchy basilar airspace opacities consistent with edema versus pneumonia.  Patient received 20 milligrams IV Lasix in the ED     Assessment/Plan:   Acute on chronic diastolic CHF -Diuresed well with IV Lasix -Continue Lasix 40 mg IV daily -Net -3.9 L -Antibiotics, ceftriaxone and Zithromax.,  Procalcitonin less than 0.10  Acute on chronic anemia -Hemoglobin was 9.6, improved to 10.4 after diuresis -Monitor hemoglobin intermittently    Hypertension - Continue home amlodipine, lisinopril, metoprolol - On Lasix as above  Hypomagnesemia -Replete   Hyperlipidemia - Continue home Zetia and rosuvastatin   GERD - Continue PPI   Hypothyroidism - Continue home Synthroid   COPD exacerbation -Started on Solu-Medrol 40 mg IV every 12 hours, DuoNeb nebulizers every 6 hours -Mucinex 1200 mg p.o. twice daily -Will start doxycycline 100 mg p.o. twice daily   Atrial fibrillation - Continue home metoprolol and Eliquis   Gout - Continue home allopurinol   BPH - Continue home finasteride   History of lung cancer status post radiation in remission History of non-Hodgkin lymphoma status post R-CHOP in remission - Follows with  oncology   Medications     allopurinol  300 mg Oral Daily   amLODipine  5 mg Oral Daily   apixaban  5 mg Oral BID   ezetimibe  10 mg Oral Daily   finasteride  5 mg Oral Daily   fluticasone furoate-vilanterol  1 puff Inhalation Daily   furosemide  40 mg Oral Daily   guaiFENesin  1,200 mg Oral BID   ipratropium-albuterol  3 mL Nebulization Q6H   levothyroxine  175 mcg Oral Daily   lisinopril  10 mg Oral Daily   methylPREDNISolone (SOLU-MEDROL) injection  40 mg Intravenous Q12H   metoprolol succinate  25 mg Oral Daily   pantoprazole  40 mg Oral Daily   rosuvastatin  10 mg Oral Daily   sodium chloride flush  3 mL Intravenous Q12H     Data Reviewed:   CBG:  No results for input(s): "GLUCAP" in the last 168 hours.  SpO2: 96 % O2 Flow Rate (L/min): 3 L/min    Vitals:   05/07/23 2210 05/08/23 0703 05/08/23 0714 05/08/23 0729  BP: 112/60   123/69  Pulse: 99  85   Resp: 18  18 17   Temp: 98.5 F (36.9 C)   98.4 F (36.9 C)  TempSrc: Oral   Oral  SpO2: 96%  94% 96%  Weight:  89.9 kg    Height:          Data Reviewed:  Basic Metabolic Panel: Recent Labs  Lab 05/06/23 1603 05/06/23 1814 05/07/23 0143 05/07/23 1102 05/08/23 0027  NA 139  --  138  --  136  K 4.1  --  4.8  --  4.1  CL 99  --  99  --  95*  CO2 29  --  33*  --  32  GLUCOSE 112*  --  127*  --  179*  BUN 19  --  17  --  23  CREATININE 1.23  --  1.01  --  1.06  CALCIUM 8.5*  --  8.8*  --  8.8*  MG  --  0.9*  --  1.6* 1.8    CBC: Recent Labs  Lab 05/06/23 1603 05/07/23 0143  WBC 8.9 7.0  NEUTROABS 7.2  --   HGB 9.6* 10.4*  HCT 31.0* 34.2*  MCV 90.6 90.2  PLT 218 230    LFT Recent Labs  Lab 05/06/23 1603 05/07/23 0143  AST 14* 12*  ALT 15 13  ALKPHOS 69 77  BILITOT 0.9 0.8  PROT 5.4* 5.5*  ALBUMIN 2.8* 2.8*     Antibiotics: Anti-infectives (From admission, onward)    Start     Dose/Rate Route Frequency Ordered Stop   05/06/23 1745  cefTRIAXone (ROCEPHIN) 1 g in sodium  chloride 0.9 % 100 mL IVPB  Status:  Discontinued        1 g 200 mL/hr over 30 Minutes Intravenous  Once 05/06/23 1737 05/06/23 1811   05/06/23 1745  azithromycin (ZITHROMAX) 500 mg in sodium chloride 0.9 % 250 mL IVPB  Status:  Discontinued        500 mg 250 mL/hr over 60 Minutes Intravenous  Once 05/06/23 1737 05/06/23 1811        DVT prophylaxis: Apixaban  Code Status: Full code  Family Communication:    CONSULTS    Subjective    Still complains of shortness of breath.  Coughing up phlegm.  Objective    Physical Examination:   General-appears in no acute distress Heart-S1-S2, regular, no murmur auscultated Lungs-bilateral rhonchi auscultated Abdomen-soft, nontender, no organomegaly Extremities-no edema in the lower extremities Neuro-alert, oriented x3, no focal deficit noted  Status is: Inpatient:             Ashiya Kinkead S Tukker Byrns   [facility] If 7PM-7AM, please contact night-coverage at [email or web address], Office  [phone]   05/08/2023, 8:13 AM  LOS: 1 day

## 2023-05-09 DIAGNOSIS — E039 Hypothyroidism, unspecified: Secondary | ICD-10-CM | POA: Diagnosis not present

## 2023-05-09 DIAGNOSIS — J9621 Acute and chronic respiratory failure with hypoxia: Secondary | ICD-10-CM | POA: Diagnosis not present

## 2023-05-09 DIAGNOSIS — I5031 Acute diastolic (congestive) heart failure: Secondary | ICD-10-CM | POA: Diagnosis not present

## 2023-05-09 DIAGNOSIS — I5033 Acute on chronic diastolic (congestive) heart failure: Secondary | ICD-10-CM | POA: Diagnosis not present

## 2023-05-09 LAB — BASIC METABOLIC PANEL
Anion gap: 11 (ref 5–15)
BUN: 34 mg/dL — ABNORMAL HIGH (ref 8–23)
CO2: 31 mmol/L (ref 22–32)
Calcium: 8.7 mg/dL — ABNORMAL LOW (ref 8.9–10.3)
Chloride: 94 mmol/L — ABNORMAL LOW (ref 98–111)
Creatinine, Ser: 1.59 mg/dL — ABNORMAL HIGH (ref 0.61–1.24)
GFR, Estimated: 46 mL/min — ABNORMAL LOW (ref >60)
Glucose, Bld: 150 mg/dL — ABNORMAL HIGH (ref 70–99)
Potassium: 4.4 mmol/L (ref 3.5–5.1)
Sodium: 136 mmol/L (ref 135–145)

## 2023-05-09 NOTE — Progress Notes (Signed)
[facility]  PROGRESS NOTE  KAHNER HOLAHAN [id number]:[id number] DOB: 05-Nov-1950 DOA: 05/06/2023 PCP: [name], PA   Brief HPI:   72 y.o. male with medical history significant of hypertension, hyperlipidemia, GERD, hypothyroidism, COPD, diastolic CHF, chronic respiratory failure intermittently on 2 L, obesity, coronary calcium, gout, lung cancer in remission, non-Hodgkin's lymphoma in remission, BPH, A-fib, depression, polycythemia presented  with shortness of breath.  He did have a heart cath 3 weeks ago to workup dyspnea on exertion in the setting of coronary calcification on imaging. He had a clean cath and was started on Lasix given diastolic CHF and atrial fibrillation.   Chest x-ray showed new small bilateral effusions and patchy basilar airspace opacities consistent with edema versus pneumonia.  Patient received 20 milligrams IV Lasix in the ED     Assessment/Plan:   Acute on chronic diastolic CHF -Diuresed well with IV Lasix -BUN/creatinine is getting worse with diuresis -Creatinine 1.59, will hold further doses of IV Lasix -Net -4.9 L -Antibiotics, ceftriaxone and Zithromax were discontinued. Procalcitonin less than 0.10  Acute on chronic anemia -Hemoglobin was 9.6, improved to 10.4 after diuresis -Monitor hemoglobin intermittently    Hypertension - Continue home amlodipine, lisinopril, metoprolol -Lasix on hold as above   Hypomagnesemia -Replete   Hyperlipidemia - Continue home Zetia and rosuvastatin   GERD - Continue PPI   Hypothyroidism - Continue home Synthroid   COPD exacerbation -Started on Solu-Medrol 40 mg IV every 12 hours, DuoNeb nebulizers every 6 hours -Mucinex 1200 mg p.o. twice daily -Continue doxycycline 100 mg p.o. twice daily   Atrial fibrillation - Continue home metoprolol and Eliquis   Gout - Continue home allopurinol   BPH - Continue home finasteride   History of lung cancer status post radiation in remission History of  non-Hodgkin lymphoma status post R-CHOP in remission - Follows with oncology   Medications     allopurinol  300 mg Oral Daily   amLODipine  5 mg Oral Daily   apixaban  5 mg Oral BID   doxycycline  100 mg Oral Q12H   ezetimibe  10 mg Oral Daily   finasteride  5 mg Oral Daily   fluticasone furoate-vilanterol  1 puff Inhalation Daily   guaiFENesin  1,200 mg Oral BID   ipratropium-albuterol  3 mL Nebulization Q6H   levothyroxine  175 mcg Oral Daily   lisinopril  10 mg Oral Daily   methylPREDNISolone (SOLU-MEDROL) injection  40 mg Intravenous Q12H   metoprolol succinate  25 mg Oral Daily   pantoprazole  40 mg Oral Daily   rosuvastatin  10 mg Oral Daily   sodium chloride flush  3 mL Intravenous Q12H     Data Reviewed:   CBG:  No results for input(s): "GLUCAP" in the last 168 hours.  SpO2: 98 % O2 Flow Rate (L/min): 2 L/min    Vitals:   05/09/23 0107 05/09/23 0557 05/09/23 0734 05/09/23 0830  BP: 112/61 114/73    Pulse: 92 96 89   Resp: 20 20 18    Temp: 98.1 F (36.7 C) 97.7 F (36.5 C) 98.2 F (36.8 C)   TempSrc: Oral Oral Oral   SpO2: 97% 97% 99% 98%  Weight:  88.9 kg    Height:          Data Reviewed:  Basic Metabolic Panel: Recent Labs  Lab 05/06/23 1603 05/06/23 1814 05/07/23 0143 05/07/23 1102 05/08/23 0027 05/09/23 0048  NA 139  --  138  --  136 136  K 4.1  --  4.8  --  4.1 4.4  CL 99  --  99  --  95* 94*  CO2 29  --  33*  --  32 31  GLUCOSE 112*  --  127*  --  179* 150*  BUN 19  --  17  --  23 34*  CREATININE 1.23  --  1.01  --  1.06 1.59*  CALCIUM 8.5*  --  8.8*  --  8.8* 8.7*  MG  --  0.9*  --  1.6* 1.8  --     CBC: Recent Labs  Lab 05/06/23 1603 05/07/23 0143  WBC 8.9 7.0  NEUTROABS 7.2  --   HGB 9.6* 10.4*  HCT 31.0* 34.2*  MCV 90.6 90.2  PLT 218 230    LFT Recent Labs  Lab 05/06/23 1603 05/07/23 0143  AST 14* 12*  ALT 15 13  ALKPHOS 69 77  BILITOT 0.9 0.8  PROT 5.4* 5.5*  ALBUMIN 2.8* 2.8*      Antibiotics: Anti-infectives (From admission, onward)    Start     Dose/Rate Route Frequency Ordered Stop   05/08/23 1000  doxycycline (VIBRA-TABS) tablet 100 mg        100 mg Oral Every 12 hours 05/08/23 0817     05/06/23 1745  cefTRIAXone (ROCEPHIN) 1 g in sodium chloride 0.9 % 100 mL IVPB  Status:  Discontinued        1 g 200 mL/hr over 30 Minutes Intravenous  Once 05/06/23 1737 05/06/23 1811   05/06/23 1745  azithromycin (ZITHROMAX) 500 mg in sodium chloride 0.9 % 250 mL IVPB  Status:  Discontinued        500 mg 250 mL/hr over 60 Minutes Intravenous  Once 05/06/23 1737 05/06/23 1811        DVT prophylaxis: Apixaban  Code Status: Full code  Family Communication:    CONSULTS    Subjective   Breathing has improved.  Renal function worse after starting IV Lasix.   Objective    Physical Examination:   Appears in no acute distress S1-S2, regular Lungs clear to auscultation bilaterally Abdomen is soft, nontender, no organomegaly Extremities no edema in the lower extremities  Status is: Inpatient:             Meredeth Ide   [facility] If 7PM-7AM, please contact night-coverage at [email or web address], Office  [phone]   05/09/2023, 8:33 AM  LOS: 2 days

## 2023-05-09 NOTE — Plan of Care (Signed)
  Problem: Education: Goal: Understanding of CV disease, CV risk reduction, and recovery process will improve Outcome: Progressing Goal: Individualized Educational Video(s) Outcome: Progressing   Problem: Activity: Goal: Ability to return to baseline activity level will improve Outcome: Progressing   Problem: Cardiovascular: Goal: Ability to achieve and maintain adequate cardiovascular perfusion will improve Outcome: Progressing Goal: Vascular access site(s) Level 0-1 will be maintained Outcome: Progressing   Problem: Health Behavior/Discharge Planning: Goal: Ability to safely manage health-related needs after discharge will improve Outcome: Progressing   Problem: Education: Goal: Ability to demonstrate management of disease process will improve Outcome: Progressing Goal: Ability to verbalize understanding of medication therapies will improve Outcome: Progressing Goal: Individualized Educational Video(s) Outcome: Progressing   Problem: Activity: Goal: Capacity to carry out activities will improve Outcome: Progressing   Problem: Cardiac: Goal: Ability to achieve and maintain adequate cardiopulmonary perfusion will improve Outcome: Progressing   Problem: Education: Goal: Knowledge of General Education information will improve Description: Including pain rating scale, medication(s)/side effects and non-pharmacologic comfort measures Outcome: Progressing   Problem: Health Behavior/Discharge Planning: Goal: Ability to manage health-related needs will improve Outcome: Progressing   Problem: Clinical Measurements: Goal: Ability to maintain clinical measurements within normal limits will improve Outcome: Progressing Goal: Will remain free from infection Outcome: Progressing Goal: Diagnostic test results will improve Outcome: Progressing Goal: Respiratory complications will improve Outcome: Progressing Goal: Cardiovascular complication will be avoided Outcome:  Progressing   Problem: Activity: Goal: Risk for activity intolerance will decrease Outcome: Progressing   Problem: Nutrition: Goal: Adequate nutrition will be maintained Outcome: Progressing   Problem: Coping: Goal: Level of anxiety will decrease Outcome: Progressing   Problem: Elimination: Goal: Will not experience complications related to bowel motility Outcome: Progressing Goal: Will not experience complications related to urinary retention Outcome: Progressing   Problem: Pain Managment: Goal: General experience of comfort will improve Outcome: Progressing   Problem: Safety: Goal: Ability to remain free from injury will improve Outcome: Progressing   Problem: Skin Integrity: Goal: Risk for impaired skin integrity will decrease Outcome: Progressing   

## 2023-05-10 DIAGNOSIS — I5033 Acute on chronic diastolic (congestive) heart failure: Secondary | ICD-10-CM | POA: Diagnosis not present

## 2023-05-10 DIAGNOSIS — I5031 Acute diastolic (congestive) heart failure: Secondary | ICD-10-CM | POA: Diagnosis not present

## 2023-05-10 DIAGNOSIS — J9611 Chronic respiratory failure with hypoxia: Secondary | ICD-10-CM | POA: Diagnosis not present

## 2023-05-10 DIAGNOSIS — J9621 Acute and chronic respiratory failure with hypoxia: Secondary | ICD-10-CM | POA: Diagnosis not present

## 2023-05-10 LAB — BASIC METABOLIC PANEL
Anion gap: 14 (ref 5–15)
BUN: 35 mg/dL — ABNORMAL HIGH (ref 8–23)
CO2: 32 mmol/L (ref 22–32)
Calcium: 9.2 mg/dL (ref 8.9–10.3)
Chloride: 94 mmol/L — ABNORMAL LOW (ref 98–111)
Creatinine, Ser: 1.39 mg/dL — ABNORMAL HIGH (ref 0.61–1.24)
GFR, Estimated: 54 mL/min — ABNORMAL LOW (ref >60)
Glucose, Bld: 196 mg/dL — ABNORMAL HIGH (ref 70–99)
Potassium: 4.4 mmol/L (ref 3.5–5.1)
Sodium: 140 mmol/L (ref 135–145)

## 2023-05-10 MED ORDER — GUAIFENESIN ER 600 MG PO TB12: 600.0000 mg | ORAL_TABLET | Freq: Two times a day (BID) | ORAL | 0 refills | Status: AC

## 2023-05-10 MED ORDER — PREDNISONE 10 MG PO TABS: ORAL_TABLET | ORAL | 0 refills | Status: DC

## 2023-05-10 MED ORDER — IPRATROPIUM-ALBUTEROL 0.5-2.5 (3) MG/3ML IN SOLN: 3.0000 mL | Freq: Four times a day (QID) | RESPIRATORY_TRACT | 1 refills | Status: DC | PRN

## 2023-05-10 MED ORDER — DOXYCYCLINE HYCLATE 100 MG PO TABS: 100.0000 mg | ORAL_TABLET | Freq: Two times a day (BID) | ORAL | 0 refills | Status: AC

## 2023-05-10 MED ORDER — IPRATROPIUM-ALBUTEROL 0.5-2.5 (3) MG/3ML IN SOLN: 3.0000 mL | Freq: Two times a day (BID) | RESPIRATORY_TRACT | Status: DC

## 2023-05-10 NOTE — Progress Notes (Signed)
Heart Failure Navigator Progress Note  Assessed for Heart & Vascular TOC clinic readiness.  Patient does not meet criteria due to EF 60-65%, Has [facility] appointment 05/18/2023. .   Navigator will sign off at this time.    Rhae Hammock, BSN, Scientist, clinical (histocompatibility and immunogenetics) Only

## 2023-05-10 NOTE — Discharge Summary (Addendum)
Physician Discharge Summary   Patient: [name] MRN: [id number] DOB: 09-25-1950  Admit date:     05/06/2023  Discharge date: 05/10/23  Discharge Physician: Meredeth Ide   PCP: [name], PA   Recommendations at discharge:   Follow-up PCP in 1 weeks Check BMP in 1 week Follow-up cardiology on 05/18/2023  Discharge Diagnoses: Principal Problem:   Acute diastolic CHF (congestive heart failure) (HCC) Active Problems:   Essential hypertension   COPD (chronic obstructive pulmonary disease) (HCC)   Squamous cell carcinoma lung (HCC)   Atrial fibrillation (HCC)   Pure hypercholesterolemia   Hypothyroidism   Chronic respiratory failure with hypoxia (HCC)   NHL (non-Hodgkin's lymphoma) (HCC)   Congestive heart failure (HCC)   GERD (gastroesophageal reflux disease)   Obesity   Polycythemia   Acute on chronic diastolic CHF (congestive heart failure) (HCC)   Acute on chronic respiratory failure with hypoxia (HCC)  Resolved Problems:   * No resolved hospital problems. *  Hospital Course:  72 y.o. male with medical history significant of hypertension, hyperlipidemia, GERD, hypothyroidism, COPD, diastolic CHF, chronic respiratory failure intermittently on 2 L, obesity, coronary calcium, gout, lung cancer in remission, non-Hodgkin's lymphoma in remission, BPH, A-fib, depression, polycythemia presented  with shortness of breath.  He did have a heart cath 3 weeks ago to workup dyspnea on exertion in the setting of coronary calcification on imaging. He had a clean cath and was started on Lasix given diastolic CHF and atrial fibrillation.   Chest x-ray showed new small bilateral effusions and patchy basilar airspace opacities consistent with edema versus pneumonia.  Patient received 20 milligrams IV Lasix in the ED     Assessment and Plan:  Acute on chronic diastolic CHF -Diuresed well with IV Lasix -BUN/creatinine is getting worse with diuresis -Creatinine 1.59, will hold  further doses of IV Lasix -Net -5.9 L -Antibiotics, ceftriaxone and Zithromax were discontinued. Procalcitonin less than 0.10 -Continue taking Lasix 20 mg daily, hold Lasix for 3 days due to renal insufficiency -Start taking from 05/13/2023 -Patient already has appointment to see cardiology on 05/18/2023   Acute on chronic anemia -Hemoglobin was 9.6, improved to 10.4 after diuresis -Monitor hemoglobin intermittently     Hypertension - Continue home amlodipine, lisinopril, metoprolol -Lasix on hold as above  -Start taking Lasix on 8/22  Acute kidney injury -Creatinine went up to 1.59 after diuresis, diuresis was held -Creatinine improved to 1.39 today -Discharge home, hold Lasix for 3 more days and then start taking from 8/22 as above   Hypomagnesemia -Replete   Hyperlipidemia - Continue home Zetia and rosuvastatin   GERD - Continue PPI   Hypothyroidism - Continue home Synthroid   COPD exacerbation -Started on Solu-Medrol 40 mg IV every 12 hours, DuoNeb nebulizers every 6 hours -Mucinex 600 mg  mg p.o. twice daily for 5 days -Continue doxycycline 100 mg p.o. twice daily for 4 more days -Will discharge on prednisone taper for 5 days -Wants to take DuoNeb nebulizers instead of albuterol nebulizer.  Will discharge on DuoNeb nebulizers every 6 hours as needed   Atrial fibrillation - Continue home metoprolol and Eliquis   Gout - Continue home allopurinol   BPH - Continue home finasteride   History of lung cancer status post radiation in remission History of non-Hodgkin lymphoma status post R-CHOP in remission - Follows with oncology         Consultants:  Procedures performed:  Disposition: Home Diet recommendation:  Discharge Diet Orders (From admission,  onward)     Start     Ordered   05/10/23 0000  Diet - low sodium heart healthy        05/10/23 1058           Regular diet DISCHARGE MEDICATION: Allergies as of 05/10/2023       Reactions    Lipitor [atorvastatin] Other (See Comments)   Myalgias  Weakness  Fatigue    Omnipaque [iohexol] Itching, Other (See Comments)   When asked about contrast media today, pt stated that last time he had the contrast that he was red all over and itchy from head to toe.         Medication List     STOP taking these medications    Lokelma 10 g Pack packet Generic drug: sodium zirconium cyclosilicate       TAKE these medications    allopurinol 300 MG tablet Commonly known as: ZYLOPRIM Take 300 mg by mouth daily.   amLODipine 5 MG tablet Commonly known as: NORVASC Take 5 mg by mouth daily.   apixaban 5 MG Tabs tablet Commonly known as: ELIQUIS Take 1 tablet (5 mg total) by mouth 2 (two) times daily.   betamethasone dipropionate 0.05 % lotion Apply 1 application  topically 2 (two) times daily. To legs for psoriasis   Buprenorphine HCl-Naloxone HCl 8-2 MG Film Place 0.5-1 Film under the tongue 2 (two) times daily as needed (back pain).   doxycycline 100 MG tablet Commonly known as: VIBRA-TABS Take 1 tablet (100 mg total) by mouth every 12 (twelve) hours for 4 days.   ezetimibe 10 MG tablet Commonly known as: ZETIA Take 10 mg by mouth daily.   finasteride 5 MG tablet Commonly known as: PROSCAR Take 5 mg by mouth daily.   furosemide 20 MG tablet Commonly known as: Lasix Take 1 tablet (20 mg total) by mouth daily.   guaiFENesin 600 MG 12 hr tablet Commonly known as: MUCINEX Take 1 tablet (600 mg total) by mouth 2 (two) times daily for 5 days.   ipratropium-albuterol 0.5-2.5 (3) MG/3ML Soln Commonly known as: DUONEB Take 3 mLs by nebulization every 6 (six) hours as needed.   levothyroxine 175 MCG tablet Commonly known as: SYNTHROID Take 175 mcg by mouth daily.   Linzess 290 MCG Caps capsule Generic drug: linaclotide Take 290 mcg by mouth daily.   lisinopril 10 MG tablet Commonly known as: ZESTRIL Take 1 tablet (10 mg total) by mouth daily.   metoprolol  succinate 25 MG 24 hr tablet Commonly known as: Toprol XL Take 1 tablet (25 mg total) by mouth daily.   omeprazole 20 MG capsule Commonly known as: PRILOSEC Take 1 capsule (20 mg total) by mouth 2 (two) times daily. What changed: when to take this   predniSONE 10 MG tablet Commonly known as: DELTASONE Prednisone 40 mg po daily x 1 day then Prednisone 30 mg po daily x 1 day then Prednisone 20 mg po daily x 1 day then Prednisone 10 mg daily x 1 day then stop... What changed:  medication strength additional instructions   ProAir HFA 108 (90 Base) MCG/ACT inhaler Generic drug: albuterol Inhale 2 puffs into the lungs every 6 (six) hours as needed for wheezing or shortness of breath. What changed: Another medication with the same name was removed. Continue taking this medication, and follow the directions you see here.   rosuvastatin 10 MG tablet Commonly known as: CRESTOR Take 1 tablet (10 mg total) by mouth daily.  Trelegy Ellipta 100-62.5-25 MCG/INH Aepb Generic drug: Fluticasone-Umeclidin-Vilant Inhale 1 puff into the lungs daily.   zolpidem 10 MG tablet Commonly known as: AMBIEN Take 10 mg by mouth at bedtime as needed for sleep.        Follow-up Information     [name], PA Follow up in 2 week(s).   Specialty: Physician Assistant Contact information: [phone] CT [place] [address] [phone]                Discharge Exam: Ceasar Mons Weights   05/08/23 0703 05/09/23 0557 05/10/23 0432  Weight: 89.9 kg 88.9 kg 89.3 kg   General-appears in no acute distress Heart-S1-S2, regular, no murmur auscultated Lungs-clear to auscultation bilaterally, no wheezing or crackles auscultated Abdomen-soft, nontender, no organomegaly Extremities-no edema in the lower extremities Neuro-alert, oriented x3, no focal deficit noted  Condition at discharge: good  The results of significant diagnostics from this hospitalization (including imaging, microbiology,  ancillary and laboratory) are listed below for reference.   Imaging Studies: DG Chest 2 View  Result Date: 05/06/2023 CLINICAL DATA:  Shortness of breath. EXAM: CHEST - 2 VIEW COMPARISON:  Radiographs 02/12/2023 and 01/23/2023.  CT 01/23/2023. FINDINGS: The frontal examination was repeated. Right IJ Port-A-Cath tip is unchanged, extending to the level of the superior cavoatrial junction. The heart size and mediastinal contours are stable. There is stable chronic right upper lobe consolidation with volume loss attributed to prior radiation therapy for lung cancer. There are new small bilateral pleural effusions with extension of fluid into the fissures, best seen on the lateral view. There are new patchy airspace opacities at both lung bases which could be secondary to bronchopneumonia or asymmetric edema. No evidence of pneumothorax or acute osseous abnormality. IMPRESSION: New small bilateral pleural effusions and patchy bibasilar airspace opacities which could be secondary to bronchopneumonia or asymmetric edema. Electronically Signed   By: Carey Bullocks M.D.   On: 05/06/2023 17:06   CARDIAC CATHETERIZATION  Result Date: 04/12/2023   Angiographically normal coronary arteries; codominant system   The left ventricular systolic function is normal. The left ventricular ejection fraction is greater than 65% by visual estimate.   LV end diastolic pressure is severely elevated.   There is no aortic valve stenosis.   Will start low-dose Lasix on discharge (40 mg IV given in the Cath Lab)   Recommend to resume Apixaban, at currently prescribed dose and frequency on 04/12/2023.   Okay to restart Eliquis p.m. 04/12/2023. POST-CATH DIAGNOSES Angiographically Normal Coronaries and a Codominant System. Hyperdynamic LV in setting of A-fib RVR with severely elevated LVEDP of 27-29 mmHg suggestive of A-fib related Diastolic Heart Failure RECOMMENDATIONS Will start low-dose Lasix on discharge (40 mg IV given in the Cath  Lab) Recommend to resume Apixaban, at currently prescribed dose and frequency on 04/12/2023. Okay to restart Eliquis p.m. 04/12/2023. Follow-up with Dr. Tomie China. Bryan Lemma, MD   Microbiology: Results for orders placed or performed during the hospital encounter of 05/06/23  SARS Coronavirus 2 by RT PCR (hospital order, performed in [facility] hospital lab) *cepheid single result test* Anterior Nasal Swab     Status: None   Collection Time: 05/06/23  4:03 PM   Specimen: Anterior Nasal Swab  Result Value Ref Range Status   SARS Coronavirus 2 by RT PCR NEGATIVE NEGATIVE Final    Comment: Performed at [facility], [address] [place], [place] [address]  Culture, blood (routine x 2)     Status: None (Preliminary  result)   Collection Time: 05/06/23  6:00 PM   Specimen: BLOOD  Result Value Ref Range Status   Specimen Description BLOOD SITE NOT SPECIFIED  Final   Special Requests   Final    BOTTLES DRAWN AEROBIC AND ANAEROBIC Blood Culture results may not be optimal due to an excessive volume of blood received in culture bottles   Culture   Final    NO GROWTH 4 DAYS Performed at [facility], [address] [place], [place] [address]    Report Status PENDING  Incomplete  Culture, blood (routine x 2)     Status: None (Preliminary result)   Collection Time: 05/06/23  6:00 PM   Specimen: BLOOD  Result Value Ref Range Status   Specimen Description BLOOD SITE NOT SPECIFIED  Final   Special Requests   Final    BOTTLES DRAWN AEROBIC AND ANAEROBIC Blood Culture adequate volume   Culture   Final    NO GROWTH 4 DAYS Performed at [facility], [address], [place], [place] [address]    Report Status PENDING  Incomplete    Labs: CBC: Recent Labs  Lab 05/06/23 1603 05/07/23 0143  WBC 8.9 7.0  NEUTROABS 7.2  --   HGB 9.6* 10.4*  HCT 31.0* 34.2*  MCV 90.6 90.2  PLT 218 230   Basic Metabolic Panel: Recent Labs  Lab 05/06/23 1603 05/06/23 1814 05/07/23 0143  05/07/23 1102 05/08/23 0027 05/09/23 0048 05/10/23 0748  NA 139  --  138  --  136 136 140  K 4.1  --  4.8  --  4.1 4.4 4.4  CL 99  --  99  --  95* 94* 94*  CO2 29  --  33*  --  32 31 32  GLUCOSE 112*  --  127*  --  179* 150* 196*  BUN 19  --  17  --  23 34* 35*  CREATININE 1.23  --  1.01  --  1.06 1.59* 1.39*  CALCIUM 8.5*  --  8.8*  --  8.8* 8.7* 9.2  MG  --  0.9*  --  1.6* 1.8  --   --    Liver Function Tests: Recent Labs  Lab 05/06/23 1603 05/07/23 0143  AST 14* 12*  ALT 15 13  ALKPHOS 69 77  BILITOT 0.9 0.8  PROT 5.4* 5.5*  ALBUMIN 2.8* 2.8*   CBG: No results for input(s): "GLUCAP" in the last 168 hours.  Discharge time spent: greater than 30 minutes.  Signed: Meredeth Ide, MD [facility] 05/10/2023

## 2023-05-10 NOTE — TOC Transition Note (Signed)
Transition of Care [facility]) - CM/SW Discharge Note   Patient Details  Name: [name] MRN: [id number] Date of Birth: November 23, 1950  Transition [facility]) CM/SW Contact:  Leone Haven, RN Phone Number: 05/10/2023, 11:45 AM   Clinical Narrative:    Patient is for dc today, he will need a neb machine, he states he has no preference of the agency.  NCM made referral to [facility] with [employer], he will deliver the neb machine to patient's home address.  NCM informed patient of this information,  he states his wife will transport him home today she is at the bedside.       Barriers to Discharge: Continued Medical Work up   Patient Goals and CMS Choice   Choice offered to / list presented to : NA  Discharge Placement                         Discharge Plan and Services Additional resources added to the After Visit Summary for   In-house Referral: NA Discharge Planning Services: CM Consult Post Acute Care Choice: NA          DME Arranged: N/A DME Agency: NA       HH Arranged: NA          Social Determinants of Health (SDOH) Interventions SDOH Screenings   Food Insecurity: No Food Insecurity (05/06/2023)  Housing: Patient Declined (05/06/2023)  Transportation Needs: No Transportation Needs (05/06/2023)  Utilities: Not At Risk (05/06/2023)  Tobacco Use: Medium Risk (05/06/2023)     Readmission Risk Interventions     No data to display

## 2023-05-10 NOTE — Progress Notes (Incomplete)
[facility]  PROGRESS NOTE  THAN FORSEY ZOX:[id number] DOB: 07/07/1951 DOA: 05/06/2023 PCP: [name], PA   Brief HPI:   72 y.o. male with medical history significant of hypertension, hyperlipidemia, GERD, hypothyroidism, COPD, diastolic CHF, chronic respiratory failure intermittently on 2 L, obesity, coronary calcium, gout, lung cancer in remission, non-Hodgkin's lymphoma in remission, BPH, A-fib, depression, polycythemia presented  with shortness of breath.  He did have a heart cath 3 weeks ago to workup dyspnea on exertion in the setting of coronary calcification on imaging. He had a clean cath and was started on Lasix given diastolic CHF and atrial fibrillation.   Chest x-ray showed new small bilateral effusions and patchy basilar airspace opacities consistent with edema versus pneumonia.  Patient received 20 milligrams IV Lasix in the ED     Assessment/Plan:   Acute on chronic diastolic CHF -Diuresed well with IV Lasix -BUN/creatinine is getting worse with diuresis -Creatinine 1.59, will hold further doses of IV Lasix -Net -4.9 L -Antibiotics, ceftriaxone and Zithromax were discontinued. Procalcitonin less than 0.10  Acute on chronic anemia -Hemoglobin was 9.6, improved to 10.4 after diuresis -Monitor hemoglobin intermittently    Hypertension - Continue home amlodipine, lisinopril, metoprolol -Lasix on hold as above   Hypomagnesemia -Replete   Hyperlipidemia - Continue home Zetia and rosuvastatin   GERD - Continue PPI   Hypothyroidism - Continue home Synthroid   COPD exacerbation -Started on Solu-Medrol 40 mg IV every 12 hours, DuoNeb nebulizers every 6 hours -Mucinex 1200 mg p.o. twice daily -Continue doxycycline 100 mg p.o. twice daily   Atrial fibrillation - Continue home metoprolol and Eliquis   Gout - Continue home allopurinol   BPH - Continue home finasteride   History of lung cancer status post radiation in remission History of  non-Hodgkin lymphoma status post R-CHOP in remission - Follows with oncology   Medications     allopurinol  300 mg Oral Daily   amLODipine  5 mg Oral Daily   apixaban  5 mg Oral BID   doxycycline  100 mg Oral Q12H   ezetimibe  10 mg Oral Daily   finasteride  5 mg Oral Daily   fluticasone furoate-vilanterol  1 puff Inhalation Daily   guaiFENesin  1,200 mg Oral BID   ipratropium-albuterol  3 mL Nebulization Q6H   levothyroxine  175 mcg Oral Daily   lisinopril  10 mg Oral Daily   methylPREDNISolone (SOLU-MEDROL) injection  40 mg Intravenous Q12H   metoprolol succinate  25 mg Oral Daily   pantoprazole  40 mg Oral Daily   rosuvastatin  10 mg Oral Daily   sodium chloride flush  3 mL Intravenous Q12H     Data Reviewed:   CBG:  No results for input(s): "GLUCAP" in the last 168 hours.  SpO2: 93 % O2 Flow Rate (L/min): 2 L/min    Vitals:   05/09/23 1543 05/09/23 2023 05/10/23 0039 05/10/23 0432  BP: (!) 101/55 109/67 118/73 128/71  Pulse: (!) 106 82 91 82  Resp: 18 18 20 20   Temp: 97.8 F (36.6 C) 97.8 F (36.6 C) 98 F (36.7 C) 98 F (36.7 C)  TempSrc: Oral Oral Oral Oral  SpO2:  96% 93% 93%  Weight:    89.3 kg  Height:          Data Reviewed:  Basic Metabolic Panel: Recent Labs  Lab 05/06/23 1603 05/06/23 1814 05/07/23 0143 05/07/23 1102 05/08/23 0027 05/09/23 0048  NA 139  --  138  --  136 136  K 4.1  --  4.8  --  4.1 4.4  CL 99  --  99  --  95* 94*  CO2 29  --  33*  --  32 31  GLUCOSE 112*  --  127*  --  179* 150*  BUN 19  --  17  --  23 34*  CREATININE 1.23  --  1.01  --  1.06 1.59*  CALCIUM 8.5*  --  8.8*  --  8.8* 8.7*  MG  --  0.9*  --  1.6* 1.8  --     CBC: Recent Labs  Lab 05/06/23 1603 05/07/23 0143  WBC 8.9 7.0  NEUTROABS 7.2  --   HGB 9.6* 10.4*  HCT 31.0* 34.2*  MCV 90.6 90.2  PLT 218 230    LFT Recent Labs  Lab 05/06/23 1603 05/07/23 0143  AST 14* 12*  ALT 15 13  ALKPHOS 69 77  BILITOT 0.9 0.8  PROT 5.4* 5.5*   ALBUMIN 2.8* 2.8*     Antibiotics: Anti-infectives (From admission, onward)    Start     Dose/Rate Route Frequency Ordered Stop   05/08/23 1000  doxycycline (VIBRA-TABS) tablet 100 mg        100 mg Oral Every 12 hours 05/08/23 0817     05/06/23 1745  cefTRIAXone (ROCEPHIN) 1 g in sodium chloride 0.9 % 100 mL IVPB  Status:  Discontinued        1 g 200 mL/hr over 30 Minutes Intravenous  Once 05/06/23 1737 05/06/23 1811   05/06/23 1745  azithromycin (ZITHROMAX) 500 mg in sodium chloride 0.9 % 250 mL IVPB  Status:  Discontinued        500 mg 250 mL/hr over 60 Minutes Intravenous  Once 05/06/23 1737 05/06/23 1811        DVT prophylaxis: Apixaban  Code Status: Full code  Family Communication:    CONSULTS    Subjective      Objective    Physical Examination:     Status is: Inpatient:             Meredeth Ide   [facility] If 7PM-7AM, please contact night-coverage at [email or web address], Office  [phone]   05/10/2023, 7:54 AM  LOS: 3 days

## 2023-05-10 NOTE — Discharge Instructions (Signed)
You need lab work to check in 1 week for checking kidney function.  Your PCP can check the labs.

## 2023-05-10 NOTE — Progress Notes (Signed)
Pt discharged to home in stable condition, all discharge instructions and prescriptions reviewed with and given to pt. Pt gives complete verbal understanding of all of the above. Pt transported off unit via wheelchair to private vehicle for transport home.

## 2023-05-11 LAB — CULTURE, BLOOD (ROUTINE X 2)
Culture: NO GROWTH
Culture: NO GROWTH
Special Requests: ADEQUATE

## 2023-05-15 ENCOUNTER — Emergency Department (HOSPITAL_COMMUNITY)
Admission: EM | Admit: 2023-05-15 | Discharge: 2023-05-15 | Disposition: A | Discharge status: Home / Self Care | Payer: PPO | Attending: Emergency Medicine | Admitting: Emergency Medicine

## 2023-05-15 ENCOUNTER — Emergency Department (HOSPITAL_COMMUNITY): Payer: PPO

## 2023-05-15 ENCOUNTER — Other Ambulatory Visit: Payer: Self-pay

## 2023-05-15 ENCOUNTER — Encounter (HOSPITAL_COMMUNITY): Payer: Self-pay

## 2023-05-15 DIAGNOSIS — Z79899 Other long term (current) drug therapy: Secondary | ICD-10-CM | POA: Diagnosis not present

## 2023-05-15 DIAGNOSIS — J449 Chronic obstructive pulmonary disease, unspecified: Secondary | ICD-10-CM | POA: Insufficient documentation

## 2023-05-15 DIAGNOSIS — I5030 Unspecified diastolic (congestive) heart failure: Secondary | ICD-10-CM | POA: Diagnosis not present

## 2023-05-15 DIAGNOSIS — Z85118 Personal history of other malignant neoplasm of bronchus and lung: Secondary | ICD-10-CM | POA: Insufficient documentation

## 2023-05-15 DIAGNOSIS — Z7901 Long term (current) use of anticoagulants: Secondary | ICD-10-CM | POA: Insufficient documentation

## 2023-05-15 DIAGNOSIS — R0789 Other chest pain: Secondary | ICD-10-CM

## 2023-05-15 LAB — CBC
HCT: 38.3 % — ABNORMAL LOW (ref 39.0–52.0)
Hemoglobin: 11.7 g/dL — ABNORMAL LOW (ref 13.0–17.0)
MCH: 27.1 pg (ref 26.0–34.0)
MCHC: 30.5 g/dL (ref 30.0–36.0)
MCV: 88.9 fL (ref 80.0–100.0)
Platelets: 289 10*3/uL (ref 150–400)
RBC: 4.31 MIL/uL (ref 4.22–5.81)
RDW: 13.7 % (ref 11.5–15.5)
WBC: 16.6 10*3/uL — ABNORMAL HIGH (ref 4.0–10.5)
nRBC: 0 % (ref 0.0–0.2)

## 2023-05-15 LAB — BASIC METABOLIC PANEL
Anion gap: 9 (ref 5–15)
BUN: 35 mg/dL — ABNORMAL HIGH (ref 8–23)
CO2: 31 mmol/L (ref 22–32)
Calcium: 8.9 mg/dL (ref 8.9–10.3)
Chloride: 97 mmol/L — ABNORMAL LOW (ref 98–111)
Creatinine, Ser: 1.29 mg/dL — ABNORMAL HIGH (ref 0.61–1.24)
GFR, Estimated: 59 mL/min — ABNORMAL LOW (ref >60)
Glucose, Bld: 102 mg/dL — ABNORMAL HIGH (ref 70–99)
Potassium: 4.3 mmol/L (ref 3.5–5.1)
Sodium: 137 mmol/L (ref 135–145)

## 2023-05-15 LAB — TROPONIN I (HIGH SENSITIVITY)
Troponin I (High Sensitivity): 9 ng/L (ref <18)
Troponin I (High Sensitivity): 11 ng/L (ref <18)

## 2023-05-15 NOTE — ED Notes (Signed)
Pt provided discharge instructions and prescription information. Pt was given the opportunity to ask questions and questions were answered.   

## 2023-05-15 NOTE — ED Provider Notes (Signed)
[place] EMERGENCY DEPARTMENT AT [facility] Provider Note   CSN: [id number] Arrival date & time: 05/15/23  1430     History  Chief Complaint  Patient presents with   Chest Pain   Hypoxia    [name] is a 72 y.o. male recently admitted for diastolic CHF and COPD exacerbation, history of A-fib on Eliquis, history of lung cancer, presents today for an episode of chest tightness and reported low oxygen saturation at home.  This episode lasted a couple hours, does not have any symptoms currently in the ER.  This started when he was sitting on his couch and he noticed the chest tightness.  Then checked his oxygen saturation is in the 70s.  He put on 3 L nasal cannula to bring his oxygen saturation back up.  He is normally on 2 L nasal cannula as needed at home.  Denies any fevers, chills, or cough at home.  He was recently admitted for diastolic CHF exacerbation and COPD exacerbation.  He was discharged home on Solu-Medrol Dosepak,  Mucinex, doxycycline, prednisone taper which he reports taking.  He felt back to his baseline after the hospitalization until today.   Chest Pain      Home Medications Prior to Admission medications   Medication Sig Start Date End Date Taking? Authorizing Provider  allopurinol (ZYLOPRIM) 300 MG tablet Take 300 mg by mouth daily. 01/15/17   [provider]  amLODipine (NORVASC) 5 MG tablet Take 5 mg by mouth daily. 01/21/20   [provider]  apixaban (ELIQUIS) 5 MG TABS tablet Take 1 tablet (5 mg total) by mouth 2 (two) times daily. 02/02/23 01/28/24  Revankar, Aundra Dubin, MD  betamethasone dipropionate 0.05 % lotion Apply 1 application  topically 2 (two) times daily. To legs for psoriasis    [provider]  Buprenorphine HCl-Naloxone HCl 8-2 MG FILM Place 0.5-1 Film under the tongue 2 (two) times daily as needed (back pain). 03/24/23   [provider]  ezetimibe (ZETIA) 10 MG tablet Take 10 mg by mouth daily.  02/09/23   [provider]  finasteride (PROSCAR) 5 MG tablet Take 5 mg by mouth daily. 03/13/19   [provider]  furosemide (LASIX) 20 MG tablet Take 1 tablet (20 mg total) by mouth daily. 04/12/23 04/11/24  Marykay Lex, MD  guaiFENesin (MUCINEX) 600 MG 12 hr tablet Take 1 tablet (600 mg total) by mouth 2 (two) times daily for 5 days. 05/10/23 05/15/23  Meredeth Ide, MD  ipratropium-albuterol (DUONEB) 0.5-2.5 (3) MG/3ML SOLN Take 3 mLs by nebulization every 6 (six) hours as needed. 05/10/23   Meredeth Ide, MD  levothyroxine (SYNTHROID) 175 MCG tablet Take 175 mcg by mouth daily. 10/29/22   [provider]  LINZESS 290 MCG CAPS capsule Take 290 mcg by mouth daily. 01/19/23   [provider]  lisinopril (PRINIVIL,ZESTRIL) 10 MG tablet Take 1 tablet (10 mg total) by mouth daily. 12/29/11   Nahser, Deloris Ping, MD  metoprolol succinate (TOPROL XL) 25 MG 24 hr tablet Take 1 tablet (25 mg total) by mouth daily. 02/08/23   Revankar, Aundra Dubin, MD  omeprazole (PRILOSEC) 20 MG capsule Take 1 capsule (20 mg total) by mouth 2 (two) times daily. Patient taking differently: Take 20 mg by mouth daily. 10/13/19   Leslye Peer, MD  predniSONE (DELTASONE) 10 MG tablet Prednisone 40 mg po daily x 1 day then Prednisone 30 mg po daily x 1 day then Prednisone  20 mg po daily x 1 day then Prednisone 10 mg daily x 1 day then stop... 05/10/23   Meredeth Ide, MD  PROAIR HFA 108 [phone] Base) MCG/ACT inhaler Inhale 2 puffs into the lungs every 6 (six) hours as needed for wheezing or shortness of breath. 10/02/17   Albertine Grates, MD  rosuvastatin (CRESTOR) 10 MG tablet Take 1 tablet (10 mg total) by mouth daily. Patient not taking: Reported on 05/06/2023 04/13/23 07/12/23  Revankar, Aundra Dubin, MD  TRELEGY ELLIPTA 100-62.5-25 MCG/INH AEPB Inhale 1 puff into the lungs daily. 05/08/19   [provider]  zolpidem (AMBIEN) 10 MG tablet Take 10 mg by mouth at bedtime as needed for sleep. 03/24/23   [provider]      Allergies    Lipitor [atorvastatin] and Omnipaque [iohexol]    Review of Systems   Review of Systems  Respiratory:  Positive for chest tightness.   Cardiovascular:  Negative for chest pain.    Physical Exam Updated Vital Signs BP 130/74   Pulse 82   Temp 98.3 F (36.8 C) (Oral)   Resp 18   SpO2 100%  Physical Exam Vitals and nursing note reviewed.  Constitutional:      General: He is not in acute distress.    Appearance: He is well-developed.     Comments: No acute distress, talking in full sentences On 2 L nasal cannula  HENT:     Head: Normocephalic and atraumatic.  Eyes:     Conjunctiva/sclera: Conjunctivae normal.  Cardiovascular:     Rate and Rhythm: Normal rate and regular rhythm.     Heart sounds: No murmur heard. Pulmonary:     Effort: Pulmonary effort is normal. No respiratory distress.     Comments: Diffuse wheezing in the lungs bilaterally Abdominal:     Palpations: Abdomen is soft.     Tenderness: There is no abdominal tenderness.  Musculoskeletal:        General: No swelling.     Cervical back: Neck supple.  Skin:    General: Skin is warm and dry.     Capillary Refill: Capillary refill takes less than 2 seconds.  Neurological:     Mental Status: He is alert.  Psychiatric:        Mood and Affect: Mood normal.     ED Results / Procedures / Treatments   Labs (all labs ordered are listed, but only abnormal results are displayed) Labs Reviewed  BASIC METABOLIC PANEL - Abnormal; Notable for the following components:      Result Value   Chloride 97 (*)    Glucose, Bld 102 (*)    BUN 35 (*)    Creatinine, Ser 1.29 (*)    GFR, Estimated 59 (*)    All other components within normal limits  CBC - Abnormal; Notable for the following components:   WBC 16.6 (*)    Hemoglobin 11.7 (*)    HCT 38.3 (*)    All other components within normal limits  TROPONIN I (HIGH SENSITIVITY)  TROPONIN I (HIGH SENSITIVITY)     EKG None  Radiology DG Chest 2 View  Result Date: 05/15/2023 CLINICAL DATA:  Chest pain.  Hypoxia.  History of right lung cancer. EXAM: CHEST - 2 VIEW COMPARISON:  Most recent radiograph 05/06/2023.  PET CT 02/12/2023 FINDINGS: Right chest port with tip in the SVC. Unchanged right upper lobe volume loss and opacity, posttreatment sequela. Stable heart size and mediastinal contours. Bilateral pleural effusions have  diminished. Persistent patchy left lung base opacity. Slight improvement in right lung base opacity. No pneumothorax. IMPRESSION: 1. Slight improvement in right lung base opacity from prior exam. Persistent patchy left lung base opacity. This may represent infection or asymmetric edema. 2. Decreased bilateral pleural effusions. 3. Unchanged right upper lobe volume loss and opacity, posttreatment sequela. Electronically Signed   By: Narda Rutherford M.D.   On: 05/15/2023 16:04    Procedures Procedures    Medications Ordered in ED Medications - No data to display  ED Course/ Medical Decision Making/ A&P                                 Medical Decision Making  72 y.o. male with pertinent past medical history of recently admitted for diastolic CHF and COPD exacerbation, history of A-fib on Eliquis, history of lung cancer, presents to the ED for concern of episode of chest tightness earlier today  Differential diagnosis includes but is not limited to ACS, pneumonia, PE  ED Course:  Patient presents with concern for an episode of chest tightness in the middle of his chest that occurred this morning while sitting on the couch.  This lasted a couple hours.  He reports having a low oxygen saturation at home, however, here he is currently on his baseline 2 L nasal cannula and satting 100%.  He is talking in full sentences, no tachypnea, comfortable appearing.  Chest x-ray shows improvement of his bilateral pleural effusions and the consolidation.  He does not have any fever or  chills, cough, low suspicion for pneumonia. No need for antibiotics at this time. He does have a leukocytosis of 16, however suspect this is secondary to his recent prednisone taper.  He does not have any hypoxia, no elevated heart rate, no shortness of breath, low suspicion for PE, no need for further workup for PE.  EKG shows atrial fibrillation which is the patient's baseline, he is already on Eliquis for this.  Initial troponin of 9 and repeat of 11, no significant elevation.  He recently had a left heart cath on 04/12/2023 which revealed normal coronaries, given this recent imaging and the EKG at patient baseline, low troponin, no concern for ACS at this time.  Given workup unremarkable, patient appropriate for discharge home at this time.  He has close follow-up with cardiology scheduled on 8/27. Return precautions discussed with patient.    Impression: Chest tightness, resolved  Disposition:  The patient was discharged home with instructions to follow-up with his cardiologist on 8/27. Return precautions given.  Lab Tests: I Ordered, and personally interpreted labs.  The pertinent results include:   CBC with leukocytosis of 16 BMP with elevated creatinine of 1.29, this is consistent with patient baseline Initial troponin 9, repeat at 11  Imaging Studies ordered: I ordered imaging studies including chest x-ray I independently visualized the imaging with scope of interpretation limited to determining acute life threatening conditions related to emergency care. Imaging showed improvement in pleural effusion, improvement in right lung base opacity I agree with the radiologist interpretation   Cardiac Monitoring: / EKG: The patient was maintained on a cardiac monitor.  I personally viewed and interpreted the cardiac monitored which showed an underlying rhythm of: Atrial fibrillation  External records from outside source obtained and reviewed including coronary cath scan from 04/12/2023,  recent hospitalization discharge note from 05/10/2023   Co morbidities that complicate the patient evaluation  diastolic  CHF, COPD, history of A-fib on Eliquis, history of lung cancer             Final Clinical Impression(s) / ED Diagnoses Final diagnoses:  Feeling of chest tightness    Rx / DC Orders ED Discharge Orders     None         Arabella Merles, PA-C 05/15/23 Luetta Nutting, MD 05/16/23 1124

## 2023-05-15 NOTE — ED Triage Notes (Signed)
Pt states he started having some chest tightness about 2 hrs PTA, checked his oxygen level at home and it was reading in the 70s. Pt only wears 2L of oxygen at night. Pt 85% on room air in triage, placed on 3L [place] with an SPO2 of 96%.

## 2023-05-15 NOTE — Discharge Instructions (Addendum)
Please attend your cardiology appointment on 8/27 at 3:35pm with Jose Bamberg, NP at [facility] at [facility]    Return to the ER if you develop chest pain, worsening shortness of breath, fever or chills, any other new or concerning symptoms.

## 2023-05-17 ENCOUNTER — Other Ambulatory Visit: Payer: Self-pay

## 2023-05-17 ENCOUNTER — Ambulatory Visit
Admission: EM | Admit: 2023-05-17 | Discharge: 2023-05-17 | Disposition: A | Discharge status: Home / Self Care | Payer: PPO | Attending: Physician Assistant | Admitting: Physician Assistant

## 2023-05-17 ENCOUNTER — Ambulatory Visit: Payer: PPO

## 2023-05-17 DIAGNOSIS — Z1152 Encounter for screening for COVID-19: Secondary | ICD-10-CM | POA: Diagnosis not present

## 2023-05-17 DIAGNOSIS — I5032 Chronic diastolic (congestive) heart failure: Secondary | ICD-10-CM | POA: Diagnosis not present

## 2023-05-17 DIAGNOSIS — J441 Chronic obstructive pulmonary disease with (acute) exacerbation: Secondary | ICD-10-CM | POA: Insufficient documentation

## 2023-05-17 DIAGNOSIS — I4891 Unspecified atrial fibrillation: Secondary | ICD-10-CM | POA: Insufficient documentation

## 2023-05-17 DIAGNOSIS — J440 Chronic obstructive pulmonary disease with acute lower respiratory infection: Secondary | ICD-10-CM | POA: Diagnosis not present

## 2023-05-17 DIAGNOSIS — J189 Pneumonia, unspecified organism: Secondary | ICD-10-CM | POA: Insufficient documentation

## 2023-05-17 MED ORDER — DOXYCYCLINE HYCLATE 100 MG PO CAPS: 100.0000 mg | ORAL_CAPSULE | Freq: Two times a day (BID) | ORAL | 0 refills | Status: DC

## 2023-05-17 MED ORDER — ONDANSETRON 4 MG PO TBDP
4.0000 mg | ORAL_TABLET | Freq: Three times a day (TID) | ORAL | 0 refills | Status: DC | PRN
Start: 2023-05-17 — End: 2024-05-02

## 2023-05-17 NOTE — Progress Notes (Signed)
 " Cardiology Office Note:  .   Date:  05/17/2023  ID:  [name], DOB [birth year], MRN [id number] PCP: Jose Bernardino Hutchinson, PA  [facility] Providers Cardiologist:  None    History of Present Illness: .   [name] is a 72 y.o. male with a past medical history of hypertension, atrial fibrillation, congestive heart failure, pulmonary hypertension, lung cancer, GERD, hypothyroidism, BPH, history of non-Hodgkin's lymphoma, prior tobacco use with cessation in 2003.  04/12/23 LHC normal coronary arteries, LVEDP 27-29mmHG suggestive of AF related diastolic HF 02/17/23 lexiscan  diminshed EF, no ischemia, intermediate risk 02/17/23 echo EF 60-65%, moderate concentric LVH, LA mild dilatation, mild dilatation of aortic root 38 mm  Evaluated in the emergency department on 05/15/2023 for chest tightness, recently diagnosed with URI and given steroids. Troponins were negative, EKG was unchanged.  He presents today accompanied by his wife for follow-up after his recent hospitalization.  He is still finishing antibiotics for his pneumonia, slowly feeling slightly better each day, although he does continue to endorse some fatigue.  He is continuing with pulmonary toileting, trying to still well-hydrated.  Use to have some shortness of breath as well as pedal edema--however his edema is much improved. He denies chest pain, palpitations, pnd, orthopnea, n, v, dizziness, syncope, weight gain, or early satiety.    ROS: Review of Systems  Constitutional:  Positive for malaise/fatigue.  HENT: Negative.    Eyes: Negative.   Respiratory:  Positive for sputum production and shortness of breath.   Cardiovascular: Negative.   Gastrointestinal: Negative.   Genitourinary: Negative.   Musculoskeletal: Negative.   Skin: Negative.   Neurological: Negative.   Psychiatric/Behavioral: Negative.       Studies Reviewed: .        Cardiac Studies & Procedures   CARDIAC CATHETERIZATION  CARDIAC  CATHETERIZATION 04/12/2023  Narrative   Angiographically normal coronary arteries; codominant system   The left ventricular systolic function is normal. The left ventricular ejection fraction is greater than 65% by visual estimate.   LV end diastolic pressure is severely elevated.   There is no aortic valve stenosis.   Will start low-dose Lasix  on discharge (40 mg IV given in the Cath Lab)   Recommend to resume Apixaban , at currently prescribed dose and frequency on 04/12/2023.   Okay to restart Eliquis  p.m. 04/12/2023.  POST-CATH DIAGNOSES Angiographically Normal Coronaries and a Codominant System. Hyperdynamic LV in setting of A-fib RVR with severely elevated LVEDP of 27-29 mmHg suggestive of A-fib related Diastolic Heart Failure   RECOMMENDATIONS Will start low-dose Lasix  on discharge (40 mg IV given in the Cath Lab) Recommend to resume Apixaban , at currently prescribed dose and frequency on 04/12/2023. Okay to restart Eliquis  p.m. 04/12/2023.  Follow-up with Dr. Revankar.   Jose Clay, MD  Findings Coronary Findings Diagnostic  Dominance: Co-dominant  Left Main Vessel was injected. Vessel is large. Vessel is angiographically normal.  Left Anterior Descending Vessel was injected. Vessel is normal in caliber. Vessel is angiographically normal.  First Diagonal Branch Vessel is small in size.  Second Diagonal Branch Vessel is small in size.  Second Septal Branch Vessel is small in size.  Ramus Intermedius Vessel is small.  Left Circumflex Vessel was injected. Vessel is normal in caliber and large. Vessel is angiographically normal.  First Obtuse Marginal Branch Vessel is large in size.  Third Left Posterolateral Branch Essentially courses as a tandem PDA  Left Posterior Atrioventricular Artery Vessel is large in size.  Right Coronary  Artery Vessel was injected. Vessel is normal in caliber. Vessel is angiographically normal.  Acute Marginal Branch Vessel  is small in size.  Right Posterior Descending Artery Moderate caliber  First Right Posterolateral Branch Vessel is moderate in size.  Intervention  No interventions have been documented.   STRESS TESTS  MYOCARDIAL PERFUSION IMAGING 02/17/2023  Narrative   Findings are consistent with no ischemia and no infarction. The study is intermediate risk secondary to diminished EF.   No ST deviation was noted.   Left ventricular function is abnormal. Global function is moderately reduced. Nuclear stress EF: 35 %. The left ventricular ejection fraction is moderately decreased (30-44%). End diastolic cavity size is normal.   Prior study not available for comparison.   ECHOCARDIOGRAM  ECHOCARDIOGRAM COMPLETE 02/17/2023  Narrative ECHOCARDIOGRAM REPORT    Patient Name:   [name] Date of Exam: 02/17/2023 Medical Rec #:  [id number]      Height:       72.0 in Accession #:    [id number]     Weight:       200.0 lb Date of Birth:  1951-07-16     BSA:          2.131 m Patient Age:    71 years       BP:           136/62 mmHg Patient Gender: M              HR:           84 bpm. Exam Location:  [place]  Procedure: 2D Echo, Cardiac Doppler, Color Doppler and Strain Analysis  Indications:    Atrial fibrillation, unspecified type (HCC) [I48.91 (ICD-10-CM)]  History:        Patient has prior history of Echocardiogram examinations, most recent 09/30/2015. CHF, COPD, Signs/Symptoms:Pulmonary hypertension; Risk Factors:Hypertension.  Sonographer:    Lynwood Silvas RDCS Referring Phys: CYRUS Kenson Groh SAUNDERS [facility]   Sonographer Comments: Technically difficult study due to poor echo windows. Global longitudinal strain was attempted. IMPRESSIONS   1. Left ventricular ejection fraction, by estimation, is 60 to 65%. The left ventricle has normal function. The left ventricle has no regional wall motion abnormalities. There is moderate concentric left ventricular hypertrophy. Left ventricular diastolic  parameters are indeterminate. 2. Right ventricular systolic function is normal. The right ventricular size is normal. There is normal pulmonary artery systolic pressure. 3. Left atrial size was mildly dilated. 4. Right atrial size was mildly dilated. 5. The mitral valve is normal in structure. No evidence of mitral valve regurgitation. No evidence of mitral stenosis. 6. The aortic valve is tricuspid. Aortic valve regurgitation is not visualized. Aortic valve sclerosis is present, with no evidence of aortic valve stenosis. 7. Aortic Normal DTA and ascending aorta. There is mild dilatation of the aortic root, measuring 38 mm. 8. The inferior vena cava is normal in size with greater than 50% respiratory variability, suggesting right atrial pressure of 3 mmHg.  FINDINGS Left Ventricle: Left ventricular ejection fraction, by estimation, is 60 to 65%. The left ventricle has normal function. The left ventricle has no regional wall motion abnormalities. Definity  contrast agent was given IV to delineate the left ventricular endocardial borders. The left ventricular internal cavity size was normal in size. There is moderate concentric left ventricular hypertrophy. Left ventricular diastolic parameters are indeterminate. Indeterminate filling pressures.  Right Ventricle: The right ventricular size is normal. No increase in right ventricular wall thickness. Right ventricular systolic function is normal. There is  normal pulmonary artery systolic pressure. The tricuspid regurgitant velocity is 2.28 m/s, and with an assumed right atrial pressure of 3 mmHg, the estimated right ventricular systolic pressure is 23.8 mmHg.  Left Atrium: Left atrial size was mildly dilated.  Right Atrium: Right atrial size was mildly dilated.  Pericardium: There is no evidence of pericardial effusion.  Mitral Valve: The mitral valve is normal in structure. No evidence of mitral valve regurgitation. No evidence of mitral valve  stenosis.  Tricuspid Valve: The tricuspid valve is normal in structure. Tricuspid valve regurgitation is mild . No evidence of tricuspid stenosis.  Aortic Valve: The aortic valve is tricuspid. Aortic valve regurgitation is not visualized. Aortic valve sclerosis is present, with no evidence of aortic valve stenosis.  Pulmonic Valve: The pulmonic valve was normal in structure. Pulmonic valve regurgitation is not visualized. No evidence of pulmonic stenosis.  Aorta: The aortic arch was not well visualized and Normal DTA and ascending aorta. There is mild dilatation of the aortic root, measuring 38 mm.  Venous: The pulmonary veins were not well visualized. The inferior vena cava is normal in size with greater than 50% respiratory variability, suggesting right atrial pressure of 3 mmHg.  IAS/Shunts: No atrial level shunt detected by color flow Doppler.   LEFT VENTRICLE PLAX 2D LVIDd:         4.90 cm   Diastology LVIDs:         4.00 cm   LV e' medial:    5.11 cm/s LV PW:         1.30 cm   LV E/e' medial:  13.4 LV IVS:        1.40 cm   LV e' lateral:   6.42 cm/s LVOT diam:     2.00 cm   LV E/e' lateral: 10.7 LV SV:         51 LV SV Index:   24 LVOT Area:     3.14 cm   RIGHT VENTRICLE             IVC RV Basal diam:  3.70 cm     IVC diam: 1.80 cm RV S prime:     13.10 cm/s TAPSE (M-mode): 2.5 cm  LEFT ATRIUM             Index        RIGHT ATRIUM           Index LA diam:        3.60 cm 1.69 cm/m   RA Area:     23.40 cm LA Vol (A2C):   89.8 ml 42.15 ml/m  RA Volume:   77.80 ml  36.52 ml/m LA Vol (A4C):   65.3 ml 30.65 ml/m LA Biplane Vol: 83.5 ml 39.19 ml/m AORTIC VALVE LVOT Vmax:   81.23 cm/s LVOT Vmean:  53.067 cm/s LVOT VTI:    0.163 m  AORTA Ao Root diam: 3.80 cm Ao Asc diam:  3.60 cm Ao Desc diam: 2.10 cm  MV E velocity: 68.70 cm/s  TRICUSPID VALVE MV A velocity: 33.50 cm/s  TR Peak grad:   20.8 mmHg MV E/A ratio:  2.05        TR Vmax:        228.00  cm/s  SHUNTS Systemic VTI:  0.16 m Systemic Diam: 2.00 cm  Redell Leiter MD Electronically signed by Redell Leiter MD Signature Date/Time: 02/17/2023/11:55:24 AM    Final             Risk Assessment/Calculations:  CHA2DS2-VASc Score = 3   This indicates a 3.2% annual risk of stroke. The patient's score is based upon: CHF History: 1 HTN History: 1 Diabetes History: 0 Stroke History: 0 Vascular Disease History: 0 Age Score: 1 Gender Score: 0            Physical Exam:   VS:  There were no vitals taken for this visit.   Wt Readings from Last 3 Encounters:  05/17/23 196 lb 13.9 oz (89.3 kg)  05/10/23 196 lb 12.8 oz (89.3 kg)  04/12/23 200 lb (90.7 kg)    GEN: Well nourished, well developed in no acute distress NECK: No JVD; No carotid bruits CARDIAC: Irregularly irregular, no murmurs, rubs, gallops RESPIRATORY: Trace bilateral rales, expiratory wheezing ABDOMEN: Soft, non-tender, non-distended EXTREMITIES: +1 pitting edema; No deformity   ASSESSMENT AND PLAN: .   Atrial fibrillation/hypercoagulable state-CHA2DS2-VASc score of 3, he is in atrial fibrillation today rate controlled.  Continue Eliquis  5 mg twice daily--no indication for dose reduction, continue metoprolol  25 mg daily. Coronary artery calcification-this was apparently noticed on some type of imaging, he had a stress evaluation which was intermediate risk secondary to decreased ejection fraction, he was eventually sent for a left heart cath which revealed normal coronary arteries. Dyslipidemia-currently on Crestor  10 mg daily as well as Zetia  10 mg daily, will repeat FLP and LFTs. HFpEF-NYHA class II, appears to be slightly volume overloaded with trace rales appreciated and pedal edema.  Will check proBNP.  Continue lisinopril  10 mg daily, continue metoprolol  25 mg daily, continue Lasix  20 mg daily. Hypertension-blood pressure is controlled at 120/70, continue Norvasc  5 mg daily, continue lisinopril  10 mg  daily. Hypomagnesemia-noted during his hospitalization, will repeat magnesium  level today.       Dispo: Labs per above, Follow-up in 3 months with Dr. Edwyna.  Signed, Delon JAYSON Hoover, NP  "

## 2023-05-17 NOTE — ED Triage Notes (Signed)
"  I was in here on the 15th and my oxygen level had dropped, sent to the hospital and stayed for 4 days". "I ended up having Pna and just finished antibiotics but I am concerned with this not being improved". Last night "chills all night". Oxygen level has been dropping "again". No fever (known).

## 2023-05-17 NOTE — ED Provider Notes (Addendum)
[facility]    CSN: [id number] Arrival date & time: 05/17/23  0804      History   Chief Complaint Chief Complaint  Patient presents with   Chills    HPI [name] is a 72 y.o. male.   Patient here today for follow-up of recent discharge from hospital after being diagnosed with pneumonia, exacerbations of other comorbidities.  He reports he is concerned that pneumonia may be returning as he has had some chills.  He has had some cough but is not productive.  He has COPD at baseline as well as heart failure.  He has not had any fever that he is aware of.  The history is provided by the patient.    Past Medical History:  Diagnosis Date   Adjustment disorder with other symptom 11/27/2015   AKI (acute kidney injury) (HCC) 01/14/2016   Aortic atherosclerosis (HCC) 01/26/2023   Arthritis    Atrial fibrillation (HCC) 10/26/2011   BPH (benign prostatic hyperplasia) 2011   BRONCHOPNEUMONIA ORGANISM UNSPECIFIED 06/12/2010         Replacing diagnoses that were inactivated after the 12/21/22 regulatory import   Carpal tunnel syndrome, right upper limb 11/03/2022   Chronic respiratory failure with hypoxia (HCC) 08/27/2015   Congestive heart failure (HCC)    COPD (chronic obstructive pulmonary disease) (HCC)    COPD, severe (HCC) 11/27/2015   12/18/2015-pulmonary function test- FVC 2.75 (55% predicted), postbronchodilator ratio 56, postbronchodilator FEV1 1.66 (45% predicted), no significant bronchodilator response, mid flow reversibility, DLCO 33  >>>Severe obstructive airways disease, Severe diffusion defect      Coronary artery calcification 01/26/2023   Depression    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)    Drug-induced neutropenia (HCC) 10/08/2015   Encounter for antineoplastic chemotherapy 10/08/2015   Essential hypertension 06/05/2008   Qualifier: Diagnosis of   By: Truman Hayward Duncan Dull), [name]       Gastrointestinal obstruction  (HCC)    Ileus   GERD (gastroesophageal reflux disease)    Gout attack 08/29/2012   Hx of colonoscopy    Hypothyroidism 03/30/2012   Lesion of ulnar nerve, right upper limb 11/03/2022   Lung cancer (HCC) 2011   Lymphadenopathy syndrome 09/12/2015   Mediastinal adenopathy 09/12/2015   NHL (non-Hodgkin's lymphoma) (HCC) 09/25/2015   Numbness of right hand 06/28/2022   Obesity    Open wound of right hand without foreign body 07/27/2021   Other dysphagia 11/11/2010   Qualifier: Diagnosis of   By: Marchelle Gearing MD, [name]       Polycythemia 01/26/2023   POLYCYTHEMIA 06/05/2008   Qualifier: Diagnosis of   By: Truman Hayward Duncan Dull), [name]       Port catheter in place 08/20/2016   Post-nasal drip 10/04/2011   Primary cancer of right upper lobe of lung (HCC) 10/25/2019   Squamous cell   Pulmonary hypertension (HCC)    Pulmonary nodule, right 10/03/2019   Pure hypercholesterolemia 12/03/2011   Recurrent pneumonia 02/10/2012   Routine general medical examination at a health care facility 12/03/2011   Sepsis (HCC) 01/14/2016   Severe sepsis (HCC) 01/14/2016   Squamous cell carcinoma lung (HCC) 09/26/2010   S/p LUL resection Dr [name] dec 2011      Status post bronchoscopy with biopsy    Stiffness of finger joint of right hand 09/24/2021   Streptococcal pneumonia (HCC) 04/2008   Testicular swelling 09/12/2015    Patient Active Problem List   Diagnosis Date Noted   Acute diastolic CHF (  congestive heart failure) (HCC) 05/07/2023   Acute on chronic diastolic CHF (congestive heart failure) (HCC) 05/06/2023   Acute on chronic respiratory failure with hypoxia (HCC) 05/06/2023   DOE (dyspnea on exertion) 04/07/2023   Arthritis 01/26/2023   Congestive heart failure (HCC) 01/26/2023   GERD (gastroesophageal reflux disease) 01/26/2023   Obesity 01/26/2023   Polycythemia 01/26/2023   Pulmonary hypertension (HCC) 01/26/2023   Aortic atherosclerosis (HCC) 01/26/2023   Coronary artery calcification  01/26/2023   Carpal tunnel syndrome, right upper limb 11/03/2022   Lesion of ulnar nerve, right upper limb 11/03/2022   Numbness of right hand 06/28/2022   Stiffness of finger joint of right hand 09/24/2021   Open wound of right hand without foreign body 07/27/2021   Status post bronchoscopy with biopsy    Port catheter in place 08/20/2016   NHL (non-Hodgkin's lymphoma) (HCC) 09/25/2015   Lymphadenopathy syndrome 09/12/2015   Mediastinal adenopathy 09/12/2015   Testicular swelling 09/12/2015   Chronic respiratory failure with hypoxia (HCC) 08/27/2015   Hypothyroidism 03/30/2012   Recurrent pneumonia 02/10/2012   Routine general medical examination at a health care facility 12/03/2011   Depression 12/03/2011   Pure hypercholesterolemia 12/03/2011   Atrial fibrillation (HCC) 10/26/2011   Other dysphagia 11/11/2010   Squamous cell carcinoma lung (HCC) 09/26/2010   BRONCHOPNEUMONIA ORGANISM UNSPECIFIED 06/12/2010   BPH (benign prostatic hyperplasia) 2011   ERECTILE DYSFUNCTION 09/24/2008   Essential hypertension 06/05/2008   COPD (chronic obstructive pulmonary disease) (HCC) 06/05/2008    Past Surgical History:  Procedure Laterality Date   APPENDECTOMY     BACK SURGERY     CARDIOVERSION  11/27/2011   Procedure: CARDIOVERSION;  Surgeon: Vesta Mixer, MD;  Location: [facility] ENDOSCOPY;  Service: Cardiovascular;  Laterality: N/A;   COLONOSCOPY     FUDUCIAL PLACEMENT Right 10/13/2019   Procedure: Placement Of Fuducial;  Surgeon: Leslye Peer, MD;  Location: [facility] OR;  Service: Thoracic;  Laterality: Right;  Right Upper Lobe    LEFT HEART CATH AND CORONARY ANGIOGRAPHY N/A 04/12/2023   Procedure: LEFT HEART CATH AND CORONARY ANGIOGRAPHY;  Surgeon: Marykay Lex, MD;  Location: [facility] INVASIVE CV LAB;  Service: Cardiovascular;  Laterality: N/A;   LUNG CANCER SURGERY  2011   UPPER GI ENDOSCOPY     VIDEO BRONCHOSCOPY WITH ENDOBRONCHIAL NAVIGATION N/A 10/13/2019   Procedure: VIDEO BRONCHOSCOPY  WITH ENDOBRONCHIAL NAVIGATION;  Surgeon: Leslye Peer, MD;  Location: [facility] OR;  Service: Thoracic;  Laterality: N/A;   VIDEO BRONCHOSCOPY WITH ENDOBRONCHIAL ULTRASOUND N/A 10/13/2019   Procedure: VIDEO BRONCHOSCOPY WITH ENDOBRONCHIAL ULTRASOUND;  Surgeon: Leslye Peer, MD;  Location: [facility] OR;  Service: Thoracic;  Laterality: N/A;       Home Medications    Prior to Admission medications   Medication Sig Start Date End Date Taking? Authorizing Provider  doxycycline (VIBRAMYCIN) 100 MG capsule Take 1 capsule (100 mg total) by mouth 2 (two) times daily. 05/17/23  Yes Tomi Bamberger, PA-C  ondansetron (ZOFRAN-ODT) 4 MG disintegrating tablet Take 1 tablet (4 mg total) by mouth every 8 (eight) hours as needed. 05/17/23  Yes Tomi Bamberger, PA-C  allopurinol (ZYLOPRIM) 300 MG tablet Take 300 mg by mouth daily. 01/15/17   [provider]  amLODipine (NORVASC) 5 MG tablet Take 5 mg by mouth daily. 01/21/20   [provider]  apixaban (ELIQUIS) 5 MG TABS tablet Take 1 tablet (5 mg total) by mouth 2 (two) times daily. 02/02/23 01/28/24  Revankar, Aundra Dubin, MD  betamethasone dipropionate  0.05 % lotion Apply 1 application  topically 2 (two) times daily. To legs for psoriasis    [provider]  Buprenorphine HCl-Naloxone HCl 8-2 MG FILM Place 0.5-1 Film under the tongue 2 (two) times daily as needed (back pain). 03/24/23   [provider]  ezetimibe (ZETIA) 10 MG tablet Take 10 mg by mouth daily. 02/09/23   [provider]  finasteride (PROSCAR) 5 MG tablet Take 5 mg by mouth daily. 03/13/19   [provider]  furosemide (LASIX) 20 MG tablet Take 1 tablet (20 mg total) by mouth daily. 04/12/23 04/11/24  Marykay Lex, MD  ipratropium-albuterol (DUONEB) 0.5-2.5 (3) MG/3ML SOLN Take 3 mLs by nebulization every 6 (six) hours as needed. 05/10/23   Meredeth Ide, MD  levothyroxine (SYNTHROID) 175 MCG tablet Take 175 mcg by mouth daily. 10/29/22   [provider]  LINZESS 290 MCG CAPS capsule Take 290 mcg by mouth daily. 01/19/23   [provider]  lisinopril (PRINIVIL,ZESTRIL) 10 MG tablet Take 1 tablet (10 mg total) by mouth daily. 12/29/11   Nahser, Deloris Ping, MD  metoprolol succinate (TOPROL XL) 25 MG 24 hr tablet Take 1 tablet (25 mg total) by mouth daily. 02/08/23   Revankar, Aundra Dubin, MD  omeprazole (PRILOSEC) 20 MG capsule Take 1 capsule (20 mg total) by mouth 2 (two) times daily. Patient taking differently: Take 20 mg by mouth daily. 10/13/19   Leslye Peer, MD  predniSONE (DELTASONE) 10 MG tablet Prednisone 40 mg po daily x 1 day then Prednisone 30 mg po daily x 1 day then Prednisone 20 mg po daily x 1 day then Prednisone 10 mg daily x 1 day then stop... 05/10/23   Meredeth Ide, MD  PROAIR HFA [phone] Base) MCG/ACT inhaler Inhale 2 puffs into the lungs every 6 (six) hours as needed for wheezing or shortness of breath. 10/02/17   Albertine Grates, MD  rosuvastatin (CRESTOR) 10 MG tablet Take 1 tablet (10 mg total) by mouth daily. Patient not taking: Reported on 05/06/2023 04/13/23 07/12/23  Revankar, Aundra Dubin, MD  TRELEGY ELLIPTA 100-62.5-25 MCG/INH AEPB Inhale 1 puff into the lungs daily. 05/08/19   [provider]  zolpidem (AMBIEN) 10 MG tablet Take 10 mg by mouth at bedtime as needed for sleep. 03/24/23   [provider]    Family History Family History  Problem Relation Age of Onset   Heart disease Father    Cancer Neg Hx    Diabetes Neg Hx    Stroke Neg Hx    Hypertension Neg Hx    Kidney disease Neg Hx     Social History Social History   Tobacco Use   Smoking status: Former    Current packs/day: 0.00    Average packs/day: 3.0 packs/day for 43.0 years (129.0 ttl pk-yrs)    Types: Cigarettes    Start date: 10/22/1958    Quit date: 10/22/2001    Years since quitting: 21.5   Smokeless tobacco: Never  Vaping Use   Vaping status: Never Used  Substance Use Topics   Alcohol use: No   Drug use: No     Allergies    Lipitor [atorvastatin] and Omnipaque [iohexol]   Review of Systems Review of Systems  Constitutional:  Positive for chills. Negative for fever.  HENT:  Positive for congestion. Negative for ear pain and sore throat.   Eyes:  Negative for discharge and redness.  Respiratory:  Positive for cough and shortness of  breath.   Gastrointestinal:  Negative for diarrhea, nausea and vomiting.     Physical Exam Triage Vital Signs ED Triage Vitals  Encounter Vitals Group     BP 05/17/23 0813 104/67     Systolic BP Percentile --      Diastolic BP Percentile --      Pulse Rate 05/17/23 0813 85     Resp 05/17/23 0813 (!) 22     Temp 05/17/23 0813 98.3 F (36.8 C)     Temp Source 05/17/23 0813 Oral     SpO2 05/17/23 0813 97 %     Weight 05/17/23 0813 196 lb 13.9 oz (89.3 kg)     Height 05/17/23 0813 6' (1.829 m)     Head Circumference --      Peak Flow --      Pain Score 05/17/23 0810 0     Pain Loc --      Pain Education --      Exclude from Growth Chart --    No data found.  Updated Vital Signs BP 104/67 (BP Location: Left Arm)   Pulse 85   Temp 98.3 F (36.8 C) (Oral)   Resp (!) 22   Ht 6' (1.829 m)   Wt 196 lb 13.9 oz (89.3 kg)   SpO2 97%   BMI 26.70 kg/m       Physical Exam Vitals and nursing note reviewed.  Constitutional:      General: He is not in acute distress.    Appearance: Normal appearance. He is not ill-appearing.  HENT:     Head: Normocephalic and atraumatic.     Nose: Congestion present.     Mouth/Throat:     Mouth: Mucous membranes are moist.     Pharynx: Oropharynx is clear. No oropharyngeal exudate or posterior oropharyngeal erythema.  Eyes:     Conjunctiva/sclera: Conjunctivae normal.  Cardiovascular:     Rate and Rhythm: Rhythm irregular.  Pulmonary:     Effort: Pulmonary effort is normal. No respiratory distress.     Breath sounds: Wheezing (mild scattered wheeze) present. No rhonchi or rales.     Comments: Wearing supplemental oxygen via  nasal cannula Skin:    General: Skin is warm and dry.  Neurological:     Mental Status: He is alert.  Psychiatric:        Mood and Affect: Mood normal.        Thought Content: Thought content normal.      UC Treatments / Results  Labs (all labs ordered are listed, but only abnormal results are displayed) Labs Reviewed  SARS CORONAVIRUS 2 (TAT 6-24 HRS)    EKG   Radiology DG Chest 2 View  Result Date: 05/17/2023 CLINICAL DATA:  Pneumonia. EXAM: CHEST - 2 VIEW COMPARISON:  None Available. FINDINGS: The heart size and mediastinal contours are within normal limits. Stable appearance of Port-A-Cath. Stable chronic opacity at the right apex related to prior surgery and treated lung carcinoma. Improved aeration at both lung bases with no significant residual suggestion of acute pneumonia. Stable chronic lung disease. No pulmonary edema, pleural effusions or pneumothorax. Visualized bony structures are unremarkable. IMPRESSION: Improved aeration at both lung bases with no significant residual suggestion of acute pneumonia. Electronically Signed   By: Irish Lack M.D.   On: 05/17/2023 08:43   DG Chest 2 View  Result Date: 05/15/2023 CLINICAL DATA:  Chest pain.  Hypoxia.  History of right lung cancer. EXAM: CHEST - 2 VIEW COMPARISON:  Most  recent radiograph 05/06/2023.  PET CT 02/12/2023 FINDINGS: Right chest port with tip in the SVC. Unchanged right upper lobe volume loss and opacity, posttreatment sequela. Stable heart size and mediastinal contours. Bilateral pleural effusions have diminished. Persistent patchy left lung base opacity. Slight improvement in right lung base opacity. No pneumothorax. IMPRESSION: 1. Slight improvement in right lung base opacity from prior exam. Persistent patchy left lung base opacity. This may represent infection or asymmetric edema. 2. Decreased bilateral pleural effusions. 3. Unchanged right upper lobe volume loss and opacity, posttreatment sequela.  Electronically Signed   By: Narda Rutherford M.D.   On: 05/15/2023 16:04    Procedures Procedures (including critical care time)  Medications Ordered in UC Medications - No data to display  Initial Impression / Assessment and Plan / UC Course  I have reviewed the triage vital signs and the nursing notes.  Pertinent labs & imaging results that were available during my care of the patient were reviewed by me and considered in my medical decision making (see chart for details).    CXR repeated with no signs of pna- however given co morbidities will resume doxycycline and recommended covid screening. Zofran prescribed at patients request for nausea that often accompanies doxycycline. EKG appears similar to prior. Strongly encouraged patient to report to ED with any worsening or persistent symptoms. Patient expresses understanding.   Final Clinical Impressions(s) / UC Diagnoses   Final diagnoses:  COPD exacerbation (HCC)  Atrial fibrillation, unspecified type [facility])   Discharge Instructions   None    ED Prescriptions     Medication Sig Dispense Auth. Provider   doxycycline (VIBRAMYCIN) 100 MG capsule Take 1 capsule (100 mg total) by mouth 2 (two) times daily. 20 capsule Erma Pinto F, PA-C   ondansetron (ZOFRAN-ODT) 4 MG disintegrating tablet Take 1 tablet (4 mg total) by mouth every 8 (eight) hours as needed. 20 tablet Tomi Bamberger, PA-C      PDMP not reviewed this encounter.   Tomi Bamberger, PA-C 05/17/23 1048    Tomi Bamberger, PA-C 05/17/23 1049

## 2023-05-18 ENCOUNTER — Ambulatory Visit: Payer: PPO | Attending: Cardiology | Admitting: Cardiology

## 2023-05-18 ENCOUNTER — Encounter: Payer: Self-pay | Admitting: Cardiology

## 2023-05-18 VITALS — BP 120/70 | HR 67 | Ht 72.0 in | Wt 203.8 lb

## 2023-05-18 DIAGNOSIS — I10 Essential (primary) hypertension: Secondary | ICD-10-CM

## 2023-05-18 DIAGNOSIS — I4891 Unspecified atrial fibrillation: Secondary | ICD-10-CM | POA: Diagnosis not present

## 2023-05-18 DIAGNOSIS — R0600 Dyspnea, unspecified: Secondary | ICD-10-CM

## 2023-05-18 DIAGNOSIS — I5030 Unspecified diastolic (congestive) heart failure: Secondary | ICD-10-CM

## 2023-05-18 DIAGNOSIS — I2510 Atherosclerotic heart disease of native coronary artery without angina pectoris: Secondary | ICD-10-CM

## 2023-05-18 DIAGNOSIS — I2584 Coronary atherosclerosis due to calcified coronary lesion: Secondary | ICD-10-CM

## 2023-05-18 LAB — SARS CORONAVIRUS 2 (TAT 6-24 HRS): SARS Coronavirus 2: NEGATIVE

## 2023-05-18 NOTE — Patient Instructions (Addendum)
Medication Instructions:  Your physician recommends that you continue on your current medications as directed. Please refer to the Current Medication list given to you today.  *If you need a refill on your cardiac medications before your next appointment, please call your pharmacy*   Lab Work: Your physician recommends that you return for lab work in: Today for a CBC, BMP, Magnesium, ProBNP and Direct LDL  If you have labs (blood work) drawn today and your tests are completely normal, you will receive your results only by: MyChart Message (if you have MyChart) OR A paper copy in the mail If you have any lab test that is abnormal or we need to change your treatment, we will call you to review the results.   Testing/Procedures: NONE   Follow-Up: At [facility], you and your health needs are our priority.  As part of our continuing mission to provide you with exceptional heart care, we have created designated Provider Care Teams.  These Care Teams include your primary Cardiologist (physician) and Advanced Practice Providers (APPs -  Physician Assistants and Nurse Practitioners) who all work together to provide you with the care you need, when you need it.  We recommend signing up for the patient portal called "MyChart".  Sign up information is provided on this After Visit Summary.  MyChart is used to connect with patients for Virtual Visits (Telemedicine).  Patients are able to view lab/test results, encounter notes, upcoming appointments, etc.  Non-urgent messages can be sent to your provider as well.   To learn more about what you can do with MyChart, go to [email or web address].    Your next appointment:   3 month(s)  Provider:   Belva Crome, MD    Other Instructions

## 2023-05-19 ENCOUNTER — Telehealth: Payer: Self-pay

## 2023-05-19 ENCOUNTER — Telehealth: Payer: Self-pay | Admitting: Cardiology

## 2023-05-19 DIAGNOSIS — I509 Heart failure, unspecified: Secondary | ICD-10-CM

## 2023-05-19 LAB — BASIC METABOLIC PANEL WITH GFR
BUN/Creatinine Ratio: 19 (ref 10–24)
BUN: 23 mg/dL (ref 8–27)
CO2: 29 mmol/L (ref 20–29)
Calcium: 8.7 mg/dL (ref 8.6–10.2)
Chloride: 96 mmol/L (ref 96–106)
Creatinine, Ser: 1.19 mg/dL (ref 0.76–1.27)
Glucose: 88 mg/dL (ref 70–99)
Potassium: 4.5 mmol/L (ref 3.5–5.2)
Sodium: 136 mmol/L (ref 134–144)
eGFR: 65 mL/min/1.73

## 2023-05-19 LAB — CBC WITH DIFFERENTIAL/PLATELET
Basophils Absolute: 0.1 x10E3/uL (ref 0.0–0.2)
Basos: 1 %
EOS (ABSOLUTE): 0.1 x10E3/uL (ref 0.0–0.4)
Eos: 1 %
Hematocrit: 34.3 % — ABNORMAL LOW (ref 37.5–51.0)
Hemoglobin: 10.8 g/dL — ABNORMAL LOW (ref 13.0–17.7)
Immature Grans (Abs): 0.5 x10E3/uL — ABNORMAL HIGH (ref 0.0–0.1)
Immature Granulocytes: 5 %
Lymphocytes Absolute: 0.9 x10E3/uL (ref 0.7–3.1)
Lymphs: 9 %
MCH: 27.5 pg (ref 26.6–33.0)
MCHC: 31.5 g/dL (ref 31.5–35.7)
MCV: 87 fL (ref 79–97)
Monocytes Absolute: 1.1 x10E3/uL — ABNORMAL HIGH (ref 0.1–0.9)
Monocytes: 11 %
Neutrophils Absolute: 7.2 x10E3/uL — ABNORMAL HIGH (ref 1.4–7.0)
Neutrophils: 73 %
Platelets: 250 x10E3/uL (ref 150–450)
RBC: 3.93 x10E6/uL — ABNORMAL LOW (ref 4.14–5.80)
RDW: 13.6 % (ref 11.6–15.4)
WBC: 9.7 x10E3/uL (ref 3.4–10.8)

## 2023-05-19 LAB — PRO B NATRIURETIC PEPTIDE: NT-Pro BNP: 1876 pg/mL — ABNORMAL HIGH (ref 0–376)

## 2023-05-19 LAB — LDL CHOLESTEROL, DIRECT: LDL Direct: 99 mg/dL (ref 0–99)

## 2023-05-19 LAB — MAGNESIUM: Magnesium: 1.6 mg/dL (ref 1.6–2.3)

## 2023-05-19 MED ORDER — MAGNESIUM CHLORIDE 64 MG PO TABS: 64.0000 mg | ORAL_TABLET | Freq: Every day | ORAL | 3 refills | Status: AC

## 2023-05-19 NOTE — Telephone Encounter (Signed)
Results reviewed with pt as per Jennifer Woody PA's note.  Pt verbalized understanding and had no additional questions. Routed to PCP.  

## 2023-05-19 NOTE — Telephone Encounter (Signed)
-----   Message from Flossie Dibble sent at 05/19/2023  9:18 AM EDT ----- He is holding on to too much fluid.  Increase Lasix to 40 mg po x 3 days, after the three days, weigh daily and take extra lasix for weight gain of 3 lbs/day.  Restrict fluid intake < 64 ounces/day Return on Monday for repeat BMET and probnp.

## 2023-05-20 ENCOUNTER — Inpatient Hospital Stay: Payer: PPO | Attending: Internal Medicine

## 2023-05-20 DIAGNOSIS — Z95828 Presence of other vascular implants and grafts: Secondary | ICD-10-CM

## 2023-05-20 DIAGNOSIS — C3411 Malignant neoplasm of upper lobe, right bronchus or lung: Secondary | ICD-10-CM | POA: Insufficient documentation

## 2023-05-20 DIAGNOSIS — Z79899 Other long term (current) drug therapy: Secondary | ICD-10-CM | POA: Insufficient documentation

## 2023-05-20 MED ORDER — HEPARIN SOD (PORK) LOCK FLUSH 100 UNIT/ML IV SOLN
500.0000 [IU] | Freq: Once | INTRAVENOUS | Status: AC | PRN
  Administered 2023-05-20: 500 [IU] via INTRAVENOUS

## 2023-05-20 MED ORDER — SODIUM CHLORIDE 0.9% FLUSH
10.0000 mL | INTRAVENOUS | Status: DC | PRN
  Administered 2023-05-20: 10 mL via INTRAVENOUS

## 2023-05-26 ENCOUNTER — Inpatient Hospital Stay (HOSPITAL_COMMUNITY)
Admission: EM | Admit: 2023-05-26 | Discharge: 2023-05-28 | DRG: 193 | Disposition: A | Discharge status: Home / Self Care | Payer: PPO | Attending: Family Medicine | Admitting: Family Medicine

## 2023-05-26 ENCOUNTER — Other Ambulatory Visit: Payer: Self-pay

## 2023-05-26 ENCOUNTER — Emergency Department (HOSPITAL_COMMUNITY): Payer: PPO

## 2023-05-26 DIAGNOSIS — E7800 Pure hypercholesterolemia, unspecified: Secondary | ICD-10-CM | POA: Diagnosis present

## 2023-05-26 DIAGNOSIS — I110 Hypertensive heart disease with heart failure: Secondary | ICD-10-CM | POA: Diagnosis present

## 2023-05-26 DIAGNOSIS — E039 Hypothyroidism, unspecified: Secondary | ICD-10-CM | POA: Diagnosis present

## 2023-05-26 DIAGNOSIS — I5032 Chronic diastolic (congestive) heart failure: Secondary | ICD-10-CM | POA: Diagnosis present

## 2023-05-26 DIAGNOSIS — J440 Chronic obstructive pulmonary disease with acute lower respiratory infection: Secondary | ICD-10-CM | POA: Diagnosis present

## 2023-05-26 DIAGNOSIS — Z9981 Dependence on supplemental oxygen: Secondary | ICD-10-CM | POA: Diagnosis not present

## 2023-05-26 DIAGNOSIS — Z1152 Encounter for screening for COVID-19: Secondary | ICD-10-CM

## 2023-05-26 DIAGNOSIS — Z87891 Personal history of nicotine dependence: Secondary | ICD-10-CM

## 2023-05-26 DIAGNOSIS — J9621 Acute and chronic respiratory failure with hypoxia: Secondary | ICD-10-CM | POA: Diagnosis present

## 2023-05-26 DIAGNOSIS — F32A Depression, unspecified: Secondary | ICD-10-CM | POA: Diagnosis present

## 2023-05-26 DIAGNOSIS — Z85118 Personal history of other malignant neoplasm of bronchus and lung: Secondary | ICD-10-CM

## 2023-05-26 DIAGNOSIS — J189 Pneumonia, unspecified organism: Principal | ICD-10-CM | POA: Diagnosis present

## 2023-05-26 DIAGNOSIS — Z8249 Family history of ischemic heart disease and other diseases of the circulatory system: Secondary | ICD-10-CM | POA: Diagnosis not present

## 2023-05-26 DIAGNOSIS — Z23 Encounter for immunization: Secondary | ICD-10-CM

## 2023-05-26 DIAGNOSIS — N400 Enlarged prostate without lower urinary tract symptoms: Secondary | ICD-10-CM | POA: Diagnosis present

## 2023-05-26 DIAGNOSIS — Z79890 Hormone replacement therapy (postmenopausal): Secondary | ICD-10-CM | POA: Diagnosis not present

## 2023-05-26 DIAGNOSIS — I4891 Unspecified atrial fibrillation: Secondary | ICD-10-CM | POA: Diagnosis present

## 2023-05-26 DIAGNOSIS — Z888 Allergy status to other drugs, medicaments and biological substances status: Secondary | ICD-10-CM | POA: Diagnosis not present

## 2023-05-26 DIAGNOSIS — I2510 Atherosclerotic heart disease of native coronary artery without angina pectoris: Secondary | ICD-10-CM | POA: Diagnosis present

## 2023-05-26 DIAGNOSIS — F4320 Adjustment disorder, unspecified: Secondary | ICD-10-CM | POA: Diagnosis present

## 2023-05-26 DIAGNOSIS — Z79899 Other long term (current) drug therapy: Secondary | ICD-10-CM

## 2023-05-26 DIAGNOSIS — Z7901 Long term (current) use of anticoagulants: Secondary | ICD-10-CM | POA: Diagnosis not present

## 2023-05-26 DIAGNOSIS — Z8572 Personal history of non-Hodgkin lymphomas: Secondary | ICD-10-CM | POA: Diagnosis not present

## 2023-05-26 DIAGNOSIS — J441 Chronic obstructive pulmonary disease with (acute) exacerbation: Secondary | ICD-10-CM | POA: Diagnosis present

## 2023-05-26 DIAGNOSIS — I272 Pulmonary hypertension, unspecified: Secondary | ICD-10-CM | POA: Diagnosis present

## 2023-05-26 DIAGNOSIS — K219 Gastro-esophageal reflux disease without esophagitis: Secondary | ICD-10-CM | POA: Diagnosis present

## 2023-05-26 DIAGNOSIS — L409 Psoriasis, unspecified: Secondary | ICD-10-CM | POA: Diagnosis present

## 2023-05-26 HISTORY — DX: Chronic obstructive pulmonary disease with (acute) exacerbation: J44.1

## 2023-05-26 LAB — I-STAT VENOUS BLOOD GAS, ED
Acid-Base Excess: 4 mmol/L — ABNORMAL HIGH (ref 0.0–2.0)
Bicarbonate: 31.4 mmol/L — ABNORMAL HIGH (ref 20.0–28.0)
Calcium, Ion: 1.2 mmol/L (ref 1.15–1.40)
HCT: 31 % — ABNORMAL LOW (ref 39.0–52.0)
Hemoglobin: 10.5 g/dL — ABNORMAL LOW (ref 13.0–17.0)
O2 Saturation: 92 %
Potassium: 5.1 mmol/L (ref 3.5–5.1)
Sodium: 137 mmol/L (ref 135–145)
TCO2: 33 mmol/L — ABNORMAL HIGH (ref 22–32)
pCO2, Ven: 59.6 mmHg (ref 44–60)
pH, Ven: 7.331 (ref 7.25–7.43)
pO2, Ven: 70 mmHg — ABNORMAL HIGH (ref 32–45)

## 2023-05-26 LAB — COMPREHENSIVE METABOLIC PANEL
ALT: 15 U/L (ref 0–44)
AST: 16 U/L (ref 15–41)
Albumin: 3.1 g/dL — ABNORMAL LOW (ref 3.5–5.0)
Alkaline Phosphatase: 71 U/L (ref 38–126)
Anion gap: 10 (ref 5–15)
BUN: 38 mg/dL — ABNORMAL HIGH (ref 8–23)
CO2: 29 mmol/L (ref 22–32)
Calcium: 9.2 mg/dL (ref 8.9–10.3)
Chloride: 99 mmol/L (ref 98–111)
Creatinine, Ser: 1.81 mg/dL — ABNORMAL HIGH (ref 0.61–1.24)
GFR, Estimated: 39 mL/min — ABNORMAL LOW (ref >60)
Glucose, Bld: 136 mg/dL — ABNORMAL HIGH (ref 70–99)
Potassium: 5.3 mmol/L — ABNORMAL HIGH (ref 3.5–5.1)
Sodium: 138 mmol/L (ref 135–145)
Total Bilirubin: 0.2 mg/dL — ABNORMAL LOW (ref 0.3–1.2)
Total Protein: 6.2 g/dL — ABNORMAL LOW (ref 6.5–8.1)

## 2023-05-26 LAB — CBC WITH DIFFERENTIAL/PLATELET
Abs Immature Granulocytes: 0.1 10*3/uL — ABNORMAL HIGH (ref 0.00–0.07)
Basophils Absolute: 0 10*3/uL (ref 0.0–0.1)
Basophils Relative: 0 %
Eosinophils Absolute: 0.2 10*3/uL (ref 0.0–0.5)
Eosinophils Relative: 2 %
HCT: 32.2 % — ABNORMAL LOW (ref 39.0–52.0)
Hemoglobin: 9.9 g/dL — ABNORMAL LOW (ref 13.0–17.0)
Immature Granulocytes: 1 %
Lymphocytes Relative: 20 %
Lymphs Abs: 1.7 10*3/uL (ref 0.7–4.0)
MCH: 28 pg (ref 26.0–34.0)
MCHC: 30.7 g/dL (ref 30.0–36.0)
MCV: 91.2 fL (ref 80.0–100.0)
Monocytes Absolute: 0.8 10*3/uL (ref 0.1–1.0)
Monocytes Relative: 9 %
Neutro Abs: 5.9 10*3/uL (ref 1.7–7.7)
Neutrophils Relative %: 68 %
Platelets: 192 10*3/uL (ref 150–400)
RBC: 3.53 MIL/uL — ABNORMAL LOW (ref 4.22–5.81)
RDW: 14.3 % (ref 11.5–15.5)
WBC: 8.7 10*3/uL (ref 4.0–10.5)
nRBC: 0 % (ref 0.0–0.2)

## 2023-05-26 LAB — BASIC METABOLIC PANEL WITH GFR
BUN/Creatinine Ratio: 20 (ref 10–24)
BUN: 29 mg/dL — ABNORMAL HIGH (ref 8–27)
CO2: 28 mmol/L (ref 20–29)
Calcium: 9.5 mg/dL (ref 8.6–10.2)
Chloride: 100 mmol/L (ref 96–106)
Creatinine, Ser: 1.47 mg/dL — ABNORMAL HIGH (ref 0.76–1.27)
Glucose: 97 mg/dL (ref 70–99)
Potassium: 5 mmol/L (ref 3.5–5.2)
Sodium: 139 mmol/L (ref 134–144)
eGFR: 50 mL/min/1.73 — ABNORMAL LOW

## 2023-05-26 LAB — PRO B NATRIURETIC PEPTIDE: NT-Pro BNP: 2365 pg/mL — ABNORMAL HIGH (ref 0–376)

## 2023-05-26 LAB — LACTIC ACID, PLASMA: Lactic Acid, Venous: 0.7 mmol/L (ref 0.5–1.9)

## 2023-05-26 LAB — MAGNESIUM: Magnesium: 1.7 mg/dL (ref 1.7–2.4)

## 2023-05-26 LAB — BRAIN NATRIURETIC PEPTIDE: B Natriuretic Peptide: 319.7 pg/mL — ABNORMAL HIGH (ref 0.0–100.0)

## 2023-05-26 LAB — TROPONIN I (HIGH SENSITIVITY): Troponin I (High Sensitivity): 7 ng/L (ref <18)

## 2023-05-26 LAB — SARS CORONAVIRUS 2 BY RT PCR: SARS Coronavirus 2 by RT PCR: NEGATIVE

## 2023-05-26 MED ORDER — PREDNISONE 20 MG PO TABS
40.0000 mg | ORAL_TABLET | Freq: Every day | ORAL | Status: DC
  Administered 2023-05-27 – 2023-05-28 (×2): 40 mg via ORAL
  Filled 2023-05-26 (×2): qty 2

## 2023-05-26 MED ORDER — ALBUTEROL SULFATE (2.5 MG/3ML) 0.083% IN NEBU
15.0000 mg | INHALATION_SOLUTION | Freq: Once | RESPIRATORY_TRACT | Status: AC
  Administered 2023-05-26: 15 mg via RESPIRATORY_TRACT
  Filled 2023-05-26: qty 18

## 2023-05-26 MED ORDER — METHYLPREDNISOLONE SODIUM SUCC 125 MG IJ SOLR: 125.0000 mg | Freq: Once | INTRAMUSCULAR | Status: DC

## 2023-05-26 MED ORDER — LISINOPRIL 10 MG PO TABS
10.0000 mg | ORAL_TABLET | Freq: Every day | ORAL | Status: DC
  Administered 2023-05-27 – 2023-05-28 (×2): 10 mg via ORAL
  Filled 2023-05-26 (×2): qty 1

## 2023-05-26 MED ORDER — MELATONIN 3 MG PO TABS: 3.0000 mg | ORAL_TABLET | Freq: Every evening | ORAL | Status: DC | PRN

## 2023-05-26 MED ORDER — ALBUTEROL SULFATE (2.5 MG/3ML) 0.083% IN NEBU: 2.5000 mg | INHALATION_SOLUTION | RESPIRATORY_TRACT | Status: DC | PRN

## 2023-05-26 MED ORDER — MAGNESIUM SULFATE 2 GM/50ML IV SOLN
2.0000 g | Freq: Once | INTRAVENOUS | Status: AC
  Administered 2023-05-26: 2 g via INTRAVENOUS
  Filled 2023-05-26: qty 50

## 2023-05-26 MED ORDER — SODIUM CHLORIDE 0.9 % IV SOLN
500.0000 mg | Freq: Once | INTRAVENOUS | Status: AC
  Administered 2023-05-26: 500 mg via INTRAVENOUS
  Filled 2023-05-26: qty 5

## 2023-05-26 MED ORDER — IPRATROPIUM-ALBUTEROL 0.5-2.5 (3) MG/3ML IN SOLN
3.0000 mL | Freq: Four times a day (QID) | RESPIRATORY_TRACT | Status: DC
  Administered 2023-05-27 – 2023-05-28 (×5): 3 mL via RESPIRATORY_TRACT
  Filled 2023-05-26 (×5): qty 3

## 2023-05-26 MED ORDER — APIXABAN 5 MG PO TABS
5.0000 mg | ORAL_TABLET | Freq: Two times a day (BID) | ORAL | Status: DC
  Administered 2023-05-27 – 2023-05-28 (×3): 5 mg via ORAL
  Filled 2023-05-26 (×3): qty 1

## 2023-05-26 MED ORDER — IPRATROPIUM BROMIDE 0.02 % IN SOLN
1.0000 mg | Freq: Once | RESPIRATORY_TRACT | Status: AC
  Administered 2023-05-26: 1 mg via RESPIRATORY_TRACT
  Filled 2023-05-26: qty 5

## 2023-05-26 MED ORDER — PANTOPRAZOLE SODIUM 40 MG PO TBEC
40.0000 mg | DELAYED_RELEASE_TABLET | Freq: Every day | ORAL | Status: DC
  Administered 2023-05-27 – 2023-05-28 (×2): 40 mg via ORAL
  Filled 2023-05-26 (×2): qty 1

## 2023-05-26 MED ORDER — ONDANSETRON HCL 4 MG PO TABS: 4.0000 mg | ORAL_TABLET | Freq: Four times a day (QID) | ORAL | Status: DC | PRN

## 2023-05-26 MED ORDER — SODIUM CHLORIDE 0.9 % IV SOLN
2.0000 g | Freq: Two times a day (BID) | INTRAVENOUS | Status: DC
  Administered 2023-05-27: 2 g via INTRAVENOUS
  Filled 2023-05-26: qty 12.5

## 2023-05-26 MED ORDER — ACETAMINOPHEN 650 MG RE SUPP: 650.0000 mg | Freq: Four times a day (QID) | RECTAL | Status: DC | PRN

## 2023-05-26 MED ORDER — DOXYCYCLINE HYCLATE 100 MG PO CAPS: 100.0000 mg | ORAL_CAPSULE | Freq: Two times a day (BID) | ORAL | Status: DC

## 2023-05-26 MED ORDER — ACETAMINOPHEN 500 MG PO TABS: 500.0000 mg | ORAL_TABLET | Freq: Four times a day (QID) | ORAL | Status: DC | PRN

## 2023-05-26 MED ORDER — AMLODIPINE BESYLATE 5 MG PO TABS
5.0000 mg | ORAL_TABLET | Freq: Every day | ORAL | Status: DC
  Administered 2023-05-27 – 2023-05-28 (×2): 5 mg via ORAL
  Filled 2023-05-26 (×2): qty 1

## 2023-05-26 MED ORDER — SODIUM CHLORIDE 0.9 % IV SOLN
1.0000 g | Freq: Once | INTRAVENOUS | Status: AC
  Administered 2023-05-26: 1 g via INTRAVENOUS
  Filled 2023-05-26: qty 10

## 2023-05-26 MED ORDER — SORBITOL 70 % SOLN
30.0000 mL | Freq: Every day | Status: DC | PRN
  Administered 2023-05-27: 30 mL via ORAL
  Filled 2023-05-26 (×2): qty 30

## 2023-05-26 MED ORDER — LINACLOTIDE 145 MCG PO CAPS
290.0000 ug | ORAL_CAPSULE | Freq: Every day | ORAL | Status: DC
  Administered 2023-05-27 – 2023-05-28 (×2): 290 ug via ORAL
  Filled 2023-05-26 (×2): qty 2

## 2023-05-26 MED ORDER — MAGNESIUM CHLORIDE 64 MG PO TBEC
64.0000 mg | DELAYED_RELEASE_TABLET | Freq: Every day | ORAL | Status: DC
  Administered 2023-05-27 – 2023-05-28 (×2): 64 mg via ORAL
  Filled 2023-05-26 (×2): qty 1

## 2023-05-26 MED ORDER — ALLOPURINOL 300 MG PO TABS
300.0000 mg | ORAL_TABLET | Freq: Every day | ORAL | Status: DC
  Administered 2023-05-27 – 2023-05-28 (×2): 300 mg via ORAL
  Filled 2023-05-26 (×2): qty 1

## 2023-05-26 MED ORDER — LEVOTHYROXINE SODIUM 75 MCG PO TABS
175.0000 ug | ORAL_TABLET | Freq: Every day | ORAL | Status: DC
  Administered 2023-05-27 – 2023-05-28 (×2): 175 ug via ORAL
  Filled 2023-05-26 (×2): qty 1

## 2023-05-26 MED ORDER — METOPROLOL SUCCINATE ER 25 MG PO TB24
25.0000 mg | ORAL_TABLET | Freq: Every day | ORAL | Status: DC
  Administered 2023-05-27 – 2023-05-28 (×2): 25 mg via ORAL
  Filled 2023-05-26 (×2): qty 1

## 2023-05-26 MED ORDER — EZETIMIBE 10 MG PO TABS
10.0000 mg | ORAL_TABLET | Freq: Every day | ORAL | Status: DC
  Administered 2023-05-27 – 2023-05-28 (×2): 10 mg via ORAL
  Filled 2023-05-26 (×2): qty 1

## 2023-05-26 MED ORDER — ONDANSETRON HCL 4 MG/2ML IJ SOLN: 4.0000 mg | Freq: Four times a day (QID) | INTRAMUSCULAR | Status: DC | PRN

## 2023-05-26 MED ORDER — FINASTERIDE 5 MG PO TABS
5.0000 mg | ORAL_TABLET | Freq: Every day | ORAL | Status: DC
  Administered 2023-05-27 – 2023-05-28 (×2): 5 mg via ORAL
  Filled 2023-05-26 (×2): qty 1

## 2023-05-26 MED ORDER — ROSUVASTATIN CALCIUM 5 MG PO TABS
10.0000 mg | ORAL_TABLET | Freq: Every day | ORAL | Status: DC
  Administered 2023-05-28: 10 mg via ORAL
  Filled 2023-05-26 (×2): qty 2

## 2023-05-26 NOTE — ED Provider Notes (Signed)
[name] Provider Note   CSN: [id number] Arrival date & time: 05/26/23  2109     History  Chief Complaint  Patient presents with   Shortness of Breath    [name] is a 72 y.o. male history of of A-fib on Eliquis, COPD, non-Hodgkin's lymphoma here presenting with shortness of breath.  Patient states that he is on 2 L nasal cannula as needed.  He states that he has been short of breath today.  He noticed his oxygen dropped to 70%.  He was able to increase his oxygen up to 4 L nasal cannula.  He called EMS and patient was brought here for further evaluation.  Patient was given 125 mg of Solu-Medrol and also 7.5 mg of albuterol and 0.5 mg Atrovent.  The history is provided by the patient.       Home Medications Prior to Admission medications   Medication Sig Start Date End Date Taking? Authorizing Provider  allopurinol (ZYLOPRIM) 300 MG tablet Take 300 mg by mouth daily. 01/15/17   [provider]  amLODipine (NORVASC) 5 MG tablet Take 5 mg by mouth daily. 01/21/20   [provider]  apixaban (ELIQUIS) 5 MG TABS tablet Take 1 tablet (5 mg total) by mouth 2 (two) times daily. 02/02/23 01/28/24  Revankar, Aundra Dubin, MD  betamethasone dipropionate 0.05 % lotion Apply 1 application  topically 2 (two) times daily. To legs for psoriasis    [provider]  Buprenorphine HCl-Naloxone HCl 8-2 MG FILM Place 0.5-1 Film under the tongue 2 (two) times daily as needed (back pain). 03/24/23   [provider]  doxycycline (VIBRAMYCIN) 100 MG capsule Take 1 capsule (100 mg total) by mouth 2 (two) times daily. 05/17/23   Tomi Bamberger, PA-C  ezetimibe (ZETIA) 10 MG tablet Take 10 mg by mouth daily. 02/09/23   [provider]  finasteride (PROSCAR) 5 MG tablet Take 5 mg by mouth daily. 03/13/19   [provider]  furosemide (LASIX) 20 MG tablet Take 1 tablet (20 mg total) by mouth daily. 04/12/23 04/11/24   Marykay Lex, MD  ipratropium-albuterol (DUONEB) 0.5-2.5 (3) MG/3ML SOLN Take 3 mLs by nebulization every 6 (six) hours as needed. 05/10/23   Meredeth Ide, MD  levothyroxine (SYNTHROID) 175 MCG tablet Take 175 mcg by mouth daily. 10/29/22   [provider]  LINZESS 290 MCG CAPS capsule Take 290 mcg by mouth daily. 01/19/23   [provider]  lisinopril (PRINIVIL,ZESTRIL) 10 MG tablet Take 1 tablet (10 mg total) by mouth daily. 12/29/11   Nahser, Deloris Ping, MD  Magnesium Chloride 64 MG TABS Take 64 mg by mouth daily in the afternoon. 05/19/23   Flossie Dibble, NP  metoprolol succinate (TOPROL XL) 25 MG 24 hr tablet Take 1 tablet (25 mg total) by mouth daily. 02/08/23   Revankar, Aundra Dubin, MD  omeprazole (PRILOSEC) 20 MG capsule Take 1 capsule (20 mg total) by mouth 2 (two) times daily. 10/13/19   Leslye Peer, MD  ondansetron (ZOFRAN-ODT) 4 MG disintegrating tablet Take 1 tablet (4 mg total) by mouth every 8 (eight) hours as needed. 05/17/23   Tomi Bamberger, PA-C  predniSONE (DELTASONE) 10 MG tablet Prednisone 40 mg po daily x 1 day then Prednisone 30 mg po daily x 1 day then Prednisone 20 mg po daily x 1 day then Prednisone 10 mg daily x 1 day then stop... 05/10/23   Mauro Kaufmann  S, MD  PROAIR HFA 108 (90 Base) MCG/ACT inhaler Inhale 2 puffs into the lungs every 6 (six) hours as needed for wheezing or shortness of breath. 10/02/17   Albertine Grates, MD  rosuvastatin (CRESTOR) 10 MG tablet Take 1 tablet (10 mg total) by mouth daily. 04/13/23 07/12/23  Revankar, Aundra Dubin, MD  TRELEGY ELLIPTA 100-62.5-25 MCG/INH AEPB Inhale 1 puff into the lungs daily. 05/08/19   [provider]  zolpidem (AMBIEN) 10 MG tablet Take 10 mg by mouth at bedtime as needed for sleep. 03/24/23   [provider]      Allergies    Lipitor [atorvastatin] and Omnipaque [iohexol]    Review of Systems   Review of Systems  Respiratory:  Positive for shortness of breath.   All other systems reviewed and  are negative.   Physical Exam Updated Vital Signs BP 123/66   Pulse 90   Temp 98.5 F (36.9 C) (Oral)   Resp 15   SpO2 95%  Physical Exam Vitals and nursing note reviewed.  Constitutional:      Comments: Slightly tachypneic and moderate distress  HENT:     Head: Normocephalic.     Mouth/Throat:     Mouth: Mucous membranes are moist.  Eyes:     Extraocular Movements: Extraocular movements intact.     Pupils: Pupils are equal, round, and reactive to light.  Cardiovascular:     Rate and Rhythm: Normal rate and regular rhythm.  Pulmonary:     Comments: Diminished bilaterally and poor air movement.  No obvious wheezing Abdominal:     General: Bowel sounds are normal.     Palpations: Abdomen is soft.  Musculoskeletal:     Cervical back: Normal range of motion and neck supple.     Comments: Trace edema bilaterally  Skin:    Capillary Refill: Capillary refill takes less than 2 seconds.  Neurological:     General: No focal deficit present.     Mental Status: He is oriented to person, place, and time.  Psychiatric:        Mood and Affect: Mood normal.        Behavior: Behavior normal.     ED Results / Procedures / Treatments   Labs (all labs ordered are listed, but only abnormal results are displayed) Labs Reviewed  CBC WITH DIFFERENTIAL/PLATELET - Abnormal; Notable for the following components:      Result Value   RBC 3.53 (*)    Hemoglobin 9.9 (*)    HCT 32.2 (*)    Abs Immature Granulocytes 0.10 (*)    All other components within normal limits  I-STAT VENOUS BLOOD GAS, ED - Abnormal; Notable for the following components:   pO2, Ven 70 (*)    Bicarbonate 31.4 (*)    TCO2 33 (*)    Acid-Base Excess 4.0 (*)    HCT 31.0 (*)    Hemoglobin 10.5 (*)    All other components within normal limits  SARS CORONAVIRUS 2 BY RT PCR  COMPREHENSIVE METABOLIC PANEL  BRAIN NATRIURETIC PEPTIDE  MAGNESIUM  TROPONIN I (HIGH SENSITIVITY)    EKG None  Radiology No results  found.  Procedures Procedures    CRITICAL CARE Performed by: Richardean Canal   Total critical care time: 30 minutes  Critical care time was exclusive of separately billable procedures and treating other patients.  Critical care was necessary to treat or prevent imminent or life-threatening deterioration.  Critical care was time spent personally by me on  the following activities: development of treatment plan with patient and/or surrogate as well as nursing, discussions with consultants, evaluation of patient's response to treatment, examination of patient, obtaining history from patient or surrogate, ordering and performing treatments and interventions, ordering and review of laboratory studies, ordering and review of radiographic studies, pulse oximetry and re-evaluation of patient's condition.   Medications Ordered in ED Medications  magnesium sulfate IVPB 2 g 50 mL (has no administration in time range)  albuterol (PROVENTIL,VENTOLIN) solution continuous neb (has no administration in time range)  ipratropium (ATROVENT) nebulizer solution 1 mg (has no administration in time range)    ED Course/ Medical Decision Making/ A&P                                 Medical Decision Making Santy Deyette Finner is a 72 y.o. male here with shortness of breath and hypoxia.  I think likely COPD versus CHF exacerbation.  Patient has history of COPD.  Patient was given Solu-Medrol prior to arrival.  Will try continuous neb and magnesium.  Will also get CBC and CMP and BNP and chest x-ray and VBG.  10:30 PM I reviewed patient's labs and white blood cell count is normal.  Creatinine is 1.8.  Chest x-ray showed possible pneumonia.  At the point patient will be admitted for worsening hypoxia from COPD, pneumonia  Problems Addressed: Chronic obstructive pulmonary disease with acute exacerbation (HCC): acute illness or injury Community acquired pneumonia of left lower lobe of lung: acute illness or  injury  Amount and/or Complexity of Data Reviewed Labs: ordered. Decision-making details documented in ED Course. Radiology: ordered and independent interpretation performed. Decision-making details documented in ED Course. ECG/medicine tests: ordered and independent interpretation performed. Decision-making details documented in ED Course.  Risk Prescription drug management. Decision regarding hospitalization.    Final Clinical Impression(s) / ED Diagnoses Final diagnoses:  None    Rx / DC Orders ED Discharge Orders     None         Charlynne Pander, MD 05/26/23 2252

## 2023-05-26 NOTE — ED Triage Notes (Signed)
Pt to ED via [facility] from home. Pt lives at home with wife. Pt called out for SOB. Pt reports his O2 was dropping down to the 70% on RA today. Pt has PRN O2 at home. Pt reports wife put him on 4L [place] and SPO2 came up to 92%. Pt has dx pna x3 weeks and is on antibiotics. EMS gave duoneb en route with improvement. Pt reports productive cough. Pt denies CP.   GCS 15  EMS Vitals: 125 solu medrol 7.5 albueterol 0.5 atrovent  18 RFA 85 HR 122/62 96% on duo neb

## 2023-05-26 NOTE — H&P (Incomplete)
History and Physical    Patient: [name] YQM:[id number] DOB: March 04, 1951 DOA: 05/26/2023 DOS: the patient was seen and examined on 05/26/2023 PCP: [name], PA  Patient coming from: Home  Chief Complaint:  Chief Complaint  Patient presents with   Shortness of Breath   HPI: [name] is a 72 y.o. male with medical history significant for COPD, on 2 L O2 nasal cannula as needed at home, diastolic CHF, lung cancer in remission, non-Hodgkin's lymphoma in remission, and clean cardiac cath 3 weeks ago who called 911 himself because he could not breathe.  The patient reports he was in his usual state of health yesterday feeling fine.  Today his wheezing was noted by his wife early this morning and it progressively got worse.  He denies any fevers or shaking chills.  Just wheezing and shortness of breath.  He has not had any trouble with increased lower extremity edema in the last couple of days.  The patient was hospitalized less than a month ago for pneumonia. In the emergency department the patient was found to be hypoxic on 2 L O2 nasal cannula.  His oxygen was turned up to 6 L.  He was also wheezing so he was treated for COPD exacerbation and we are asked to admit the patient.  His chest x-ray is concerning for pneumonia though the patient has no fever and normal white blood cell count he will be treated with IV antibiotics.  His COVID test was negative.    Review of Systems: As mentioned in the history of present illness. All other systems reviewed and are negative. Past Medical History:  Diagnosis Date   Adjustment disorder with other symptom 11/27/2015   AKI (acute kidney injury) (HCC) 01/14/2016   Aortic atherosclerosis (HCC) 01/26/2023   Arthritis    Atrial fibrillation (HCC) 10/26/2011   BPH (benign prostatic hyperplasia) 2011   BRONCHOPNEUMONIA ORGANISM UNSPECIFIED 06/12/2010         Replacing diagnoses that were inactivated after the 12/21/22 regulatory import    Carpal tunnel syndrome, right upper limb 11/03/2022   Chronic respiratory failure with hypoxia (HCC) 08/27/2015   Congestive heart failure (HCC)    COPD (chronic obstructive pulmonary disease) (HCC)    COPD, severe (HCC) 11/27/2015   12/18/2015-pulmonary function test- FVC 2.75 (55% predicted), postbronchodilator ratio 56, postbronchodilator FEV1 1.66 (45% predicted), no significant bronchodilator response, mid flow reversibility, DLCO 33  >>>Severe obstructive airways disease, Severe diffusion defect      Coronary artery calcification 01/26/2023   Depression    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 [facility])    Drug-induced neutropenia (HCC) 10/08/2015   Encounter for antineoplastic chemotherapy 10/08/2015   Essential hypertension 06/05/2008   Qualifier: Diagnosis of   By: Truman Hayward Duncan Dull), [name]       Gastrointestinal obstruction (HCC)    Ileus   GERD (gastroesophageal reflux disease)    Gout attack 08/29/2012   Hx of colonoscopy    Hypothyroidism 03/30/2012   Lesion of ulnar nerve, right upper limb 11/03/2022   Lung cancer (HCC) 2011   Lymphadenopathy syndrome 09/12/2015   Mediastinal adenopathy 09/12/2015   NHL (non-Hodgkin's lymphoma) (HCC) 09/25/2015   Numbness of right hand 06/28/2022   Obesity    Open wound of right hand without foreign body 07/27/2021   Other dysphagia 11/11/2010   Qualifier: Diagnosis of   By: Marchelle Gearing MD, [name]       Polycythemia 01/26/2023   POLYCYTHEMIA 06/05/2008   Qualifier:  Diagnosis of   By: Truman Hayward Duncan Dull), [facility] catheter in place 08/20/2016   Post-nasal drip 10/04/2011   Primary cancer of right upper lobe of lung (HCC) 10/25/2019   Squamous cell   Pulmonary hypertension (HCC)    Pulmonary nodule, right 10/03/2019   Pure hypercholesterolemia 12/03/2011   Recurrent pneumonia 02/10/2012   Routine general medical examination at a health care facility 12/03/2011   Sepsis (HCC) 01/14/2016   Severe sepsis  (HCC) 01/14/2016   Squamous cell carcinoma lung (HCC) 09/26/2010   S/p LUL resection Dr [name] dec 2011      Status post bronchoscopy with biopsy    Stiffness of finger joint of right hand 09/24/2021   Streptococcal pneumonia (HCC) 04/2008   Testicular swelling 09/12/2015   Past Surgical History:  Procedure Laterality Date   APPENDECTOMY     BACK SURGERY     CARDIOVERSION  11/27/2011   Procedure: CARDIOVERSION;  Surgeon: Vesta Mixer, MD;  Location[facility] ENDOSCOPY;  Service: Cardiovascular;  Laterality: N/A;   COLONOSCOPY     FUDUCIAL PLACEMENT Right 10/13/2019   Procedure: Placement Of Fuducial;  Surgeon: Leslye Peer, MD;  Location: [facility] OR;  Service: Thoracic;  Laterality: Right;  Right Upper Lobe    LEFT HEART CATH AND CORONARY ANGIOGRAPHY N/A 04/12/2023   Procedure: LEFT HEART CATH AND CORONARY ANGIOGRAPHY;  Surgeon: Marykay Lex, MD;  Location: [facility] INVASIVE CV LAB;  Service: Cardiovascular;  Laterality: N/A;   LUNG CANCER SURGERY  2011   UPPER GI ENDOSCOPY     VIDEO BRONCHOSCOPY WITH ENDOBRONCHIAL NAVIGATION N/A 10/13/2019   Procedure: VIDEO BRONCHOSCOPY WITH ENDOBRONCHIAL NAVIGATION;  Surgeon: Leslye Peer, MD;  Location: [facility] OR;  Service: Thoracic;  Laterality: N/A;   VIDEO BRONCHOSCOPY WITH ENDOBRONCHIAL ULTRASOUND N/A 10/13/2019   Procedure: VIDEO BRONCHOSCOPY WITH ENDOBRONCHIAL ULTRASOUND;  Surgeon: Leslye Peer, MD;  Location: [facility] OR;  Service: Thoracic;  Laterality: N/A;   Social History:  reports that he quit smoking about 21 years ago. His smoking use included cigarettes. He started smoking about 64 years ago. He has a 129 pack-year smoking history. He has never used smokeless tobacco. He reports that he does not drink alcohol and does not use drugs.  Allergies  Allergen Reactions   Lipitor [Atorvastatin] Other (See Comments)    Myalgias  Weakness  Fatigue    Omnipaque [Iohexol] Itching and Other (See Comments)    When asked about contrast media today, pt stated  that last time he had the contrast that he was red all over and itchy from head to toe.     Family History  Problem Relation Age of Onset   Heart disease Father    Cancer Neg Hx    Diabetes Neg Hx    Stroke Neg Hx    Hypertension Neg Hx    Kidney disease Neg Hx     Prior to Admission medications   Medication Sig Start Date End Date Taking? Authorizing Provider  allopurinol (ZYLOPRIM) 300 MG tablet Take 300 mg by mouth daily. 01/15/17   [provider]  amLODipine (NORVASC) 5 MG tablet Take 5 mg by mouth daily. 01/21/20   [provider]  apixaban (ELIQUIS) 5 MG TABS tablet Take 1 tablet (5 mg total) by mouth 2 (two) times daily. 02/02/23 01/28/24  Revankar, Aundra Dubin, MD  betamethasone dipropionate 0.05 % lotion Apply 1 application  topically 2 (two) times daily. To legs for psoriasis    [provider]  Buprenorphine HCl-Naloxone HCl 8-2 MG FILM Place 0.5-1 Film under the tongue 2 (two) times daily as needed (back pain). 03/24/23   [provider]  doxycycline (VIBRAMYCIN) 100 MG capsule Take 1 capsule (100 mg total) by mouth 2 (two) times daily. 05/17/23   Tomi Bamberger, PA-C  ezetimibe (ZETIA) 10 MG tablet Take 10 mg by mouth daily. 02/09/23   [provider]  finasteride (PROSCAR) 5 MG tablet Take 5 mg by mouth daily. 03/13/19   [provider]  furosemide (LASIX) 20 MG tablet Take 1 tablet (20 mg total) by mouth daily. 04/12/23 04/11/24  Marykay Lex, MD  ipratropium-albuterol (DUONEB) 0.5-2.5 (3) MG/3ML SOLN Take 3 mLs by nebulization every 6 (six) hours as needed. 05/10/23   Meredeth Ide, MD  levothyroxine (SYNTHROID) 175 MCG tablet Take 175 mcg by mouth daily. 10/29/22   [provider]  LINZESS 290 MCG CAPS capsule Take 290 mcg by mouth daily. 01/19/23   [provider]  lisinopril (PRINIVIL,ZESTRIL) 10 MG tablet Take 1 tablet (10 mg total) by mouth daily. 12/29/11   Nahser, Deloris Ping, MD  Magnesium Chloride 64 MG TABS  Take 64 mg by mouth daily in the afternoon. 05/19/23   Flossie Dibble, NP  metoprolol succinate (TOPROL XL) 25 MG 24 hr tablet Take 1 tablet (25 mg total) by mouth daily. 02/08/23   Revankar, Aundra Dubin, MD  omeprazole (PRILOSEC) 20 MG capsule Take 1 capsule (20 mg total) by mouth 2 (two) times daily. 10/13/19   Leslye Peer, MD  ondansetron (ZOFRAN-ODT) 4 MG disintegrating tablet Take 1 tablet (4 mg total) by mouth every 8 (eight) hours as needed. 05/17/23   Tomi Bamberger, PA-C  predniSONE (DELTASONE) 10 MG tablet Prednisone 40 mg po daily x 1 day then Prednisone 30 mg po daily x 1 day then Prednisone 20 mg po daily x 1 day then Prednisone 10 mg daily x 1 day then stop... 05/10/23   Meredeth Ide, MD  PROAIR HFA 108 [id number] Base) MCG/ACT inhaler Inhale 2 puffs into the lungs every 6 (six) hours as needed for wheezing or shortness of breath. 10/02/17   Albertine Grates, MD  rosuvastatin (CRESTOR) 10 MG tablet Take 1 tablet (10 mg total) by mouth daily. 04/13/23 07/12/23  Revankar, Aundra Dubin, MD  TRELEGY ELLIPTA 100-62.5-25 MCG/INH AEPB Inhale 1 puff into the lungs daily. 05/08/19   [provider]  zolpidem (AMBIEN) 10 MG tablet Take 10 mg by mouth at bedtime as needed for sleep. 03/24/23   [provider]    Physical Exam: Vitals:   05/26/23 2200 05/26/23 2204 05/26/23 2215 05/26/23 2230  BP: 132/68  123/75 127/82  Pulse: 97  96 (!) 111  Resp:   17 19  Temp:      TempSrc:      SpO2: 94% 96% 100% 99%   Physical Exam:  General: No acute distress, well developed, well nourished HEENT: Normocephalic, atraumatic, PERRL Cardiovascular: Normal rate and rhythm. Distal pulses intact. Pulmonary: Normal pulmonary effort, normal breath sounds Gastrointestinal: Nondistended abdomen, soft, non-tender, normoactive bowel sounds, no organomegaly Musculoskeletal:Normal ROM, no lower ext edema Lymphadenopathy: No cervical LAD. Skin: Skin is warm and dry. Neuro: No focal deficits noted, AAOx3. PSYCH:  Attentive and cooperative  Data Reviewed: {Tip this will not be part of the note when signed- Document your independent interpretation of telemetry tracing, EKG, lab, Radiology test or any other diagnostic tests. Add any new diagnostic test ordered  today. (Optional):[id number]} Results for orders placed or performed during the hospital encounter of 05/26/23 (from the past 24 hour(s))  CBC with Differential     Status: Abnormal   Collection Time: 05/26/23  9:21 PM  Result Value Ref Range   WBC 8.7 4.0 - 10.5 K/uL   RBC 3.53 (L) 4.22 - 5.81 MIL/uL   Hemoglobin 9.9 (L) 13.0 - 17.0 g/dL   HCT 40.9 (L) 81.1 - 91.4 %   MCV 91.2 80.0 - 100.0 fL   MCH 28.0 26.0 - 34.0 pg   MCHC 30.7 30.0 - 36.0 g/dL   RDW 78.2 95.6 - 21.3 %   Platelets 192 150 - 400 K/uL   nRBC 0.0 0.0 - 0.2 %   Neutrophils Relative % 68 %   Neutro Abs 5.9 1.7 - 7.7 K/uL   Lymphocytes Relative 20 %   Lymphs Abs 1.7 0.7 - 4.0 K/uL   Monocytes Relative 9 %   Monocytes Absolute 0.8 0.1 - 1.0 K/uL   Eosinophils Relative 2 %   Eosinophils Absolute 0.2 0.0 - 0.5 K/uL   Basophils Relative 0 %   Basophils Absolute 0.0 0.0 - 0.1 K/uL   Immature Granulocytes 1 %   Abs Immature Granulocytes 0.10 (H) 0.00 - 0.07 K/uL  Comprehensive metabolic panel     Status: Abnormal   Collection Time: 05/26/23  9:21 PM  Result Value Ref Range   Sodium 138 135 - 145 mmol/L   Potassium 5.3 (H) 3.5 - 5.1 mmol/L   Chloride 99 98 - 111 mmol/L   CO2 29 22 - 32 mmol/L   Glucose, Bld 136 (H) 70 - 99 mg/dL   BUN 38 (H) 8 - 23 mg/dL   Creatinine, Ser 0.86 (H) 0.61 - 1.24 mg/dL   Calcium 9.2 8.9 - 57.8 mg/dL   Total Protein 6.2 (L) 6.5 - 8.1 g/dL   Albumin 3.1 (L) 3.5 - 5.0 g/dL   AST 16 15 - 41 U/L   ALT 15 0 - 44 U/L   Alkaline Phosphatase 71 38 - 126 U/L   Total Bilirubin 0.2 (L) 0.3 - 1.2 mg/dL   GFR, Estimated 39 (L) >60 mL/min   Anion gap 10 5 - 15  Troponin I (High Sensitivity)     Status: None   Collection Time: 05/26/23  9:21 PM  Result  Value Ref Range   Troponin I (High Sensitivity) 7 <18 ng/L  Brain natriuretic peptide     Status: Abnormal   Collection Time: 05/26/23  9:21 PM  Result Value Ref Range   B Natriuretic Peptide 319.7 (H) 0.0 - 100.0 pg/mL  Magnesium     Status: None   Collection Time: 05/26/23  9:21 PM  Result Value Ref Range   Magnesium 1.7 1.7 - 2.4 mg/dL  I-Stat venous blood gas, ([facility] ED, [facility], [facility])     Status: Abnormal   Collection Time: 05/26/23  9:42 PM  Result Value Ref Range   pH, Ven 7.331 7.25 - 7.43   pCO2, Ven 59.6 44 - 60 mmHg   pO2, Ven 70 (H) 32 - 45 mmHg   Bicarbonate 31.4 (H) 20.0 - 28.0 mmol/L   TCO2 33 (H) 22 - 32 mmol/L   O2 Saturation 92 %   Acid-Base Excess 4.0 (H) 0.0 - 2.0 mmol/L   Sodium 137 135 - 145 mmol/L   Potassium 5.1 3.5 - 5.1 mmol/L   Calcium, Ion 1.20 1.15 - 1.40 mmol/L   HCT 31.0 (L) 39.0 - 52.0 %  Hemoglobin 10.5 (L) 13.0 - 17.0 g/dL   Sample type VENOUS   SARS Coronavirus 2 by RT PCR (hospital order, performed in [facility] hospital lab) *cepheid single result test* Anterior Nasal Swab     Status: None   Collection Time: 05/26/23  9:56 PM   Specimen: Anterior Nasal Swab  Result Value Ref Range   SARS Coronavirus 2 by RT PCR NEGATIVE NEGATIVE  Lactic acid, plasma     Status: None   Collection Time: 05/26/23 10:41 PM  Result Value Ref Range   Lactic Acid, Venous 0.7 0.5 - 1.9 mmol/L     Assessment and Plan:     Advance Care Planning:   Code Status: Full Code the patient names his wife as his surrogate decision-maker and wants to be full code.  Consults: none  Family Communication: His wife was present at the bedside.  Severity of Illness: The appropriate patient status for this patient is INPATIENT. Inpatient status is judged to be reasonable and necessary in order to provide the required intensity of service to ensure the patient's safety. The patient's presenting symptoms, physical exam findings, and initial radiographic and laboratory data in the  context of their chronic comorbidities is felt to place them at high risk for further clinical deterioration. Furthermore, it is not anticipated that the patient will be medically stable for discharge from the hospital within 2 midnights of admission.   * I certify that at the point of admission it is my clinical judgment that the patient will require inpatient hospital care spanning beyond 2 midnights from the point of admission due to high intensity of service, high risk for further deterioration and high frequency of surveillance required.*  Author: Buena Irish, MD 05/26/2023 11:55 PM  For on call review [email or web address].

## 2023-05-27 ENCOUNTER — Other Ambulatory Visit: Payer: Self-pay

## 2023-05-27 ENCOUNTER — Encounter (HOSPITAL_COMMUNITY): Payer: Self-pay | Admitting: Internal Medicine

## 2023-05-27 DIAGNOSIS — J441 Chronic obstructive pulmonary disease with (acute) exacerbation: Secondary | ICD-10-CM | POA: Diagnosis not present

## 2023-05-27 LAB — BASIC METABOLIC PANEL
Anion gap: 8 (ref 5–15)
BUN: 32 mg/dL — ABNORMAL HIGH (ref 8–23)
CO2: 27 mmol/L (ref 22–32)
Calcium: 9 mg/dL (ref 8.9–10.3)
Chloride: 98 mmol/L (ref 98–111)
Creatinine, Ser: 1.28 mg/dL — ABNORMAL HIGH (ref 0.61–1.24)
GFR, Estimated: 59 mL/min — ABNORMAL LOW (ref >60)
Glucose, Bld: 199 mg/dL — ABNORMAL HIGH (ref 70–99)
Potassium: 5.2 mmol/L — ABNORMAL HIGH (ref 3.5–5.1)
Sodium: 133 mmol/L — ABNORMAL LOW (ref 135–145)

## 2023-05-27 LAB — CBC
HCT: 32.3 % — ABNORMAL LOW (ref 39.0–52.0)
Hemoglobin: 9.9 g/dL — ABNORMAL LOW (ref 13.0–17.0)
MCH: 27 pg (ref 26.0–34.0)
MCHC: 30.7 g/dL (ref 30.0–36.0)
MCV: 88.3 fL (ref 80.0–100.0)
Platelets: 199 10*3/uL (ref 150–400)
RBC: 3.66 MIL/uL — ABNORMAL LOW (ref 4.22–5.81)
RDW: 14.2 % (ref 11.5–15.5)
WBC: 6.6 10*3/uL (ref 4.0–10.5)
nRBC: 0 % (ref 0.0–0.2)

## 2023-05-27 LAB — MAGNESIUM: Magnesium: 2.1 mg/dL (ref 1.7–2.4)

## 2023-05-27 LAB — HIV ANTIBODY (ROUTINE TESTING W REFLEX): HIV Screen 4th Generation wRfx: NONREACTIVE

## 2023-05-27 LAB — TROPONIN I (HIGH SENSITIVITY): Troponin I (High Sensitivity): 6 ng/L (ref <18)

## 2023-05-27 LAB — TSH: TSH: 0.237 u[IU]/mL — ABNORMAL LOW (ref 0.350–4.500)

## 2023-05-27 LAB — POTASSIUM: Potassium: 5.6 mmol/L — ABNORMAL HIGH (ref 3.5–5.1)

## 2023-05-27 MED ORDER — SODIUM CHLORIDE 0.9 % IV SOLN
2.0000 g | INTRAVENOUS | Status: DC
  Administered 2023-05-27 – 2023-05-28 (×2): 2 g via INTRAVENOUS
  Filled 2023-05-27 (×2): qty 20

## 2023-05-27 MED ORDER — INFLUENZA VAC A&B SURF ANT ADJ 0.5 ML IM SUSY
0.5000 mL | PREFILLED_SYRINGE | INTRAMUSCULAR | Status: AC
  Administered 2023-05-28: 0.5 mL via INTRAMUSCULAR
  Filled 2023-05-27: qty 0.5

## 2023-05-27 MED ORDER — METOPROLOL TARTRATE 5 MG/5ML IV SOLN: 5.0000 mg | INTRAVENOUS | Status: DC | PRN

## 2023-05-27 MED ORDER — AZITHROMYCIN 500 MG PO TABS
500.0000 mg | ORAL_TABLET | Freq: Every day | ORAL | Status: DC
  Administered 2023-05-27 – 2023-05-28 (×2): 500 mg via ORAL
  Filled 2023-05-27 (×2): qty 1

## 2023-05-27 MED ORDER — LACTATED RINGERS IV BOLUS
500.0000 mL | Freq: Once | INTRAVENOUS | Status: AC
  Administered 2023-05-27: 500 mL via INTRAVENOUS

## 2023-05-27 NOTE — Evaluation (Signed)
Physical Therapy Evaluation/ Discharge Patient Details Name: DELONTAY MULANAX MRN: [id number] DOB: 03-12-1951 Today's Date: 05/27/2023  History of Present Illness  72 yo male admitt 9/4 with SOB and COPD exacerbation. PMhx: COPD, lung CA s/p LUL resection, adjustment disorder, BPH, HTN, gout, non-Hodgkin's lymphoma in remission  Clinical Impression  Pt pleasant and reports recent decreased activity tolerance due to desaturation requiring supplemental O2 use in community. Pt able to stand and walk 100' on RA prior to desaturation to 87%, returned to 2L with recovery to 94% with additional desaturation with gait, ultimately requiring 3L to maintain 92% with hall ambulation. Pt able to be static without supplemental support. Pt has home pulse ox, monitors regularly and appropriately adjusts and wears O2 as needed. No strength, balance or significant functional deficits at present that require acute therapy. Will sign off with pt and wife aware and agreeable.         If plan is discharge home, recommend the following:     Can travel by private vehicle        Equipment Recommendations None recommended by PT  Recommendations for Other Services       Functional Status Assessment Patient has not had a recent decline in their functional status     Precautions / Restrictions Precautions Precautions: Other (comment) Precaution Comments: watch sats      Mobility  Bed Mobility               General bed mobility comments: standing on arrival and end of session    Transfers Overall transfer level: Modified independent                      Ambulation/Gait Ambulation/Gait assistance: Independent Gait Distance (Feet): 450 Feet Assistive device: None Gait Pattern/deviations: WFL(Within Functional Limits)       General Gait Details: pt with normal gait, able to walk with O2 tank and control without assist. pt able to walk 100' on RA prior to drop to 87%, required 3L to  maintain 92% with increased distance. no noted balance deficits with gait  Stairs Stairs: Yes Stairs assistance: Modified independent (Device/Increase time) Stair Management: One rail Right, Alternating pattern, Forwards Number of Stairs: 3    Wheelchair Mobility     Tilt Bed    Modified Rankin (Stroke Patients Only)       Balance Overall balance assessment: No apparent balance deficits (not formally assessed)                                           Pertinent Vitals/Pain Pain Assessment Pain Assessment: No/denies pain    Home Living Family/patient expects to be discharged to:: Private residence Living Arrangements: Spouse/significant other Available Help at Discharge: Family;Available 24 hours/day Type of Home: House Home Access: Stairs to enter Entrance Stairs-Rails: Right;Left;Can reach both Entrance Stairs-Number of Steps: 3   Home Layout: One level Home Equipment: None Additional Comments: wife, Wille Celeste    Prior Function Prior Level of Function : Independent/Modified Independent             Mobility Comments: independent ADLs Comments: was working until 2 years ago due to accident and injury to R hand. Up to 3L O2 at night and as needed at home     Extremity/Trunk Assessment   Upper Extremity Assessment Upper Extremity Assessment: Overall WFL for tasks assessed RUE Deficits /  Details: limited use of R hand after accident 2 years ago per pt. Using functionally for BADL, but unable to make full fist and generally weaker than the L. Pt reports unable to hold more tha  10 lbs with this hand    Lower Extremity Assessment Lower Extremity Assessment: Overall WFL for tasks assessed    Cervical / Trunk Assessment Cervical / Trunk Assessment: Normal  Communication   Communication Communication: No apparent difficulties (mildly hard of hearing)  Cognition Arousal: Alert Behavior During Therapy: WFL for tasks assessed/performed Overall  Cognitive Status: Within Functional Limits for tasks assessed                                          General Comments General comments (skin integrity, edema, etc.): See ADL general comments for vitals    Exercises     Assessment/Plan    PT Assessment Patient does not need any further PT services  PT Problem List         PT Treatment Interventions      PT Goals (Current goals can be found in the Care Plan section)  Acute Rehab PT Goals PT Goal Formulation: All assessment and education complete, DC therapy    Frequency       Co-evaluation               AM-PAC PT "6 Clicks" Mobility  Outcome Measure Help needed turning from your back to your side while in a flat bed without using bedrails?: None Help needed moving from lying on your back to sitting on the side of a flat bed without using bedrails?: None Help needed moving to and from a bed to a chair (including a wheelchair)?: None Help needed standing up from a chair using your arms (e.g., wheelchair or bedside chair)?: None Help needed to walk in hospital room?: None Help needed climbing 3-5 steps with a railing? : None 6 Click Score: 24    End of Session Equipment Utilized During Treatment: Oxygen Activity Tolerance: Patient tolerated treatment well Patient left: with family/visitor present Nurse Communication: Mobility status PT Visit Diagnosis: Other abnormalities of gait and mobility (R26.89)    Time: 1324-4010 PT Time Calculation (min) (ACUTE ONLY): 15 min   Charges:   PT Evaluation $PT Eval Low Complexity: 1 Low   PT General Charges $$ ACUTE PT VISIT: 1 Visit         Merryl Hacker, PT Acute Rehabilitation Services Office: [phone]   Enedina Finner Flint Hakeem 05/27/2023, 10:41 AM

## 2023-05-27 NOTE — Discharge Instructions (Signed)

## 2023-05-27 NOTE — Evaluation (Signed)
Occupational Therapy Evaluation Patient Details Name: [name] MRN: [id number] DOB: 12-May-1951 Today's Date: 05/27/2023   History of Present Illness 72 yo male admitt 9/4 with SOB and COPD exacerbation. PMhx: COPD, lung CA s/p LUL resection, adjustment disorder, BPH, HTN, gout, non-Hodgkin's lymphoma in remission   Clinical Impression   PTA, pt lived with wife and was independent in ADL and IADL. Upon eval, pt performing ADL with mod I and able to manage own O2 tank without assist or cues. Pt demonstrates good self monitoring of O2 during session and with good insight into when he needs O2. Did desat with O2 doffed during functional mobility to 87% without feeling winded. Donned 3L and >93% within 1 minute to recover. Do not suspect need for follow up OT after discharge.        If plan is discharge home, recommend the following: Other (comment) (on pt request)    Functional Status Assessment  Patient has had a recent decline in their functional status and demonstrates the ability to make significant improvements in function in a reasonable and predictable amount of time.  Equipment Recommendations  None recommended by OT    Recommendations for Other Services       Precautions / Restrictions        Mobility Bed Mobility Overal bed mobility: Modified Independent                  Transfers Overall transfer level: Modified independent Equipment used: None                      Balance Overall balance assessment: Modified Independent                                         ADL either performed or assessed with clinical judgement   ADL Overall ADL's : Modified independent                                       General ADL Comments: mod If or BADL. Good ability to determine when he needs to don his O2. Desatting in hall with O2 doffed to 87% but not significantly short of breath. Pt needing 3L and up to one minute to  recover. Pt and wife both report pt is confident with self-monitoring at home. Pt interested in energy conservation eduation, so will follow for one more session     Vision Baseline Vision/History: 1 Wears glasses (readers) Ability to See in Adequate Light: 0 Adequate Patient Visual Report: No change from baseline Vision Assessment?: No apparent visual deficits     Perception Perception: Not tested       Praxis Praxis: Not tested       Pertinent Vitals/Pain Pain Assessment Pain Assessment: No/denies pain     Extremity/Trunk Assessment Upper Extremity Assessment Upper Extremity Assessment: Overall WFL for tasks assessed RUE Deficits / Details: limited use of R hand after accident 2 years ago per pt. Using functionally for BADL, but unable to make full fist and generally weaker than the L. Pt reports unable to hold more tha  10 lbs with this hand   Lower Extremity Assessment Lower Extremity Assessment: Defer to PT evaluation       Communication Communication Communication: No apparent difficulties (mildly hard of hearing)  Cognition Arousal: Alert Behavior During Therapy: WFL for tasks assessed/performed Overall Cognitive Status: Within Functional Limits for tasks assessed                                       General Comments  See ADL general comments for vitals    Exercises     Shoulder Instructions      Home Living Family/patient expects to be discharged to:: Private residence Living Arrangements: Spouse/significant other Available Help at Discharge: Family;Available 24 hours/day Type of Home: House Home Access: Stairs to enter [employer] of Steps: 3 Entrance Stairs-Rails: Right;Left;Can reach both Home Layout: One level     Bathroom Shower/Tub: Chief Strategy Officer: Standard     Home Equipment: None   Additional Comments: wife, Janie      Prior Functioning/Environment Prior Level of Function :  Independent/Modified Independent             Mobility Comments: no AD ADLs Comments: was working until 2 years ago due to accident and injury to R hand. Up to 3L O2 at night and as needed at home        OT Problem List: Decreased strength;Decreased activity tolerance;Cardiopulmonary status limiting activity      OT Treatment/Interventions: Therapeutic exercise;Self-care/ADL training;DME and/or AE instruction;Therapeutic activities;Balance training;Patient/family education;Energy conservation    OT Goals(Current goals can be found in the care plan section) Acute Rehab OT Goals Patient Stated Goal: get better and go home OT Goal Formulation: With patient/family Time For Goal Achievement: 06/10/23 Potential to Achieve Goals: Good  OT Frequency: Min 1X/week (one more session for energy conservation ed)    Co-evaluation              AM-PAC OT "6 Clicks" Daily Activity     Outcome Measure Help from another person eating meals?: None Help from another person taking care of personal grooming?: None Help from another person toileting, which includes using toliet, bedpan, or urinal?: None Help from another person bathing (including washing, rinsing, drying)?: None Help from another person to put on and taking off regular upper body clothing?: None Help from another person to put on and taking off regular lower body clothing?: None 6 Click Score: 24   End of Session Equipment Utilized During Treatment: Gait belt Nurse Communication: Mobility status  Activity Tolerance: Patient tolerated treatment well Patient left: Other (comment) (with PT)  OT Visit Diagnosis: Muscle weakness (generalized) (M62.81)                Time: 1610-9604 OT Time Calculation (min): 22 min Charges:  OT General Charges $OT Visit: 1 Visit OT Evaluation $OT Eval Low Complexity: 1 Low  Tyler Deis, OTR/L [facility] Acute Rehabilitation Office: [phone]   Myrla Halsted 05/27/2023, 10:27  AM

## 2023-05-27 NOTE — Progress Notes (Signed)
PROGRESS NOTE    [name]  VFI:[id number] DOB: 07-05-1951 DOA: 05/26/2023 PCP: [name], PA    Brief Narrative: This 72 years old male with PMH significant for COPD on 2 L of supplemental oxygen as needed at home, A-fib on Eliquis, diastolic CHF, lung cancer in remission, non-Hodgkin's lymphoma in remission and clean cardiac cath 2 months ago presented in the ED with complaints of not able to breathe.  Patient started wheezing this morning which has progressively gotten worse. Patient denies any fever or chills.  Patient was hospitalized less than a month ago for pneumonia.  He was found to be hypoxic in the E, D requiring up to 6 L of oxygen to maintain saturation above 92%.  Patient was treated in the ED as COPD exacerbation,  Chest x-ray concerning for pneumonia.  Patient is admitted and started on IV antibiotics for suspected pneumonia.  Assessment & Plan:   Active Problems:   COPD with acute exacerbation (HCC)  Acute on chronic hypoxic respiratory failure: COPD exacerbation : Likely secondary to COPD exacerbation and pneumonia. Patient presented with worsening shortness of breath. He was significantly hypoxic requiring up to 6 L of oxygen. Continue supplemental oxygen and wean as tolerated. Continue IV ceftriaxone and Zithromax. Continue with scheduled and as needed bronchodilators. Continue prednisone 40 mg daily x 5 days.  Community-acquired pneumonia. Continue azithromycin and ceftriaxone.  Atrial Fibrillation: Continue Eliquis, heart rate controlled.  Continue metoprolol  Essential hypertension Continue amlodipine, metoprolol.  Hyperlipidemia Continue Crestor.  Hypothyroidism: Continue levothyroxine.  DVT prophylaxis: Eliquis Code Status:Full code Family Communication:  Wife at bed side. Disposition Plan:     Status is: Inpatient Remains inpatient appropriate because: Admitted for acute on chronic hypoxic respiratory failure likely secondary to  COPD and pneumonia.  Consultants:  None  Procedures: None  Antimicrobials:  Anti-infectives (From admission, onward)    Start     Dose/Rate Route Frequency Ordered Stop   05/27/23 1000  cefTRIAXone (ROCEPHIN) 2 g in sodium chloride 0.9 % 100 mL IVPB        2 g 200 mL/hr over 30 Minutes Intravenous Every 24 hours 05/27/23 0902 05/31/23 0959   05/27/23 0030  ceFEPIme (MAXIPIME) 2 g in sodium chloride 0.9 % 100 mL IVPB  Status:  Discontinued        2 g 200 mL/hr over 30 Minutes Intravenous Every 12 hours 05/26/23 2355 05/27/23 0902   05/27/23 0002  azithromycin (ZITHROMAX) tablet 500 mg        500 mg Oral Daily 05/27/23 0002 05/31/23 0959   05/27/23 0000  doxycycline (VIBRAMYCIN) capsule 100 mg  Status:  Discontinued        100 mg Oral 2 times daily 05/26/23 2355 05/27/23 0002   05/26/23 2230  cefTRIAXone (ROCEPHIN) 1 g in sodium chloride 0.9 % 100 mL IVPB        1 g 200 mL/hr over 30 Minutes Intravenous  Once 05/26/23 2219 05/26/23 2345   05/26/23 2230  azithromycin (ZITHROMAX) 500 mg in sodium chloride 0.9 % 250 mL IVPB        500 mg 250 mL/hr over 60 Minutes Intravenous  Once 05/26/23 2219 05/27/23 0013      Subjective: Patient was seen and examined at bedside.  Overnight events noted. Patient was participating in physical therapy.  Still remains on 3 L of supplemental oxygen.   He reports feeling improved.  Objective: Vitals:   05/27/23 0812 05/27/23 0856 05/27/23 0909 05/27/23 1037  BP: 139/67  119/70   Pulse: (!) 113 93 83   Resp:  19    Temp:      TempSrc:      SpO2: 98% 97% 99% 92%  Weight:      Height:        Intake/Output Summary (Last 24 hours) at 05/27/2023 1255 Last data filed at 05/27/2023 0700 Gross per 24 hour  Intake 591.52 ml  Output 1035 ml  Net -443.48 ml   Filed Weights   05/27/23 0304  Weight: 90.7 kg    Examination:  General exam: Appears calm and comfortable, not in any distress. Respiratory system: Mild wheezing noted. Respiratory  effort normal.  RR 16 Cardiovascular system: S1 & S2 heard, regular rate and rhythm, no murmur.  No pedal edema. Gastrointestinal system: Abdomen is non distended, soft and non tender. Normal bowel sounds heard. Central nervous system: Alert and oriented x 3. No focal neurological deficits. Extremities: No edema, no cyanosis, no clubbing Skin: No rashes, lesions or ulcers Psychiatry: Judgement and insight appear normal. Mood & affect appropriate.     Data Reviewed: I have personally reviewed following labs and imaging studies  CBC: Recent Labs  Lab 05/26/23 2121 05/26/23 2142 05/27/23 0905  WBC 8.7  --  6.6  NEUTROABS 5.9  --   --   HGB 9.9* 10.5* 9.9*  HCT 32.2* 31.0* 32.3*  MCV 91.2  --  88.3  PLT 192  --  199   Basic Metabolic Panel: Recent Labs  Lab 05/25/23 0800 05/26/23 2121 05/26/23 2142 05/27/23 0905  NA 139 138 137 133*  K 5.0 5.3* 5.1 5.2*  CL 100 99  --  98  CO2 28 29  --  27  GLUCOSE 97 136*  --  199*  BUN 29* 38*  --  32*  CREATININE 1.47* 1.81*  --  1.28*  CALCIUM 9.5 9.2  --  9.0  MG  --  1.7  --  2.1   GFR: Estimated Creatinine Clearance: 57.3 mL/min (A) (by C-G formula based on SCr of 1.28 mg/dL (H)). Liver Function Tests: Recent Labs  Lab 05/26/23 2121  AST 16  ALT 15  ALKPHOS 71  BILITOT 0.2*  PROT 6.2*  ALBUMIN 3.1*   No results for input(s): "LIPASE", "AMYLASE" in the last 168 hours. No results for input(s): "AMMONIA" in the last 168 hours. Coagulation Profile: No results for input(s): "INR", "PROTIME" in the last 168 hours. Cardiac Enzymes: No results for input(s): "CKTOTAL", "CKMB", "CKMBINDEX", "TROPONINI" in the last 168 hours. BNP (last 3 results) Recent Labs    05/18/23 1548 05/25/23 0800  PROBNP 1,876* 2,365*   HbA1C: No results for input(s): "HGBA1C" in the last 72 hours. CBG: No results for input(s): "GLUCAP" in the last 168 hours. Lipid Profile: No results for input(s): "CHOL", "HDL", "LDLCALC", "TRIG", "CHOLHDL",  "LDLDIRECT" in the last 72 hours. Thyroid Function Tests: Recent Labs    05/27/23 0905  TSH 0.237*   Anemia Panel: No results for input(s): "VITAMINB12", "FOLATE", "FERRITIN", "TIBC", "IRON", "RETICCTPCT" in the last 72 hours. Sepsis Labs: Recent Labs  Lab 05/26/23 2241  LATICACIDVEN 0.7    Recent Results (from the past 240 hour(s))  SARS Coronavirus 2 by RT PCR (hospital order, performed in [facility] hospital lab) *cepheid single result test* Anterior Nasal Swab     Status: None   Collection Time: 05/26/23  9:56 PM   Specimen: Anterior Nasal Swab  Result Value Ref Range Status   SARS Coronavirus 2 by RT  PCR NEGATIVE NEGATIVE Final    Comment: Performed at [facility], [address], [place], [place] [address]  Blood culture (routine x 2)     Status: None (Preliminary result)   Collection Time: 05/26/23 10:41 PM   Specimen: BLOOD LEFT ARM  Result Value Ref Range Status   Specimen Description BLOOD LEFT ARM  Final   Special Requests   Final    BOTTLES DRAWN AEROBIC AND ANAEROBIC Blood Culture results may not be optimal due to an excessive volume of blood received in culture bottles   Culture   Final    NO GROWTH < 12 HOURS Performed at [facility], [address], [place], [place] [address]    Report Status PENDING  Incomplete  Blood culture (routine x 2)     Status: None (Preliminary result)   Collection Time: 05/26/23 10:41 PM   Specimen: BLOOD RIGHT HAND  Result Value Ref Range Status   Specimen Description BLOOD RIGHT HAND  Final   Special Requests   Final    BOTTLES DRAWN AEROBIC AND ANAEROBIC Blood Culture adequate volume   Culture   Final    NO GROWTH < 12 HOURS Performed at [facility], [address] [place], [place] [address]    Report Status PENDING  Incomplete         Radiology Studies: DG Chest Port 1 View  Result Date: 05/26/2023 CLINICAL DATA:  Shortness of breath. History of COPD. upper lobectomy EXAM: PORTABLE CHEST 1  VIEW COMPARISON:  05/17/2023 FINDINGS: Right chest wall Port-A-Cath with tip in the low SVC. Stable cardiomegaly. Aortic atherosclerotic calcification. Postoperative change of right upper lobectomy. Reticulonodular opacities in the left mid and lower lung. Small bilateral pleural effusions. No pneumothorax. Hyperinflation and chronic bronchitic changes. IMPRESSION: Airway infection/inflammation and/or aspiration in the left lower lung. Small bilateral pleural effusions. Electronically Signed   By: Minerva Fester M.D.   On: 05/26/2023 22:05    Scheduled Meds:  allopurinol  300 mg Oral Daily   amLODipine  5 mg Oral Daily   apixaban  5 mg Oral BID   azithromycin  500 mg Oral Daily   ezetimibe  10 mg Oral Daily   finasteride  5 mg Oral Daily   [START ON 05/28/2023] influenza vaccine adjuvanted  0.5 mL Intramuscular Tomorrow-1000   ipratropium-albuterol  3 mL Nebulization Q6H   levothyroxine  175 mcg Oral Daily   linaclotide  290 mcg Oral Daily   lisinopril  10 mg Oral Daily   magnesium chloride  64 mg Oral Daily   metoprolol succinate  25 mg Oral Daily   pantoprazole  40 mg Oral Daily   predniSONE  40 mg Oral Q breakfast   rosuvastatin  10 mg Oral Daily   Continuous Infusions:  cefTRIAXone (ROCEPHIN)  IV 2 g (05/27/23 1055)     LOS: 1 day    Time spent: 50 mins    Willeen Niece, MD [facility]   If 7PM-7AM, please contact night-coverage

## 2023-05-27 NOTE — Plan of Care (Signed)

## 2023-05-27 NOTE — ED Notes (Signed)
ED TO INPATIENT HANDOFF REPORT  ED Nurse Name and Phone #: Joneen Roach RN [phone]  S Name/Age/Gender [name] 72 y.o. male Room/Bed: TRACC/TRACC  Code Status   Code Status: Full Code  Home/SNF/Other Home Patient oriented to: self, place, time, and situation Is this baseline? Yes   Triage Complete: Triage complete  Chief Complaint COPD with acute exacerbation (HCC) [J44.1]  Triage Note Pt to ED via [facility] from home. Pt lives at home with wife. Pt called out for SOB. Pt reports his O2 was dropping down to the 70% on RA today. Pt has PRN O2 at home. Pt reports wife put him on 4L [place] and SPO2 came up to 92%. Pt has dx pna x3 weeks and is on antibiotics. EMS gave duoneb en route with improvement. Pt reports productive cough. Pt denies CP.   GCS 15  EMS Vitals: 125 solu medrol 7.5 albueterol 0.5 atrovent  18 RFA 85 HR 122/62 96% on duo neb   Allergies Allergies  Allergen Reactions   Lipitor [Atorvastatin] Other (See Comments)    Myalgias  Weakness  Fatigue    Omnipaque [Iohexol] Itching and Other (See Comments)    When asked about contrast media today, pt stated that last time he had the contrast that he was red all over and itchy from head to toe.     Level of Care/Admitting Diagnosis ED Disposition     ED Disposition  Admit   Condition  --   Comment  Hospital Area: [facility] [[id number]]  Level of Care: Med-Surg [[id number]]  May admit patient to [facility] or [facility] if equivalent level of care is available:: Yes  Covid Evaluation: Confirmed COVID Negative  Diagnosis: COPD with acute exacerbation [facility]) [[id number]]  Admitting Physician: Buena Irish [[id number]]  Attending Physician: Buena Irish [phone]  Certification:: I certify this patient will need inpatient services for at least 2 midnights  Expected Medical Readiness: 05/29/2023          B Medical/Surgery History Past Medical History:  Diagnosis Date   Adjustment  disorder with other symptom 11/27/2015   AKI (acute kidney injury) (HCC) 01/14/2016   Aortic atherosclerosis (HCC) 01/26/2023   Arthritis    Atrial fibrillation (HCC) 10/26/2011   BPH (benign prostatic hyperplasia) 2011   BRONCHOPNEUMONIA ORGANISM UNSPECIFIED 06/12/2010         Replacing diagnoses that were inactivated after the 12/21/22 regulatory import   Carpal tunnel syndrome, right upper limb 11/03/2022   Chronic respiratory failure with hypoxia (HCC) 08/27/2015   Congestive heart failure (HCC)    COPD (chronic obstructive pulmonary disease) (HCC)    COPD, severe (HCC) 11/27/2015   12/18/2015-pulmonary function test- FVC 2.75 (55% predicted), postbronchodilator ratio 56, postbronchodilator FEV1 1.66 (45% predicted), no significant bronchodilator response, mid flow reversibility, DLCO 33  >>>Severe obstructive airways disease, Severe diffusion defect      Coronary artery calcification 01/26/2023   Depression    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 [facility])    Drug-induced neutropenia (HCC) 10/08/2015   Encounter for antineoplastic chemotherapy 10/08/2015   Essential hypertension 06/05/2008   Qualifier: Diagnosis of   By: Truman Hayward Duncan Dull), [name]       Gastrointestinal obstruction (HCC)    Ileus   GERD (gastroesophageal reflux disease)    Gout attack 08/29/2012   Hx of colonoscopy    Hypothyroidism 03/30/2012   Lesion of ulnar nerve, right upper limb 11/03/2022   Lung cancer (HCC) 2011  Lymphadenopathy syndrome 09/12/2015   Mediastinal adenopathy 09/12/2015   NHL (non-Hodgkin's lymphoma) (HCC) 09/25/2015   Numbness of right hand 06/28/2022   Obesity    Open wound of right hand without foreign body 07/27/2021   Other dysphagia 11/11/2010   Qualifier: Diagnosis of   By: Marchelle Gearing MD, [name]       Polycythemia 01/26/2023   POLYCYTHEMIA 06/05/2008   Qualifier: Diagnosis of   By: Waldo Laine), [name]       Port catheter in place 08/20/2016    Post-nasal drip 10/04/2011   Primary cancer of right upper lobe of lung (HCC) 10/25/2019   Squamous cell   Pulmonary hypertension (HCC)    Pulmonary nodule, right 10/03/2019   Pure hypercholesterolemia 12/03/2011   Recurrent pneumonia 02/10/2012   Routine general medical examination at a health care facility 12/03/2011   Sepsis (HCC) 01/14/2016   Severe sepsis (HCC) 01/14/2016   Squamous cell carcinoma lung (HCC) 09/26/2010   S/p LUL resection Dr [name] dec 2011      Status post bronchoscopy with biopsy    Stiffness of finger joint of right hand 09/24/2021   Streptococcal pneumonia (HCC) 04/2008   Testicular swelling 09/12/2015   Past Surgical History:  Procedure Laterality Date   APPENDECTOMY     BACK SURGERY     CARDIOVERSION  11/27/2011   Procedure: CARDIOVERSION;  Surgeon: Vesta Mixer, MD;  Location: [facility] ENDOSCOPY;  Service: Cardiovascular;  Laterality: N/A;   COLONOSCOPY     FUDUCIAL PLACEMENT Right 10/13/2019   Procedure: Placement Of Fuducial;  Surgeon: Leslye Peer, MD;  Location: [facility] OR;  Service: Thoracic;  Laterality: Right;  Right Upper Lobe    LEFT HEART CATH AND CORONARY ANGIOGRAPHY N/A 04/12/2023   Procedure: LEFT HEART CATH AND CORONARY ANGIOGRAPHY;  Surgeon: Marykay Lex, MD;  Location: [facility] INVASIVE CV LAB;  Service: Cardiovascular;  Laterality: N/A;   LUNG CANCER SURGERY  2011   UPPER GI ENDOSCOPY     VIDEO BRONCHOSCOPY WITH ENDOBRONCHIAL NAVIGATION N/A 10/13/2019   Procedure: VIDEO BRONCHOSCOPY WITH ENDOBRONCHIAL NAVIGATION;  Surgeon: Leslye Peer, MD;  Location: [facility] OR;  Service: Thoracic;  Laterality: N/A;   VIDEO BRONCHOSCOPY WITH ENDOBRONCHIAL ULTRASOUND N/A 10/13/2019   Procedure: VIDEO BRONCHOSCOPY WITH ENDOBRONCHIAL ULTRASOUND;  Surgeon: Leslye Peer, MD;  Location: [facility] OR;  Service: Thoracic;  Laterality: N/A;     A IV Location/Drains/Wounds Patient Lines/Drains/Airways Status     Active Line/Drains/Airways     Name Placement date Placement  time Site Days   Implanted Port 10/03/15 Right Chest 10/03/15  1505  Chest  2793   Peripheral IV 05/26/23 18 G Anterior;Right Forearm 05/26/23  2122  Forearm  1   Peripheral IV 05/26/23 20 G Posterior;Right Hand 05/26/23  2246  Hand  1   Wound / Incision (Open or Dehisced) Hand Right skin tear --  --  Hand  --            Intake/Output Last 24 hours  Intake/Output Summary (Last 24 hours) at 05/27/2023 0200 Last data filed at 05/27/2023 0027 Gross per 24 hour  Intake 351.52 ml  Output 360 ml  Net -8.48 ml    Labs/Imaging Results for orders placed or performed during the hospital encounter of 05/26/23 (from the past 48 hour(s))  CBC with Differential     Status: Abnormal   Collection Time: 05/26/23  9:21 PM  Result Value Ref Range   WBC 8.7 4.0 - 10.5 K/uL   RBC 3.53 (L) 4.22 -  5.81 MIL/uL   Hemoglobin 9.9 (L) 13.0 - 17.0 g/dL   HCT 96.0 (L) 45.4 - 09.8 %   MCV 91.2 80.0 - 100.0 fL   MCH 28.0 26.0 - 34.0 pg   MCHC 30.7 30.0 - 36.0 g/dL   RDW 11.9 14.7 - 82.9 %   Platelets 192 150 - 400 K/uL   nRBC 0.0 0.0 - 0.2 %   Neutrophils Relative % 68 %   Neutro Abs 5.9 1.7 - 7.7 K/uL   Lymphocytes Relative 20 %   Lymphs Abs 1.7 0.7 - 4.0 K/uL   Monocytes Relative 9 %   Monocytes Absolute 0.8 0.1 - 1.0 K/uL   Eosinophils Relative 2 %   Eosinophils Absolute 0.2 0.0 - 0.5 K/uL   Basophils Relative 0 %   Basophils Absolute 0.0 0.0 - 0.1 K/uL   Immature Granulocytes 1 %   Abs Immature Granulocytes 0.10 (H) 0.00 - 0.07 K/uL    Comment: Performed at [facility], [address], [place], [place] [address]  Comprehensive metabolic panel     Status: Abnormal   Collection Time: 05/26/23  9:21 PM  Result Value Ref Range   Sodium 138 135 - 145 mmol/L   Potassium 5.3 (H) 3.5 - 5.1 mmol/L   Chloride 99 98 - 111 mmol/L   CO2 29 22 - 32 mmol/L   Glucose, Bld 136 (H) 70 - 99 mg/dL    Comment: Glucose reference range applies only to samples taken after fasting for at least 8 hours.    BUN 38 (H) 8 - 23 mg/dL   Creatinine, Ser 0.86 (H) 0.61 - 1.24 mg/dL   Calcium 9.2 8.9 - 57.8 mg/dL   Total Protein 6.2 (L) 6.5 - 8.1 g/dL   Albumin 3.1 (L) 3.5 - 5.0 g/dL   AST 16 15 - 41 U/L   ALT 15 0 - 44 U/L   Alkaline Phosphatase 71 38 - 126 U/L   Total Bilirubin 0.2 (L) 0.3 - 1.2 mg/dL   GFR, Estimated 39 (L) >60 mL/min    Comment: (NOTE) Calculated using the CKD-EPI Creatinine Equation (2021)    Anion gap 10 5 - 15    Comment: Performed at [facility], [address], [place], [place] [address]  Troponin I (High Sensitivity)     Status: None   Collection Time: 05/26/23  9:21 PM  Result Value Ref Range   Troponin I (High Sensitivity) 7 <18 ng/L    Comment: (NOTE) Elevated high sensitivity troponin I (hsTnI) values and significant  changes across serial measurements may suggest ACS but many other  chronic and acute conditions are known to elevate hsTnI results.  Refer to the "Links" section for chest pain algorithms and additional  guidance. Performed at [facility], [address], [place], [place] [address]   Brain natriuretic peptide     Status: Abnormal   Collection Time: 05/26/23  9:21 PM  Result Value Ref Range   B Natriuretic Peptide 319.7 (H) 0.0 - 100.0 pg/mL    Comment: Performed at [facility], [address] [place], [place] [address]  Magnesium     Status: None   Collection Time: 05/26/23  9:21 PM  Result Value Ref Range   Magnesium 1.7 1.7 - 2.4 mg/dL    Comment: Performed at [facility], [address] [place], [place] [address]  I-Stat venous blood gas, [facility] ED, [facility], [facility])     Status: Abnormal  Collection Time: 05/26/23  9:42 PM  Result Value Ref Range   pH, Ven 7.331 7.25 - 7.43   pCO2, Ven 59.6 44 - 60 mmHg   pO2, Ven 70 (H) 32 - 45 mmHg   Bicarbonate 31.4 (H) 20.0 - 28.0 mmol/L   TCO2 33 (H) 22 - 32 mmol/L   O2 Saturation 92 %   Acid-Base Excess 4.0 (H) 0.0 - 2.0 mmol/L   Sodium 137 135 - 145 mmol/L   Potassium 5.1  3.5 - 5.1 mmol/L   Calcium, Ion 1.20 1.15 - 1.40 mmol/L   HCT 31.0 (L) 39.0 - 52.0 %   Hemoglobin 10.5 (L) 13.0 - 17.0 g/dL   Sample type VENOUS   SARS Coronavirus 2 by RT PCR (hospital order, performed in [facility] hospital lab) *cepheid single result test* Anterior Nasal Swab     Status: None   Collection Time: 05/26/23  9:56 PM   Specimen: Anterior Nasal Swab  Result Value Ref Range   SARS Coronavirus 2 by RT PCR NEGATIVE NEGATIVE    Comment: Performed at [facility], [address] [place], [place] [address]  Lactic acid, plasma     Status: None   Collection Time: 05/26/23 10:41 PM  Result Value Ref Range   Lactic Acid, Venous 0.7 0.5 - 1.9 mmol/L    Comment: Performed at [facility], [address] [place], [place] [address]  Troponin I (High Sensitivity)     Status: None   Collection Time: 05/26/23 11:50 PM  Result Value Ref Range   Troponin I (High Sensitivity) 6 <18 ng/L    Comment: (NOTE) Elevated high sensitivity troponin I (hsTnI) values and significant  changes across serial measurements may suggest ACS but many other  chronic and acute conditions are known to elevate hsTnI results.  Refer to the "Links" section for chest pain algorithms and additional  guidance. Performed at [facility], [address] [place], [place] [address]    DG Chest Port 1 View  Result Date: 05/26/2023 CLINICAL DATA:  Shortness of breath. History of COPD. upper lobectomy EXAM: PORTABLE CHEST 1 VIEW COMPARISON:  05/17/2023 FINDINGS: Right chest wall Port-A-Cath with tip in the low SVC. Stable cardiomegaly. Aortic atherosclerotic calcification. Postoperative change of right upper lobectomy. Reticulonodular opacities in the left mid and lower lung. Small bilateral pleural effusions. No pneumothorax. Hyperinflation and chronic bronchitic changes. IMPRESSION: Airway infection/inflammation and/or aspiration in the left lower lung. Small bilateral pleural effusions.  Electronically Signed   By: Minerva Fester M.D.   On: 05/26/2023 22:05    Pending Labs Unresulted Labs (From admission, onward)     Start     Ordered   05/27/23 0500  Basic metabolic panel  Tomorrow morning,   R        05/26/23 2355   05/27/23 0500  CBC  Tomorrow morning,   R        05/26/23 2355   05/27/23 0500  Magnesium  Tomorrow morning,   R        05/26/23 2355   05/27/23 0500  TSH  Tomorrow morning,   R        05/26/23 2355   05/26/23 2346  HIV Antibody (routine testing w rflx)  (HIV Antibody (Routine testing w reflex) panel)  Once,   R        05/26/23 2355   05/26/23 2220  Lactic acid, plasma  (Lactic Acid)  Now then every 2 hours,  R (with STAT occurrences)      05/26/23 2219   05/26/23 2220  Blood culture (routine x 2)  BLOOD CULTURE X 2,   R (with STAT occurrences)      05/26/23 2219            Vitals/Pain Today's Vitals   05/27/23 0115 05/27/23 0130 05/27/23 0135 05/27/23 0200  BP:  (!) 93/52  104/62  Pulse: (!) 118 (!) 116  (!) 102  Resp: 19 19  18   Temp:   (!) 97.4 F (36.3 C)   TempSrc:      SpO2: 92% 90%  91%  PainSc:        Isolation Precautions No active isolations  Medications Medications  ceFEPIme (MAXIPIME) 2 g in sodium chloride 0.9 % 100 mL IVPB (0 g Intravenous Stopped 05/27/23 0114)  predniSONE (DELTASONE) tablet 40 mg (has no administration in time range)  albuterol (PROVENTIL) (2.5 MG/3ML) 0.083% nebulizer solution 2.5 mg (has no administration in time range)  ipratropium-albuterol (DUONEB) 0.5-2.5 (3) MG/3ML nebulizer solution 3 mL (0 mLs Nebulization Hold 05/27/23 0131)  acetaminophen (TYLENOL) tablet 500 mg (has no administration in time range)    Or  acetaminophen (TYLENOL) suppository 650 mg (has no administration in time range)  ondansetron (ZOFRAN) tablet 4 mg (has no administration in time range)    Or  ondansetron (ZOFRAN) injection 4 mg (has no administration in time range)  sorbitol 70 % solution 30 mL (has no administration in  time range)  melatonin tablet 3 mg (has no administration in time range)  allopurinol (ZYLOPRIM) tablet 300 mg (has no administration in time range)  levothyroxine (SYNTHROID) tablet 175 mcg (has no administration in time range)  linaclotide (LINZESS) capsule 290 mcg (has no administration in time range)  pantoprazole (PROTONIX) EC tablet 40 mg (has no administration in time range)  finasteride (PROSCAR) tablet 5 mg (has no administration in time range)  apixaban (ELIQUIS) tablet 5 mg (has no administration in time range)  magnesium chloride (SLOW-MAG) 64 MG SR tablet 64 mg (has no administration in time range)  amLODipine (NORVASC) tablet 5 mg (has no administration in time range)  ezetimibe (ZETIA) tablet 10 mg (has no administration in time range)  lisinopril (ZESTRIL) tablet 10 mg (has no administration in time range)  metoprolol succinate (TOPROL-XL) 24 hr tablet 25 mg (has no administration in time range)  rosuvastatin (CRESTOR) tablet 10 mg (has no administration in time range)  azithromycin (ZITHROMAX) tablet 500 mg (has no administration in time range)  metoprolol tartrate (LOPRESSOR) injection 5 mg (has no administration in time range)  magnesium sulfate IVPB 2 g 50 mL (0 g Intravenous Stopped 05/26/23 2300)  albuterol (PROVENTIL) (2.5 MG/3ML) 0.083% nebulizer solution 15 mg (15 mg Nebulization Given 05/26/23 2203)  ipratropium (ATROVENT) nebulizer solution 1 mg (1 mg Nebulization Given 05/26/23 2158)  cefTRIAXone (ROCEPHIN) 1 g in sodium chloride 0.9 % 100 mL IVPB (0 g Intravenous Stopped 05/26/23 2345)  azithromycin (ZITHROMAX) 500 mg in sodium chloride 0.9 % 250 mL IVPB (0 mg Intravenous Stopped 05/27/23 0013)  lactated ringers bolus 500 mL (500 mLs Intravenous New Bag/Given 05/27/23 0141)    Mobility walks with person assist     Focused Assessments Pulmonary Assessment Handoff:  Lung sounds: Bilateral Breath Sounds: Diminished, Rhonchi O2 Device: Nasal Cannula O2 Flow Rate (L/min):  5 L/min    R Recommendations: See Admitting Provider Note  Report given to:   Additional Notes:

## 2023-05-27 NOTE — Progress Notes (Signed)
Initial Nutrition Assessment  DOCUMENTATION CODES:   Not applicable  INTERVENTION:  - modify to a 2gmNa diet.   NUTRITION DIAGNOSIS:   Other (Comment) (Unintentional Weight Gain) related to chronic illness (heart failure) as evidenced by other (comment) (+4 lbs in a week).  GOAL:   Patient will meet greater than or equal to 90% of their needs  MONITOR:   PO intake, Weight trends  REASON FOR ASSESSMENT:   Consult Assessment of nutrition requirement/status  ASSESSMENT:   72 y.o. male admits related to SOB. PMH includes: COPD, CHF, lung cancer in remission, non-Hodgkin's lymphoma in remission. Pt is currently receiving medical management related to hypoxic respiratory failure.  Meds reviewed: synthroid, linzess, slow-mag, prednisone. Labs reviewed: K high, BUN/Creatinine elevated.   The pt reports that he ate well for breakfast this am and has a great appetite. Pt does report some weight gain over the past 7 days but states that this is fluid related and issues with constipation. Pt reports that he takes linzess at home to help with his chronic constipation. Pt also has Linzess ordered here as well. Pt states that he was following a low-sodium diet at home. RD briefly reviewed low-sodium diet education with patient. Pt is currently on a Regular diet. Pt states that he would like to have sodium restriction added. RD will modify diet order. Pt is meeting his needs at this time.   NUTRITION - FOCUSED PHYSICAL EXAM:  Flowsheet Row Most Recent Value  Orbital Region No depletion  Upper Arm Region No depletion  Thoracic and Lumbar Region Unable to assess  Buccal Region No depletion  Temple Region No depletion  Clavicle Bone Region No depletion  Clavicle and Acromion Bone Region No depletion  Scapular Bone Region No depletion  Dorsal Hand No depletion  Patellar Region No depletion  Anterior Thigh Region No depletion  Posterior Calf Region No depletion  Edema (RD Assessment) None   Hair Reviewed  Eyes Reviewed  Mouth Reviewed  Skin Reviewed  Nails Reviewed       Diet Order:   Diet Order             Diet regular Room service appropriate? Yes; Fluid consistency: Thin  Diet effective now                   EDUCATION NEEDS:   Not appropriate for education at this time  Skin:  Skin Assessment: Reviewed RN Assessment  Last BM:  9/3  Height:   Ht Readings from Last 1 Encounters:  05/27/23 6' (1.829 m)    Weight:   Wt Readings from Last 1 Encounters:  05/27/23 90.7 kg    Ideal Body Weight:     BMI:  Body mass index is 27.12 kg/m.  Estimated Nutritional Needs:   Kcal:  2200-2600 kcals  Protein:  110-130 gm  Fluid:  2.2-2.6 L  Bethann Humble, RD, LDN, CNSC.

## 2023-05-27 NOTE — ED Notes (Signed)
Notified Dr. Gasper Sells that patients HR is in the 120-125 Afib rhythm. This seemed to be steadily increasing since I took over care at 11 pm. Patient did just finish a continuous neb treatment and feeling jittery. Dr. Gasper Sells to order a PRN.

## 2023-05-27 NOTE — Evaluation (Signed)
Clinical/Bedside Swallow Evaluation Patient Details  Name: [name] MRN: [id number] Date of Birth: [birth year]  Today's Date: 05/27/2023 Time: SLP Start Time (ACUTE ONLY): 3762 SLP Stop Time (ACUTE ONLY): 0851 SLP Time Calculation (min) (ACUTE ONLY): 45 min  Past Medical History:  Past Medical History:  Diagnosis Date   Adjustment disorder with other symptom 11/27/2015   AKI (acute kidney injury) (HCC) 01/14/2016   Aortic atherosclerosis (HCC) 01/26/2023   Arthritis    Atrial fibrillation (HCC) 10/26/2011   BPH (benign prostatic hyperplasia) 2011   BRONCHOPNEUMONIA ORGANISM UNSPECIFIED 06/12/2010         Replacing diagnoses that were inactivated after the 12/21/22 regulatory import   Carpal tunnel syndrome, right upper limb 11/03/2022   Chronic respiratory failure with hypoxia (HCC) 08/27/2015   Congestive heart failure (HCC)    COPD (chronic obstructive pulmonary disease) (HCC)    COPD, severe (HCC) 11/27/2015   12/18/2015-pulmonary function test- FVC 2.75 (55% predicted), postbronchodilator ratio 56, postbronchodilator FEV1 1.66 (45% predicted), no significant bronchodilator response, mid flow reversibility, DLCO 33  >>>Severe obstructive airways disease, Severe diffusion defect      Coronary artery calcification 01/26/2023   Depression    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)    Drug-induced neutropenia (HCC) 10/08/2015   Encounter for antineoplastic chemotherapy 10/08/2015   Essential hypertension 06/05/2008   Qualifier: Diagnosis of   By: Truman Hayward Duncan Dull), [name]       Gastrointestinal obstruction (HCC)    Ileus   GERD (gastroesophageal reflux disease)    Gout attack 08/29/2012   Hx of colonoscopy    Hypothyroidism 03/30/2012   Lesion of ulnar nerve, right upper limb 11/03/2022   Lung cancer (HCC) 2011   Lymphadenopathy syndrome 09/12/2015   Mediastinal adenopathy 09/12/2015   NHL (non-Hodgkin's lymphoma) (HCC) 09/25/2015   Numbness  of right hand 06/28/2022   Obesity    Open wound of right hand without foreign body 07/27/2021   Other dysphagia 11/11/2010   Qualifier: Diagnosis of   By: Marchelle Gearing MD, [name]       Polycythemia 01/26/2023   POLYCYTHEMIA 06/05/2008   Qualifier: Diagnosis of   By: Truman Hayward Duncan Dull), [name]       Port catheter in place 08/20/2016   Post-nasal drip 10/04/2011   Primary cancer of right upper lobe of lung (HCC) 10/25/2019   Squamous cell   Pulmonary hypertension (HCC)    Pulmonary nodule, right 10/03/2019   Pure hypercholesterolemia 12/03/2011   Recurrent pneumonia 02/10/2012   Routine general medical examination at a health care facility 12/03/2011   Sepsis (HCC) 01/14/2016   Severe sepsis (HCC) 01/14/2016   Squamous cell carcinoma lung (HCC) 09/26/2010   S/p LUL resection Dr [name] dec 2011      Status post bronchoscopy with biopsy    Stiffness of finger joint of right hand 09/24/2021   Streptococcal pneumonia (HCC) 04/2008   Testicular swelling 09/12/2015   Past Surgical History:  Past Surgical History:  Procedure Laterality Date   APPENDECTOMY     BACK SURGERY     CARDIOVERSION  11/27/2011   Procedure: CARDIOVERSION;  Surgeon: Vesta Mixer, MD;  Location: [facility] ENDOSCOPY;  Service: Cardiovascular;  Laterality: N/A;   COLONOSCOPY     FUDUCIAL PLACEMENT Right 10/13/2019   Procedure: Placement Of Fuducial;  Surgeon: Leslye Peer, MD;  Location: [facility] OR;  Service: Thoracic;  Laterality: Right;  Right Upper Lobe    LEFT HEART CATH AND CORONARY ANGIOGRAPHY N/A 04/12/2023  Procedure: LEFT HEART CATH AND CORONARY ANGIOGRAPHY;  Surgeon: Marykay Lex, MD;  Location: [facility] INVASIVE CV LAB;  Service: Cardiovascular;  Laterality: N/A;   LUNG CANCER SURGERY  2011   UPPER GI ENDOSCOPY     VIDEO BRONCHOSCOPY WITH ENDOBRONCHIAL NAVIGATION N/A 10/13/2019   Procedure: VIDEO BRONCHOSCOPY WITH ENDOBRONCHIAL NAVIGATION;  Surgeon: Leslye Peer, MD;  Location: [facility] OR;  Service: Thoracic;  Laterality:  N/A;   VIDEO BRONCHOSCOPY WITH ENDOBRONCHIAL ULTRASOUND N/A 10/13/2019   Procedure: VIDEO BRONCHOSCOPY WITH ENDOBRONCHIAL ULTRASOUND;  Surgeon: Leslye Peer, MD;  Location: [facility] OR;  Service: Thoracic;  Laterality: N/A;   HPI:  72 yo male adm to [facility] with respiratory issues - Pt with PMH + for lung cancer and non Hodkins lymphoma s/p XRT and chemo, CHF, GERD.  He has recurrent pneumonias - and reports weight loss *intentional.  Swallow eval ordered. Pt underwent MBS in 2017 with findings of mild dysphagia -silent trace aspiration of thin that cleared, minimal retention with main concern being reflux related aspiration - as pt had reported refluxing after eating pancakes prior to admit.  He is on a PPI and is fluid restricted.  Pt admits to sensation of retention of solids *pointing to lower pharynx* and having discomfort in chest when drinking cold items *? c/w spasm.  He has to see GI as OP for abnormality found on imaging.  He denies significant changes with swallowing since he was seen by this therapist in 2017.    Assessment / Plan / Recommendation  Clinical Impression  Pt familiar to this SLP from prior evaluations in 2017 and 2019.  During prior MBS he silently aspirated trace amount of thin and had minimal pharyngeal retention.   Today he reports his swallow function to be consistent with prior findings.  No focal CN deficits present and voice is clear.  He continues to demonstrate occasional cough with and without intake and admits to occasionally choking on food more than liquids.  States he expectorates food at times - food without secretions - but points to distal region causing SLP to suspect potential referrant sensation from distal esophagus. In addition he reports sensitivity to cold drinks - causing chest pain - ? consistent with esophageal spasm.  Advised him that aspiration will likely not be fully prevented given his suspected multifactorial dysphagia.  Recommend consider dedicated  esophageal eval some point during if pt able and/or follow up with GI as OP.  He is reportedly needing a colonoscopy from findings on prior imaging - 02/2023.  Reviewed recommendation compensation strategies using teach back and written form.  SLP will follow up once to assure pt educated and implmenting compensations as able.  Advised he consider dirnking water for pH neutral status or slightly thicker drinks with meals if increased comfort. SLP Visit Diagnosis: Dysphagia, pharyngeal phase (R13.13);Dysphagia, unspecified (R13.10)    Aspiration Risk  Moderate aspiration risk    Diet Recommendation Regular;Thin liquid    Liquid Administration via: Cup;Straw Medication Administration:  (as tolerated) Supervision: Patient able to self feed Compensations: Slow rate;Small sips/bites (start po with liquids) Postural Changes: Remain upright for at least 30 minutes after po intake;Seated upright at 90 degrees    Other  Recommendations Recommended Consults: Consider esophageal assessment Oral Care Recommendations: Oral care BID;Other (Comment) (PM Most important!)    Recommendations for follow up therapy are one component of a multi-disciplinary discharge planning process, led by the attending physician.  Recommendations may be updated based on patient status, additional functional  criteria and insurance authorization.  Follow up Recommendations        Assistance Recommended at Discharge    Functional Status Assessment Patient has not had a recent decline in their functional status  Frequency and Duration min 1 x/week  1 week       Prognosis Prognosis for improved oropharyngeal function: Good Barriers to Reach Goals: Time post onset      Swallow Study   General Date of Onset: 05/27/23 HPI: 72 yo male adm to [facility] with respiratory issues - Pt with PMH + for lung cancer and non Hodkins lymphoma s/p XRT and chemo, CHF, GERD.  He has recurrent pneumonias - and reports weight loss *intentional.   Swallow eval ordered. Pt underwent MBS in 2017 with findings of mild dysphagia -silent trace aspiration of thin that cleared, minimal retention with main concern being reflux related aspiration - as pt had reported refluxing after eating pancakes prior to admit.  He is on a PPI and is fluid restricted.  Pt admits to sensation of retention of solids *pointing to lower pharynx* and having discomfort in chest when drinking cold items *? c/w spasm.  He has to see GI as OP for abnormality found on imaging.  He denies significant changes with swallowing since he was seen by this therapist in 2017. Type of Study: Bedside Swallow Evaluation Diet Prior to this Study: Regular;Thin liquids (Level 0) Temperature Spikes Noted: No Respiratory Status: Nasal cannula History of Recent Intubation: No Behavior/Cognition: Alert;Cooperative;Pleasant mood Oral Cavity Assessment: Within Functional Limits Oral Care Completed by SLP: No Oral Cavity - Dentition: Adequate natural dentition Vision: Functional for self-feeding Self-Feeding Abilities: Able to feed self Patient Positioning: Upright in bed Baseline Vocal Quality: Normal Volitional Cough: Strong Volitional Swallow: Able to elicit    Oral/Motor/Sensory Function Overall Oral Motor/Sensory Function: Within functional limits   Ice Chips Ice chips: Not tested   Thin Liquid Thin Liquid: Impaired Presentation: Cup;Self Fed;Straw Pharyngeal  Phase Impairments: Cough - Delayed    Nectar Thick Nectar Thick Liquid: Impaired Presentation: Cup;Self Fed Pharyngeal Phase Impairments: Cough - Delayed   Honey Thick Honey Thick Liquid: Not tested   Puree Puree: Within functional limits Presentation: Self Fed;Spoon   Solid     Solid: Impaired Presentation: Self Fed Pharyngeal Phase Impairments: Cough - Delayed      Chales Abrahams 05/27/2023,9:12 AM   Rolena Infante, MS [facility] SLP Acute Rehab Services Office [phone]

## 2023-05-27 NOTE — ED Notes (Signed)
Notified Dr. Gasper Sells that patient's last 3 BPs are trending lower. Last few were 97/47, 101/52. HR is still in the 115-125 range and having to steadily increase O2 to 5 L on [place] from 2. L on [place] as Spo2 decreased to 85%.  Patient doesn't look to be in any distress he's laying comfortably. Received a PRN for metoprolol to address HR earlier however due lower BP unable to give. Dr. Gasper Sells to order a fluid bolus.

## 2023-05-27 NOTE — Progress Notes (Signed)
SATURATION QUALIFICATIONS: (This note is used to comply with regulatory documentation for home oxygen)  Patient Saturations on Room Air at Rest = 95%  Patient Saturations on Room Air while Ambulating = 87%  Patient Saturations on 3 Liters of oxygen while Ambulating = 2%  Please briefly explain why patient needs home oxygen:PT currently requires supplemental oxygen for long hall ambulation  Merryl Hacker, PT Acute Rehabilitation Services Office: [phone]

## 2023-05-28 DIAGNOSIS — J441 Chronic obstructive pulmonary disease with (acute) exacerbation: Secondary | ICD-10-CM | POA: Diagnosis not present

## 2023-05-28 LAB — MAGNESIUM: Magnesium: 2 mg/dL (ref 1.7–2.4)

## 2023-05-28 LAB — BASIC METABOLIC PANEL
Anion gap: 6 (ref 5–15)
BUN: 28 mg/dL — ABNORMAL HIGH (ref 8–23)
CO2: 29 mmol/L (ref 22–32)
Calcium: 9 mg/dL (ref 8.9–10.3)
Chloride: 103 mmol/L (ref 98–111)
Creatinine, Ser: 1 mg/dL (ref 0.61–1.24)
GFR, Estimated: >60 mL/min (ref >60)
Glucose, Bld: 136 mg/dL — ABNORMAL HIGH (ref 70–99)
Potassium: 4.6 mmol/L (ref 3.5–5.1)
Sodium: 138 mmol/L (ref 135–145)

## 2023-05-28 LAB — CBC
HCT: 32.1 % — ABNORMAL LOW (ref 39.0–52.0)
Hemoglobin: 9.7 g/dL — ABNORMAL LOW (ref 13.0–17.0)
MCH: 27.3 pg (ref 26.0–34.0)
MCHC: 30.2 g/dL (ref 30.0–36.0)
MCV: 90.4 fL (ref 80.0–100.0)
Platelets: 207 10*3/uL (ref 150–400)
RBC: 3.55 MIL/uL — ABNORMAL LOW (ref 4.22–5.81)
RDW: 14.2 % (ref 11.5–15.5)
WBC: 9 10*3/uL (ref 4.0–10.5)
nRBC: 0 % (ref 0.0–0.2)

## 2023-05-28 LAB — PHOSPHORUS: Phosphorus: 2.8 mg/dL (ref 2.5–4.6)

## 2023-05-28 LAB — POTASSIUM: Potassium: 4.6 mmol/L (ref 3.5–5.1)

## 2023-05-28 MED ORDER — PREDNISONE 20 MG PO TABS: 40.0000 mg | ORAL_TABLET | Freq: Every day | ORAL | 0 refills | Status: AC

## 2023-05-28 MED ORDER — CEFDINIR 300 MG PO CAPS
300.0000 mg | ORAL_CAPSULE | Freq: Two times a day (BID) | ORAL | 0 refills | Status: AC
Start: 2023-05-28 — End: 2023-06-02

## 2023-05-28 NOTE — Care Management Important Message (Signed)
Important Message  Patient Details  Name: [name] MRN: [id number] Date of Birth: 25-Jan-1951   Medicare Important Message Given:  Yes     [name] Stefan Church 05/28/2023, 2:37 PM

## 2023-05-28 NOTE — Care Management Important Message (Signed)
Important Message  Patient Details  Name: [name] MRN: [id number] Date of Birth: 27-Jun-1951   Medicare Important Message Given:  Yes Patient left prior to IM delivery will mail a copy to the patient home address.     Sharlize Hoar 05/28/2023, 2:30 PM

## 2023-05-28 NOTE — Discharge Summary (Signed)
Physician Discharge Summary  [name] ZDG:[id number] DOB: 12/29/1950 DOA: 05/26/2023  PCP: Jose Hides, PA  Admit date: 05/26/2023  Discharge date: 05/28/2023  Admitted From: Home.  Disposition:  Home.  Recommendations for Outpatient Follow-up:  Follow up with PCP in 1-2 weeks. Please obtain BMP/CBC in one week. Advised to take Omnicef 300 mg twice daily for 5 days for community-acquired pneumonia. Advised to take prednisone 40 mg daily for 4 days for COPD exacerbation Continue supplemental oxygen as directed.  Home Health: None Equipment/Devices:Home oxygen @ 3l/min  Discharge Condition: Stable CODE STATUS:Full code Diet recommendation: Heart Healthy   Brief [name] Course: This 72 years old male with PMH significant for COPD on 2 L of supplemental oxygen as needed at home, A-fib on Eliquis, diastolic CHF, lung cancer in remission, non-Hodgkin's lymphoma in remission and clean cardiac cath 2 months ago presented in the ED with complaints of not able to breathe.  Patient started wheezing this morning which has progressively gotten worse. Patient denies any fever or chills. Patient was hospitalized less than a month ago for pneumonia.  He was found to be hypoxic in the ED requiring up to 6 L of oxygen to maintain saturation above 92%. Patient was treated in the ED as COPD exacerbation,  Chest x-ray concerning for pneumonia.  Patient was admitted and started on IV antibiotics for suspected pneumonia.  Patient was continued on IV antibiotics, IV steroids and supplemental oxygen.  Patient has made significant improvement. His steroids were  changed to prednisone 40 mg daily for 5 days.  Patient feels much improved and wants to be discharged.  Patient being discharged home on Omnicef 300 mg twice daily for 5 days for community-acquired pneumonia and prednisone 40 mg daily for 5 days. Patient being discharged home.  Discharge Diagnoses:  Active Problems:   COPD with acute  exacerbation (HCC)  Acute on chronic hypoxic respiratory failure: COPD exacerbation : Likely secondary to COPD exacerbation and pneumonia. Patient presented with worsening shortness of breath. He was significantly hypoxic requiring up to 6 L of oxygen. Continue supplemental oxygen and wean as tolerated. Continue IV ceftriaxone and Zithromax. Continue with scheduled and as needed bronchodilators. Continue prednisone 40 mg daily x 5 days. Patient admits significant improvement.  He is back to his baseline oxygen requirement 3 L/min.   Community-acquired pneumonia. Initiated on azithromycin and ceftriaxone. Patient being discharged on Omnicef 300 mg twice daily for 5 days.   Atrial Fibrillation: Continue Eliquis, heart rate controlled.  Continue metoprolol.   Essential hypertension Continue amlodipine, metoprolol.   Hyperlipidemia Continue Crestor.   Hypothyroidism: Continue levothyroxine.  Discharge Instructions  Discharge Instructions     Call MD for:  difficulty breathing, headache or visual disturbances   Complete by: As directed    Call MD for:  persistant dizziness or light-headedness   Complete by: As directed    Call MD for:  persistant nausea and vomiting   Complete by: As directed    Diet - low sodium heart healthy   Complete by: As directed    Diet Carb Modified   Complete by: As directed    Discharge instructions   Complete by: As directed    Advised to follow-up with primary care physician in 1 week. Advised to take Omnicef 300 mg twice daily for 5 days for community-acquired pneumonia. Continue supplemental oxygen as directed.   Increase activity slowly   Complete by: As directed       Allergies as of 05/28/2023  Reactions   Lipitor [atorvastatin] Other (See Comments)   Myalgias  Weakness  Fatigue    Omnipaque [iohexol] Itching, Other (See Comments)   When asked about contrast media today, pt stated that last time he had the contrast that he  was red all over and itchy from head to toe.         Medication List     STOP taking these medications    doxycycline 100 MG capsule Commonly known as: VIBRAMYCIN       TAKE these medications    allopurinol 300 MG tablet Commonly known as: ZYLOPRIM Take 300 mg by mouth daily.   amLODipine 5 MG tablet Commonly known as: NORVASC Take 5 mg by mouth daily.   apixaban 5 MG Tabs tablet Commonly known as: ELIQUIS Take 1 tablet (5 mg total) by mouth 2 (two) times daily.   betamethasone dipropionate 0.05 % lotion Apply 1 application  topically 2 (two) times daily. To legs for psoriasis   Buprenorphine HCl-Naloxone HCl 8-2 MG Film Place 0.5-1 Film under the tongue 2 (two) times daily as needed (back pain).   cefdinir 300 MG capsule Commonly known as: OMNICEF Take 1 capsule (300 mg total) by mouth 2 (two) times daily for 5 days.   ezetimibe 10 MG tablet Commonly known as: ZETIA Take 10 mg by mouth daily.   finasteride 5 MG tablet Commonly known as: PROSCAR Take 5 mg by mouth daily.   furosemide 20 MG tablet Commonly known as: Lasix Take 1 tablet (20 mg total) by mouth daily.   ipratropium-albuterol 0.5-2.5 (3) MG/3ML Soln Commonly known as: DUONEB Take 3 mLs by nebulization every 6 (six) hours as needed.   levothyroxine 175 MCG tablet Commonly known as: SYNTHROID Take 175 mcg by mouth daily.   Linzess 290 MCG Caps capsule Generic drug: linaclotide Take 290 mcg by mouth daily.   lisinopril 10 MG tablet Commonly known as: ZESTRIL Take 1 tablet (10 mg total) by mouth daily.   Magnesium Chloride 64 MG Tabs Take 64 mg by mouth daily in the afternoon.   metoprolol succinate 25 MG 24 hr tablet Commonly known as: Toprol XL Take 1 tablet (25 mg total) by mouth daily.   omeprazole 20 MG capsule Commonly known as: PRILOSEC Take 1 capsule (20 mg total) by mouth 2 (two) times daily.   ondansetron 4 MG disintegrating tablet Commonly known as: ZOFRAN-ODT Take 1  tablet (4 mg total) by mouth every 8 (eight) hours as needed.   predniSONE 20 MG tablet Commonly known as: DELTASONE Take 2 tablets (40 mg total) by mouth daily with breakfast for 4 days. Start taking on: May 29, 2023 What changed:  medication strength how much to take how to take this when to take this additional instructions   ProAir HFA 108 (90 Base) MCG/ACT inhaler Generic drug: albuterol Inhale 2 puffs into the lungs every 6 (six) hours as needed for wheezing or shortness of breath.   rosuvastatin 10 MG tablet Commonly known as: CRESTOR Take 1 tablet (10 mg total) by mouth daily.   Trelegy Ellipta 100-62.5-25 MCG/INH Aepb Generic drug: Fluticasone-Umeclidin-Vilant Inhale 1 puff into the lungs daily.   zolpidem 10 MG tablet Commonly known as: AMBIEN Take 10 mg by mouth at bedtime as needed for sleep.        Follow-up Information     Jose Hides, PA Follow up in 1 week(s).   Specialty: Physician Assistant Contact information: [phone] [place] [address] [phone]  Allergies  Allergen Reactions   Lipitor [Atorvastatin] Other (See Comments)    Myalgias  Weakness  Fatigue    Omnipaque [Iohexol] Itching and Other (See Comments)    When asked about contrast media today, pt stated that last time he had the contrast that he was red all over and itchy from head to toe.     Consultations: None   Procedures/Studies: DG Chest Port 1 View  Result Date: 05/26/2023 CLINICAL DATA:  Shortness of breath. History of COPD. upper lobectomy EXAM: PORTABLE CHEST 1 VIEW COMPARISON:  05/17/2023 FINDINGS: Right chest wall Port-A-Cath with tip in the low SVC. Stable cardiomegaly. Aortic atherosclerotic calcification. Postoperative change of right upper lobectomy. Reticulonodular opacities in the left mid and lower lung. Small bilateral pleural effusions. No pneumothorax. Hyperinflation and chronic bronchitic changes. IMPRESSION: Airway  infection/inflammation and/or aspiration in the left lower lung. Small bilateral pleural effusions. Electronically Signed   By: Minerva Fester M.D.   On: 05/26/2023 22:05   DG Chest 2 View  Result Date: 05/17/2023 CLINICAL DATA:  Pneumonia. EXAM: CHEST - 2 VIEW COMPARISON:  None Available. FINDINGS: The heart size and mediastinal contours are within normal limits. Stable appearance of Port-A-Cath. Stable chronic opacity at the right apex related to prior surgery and treated lung carcinoma. Improved aeration at both lung bases with no significant residual suggestion of acute pneumonia. Stable chronic lung disease. No pulmonary edema, pleural effusions or pneumothorax. Visualized bony structures are unremarkable. IMPRESSION: Improved aeration at both lung bases with no significant residual suggestion of acute pneumonia. Electronically Signed   By: Irish Lack M.D.   On: 05/17/2023 08:43   DG Chest 2 View  Result Date: 05/15/2023 CLINICAL DATA:  Chest pain.  Hypoxia.  History of right lung cancer. EXAM: CHEST - 2 VIEW COMPARISON:  Most recent radiograph 05/06/2023.  PET CT 02/12/2023 FINDINGS: Right chest port with tip in the SVC. Unchanged right upper lobe volume loss and opacity, posttreatment sequela. Stable heart size and mediastinal contours. Bilateral pleural effusions have diminished. Persistent patchy left lung base opacity. Slight improvement in right lung base opacity. No pneumothorax. IMPRESSION: 1. Slight improvement in right lung base opacity from prior exam. Persistent patchy left lung base opacity. This may represent infection or asymmetric edema. 2. Decreased bilateral pleural effusions. 3. Unchanged right upper lobe volume loss and opacity, posttreatment sequela. Electronically Signed   By: Narda Rutherford M.D.   On: 05/15/2023 16:04   DG Chest 2 View  Result Date: 05/06/2023 CLINICAL DATA:  Shortness of breath. EXAM: CHEST - 2 VIEW COMPARISON:  Radiographs 02/12/2023 and 01/23/2023.   CT 01/23/2023. FINDINGS: The frontal examination was repeated. Right IJ Port-A-Cath tip is unchanged, extending to the level of the superior cavoatrial junction. The heart size and mediastinal contours are stable. There is stable chronic right upper lobe consolidation with volume loss attributed to prior radiation therapy for lung cancer. There are new small bilateral pleural effusions with extension of fluid into the fissures, best seen on the lateral view. There are new patchy airspace opacities at both lung bases which could be secondary to bronchopneumonia or asymmetric edema. No evidence of pneumothorax or acute osseous abnormality. IMPRESSION: New small bilateral pleural effusions and patchy bibasilar airspace opacities which could be secondary to bronchopneumonia or asymmetric edema. Electronically Signed   By: Carey Bullocks M.D.   On: 05/06/2023 17:06     Subjective: Patient was seen and examined at bedside.  Overnight events noted.   Patient reports doing much better  and wants to be discharged.   Patient is back to baseline oxygen requirement.  Discharge Exam: Vitals:   05/28/23 0828 05/28/23 0922  BP:  110/70  Pulse:  73  Resp:  18  Temp:  97.9 F (36.6 C)  SpO2: 94% 100%   Vitals:   05/28/23 0237 05/28/23 0453 05/28/23 0828 05/28/23 0922  BP:  (!) 112/57  110/70  Pulse: 66 87  73  Resp: 18 18  18   Temp:  98.4 F (36.9 C)  97.9 F (36.6 C)  TempSrc:    Oral  SpO2: 97% 99% 94% 100%  Weight:      Height:        General: Pt is alert, awake, not in acute distress Cardiovascular: RRR, S1/S2 +, no rubs, no gallops Respiratory: CTA bilaterally, no wheezing, no rhonchi Abdominal: Soft, NT, ND, bowel sounds + Extremities: no edema, no cyanosis    The results of significant diagnostics from this hospitalization (including imaging, microbiology, ancillary and laboratory) are listed below for reference.     Microbiology: Recent Results (from the past 240 hour(s))  SARS  Coronavirus 2 by RT PCR (hospital order, performed in [facility] hospital lab) *cepheid single result test* Anterior Nasal Swab     Status: None   Collection Time: 05/26/23  9:56 PM   Specimen: Anterior Nasal Swab  Result Value Ref Range Status   SARS Coronavirus 2 by RT PCR NEGATIVE NEGATIVE Final    Comment: Performed at [facility], [address], [place], [place] [address]  Blood culture (routine x 2)     Status: None (Preliminary result)   Collection Time: 05/26/23 10:41 PM   Specimen: BLOOD LEFT ARM  Result Value Ref Range Status   Specimen Description BLOOD LEFT ARM  Final   Special Requests   Final    BOTTLES DRAWN AEROBIC AND ANAEROBIC Blood Culture results may not be optimal due to an excessive volume of blood received in culture bottles   Culture   Final    NO GROWTH 2 DAYS Performed at [facility], [address], [place], [place] [address]    Report Status PENDING  Incomplete  Blood culture (routine x 2)     Status: None (Preliminary result)   Collection Time: 05/26/23 10:41 PM   Specimen: BLOOD RIGHT HAND  Result Value Ref Range Status   Specimen Description BLOOD RIGHT HAND  Final   Special Requests   Final    BOTTLES DRAWN AEROBIC AND ANAEROBIC Blood Culture adequate volume   Culture   Final    NO GROWTH 2 DAYS Performed at [facility], [address], [place], [place] [address]    Report Status PENDING  Incomplete     Labs: BNP (last 3 results) Recent Labs    01/23/23 1329 05/06/23 1603 05/26/23 2121  BNP 169.8* 440.2* 319.7*   Basic Metabolic Panel: Recent Labs  Lab 05/25/23 0800 05/26/23 2121 05/26/23 2142 05/27/23 0905 05/27/23 1359 05/28/23 0810 05/28/23 0830  NA 139 138 137 133*  --  138  --   K 5.0 5.3* 5.1 5.2* 5.6* 4.6 4.6  CL 100 99  --  98  --  103  --   CO2 28 29  --  27  --  29  --   GLUCOSE 97 136*  --  199*  --  136*  --   BUN 29* 38*  --  32*  --  28*  --  CREATININE 1.47* 1.81*  --  1.28*  --  1.00  --    CALCIUM 9.5 9.2  --  9.0  --  9.0  --   MG  --  1.7  --  2.1  --  2.0  --   PHOS  --   --   --   --   --  2.8  --    Liver Function Tests: Recent Labs  Lab 05/26/23 2121  AST 16  ALT 15  ALKPHOS 71  BILITOT 0.2*  PROT 6.2*  ALBUMIN 3.1*   No results for input(s): "LIPASE", "AMYLASE" in the last 168 hours. No results for input(s): "AMMONIA" in the last 168 hours. CBC: Recent Labs  Lab 05/26/23 2121 05/26/23 2142 05/27/23 0905 05/28/23 0810  WBC 8.7  --  6.6 9.0  NEUTROABS 5.9  --   --   --   HGB 9.9* 10.5* 9.9* 9.7*  HCT 32.2* 31.0* 32.3* 32.1*  MCV 91.2  --  88.3 90.4  PLT 192  --  199 207   Cardiac Enzymes: No results for input(s): "CKTOTAL", "CKMB", "CKMBINDEX", "TROPONINI" in the last 168 hours. BNP: Invalid input(s): "POCBNP" CBG: No results for input(s): "GLUCAP" in the last 168 hours. D-Dimer No results for input(s): "DDIMER" in the last 72 hours. Hgb A1c No results for input(s): "HGBA1C" in the last 72 hours. Lipid Profile No results for input(s): "CHOL", "HDL", "LDLCALC", "TRIG", "CHOLHDL", "LDLDIRECT" in the last 72 hours. Thyroid function studies Recent Labs    05/27/23 0905  TSH 0.237*   Anemia work up No results for input(s): "VITAMINB12", "FOLATE", "FERRITIN", "TIBC", "IRON", "RETICCTPCT" in the last 72 hours. Urinalysis    Component Value Date/Time   COLORURINE YELLOW 02/12/2023 1002   APPEARANCEUR CLEAR 02/12/2023 1002   LABSPEC 1.013 02/12/2023 1002   PHURINE 5.0 02/12/2023 1002   GLUCOSEU NEGATIVE 02/12/2023 1002   GLUCOSEU NEGATIVE 12/03/2011 1024   HGBUR NEGATIVE 02/12/2023 1002   BILIRUBINUR NEGATIVE 02/12/2023 1002   KETONESUR NEGATIVE 02/12/2023 1002   PROTEINUR NEGATIVE 02/12/2023 1002   UROBILINOGEN 0.2 12/03/2011 1024   NITRITE NEGATIVE 02/12/2023 1002   LEUKOCYTESUR NEGATIVE 02/12/2023 1002   Sepsis Labs Recent Labs  Lab 05/26/23 2121 05/27/23 0905 05/28/23 0810  WBC 8.7 6.6 9.0   Microbiology Recent Results  (from the past 240 hour(s))  SARS Coronavirus 2 by RT PCR (hospital order, performed in [facility] hospital lab) *cepheid single result test* Anterior Nasal Swab     Status: None   Collection Time: 05/26/23  9:56 PM   Specimen: Anterior Nasal Swab  Result Value Ref Range Status   SARS Coronavirus 2 by RT PCR NEGATIVE NEGATIVE Final    Comment: Performed at [facility], [address], [place], [place] [address]  Blood culture (routine x 2)     Status: None (Preliminary result)   Collection Time: 05/26/23 10:41 PM   Specimen: BLOOD LEFT ARM  Result Value Ref Range Status   Specimen Description BLOOD LEFT ARM  Final   Special Requests   Final    BOTTLES DRAWN AEROBIC AND ANAEROBIC Blood Culture results may not be optimal due to an excessive volume of blood received in culture bottles   Culture   Final    NO GROWTH 2 DAYS Performed at [facility], [address] [place], [place] [address]    Report Status PENDING  Incomplete  Blood culture (routine x 2)     Status: None (Preliminary result)  Collection Time: 05/26/23 10:41 PM   Specimen: BLOOD RIGHT HAND  Result Value Ref Range Status   Specimen Description BLOOD RIGHT HAND  Final   Special Requests   Final    BOTTLES DRAWN AEROBIC AND ANAEROBIC Blood Culture adequate volume   Culture   Final    NO GROWTH 2 DAYS Performed at [name], [address], [place], [place] [address]    Report Status PENDING  Incomplete     Time coordinating discharge: Over 30 minutes  SIGNED:   Willeen Niece, MD  [facility] 05/28/2023, 12:42 PM Pager   If 7PM-7AM, please contact night-coverage [email or web address]

## 2023-05-31 LAB — CULTURE, BLOOD (ROUTINE X 2)
Culture: NO GROWTH
Culture: NO GROWTH
Special Requests: ADEQUATE

## 2023-06-22 ENCOUNTER — Telehealth: Payer: Self-pay

## 2023-06-22 ENCOUNTER — Ambulatory Visit (INDEPENDENT_AMBULATORY_CARE_PROVIDER_SITE_OTHER): Payer: PPO | Admitting: Gastroenterology

## 2023-06-22 ENCOUNTER — Encounter: Payer: Self-pay | Admitting: Gastroenterology

## 2023-06-22 ENCOUNTER — Other Ambulatory Visit (INDEPENDENT_AMBULATORY_CARE_PROVIDER_SITE_OTHER): Payer: PPO

## 2023-06-22 VITALS — BP 118/80 | HR 83 | Ht 72.0 in | Wt 202.0 lb

## 2023-06-22 DIAGNOSIS — D649 Anemia, unspecified: Secondary | ICD-10-CM

## 2023-06-22 DIAGNOSIS — K5909 Other constipation: Secondary | ICD-10-CM

## 2023-06-22 DIAGNOSIS — R933 Abnormal findings on diagnostic imaging of other parts of digestive tract: Secondary | ICD-10-CM | POA: Diagnosis not present

## 2023-06-22 DIAGNOSIS — Z9981 Dependence on supplemental oxygen: Secondary | ICD-10-CM | POA: Diagnosis not present

## 2023-06-22 DIAGNOSIS — Z7901 Long term (current) use of anticoagulants: Secondary | ICD-10-CM

## 2023-06-22 DIAGNOSIS — J449 Chronic obstructive pulmonary disease, unspecified: Secondary | ICD-10-CM | POA: Diagnosis not present

## 2023-06-22 LAB — CBC WITH DIFFERENTIAL/PLATELET
Basophils Absolute: 0 10*3/uL (ref 0.0–0.1)
Basophils Relative: 0.3 % (ref 0.0–3.0)
Eosinophils Absolute: 0.1 10*3/uL (ref 0.0–0.7)
Eosinophils Relative: 1.2 % (ref 0.0–5.0)
HCT: 36.5 % — ABNORMAL LOW (ref 39.0–52.0)
Hemoglobin: 11.4 g/dL — ABNORMAL LOW (ref 13.0–17.0)
Lymphocytes Relative: 17.1 % (ref 12.0–46.0)
Lymphs Abs: 1.1 10*3/uL (ref 0.7–4.0)
MCHC: 31.3 g/dL (ref 30.0–36.0)
MCV: 88.3 fL (ref 78.0–100.0)
Monocytes Absolute: 0.6 10*3/uL (ref 0.1–1.0)
Monocytes Relative: 9.3 % (ref 3.0–12.0)
Neutro Abs: 4.4 10*3/uL (ref 1.4–7.7)
Neutrophils Relative %: 72.1 % (ref 43.0–77.0)
Platelets: 243 10*3/uL (ref 150.0–400.0)
RBC: 4.13 Mil/uL — ABNORMAL LOW (ref 4.22–5.81)
RDW: 17.6 % — ABNORMAL HIGH (ref 11.5–15.5)
WBC: 6.1 10*3/uL (ref 4.0–10.5)

## 2023-06-22 LAB — IBC + FERRITIN
Ferritin: 65.5 ng/mL (ref 22.0–322.0)
Iron: 59 ug/dL (ref 42–165)
Saturation Ratios: 15.1 % — ABNORMAL LOW (ref 20.0–50.0)
TIBC: 390.6 ug/dL (ref 250.0–450.0)
Transferrin: 279 mg/dL (ref 212.0–360.0)

## 2023-06-22 MED ORDER — PLENVU 140 G PO SOLR: 1.0000 | Freq: Once | ORAL | 0 refills | Status: AC

## 2023-06-22 MED ORDER — MOTEGRITY 2 MG PO TABS
2.0000 mg | ORAL_TABLET | Freq: Every day | ORAL | Status: AC
Start: 2023-06-22 — End: 2023-09-20

## 2023-06-22 NOTE — Progress Notes (Signed)
HPI :  Discussed the use of AI scribe software for clinical note transcription with the patient, who gave verbal consent to proceed.  72 years old male with PMH significant for COPD on 2 L of supplemental oxygen as needed at home, A-fib on Eliquis, diastolic CHF, lung cancer in remission, non-Hodgkin's lymphoma in remission, here for a new patient visit for abnormal PET scan.  Referred by Laurence Aly, PA.  This is the patient's first time to our office.  The patient reports that his malignancies are in remission currently.  He had a PET scan done this past May which showed some focal uptake in the cecum that was nonspecific, inflammatory versus potentially adenomatous/malignant. The patient reports a longstanding issue with constipation, which he has attributed to inadequate water intake. The constipation is severe enough to necessitate the use of enemas historically and is currently managed with daily Linzess 270 mcg, which initially provided relief but has become less effective over time.  He states if he increases his water intake and uses prune juice in addition to this his bowels will move regularly.  Cardiology has cut back on his water intake recently.  The patient also reports occasional rectal bleeding, which he attributes to hemorrhoids and only occurs during episodes of constipation.  He denies any abdominal pains or weight loss.  The patient's last colonoscopy was approximately seven to eight years ago, he was told there was no need for a follow-up for ten years.  This is done by Dr. Charm Barges in [place], no records on file today.  He denies any family history of colon cancer. The patient's lung cancer and lymphoma are currently in remission, and he was recently hospitalized for pneumonia in early September.  He had COPD exacerbation as well as pneumonia.  Treated with antibiotics.  He states he has significant recovered at this time and denies any cardiopulmonary symptoms.. He uses oxygen  overnight at baseline but not during the day.  The patient is on Eliquis for atrial fibrillation and had a cardiac catheterization over the summer, which showed no significant coronary artery disease, EF was okay.  He has been on chronic pain medicines in the past but has since stopped regular use of this.  Does not use his buprenorphine naloxone strips frequently, only once every 4 days or so.  He is otherwise been noted to have a history of anemia, hemoglobin has ranged from 9-12's.  He had normal iron studies back in May of this past year.  During his hospitalization last month his hemoglobin was in the nines.  Previously had a normal B12 level as well  Prior workup: CT abdomen / pelvis 07/10/2022: IMPRESSION: Nonobstructive bilateral nephrolithiasis. No acute abnormality seen in the abdomen or pelvis. Aortic Atherosclerosis (ICD10-I70.0).   PET scan 02/16/23: IMPRESSION: 1. No evidence of local recurrence of right upper lobe lung cancer or metastatic disease. No suspicious metabolic activity within the right apical consolidation which is attributed to prior radiation therapy. 2. New focal hypermetabolic activity along the medial wall of the cecum near the ileocecal valve. This could be secondary to inflammation or a villous adenoma. Consider further evaluation with colonoscopy if not recently performed. 3. Aortic Atherosclerosis (ICD10-I70.0) and Emphysema (ICD10-J43.9).    Echo 02/17/23: EF 60-65%, moderate LVH  Cardiac cath 04/12/23:   Angiographically normal coronary arteries; codominant system   The left ventricular systolic function is normal. The left ventricular ejection fraction is greater than 65% by visual estimate.   LV end diastolic pressure  is severely elevated.   There is no aortic valve stenosis.   Past Medical History:  Diagnosis Date   Adjustment disorder with other symptom 11/27/2015   AKI (acute kidney injury) (HCC) 01/14/2016   Aortic atherosclerosis  (HCC) 01/26/2023   Arthritis    Atrial fibrillation (HCC) 10/26/2011   BPH (benign prostatic hyperplasia) 2011   BRONCHOPNEUMONIA ORGANISM UNSPECIFIED 06/12/2010         Replacing diagnoses that were inactivated after the 12/21/22 regulatory import   Carpal tunnel syndrome, right upper limb 11/03/2022   Chronic respiratory failure with hypoxia (HCC) 08/27/2015   Congestive heart failure (HCC)    COPD (chronic obstructive pulmonary disease) (HCC)    COPD, severe (HCC) 11/27/2015   12/18/2015-pulmonary function test- FVC 2.75 (55% predicted), postbronchodilator ratio 56, postbronchodilator FEV1 1.66 (45% predicted), no significant bronchodilator response, mid flow reversibility, DLCO 33  >>>Severe obstructive airways disease, Severe diffusion defect      Coronary artery calcification 01/26/2023   Depression    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 [facility])    Drug-induced neutropenia (HCC) 10/08/2015   Encounter for antineoplastic chemotherapy 10/08/2015   Essential hypertension 06/05/2008   Qualifier: Diagnosis of   By: Truman Hayward Duncan Dull), [name]       Gastrointestinal obstruction (HCC)    Ileus   GERD (gastroesophageal reflux disease)    Gout attack 08/29/2012   Hx of colonoscopy    Hypothyroidism 03/30/2012   Lesion of ulnar nerve, right upper limb 11/03/2022   Lung cancer (HCC) 2011   Lymphadenopathy syndrome 09/12/2015   Mediastinal adenopathy 09/12/2015   NHL (non-Hodgkin's lymphoma) (HCC) 09/25/2015   Numbness of right hand 06/28/2022   Obesity    Open wound of right hand without foreign body 07/27/2021   Other dysphagia 11/11/2010   Qualifier: Diagnosis of   By: Marchelle Gearing MD, [name]       Polycythemia 01/26/2023   POLYCYTHEMIA 06/05/2008   Qualifier: Diagnosis of   By: Truman Hayward Duncan Dull), [name]       Port catheter in place 08/20/2016   Post-nasal drip 10/04/2011   Primary cancer of right upper lobe of lung (HCC) 10/25/2019   Squamous cell    Pulmonary hypertension (HCC)    Pulmonary nodule, right 10/03/2019   Pure hypercholesterolemia 12/03/2011   Recurrent pneumonia 02/10/2012   Routine general medical examination at a health care facility 12/03/2011   Sepsis (HCC) 01/14/2016   Severe sepsis (HCC) 01/14/2016   Squamous cell carcinoma lung (HCC) 09/26/2010   S/p LUL resection Dr [name] dec 2011      Status post bronchoscopy with biopsy    Stiffness of finger joint of right hand 09/24/2021   Streptococcal pneumonia (HCC) 04/2008   Testicular swelling 09/12/2015     Past Surgical History:  Procedure Laterality Date   APPENDECTOMY     BACK SURGERY     CARDIOVERSION  11/27/2011   Procedure: CARDIOVERSION;  Surgeon: Vesta Mixer, MD;  Location: [facility] ENDOSCOPY;  Service: Cardiovascular;  Laterality: N/A;   COLONOSCOPY     FUDUCIAL PLACEMENT Right 10/13/2019   Procedure: Placement Of Fuducial;  Surgeon: Leslye Peer, MD;  Location: [facility] OR;  Service: Thoracic;  Laterality: Right;  Right Upper Lobe    LEFT HEART CATH AND CORONARY ANGIOGRAPHY N/A 04/12/2023   Procedure: LEFT HEART CATH AND CORONARY ANGIOGRAPHY;  Surgeon: Marykay Lex, MD;  Location: [facility] INVASIVE CV LAB;  Service: Cardiovascular;  Laterality: N/A;   LUNG CANCER SURGERY  2011  UPPER GI ENDOSCOPY     VIDEO BRONCHOSCOPY WITH ENDOBRONCHIAL NAVIGATION N/A 10/13/2019   Procedure: VIDEO BRONCHOSCOPY WITH ENDOBRONCHIAL NAVIGATION;  Surgeon: Leslye Peer, MD;  Location: [facility] OR;  Service: Thoracic;  Laterality: N/A;   VIDEO BRONCHOSCOPY WITH ENDOBRONCHIAL ULTRASOUND N/A 10/13/2019   Procedure: VIDEO BRONCHOSCOPY WITH ENDOBRONCHIAL ULTRASOUND;  Surgeon: Leslye Peer, MD;  Location: [facility] OR;  Service: Thoracic;  Laterality: N/A;   Family History  Problem Relation Age of Onset   Heart disease Father    Cancer Neg Hx    Diabetes Neg Hx    Stroke Neg Hx    Hypertension Neg Hx    Kidney disease Neg Hx    Social History   Tobacco Use   Smoking status: Former     Current packs/day: 0.00    Average packs/day: 3.0 packs/day for 43.0 years (129.0 ttl pk-yrs)    Types: Cigarettes    Start date: 10/22/1958    Quit date: 10/22/2001    Years since quitting: 21.6   Smokeless tobacco: Never  Vaping Use   Vaping status: Never Used  Substance Use Topics   Alcohol use: No   Drug use: No   Current Outpatient Medications  Medication Sig Dispense Refill   allopurinol (ZYLOPRIM) 300 MG tablet Take 300 mg by mouth daily.     amLODipine (NORVASC) 5 MG tablet Take 5 mg by mouth daily.     apixaban (ELIQUIS) 5 MG TABS tablet Take 1 tablet (5 mg total) by mouth 2 (two) times daily. 180 tablet 3   betamethasone dipropionate 0.05 % lotion Apply 1 application  topically 2 (two) times daily. To legs for psoriasis     Buprenorphine HCl-Naloxone HCl 8-2 MG FILM Place 0.5-1 Film under the tongue 2 (two) times daily as needed (back pain).     ezetimibe (ZETIA) 10 MG tablet Take 10 mg by mouth daily.     finasteride (PROSCAR) 5 MG tablet Take 5 mg by mouth daily.     furosemide (LASIX) 20 MG tablet Take 1 tablet (20 mg total) by mouth daily. 30 tablet 11   ipratropium-albuterol (DUONEB) 0.5-2.5 (3) MG/3ML SOLN Take 3 mLs by nebulization every 6 (six) hours as needed. 360 mL 1   levothyroxine (SYNTHROID) 175 MCG tablet Take 175 mcg by mouth daily.     LINZESS 290 MCG CAPS capsule Take 290 mcg by mouth daily.     lisinopril (PRINIVIL,ZESTRIL) 10 MG tablet Take 1 tablet (10 mg total) by mouth daily. 30 tablet 11   Magnesium Chloride 64 MG TABS Take 64 mg by mouth daily in the afternoon. 90 tablet 3   metoprolol succinate (TOPROL XL) 25 MG 24 hr tablet Take 1 tablet (25 mg total) by mouth daily. 90 tablet 3   omeprazole (PRILOSEC) 20 MG capsule Take 1 capsule (20 mg total) by mouth 2 (two) times daily.     ondansetron (ZOFRAN-ODT) 4 MG disintegrating tablet Take 1 tablet (4 mg total) by mouth every 8 (eight) hours as needed. 20 tablet 0   PROAIR HFA 108 (90 Base) MCG/ACT inhaler  Inhale 2 puffs into the lungs every 6 (six) hours as needed for wheezing or shortness of breath. 3 Inhaler 1   TRELEGY ELLIPTA 100-62.5-25 MCG/INH AEPB Inhale 1 puff into the lungs daily.     zolpidem (AMBIEN) 10 MG tablet Take 10 mg by mouth at bedtime as needed for sleep.     rosuvastatin (CRESTOR) 10 MG tablet Take 1 tablet (10 mg total)  by mouth daily. (Patient not taking: Reported on 06/22/2023) 90 tablet 3   No current facility-administered medications for this visit.   Allergies  Allergen Reactions   Lipitor [Atorvastatin] Other (See Comments)    Myalgias  Weakness  Fatigue    Omnipaque [Iohexol] Itching and Other (See Comments)    When asked about contrast media today, pt stated that last time he had the contrast that he was red all over and itchy from head to toe.      Review of Systems: All systems reviewed and negative except where noted in HPI.   Lab Results  Component Value Date   WBC 9.0 05/28/2023   HGB 9.7 (L) 05/28/2023   HCT 32.1 (L) 05/28/2023   MCV 90.4 05/28/2023   PLT 207 05/28/2023    Lab Results  Component Value Date   NA 138 05/28/2023   CL 103 05/28/2023   K 4.6 05/28/2023   CO2 29 05/28/2023   BUN 28 (H) 05/28/2023   CREATININE 1.00 05/28/2023   GFRNONAA >60 05/28/2023   CALCIUM 9.0 05/28/2023   PHOS 2.8 05/28/2023   ALBUMIN 3.1 (L) 05/26/2023   GLUCOSE 136 (H) 05/28/2023    Lab Results  Component Value Date   ALT 15 05/26/2023   AST 16 05/26/2023   ALKPHOS 71 05/26/2023   BILITOT 0.2 (L) 05/26/2023   Lab Results  Component Value Date   IRON 51 02/14/2023   TIBC 311 02/14/2023   FERRITIN 174 02/14/2023     Physical Exam: BP 118/80   Pulse 83   Ht 6' (1.829 m)   Wt 202 lb (91.6 kg)   BMI 27.40 kg/m  Constitutional: Pleasant, male in no acute distress. HEENT: Normocephalic and atraumatic. Conjunctivae are normal. No scleral icterus. Neck supple.  Cardiovascular: Normal rate, regular rhythm.  Pulmonary/chest: Effort normal  and breath sounds normal. No wheezing, rales or rhonchi. Abdominal: Soft, nondistended, nontender. There are no masses palpable. No hepatomegaly. Extremities: no edema Lymphadenopathy: No cervical adenopathy noted. Neurological: Alert and oriented to person place and time. Skin: Skin is warm and dry. No rashes noted. Psychiatric: Normal mood and affect. Behavior is normal.   ASSESSMENT: 72 y.o. male here for assessment of the following  1. Abnormal finding on GI tract imaging   2. Chronic constipation   3. Dependence on nocturnal oxygen therapy   4. Chronic obstructive pulmonary disease, unspecified COPD type (HCC)   5. Anticoagulated   6. Anemia, unspecified type    Abnormal PET scan Nonspecific uptake in the colon was noted on a PET scan from May, although there are no current bowel symptoms suggestive of malignancy. His last colonoscopy was approximately 7-8 years ago. We will schedule a colonoscopy in a hospital setting due to his underlying lung disease and the need for oxygen at night. We will request for Eliquis to be held for 2 days prior to this exam.  Given his chronic constipation a 2-day bowel prep will be recommended to ensure adequate visualization.  Chronic Constipation He has chronic constipation, currently managed with Linzess, prune juice, and increased water intake, though Linzess has been less effective recently.  I provided some samples of Motegrity for him today to see if that works better for him, if so we will give him prescription.  If not he will go back to his Linzess 290 mcg/day and can add MiraLAX to the regimen as needed  Anemia He has a history of anemia, which worsened during a recent hospitalization. Blood work  will be ordered to check current blood counts and iron levels.  PLAN: - colonoscopy at [facility] in December - double prep for his exam given chronic constipation - will need approval to hold Eliquis for 2 days prior to the exam - trial of motegrity  2mg  / day for constipation, will hold Linzess while on it - if motegrity does not help, go back to using linzess - can add Miralax / daily PRN and titrate up nedded - repeat CBC today with TIBC / ferritin panel  Harlin Rain, MD [facility]  CC: [name], PA

## 2023-06-22 NOTE — Telephone Encounter (Signed)
Patient with diagnosis of afib on Eliquis for anticoagulation.    Procedure: colonoscopy Date of procedure: 09/07/23  CHA2DS2-VASc Score = 3  This indicates a 3.2% annual risk of stroke. The patient's score is based upon: CHF History: 1 HTN History: 1 Diabetes History: 0 Stroke History: 0 Vascular Disease History: 0 Age Score: 1 Gender Score: 0  CrCl 72mL/min Platelet count 207K  Per office protocol, patient can hold Eliquis for 2 days prior to procedure as requested.    **This guidance is not considered finalized until pre-operative APP has relayed final recommendations.**

## 2023-06-22 NOTE — Patient Instructions (Addendum)
You have been scheduled for a colonoscopy. Please follow written instructions given to you at your visit today.   Please pick up your prep supplies at the pharmacy within the next 1-3 days.  If you use inhalers (even only as needed), please bring them with you on the day of your procedure.  DO NOT TAKE 7 DAYS PRIOR TO TEST- Trulicity (dulaglutide) Ozempic, Wegovy (semaglutide) Mounjaro (tirzepatide) Bydureon Bcise (exanatide extended release)  DO NOT TAKE 1 DAY PRIOR TO YOUR TEST Rybelsus (semaglutide) Adlyxin (lixisenatide) Victoza (liraglutide) Byetta (exanatide) ___________________________________________________________________________  We have given you samples of the following medication to take: Motegrity: Take once daily (hold Linzess while taking).   If not effective, go back to Linzess 290 mcg once daily and add Miralax once to twice daily as needed.  Please go to the lab in the basement of our building to have lab work done as you leave today. Hit "B" for basement when you get on the elevator.  When the doors open the lab is on your left.  We will call you with the results. Thank you.  Thank you for entrusting me with your care and for choosing [facility], Dr. Ileene Patrick        If your blood pressure at your visit was 140/90 or greater, please contact your primary care physician to follow up on this. ______________________________________________________  If you are age 35 or older, your body mass index should be between 23-30. Your Body mass index is 27.4 kg/m. If this is out of the aforementioned range listed, please consider follow up with your Primary Care Provider.  If you are age 27 or younger, your body mass index should be between 19-25. Your Body mass index is 27.4 kg/m. If this is out of the aformentioned range listed, please consider follow up with your Primary Care Provider.   ________________________________________________________  The [facility] providers would like to encourage you to use [facility] to communicate with providers for non-urgent requests or questions.  Due to long hold times on the telephone, sending your provider a message by [facility] may be a faster and more efficient way to get a response.  Please allow 48 business hours for a response.  Please remember that this is for non-urgent requests.  _______________________________________________________  Due to recent changes in healthcare laws, you may see the results of your imaging and laboratory studies on MyChart before your provider has had a chance to review them.  We understand that in some cases there may be results that are confusing or concerning to you. Not all laboratory results come back in the same time frame and the provider may be waiting for multiple results in order to interpret others.  Please give [place] 48 hours in order for your provider to thoroughly review all the results before contacting the office for clarification of your results.

## 2023-06-22 NOTE — Telephone Encounter (Signed)
Pharmacy please advise on holding Eliquis prior to colonoscopy scheduled for 09/07/2023. Thank you.

## 2023-06-22 NOTE — Telephone Encounter (Signed)
[facility] Pre-operative Risk Assessment     Request for surgical clearance:     Endoscopy Procedure  What type of surgery is being performed?     COLONOSCOPY  When is this surgery scheduled?     09-07-23  What type of clearance is required ?   Pharmacy  Are there any medications that need to be held prior to surgery and how long? ELIQUIS 2 DAYS  Practice name and name of physician performing surgery?      [facility] DR Adela Lank  What is your office phone and fax number?      Phone- [phone]  Fax- [phone] Delma Officer, CMA  Anesthesia type (None, local, MAC, general) ?       MAC   THANK YOU

## 2023-06-22 NOTE — Telephone Encounter (Signed)
Patient Name: [name]  DOB: July 26, 1951 MRN: [id number]  Primary Cardiologist: None  Clinical pharmacists have reviewed the patient's past medical history, labs, and current medications as part of preoperative protocol coverage. The following recommendations have been made:  Per office protocol, patient can hold Eliquis for 2 days prior to procedure as requested   I will route this recommendation to the requesting party via Epic fax function and remove from pre-op pool.  Please call with questions.  Napoleon Form, Leodis Rains, NP 06/22/2023, 11:14 AM

## 2023-06-28 NOTE — Telephone Encounter (Signed)
Called patient. LM to call back to discuss holding Eliquis for procedure in December

## 2023-06-29 NOTE — Telephone Encounter (Signed)
Called and spoke to patient.  He understands to hold Eliquis on Dec 15th and 16th for procedure on Dec 17

## 2023-07-06 ENCOUNTER — Telehealth: Payer: Self-pay | Admitting: Internal Medicine

## 2023-07-08 ENCOUNTER — Inpatient Hospital Stay: Payer: PPO

## 2023-07-08 ENCOUNTER — Inpatient Hospital Stay: Payer: PPO | Attending: Internal Medicine

## 2023-07-08 DIAGNOSIS — Z452 Encounter for adjustment and management of vascular access device: Secondary | ICD-10-CM | POA: Diagnosis present

## 2023-07-08 DIAGNOSIS — C3411 Malignant neoplasm of upper lobe, right bronchus or lung: Secondary | ICD-10-CM | POA: Diagnosis not present

## 2023-07-08 DIAGNOSIS — Z95828 Presence of other vascular implants and grafts: Secondary | ICD-10-CM

## 2023-07-08 MED ORDER — SODIUM CHLORIDE 0.9% FLUSH
10.0000 mL | INTRAVENOUS | Status: DC | PRN
  Administered 2023-07-08: 10 mL via INTRAVENOUS

## 2023-07-08 MED ORDER — HEPARIN SOD (PORK) LOCK FLUSH 100 UNIT/ML IV SOLN
500.0000 [IU] | Freq: Once | INTRAVENOUS | Status: AC | PRN
  Administered 2023-07-08: 500 [IU] via INTRAVENOUS

## 2023-07-09 DIAGNOSIS — G5621 Lesion of ulnar nerve, right upper limb: Secondary | ICD-10-CM | POA: Insufficient documentation

## 2023-07-09 HISTORY — DX: Lesion of ulnar nerve, right upper limb: G56.21

## 2023-07-12 ENCOUNTER — Encounter: Payer: Self-pay | Admitting: Internal Medicine

## 2023-07-12 ENCOUNTER — Ambulatory Visit (INDEPENDENT_AMBULATORY_CARE_PROVIDER_SITE_OTHER): Payer: PPO | Admitting: Internal Medicine

## 2023-07-12 VITALS — BP 135/54 | HR 65 | Temp 97.8°F | Ht 72.0 in | Wt 210.6 lb

## 2023-07-12 DIAGNOSIS — J449 Chronic obstructive pulmonary disease, unspecified: Secondary | ICD-10-CM

## 2023-07-12 DIAGNOSIS — R0989 Other specified symptoms and signs involving the circulatory and respiratory systems: Secondary | ICD-10-CM

## 2023-07-12 NOTE — Addendum Note (Signed)
Addended by: Legrand Pitts on: 07/12/2023 10:23 AM   Modules accepted: Orders

## 2023-07-12 NOTE — Patient Instructions (Addendum)
ICD-10-CM   1. COPD, severe (HCC)  J44.9       Noted history of flareup in September 2024 requiring oxygen but currently stable -Glad you are up-to-date with the flu shot -Last breathing test was in 2017   Plan  - continue trelegy  - continue night o2 -Do spirometry and DLCO in 3 months -Continue colonoscopy evaluation December 2024 -Keep appoint with Dr. Arbutus Ped November 2024  -Timing of next CT scan of the chest by Dr. Arbutus Ped but if not we can get 1 in May 2025    Followup 3 months or sooner if needed 15-minute visit

## 2023-07-12 NOTE — Progress Notes (Signed)
OV 07/12/2023  Subjective:  Patient ID: [name], male , DOB: 01-21-1951 , age 72 y.o. , MRN: [id number] , ADDRESS: [address] [place] [address] PCP [name], PA Patient Care Team: [name], [name] as PCP - General (Physician Assistant) Dellia Beckwith, MD as Consulting Physician (Hematology and Oncology)  This Provider for this visit: Treatment Team:  Attending Provider: Kalman Shan, MD    07/12/2023 -   Chief Complaint  Patient presents with   Consult    COPD     HPI [name] 72 y.o. -this is a new consultation but he is previously established with me for many years but have not seen him since over 3 years and therefore the new consult.  He is known to have COPD.  He also history of lung cancer.  He tells me that overall he has been doing well but he is now follows up with cardiology for diastolic heart failure.  Reviewed those records.  Also on September 2024 he got admitted for COPD exacerbation and therefore has been referred here.  He was on oxygen during that time but he is now back to his baseline of using nighttime oxygen.  He continues his daily Trelegy.  He feels good.  He states he is up-to-date with his flu shot.  He has a history of lung cancer he follows with Dr. Arbutus Ped.  He had a scan in May 2024 this resulted in a PET scan and there is no uptake in his right upper lobe which has collapse consolidation.  But there was colonic uptake on the PET scan and he is having a colonoscopy September 07, 2023 per his history.  Of note he has some crackles on the exam and I personally visualized the CT scans from May 2024 and I do not see any evidence of ILD.[name]  The crackles bilateral bibasal.  Review of the labs indicate he has had normal eosinophils  arrative & Impression  CLINICAL DATA:  Pleural effusion; * Tracking Code: BO *   EXAM: CT CHEST WITH CONTRAST   TECHNIQUE: Multidetector CT imaging of the chest was  performed during intravenous contrast administration.   RADIATION DOSE REDUCTION: This exam was performed according to the departmental dose-optimization program which includes automated exposure control, adjustment of the mA and/or kV according to patient size and/or use of iterative reconstruction technique.   CONTRAST:  75mL OMNIPAQUE IOHEXOL 350 MG/ML SOLN   COMPARISON:  Chest CT dated July 07, 2022   FINDINGS: Cardiovascular: Heart size. Pericardial effusion. Normal caliber thoracic aorta with moderate atherosclerotic disease. Dilated main pulmonary artery, measuring up to 3.9 cm. Moderate coronary artery calcifications.   Mediastinum/Nodes: Esophagus is unremarkable. Mildly enlarged pre-vascular lymph node measuring 10 mm on series 2, image 49, unchanged when compared with the prior exam. No enlarging lymph nodes seen in the chest.   Lungs/Pleura: Right upper lobe postradiation change increased right apical consolidation. Mild debris seen in the right mainstem bronchus. Mild bilateral cylindrical bronchiectasis. Innumerable centrilobular nodules are seen in the bilateral lower lobes with more confluent areas of consolidation seen in the left lower lobe. No pleural effusion or pneumothorax. Right pleural calcifications, likely sequela of prior pneumothorax or empyema.   Upper Abdomen: Numerous bilateral low-attenuation renal lesions, largest are compatible with simple cysts, others are too small to accurately characterize, no specific follow-up imaging is recommended. Unchanged solid lesion of the anterior spleen measuring 2.8 cm on series 2, image  179, non FDG avid on prior PET-CT and likely benign.   Musculoskeletal: No chest wall abnormality. No acute or significant osseous findings.   IMPRESSION: 1. Right upper lobe postradiation change with increased adjacent right apical consolidation, likely due to atelectasis. Recommend PET-CT to assess for recurrent  disease. 2. No evidence of metastatic disease in the chest. 3. Mild debris of the right mainstem bronchus and new innumerable centrilobular of the bilateral lower lobes with more confluent areas of consolidation seen in the left lower lobe, findings are likely due to aspiration. Recommend attention on follow-up. 4. Dilated main pulmonary artery, can be seen in the setting of pulmonary hypertension. 5. Coronary artery calcifications and aortic Atherosclerosis (ICD10-I70.0).     Electronically Signed   By: Allegra Lai M.D.   On: 01/23/2023 20:10      Result History   NMPET SCAN  IMPRESSION: 1. No evidence of local recurrence of right upper lobe lung cancer or metastatic disease. No suspicious metabolic activity within the right apical consolidation which is attributed to prior radiation therapy. 2. New focal hypermetabolic activity along the medial wall of the cecum near the ileocecal valve. This could be secondary to inflammation or a villous adenoma. Consider further evaluation with colonoscopy if not recently performed. 3. Aortic Atherosclerosis (ICD10-I70.0) and Emphysema (ICD10-J43.9).     Electronically Signed   By: Carey Bullocks M.D.   On: 02/16/2023 17:07   Simple office walk 224 (66+46 x 2) feet Pod A at [facility] x  3 laps goal with forehead probe 07/12/2023    O2 used ra   Number laps completed Just 1   Comments about pace normal   Resting Pulse Ox/HR 96% and x/min   Final Pulse Ox/HR 94% and x/min   Desaturated </= 88% no   Desaturated <= 3% points no   Got Tachycardic >/= 90/min nx   Symptoms at end of test no   Miscellaneous comments no      PFT     Latest Ref Rng & Units 12/18/2015    9:44 AM  PFT Results  FVC-Pre L 2.75   FVC-Predicted Pre % 55   FVC-Post L 2.99   FVC-Predicted Post % 60   Pre FEV1/FVC % % 56   Post FEV1/FCV % % 56   FEV1-Pre L 1.53   FEV1-Predicted Pre % 41   FEV1-Post L 1.66   DLCO uncorrected ml/min/mmHg 11.85    DLCO UNC% % 33   DLCO corrected ml/min/mmHg 13.96   DLCO COR %Predicted % 39   DLVA Predicted % 65   TLC L 6.25   TLC % Predicted % 84   RV % Predicted % 143      LAB RESULTS last 96 hours No results found.  LAB RESULTS last 90 days Recent Results (from the past 2160 hour(s))  Comprehensive metabolic panel     Status: Abnormal   Collection Time: 05/06/23  4:03 PM  Result Value Ref Range   Sodium 139 135 - 145 mmol/L   Potassium 4.1 3.5 - 5.1 mmol/L   Chloride 99 98 - 111 mmol/L   CO2 29 22 - 32 mmol/L   Glucose, Bld 112 (H) 70 - 99 mg/dL    Comment: Glucose reference range applies only to samples taken after fasting for at least 8 hours.   BUN 19 8 - 23 mg/dL   Creatinine, Ser 3.47 0.61 - 1.24 mg/dL   Calcium 8.5 (L) 8.9 - 10.3 mg/dL   Total Protein  5.4 (L) 6.5 - 8.1 g/dL   Albumin 2.8 (L) 3.5 - 5.0 g/dL   AST 14 (L) 15 - 41 U/L   ALT 15 0 - 44 U/L   Alkaline Phosphatase 69 38 - 126 U/L   Total Bilirubin 0.9 0.3 - 1.2 mg/dL   GFR, Estimated >29 >52 mL/min    Comment: (NOTE) Calculated using the CKD-EPI Creatinine Equation (2021)    Anion gap 11 5 - 15    Comment: Performed at [facility], [address], [place], [place] [address]  Brain natriuretic peptide     Status: Abnormal   Collection Time: 05/06/23  4:03 PM  Result Value Ref Range   B Natriuretic Peptide 440.2 (H) 0.0 - 100.0 pg/mL    Comment: Performed at [facility], [address] [place], [place] [address]  Troponin I (High Sensitivity)     Status: None   Collection Time: 05/06/23  4:03 PM  Result Value Ref Range   Troponin I (High Sensitivity) 8 <18 ng/L    Comment: (NOTE) Elevated high sensitivity troponin I (hsTnI) values and significant  changes across serial measurements may suggest ACS but many other  chronic and acute conditions are known to elevate hsTnI results.  Refer to the "Links" section for chest pain algorithms and additional  guidance. Performed at [facility]  Lab, [address], [place], [place] [address]   CBC with Differential     Status: Abnormal   Collection Time: 05/06/23  4:03 PM  Result Value Ref Range   WBC 8.9 4.0 - 10.5 K/uL   RBC 3.42 (L) 4.22 - 5.81 MIL/uL   Hemoglobin 9.6 (L) 13.0 - 17.0 g/dL   HCT 66.4 (L) 40.3 - 47.4 %   MCV 90.6 80.0 - 100.0 fL   MCH 28.1 26.0 - 34.0 pg   MCHC 31.0 30.0 - 36.0 g/dL   RDW 25.9 56.3 - 87.5 %   Platelets 218 150 - 400 K/uL   nRBC 0.0 0.0 - 0.2 %   Neutrophils Relative % 81 %   Neutro Abs 7.2 1.7 - 7.7 K/uL   Lymphocytes Relative 10 %   Lymphs Abs 0.9 0.7 - 4.0 K/uL   Monocytes Relative 8 %   Monocytes Absolute 0.7 0.1 - 1.0 K/uL   Eosinophils Relative 0 %   Eosinophils Absolute 0.0 0.0 - 0.5 K/uL   Basophils Relative 0 %   Basophils Absolute 0.0 0.0 - 0.1 K/uL   Immature Granulocytes 1 %   Abs Immature Granulocytes 0.04 0.00 - 0.07 K/uL    Comment: Performed at [facility], [address], [place], [place] [address]  SARS Coronavirus 2 by RT PCR (hospital order, performed in [facility] hospital lab) *cepheid single result test* Anterior Nasal Swab     Status: None   Collection Time: 05/06/23  4:03 PM   Specimen: Anterior Nasal Swab  Result Value Ref Range   SARS Coronavirus 2 by RT PCR NEGATIVE NEGATIVE    Comment: Performed at [facility], [address], [place], [place] [address]  Culture, blood (routine x 2)     Status: None   Collection Time: 05/06/23  6:00 PM   Specimen: BLOOD  Result Value Ref Range   Specimen Description BLOOD SITE NOT SPECIFIED    Special Requests      BOTTLES DRAWN AEROBIC AND ANAEROBIC Blood Culture results may not be optimal due to an excessive volume of blood received  in culture bottles   Culture      NO GROWTH 5 DAYS Performed at [facility], [address], [place], [place] [address]    Report Status 05/11/2023 FINAL   Culture, blood (routine x 2)     Status: None   Collection Time: 05/06/23  6:00 PM   Specimen: BLOOD  Result  Value Ref Range   Specimen Description BLOOD SITE NOT SPECIFIED    Special Requests      BOTTLES DRAWN AEROBIC AND ANAEROBIC Blood Culture adequate volume   Culture      NO GROWTH 5 DAYS Performed at [facility], [address] [place], [place] [address]    Report Status 05/11/2023 FINAL   Magnesium     Status: Abnormal   Collection Time: 05/06/23  6:14 PM  Result Value Ref Range   Magnesium 0.9 (LL) 1.7 - 2.4 mg/dL    Comment: CRITICAL RESULT CALLED TO, READ BACK BY AND VERIFIED WITH J TURNBOW,RN 1921 05/06/2023 WBOND REPEATED TO VERIFY Performed at [facility], [address] [place], [place] [address]   Procalcitonin     Status: None   Collection Time: 05/06/23  6:14 PM  Result Value Ref Range   Procalcitonin <0.10 ng/mL    Comment:        Interpretation: PCT (Procalcitonin) <= 0.5 ng/mL: Systemic infection (sepsis) is not likely. Local bacterial infection is possible. (NOTE)       Sepsis PCT Algorithm           Lower Respiratory Tract                                      Infection PCT Algorithm    ----------------------------     ----------------------------         PCT < 0.25 ng/mL                PCT < 0.10 ng/mL          Strongly encourage             Strongly discourage   discontinuation of antibiotics    initiation of antibiotics    ----------------------------     -----------------------------       PCT 0.25 - 0.50 ng/mL            PCT 0.10 - 0.25 ng/mL               OR       >80% decrease in PCT            Discourage initiation of                                            antibiotics      Encourage discontinuation           of antibiotics    ----------------------------     -----------------------------         PCT >= 0.50 ng/mL              PCT 0.26 - 0.50 ng/mL               AND        <80% decrease in PCT  Encourage initiation of                                             antibiotics       Encourage continuation           of  antibiotics    ----------------------------     -----------------------------        PCT >= 0.50 ng/mL                  PCT > 0.50 ng/mL               AND         increase in PCT                  Strongly encourage                                      initiation of antibiotics    Strongly encourage escalation           of antibiotics                                     -----------------------------                                           PCT <= 0.25 ng/mL                                                 OR                                        > 80% decrease in PCT                                      Discontinue / Do not initiate                                             antibiotics  Performed at [facility], [address], [place], [place] [address]   I-Stat Lactic Acid     Status: None   Collection Time: 05/06/23  6:24 PM  Result Value Ref Range   Lactic Acid, Venous 1.0 0.5 - 1.9 mmol/L  Comprehensive metabolic panel     Status: Abnormal   Collection Time: 05/07/23  1:43 AM  Result Value Ref Range   Sodium 138 135 - 145 mmol/L   Potassium 4.8 3.5 - 5.1 mmol/L   Chloride 99 98 - 111 mmol/L   CO2 33 (H) 22 - 32 mmol/L   Glucose, Bld 127 (H) 70 - 99 mg/dL    Comment: Glucose reference range applies only to  samples taken after fasting for at least 8 hours.   BUN 17 8 - 23 mg/dL   Creatinine, Ser 9.51 0.61 - 1.24 mg/dL   Calcium 8.8 (L) 8.9 - 10.3 mg/dL   Total Protein 5.5 (L) 6.5 - 8.1 g/dL   Albumin 2.8 (L) 3.5 - 5.0 g/dL   AST 12 (L) 15 - 41 U/L   ALT 13 0 - 44 U/L   Alkaline Phosphatase 77 38 - 126 U/L   Total Bilirubin 0.8 0.3 - 1.2 mg/dL   GFR, Estimated >88 >41 mL/min    Comment: (NOTE) Calculated using the CKD-EPI Creatinine Equation (2021)    Anion gap 6 5 - 15    Comment: Performed at [facility] Lab, [address], [place], [place] [address]  CBC     Status: Abnormal   Collection Time: 05/07/23  1:43 AM  Result Value Ref Range   WBC 7.0 4.0  - 10.5 K/uL   RBC 3.79 (L) 4.22 - 5.81 MIL/uL   Hemoglobin 10.4 (L) 13.0 - 17.0 g/dL   HCT 01.6 (L) 01.0 - 93.2 %   MCV 90.2 80.0 - 100.0 fL   MCH 27.4 26.0 - 34.0 pg   MCHC 30.4 30.0 - 36.0 g/dL   RDW 35.5 73.2 - 20.2 %   Platelets 230 150 - 400 K/uL   nRBC 0.0 0.0 - 0.2 %    Comment: Performed at [facility], [address], [place], [place] [address]  Procalcitonin     Status: None   Collection Time: 05/07/23  1:43 AM  Result Value Ref Range   Procalcitonin <0.10 ng/mL    Comment:        Interpretation: PCT (Procalcitonin) <= 0.5 ng/mL: Systemic infection (sepsis) is not likely. Local bacterial infection is possible. (NOTE)       Sepsis PCT Algorithm           Lower Respiratory Tract                                      Infection PCT Algorithm    ----------------------------     ----------------------------         PCT < 0.25 ng/mL                PCT < 0.10 ng/mL          Strongly encourage             Strongly discourage   discontinuation of antibiotics    initiation of antibiotics    ----------------------------     -----------------------------       PCT 0.25 - 0.50 ng/mL            PCT 0.10 - 0.25 ng/mL               OR       >80% decrease in PCT            Discourage initiation of                                            antibiotics      Encourage discontinuation           of antibiotics    ----------------------------     -----------------------------  PCT >= 0.50 ng/mL              PCT 0.26 - 0.50 ng/mL               AND        <80% decrease in PCT             Encourage initiation of                                             antibiotics       Encourage continuation           of antibiotics    ----------------------------     -----------------------------        PCT >= 0.50 ng/mL                  PCT > 0.50 ng/mL               AND         increase in PCT                  Strongly encourage                                      initiation of  antibiotics    Strongly encourage escalation           of antibiotics                                     -----------------------------                                           PCT <= 0.25 ng/mL                                                 OR                                        > 80% decrease in PCT                                      Discontinue / Do not initiate                                             antibiotics  Performed at [name], [address], [place], [place] [address]   Magnesium     Status: Abnormal   Collection Time: 05/07/23 11:02 AM  Result Value Ref Range   Magnesium 1.6 (L) 1.7 - 2.4 mg/dL    Comment: Performed at [facility], [address], [place], [place] [address]  Basic metabolic  panel     Status: Abnormal   Collection Time: 05/08/23 12:27 AM  Result Value Ref Range   Sodium 136 135 - 145 mmol/L   Potassium 4.1 3.5 - 5.1 mmol/L   Chloride 95 (L) 98 - 111 mmol/L   CO2 32 22 - 32 mmol/L   Glucose, Bld 179 (H) 70 - 99 mg/dL    Comment: Glucose reference range applies only to samples taken after fasting for at least 8 hours.   BUN 23 8 - 23 mg/dL   Creatinine, Ser 8.41 0.61 - 1.24 mg/dL   Calcium 8.8 (L) 8.9 - 10.3 mg/dL   GFR, Estimated >32 >44 mL/min    Comment: (NOTE) Calculated using the CKD-EPI Creatinine Equation (2021)    Anion gap 9 5 - 15    Comment: Performed at [facility], [address], [place], [place] [address]  Magnesium     Status: None   Collection Time: 05/08/23 12:27 AM  Result Value Ref Range   Magnesium 1.8 1.7 - 2.4 mg/dL    Comment: Performed at [facility], [address] [place], [place] [address]  Basic metabolic panel     Status: Abnormal   Collection Time: 05/09/23 12:48 AM  Result Value Ref Range   Sodium 136 135 - 145 mmol/L   Potassium 4.4 3.5 - 5.1 mmol/L   Chloride 94 (L) 98 - 111 mmol/L   CO2 31 22 - 32 mmol/L   Glucose, Bld 150 (H) 70 - 99 mg/dL    Comment: Glucose  reference range applies only to samples taken after fasting for at least 8 hours.   BUN 34 (H) 8 - 23 mg/dL   Creatinine, Ser 4.40 (H) 0.61 - 1.24 mg/dL   Calcium 8.7 (L) 8.9 - 10.3 mg/dL   GFR, Estimated 46 (L) >60 mL/min    Comment: (NOTE) Calculated using the CKD-EPI Creatinine Equation (2021)    Anion gap 11 5 - 15    Comment: Performed at [facility], [address] [place], [place] [address]  Basic metabolic panel     Status: Abnormal   Collection Time: 05/10/23  7:48 AM  Result Value Ref Range   Sodium 140 135 - 145 mmol/L   Potassium 4.4 3.5 - 5.1 mmol/L   Chloride 94 (L) 98 - 111 mmol/L   CO2 32 22 - 32 mmol/L   Glucose, Bld 196 (H) 70 - 99 mg/dL    Comment: Glucose reference range applies only to samples taken after fasting for at least 8 hours.   BUN 35 (H) 8 - 23 mg/dL   Creatinine, Ser 5.95 (H) 0.61 - 1.24 mg/dL   Calcium 9.2 8.9 - 63.8 mg/dL   GFR, Estimated 54 (L) >60 mL/min    Comment: (NOTE) Calculated using the CKD-EPI Creatinine Equation (2021)    Anion gap 14 5 - 15    Comment: Performed at [facility], [address], [place], [place] [address]  Basic metabolic panel     Status: Abnormal   Collection Time: 05/15/23  2:56 PM  Result Value Ref Range   Sodium 137 135 - 145 mmol/L   Potassium 4.3 3.5 - 5.1 mmol/L   Chloride 97 (L) 98 - 111 mmol/L   CO2 31 22 - 32 mmol/L   Glucose, Bld 102 (H) 70 - 99 mg/dL    Comment: Glucose reference range applies only to samples taken after fasting for at least 8 hours.  BUN 35 (H) 8 - 23 mg/dL   Creatinine, Ser 2.13 (H) 0.61 - 1.24 mg/dL   Calcium 8.9 8.9 - 08.6 mg/dL   GFR, Estimated 59 (L) >60 mL/min    Comment: (NOTE) Calculated using the CKD-EPI Creatinine Equation (2021)    Anion gap 9 5 - 15    Comment: Performed at [facility], [address], [place], [place] [address]  CBC     Status: Abnormal   Collection Time: 05/15/23  2:56 PM  Result Value Ref Range   WBC 16.6 (H) 4.0 - 10.5  K/uL   RBC 4.31 4.22 - 5.81 MIL/uL   Hemoglobin 11.7 (L) 13.0 - 17.0 g/dL   HCT 96.2 (L) 95.2 - 84.1 %   MCV 88.9 80.0 - 100.0 fL   MCH 27.1 26.0 - 34.0 pg   MCHC 30.5 30.0 - 36.0 g/dL   RDW 32.4 40.1 - 02.7 %   Platelets 289 150 - 400 K/uL   nRBC 0.0 0.0 - 0.2 %    Comment: Performed at [facility], [address] [place], [place] [address]  Troponin I (High Sensitivity)     Status: None   Collection Time: 05/15/23  2:56 PM  Result Value Ref Range   Troponin I (High Sensitivity) 9 <18 ng/L    Comment: (NOTE) Elevated high sensitivity troponin I (hsTnI) values and significant  changes across serial measurements may suggest ACS but many other  chronic and acute conditions are known to elevate hsTnI results.  Refer to the "Links" section for chest pain algorithms and additional  guidance. Performed at [facility], [address], [place], [place] [address]   Troponin I (High Sensitivity)     Status: None   Collection Time: 05/15/23  5:26 PM  Result Value Ref Range   Troponin I (High Sensitivity) 11 <18 ng/L    Comment: (NOTE) Elevated high sensitivity troponin I (hsTnI) values and significant  changes across serial measurements may suggest ACS but many other  chronic and acute conditions are known to elevate hsTnI results.  Refer to the "Links" section for chest pain algorithms and additional  guidance. Performed at [facility], [address], [place], [place] [address]   SARS CORONAVIRUS 2 (TAT 6-24 HRS) Anterior Nasal Swab     Status: None   Collection Time: 05/17/23  9:10 AM   Specimen: Anterior Nasal Swab  Result Value Ref Range   SARS Coronavirus 2 NEGATIVE NEGATIVE    Comment: (NOTE) SARS-CoV-2 target nucleic acids are NOT DETECTED.  The SARS-CoV-2 RNA is generally detectable in upper and lower respiratory specimens during the acute phase of infection. Negative results do not preclude SARS-CoV-2 infection, do not rule out co-infections with  other pathogens, and should not be used as the sole basis for treatment or other patient management decisions. Negative results must be combined with clinical observations, patient history, and epidemiological information. The expected result is Negative.  Fact Sheet for Patients: HairSlick.no  Fact Sheet for Healthcare Providers: quierodirigir.com  This test is not yet approved or cleared by the [place] FDA and  has been authorized for detection and/or diagnosis of SARS-CoV-2 by FDA under an Emergency Use Authorization (EUA). This EUA will remain  in effect (meaning this test can be used) for the duration of the COVID-19 declaration under Se ction 564(b)(1) of the Act, 21 U.S.C. section 360bbb-3(b)(1), unless the authorization is terminated or revoked sooner.  Performed at [facility], [address] [place], [place] [address]   Basic metabolic panel     Status: None   Collection Time: 05/18/23  3:48 PM  Result Value Ref Range   Glucose 88 70 - 99 mg/dL   BUN 23 8 - 27 mg/dL   Creatinine, Ser 6.21 0.76 - 1.27 mg/dL   eGFR 65 >30 QM/VHQ/4.69   BUN/Creatinine Ratio 19 10 - 24   Sodium 136 134 - 144 mmol/L   Potassium 4.5 3.5 - 5.2 mmol/L   Chloride 96 96 - 106 mmol/L   CO2 29 20 - 29 mmol/L   Calcium 8.7 8.6 - 10.2 mg/dL  CBC with Differential/Platelet     Status: Abnormal   Collection Time: 05/18/23  3:48 PM  Result Value Ref Range   WBC 9.7 3.4 - 10.8 x10E3/uL   RBC 3.93 (L) 4.14 - 5.80 x10E6/uL   Hemoglobin 10.8 (L) 13.0 - 17.7 g/dL   Hematocrit 62.9 (L) 52.8 - 51.0 %   MCV 87 79 - 97 fL   MCH 27.5 26.6 - 33.0 pg   MCHC 31.5 31.5 - 35.7 g/dL   RDW 41.3 24.4 - 01.0 %   Platelets 250 150 - 450 x10E3/uL   Neutrophils 73 Not Estab. %   Lymphs 9 Not Estab. %   Monocytes 11 Not Estab. %   Eos 1 Not Estab. %   Basos 1 Not Estab. %   Neutrophils Absolute 7.2 (H) 1.4 - 7.0 x10E3/uL   Lymphocytes Absolute  0.9 0.7 - 3.1 x10E3/uL   Monocytes Absolute 1.1 (H) 0.1 - 0.9 x10E3/uL   EOS (ABSOLUTE) 0.1 0.0 - 0.4 x10E3/uL   Basophils Absolute 0.1 0.0 - 0.2 x10E3/uL   Immature Granulocytes 5 Not Estab. %   Immature Grans (Abs) 0.5 (H) 0.0 - 0.1 x10E3/uL    Comment: (An elevated percentage of Immature Granulocytes has not been found to be clinically significant as a sole clinical predictor of disease. Does NOT include bands or blast cells.  Pregnancy associated physiological leukocytosis may also show increased immature granulocytes without clinical significance.)   Magnesium     Status: None   Collection Time: 05/18/23  3:48 PM  Result Value Ref Range   Magnesium 1.6 1.6 - 2.3 mg/dL  Pro b natriuretic peptide (BNP)     Status: Abnormal   Collection Time: 05/18/23  3:48 PM  Result Value Ref Range   NT-Pro BNP 1,876 (H) 0 - 376 pg/mL    Comment: The following cut-points have been suggested for the use of proBNP for the diagnostic evaluation of heart failure (HF) in patients with acute dyspnea: Modality                     Age           Optimal Cut                            (years)            Point ------------------------------------------------------ Diagnosis (rule in HF)        <50            450 pg/mL                           50 - 75            900 pg/mL                               >  75           1800 pg/mL Exclusion (rule out HF)  Age independent     300 pg/mL   LDL cholesterol, direct     Status: None   Collection Time: 05/18/23  3:48 PM  Result Value Ref Range   LDL Direct 99 0 - 99 mg/dL  Basic metabolic panel     Status: Abnormal   Collection Time: 05/25/23  8:00 AM  Result Value Ref Range   Glucose 97 70 - 99 mg/dL   BUN 29 (H) 8 - 27 mg/dL   Creatinine, Ser 1.32 (H) 0.76 - 1.27 mg/dL   eGFR 50 (L) >44 WN/UUV/2.53   BUN/Creatinine Ratio 20 10 - 24   Sodium 139 134 - 144 mmol/L   Potassium 5.0 3.5 - 5.2 mmol/L   Chloride 100 96 - 106 mmol/L   CO2 28 20 - 29 mmol/L    Calcium 9.5 8.6 - 10.2 mg/dL  Pro b natriuretic peptide (BNP)     Status: Abnormal   Collection Time: 05/25/23  8:00 AM  Result Value Ref Range   NT-Pro BNP 2,365 (H) 0 - 376 pg/mL    Comment: The following cut-points have been suggested for the use of proBNP for the diagnostic evaluation of heart failure (HF) in patients with acute dyspnea: Modality                     Age           Optimal Cut                            (years)            Point ------------------------------------------------------ Diagnosis (rule in HF)        <50            450 pg/mL                           50 - 75            900 pg/mL                               >75           1800 pg/mL Exclusion (rule out HF)  Age independent     300 pg/mL   CBC with Differential     Status: Abnormal   Collection Time: 05/26/23  9:21 PM  Result Value Ref Range   WBC 8.7 4.0 - 10.5 K/uL   RBC 3.53 (L) 4.22 - 5.81 MIL/uL   Hemoglobin 9.9 (L) 13.0 - 17.0 g/dL   HCT 66.4 (L) 40.3 - 47.4 %   MCV 91.2 80.0 - 100.0 fL   MCH 28.0 26.0 - 34.0 pg   MCHC 30.7 30.0 - 36.0 g/dL   RDW 25.9 56.3 - 87.5 %   Platelets 192 150 - 400 K/uL   nRBC 0.0 0.0 - 0.2 %   Neutrophils Relative % 68 %   Neutro Abs 5.9 1.7 - 7.7 K/uL   Lymphocytes Relative 20 %   Lymphs Abs 1.7 0.7 - 4.0 K/uL   Monocytes Relative 9 %   Monocytes Absolute 0.8 0.1 - 1.0 K/uL   Eosinophils Relative 2 %   Eosinophils Absolute 0.2 0.0 - 0.5 K/uL   Basophils Relative 0 %  Basophils Absolute 0.0 0.0 - 0.1 K/uL   Immature Granulocytes 1 %   Abs Immature Granulocytes 0.10 (H) 0.00 - 0.07 K/uL    Comment: Performed at [facility], [address], [place], [place] [address]  Comprehensive metabolic panel     Status: Abnormal   Collection Time: 05/26/23  9:21 PM  Result Value Ref Range   Sodium 138 135 - 145 mmol/L   Potassium 5.3 (H) 3.5 - 5.1 mmol/L   Chloride 99 98 - 111 mmol/L   CO2 29 22 - 32 mmol/L   Glucose, Bld 136 (H) 70 - 99 mg/dL    Comment:  Glucose reference range applies only to samples taken after fasting for at least 8 hours.   BUN 38 (H) 8 - 23 mg/dL   Creatinine, Ser 8.65 (H) 0.61 - 1.24 mg/dL   Calcium 9.2 8.9 - 78.4 mg/dL   Total Protein 6.2 (L) 6.5 - 8.1 g/dL   Albumin 3.1 (L) 3.5 - 5.0 g/dL   AST 16 15 - 41 U/L   ALT 15 0 - 44 U/L   Alkaline Phosphatase 71 38 - 126 U/L   Total Bilirubin 0.2 (L) 0.3 - 1.2 mg/dL   GFR, Estimated 39 (L) >60 mL/min    Comment: (NOTE) Calculated using the CKD-EPI Creatinine Equation (2021)    Anion gap 10 5 - 15    Comment: Performed at [facility], [address] [place], [place] [address]  Troponin I (High Sensitivity)     Status: None   Collection Time: 05/26/23  9:21 PM  Result Value Ref Range   Troponin I (High Sensitivity) 7 <18 ng/L    Comment: (NOTE) Elevated high sensitivity troponin I (hsTnI) values and significant  changes across serial measurements may suggest ACS but many other  chronic and acute conditions are known to elevate hsTnI results.  Refer to the "Links" section for chest pain algorithms and additional  guidance. Performed at [facility], [address], [place], [place] [address]   Brain natriuretic peptide     Status: Abnormal   Collection Time: 05/26/23  9:21 PM  Result Value Ref Range   B Natriuretic Peptide 319.7 (H) 0.0 - 100.0 pg/mL    Comment: Performed at [facility], [address] [place], [place] [address]  Magnesium     Status: None   Collection Time: 05/26/23  9:21 PM  Result Value Ref Range   Magnesium 1.7 1.7 - 2.4 mg/dL    Comment: Performed at [facility] Lab, [address] [place], [place] [address]  I-Stat venous blood gas, [facility], [facility], [facility])     Status: Abnormal   Collection Time: 05/26/23  9:42 PM  Result Value Ref Range   pH, Ven 7.331 7.25 - 7.43   pCO2, Ven 59.6 44 - 60 mmHg   pO2, Ven 70 (H) 32 - 45 mmHg   Bicarbonate 31.4 (H) 20.0 - 28.0 mmol/L   TCO2 33 (H) 22 - 32 mmol/L   O2 Saturation 92 %    Acid-Base Excess 4.0 (H) 0.0 - 2.0 mmol/L   Sodium 137 135 - 145 mmol/L   Potassium 5.1 3.5 - 5.1 mmol/L   Calcium, Ion 1.20 1.15 - 1.40 mmol/L   HCT 31.0 (L) 39.0 - 52.0 %   Hemoglobin 10.5 (L) 13.0 - 17.0 g/dL   Sample type VENOUS   SARS Coronavirus 2 by RT PCR (hospital order, performed in [facility]  hospital lab) *cepheid single result test* Anterior Nasal Swab     Status: None   Collection Time: 05/26/23  9:56 PM   Specimen: Anterior Nasal Swab  Result Value Ref Range   SARS Coronavirus 2 by RT PCR NEGATIVE NEGATIVE    Comment: Performed at [facility], [address] [place], [place] [address]  Lactic acid, plasma     Status: None   Collection Time: 05/26/23 10:41 PM  Result Value Ref Range   Lactic Acid, Venous 0.7 0.5 - 1.9 mmol/L    Comment: Performed at [facility] Lab, [address], [place], [place] [address]  Blood culture (routine x 2)     Status: None   Collection Time: 05/26/23 10:41 PM   Specimen: BLOOD LEFT ARM  Result Value Ref Range   Specimen Description BLOOD LEFT ARM    Special Requests      BOTTLES DRAWN AEROBIC AND ANAEROBIC Blood Culture results may not be optimal due to an excessive volume of blood received in culture bottles   Culture      NO GROWTH 5 DAYS Performed at [facility], [address], [place], [place] [address]    Report Status 05/31/2023 FINAL   Blood culture (routine x 2)     Status: None   Collection Time: 05/26/23 10:41 PM   Specimen: BLOOD RIGHT HAND  Result Value Ref Range   Specimen Description BLOOD RIGHT HAND    Special Requests      BOTTLES DRAWN AEROBIC AND ANAEROBIC Blood Culture adequate volume   Culture      NO GROWTH 5 DAYS Performed at [facility] Lab, [address], [place], [place] [address]    Report Status 05/31/2023 FINAL   Troponin I (High Sensitivity)     Status: None   Collection Time: 05/26/23 11:50 PM  Result Value Ref Range   Troponin I (High Sensitivity) 6 <18 ng/L    Comment:  (NOTE) Elevated high sensitivity troponin I (hsTnI) values and significant  changes across serial measurements may suggest ACS but many other  chronic and acute conditions are known to elevate hsTnI results.  Refer to the "Links" section for chest pain algorithms and additional  guidance. Performed at [facility] Lab, [address] [place], [place] [address]   HIV Antibody (routine testing w rflx)     Status: None   Collection Time: 05/27/23  9:05 AM  Result Value Ref Range   HIV Screen 4th Generation wRfx Non Reactive Non Reactive    Comment: Performed at [facility], [address], [place], [place] [address]  Basic metabolic panel     Status: Abnormal   Collection Time: 05/27/23  9:05 AM  Result Value Ref Range   Sodium 133 (L) 135 - 145 mmol/L   Potassium 5.2 (H) 3.5 - 5.1 mmol/L   Chloride 98 98 - 111 mmol/L   CO2 27 22 - 32 mmol/L   Glucose, Bld 199 (H) 70 - 99 mg/dL    Comment: Glucose reference range applies only to samples taken after fasting for at least 8 hours.   BUN 32 (H) 8 - 23 mg/dL   Creatinine, Ser 6.96 (H) 0.61 - 1.24 mg/dL   Calcium 9.0 8.9 - 29.5 mg/dL   GFR, Estimated 59 (L) >60 mL/min    Comment: (NOTE) Calculated using the CKD-EPI Creatinine Equation (2021)    Anion gap 8 5 - 15    Comment: Performed  at [facility], [address], [place], [place] [address]  CBC     Status: Abnormal   Collection Time: 05/27/23  9:05 AM  Result Value Ref Range   WBC 6.6 4.0 - 10.5 K/uL   RBC 3.66 (L) 4.22 - 5.81 MIL/uL   Hemoglobin 9.9 (L) 13.0 - 17.0 g/dL   HCT 86.5 (L) 78.4 - 69.6 %   MCV 88.3 80.0 - 100.0 fL   MCH 27.0 26.0 - 34.0 pg   MCHC 30.7 30.0 - 36.0 g/dL   RDW 29.5 28.4 - 13.2 %   Platelets 199 150 - 400 K/uL   nRBC 0.0 0.0 - 0.2 %    Comment: Performed at [facility], [address] [place], [place] [address]  Magnesium     Status: None   Collection Time: 05/27/23  9:05 AM  Result Value Ref Range   Magnesium 2.1 1.7 - 2.4  mg/dL    Comment: Performed at [facility], [address], [place], [place] [address]  TSH     Status: Abnormal   Collection Time: 05/27/23  9:05 AM  Result Value Ref Range   TSH 0.237 (L) 0.350 - 4.500 uIU/mL    Comment: Performed by a 3rd Generation assay with a functional sensitivity of <=0.01 uIU/mL. Performed at [facility], [address], [place], [place] [address]   Potassium     Status: Abnormal   Collection Time: 05/27/23  1:59 PM  Result Value Ref Range   Potassium 5.6 (H) 3.5 - 5.1 mmol/L    Comment: Performed at [facility], [address], [place], [place] [address]  CBC     Status: Abnormal   Collection Time: 05/28/23  8:10 AM  Result Value Ref Range   WBC 9.0 4.0 - 10.5 K/uL   RBC 3.55 (L) 4.22 - 5.81 MIL/uL   Hemoglobin 9.7 (L) 13.0 - 17.0 g/dL   HCT 59.5 (L) 63.8 - 75.6 %   MCV 90.4 80.0 - 100.0 fL   MCH 27.3 26.0 - 34.0 pg   MCHC 30.2 30.0 - 36.0 g/dL   RDW 43.3 29.5 - 18.8 %   Platelets 207 150 - 400 K/uL   nRBC 0.0 0.0 - 0.2 %    Comment: Performed at [facility], [address], [place], [place] [address]  Magnesium     Status: None   Collection Time: 05/28/23  8:10 AM  Result Value Ref Range   Magnesium 2.0 1.7 - 2.4 mg/dL    Comment: Performed at [facility], [address] [place], [place] [address]  Basic metabolic panel     Status: Abnormal   Collection Time: 05/28/23  8:10 AM  Result Value Ref Range   Sodium 138 135 - 145 mmol/L   Potassium 4.6 3.5 - 5.1 mmol/L   Chloride 103 98 - 111 mmol/L   CO2 29 22 - 32 mmol/L   Glucose, Bld 136 (H) 70 - 99 mg/dL    Comment: Glucose reference range applies only to samples taken after fasting for at least 8 hours.   BUN 28 (H) 8 - 23 mg/dL   Creatinine, Ser 0.10 0.61 - 1.24 mg/dL   Calcium 9.0 8.9 - 93.2 mg/dL   GFR, Estimated >35 >57 mL/min    Comment: (NOTE) Calculated using the CKD-EPI Creatinine Equation (2021)    Anion gap 6 5 - 15    Comment: Performed at  [facility] Lab, [address] [place], [place] [address]  Phosphorus     Status: None   Collection Time: 05/28/23  8:10 AM  Result Value Ref Range   Phosphorus 2.8 2.5 - 4.6 mg/dL    Comment: Performed at [facility], [address], [place], [place] [address]  Potassium     Status: None   Collection Time: 05/28/23  8:30 AM  Result Value Ref Range   Potassium 4.6 3.5 - 5.1 mmol/L    Comment: Performed at [facility] Lab, [address], [place], [place] [address]  IBC + Ferritin     Status: Abnormal   Collection Time: 06/22/23 10:26 AM  Result Value Ref Range   Iron 59 42 - 165 ug/dL   Transferrin 914.7 829.5 - 360.0 mg/dL   Saturation Ratios 62.1 (L) 20.0 - 50.0 %   Ferritin 65.5 22.0 - 322.0 ng/mL   TIBC 390.6 250.0 - 450.0 mcg/dL  CBC with Differential/Platelet     Status: Abnormal   Collection Time: 06/22/23 10:26 AM  Result Value Ref Range   WBC 6.1 4.0 - 10.5 K/uL   RBC 4.13 (L) 4.22 - 5.81 Mil/uL   Hemoglobin 11.4 (L) 13.0 - 17.0 g/dL   HCT 30.8 (L) 65.7 - 84.6 %   MCV 88.3 78.0 - 100.0 fl   MCHC 31.3 30.0 - 36.0 g/dL   RDW 96.2 (H) 95.2 - 84.1 %   Platelets 243.0 150.0 - 400.0 K/uL   Neutrophils Relative % 72.1 43.0 - 77.0 %   Lymphocytes Relative 17.1 12.0 - 46.0 %   Monocytes Relative 9.3 3.0 - 12.0 %   Eosinophils Relative 1.2 0.0 - 5.0 %   Basophils Relative 0.3 0.0 - 3.0 %   Neutro Abs 4.4 1.4 - 7.7 K/uL   Lymphs Abs 1.1 0.7 - 4.0 K/uL   Monocytes Absolute 0.6 0.1 - 1.0 K/uL   Eosinophils Absolute 0.1 0.0 - 0.7 K/uL   Basophils Absolute 0.0 0.0 - 0.1 K/uL         has a past medical history of Adjustment disorder with other symptom (11/27/2015), AKI (acute kidney injury) (HCC) (01/14/2016), Aortic atherosclerosis (HCC) (01/26/2023), Arthritis, Atrial fibrillation (HCC) (10/26/2011), BPH (benign prostatic hyperplasia) (2011), BRONCHOPNEUMONIA ORGANISM UNSPECIFIED (06/12/2010), Carpal tunnel syndrome, right upper limb (11/03/2022), Chronic  respiratory failure with hypoxia (HCC) (08/27/2015), Congestive heart failure (HCC), COPD (chronic obstructive pulmonary disease) (HCC), COPD, severe (HCC) (11/27/2015), Coronary artery calcification (01/26/2023), Depression, 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 [facility]), Drug-induced neutropenia (HCC) (10/08/2015), Encounter for antineoplastic chemotherapy (10/08/2015), Essential hypertension (06/05/2008), Gastrointestinal obstruction (HCC), GERD (gastroesophageal reflux disease), Gout attack (08/29/2012), colonoscopy, Hypothyroidism (03/30/2012), Lesion of ulnar nerve, right upper limb (11/03/2022), Lung cancer (HCC) (2011), Lymphadenopathy syndrome (09/12/2015), Mediastinal adenopathy (09/12/2015), NHL (non-Hodgkin's lymphoma) (HCC) (09/25/2015), Numbness of right hand (06/28/2022), Obesity, Open wound of right hand without foreign body (07/27/2021), Other dysphagia (11/11/2010), Polycythemia (01/26/2023), POLYCYTHEMIA (06/05/2008), Port catheter in place (08/20/2016), Post-nasal drip (10/04/2011), Primary cancer of right upper lobe of lung (HCC) (10/25/2019), Pulmonary hypertension (HCC), Pulmonary nodule, right (10/03/2019), Pure hypercholesterolemia (12/03/2011), Recurrent pneumonia (02/10/2012), Routine general medical examination at a health care facility (12/03/2011), Sepsis (HCC) (01/14/2016), Severe sepsis (HCC) (01/14/2016), 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   reports that he quit smoking about 21 years ago. His smoking use included cigarettes. He started  smoking about 64 years ago. He has a 129 pack-year smoking history. He has never used smokeless tobacco.  Past Surgical History:  Procedure Laterality Date   APPENDECTOMY     BACK SURGERY     CARDIOVERSION  11/27/2011   Procedure:  CARDIOVERSION;  Surgeon: Vesta Mixer, MD;  Location: [facility] ENDOSCOPY;  Service: Cardiovascular;  Laterality: N/A;   COLONOSCOPY     FUDUCIAL PLACEMENT Right 10/13/2019   Procedure: Placement Of Fuducial;  Surgeon: Leslye Peer, MD;  Location: [facility] OR;  Service: Thoracic;  Laterality: Right;  Right Upper Lobe    LEFT HEART CATH AND CORONARY ANGIOGRAPHY N/A 04/12/2023   Procedure: LEFT HEART CATH AND CORONARY ANGIOGRAPHY;  Surgeon: Marykay Lex, MD;  Location: [facility] INVASIVE CV LAB;  Service: Cardiovascular;  Laterality: N/A;   LUNG CANCER SURGERY  2011   UPPER GI ENDOSCOPY     VIDEO BRONCHOSCOPY WITH ENDOBRONCHIAL NAVIGATION N/A 10/13/2019   Procedure: VIDEO BRONCHOSCOPY WITH ENDOBRONCHIAL NAVIGATION;  Surgeon: Leslye Peer, MD;  Location: [facility] OR;  Service: Thoracic;  Laterality: N/A;   VIDEO BRONCHOSCOPY WITH ENDOBRONCHIAL ULTRASOUND N/A 10/13/2019   Procedure: VIDEO BRONCHOSCOPY WITH ENDOBRONCHIAL ULTRASOUND;  Surgeon: Leslye Peer, MD;  Location: [facility] OR;  Service: Thoracic;  Laterality: N/A;    Allergies  Allergen Reactions   Lipitor [Atorvastatin] Other (See Comments)    Myalgias  Weakness  Fatigue    Omnipaque [Iohexol] Itching and Other (See Comments)    When asked about contrast media today, pt stated that last time he had the contrast that he was red all over and itchy from head to toe.     Immunization History  Administered Date(s) Administered   Fluad Trivalent(High Dose 65+) 05/28/2023   Influenza Whole 06/21/2009, 08/30/2011   Influenza, High Dose Seasonal PF 06/10/2017, 07/27/2018, 06/22/2019   Influenza-Unspecified 09/16/2015, 05/13/2016   Pneumococcal Conjugate-13 08/06/2017   Pneumococcal Polysaccharide-23 05/22/2008   Tdap 12/03/2011    Family History  Problem Relation Age of Onset   Heart disease Father    Cancer Neg Hx    Diabetes Neg Hx    Stroke Neg Hx    Hypertension Neg Hx    Kidney disease Neg Hx      Current Outpatient Medications:     allopurinol (ZYLOPRIM) 300 MG tablet, Take 300 mg by mouth daily., Disp: , Rfl:    amLODipine (NORVASC) 5 MG tablet, Take 5 mg by mouth daily., Disp: , Rfl:    apixaban (ELIQUIS) 5 MG TABS tablet, Take 1 tablet (5 mg total) by mouth 2 (two) times daily., Disp: 180 tablet, Rfl: 3   betamethasone dipropionate 0.05 % lotion, Apply 1 application  topically 2 (two) times daily. To legs for psoriasis, Disp: , Rfl:    Buprenorphine HCl-Naloxone HCl 8-2 MG FILM, Place 0.5-1 Film under the tongue 2 (two) times daily as needed (back pain)., Disp: , Rfl:    ezetimibe (ZETIA) 10 MG tablet, Take 10 mg by mouth daily., Disp: , Rfl:    finasteride (PROSCAR) 5 MG tablet, Take 5 mg by mouth daily., Disp: , Rfl:    furosemide (LASIX) 20 MG tablet, Take 1 tablet (20 mg total) by mouth daily., Disp: 30 tablet, Rfl: 11   ipratropium-albuterol (DUONEB) 0.5-2.5 (3) MG/3ML SOLN, Take 3 mLs by nebulization every 6 (six) hours as needed., Disp: 360 mL, Rfl: 1   levothyroxine (SYNTHROID) 175 MCG tablet, Take 175 mcg by mouth daily., Disp: , Rfl:    LINZESS 290 MCG CAPS  capsule, Take 290 mcg by mouth daily., Disp: , Rfl:    lisinopril (PRINIVIL,ZESTRIL) 10 MG tablet, Take 1 tablet (10 mg total) by mouth daily., Disp: 30 tablet, Rfl: 11   Magnesium Chloride 64 MG TABS, Take 64 mg by mouth daily in the afternoon., Disp: 90 tablet, Rfl: 3   metoprolol succinate (TOPROL XL) 25 MG 24 hr tablet, Take 1 tablet (25 mg total) by mouth daily., Disp: 90 tablet, Rfl: 3   omeprazole (PRILOSEC) 20 MG capsule, Take 1 capsule (20 mg total) by mouth 2 (two) times daily., Disp: , Rfl:    ondansetron (ZOFRAN-ODT) 4 MG disintegrating tablet, Take 1 tablet (4 mg total) by mouth every 8 (eight) hours as needed., Disp: 20 tablet, Rfl: 0   PROAIR HFA 108 (90 Base) MCG/ACT inhaler, Inhale 2 puffs into the lungs every 6 (six) hours as needed for wheezing or shortness of breath., Disp: 3 Inhaler, Rfl: 1   Prucalopride Succinate (MOTEGRITY) 2 MG TABS,  Take 1 tablet (2 mg total) by mouth daily. Exp: 06-2024, Disp: , Rfl:    rosuvastatin (CRESTOR) 10 MG tablet, Take 1 tablet (10 mg total) by mouth daily., Disp: 90 tablet, Rfl: 3   TRELEGY ELLIPTA 100-62.5-25 MCG/INH AEPB, Inhale 1 puff into the lungs daily., Disp: , Rfl:    zolpidem (AMBIEN) 10 MG tablet, Take 10 mg by mouth at bedtime as needed for sleep., Disp: , Rfl:       Objective:   Vitals:   07/12/23 0936  BP: (!) 135/54  Pulse: 65  Temp: 97.8 F (36.6 C)  TempSrc: Oral  SpO2: 95%  Weight: 210 lb 9.6 oz (95.5 kg)  Height: 6' (1.829 m)    Estimated body mass index is 28.56 kg/m as calculated from the following:   Height as of this encounter: 6' (1.829 m).   Weight as of this encounter: 210 lb 9.6 oz (95.5 kg).  @WEIGHTCHANGE @  Filed Weights   07/12/23 0936  Weight: 210 lb 9.6 oz (95.5 kg)     Physical Exam   General: No distress. Looks well O2 at rest: no Cane present: no Sitting in wheel chair: no Frail: no Obese: no Neuro: Alert and Oriented x 3. GCS 15. Speech normal Psych: Pleasant Resp:  Barrel Chest - no.  Wheeze - no, Crackles - YES BASE, No overt respiratory distress CVS: Normal heart sounds. Murmurs - no Ext: Stigmata of Connective Tissue Disease - no HEENT: Normal upper airway. PEERL +. No post nasal drip        Assessment:       ICD-10-CM   1. COPD, severe (HCC)  J44.9     2. Bibasilar crackles  R09.89          Plan:     Patient Instructions     ICD-10-CM   1. COPD, severe (HCC)  J44.9       Noted history of flareup in September 2024 requiring oxygen but currently stable -Glad you are up-to-date with the flu shot -Last breathing test was in 2017   Plan  - continue trelegy  - continue night o2 -Do spirometry and DLCO in 3 months -Continue colonoscopy evaluation December 2024 -Keep appoint with Dr. Arbutus Ped November 2024  -Timing of next CT scan of the chest by Dr. Arbutus Ped but if not we can get 1 in May  2025    Followup 3 months or sooner if needed 15-minute visit   FOLLOWUP Return in about 3 months (around 10/12/2023) for 15  min visit, copd, with Dr Marchelle Gearing, Face to Face Visit.    SIGNATURE    Dr. Kalman Shan, M.D., F.C.C.P,  Pulmonary and Critical Care Medicine Staff Physician, [facility] Center Director - Interstitial Lung Disease  Program  Pulmonary Fibrosis [facility] at [facility] [place], [place], [address]  Pager: [phone], If no answer or between  15:00h - 7:00h: call [phone] Telephone: [phone]  10:04 AM 07/12/2023

## 2023-07-16 ENCOUNTER — Telehealth: Payer: Self-pay | Admitting: Internal Medicine

## 2023-07-16 NOTE — Telephone Encounter (Signed)
Rescheduled 11/27 appointment due to provider pal, patient has been called and notified of appointment change.

## 2023-07-16 NOTE — Addendum Note (Signed)
Addended by: Flossie Dibble on: 07/16/2023 09:57 AM   Modules accepted: Orders

## 2023-07-17 LAB — BASIC METABOLIC PANEL WITH GFR
BUN/Creatinine Ratio: 26 — ABNORMAL HIGH (ref 10–24)
BUN: 28 mg/dL — ABNORMAL HIGH (ref 8–27)
CO2: 31 mmol/L — ABNORMAL HIGH (ref 20–29)
Calcium: 9 mg/dL (ref 8.6–10.2)
Chloride: 101 mmol/L (ref 96–106)
Creatinine, Ser: 1.08 mg/dL (ref 0.76–1.27)
Glucose: 160 mg/dL — ABNORMAL HIGH (ref 70–99)
Potassium: 4.3 mmol/L (ref 3.5–5.2)
Sodium: 142 mmol/L (ref 134–144)
eGFR: 73 mL/min/1.73

## 2023-07-19 ENCOUNTER — Telehealth: Payer: Self-pay | Admitting: *Deleted

## 2023-07-19 NOTE — Telephone Encounter (Signed)
Informed pt of normal labs. Pt verbalized understanding and had no further questions

## 2023-07-19 NOTE — Telephone Encounter (Signed)
-----   Message from Flossie Dibble sent at 07/18/2023 10:34 AM EDT ----- Labs look good, continue current meds.

## 2023-08-18 ENCOUNTER — Ambulatory Visit: Payer: PPO | Admitting: Internal Medicine

## 2023-08-18 ENCOUNTER — Other Ambulatory Visit: Payer: PPO

## 2023-08-20 ENCOUNTER — Ambulatory Visit (HOSPITAL_COMMUNITY)
Admission: RE | Admit: 2023-08-20 | Discharge: 2023-08-20 | Disposition: A | Discharge status: Home / Self Care | Payer: PPO | Source: Ambulatory Visit | Attending: Physician Assistant | Admitting: Physician Assistant

## 2023-08-20 ENCOUNTER — Encounter (HOSPITAL_COMMUNITY): Payer: Self-pay

## 2023-08-20 DIAGNOSIS — C3491 Malignant neoplasm of unspecified part of right bronchus or lung: Secondary | ICD-10-CM | POA: Diagnosis present

## 2023-08-25 ENCOUNTER — Inpatient Hospital Stay: Payer: PPO

## 2023-08-25 ENCOUNTER — Inpatient Hospital Stay: Payer: PPO | Attending: Internal Medicine

## 2023-08-25 ENCOUNTER — Inpatient Hospital Stay (HOSPITAL_BASED_OUTPATIENT_CLINIC_OR_DEPARTMENT_OTHER): Payer: PPO | Admitting: Internal Medicine

## 2023-08-25 VITALS — BP 135/83 | HR 62 | Temp 97.3°F | Resp 17 | Ht 72.0 in | Wt 207.0 lb

## 2023-08-25 DIAGNOSIS — R590 Localized enlarged lymph nodes: Secondary | ICD-10-CM | POA: Insufficient documentation

## 2023-08-25 DIAGNOSIS — K219 Gastro-esophageal reflux disease without esophagitis: Secondary | ICD-10-CM | POA: Insufficient documentation

## 2023-08-25 DIAGNOSIS — Z79899 Other long term (current) drug therapy: Secondary | ICD-10-CM | POA: Insufficient documentation

## 2023-08-25 DIAGNOSIS — I110 Hypertensive heart disease with heart failure: Secondary | ICD-10-CM | POA: Insufficient documentation

## 2023-08-25 DIAGNOSIS — Z79890 Hormone replacement therapy (postmenopausal): Secondary | ICD-10-CM | POA: Insufficient documentation

## 2023-08-25 DIAGNOSIS — Z8572 Personal history of non-Hodgkin lymphomas: Secondary | ICD-10-CM | POA: Insufficient documentation

## 2023-08-25 DIAGNOSIS — I4891 Unspecified atrial fibrillation: Secondary | ICD-10-CM | POA: Insufficient documentation

## 2023-08-25 DIAGNOSIS — I272 Pulmonary hypertension, unspecified: Secondary | ICD-10-CM | POA: Insufficient documentation

## 2023-08-25 DIAGNOSIS — J449 Chronic obstructive pulmonary disease, unspecified: Secondary | ICD-10-CM | POA: Insufficient documentation

## 2023-08-25 DIAGNOSIS — Z7901 Long term (current) use of anticoagulants: Secondary | ICD-10-CM | POA: Diagnosis not present

## 2023-08-25 DIAGNOSIS — I2510 Atherosclerotic heart disease of native coronary artery without angina pectoris: Secondary | ICD-10-CM | POA: Diagnosis not present

## 2023-08-25 DIAGNOSIS — Z9221 Personal history of antineoplastic chemotherapy: Secondary | ICD-10-CM | POA: Diagnosis not present

## 2023-08-25 DIAGNOSIS — Z452 Encounter for adjustment and management of vascular access device: Secondary | ICD-10-CM | POA: Diagnosis present

## 2023-08-25 DIAGNOSIS — Z85118 Personal history of other malignant neoplasm of bronchus and lung: Secondary | ICD-10-CM | POA: Insufficient documentation

## 2023-08-25 DIAGNOSIS — N400 Enlarged prostate without lower urinary tract symptoms: Secondary | ICD-10-CM | POA: Insufficient documentation

## 2023-08-25 DIAGNOSIS — E7800 Pure hypercholesterolemia, unspecified: Secondary | ICD-10-CM | POA: Diagnosis not present

## 2023-08-25 DIAGNOSIS — R634 Abnormal weight loss: Secondary | ICD-10-CM | POA: Insufficient documentation

## 2023-08-25 DIAGNOSIS — I5032 Chronic diastolic (congestive) heart failure: Secondary | ICD-10-CM | POA: Diagnosis not present

## 2023-08-25 DIAGNOSIS — R161 Splenomegaly, not elsewhere classified: Secondary | ICD-10-CM | POA: Insufficient documentation

## 2023-08-25 DIAGNOSIS — C3490 Malignant neoplasm of unspecified part of unspecified bronchus or lung: Secondary | ICD-10-CM | POA: Diagnosis not present

## 2023-08-25 DIAGNOSIS — C3491 Malignant neoplasm of unspecified part of right bronchus or lung: Secondary | ICD-10-CM

## 2023-08-25 DIAGNOSIS — Z95828 Presence of other vascular implants and grafts: Secondary | ICD-10-CM

## 2023-08-25 DIAGNOSIS — E039 Hypothyroidism, unspecified: Secondary | ICD-10-CM | POA: Diagnosis not present

## 2023-08-25 LAB — CBC WITH DIFFERENTIAL (CANCER CENTER ONLY)
Abs Immature Granulocytes: 0.04 10*3/uL (ref 0.00–0.07)
Basophils Absolute: 0 10*3/uL (ref 0.0–0.1)
Basophils Relative: 0 %
Eosinophils Absolute: 0 10*3/uL (ref 0.0–0.5)
Eosinophils Relative: 0 %
HCT: 35.1 % — ABNORMAL LOW (ref 39.0–52.0)
Hemoglobin: 10.8 g/dL — ABNORMAL LOW (ref 13.0–17.0)
Immature Granulocytes: 1 %
Lymphocytes Relative: 19 %
Lymphs Abs: 1.2 10*3/uL (ref 0.7–4.0)
MCH: 28 pg (ref 26.0–34.0)
MCHC: 30.8 g/dL (ref 30.0–36.0)
MCV: 90.9 fL (ref 80.0–100.0)
Monocytes Absolute: 0.5 10*3/uL (ref 0.1–1.0)
Monocytes Relative: 9 %
Neutro Abs: 4.4 10*3/uL (ref 1.7–7.7)
Neutrophils Relative %: 71 %
Platelet Count: 171 10*3/uL (ref 150–400)
RBC: 3.86 MIL/uL — ABNORMAL LOW (ref 4.22–5.81)
RDW: 15.2 % (ref 11.5–15.5)
WBC Count: 6.2 10*3/uL (ref 4.0–10.5)
nRBC: 0 % (ref 0.0–0.2)

## 2023-08-25 LAB — CMP (CANCER CENTER ONLY)
ALT: 12 U/L (ref 0–44)
AST: 14 U/L — ABNORMAL LOW (ref 15–41)
Albumin: 4.1 g/dL (ref 3.5–5.0)
Alkaline Phosphatase: 90 U/L (ref 38–126)
Anion gap: 3 — ABNORMAL LOW (ref 5–15)
BUN: 39 mg/dL — ABNORMAL HIGH (ref 8–23)
CO2: 29 mmol/L (ref 22–32)
Calcium: 9.2 mg/dL (ref 8.9–10.3)
Chloride: 107 mmol/L (ref 98–111)
Creatinine: 1.05 mg/dL (ref 0.61–1.24)
GFR, Estimated: >60 mL/min (ref >60)
Glucose, Bld: 118 mg/dL — ABNORMAL HIGH (ref 70–99)
Potassium: 5.1 mmol/L (ref 3.5–5.1)
Sodium: 139 mmol/L (ref 135–145)
Total Bilirubin: 0.3 mg/dL (ref <1.2)
Total Protein: 6.4 g/dL — ABNORMAL LOW (ref 6.5–8.1)

## 2023-08-25 MED ORDER — SODIUM CHLORIDE 0.9% FLUSH
10.0000 mL | INTRAVENOUS | Status: DC | PRN
  Administered 2023-08-25: 10 mL via INTRAVENOUS

## 2023-08-25 MED ORDER — HEPARIN SOD (PORK) LOCK FLUSH 100 UNIT/ML IV SOLN
500.0000 [IU] | Freq: Once | INTRAVENOUS | Status: AC | PRN
  Administered 2023-08-25: 500 [IU] via INTRAVENOUS

## 2023-08-25 NOTE — Progress Notes (Signed)
[facility] [phone]   [phone]  OFFICE PROGRESS NOTE  [name], [name] [name] [place] [address]  DIAGNOSIS:  1) stage IA (T1c, N0, M0) non-small cell lung cancer, squamous cell carcinoma diagnosed in January 2021 2) Stage III large B-cell non-Hodgkin lymphoma diagnosed in December 2016 and presented with extensive lymphadenopathy involving the neck, chest, abdomen as well as splenomegaly.   PRIOR THERAPY:  1) Systemic chemotherapy with the CHOP/Rituxan every 3 weeks with Neulasta support. Status post 6 cycles. 2) SBRT to the right upper lobe squamous cell carcinoma under the care of Dr. Roselind Messier in February 2021.[name]  CURRENT THERAPY: Observation.   INTERVAL HISTORY: [name] 72 y.o. male returns to the clinic today for 92-month follow-up visit.Discussed the use of AI scribe software for clinical note transcription with the patient, who gave verbal consent to proceed.  History of Present Illness   The patient, a 72 year old with a history of non-Hodgkin's lymphoma (NHL) and carcinoma, presented for a follow-up visit. He was initially diagnosed with NHL in December 2016 and underwent chemotherapy with CHOP and Rituximab. In 2021, he was diagnosed with Non-small cell lung cancer, squamous cell carcinoma and received radiation therapy.  Over the past five months, the patient has experienced significant weight loss, dropping from 246 pounds to 200 pounds. This weight loss was initially intentional, but was exacerbated by a recent illness that required hospitalization. Despite the dramatic weight loss, the patient reports feeling good and has noticed an improvement in his breathing.  The patient also noted that his magnesium levels have been a significant factor in his overall well-being. When his magnesium was low, he felt unwell, but he has noticed a significant improvement since his levels have been corrected.  At the time of  the visit, the patient denied any current symptoms, including chest pain, cough, nausea, vomiting, diarrhea, and headaches. He reported feeling well overall.  The patient had a scan done recently, but the results were not yet available at the time of the visit. Otherwise, he was scheduled for a follow-up visit in six months.        MEDICAL HISTORY: Past Medical History:  Diagnosis Date   Acute diastolic CHF (congestive heart failure) (HCC) 05/07/2023   Acute on chronic diastolic CHF (congestive heart failure) (HCC) 05/06/2023   Acute on chronic respiratory failure with hypoxia (HCC) 05/06/2023   Aortic atherosclerosis (HCC) 01/26/2023   Arthritis    Atrial fibrillation (HCC) 10/26/2011   BPH (benign prostatic hyperplasia) 2011   BRONCHOPNEUMONIA ORGANISM UNSPECIFIED 06/12/2010         Replacing diagnoses that were inactivated after the 12/21/22 regulatory import   Carpal tunnel syndrome, right upper limb 11/03/2022   Chronic respiratory failure with hypoxia (HCC) 08/27/2015   Congestive heart failure (HCC)    COPD (chronic obstructive pulmonary disease) (HCC)    COPD with acute exacerbation (HCC) 05/26/2023   Coronary artery calcification 01/26/2023   Cubital tunnel syndrome on right 07/09/2023   Depression    DOE (dyspnea on exertion) 04/07/2023   Essential hypertension 06/05/2008   Qualifier: Diagnosis of   By: Truman Hayward Duncan Dull), [name]       GERD (gastroesophageal reflux disease)    Hypothyroidism 03/30/2012   Lesion of ulnar nerve, right upper limb 11/03/2022   Lymphadenopathy syndrome 09/12/2015   Mediastinal adenopathy 09/12/2015   NHL (non-Hodgkin's lymphoma) (HCC) 09/25/2015   Numbness of right hand 06/28/2022   Obesity  Open wound of right hand without foreign body 07/27/2021   Other dysphagia 11/11/2010   Qualifier: Diagnosis of   By: Marchelle Gearing MD, [name]       Polycythemia 01/26/2023   Port catheter in place 08/20/2016   Pulmonary hypertension (HCC)    Pure  hypercholesterolemia 12/03/2011   Recurrent pneumonia 02/10/2012   Routine general medical examination at a health care facility 12/03/2011   Squamous cell carcinoma lung (HCC) 09/26/2010   S/p LUL resection Dr [name] dec 2011      Status post bronchoscopy with biopsy    Stiffness of finger joint of right hand 09/24/2021   Testicular swelling 09/12/2015    ALLERGIES:  is allergic to lipitor [atorvastatin] and omnipaque [iohexol].  MEDICATIONS:  Current Outpatient Medications  Medication Sig Dispense Refill   allopurinol (ZYLOPRIM) 300 MG tablet Take 300 mg by mouth daily.     amLODipine (NORVASC) 5 MG tablet Take 5 mg by mouth daily.     apixaban (ELIQUIS) 5 MG TABS tablet Take 1 tablet (5 mg total) by mouth 2 (two) times daily. 180 tablet 3   betamethasone dipropionate 0.05 % lotion Apply 1 application  topically 2 (two) times daily. To legs for psoriasis     Buprenorphine HCl-Naloxone HCl 8-2 MG FILM Place 0.5-1 Film under the tongue 2 (two) times daily as needed (back pain).     ezetimibe (ZETIA) 10 MG tablet Take 10 mg by mouth daily.     finasteride (PROSCAR) 5 MG tablet Take 5 mg by mouth daily.     furosemide (LASIX) 20 MG tablet Take 1 tablet (20 mg total) by mouth daily. 30 tablet 11   ipratropium-albuterol (DUONEB) 0.5-2.5 (3) MG/3ML SOLN Take 3 mLs by nebulization every 6 (six) hours as needed. 360 mL 1   levothyroxine (SYNTHROID) 175 MCG tablet Take 175 mcg by mouth daily.     LINZESS 290 MCG CAPS capsule Take 290 mcg by mouth daily.     lisinopril (PRINIVIL,ZESTRIL) 10 MG tablet Take 1 tablet (10 mg total) by mouth daily. 30 tablet 11   Magnesium Chloride 64 MG TABS Take 64 mg by mouth daily in the afternoon. 90 tablet 3   metoprolol succinate (TOPROL XL) 25 MG 24 hr tablet Take 1 tablet (25 mg total) by mouth daily. 90 tablet 3   omeprazole (PRILOSEC) 20 MG capsule Take 1 capsule (20 mg total) by mouth 2 (two) times daily.     ondansetron (ZOFRAN-ODT) 4 MG disintegrating  tablet Take 1 tablet (4 mg total) by mouth every 8 (eight) hours as needed. 20 tablet 0   PROAIR HFA 108 (90 Base) MCG/ACT inhaler Inhale 2 puffs into the lungs every 6 (six) hours as needed for wheezing or shortness of breath. 3 Inhaler 1   Prucalopride Succinate (MOTEGRITY) 2 MG TABS Take 1 tablet (2 mg total) by mouth daily. Exp: 06-2024     rosuvastatin (CRESTOR) 10 MG tablet Take 1 tablet (10 mg total) by mouth daily. 90 tablet 3   TRELEGY ELLIPTA 100-62.5-25 MCG/INH AEPB Inhale 1 puff into the lungs daily.     zolpidem (AMBIEN) 10 MG tablet Take 10 mg by mouth at bedtime as needed for sleep.     No current facility-administered medications for this visit.    SURGICAL HISTORY:  Past Surgical History:  Procedure Laterality Date   APPENDECTOMY     BACK SURGERY     CARDIOVERSION  11/27/2011   Procedure: CARDIOVERSION;  Surgeon: Vesta Mixer, MD;  Location:  [facility] ENDOSCOPY;  Service: Cardiovascular;  Laterality: N/A;   COLONOSCOPY     FUDUCIAL PLACEMENT Right 10/13/2019   Procedure: Placement Of Fuducial;  Surgeon: Leslye Peer, MD;  Location: [facility] OR;  Service: Thoracic;  Laterality: Right;  Right Upper Lobe    LEFT HEART CATH AND CORONARY ANGIOGRAPHY N/A 04/12/2023   Procedure: LEFT HEART CATH AND CORONARY ANGIOGRAPHY;  Surgeon: Marykay Lex, MD;  Location: [facility] INVASIVE CV LAB;  Service: Cardiovascular;  Laterality: N/A;   LUNG CANCER SURGERY  2011   UPPER GI ENDOSCOPY     VIDEO BRONCHOSCOPY WITH ENDOBRONCHIAL NAVIGATION N/A 10/13/2019   Procedure: VIDEO BRONCHOSCOPY WITH ENDOBRONCHIAL NAVIGATION;  Surgeon: Leslye Peer, MD;  Location: [facility] OR;  Service: Thoracic;  Laterality: N/A;   VIDEO BRONCHOSCOPY WITH ENDOBRONCHIAL ULTRASOUND N/A 10/13/2019   Procedure: VIDEO BRONCHOSCOPY WITH ENDOBRONCHIAL ULTRASOUND;  Surgeon: Leslye Peer, MD;  Location: [facility] OR;  Service: Thoracic;  Laterality: N/A;    REVIEW OF SYSTEMS:  Constitutional: positive for weight loss Eyes: negative Ears, nose,  mouth, throat, and face: negative Respiratory: negative Cardiovascular: negative Gastrointestinal: negative Genitourinary:negative Integument/breast: negative Hematologic/lymphatic: negative Musculoskeletal:negative Neurological: negative Behavioral/Psych: negative Endocrine: negative Allergic/Immunologic: negative   PHYSICAL EXAMINATION: General appearance: alert, cooperative, appears stated age, fatigued and no distress Head: Normocephalic, without obvious abnormality, atraumatic Neck: no adenopathy, no JVD, supple, symmetrical, trachea midline and thyroid not enlarged, symmetric, no tenderness/mass/nodules Lymph nodes: Cervical, supraclavicular, and axillary nodes normal. Resp: clear to auscultation bilaterally Back: symmetric, no curvature. ROM normal. No CVA tenderness. Cardio: regular rate and rhythm, S1, S2 normal, no murmur, click, rub or gallop GI: soft, non-tender; bowel sounds normal; no masses,  no organomegaly Extremities: extremities normal, atraumatic, no cyanosis or edema  ECOG PERFORMANCE STATUS: 1 - Symptomatic but completely ambulatory  Blood pressure 135/83, pulse 62, temperature (!) 97.3 F (36.3 C), temperature source Temporal, resp. rate 17, height 6' (1.829 m), weight 207 lb (93.9 kg), SpO2 94%.  LABORATORY DATA: Lab Results  Component Value Date   WBC 6.2 08/25/2023   HGB 10.8 (L) 08/25/2023   HCT 35.1 (L) 08/25/2023   MCV 90.9 08/25/2023   PLT 171 08/25/2023      Chemistry      Component Value Date/Time   NA 142 07/16/2023 1001   NA 140 09/07/2017 1322   K 4.3 07/16/2023 1001   K 5.2 No visable hemolysis (H) 09/07/2017 1322   CL 101 07/16/2023 1001   CO2 31 (H) 07/16/2023 1001   CO2 33 (H) 09/07/2017 1322   BUN 28 (H) 07/16/2023 1001   BUN 13.6 09/07/2017 1322   CREATININE 1.08 07/16/2023 1001   CREATININE 1.08 02/11/2023 1042   CREATININE 1.0 09/07/2017 1322      Component Value Date/Time   CALCIUM 9.0 07/16/2023 1001   CALCIUM 9.1  09/07/2017 1322   ALKPHOS 71 05/26/2023 2121   ALKPHOS 92 09/07/2017 1322   AST 16 05/26/2023 2121   AST 13 (L) 02/11/2023 1042   AST 11 09/07/2017 1322   ALT 15 05/26/2023 2121   ALT 18 02/11/2023 1042   ALT 8 09/07/2017 1322   BILITOT 0.2 (L) 05/26/2023 2121   BILITOT 0.5 04/07/2023 1009   BILITOT 0.6 02/11/2023 1042   BILITOT 0.47 09/07/2017 1322       RADIOGRAPHIC STUDIES: No results found.   ASSESSMENT AND PLAN:  This is a very pleasant 72 years old white male with 1) stage IIIA large B-cell non-Hodgkin lymphoma status post 6 cycles of  systemic chemotherapy with CHOP/Rituxan with almost complete response. The patient has been on observation for several years. 2) stage IA (T1c, N0, M0) non-small cell lung cancer, squamous cell carcinoma presented with right upper lobe pulmonary mass.  This was found on repeat imaging studies with CT scan of the chest as well as a PET scan that showed hypermetabolic activity in the right upper lobe lung mass with no evidence for lymphadenopathy or distant metastatic disease. The patient underwent SBRT to the recurrent non-small cell lung cancer, squamous cell carcinoma of the right upper lobe.  He tolerated the procedure well. The patient is currently on observation and he is feeling fine today. He had repeat CT scan of the chest performed recently.  The final report is still pending but I personally and independently reviewed the scan I do not see any clear evidence for disease recurrence or progression but I will wait for the final report for confirmation.    Non-Hodgkin Lymphoma Diagnosed December 2016, treated with CHOP chemotherapy and Rituximab. Recurrence in 2021 treated with chemotherapy and radiation. Recent 46-pound weight loss over five months, initially intentional but exacerbated by illness and hospitalization. Current physical exam unremarkable. Recent scan shows improvement in right lung density, pending radiologist's report. - Await  radiologist's report on recent scan - Schedule follow-up appointment in six months with another scan - Contact patient if scan results are concerning  Weight Loss Significant 46-pound weight loss over five months, initially intentional but furthered by illness and hospitalization. Reports feeling physically well and improved breathing. - Monitor weight and overall health status - Encourage maintaining current weight if stable and healthy  Hypomagnesemia Significant improvement in symptoms with magnesium supplementation. No current symptoms reported. - Continue monitoring magnesium levels - Ensure adequate magnesium intake through diet or supplements  General Health Maintenance No current symptoms such as chest pain, cough, hemoptysis, nausea, vomiting, diarrhea, or headaches. Physical examination unremarkable. - Encourage regular follow-up visits - Maintain a healthy lifestyle and diet.   The patient was advised to call immediately if he has any other concerning symptoms in the interval. The patient voices understanding of current disease status and treatment options and is in agreement with the current care plan. All questions were answered. The patient knows to call the clinic with any problems, questions or concerns. We can certainly see the patient much sooner if necessary. The total time spent in the appointment was 30 minutes.  Disclaimer: This note was dictated with voice recognition software. Similar sounding words can inadvertently be transcribed and may not be corrected upon review.

## 2023-08-26 ENCOUNTER — Encounter: Payer: Self-pay | Admitting: Cardiology

## 2023-08-26 ENCOUNTER — Ambulatory Visit: Payer: PPO | Attending: Cardiology | Admitting: Cardiology

## 2023-08-26 VITALS — BP 108/60 | HR 76 | Ht 72.0 in | Wt 211.2 lb

## 2023-08-26 DIAGNOSIS — I4891 Unspecified atrial fibrillation: Secondary | ICD-10-CM | POA: Diagnosis not present

## 2023-08-26 DIAGNOSIS — E7800 Pure hypercholesterolemia, unspecified: Secondary | ICD-10-CM

## 2023-08-26 DIAGNOSIS — I700 Atherosclerosis of aorta: Secondary | ICD-10-CM

## 2023-08-26 DIAGNOSIS — I5031 Acute diastolic (congestive) heart failure: Secondary | ICD-10-CM | POA: Diagnosis not present

## 2023-08-26 NOTE — Patient Instructions (Signed)
Medication Instructions:  Your physician recommends that you continue on your current medications as directed. Please refer to the Current Medication list given to you today.  *If you need a refill on your cardiac medications before your next appointment, please call your pharmacy*   Lab Work: None If you have labs (blood work) drawn today and your tests are completely normal, you will receive your results only by: MyChart Message (if you have MyChart) OR A paper copy in the mail If you have any lab test that is abnormal or we need to change your treatment, we will call you to review the results.   Testing/Procedures: None   Follow-Up: At [facility], you and your health needs are our priority.  As part of our continuing mission to provide you with exceptional heart care, we have created designated Provider Care Teams.  These Care Teams include your primary Cardiologist (physician) and Advanced Practice Providers (APPs -  Physician Assistants and Nurse Practitioners) who all work together to provide you with the care you need, when you need it.  We recommend signing up for the patient portal called "MyChart".  Sign up information is provided on this After Visit Summary.  MyChart is used to connect with patients for Virtual Visits (Telemedicine).  Patients are able to view lab/test results, encounter notes, upcoming appointments, etc.  Non-urgent messages can be sent to your provider as well.   To learn more about what you can do with MyChart, go to [email or web address].    Your next appointment:   1 year(s)  Provider:   Belva Crome, MD    Other Instructions

## 2023-08-26 NOTE — Progress Notes (Signed)
Cardiology Office Note:    Date:  08/26/2023   ID:  [name], DOB 11-Nov-1950, MRN [id number]  PCP:  [name], PA  Cardiologist:  Garwin Brothers, MD   Referring MD: [name], PA    ASSESSMENT:    1. Acute diastolic CHF (congestive heart failure) (HCC)   2. Aortic atherosclerosis (HCC)   3. Atrial fibrillation, unspecified type (HCC)   4. Pure hypercholesterolemia    PLAN:    In order of problems listed above:  Coronary artery calcification and diastolic congestive heart failure: Secondary prevention stressed with the patient.  Importance of compliance with diet medication stressed and he vocalized understanding. Mixed dyslipidemia: On lipid-lowering medications.  Not at goal.  Diet emphasized.  He is going to get blood work done by primary care and goal LDL must be less than 60. Paroxysmal atrial fibrillation:I discussed with the patient atrial fibrillation, disease process. Management and therapy including rate and rhythm control, anticoagulation benefits and potential risks were discussed extensively with the patient. Patient had multiple questions which were answered to patient's satisfaction. Essential hypertension: Blood pressure stable and diet was emphasized.  Lifestyle modification urged. Patient will be seen in follow-up appointment in 6 months or earlier if the patient has any concerns.   Medication Adjustments/Labs and Tests Ordered: Current medicines are reviewed at length with the patient today.  Concerns regarding medicines are outlined above.  No orders of the defined types were placed in this encounter.  No orders of the defined types were placed in this encounter.    No chief complaint on file.    History of Present Illness:    [name] is a 72 y.o. male.  Patient has past medical history of coronary artery calcification, diastolic congestive heart failure, paroxysmal atrial fibrillation, essential hypertension, COPD and mixed  dyslipidemia.  He takes care of activities of daily living.  No chest pain orthopnea or PND.  At the time of my evaluation, the patient is alert awake oriented and in no distress.  Past Medical History:  Diagnosis Date   Acute diastolic CHF (congestive heart failure) (HCC) 05/07/2023   Acute on chronic diastolic CHF (congestive heart failure) (HCC) 05/06/2023   Acute on chronic respiratory failure with hypoxia (HCC) 05/06/2023   Aortic atherosclerosis (HCC) 01/26/2023   Arthritis    Atrial fibrillation (HCC) 10/26/2011   BPH (benign prostatic hyperplasia) 2011   BRONCHOPNEUMONIA ORGANISM UNSPECIFIED 06/12/2010         Replacing diagnoses that were inactivated after the 12/21/22 regulatory import   Carpal tunnel syndrome, right upper limb 11/03/2022   Chronic respiratory failure with hypoxia (HCC) 08/27/2015   Congestive heart failure (HCC)    COPD (chronic obstructive pulmonary disease) (HCC)    COPD with acute exacerbation (HCC) 05/26/2023   Coronary artery calcification 01/26/2023   Cubital tunnel syndrome on right 07/09/2023   Depression    DOE (dyspnea on exertion) 04/07/2023   Essential hypertension 06/05/2008   Qualifier: Diagnosis of   By: Truman Hayward Duncan Dull), [name]       GERD (gastroesophageal reflux disease)    Hypothyroidism 03/30/2012   Lesion of ulnar nerve, right upper limb 11/03/2022   Lymphadenopathy syndrome 09/12/2015   Mediastinal adenopathy 09/12/2015   NHL (non-Hodgkin's lymphoma) (HCC) 09/25/2015   Numbness of right hand 06/28/2022   Obesity    Open wound of right hand without foreign body 07/27/2021   Other dysphagia 11/11/2010   Qualifier: Diagnosis of   By: Marchelle Gearing MD, Carmin Muskrat  Polycythemia 01/26/2023   Port catheter in place 08/20/2016   Pulmonary hypertension (HCC)    Pure hypercholesterolemia 12/03/2011   Recurrent pneumonia 02/10/2012   Routine general medical examination at a health care facility 12/03/2011   Squamous cell carcinoma lung (HCC)  09/26/2010   S/p LUL resection Dr [name] dec 2011      Status post bronchoscopy with biopsy    Stiffness of finger joint of right hand 09/24/2021   Testicular swelling 09/12/2015    Past Surgical History:  Procedure Laterality Date   APPENDECTOMY     BACK SURGERY     CARDIOVERSION  11/27/2011   Procedure: CARDIOVERSION;  Surgeon: Vesta Mixer, MD;  Location: [facility] ENDOSCOPY;  Service: Cardiovascular;  Laterality: N/A;   COLONOSCOPY     FUDUCIAL PLACEMENT Right 10/13/2019   Procedure: Placement Of Fuducial;  Surgeon: Leslye Peer, MD;  Location: [facility] OR;  Service: Thoracic;  Laterality: Right;  Right Upper Lobe    LEFT HEART CATH AND CORONARY ANGIOGRAPHY N/A 04/12/2023   Procedure: LEFT HEART CATH AND CORONARY ANGIOGRAPHY;  Surgeon: Marykay Lex, MD;  Location: [facility] INVASIVE CV LAB;  Service: Cardiovascular;  Laterality: N/A;   LUNG CANCER SURGERY  2011   UPPER GI ENDOSCOPY     VIDEO BRONCHOSCOPY WITH ENDOBRONCHIAL NAVIGATION N/A 10/13/2019   Procedure: VIDEO BRONCHOSCOPY WITH ENDOBRONCHIAL NAVIGATION;  Surgeon: Leslye Peer, MD;  Location: [facility] OR;  Service: Thoracic;  Laterality: N/A;   VIDEO BRONCHOSCOPY WITH ENDOBRONCHIAL ULTRASOUND N/A 10/13/2019   Procedure: VIDEO BRONCHOSCOPY WITH ENDOBRONCHIAL ULTRASOUND;  Surgeon: Leslye Peer, MD;  Location: [facility] OR;  Service: Thoracic;  Laterality: N/A;    Current Medications: Current Meds  Medication Sig   allopurinol (ZYLOPRIM) 300 MG tablet Take 300 mg by mouth daily.   amLODipine (NORVASC) 5 MG tablet Take 5 mg by mouth daily.   apixaban (ELIQUIS) 5 MG TABS tablet Take 1 tablet (5 mg total) by mouth 2 (two) times daily.   betamethasone dipropionate 0.05 % lotion Apply 1 application  topically 2 (two) times daily. To legs for psoriasis   Buprenorphine HCl-Naloxone HCl 8-2 MG FILM Place 0.5-1 Film under the tongue 2 (two) times daily as needed (back pain).   ezetimibe (ZETIA) 10 MG tablet Take 10 mg by mouth daily.   finasteride  (PROSCAR) 5 MG tablet Take 5 mg by mouth daily.   furosemide (LASIX) 20 MG tablet Take 1 tablet (20 mg total) by mouth daily.   ipratropium-albuterol (DUONEB) 0.5-2.5 (3) MG/3ML SOLN Take 3 mLs by nebulization every 6 (six) hours as needed.   levothyroxine (SYNTHROID) 175 MCG tablet Take 175 mcg by mouth daily.   LINZESS 290 MCG CAPS capsule Take 290 mcg by mouth daily.   lisinopril (PRINIVIL,ZESTRIL) 10 MG tablet Take 1 tablet (10 mg total) by mouth daily.   Magnesium Chloride 64 MG TABS Take 64 mg by mouth daily in the afternoon.   metoprolol succinate (TOPROL XL) 25 MG 24 hr tablet Take 1 tablet (25 mg total) by mouth daily.   omeprazole (PRILOSEC) 20 MG capsule Take 1 capsule (20 mg total) by mouth 2 (two) times daily.   ondansetron (ZOFRAN-ODT) 4 MG disintegrating tablet Take 1 tablet (4 mg total) by mouth every 8 (eight) hours as needed.   PROAIR HFA 108 (90 Base) MCG/ACT inhaler Inhale 2 puffs into the lungs every 6 (six) hours as needed for wheezing or shortness of breath.   Prucalopride Succinate (MOTEGRITY) 2 MG TABS Take 1 tablet (2  mg total) by mouth daily. Exp: 06-2024   TRELEGY ELLIPTA 100-62.5-25 MCG/INH AEPB Inhale 1 puff into the lungs daily.   zolpidem (AMBIEN) 10 MG tablet Take 10 mg by mouth at bedtime as needed for sleep.     Allergies:   Lipitor [atorvastatin] and Omnipaque [iohexol]   Social History   Socioeconomic History   Marital status: Married    Spouse name: Not on file   Number of children: 1   Years of education: Not on file   Highest education level: Not on file  Occupational History   Occupation: truck driver   Occupation: truck driver  Tobacco Use   Smoking status: Former    Current packs/day: 0.00    Average packs/day: 3.0 packs/day for 43.0 years (129.0 ttl pk-yrs)    Types: Cigarettes    Start date: 10/22/1958    Quit date: 10/22/2001    Years since quitting: 21.8   Smokeless tobacco: Never  Vaping Use   Vaping status: Never Used  Substance and  Sexual Activity   Alcohol use: No   Drug use: No   Sexual activity: Yes  Other Topics Concern   Not on file  Social History Narrative   Resides in [place] with his wife.  Has 1 daughter and 1 granddaughter.   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   Social Determinants of Health   Financial Resource Strain: Not on file  Food Insecurity: No Food Insecurity (05/27/2023)   Hunger Vital Sign    Worried About Running Out of Food in the Last Year: Never true    Ran Out of Food in the Last Year: Never true  Transportation Needs: No Transportation Needs (05/27/2023)   PRAPARE - Administrator, Civil Service (Medical): No    Lack of Transportation (Non-Medical): No  Physical Activity: Not on file  Stress: Not on file  Social Connections: Not on file     Family History: The patient's family history includes Heart disease in his father. There is no history of Cancer, Diabetes, Stroke, Hypertension, or Kidney disease.  ROS:   Please see the history of present illness.    All other systems reviewed and are negative.  EKGs/Labs/Other Studies Reviewed:    The following studies were reviewed today: I discussed my findings with the patient at length.   Recent Labs: 05/25/2023: NT-Pro BNP 2,365 05/26/2023: B Natriuretic Peptide 319.7 05/27/2023: TSH 0.237 05/28/2023: Magnesium 2.0 08/25/2023: ALT 12; BUN 39; Creatinine 1.05; Hemoglobin 10.8; Platelet Count 171; Potassium 5.1; Sodium 139  Recent Lipid Panel    Component Value Date/Time   CHOL 169 04/07/2023 1009   TRIG 65 04/07/2023 1009   HDL 40 04/07/2023 1009   CHOLHDL 4.2 04/07/2023 1009   CHOLHDL 5 12/03/2011 1024   VLDL 17.0 12/03/2011 1024   LDLCALC 116 (H) 04/07/2023 1009   LDLDIRECT 99 05/18/2023 1548   LDLDIRECT 205.4 12/03/2011 1024    Physical Exam:    VS:  BP 108/60   Pulse 76   Ht 6' (1.829 m)   Wt 211 lb 3.2 oz (95.8 kg)   SpO2 93%   BMI 28.64 kg/m     Wt Readings from  Last 3 Encounters:  08/26/23 211 lb 3.2 oz (95.8 kg)  08/25/23 207 lb (93.9 kg)  07/12/23 210 lb 9.6 oz (95.5 kg)     GEN: Patient is in no acute distress HEENT: Normal NECK: No JVD; No carotid bruits LYMPHATICS: No lymphadenopathy  CARDIAC: Hear sounds regular, 2/6 systolic murmur at the apex. RESPIRATORY:  Clear to auscultation without rales, wheezing or rhonchi  ABDOMEN: Soft, non-tender, non-distended MUSCULOSKELETAL:  No edema; No deformity  SKIN: Warm and dry NEUROLOGIC:  Alert and oriented x 3 PSYCHIATRIC:  Normal affect   Signed, Garwin Brothers, MD  08/26/2023 9:45 AM    [facility]

## 2023-09-01 ENCOUNTER — Encounter (HOSPITAL_COMMUNITY): Payer: Self-pay | Admitting: Gastroenterology

## 2023-09-01 NOTE — Progress Notes (Signed)
Pre op call eval Name:[facility] Cardiologist-Revankar MD PulmonolgistMarchelle Gearing MD  EKG-05/27/23 Echo-02/17/23 Cath-04/12/23 Stress-02/17/23 ICD/PM- n/a Blood thinner-Eliquis 2 day hold GLP-1-n/a  Hx:CHF, CAD, HTN, COPD, Lung CA, Pulm HTN, Afib. Patient last saw cardiology 08/26/23 reports no new issues, due to see 6 months. Last visit with pulm 10/21, due to see 3 months, no new issues, continues with 2l02 at night. No assistive devices needed.  Anesthesia Review: Yes

## 2023-09-07 ENCOUNTER — Ambulatory Visit (HOSPITAL_COMMUNITY): Payer: PPO | Admitting: Anesthesiology

## 2023-09-07 ENCOUNTER — Other Ambulatory Visit: Payer: Self-pay

## 2023-09-07 ENCOUNTER — Ambulatory Visit (HOSPITAL_COMMUNITY)
Admission: RE | Admit: 2023-09-07 | Discharge: 2023-09-07 | Disposition: A | Discharge status: Home / Self Care | Payer: PPO | Attending: Gastroenterology | Admitting: Gastroenterology

## 2023-09-07 ENCOUNTER — Encounter (HOSPITAL_COMMUNITY): Payer: Self-pay | Admitting: Gastroenterology

## 2023-09-07 ENCOUNTER — Encounter (HOSPITAL_COMMUNITY)
Admission: RE | Disposition: A | Discharge status: Home / Self Care | Payer: Self-pay | Source: Home / Self Care | Attending: Gastroenterology

## 2023-09-07 ENCOUNTER — Ambulatory Visit (HOSPITAL_BASED_OUTPATIENT_CLINIC_OR_DEPARTMENT_OTHER): Payer: PPO | Admitting: Anesthesiology

## 2023-09-07 DIAGNOSIS — D649 Anemia, unspecified: Secondary | ICD-10-CM | POA: Diagnosis not present

## 2023-09-07 DIAGNOSIS — Z8572 Personal history of non-Hodgkin lymphomas: Secondary | ICD-10-CM | POA: Diagnosis not present

## 2023-09-07 DIAGNOSIS — D122 Benign neoplasm of ascending colon: Secondary | ICD-10-CM | POA: Diagnosis not present

## 2023-09-07 DIAGNOSIS — D126 Benign neoplasm of colon, unspecified: Secondary | ICD-10-CM

## 2023-09-07 DIAGNOSIS — Z87891 Personal history of nicotine dependence: Secondary | ICD-10-CM | POA: Diagnosis not present

## 2023-09-07 DIAGNOSIS — M1990 Unspecified osteoarthritis, unspecified site: Secondary | ICD-10-CM | POA: Insufficient documentation

## 2023-09-07 DIAGNOSIS — K648 Other hemorrhoids: Secondary | ICD-10-CM | POA: Insufficient documentation

## 2023-09-07 DIAGNOSIS — R933 Abnormal findings on diagnostic imaging of other parts of digestive tract: Secondary | ICD-10-CM

## 2023-09-07 DIAGNOSIS — D123 Benign neoplasm of transverse colon: Secondary | ICD-10-CM | POA: Diagnosis not present

## 2023-09-07 DIAGNOSIS — F32A Depression, unspecified: Secondary | ICD-10-CM | POA: Diagnosis not present

## 2023-09-07 DIAGNOSIS — I4891 Unspecified atrial fibrillation: Secondary | ICD-10-CM | POA: Insufficient documentation

## 2023-09-07 DIAGNOSIS — E039 Hypothyroidism, unspecified: Secondary | ICD-10-CM | POA: Insufficient documentation

## 2023-09-07 DIAGNOSIS — K219 Gastro-esophageal reflux disease without esophagitis: Secondary | ICD-10-CM | POA: Diagnosis not present

## 2023-09-07 DIAGNOSIS — D120 Benign neoplasm of cecum: Secondary | ICD-10-CM

## 2023-09-07 DIAGNOSIS — I272 Pulmonary hypertension, unspecified: Secondary | ICD-10-CM | POA: Diagnosis not present

## 2023-09-07 DIAGNOSIS — J449 Chronic obstructive pulmonary disease, unspecified: Secondary | ICD-10-CM | POA: Diagnosis not present

## 2023-09-07 DIAGNOSIS — I517 Cardiomegaly: Secondary | ICD-10-CM | POA: Diagnosis not present

## 2023-09-07 DIAGNOSIS — D127 Benign neoplasm of rectosigmoid junction: Secondary | ICD-10-CM | POA: Diagnosis not present

## 2023-09-07 DIAGNOSIS — K635 Polyp of colon: Secondary | ICD-10-CM | POA: Diagnosis not present

## 2023-09-07 DIAGNOSIS — Z85118 Personal history of other malignant neoplasm of bronchus and lung: Secondary | ICD-10-CM | POA: Diagnosis not present

## 2023-09-07 DIAGNOSIS — K5730 Diverticulosis of large intestine without perforation or abscess without bleeding: Secondary | ICD-10-CM | POA: Diagnosis not present

## 2023-09-07 DIAGNOSIS — T184XXA Foreign body in colon, initial encounter: Secondary | ICD-10-CM

## 2023-09-07 DIAGNOSIS — I10 Essential (primary) hypertension: Secondary | ICD-10-CM | POA: Diagnosis not present

## 2023-09-07 HISTORY — DX: Abnormal findings on diagnostic imaging of other parts of digestive tract: R93.3

## 2023-09-07 HISTORY — PX: COLONOSCOPY WITH PROPOFOL: SHX5780

## 2023-09-07 HISTORY — PX: POLYPECTOMY: SHX5525

## 2023-09-07 HISTORY — DX: Benign neoplasm of colon, unspecified: D12.6

## 2023-09-07 SURGERY — COLONOSCOPY WITH PROPOFOL
Anesthesia: Monitor Anesthesia Care

## 2023-09-07 MED ORDER — SODIUM CHLORIDE 0.9 % IV SOLN: INTRAVENOUS | Status: DC | PRN

## 2023-09-07 MED ORDER — PROPOFOL 10 MG/ML IV BOLUS
INTRAVENOUS | Status: DC | PRN
  Administered 2023-09-07 (×2): 30 mg via INTRAVENOUS

## 2023-09-07 MED ORDER — PROPOFOL 500 MG/50ML IV EMUL
INTRAVENOUS | Status: DC | PRN
  Administered 2023-09-07: 125 ug/kg/min via INTRAVENOUS

## 2023-09-07 MED ORDER — PHENYLEPHRINE 80 MCG/ML (10ML) SYRINGE FOR IV PUSH (FOR BLOOD PRESSURE SUPPORT)
PREFILLED_SYRINGE | INTRAVENOUS | Status: DC | PRN
  Administered 2023-09-07: 160 ug via INTRAVENOUS
  Administered 2023-09-07: 120 ug via INTRAVENOUS

## 2023-09-07 MED ORDER — PROPOFOL 500 MG/50ML IV EMUL
INTRAVENOUS | Status: AC
  Filled 2023-09-07: qty 50

## 2023-09-07 SURGICAL SUPPLY — 21 items
ELECT REM PT RETURN 9FT ADLT (ELECTROSURGICAL)
ELECTRODE REM PT RTRN 9FT ADLT (ELECTROSURGICAL) IMPLANT
FCP BXJMBJMB 240X2.8X (CUTTING FORCEPS)
FLOOR PAD 36X40 (MISCELLANEOUS) ×2
FORCEPS BIOP RAD 4 LRG CAP 4 (CUTTING FORCEPS) IMPLANT
FORCEPS BIOP RJ4 240 W/NDL (CUTTING FORCEPS)
FORCEPS BXJMBJMB 240X2.8X (CUTTING FORCEPS) IMPLANT
INJECTOR/SNARE I SNARE (MISCELLANEOUS) IMPLANT
LUBRICANT JELLY 4.5OZ STERILE (MISCELLANEOUS) IMPLANT
MANIFOLD NEPTUNE II (INSTRUMENTS) IMPLANT
NDL SCLEROTHERAPY 25GX240 (NEEDLE) IMPLANT
NEEDLE SCLEROTHERAPY 25GX240 (NEEDLE) IMPLANT
PAD FLOOR 36X40 (MISCELLANEOUS) ×3 IMPLANT
PROBE APC STR FIRE (PROBE) IMPLANT
PROBE INJECTION GOLD 7FR (MISCELLANEOUS) IMPLANT
SNARE ROTATE MED OVAL 20MM (MISCELLANEOUS) IMPLANT
SYR 50ML LL SCALE MARK (SYRINGE) IMPLANT
TRAP SPECIMEN MUCOUS 40CC (MISCELLANEOUS) IMPLANT
TUBING ENDO SMARTCAP PENTAX (MISCELLANEOUS) IMPLANT
TUBING IRRIGATION ENDOGATOR (MISCELLANEOUS) ×3 IMPLANT
WATER STERILE IRR 1000ML POUR (IV SOLUTION) IMPLANT

## 2023-09-07 NOTE — Anesthesia Postprocedure Evaluation (Signed)
Anesthesia Post Note  Patient: [name]  Procedure(s) Performed: COLONOSCOPY WITH PROPOFOL POLYPECTOMY     Patient location during evaluation: PACU Anesthesia Type: MAC Level of consciousness: awake and alert Pain management: pain level controlled Vital Signs Assessment: post-procedure vital signs reviewed and stable Respiratory status: spontaneous breathing, nonlabored ventilation and respiratory function stable Cardiovascular status: stable and blood pressure returned to baseline Anesthetic complications: no   No notable events documented.  Last Vitals:  Vitals:   09/07/23 1040 09/07/23 1050  BP: 116/62 130/61  Pulse: 73 73  Resp: 19 16  Temp:    SpO2: 99% 97%    Last Pain:  Vitals:   09/07/23 1050  TempSrc:   PainSc: 0-No pain                 Beryle Lathe

## 2023-09-07 NOTE — Op Note (Signed)
[facility] Patient Name: [name] Procedure Date: 09/07/2023 MRN: [id number] Attending MD: Willaim Rayas. Adela Lank , MD, [id number] Date of Birth: 05-31-1951 CSN: [id number] Age: 72 Admit Type: Outpatient Procedure:                Colonoscopy Indications:              Abnormal PET scan of the GI tract - history of both                            lung cancer and lymphoma s/p treatment, PET scan in                            May showed uptake in the cecum - colonoscopy to                            further evaluate Providers:                Viviann Spare P. Adela Lank, MD, Rogue Jury, RN, Norman Clay, RN, Kandice Robinsons, Technician Referring MD:              Medicines:                Monitored Anesthesia Care Complications:            No immediate complications. Estimated blood loss:                            Minimal. Estimated Blood Loss:     Estimated blood loss was minimal. Procedure:                Pre-Anesthesia Assessment:                           - Prior to the procedure, a History and Physical                            was performed, and patient medications and                            allergies were reviewed. The patient's tolerance of                            previous anesthesia was also reviewed. The risks                            and benefits of the procedure and the sedation                            options and risks were discussed with the patient.                            All questions were answered, and informed consent  was obtained. Prior Anticoagulants: The patient has                            taken Eliquis (apixaban), last dose was 2 days                            prior to procedure. ASA Grade Assessment: III - A                            patient with severe systemic disease. After                            reviewing the risks and benefits, the patient was                             deemed in satisfactory condition to undergo the                            procedure.                           After obtaining informed consent, the colonoscope                            was passed under direct vision. Throughout the                            procedure, the patient's blood pressure, pulse, and                            oxygen saturations were monitored continuously. The                            CF-HQ190L ([id number]) Olympus colonoscope was                            introduced through the anus and advanced to the the                            terminal ileum, with identification of the                            appendiceal orifice and IC valve. The colonoscopy                            was performed without difficulty. The patient                            tolerated the procedure well. The quality of the                            bowel preparation was adequate. The terminal ileum,  ileocecal valve, appendiceal orifice, and rectum                            were photographed. Scope In: 9:54:52 AM Scope Out: 10:24:59 AM Scope Withdrawal Time: 0 hours 24 minutes 24 seconds  Total Procedure Duration: 0 hours 30 minutes 7 seconds  Findings:      The perianal and digital rectal examinations were normal.      The terminal ileum appeared normal.      Two sessile polyps were found in the cecum. The polyps were 2 to 4 mm in       size. These polyps were removed with a cold snare. Resection and       retrieval were complete.      Two sessile polyps were found in the ascending colon. The polyps were 3       mm in size. These polyps were removed with a cold snare. Resection and       retrieval were complete.      A 4 mm polyp was found in the transverse colon. The polyp was flat. The       polyp was removed with a cold snare. Resection and retrieval were       complete.      A diminutive polyp was found in the recto-sigmoid colon. The polyp was        sessile. The polyp was removed with a cold snare. Resection and       retrieval were complete.      Multiple small-mouthed diverticula were found in the sigmoid colon.      Internal hemorrhoids were found during retroflexion.      There was residual vegetable matter and seeds in dependant portions of       the colon, worst in the cecum. Several minutes spent clearing this area       - mostly the entire cecum was able to be cleared but one focal area       could not, few cms in size - area washed extensively and nothing obvious       noted in regards to PET scan but small or flat lesions may not have been       appreciated in some areas. The scope clogged multiple times with seeds /       vegetable matter in the right colon which prolonged the exam. The exam       was otherwise without abnormality. Impression:               - The examined portion of the ileum was normal.                           - Two 2 to 4 mm polyps in the cecum, removed with a                            cold snare. Resected and retrieved.                           - Two 3 mm polyps in the ascending colon, removed                            with a cold snare. Resected and retrieved.                           -  One 4 mm polyp in the transverse colon, removed                            with a cold snare. Resected and retrieved.                           - One diminutive polyp at the recto-sigmoid colon,                            removed with a cold snare. Resected and retrieved.                           - Diverticulosis in the sigmoid colon.                           - Internal hemorrhoids.                           - Residual vegetable matter and seeds noted, worst                            in right colon - could not be completely cleared in                            a small portion of the cecum but no abnormalities                            noted to cause PET findings. Small or flat lesions                             may not have been appreciated in some portions of                            the right colon due to prep.                           - The examination was otherwise normal. Moderate Sedation:      No moderate sedation, case performed with MAC Recommendation:           - Patient has a contact number available for                            emergencies. The signs and symptoms of potential                            delayed complications were discussed with the                            patient. Return to normal activities tomorrow.                            Written discharge instructions were provided to the  patient.                           - Resume previous diet.                           - Continue present medications.                           - Resume Eliquis tomorrow.                           - Await pathology results. Procedure Code(s):        --- Professional ---                           [id number], Colonoscopy, flexible; with removal of                            tumor(s), polyp(s), or other lesion(s) by snare                            technique Diagnosis Code(s):        --- Professional ---                           K64.8, Other hemorrhoids                           D12.0, Benign neoplasm of cecum                           D12.2, Benign neoplasm of ascending colon                           D12.3, Benign neoplasm of transverse colon (hepatic                            flexure or splenic flexure)                           D12.7, Benign neoplasm of rectosigmoid junction                           K57.30, Diverticulosis of large intestine without                            perforation or abscess without bleeding                           R93.3, Abnormal findings on diagnostic imaging of                            other parts of digestive tract CPT copyright 2022 American [employer] Association. All rights reserved. The codes documented in this report are  preliminary and upon coder review may  be revised to meet current compliance requirements. Viviann Spare P. Jenelle Drennon, MD 09/07/2023 10:44:25 AM This report has been signed electronically.  Number of Addenda: 0

## 2023-09-07 NOTE — H&P (Signed)
[facility] History and Physical   Primary Care Physician:  [name], PA   Reason for Procedure:   Abnormal PET scan / imaging of the colon  Plan:    colonoscopy     HPI: [name] is a 72 y.o. male  here for colonoscopy to evaluate PET scan findings which suggested some abnormal uptake in the cecum (this was done at the end of May, seen in the office to discuss this in October). Last colonoscopy 8 years or so ago with Dr. Charm Barges. Case done at the hospital for anesthesia support as he takes supplemental oxygen at baseline. He also is on Eliquis for history of Afibb, has held that for 2 days. His malignancy is otherwise in remission.  He denies any complaints today, states he otherwise feels well and that the prep worked well to clear him out. I have discussed risks / benefits of colonoscopy and anesthesia with him and he wants to proceed. Further recommendations pending results.   I have discussed risks / benefits of anesthesia and endoscopic procedure with Carley Hammed and they wish to proceed with the exams as outlined today.    Past Medical History:  Diagnosis Date   Acute diastolic CHF (congestive heart failure) (HCC) 05/07/2023   Acute on chronic diastolic CHF (congestive heart failure) (HCC) 05/06/2023   Acute on chronic respiratory failure with hypoxia (HCC) 05/06/2023   Aortic atherosclerosis (HCC) 01/26/2023   Arthritis    Atrial fibrillation (HCC) 10/26/2011   BPH (benign prostatic hyperplasia) 2011   BRONCHOPNEUMONIA ORGANISM UNSPECIFIED 06/12/2010         Replacing diagnoses that were inactivated after the 12/21/22 regulatory import   Carpal tunnel syndrome, right upper limb 11/03/2022   Chronic respiratory failure with hypoxia (HCC) 08/27/2015   Congestive heart failure (HCC)    COPD (chronic obstructive pulmonary disease) (HCC)    COPD with acute exacerbation (HCC) 05/26/2023   Coronary artery calcification 01/26/2023   Cubital tunnel  syndrome on right 07/09/2023   Depression    DOE (dyspnea on exertion) 04/07/2023   Essential hypertension 06/05/2008   Qualifier: Diagnosis of   By: Truman Hayward Duncan Dull), [name]       GERD (gastroesophageal reflux disease)    Hypothyroidism 03/30/2012   Lesion of ulnar nerve, right upper limb 11/03/2022   Lymphadenopathy syndrome 09/12/2015   Mediastinal adenopathy 09/12/2015   NHL (non-Hodgkin's lymphoma) (HCC) 09/25/2015   Numbness of right hand 06/28/2022   Obesity    Open wound of right hand without foreign body 07/27/2021   Other dysphagia 11/11/2010   Qualifier: Diagnosis of   By: Marchelle Gearing MD, [name]       Polycythemia 01/26/2023   Port catheter in place 08/20/2016   Pulmonary hypertension (HCC)    Pure hypercholesterolemia 12/03/2011   Recurrent pneumonia 02/10/2012   Routine general medical examination at a health care facility 12/03/2011   Squamous cell carcinoma lung (HCC) 09/26/2010   S/p LUL resection Dr [name] dec 2011      Status post bronchoscopy with biopsy    Stiffness of finger joint of right hand 09/24/2021   Testicular swelling 09/12/2015    Past Surgical History:  Procedure Laterality Date   APPENDECTOMY     BACK SURGERY     CARDIOVERSION  11/27/2011   Procedure: CARDIOVERSION;  Surgeon: Vesta Mixer, MD;  Location: [facility] ENDOSCOPY;  Service: Cardiovascular;  Laterality: N/A;   COLONOSCOPY     FUDUCIAL PLACEMENT Right 10/13/2019   Procedure: Placement Of  Fuducial;  Surgeon: Leslye Peer, MD;  Location: [facility] OR;  Service: Thoracic;  Laterality: Right;  Right Upper Lobe    LEFT HEART CATH AND CORONARY ANGIOGRAPHY N/A 04/12/2023   Procedure: LEFT HEART CATH AND CORONARY ANGIOGRAPHY;  Surgeon: Marykay Lex, MD;  Location: [facility] INVASIVE CV LAB;  Service: Cardiovascular;  Laterality: N/A;   LUNG CANCER SURGERY  2011   UPPER GI ENDOSCOPY     VIDEO BRONCHOSCOPY WITH ENDOBRONCHIAL NAVIGATION N/A 10/13/2019   Procedure: VIDEO BRONCHOSCOPY WITH ENDOBRONCHIAL  NAVIGATION;  Surgeon: Leslye Peer, MD;  Location: [facility] OR;  Service: Thoracic;  Laterality: N/A;   VIDEO BRONCHOSCOPY WITH ENDOBRONCHIAL ULTRASOUND N/A 10/13/2019   Procedure: VIDEO BRONCHOSCOPY WITH ENDOBRONCHIAL ULTRASOUND;  Surgeon: Leslye Peer, MD;  Location: [facility] OR;  Service: Thoracic;  Laterality: N/A;    Prior to Admission medications   Medication Sig Start Date End Date Taking? Authorizing Provider  allopurinol (ZYLOPRIM) 300 MG tablet Take 300 mg by mouth daily. 01/15/17  Yes [provider]  amLODipine (NORVASC) 5 MG tablet Take 5 mg by mouth daily. 01/21/20  Yes [provider]  Buprenorphine HCl-Naloxone HCl 8-2 MG FILM Place 0.5-1 Film under the tongue 2 (two) times daily as needed (back pain). 03/24/23  Yes [provider]  ezetimibe (ZETIA) 10 MG tablet Take 10 mg by mouth daily. 02/09/23  Yes [provider]  finasteride (PROSCAR) 5 MG tablet Take 5 mg by mouth daily. 03/13/19  Yes [provider]  furosemide (LASIX) 20 MG tablet Take 1 tablet (20 mg total) by mouth daily. 04/12/23 04/11/24 Yes Marykay Lex, MD  levothyroxine (SYNTHROID) 175 MCG tablet Take 175 mcg by mouth daily. 10/29/22  Yes [provider]  LINZESS 290 MCG CAPS capsule Take 290 mcg by mouth daily. 01/19/23  Yes [provider]  lisinopril (PRINIVIL,ZESTRIL) 10 MG tablet Take 1 tablet (10 mg total) by mouth daily. 12/29/11  Yes Nahser, Deloris Ping, MD  metoprolol succinate (TOPROL XL) 25 MG 24 hr tablet Take 1 tablet (25 mg total) by mouth daily. 02/08/23  Yes Revankar, Aundra Dubin, MD  omeprazole (PRILOSEC) 20 MG capsule Take 1 capsule (20 mg total) by mouth 2 (two) times daily. 10/13/19  Yes Leslye Peer, MD  PROAIR HFA 108 [phone] Base) MCG/ACT inhaler Inhale 2 puffs into the lungs every 6 (six) hours as needed for wheezing or shortness of breath. 10/02/17  Yes Albertine Grates, MD  Prucalopride Succinate (MOTEGRITY) 2 MG TABS Take 1 tablet (2 mg total) by mouth daily.  Exp: [id number] 06/22/23 09/20/23 Yes Jaylean Buenaventura, Willaim Rayas, MD  TRELEGY ELLIPTA 100-62.5-25 MCG/INH AEPB Inhale 1 puff into the lungs daily. 05/08/19  Yes [provider]  apixaban (ELIQUIS) 5 MG TABS tablet Take 1 tablet (5 mg total) by mouth 2 (two) times daily. 02/02/23 01/28/24  Revankar, Aundra Dubin, MD  betamethasone dipropionate 0.05 % lotion Apply 1 application  topically 2 (two) times daily. To legs for psoriasis    [provider]  ipratropium-albuterol (DUONEB) 0.5-2.5 (3) MG/3ML SOLN Take 3 mLs by nebulization every 6 (six) hours as needed. 05/10/23   Meredeth Ide, MD  Magnesium Chloride 64 MG TABS Take 64 mg by mouth daily in the afternoon. 05/19/23   Flossie Dibble, NP  ondansetron (ZOFRAN-ODT) 4 MG disintegrating tablet Take 1 tablet (4 mg total) by mouth every 8 (eight) hours as needed. 05/17/23   Tomi Bamberger, PA-C  rosuvastatin (CRESTOR) 10 MG tablet Take 1 tablet (  10 mg total) by mouth daily. 04/13/23 07/12/23  Revankar, Aundra Dubin, MD  zolpidem (AMBIEN) 10 MG tablet Take 10 mg by mouth at bedtime as needed for sleep. 03/24/23   [provider]    No current facility-administered medications for this encounter.    Allergies as of 06/22/2023 - Review Complete 06/22/2023  Allergen Reaction Noted   Lipitor [atorvastatin] Other (See Comments) 05/06/2023   Omnipaque [iohexol] Itching and Other (See Comments) 06/14/2020    Family History  Problem Relation Age of Onset   Heart disease Father    Cancer Neg Hx    Diabetes Neg Hx    Stroke Neg Hx    Hypertension Neg Hx    Kidney disease Neg Hx     Social History   Socioeconomic History   Marital status: Married    Spouse name: Not on file   Number of children: 1   Years of education: Not on file   Highest education level: Not on file  Occupational History   Occupation: truck driver   Occupation: truck driver  Tobacco Use   Smoking status: Former    Current packs/day: 0.00    Average packs/day: 3.0  packs/day for 43.0 years (129.0 ttl pk-yrs)    Types: Cigarettes    Start date: 10/22/1958    Quit date: 10/22/2001    Years since quitting: 21.8   Smokeless tobacco: Never  Vaping Use   Vaping status: Never Used  Substance and Sexual Activity   Alcohol use: No   Drug use: No   Sexual activity: Yes  Other Topics Concern   Not on file  Social History Narrative   Resides in [place] with his wife.  Has 1 daughter and 1 granddaughter.   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   Social Drivers of Corporate investment banker Strain: Not on file  Food Insecurity: No Food Insecurity (05/27/2023)   Hunger Vital Sign    Worried About Running Out of Food in the Last Year: Never true    Ran Out of Food in the Last Year: Never true  Transportation Needs: No Transportation Needs (05/27/2023)   PRAPARE - Administrator, Civil Service (Medical): No    Lack of Transportation (Non-Medical): No  Physical Activity: Not on file  Stress: Not on file  Social Connections: Not on file  Intimate Partner Violence: Not At Risk (05/27/2023)   Humiliation, Afraid, Rape, and Kick questionnaire    Fear of Current or Ex-Partner: No    Emotionally Abused: No    Physically Abused: No    Sexually Abused: No    Review of Systems: All other review of systems negative except as mentioned in the HPI.  Lab Results  Component Value Date   WBC 6.2 08/25/2023   HGB 10.8 (L) 08/25/2023   HCT 35.1 (L) 08/25/2023   MCV 90.9 08/25/2023   PLT 171 08/25/2023    Lab Results  Component Value Date   NA 139 08/25/2023   CL 107 08/25/2023   K 5.1 08/25/2023   CO2 29 08/25/2023   BUN 39 (H) 08/25/2023   CREATININE 1.05 08/25/2023   GFRNONAA >60 08/25/2023   CALCIUM 9.2 08/25/2023   PHOS 2.8 05/28/2023   ALBUMIN 4.1 08/25/2023   GLUCOSE 118 (H) 08/25/2023    Lab Results  Component Value Date   ALT 12 08/25/2023   AST 14 (L) 08/25/2023   ALKPHOS 90 08/25/2023   BILITOT  0.3 08/25/2023     Physical Exam: Vital signs BP 138/66   Pulse 72   Temp 97.9 F (36.6 C) (Tympanic)   Resp 18   SpO2 99%   General:   Alert,  Well-developed, pleasant and cooperative in NAD Lungs:  Clear throughout to auscultation.   Heart:  Regular rate and rhythm Abdomen:  Soft, nontender and nondistended.   Neuro/Psych:  Alert and cooperative. Normal mood and affect. A and O x 3  Harlin Rain, MD [facility]

## 2023-09-07 NOTE — Transfer of Care (Signed)
Immediate Anesthesia Transfer of Care Note  Patient: Dickie A Menning  Procedure(s) Performed: COLONOSCOPY WITH PROPOFOL POLYPECTOMY  Patient Location: Endoscopy Unit  Anesthesia Type:MAC  Level of Consciousness: awake and patient cooperative  Airway & Oxygen Therapy: Patient Spontanous Breathing and Patient connected to face mask  Post-op Assessment: Report given to RN and Post -op Vital signs reviewed and stable  Post vital signs: Reviewed and stable  Last Vitals:  Vitals Value Taken Time  BP    Temp    Pulse 73 09/07/23 1031  Resp 25 09/07/23 1031  SpO2 99 % 09/07/23 1031  Vitals shown include unfiled device data.  Last Pain:  Vitals:   09/07/23 0903  TempSrc: Tympanic  PainSc: 0-No pain         Complications: No notable events documented.

## 2023-09-07 NOTE — Anesthesia Procedure Notes (Signed)
Procedure Name: MAC Date/Time: 09/07/2023 9:50 AM  Performed by: Vanessa Hardy, CRNAPre-anesthesia Checklist: Patient identified, Emergency Drugs available, Suction available and Patient being monitored Patient Re-evaluated:Patient Re-evaluated prior to induction Oxygen Delivery Method: Nasal cannula

## 2023-09-07 NOTE — Anesthesia Preprocedure Evaluation (Addendum)
Anesthesia Evaluation  Patient identified by MRN, date of birth, ID band Patient awake    Reviewed: Allergy & Precautions, NPO status , Patient's Chart, lab work & pertinent test results, reviewed documented beta blocker date and time   History of Anesthesia Complications Negative for: history of anesthetic complications  Airway Mallampati: II  TM Distance: >3 FB Neck ROM: Limited    Dental  (+) Dental Advisory Given, Chipped   Pulmonary COPD, former smoker  SCC lung    Pulmonary exam normal        Cardiovascular hypertension, Pt. on medications and Pt. on home beta blockers pulmonary hypertension+ CAD  + dysrhythmias Atrial Fibrillation  Rhythm:Irregular Rate:Normal   '24 Cath - Angiographically Normal Coronaries and a Codominant System. Hyperdynamic LV in setting of A-fib RVR with severely elevated LVEDP of 27-29 mmHg suggestive of A-fib related Diastolic Heart Failure   '24 TTE - EF 60 to 65%. There is moderate concentric left ventricular hypertrophy. Left atrial size was mildly dilated. Right atrial size was mildly dilated. There is mild dilatation of the aortic root, measuring 38 mm.      Neuro/Psych  PSYCHIATRIC DISORDERS  Depression    negative neurological ROS     GI/Hepatic Neg liver ROS,GERD  Medicated and Controlled,,  Endo/Other  Hypothyroidism    Renal/GU negative Renal ROS     Musculoskeletal  (+) Arthritis ,    Abdominal   Peds  Hematology  (+) Blood dyscrasia, anemia  On eliquis    Anesthesia Other Findings On suboxone   Reproductive/Obstetrics                             Anesthesia Physical Anesthesia Plan  ASA: 3  Anesthesia Plan: MAC   Post-op Pain Management: Minimal or no pain anticipated   Induction:   PONV Risk Score and Plan: 1 and Propofol infusion and Treatment may vary due to age or medical condition  Airway Management Planned: Natural Airway  and Simple Face Mask  Additional Equipment: None  Intra-op Plan:   Post-operative Plan:   Informed Consent: I have reviewed the patients History and Physical, chart, labs and discussed the procedure including the risks, benefits and alternatives for the proposed anesthesia with the patient or authorized representative who has indicated his/her understanding and acceptance.       Plan Discussed with: CRNA and Anesthesiologist  Anesthesia Plan Comments:        Anesthesia Quick Evaluation

## 2023-09-07 NOTE — Discharge Instructions (Signed)

## 2023-09-08 LAB — SURGICAL PATHOLOGY

## 2023-09-09 ENCOUNTER — Encounter (HOSPITAL_COMMUNITY): Payer: Self-pay | Admitting: Gastroenterology

## 2023-09-24 ENCOUNTER — Encounter: Payer: Self-pay | Admitting: Internal Medicine

## 2023-09-24 ENCOUNTER — Ambulatory Visit (INDEPENDENT_AMBULATORY_CARE_PROVIDER_SITE_OTHER): Payer: 59 | Admitting: Podiatry

## 2023-09-24 DIAGNOSIS — M79674 Pain in right toe(s): Secondary | ICD-10-CM

## 2023-09-24 DIAGNOSIS — L609 Nail disorder, unspecified: Secondary | ICD-10-CM | POA: Diagnosis not present

## 2023-09-24 DIAGNOSIS — B351 Tinea unguium: Secondary | ICD-10-CM

## 2023-09-24 MED ORDER — CICLOPIROX 8 % EX SOLN: Freq: Every day | CUTANEOUS | 11 refills | Status: DC

## 2023-09-24 NOTE — Progress Notes (Signed)
 Chief Complaint  Patient presents with   Toe Pain    Rt great toe, nail is cut way back, thick and discolored. Pain even when the sheets touch it Pt had Chemo in 2017 and now nails are brittle and thick.  Take eliquis    HPI: 73 y.o. [name] presents today with concern of thick painful toenail to the right great toe.  He states that he has lost the nail in the past.  But he states it started looking quite abnormal when he went through chemotherapy in 2017.  He is not sure what treatment options are available on him has not tried anything at home at this point in time.  Past Medical History:  Diagnosis Date   Acute diastolic CHF (congestive heart failure) (HCC) 05/07/2023   Acute on chronic diastolic CHF (congestive heart failure) (HCC) 05/06/2023   Acute on chronic respiratory failure with hypoxia (HCC) 05/06/2023   Aortic atherosclerosis (HCC) 01/26/2023   Arthritis    Atrial fibrillation (HCC) 10/26/2011   BPH (benign prostatic hyperplasia) 2011   BRONCHOPNEUMONIA ORGANISM UNSPECIFIED 06/12/2010         Replacing diagnoses that were inactivated after the 12/21/22 regulatory import   Carpal tunnel syndrome, right upper limb 11/03/2022   Chronic respiratory failure with hypoxia (HCC) 08/27/2015   Congestive heart failure (HCC)    COPD (chronic obstructive pulmonary disease) (HCC)    COPD with acute exacerbation (HCC) 05/26/2023   Coronary artery calcification 01/26/2023   Cubital tunnel syndrome on right 07/09/2023   Depression    DOE (dyspnea on exertion) 04/07/2023   Essential hypertension 06/05/2008   Qualifier: Diagnosis of   By: [name]), [name]       GERD (gastroesophageal reflux disease)    Hypothyroidism 03/30/2012   Lesion of ulnar nerve, right upper limb 11/03/2022   Lymphadenopathy syndrome 09/12/2015   Mediastinal adenopathy 09/12/2015   NHL (non-Hodgkin's lymphoma) (HCC) 09/25/2015   Numbness of right hand 06/28/2022   Obesity    Open wound of right hand  without foreign body 07/27/2021   Other dysphagia 11/11/2010   Qualifier: Diagnosis of   By: [name], [name]       Polycythemia 01/26/2023   Port catheter in place 08/20/2016   Pulmonary hypertension (HCC)    Pure hypercholesterolemia 12/03/2011   Recurrent pneumonia 02/10/2012   Routine general medical examination at a health care facility 12/03/2011   Squamous cell carcinoma lung (HCC) 09/26/2010   S/p LUL resection Dr Brantley dec 2011      Status post bronchoscopy with biopsy    Stiffness of finger joint of right hand 09/24/2021   Testicular swelling 09/12/2015    Past Surgical History:  Procedure Laterality Date   APPENDECTOMY     BACK SURGERY     CARDIOVERSION  11/27/2011   Procedure: CARDIOVERSION;  Surgeon: Aleene JINNY Passe, MD;  Location: [facility] ENDOSCOPY;  Service: Cardiovascular;  Laterality: N/A;   COLONOSCOPY     COLONOSCOPY WITH PROPOFOL  N/A 09/07/2023   Procedure: COLONOSCOPY WITH PROPOFOL ;  Surgeon: Leigh Elspeth SQUIBB, MD;  Location: [facility] ENDOSCOPY;  Service: Gastroenterology;  Laterality: N/A;   FUDUCIAL PLACEMENT Right 10/13/2019   Procedure: Placement Of Fuducial;  Surgeon: Shelah Lamar RAMAN, MD;  Location: [facility] OR;  Service: Thoracic;  Laterality: Right;  Right Upper Lobe    LEFT HEART CATH AND CORONARY ANGIOGRAPHY N/A 04/12/2023   Procedure: LEFT HEART CATH AND CORONARY ANGIOGRAPHY;  Surgeon: Anner Alm ORN, MD;  Location: [facility] INVASIVE CV LAB;  Service:  Cardiovascular;  Laterality: N/A;   LUNG CANCER SURGERY  2011   POLYPECTOMY  09/07/2023   Procedure: POLYPECTOMY;  Surgeon: Leigh Elspeth SQUIBB, MD;  Location: [facility]OSCOPY;  Service: Gastroenterology;;   UPPER GI ENDOSCOPY     VIDEO BRONCHOSCOPY WITH ENDOBRONCHIAL NAVIGATION N/A 10/13/2019   Procedure: VIDEO BRONCHOSCOPY WITH ENDOBRONCHIAL NAVIGATION;  Surgeon: Shelah Lamar RAMAN, MD;  Location: [facility] OR;  Service: Thoracic;  Laterality: N/A;   VIDEO BRONCHOSCOPY WITH ENDOBRONCHIAL ULTRASOUND N/A 10/13/2019   Procedure: VIDEO  BRONCHOSCOPY WITH ENDOBRONCHIAL ULTRASOUND;  Surgeon: Shelah Lamar RAMAN, MD;  Location: [facility] OR;  Service: Thoracic;  Laterality: N/A;    Allergies  Allergen Reactions   Lipitor [Atorvastatin ] Other (See Comments)    Myalgias  Weakness  Fatigue    Omnipaque  [Iohexol ] Itching and Other (See Comments)    When asked about contrast media today, pt stated that last time he had the contrast that he was red all over and itchy from head to toe.     Physical Exam: Palpable pedal pulses right foot.  The right hallux nail is 4 to 5 mm thick with abnormal morphology.  There is some dried blood present along the medial nail border from where the patient tried to trim the nail back.  And no evidence of bacterial infection, or active drainage, is noted.  The nail is yellow, with distal onycholysis and pain with compression.  Assessment/Plan of Care: 1. Pain in right toe(s)   2. Dermatophytosis of nail   3. Nail abnormality      Meds ordered this encounter  Medications   ciclopirox  (PENLAC ) 8 % solution    Sig: Apply topically at bedtime. Apply a thin coat over nail. Apply daily over previous coat. Remove weekly with nail polish remover.    Dispense:  6.6 mL    Refill:  11   Discussed clinical findings with patient today.  The nail was debrided with sterile nail nippers and a power debriding bur today on the right hallux.  Discussed topical treatment with a 90-month course of ciclopirox  solution.  Will see if this has any improvement to the nail morphology and appearance.  If not we may need to permanently remove the toenail.  But we will give it one try prior to making this decision.  Informed patient that it started to have some type of toenail on this toe to protect the underlying and he was in agreement.   Awanda CHARM Imperial, DPM, FACFAS [facility]     [address] [place], [place] [address]                Office [phone]  Fax [phone]

## 2023-10-05 ENCOUNTER — Encounter: Payer: PPO | Admitting: Internal Medicine

## 2023-10-05 ENCOUNTER — Ambulatory Visit: Payer: PPO | Admitting: Internal Medicine

## 2023-10-05 ENCOUNTER — Ambulatory Visit (INDEPENDENT_AMBULATORY_CARE_PROVIDER_SITE_OTHER): Payer: PPO | Admitting: Internal Medicine

## 2023-10-05 ENCOUNTER — Encounter (HOSPITAL_BASED_OUTPATIENT_CLINIC_OR_DEPARTMENT_OTHER): Payer: PPO

## 2023-10-05 ENCOUNTER — Encounter: Payer: Self-pay | Admitting: Internal Medicine

## 2023-10-05 VITALS — BP 116/61 | HR 63 | Ht 72.0 in | Wt 205.0 lb

## 2023-10-05 DIAGNOSIS — J449 Chronic obstructive pulmonary disease, unspecified: Secondary | ICD-10-CM

## 2023-10-05 LAB — PULMONARY FUNCTION TEST
DL/VA % pred: 88 %
DL/VA: 3.5 ml/min/mmHg/L
DLCO cor % pred: 65 %
DLCO cor: 17.64 ml/min/mmHg
DLCO unc % pred: 56 %
DLCO unc: 15.41 ml/min/mmHg
FEF 25-75 Pre: 0.79 L/s
FEF2575-%Pred-Pre: 31 %
FEV1-%Pred-Pre: 51 %
FEV1-Pre: 1.74 L
FEV1FVC-%Pred-Pre: 78 %
FEV6-%Pred-Pre: 67 %
FEV6-Pre: 2.96 L
FEV6FVC-%Pred-Pre: 105 %
FVC-%Pred-Pre: 64 %
FVC-Pre: 3.02 L
Pre FEV1/FVC ratio: 57 %
Pre FEV6/FVC Ratio: 100 %

## 2023-10-05 LAB — COMPREHENSIVE METABOLIC PANEL
ALT: 12 U/L (ref 0–53)
AST: 16 U/L (ref 0–37)
Albumin: 4.4 g/dL (ref 3.5–5.2)
Alkaline Phosphatase: 97 U/L (ref 39–117)
BUN: 49 mg/dL — ABNORMAL HIGH (ref 6–23)
CO2: 27 meq/L (ref 19–32)
Calcium: 9.5 mg/dL (ref 8.4–10.5)
Chloride: 106 meq/L (ref 96–112)
Creatinine, Ser: 1.11 mg/dL (ref 0.40–1.50)
GFR: 66.09 mL/min (ref >60.00)
Glucose, Bld: 82 mg/dL (ref 70–99)
Potassium: 5.2 meq/L — ABNORMAL HIGH (ref 3.5–5.1)
Sodium: 140 meq/L (ref 135–145)
Total Bilirubin: 0.3 mg/dL (ref 0.2–1.2)
Total Protein: 6.9 g/dL (ref 6.0–8.3)

## 2023-10-05 LAB — PHOSPHORUS: Phosphorus: 4.4 mg/dL (ref 2.3–4.6)

## 2023-10-05 LAB — MAGNESIUM: Magnesium: 2 mg/dL (ref 1.5–2.5)

## 2023-10-05 NOTE — Patient Instructions (Addendum)
 ICD-10-CM   1. COPD, severe (HCC)  J44.9     2. Hypomagnesemia  E83.42         Noted history of flareup in September 2024 requiring oxygen  but currently stable and no flareup since then - Pulmonary function test is actually improved 2017 through January 2025 -Glad you are up-to-date with the flu shot RSV vaccine -No evidence of lung cancer recurrence on CT chest December 2024 -Noted prior history of hypomagnesemia but normal  in September 2024  Plan  - continue trelegy  -If you end up having repeated flareups there are other treatments like Dupixent or new nebulizer Othuvayre that we can consider  - continue night o2 -Check blood work for magnesium  [also check phosphorus and creatinine same visit]    Followup   - 6 months or sooner if needed 15-minute visit

## 2023-10-05 NOTE — Progress Notes (Signed)
 OV 10/05/2023  Subjective:  Patient ID: [name] [name], male , DOB: 04-21-1951 , age 73 y.o. , MRN: [id number] , ADDRESS: [address] [place] [id number] PCP Cleotilde Bernardino Hutchinson, PA Patient Care Team: Cleotilde Bernardino Hutchinson, [name] as PCP - General (Physician Assistant) Cornelius Wanda DEL, MD as Consulting Physician (Hematology and Oncology)  This Provider for this visit: Treatment Team:  Attending Provider: Geronimo Amel, MD    10/05/2023 -   Chief Complaint  Patient presents with   Follow-up    Pft f/u      HPI [name] 73 y.o. -returns for follow-up.  Last visit.  Saw me after having had a flareup and admission since then no hospitalizations no emergency room visits no prednisone  no urgent care visits.  He has seen Dr. Sherrod for his lung cancer surveillance.  He had a CT chest December 2020 for that I personally visualized he has right upper lobe old consolidation but other than that no evidence of cancer recurrence.  He states that he had low magnesium  and this was corrected in September 2024 and since then his health improved but he wants to get chemistry panel checked today.  This visit wanted to make sure his lung function was stable therefore we got a pulmonary function test but surprisingly his FEV1 and DLCO are much improved compared to 2017 he was pleasantly surprised.  He started joking that he was going to join the track team.  At this point we resolved that he would just continue Trelegy and we would keep an eye.    Simple office walk 224 (66+46 x 2) feet Pod A at [facility] x  3 laps goal with forehead probe 07/12/2023    O2 used ra   Number laps completed Just 1   Comments about pace normal   Resting Pulse Ox/HR 96% and x/min   Final Pulse Ox/HR 94% and x/min   Desaturated </= 88% no   Desaturated <= 3% points no   Got Tachycardic >/= 90/min nx   Symptoms at end of test no   Miscellaneous comments no      CT Chest data from date: December 2024  -  personally visualized and independently interpreted : Yes - my findings are: Right upper consolidation old and no evidence of cancer recurrence.  Latest Reference Range & Units 05/01/08 06:10 05/29/08 10:51 06/26/08 15:04 07/24/08 13:48 02/26/09 08:43 09/26/10 09:13 10/19/10 15:54 10/20/10 07:05 11/23/11 12:44 09/30/15 07:20 09/30/15 12:23 10/08/15 13:59 10/15/15 11:52 10/22/15 08:38 10/29/15 11:04 11/05/15 11:14 11/12/15 08:05 11/19/15 10:51 11/26/15 11:14 12/03/15 08:31 12/10/15 08:38 12/17/15 08:33 12/24/15 08:13 12/31/15 08:46 01/07/16 09:04 01/14/16 08:44 01/14/16 10:29 01/29/16 09:46 02/05/16 09:06 02/12/16 09:06 02/19/16 09:09 02/27/16 09:18 05/28/16 09:20 08/27/16 08:53 03/02/17 07:40 09/07/17 13:22 09/30/17 04:21 10/01/17 04:28 10/02/17 05:00 03/10/18 08:26 09/06/18 08:25 03/09/19 09:59 09/07/19 08:12 10/03/19 12:36 02/12/20 10:02 06/14/20 09:25 11/07/20 14:50 12/13/20 10:26 03/10/21 12:55 09/08/21 07:35 07/07/22 14:44 07/10/22 16:47 02/11/23 10:42 02/12/23 09:40 05/06/23 16:03 05/26/23 21:21 06/22/23 10:26 08/25/23 15:20  Eosinophils Absolute 0.0 - 0.5 K/uL 0.0 0.5 0.2 0.2 0.2 0.1 0.0 0.1 0.3 0.2 0.2 0.3 0.1 0.1 0.1 0.1 0.2 0.1 0.1 0.1 0.1 0.1 0.0 0.1 0.1 0.1 0.0 0.4 0.5 0.3 0.1 0.1 0.3 0.2 0.2 0.3 0.0 0.2 0.2 0.3 0.1 0.2 0.1 0.3 0.1 0.2 0.0 0.4 0.2 0.2 0.3 0.3 0.1 0.1 0.0 0.2 0.1 0.0     IMPRESSION: 1. Stable dense right apical consolidation surrounding the fiducials  and likely radiation change and chronic bronchial obstruction. 2. Stable borderline enlarged mediastinal nodes. 3. No findings metastatic disease. 4. Stable 10 mm right adrenal gland adenoma. 5. Stable complex lesion involving the spleen. 6. Stable simple bilateral renal cysts and upper pole right renal calculi.   Aortic Atherosclerosis (ICD10-I70.0) and Emphysema (ICD10-J43.9).     Electronically Signed   By: MYRTIS Stammer M.D.   On: 09/04/2023 10:17 PFT    Latest Ref Rng & Units 10/05/2023    9:39 AM 12/18/2015     9:44 AM  PFT Results  FVC-Pre L 3.02  P 2.75   FVC-Predicted Pre % 64  P 55   FVC-Post L  2.99   FVC-Predicted Post %  60   Pre FEV1/FVC % % 57  P 56   Post FEV1/FCV % %  56   FEV1-Pre L 1.74  P 1.53   FEV1-Predicted Pre % 51  P 41   FEV1-Post L  1.66   DLCO uncorrected ml/min/mmHg 15.41  P 11.85   DLCO UNC% % 56  P 33   DLCO corrected ml/min/mmHg 17.64  P 13.96   DLCO COR %Predicted % 65  P 39   DLVA Predicted % 88  P 65   TLC L  6.25   TLC % Predicted %  84   RV % Predicted %  143     P Preliminary result        LAB RESULTS last 96 hours No results found.  LAB RESULTS last 90 days Recent Results (from the past 2160 hours)  Basic Metabolic Panel (BMET)     Status: Abnormal   Collection Time: 07/16/23 10:01 AM  Result Value Ref Range   Glucose 160 (H) 70 - 99 mg/dL   BUN 28 (H) 8 - 27 mg/dL   Creatinine, Ser 8.91 0.76 - 1.27 mg/dL   eGFR 73 >40 fO/fpw/8.26   BUN/Creatinine Ratio 26 (H) 10 - 24   Sodium 142 134 - 144 mmol/L   Potassium 4.3 3.5 - 5.2 mmol/L   Chloride 101 96 - 106 mmol/L   CO2 31 (H) 20 - 29 mmol/L   Calcium  9.0 8.6 - 10.2 mg/dL  CMP (Cancer Center only)     Status: Abnormal   Collection Time: 08/25/23  3:20 PM  Result Value Ref Range   Sodium 139 135 - 145 mmol/L   Potassium 5.1 3.5 - 5.1 mmol/L   Chloride 107 98 - 111 mmol/L   CO2 29 22 - 32 mmol/L   Glucose, Bld 118 (H) 70 - 99 mg/dL    Comment: Glucose reference range applies only to samples taken after fasting for at least 8 hours.   BUN 39 (H) 8 - 23 mg/dL   Creatinine 8.94 9.38 - 1.24 mg/dL   Calcium  9.2 8.9 - 10.3 mg/dL   Total Protein 6.4 (L) 6.5 - 8.1 g/dL   Albumin 4.1 3.5 - 5.0 g/dL   AST 14 (L) 15 - 41 U/L   ALT 12 0 - 44 U/L   Alkaline Phosphatase 90 38 - 126 U/L   Total Bilirubin 0.3 <1.2 mg/dL   GFR, Estimated >39 >39 mL/min    Comment: (NOTE) Calculated using the CKD-EPI Creatinine Equation (2021)    Anion gap 3 (L) 5 - 15    Comment: Performed at [facility], [address] [place], [place] [address]  CBC with Differential (Cancer Center Only)     Status: Abnormal  Collection Time: 08/25/23  3:20 PM  Result Value Ref Range   WBC Count 6.2 4.0 - 10.5 K/uL   RBC 3.86 (L) 4.22 - 5.81 MIL/uL   Hemoglobin 10.8 (L) 13.0 - 17.0 g/dL   HCT 64.8 (L) 60.9 - 47.9 %   MCV 90.9 80.0 - 100.0 fL   MCH 28.0 26.0 - 34.0 pg   MCHC 30.8 30.0 - 36.0 g/dL   RDW 84.7 88.4 - 84.4 %   Platelet Count 171 150 - 400 K/uL   nRBC 0.0 0.0 - 0.2 %   Neutrophils Relative % 71 %   Neutro Abs 4.4 1.7 - 7.7 K/uL   Lymphocytes Relative 19 %   Lymphs Abs 1.2 0.7 - 4.0 K/uL   Monocytes Relative 9 %   Monocytes Absolute 0.5 0.1 - 1.0 K/uL   Eosinophils Relative 0 %   Eosinophils Absolute 0.0 0.0 - 0.5 K/uL   Basophils Relative 0 %   Basophils Absolute 0.0 0.0 - 0.1 K/uL   Immature Granulocytes 1 %   Abs Immature Granulocytes 0.04 0.00 - 0.07 K/uL    Comment: Performed at [facility], [address], [place], [place] [address]  Surgical pathology     Status: None   Collection Time: 09/07/23 10:04 AM  Result Value Ref Range   SURGICAL PATHOLOGY      SURGICAL PATHOLOGY CASE: [id number] PATIENT: [facility] Surgical Pathology Report     Clinical History: Abnormal finding on imaging of GI tract, chronic constipation, anticoagulated (tb)     FINAL MICROSCOPIC DIAGNOSIS:  A. COLON, CECUM, ASCENDING, POLYPECTOMY: -  Tubular adenoma. -  Separate fragments of chronic mucosa with mild hyperplastic change.  B. COLON, TRANSVERSE, RECTOSIGMOID, POLYPECTOMY: -  Hyperplastic polyps.    GROSS DESCRIPTION:  A: Received in formalin are tan, soft tissue fragments that are submitted in toto. Number: Multiple size: Range from 0.7 to 1.7 x 0.6 x 0.1 cm blocks: 1  B: Received in formalin are tan, soft tissue fragments that are submitted in toto. Number: Multiple size: Range from 0.3 to 0.6 cm blocks: 1 (KL  09/07/2023)    Final Diagnosis performed by Mark LeGolvan DO.   Electronically signed 09/08/2023 Technical and / or Professional components performed at [facility], [address] , [place], [place] [address].  Immunohistochemistry Technical component (if applicable) was performed at [facility]. [address], [place], [place] [address].   IMMUNOHISTOCHEMISTRY DISCLAIMER (if applicable): Some of these immunohistochemical stains may have been developed and the performance characteristics determine by [facility]. Some may not have been cleared or approved by the U.S. Food and Drug Administration. The FDA has determined that such clearance or approval is not necessary. This test is used for clinical purposes. It should not be regarded as investigational or for research. This laboratory is certified under the Clinical Laboratory Improvement Amendments of 1988 (CLIA-88) as qualified to perform high complexity clinical laboratory testing.  The controls stained appropriately.   IHC stains are performed on formalin fixed, paraffin embedded tissue using a 3,3diaminobenzidine (DAB) chromogen and Leica Bond Autostainer S ystem. The staining intensity of the nucleus is score manually and is reported as the percentage of tumor cell nuclei demonstrating specific nuclear staining. The specimens are fixed in 10% Neutral Formalin for at least 6 hours and up to 72hrs. These tests are validated on decalcified tissue. Results should be interpreted with caution given the possibility of false negative results on  decalcified specimens. Antibody Clones are as follows ER-clone 87F, PR-clone 16, Ki67- clone MM1. Some of these immunohistochemical stains may have been developed and the performance characteristics determined by [facility].   Pulmonary function test     Status: None (Preliminary result)   Collection Time: 10/05/23  9:39 AM   Result Value Ref Range   FVC-Pre 3.02 L   FVC-%Pred-Pre 64 %   FEV1-Pre 1.74 L   FEV1-%Pred-Pre 51 %   FEV6-Pre 2.96 L   FEV6-%Pred-Pre 67 %   Pre FEV1/FVC ratio 57 %   FEV1FVC-%Pred-Pre 78 %   Pre FEV6/FVC Ratio 100 %   FEV87FVC-%Pred-Pre 105 %   FEF 25-75 Pre 0.79 L/sec   FEF2575-%Pred-Pre 31 %   DLCO unc 15.41 ml/min/mmHg   DLCO unc % pred 56 %   DLCO cor 17.64 ml/min/mmHg   DLCO cor % pred 65 %   DL/VA 6.49 ml/min/mmHg/L   DL/VA % pred 88 %         has a past medical history of Acute diastolic CHF (congestive heart failure) (HCC) (05/07/2023), Acute on chronic diastolic CHF (congestive heart failure) (HCC) (05/06/2023), Acute on chronic respiratory failure with hypoxia (HCC) (05/06/2023), Aortic atherosclerosis (HCC) (01/26/2023), Arthritis, Atrial fibrillation (HCC) (10/26/2011), BPH (benign prostatic hyperplasia) (2011), BRONCHOPNEUMONIA ORGANISM UNSPECIFIED (06/12/2010), Carpal tunnel syndrome, right upper limb (11/03/2022), Chronic respiratory failure with hypoxia (HCC) (08/27/2015), Congestive heart failure (HCC), COPD (chronic obstructive pulmonary disease) (HCC), COPD with acute exacerbation (HCC) (05/26/2023), Coronary artery calcification (01/26/2023), Cubital tunnel syndrome on right (07/09/2023), Depression, DOE (dyspnea on exertion) (04/07/2023), Essential hypertension (06/05/2008), GERD (gastroesophageal reflux disease), Hypothyroidism (03/30/2012), Lesion of ulnar nerve, right upper limb (11/03/2022), Lymphadenopathy syndrome (09/12/2015), Mediastinal adenopathy (09/12/2015), NHL (non-Hodgkin's lymphoma) (HCC) (09/25/2015), Numbness of right hand (06/28/2022), Obesity, Open wound of right hand without foreign body (07/27/2021), Other dysphagia (11/11/2010), Polycythemia (01/26/2023), Port catheter in place (08/20/2016), Pulmonary hypertension (HCC), Pure hypercholesterolemia (12/03/2011), Recurrent pneumonia (02/10/2012), Routine general medical examination at a health  care facility (12/03/2011), Squamous cell carcinoma lung (HCC) (09/26/2010), Status post bronchoscopy with biopsy, Stiffness of finger joint of right hand (09/24/2021), and Testicular swelling (09/12/2015).   reports that he quit smoking about 21 years ago. His smoking use included cigarettes. He started smoking about 64 years ago. He has a 129 pack-year smoking history. He has never used smokeless tobacco.  Past Surgical History:  Procedure Laterality Date   APPENDECTOMY     BACK SURGERY     CARDIOVERSION  11/27/2011   Procedure: CARDIOVERSION;  Surgeon: Aleene JINNY Passe, MD;  Location: [facility] ENDOSCOPY;  Service: Cardiovascular;  Laterality: N/A;   COLONOSCOPY     COLONOSCOPY WITH PROPOFOL  N/A 09/07/2023   Procedure: COLONOSCOPY WITH PROPOFOL ;  Surgeon: Leigh Elspeth SQUIBB, MD;  Location: [facility] ENDOSCOPY;  Service: Gastroenterology;  Laterality: N/A;   FUDUCIAL PLACEMENT Right 10/13/2019   Procedure: Placement Of Fuducial;  Surgeon: Shelah Lamar RAMAN, MD;  Location: [facility] OR;  Service: Thoracic;  Laterality: Right;  Right Upper Lobe    LEFT HEART CATH AND CORONARY ANGIOGRAPHY N/A 04/12/2023   Procedure: LEFT HEART CATH AND CORONARY ANGIOGRAPHY;  Surgeon: Anner Alm ORN, MD;  Location: [facility] INVASIVE CV LAB;  Service: Cardiovascular;  Laterality: N/A;   LUNG CANCER SURGERY  2011   POLYPECTOMY  09/07/2023   Procedure: POLYPECTOMY;  Surgeon: Leigh Elspeth SQUIBB, MD;  Location: [facility];  Service: Gastroenterology;;   UPPER GI ENDOSCOPY     VIDEO BRONCHOSCOPY WITH ENDOBRONCHIAL NAVIGATION N/A 10/13/2019   Procedure: VIDEO BRONCHOSCOPY WITH  ENDOBRONCHIAL NAVIGATION;  Surgeon: Shelah Lamar RAMAN, MD;  Location: [facility] OR;  Service: Thoracic;  Laterality: N/A;   VIDEO BRONCHOSCOPY WITH ENDOBRONCHIAL ULTRASOUND N/A 10/13/2019   Procedure: VIDEO BRONCHOSCOPY WITH ENDOBRONCHIAL ULTRASOUND;  Surgeon: Shelah Lamar RAMAN, MD;  Location: [facility] OR;  Service: Thoracic;  Laterality: N/A;    Allergies  Allergen Reactions    Lipitor [Atorvastatin ] Other (See Comments)    Myalgias  Weakness  Fatigue    Omnipaque  [Iohexol ] Itching and Other (See Comments)    When asked about contrast media today, pt stated that last time he had the contrast that he was red all over and itchy from head to toe.     Immunization History  Administered Date(s) Administered   Fluad Trivalent(High Dose 65+) 05/28/2023   Influenza Whole 06/21/2009, 08/30/2011   Influenza, High Dose Seasonal PF 06/10/2017, 07/27/2018, 06/22/2019   Influenza-Unspecified 09/16/2015, 05/13/2016   Pneumococcal Conjugate-13 08/06/2017   Pneumococcal Polysaccharide-23 05/22/2008   Tdap 12/03/2011    Family History  Problem Relation Age of Onset   Heart disease Father    Cancer Neg Hx    Diabetes Neg Hx    Stroke Neg Hx    Hypertension Neg Hx    Kidney disease Neg Hx      Current Outpatient Medications:    allopurinol  (ZYLOPRIM ) 300 MG tablet, Take 300 mg by mouth daily., Disp: , Rfl:    amLODipine  (NORVASC ) 5 MG tablet, Take 5 mg by mouth daily., Disp: , Rfl:    apixaban  (ELIQUIS ) 5 MG TABS tablet, Take 1 tablet (5 mg total) by mouth 2 (two) times daily., Disp: 180 tablet, Rfl: 3   betamethasone  dipropionate 0.05 % lotion, Apply 1 application  topically 2 (two) times daily. To legs for psoriasis, Disp: , Rfl:    Buprenorphine HCl-Naloxone HCl 8-2 MG FILM, Place 0.5-1 Film under the tongue 2 (two) times daily as needed (back pain)., Disp: , Rfl:    ciclopirox  (PENLAC ) 8 % solution, Apply topically at bedtime. Apply a thin coat over nail. Apply daily over previous coat. Remove weekly with nail polish remover., Disp: 6.6 mL, Rfl: 11   ezetimibe  (ZETIA ) 10 MG tablet, Take 10 mg by mouth daily., Disp: , Rfl:    finasteride  (PROSCAR ) 5 MG tablet, Take 5 mg by mouth daily., Disp: , Rfl:    furosemide  (LASIX ) 20 MG tablet, Take 1 tablet (20 mg total) by mouth daily., Disp: 30 tablet, Rfl: 11   ipratropium-albuterol  (DUONEB) 0.5-2.5 (3) MG/3ML SOLN, Take 3  mLs by nebulization every 6 (six) hours as needed., Disp: 360 mL, Rfl: 1   levothyroxine  (SYNTHROID ) 175 MCG tablet, Take 175 mcg by mouth daily., Disp: , Rfl:    LINZESS  290 MCG CAPS capsule, Take 290 mcg by mouth daily., Disp: , Rfl:    lisinopril  (PRINIVIL ,ZESTRIL ) 10 MG tablet, Take 1 tablet (10 mg total) by mouth daily., Disp: 30 tablet, Rfl: 11   Magnesium  Chloride 64 MG TABS, Take 64 mg by mouth daily in the afternoon., Disp: 90 tablet, Rfl: 3   metoprolol  succinate (TOPROL  XL) 25 MG 24 hr tablet, Take 1 tablet (25 mg total) by mouth daily., Disp: 90 tablet, Rfl: 3   omeprazole  (PRILOSEC) 20 MG capsule, Take 1 capsule (20 mg total) by mouth 2 (two) times daily., Disp: , Rfl:    ondansetron  (ZOFRAN -ODT) 4 MG disintegrating tablet, Take 1 tablet (4 mg total) by mouth every 8 (eight) hours as needed., Disp: 20 tablet, Rfl: 0   PROAIR  HFA 108 (  90 Base) MCG/ACT inhaler, Inhale 2 puffs into the lungs every 6 (six) hours as needed for wheezing or shortness of breath., Disp: 3 Inhaler, Rfl: 1   rosuvastatin  (CRESTOR ) 10 MG tablet, Take 1 tablet (10 mg total) by mouth daily., Disp: 90 tablet, Rfl: 3   TRELEGY ELLIPTA 100-62.5-25 MCG/INH AEPB, Inhale 1 puff into the lungs daily., Disp: , Rfl:    zolpidem  (AMBIEN ) 10 MG tablet, Take 10 mg by mouth at bedtime as needed for sleep., Disp: , Rfl:       Objective:   Vitals:   10/05/23 1057  BP: 116/61  Pulse: 63  SpO2: 94%  Weight: 205 lb (93 kg)  Height: 6' (1.829 m)    Estimated body mass index is 27.8 kg/m as calculated from the following:   Height as of this encounter: 6' (1.829 m).   Weight as of this encounter: 205 lb (93 kg).  @WEIGHTCHANGE @  [employer]   10/05/23 1057  Weight: 205 lb (93 kg)     Physical Exam   General: No distress. Looks well O2 at rest: no Cane present: no Sitting in wheel chair: no Frail: no Obese: no Neuro: [name] and Oriented x 3. GCS 15. Speech normal Psych: Pleasant Resp:  Barrel Chest - no.   Wheeze - no, Crackles - no, No overt respiratory distress CVS: Normal heart sounds. Murmurs - no Ext: Stigmata of Connective Tissue Disease - no HEENT: Normal upper airway. PEERL +. No post nasal drip        Assessment:       ICD-10-CM   1. COPD, severe (HCC)  J44.9     2. Hypomagnesemia  E83.42          Plan:     Patient Instructions     ICD-10-CM   1. COPD, severe (HCC)  J44.9       Noted history of flareup in September 2024 requiring oxygen  but currently stable and no flareup since then - Pulmonary function test is actually improved 2017 through January 2025 -Glad you are up-to-date with the flu shot RSV vaccine -No evidence of lung cancer recurrence on CT chest December 2024 -Noted prior history of hypomagnesemia but normal  in September 2024  Plan  - continue trelegy  -If you end up having repeated flareups there are other treatments like Dupixent or new nebulizer Othuvayre that we can consider  - continue night o2 -Check blood work for magnesium  [also check phosphorus and creatinine same visit]    Followup   - 6 months or sooner if needed 15-minute visit   FOLLOWUP Return in about 6 months (around 04/03/2024) for 15 min visit, with Dr Geronimo, Face to Face OR Video Visit.    SIGNATURE    Dr. Dorethia Geronimo, M.D., F.C.C.P,  Pulmonary and Critical Care Medicine Staff Physician, [facility] Center Director - Interstitial Lung Disease  Program  Pulmonary Fibrosis [facility] at [facility] [place], [place], [address]  Pager: [phone], If no answer or between  15:00h - 7:00h: call [phone] Telephone: [phone]  11:26 AM 10/05/2023

## 2023-10-05 NOTE — Patient Instructions (Signed)
ICD-10-CM   1. COPD, severe (HCC)  J44.9       Noted history of flareup in September 2024 requiring oxygen but currently stable -Glad you are up-to-date with the flu shot -Last breathing test was in 2017   Plan  - continue trelegy  - continue night o2 -Do spirometry and DLCO in 3 months -Continue colonoscopy evaluation December 2024 -Keep appoint with Dr. Arbutus Ped November 2024  -Timing of next CT scan of the chest by Dr. Arbutus Ped but if not we can get 1 in May 2025    Followup 3 months or sooner if needed 15-minute visit

## 2023-10-05 NOTE — Progress Notes (Signed)
 This encounter was created in error - please disregard.

## 2023-10-05 NOTE — Patient Instructions (Signed)
 Spirometry and diffusion capacity performed today.

## 2023-10-05 NOTE — Progress Notes (Signed)
 Spirometry and diffusion capacity performed today.

## 2023-10-15 ENCOUNTER — Encounter (HOSPITAL_BASED_OUTPATIENT_CLINIC_OR_DEPARTMENT_OTHER): Payer: Self-pay

## 2023-10-15 ENCOUNTER — Encounter (HOSPITAL_BASED_OUTPATIENT_CLINIC_OR_DEPARTMENT_OTHER): Payer: PPO

## 2023-10-25 ENCOUNTER — Telehealth: Payer: Self-pay | Admitting: Internal Medicine

## 2023-10-26 ENCOUNTER — Inpatient Hospital Stay: Payer: PPO | Attending: Internal Medicine

## 2023-10-26 DIAGNOSIS — Z95828 Presence of other vascular implants and grafts: Secondary | ICD-10-CM

## 2023-10-26 DIAGNOSIS — Z452 Encounter for adjustment and management of vascular access device: Secondary | ICD-10-CM | POA: Diagnosis present

## 2023-10-26 DIAGNOSIS — C3411 Malignant neoplasm of upper lobe, right bronchus or lung: Secondary | ICD-10-CM | POA: Diagnosis not present

## 2023-10-26 MED ORDER — SODIUM CHLORIDE 0.9% FLUSH
10.0000 mL | INTRAVENOUS | Status: DC | PRN
  Administered 2023-10-26: 10 mL via INTRAVENOUS

## 2023-10-26 MED ORDER — HEPARIN SOD (PORK) LOCK FLUSH 100 UNIT/ML IV SOLN
500.0000 [IU] | Freq: Once | INTRAVENOUS | Status: AC | PRN
  Administered 2023-10-26: 500 [IU] via INTRAVENOUS

## 2023-11-09 ENCOUNTER — Telehealth: Payer: Self-pay | Admitting: Cardiology

## 2023-11-09 NOTE — Telephone Encounter (Signed)
 Recommendations reviewed with pt as per Dr. Kem Parkinson note.  Pt verbalized understanding and had no additional questions.

## 2023-11-09 NOTE — Telephone Encounter (Signed)
BP 141/73 HR 79 Temp 99  Pt reports a continuous dull HA for 3-4 days. Pt states he is concerned with the weight gain and fluid. Please advise.

## 2023-11-09 NOTE — Telephone Encounter (Signed)
Pt c/o swelling/edema: STAT if pt has developed SOB within 24 hours  If swelling, where is the swelling located?   Feels bloated around his mid-section  How much weight have you gained and in what time span?   3 lbs in 2 days  Have you gained 2 pounds in a day or 5 pounds in a week?   3 lbs in 2 days  Do you have a log of your daily weights (if so, list)?   2/14  199.6 2/15   200 2/16   202 2/17   202.2 2/18   203  Are you currently taking a fluid pill?   Yes  Are you currently SOB?   A little bit  Have you traveled recently in a car or plane for an extended period of time?  No  Patient stated he feels bloated and has been having a lot of headaches.  Patient stated he is concern about his weight gain.

## 2023-11-11 ENCOUNTER — Ambulatory Visit: Payer: 59 | Attending: Cardiology | Admitting: Cardiology

## 2023-11-11 VITALS — BP 110/60 | HR 76 | Ht 72.0 in | Wt 206.0 lb

## 2023-11-11 DIAGNOSIS — I10 Essential (primary) hypertension: Secondary | ICD-10-CM

## 2023-11-11 DIAGNOSIS — I700 Atherosclerosis of aorta: Secondary | ICD-10-CM | POA: Diagnosis not present

## 2023-11-11 DIAGNOSIS — I4891 Unspecified atrial fibrillation: Secondary | ICD-10-CM

## 2023-11-11 DIAGNOSIS — I2510 Atherosclerotic heart disease of native coronary artery without angina pectoris: Secondary | ICD-10-CM | POA: Diagnosis not present

## 2023-11-11 NOTE — Patient Instructions (Addendum)
Medication Instructions:  Your physician recommends that you continue on your current medications as directed. Please refer to the Current Medication list given to you today.  *If you need a refill on your cardiac medications before your next appointment, please call your pharmacy*   Lab Work: None ordered If you have labs (blood work) drawn today and your tests are completely normal, you will receive your results only by: MyChart Message (if you have MyChart) OR A paper copy in the mail If you have any lab test that is abnormal or we need to change your treatment, we will call you to review the results.   Testing/Procedures: None ordered   Follow-Up: At [facility], you and your health needs are our priority.  As part of our continuing mission to provide you with exceptional heart care, we have created designated Provider Care Teams.  These Care Teams include your primary Cardiologist (physician) and Advanced Practice Providers (APPs -  Physician Assistants and Nurse Practitioners) who all work together to provide you with the care you need, when you need it.  We recommend signing up for the patient portal called "MyChart".  Sign up information is provided on this After Visit Summary.  MyChart is used to connect with patients for Virtual Visits (Telemedicine).  Patients are able to view lab/test results, encounter notes, upcoming appointments, etc.  Non-urgent messages can be sent to your provider as well.   To learn more about what you can do with MyChart, go to [email or web address].    Your next appointment:   9 month(s)  The format for your next appointment:   In Person  Provider:   Belva Crome, MD    Other Instructions [facility] [address] [place],  [place]  [address] Main: [phone]  [facility] at [facility] [address] [place],  [place]  [address] Main:  [phone]  Important Information About Sugar

## 2023-11-11 NOTE — Progress Notes (Signed)
Cardiology Office Note:    Date:  11/11/2023   ID:  [name], DOB 12-11-1950, MRN [id number]  PCP:  [name], PA  Cardiologist:  Garwin Brothers, MD   Referring MD: [name], PA    ASSESSMENT:    1. Aortic atherosclerosis (HCC)   2. Atrial fibrillation, unspecified type (HCC)   3. Essential hypertension   4. Coronary artery calcification    PLAN:    In order of problems listed above:  Coronary artery calcification: Secondary prevention stressed to the patient.  Importance of compliance with diet medication stressed and vocalized understanding. Essential hypertension: Blood pressure is stable and diet was emphasized.  Lifestyle modification urged. Atrial fibrillation:I discussed with the patient atrial fibrillation, disease process. Management and therapy including rate and rhythm control, anticoagulation benefits and potential risks were discussed extensively with the patient. Patient had multiple questions which were answered to patient's satisfaction. Mixed dyslipidemia: On lipid-lowering medications followed by primary care.  He tells me that he is going to see primary care in the next couple of can get a copy of blood work for me.  Goal LDL was less than 70. Patient will be seen in follow-up appointment in 6 months or earlier if the patient has any concerns.    Medication Adjustments/Labs and Tests Ordered: Current medicines are reviewed at length with the patient today.  Concerns regarding medicines are outlined above.  No orders of the defined types were placed in this encounter.  No orders of the defined types were placed in this encounter.    No chief complaint on file.    History of Present Illness:    [name] is a 73 y.o. male.  Patient has past medical history of coronary artery disease, paroxysmal atrial fibrillation, essential hypertension, mixed dyslipidemia.  He denies any problems at this time and takes care of activities of  daily living.  No chest pain orthopnea or PND.  He tries to stay active.  At the time of my evaluation, the patient is alert awake oriented and in no distress.  Past Medical History:  Diagnosis Date   Abnormal finding on GI tract imaging 09/07/2023   Acute diastolic CHF (congestive heart failure) (HCC) 05/07/2023   Acute on chronic diastolic CHF (congestive heart failure) (HCC) 05/06/2023   Acute on chronic respiratory failure with hypoxia (HCC) 05/06/2023   Aortic atherosclerosis (HCC) 01/26/2023   Arthritis    Atrial fibrillation (HCC) 10/26/2011   Benign neoplasm of colon 09/07/2023   BPH (benign prostatic hyperplasia) 2011   BRONCHOPNEUMONIA ORGANISM UNSPECIFIED 06/12/2010         Replacing diagnoses that were inactivated after the 12/21/22 regulatory import   Carpal tunnel syndrome, right upper limb 11/03/2022   Chronic respiratory failure with hypoxia (HCC) 08/27/2015   Congestive heart failure (HCC)    COPD (chronic obstructive pulmonary disease) (HCC)    COPD with acute exacerbation (HCC) 05/26/2023   Coronary artery calcification 01/26/2023   Cubital tunnel syndrome on right 07/09/2023   Depression    DOE (dyspnea on exertion) 04/07/2023   Essential hypertension 06/05/2008   Qualifier: Diagnosis of   By: Truman Hayward Duncan Dull), [name]       GERD (gastroesophageal reflux disease)    Hypothyroidism 03/30/2012   Lesion of ulnar nerve, right upper limb 11/03/2022   Lymphadenopathy syndrome 09/12/2015   Mediastinal adenopathy 09/12/2015   NHL (non-Hodgkin's lymphoma) (HCC) 09/25/2015   Numbness of right hand 06/28/2022   Obesity    Open  wound of right hand without foreign body 07/27/2021   Other dysphagia 11/11/2010   Qualifier: Diagnosis of   By: Marchelle Gearing MD, [name]       Polycythemia 01/26/2023   Port catheter in place 08/20/2016   Pulmonary hypertension (HCC)    Pure hypercholesterolemia 12/03/2011   Recurrent pneumonia 02/10/2012   Routine general medical examination at a  health care facility 12/03/2011   Squamous cell carcinoma lung (HCC) 09/26/2010   S/p LUL resection Dr [name] dec 2011      Status post bronchoscopy with biopsy    Stiffness of finger joint of right hand 09/24/2021   Testicular swelling 09/12/2015    Past Surgical History:  Procedure Laterality Date   APPENDECTOMY     BACK SURGERY     CARDIOVERSION  11/27/2011   Procedure: CARDIOVERSION;  Surgeon: Vesta Mixer, MD;  Location: [facility] ENDOSCOPY;  Service: Cardiovascular;  Laterality: N/A;   COLONOSCOPY     COLONOSCOPY WITH PROPOFOL N/A 09/07/2023   Procedure: COLONOSCOPY WITH PROPOFOL;  Surgeon: Benancio Deeds, MD;  Location: [facility] ENDOSCOPY;  Service: Gastroenterology;  Laterality: N/A;   FUDUCIAL PLACEMENT Right 10/13/2019   Procedure: Placement Of Fuducial;  Surgeon: Leslye Peer, MD;  Location: [facility] OR;  Service: Thoracic;  Laterality: Right;  Right Upper Lobe    LEFT HEART CATH AND CORONARY ANGIOGRAPHY N/A 04/12/2023   Procedure: LEFT HEART CATH AND CORONARY ANGIOGRAPHY;  Surgeon: Marykay Lex, MD;  Location: [facility] INVASIVE CV LAB;  Service: Cardiovascular;  Laterality: N/A;   LUNG CANCER SURGERY  2011   POLYPECTOMY  09/07/2023   Procedure: POLYPECTOMY;  Surgeon: Benancio Deeds, MD;  Location: [facility] ENDOSCOPY;  Service: Gastroenterology;;   UPPER GI ENDOSCOPY     VIDEO BRONCHOSCOPY WITH ENDOBRONCHIAL NAVIGATION N/A 10/13/2019   Procedure: VIDEO BRONCHOSCOPY WITH ENDOBRONCHIAL NAVIGATION;  Surgeon: Leslye Peer, MD;  Location: [facility] OR;  Service: Thoracic;  Laterality: N/A;   VIDEO BRONCHOSCOPY WITH ENDOBRONCHIAL ULTRASOUND N/A 10/13/2019   Procedure: VIDEO BRONCHOSCOPY WITH ENDOBRONCHIAL ULTRASOUND;  Surgeon: Leslye Peer, MD;  Location: [facility] OR;  Service: Thoracic;  Laterality: N/A;    Current Medications: Current Meds  Medication Sig   allopurinol (ZYLOPRIM) 300 MG tablet Take 300 mg by mouth daily.   amLODipine (NORVASC) 5 MG tablet Take 5 mg by mouth daily.   apixaban  (ELIQUIS) 5 MG TABS tablet Take 1 tablet (5 mg total) by mouth 2 (two) times daily.   betamethasone dipropionate 0.05 % lotion Apply 1 application  topically 2 (two) times daily. To legs for psoriasis   Buprenorphine HCl-Naloxone HCl 8-2 MG FILM Place 0.5-1 Film under the tongue 2 (two) times daily as needed (back pain).   ciclopirox (PENLAC) 8 % solution Apply topically at bedtime. Apply a thin coat over nail. Apply daily over previous coat. Remove weekly with nail polish remover.   ezetimibe (ZETIA) 10 MG tablet Take 10 mg by mouth daily.   finasteride (PROSCAR) 5 MG tablet Take 5 mg by mouth daily.   furosemide (LASIX) 20 MG tablet Take 1 tablet (20 mg total) by mouth daily.   ipratropium-albuterol (DUONEB) 0.5-2.5 (3) MG/3ML SOLN Take 3 mLs by nebulization every 6 (six) hours as needed.   levothyroxine (SYNTHROID) 175 MCG tablet Take 175 mcg by mouth daily.   LINZESS 290 MCG CAPS capsule Take 290 mcg by mouth daily.   lisinopril (PRINIVIL,ZESTRIL) 10 MG tablet Take 1 tablet (10 mg total) by mouth daily.   Magnesium Chloride 64 MG TABS  Take 64 mg by mouth daily in the afternoon.   metoprolol succinate (TOPROL XL) 25 MG 24 hr tablet Take 1 tablet (25 mg total) by mouth daily.   omeprazole (PRILOSEC) 20 MG capsule Take 1 capsule (20 mg total) by mouth 2 (two) times daily.   ondansetron (ZOFRAN-ODT) 4 MG disintegrating tablet Take 1 tablet (4 mg total) by mouth every 8 (eight) hours as needed.   PROAIR HFA 108 (90 Base) MCG/ACT inhaler Inhale 2 puffs into the lungs every 6 (six) hours as needed for wheezing or shortness of breath.   TRELEGY ELLIPTA 100-62.5-25 MCG/INH AEPB Inhale 1 puff into the lungs daily.   zolpidem (AMBIEN) 10 MG tablet Take 10 mg by mouth at bedtime as needed for sleep.     Allergies:   Lipitor [atorvastatin] and Omnipaque [iohexol]   Social History   Socioeconomic History   Marital status: Married    Spouse name: Not on file   Number of children: 1   Years of  education: Not on file   Highest education level: Not on file  Occupational History   Occupation: truck driver   Occupation: truck driver  Tobacco Use   Smoking status: Former    Current packs/day: 0.00    Average packs/day: 3.0 packs/day for 43.0 years (129.0 ttl pk-yrs)    Types: Cigarettes    Start date: 10/22/1958    Quit date: 10/22/2001    Years since quitting: 22.0   Smokeless tobacco: Never  Vaping Use   Vaping status: Never Used  Substance and Sexual Activity   Alcohol use: No   Drug use: No   Sexual activity: Yes  Other Topics Concern   Not on file  Social History Narrative   Resides in [place] with his wife.  Has 1 daughter and 1 granddaughter.   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   Social Drivers of Corporate investment banker Strain: Not on file  Food Insecurity: No Food Insecurity (05/27/2023)   Hunger Vital Sign    Worried About Running Out of Food in the Last Year: Never true    Ran Out of Food in the Last Year: Never true  Transportation Needs: No Transportation Needs (05/27/2023)   PRAPARE - Administrator, Civil Service (Medical): No    Lack of Transportation (Non-Medical): No  Physical Activity: Not on file  Stress: Not on file  Social Connections: Not on file     Family History: The patient's family history includes Heart disease in his father. There is no history of Cancer, Diabetes, Stroke, Hypertension, or Kidney disease.  ROS:   Please see the history of present illness.    All other systems reviewed and are negative.  EKGs/Labs/Other Studies Reviewed:    The following studies were reviewed today: I discussed my findings with the patient at length   Recent Labs: 05/25/2023: NT-Pro BNP 2,365 05/26/2023: B Natriuretic Peptide 319.7 05/27/2023: TSH 0.237 08/25/2023: Hemoglobin 10.8; Platelet Count 171 10/05/2023: ALT 12; BUN 49; Creatinine, Ser 1.11; Magnesium 2.0; Potassium 5.2 No hemolysis seen; Sodium  140  Recent Lipid Panel    Component Value Date/Time   CHOL 169 04/07/2023 1009   TRIG 65 04/07/2023 1009   HDL 40 04/07/2023 1009   CHOLHDL 4.2 04/07/2023 1009   CHOLHDL 5 12/03/2011 1024   VLDL 17.0 12/03/2011 1024   LDLCALC 116 (H) 04/07/2023 1009   LDLDIRECT 99 05/18/2023 1548   LDLDIRECT 205.4 12/03/2011 1024  Physical Exam:    VS:  BP 110/60   Pulse 76   Ht 6' (1.829 m)   Wt 206 lb (93.4 kg)   SpO2 93%   BMI 27.94 kg/m     Wt Readings from Last 3 Encounters:  11/11/23 206 lb (93.4 kg)  10/05/23 205 lb (93 kg)  08/26/23 211 lb 3.2 oz (95.8 kg)     GEN: Patient is in no acute distress HEENT: Normal NECK: No JVD; No carotid bruits LYMPHATICS: No lymphadenopathy CARDIAC: Hear sounds regular, 2/6 systolic murmur at the apex. RESPIRATORY:  Clear to auscultation without rales, wheezing or rhonchi  ABDOMEN: Soft, non-tender, non-distended MUSCULOSKELETAL:  No edema; No deformity  SKIN: Warm and dry NEUROLOGIC:  Alert and oriented x 3 PSYCHIATRIC:  Normal affect   Signed, Garwin Brothers, MD  11/11/2023 12:57 PM     [facility]

## 2023-11-12 ENCOUNTER — Encounter: Payer: Self-pay | Admitting: Internal Medicine

## 2023-12-07 ENCOUNTER — Inpatient Hospital Stay: Attending: Internal Medicine

## 2023-12-07 DIAGNOSIS — C3411 Malignant neoplasm of upper lobe, right bronchus or lung: Secondary | ICD-10-CM | POA: Insufficient documentation

## 2023-12-07 DIAGNOSIS — Z95828 Presence of other vascular implants and grafts: Secondary | ICD-10-CM

## 2023-12-07 DIAGNOSIS — Z452 Encounter for adjustment and management of vascular access device: Secondary | ICD-10-CM | POA: Diagnosis present

## 2023-12-07 MED ORDER — HEPARIN SOD (PORK) LOCK FLUSH 100 UNIT/ML IV SOLN
500.0000 [IU] | Freq: Once | INTRAVENOUS | Status: AC | PRN
  Administered 2023-12-07: 500 [IU] via INTRAVENOUS

## 2023-12-07 MED ORDER — SODIUM CHLORIDE 0.9% FLUSH
10.0000 mL | INTRAVENOUS | Status: DC | PRN
  Administered 2023-12-07: 10 mL via INTRAVENOUS

## 2023-12-23 ENCOUNTER — Ambulatory Visit (INDEPENDENT_AMBULATORY_CARE_PROVIDER_SITE_OTHER): Payer: 59 | Admitting: Podiatry

## 2023-12-23 DIAGNOSIS — B351 Tinea unguium: Secondary | ICD-10-CM | POA: Diagnosis not present

## 2023-12-23 DIAGNOSIS — M79674 Pain in right toe(s): Secondary | ICD-10-CM | POA: Diagnosis not present

## 2023-12-23 MED ORDER — CICLOPIROX 8 % EX SOLN: Freq: Every day | CUTANEOUS | 11 refills | Status: AC

## 2023-12-23 NOTE — Progress Notes (Signed)
 Chief Complaint  Patient presents with   Nail Problem    3 month fungal nail check. Doing better. Even the swelling in the great toe has gone down some. Not diabetic and takes Elliquis.    HPI: 73 y.o. male presents today for follow-up of fungus in the right great toenail.  He has been using the ciclopirox lacquer on the nail.  He states that it is difficult to get all of this off once weekly.  Past Medical History:  Diagnosis Date   Abnormal finding on GI tract imaging 09/07/2023   Acute diastolic CHF (congestive heart failure) (HCC) 05/07/2023   Acute on chronic diastolic CHF (congestive heart failure) (HCC) 05/06/2023   Acute on chronic respiratory failure with hypoxia (HCC) 05/06/2023   Aortic atherosclerosis (HCC) 01/26/2023   Arthritis    Atrial fibrillation (HCC) 10/26/2011   Benign neoplasm of colon 09/07/2023   BPH (benign prostatic hyperplasia) 2011   BRONCHOPNEUMONIA ORGANISM UNSPECIFIED 06/12/2010         Replacing diagnoses that were inactivated after the 12/21/22 regulatory import   Carpal tunnel syndrome, right upper limb 11/03/2022   Chronic respiratory failure with hypoxia (HCC) 08/27/2015   Congestive heart failure (HCC)    COPD (chronic obstructive pulmonary disease) (HCC)    COPD with acute exacerbation (HCC) 05/26/2023   Coronary artery calcification 01/26/2023   Cubital tunnel syndrome on right 07/09/2023   Depression    DOE (dyspnea on exertion) 04/07/2023   Essential hypertension 06/05/2008   Qualifier: Diagnosis of   By: Truman Hayward Duncan Dull), [name]       GERD (gastroesophageal reflux disease)    Hypothyroidism 03/30/2012   Lesion of ulnar nerve, right upper limb 11/03/2022   Lymphadenopathy syndrome 09/12/2015   Mediastinal adenopathy 09/12/2015   NHL (non-Hodgkin's lymphoma) (HCC) 09/25/2015   Numbness of right hand 06/28/2022   Obesity    Open wound of right hand without foreign body 07/27/2021   Other dysphagia 11/11/2010   Qualifier: Diagnosis  of   By: Marchelle Gearing MD, [name]       Polycythemia 01/26/2023   Port catheter in place 08/20/2016   Pulmonary hypertension (HCC)    Pure hypercholesterolemia 12/03/2011   Recurrent pneumonia 02/10/2012   Routine general medical examination at a health care facility 12/03/2011   Squamous cell carcinoma lung (HCC) 09/26/2010   S/p LUL resection Dr [name] dec 2011      Status post bronchoscopy with biopsy    Stiffness of finger joint of right hand 09/24/2021   Testicular swelling 09/12/2015    Past Surgical History:  Procedure Laterality Date   APPENDECTOMY     BACK SURGERY     CARDIOVERSION  11/27/2011   Procedure: CARDIOVERSION;  Surgeon: Vesta Mixer, MD;  Location: [facility] ENDOSCOPY;  Service: Cardiovascular;  Laterality: N/A;   COLONOSCOPY     COLONOSCOPY WITH PROPOFOL N/A 09/07/2023   Procedure: COLONOSCOPY WITH PROPOFOL;  Surgeon: Benancio Deeds, MD;  Location: [facility] ENDOSCOPY;  Service: Gastroenterology;  Laterality: N/A;   FUDUCIAL PLACEMENT Right 10/13/2019   Procedure: Placement Of Fuducial;  Surgeon: Leslye Peer, MD;  [facility] OR;  Service: Thoracic;  Laterality: Right;  Right Upper Lobe    LEFT HEART CATH AND CORONARY ANGIOGRAPHY N/A 04/12/2023   Procedure: LEFT HEART CATH AND CORONARY ANGIOGRAPHY;  Surgeon: Marykay Lex, MD;  Location: [facility] INVASIVE CV LAB;  Service: Cardiovascular;  Laterality: N/A;   LUNG CANCER SURGERY  2011   POLYPECTOMY  09/07/2023  Procedure: POLYPECTOMY;  Surgeon: Benancio Deeds, MD;  Location: [facility] ENDOSCOPY;  Service: Gastroenterology;;   UPPER GI ENDOSCOPY     VIDEO BRONCHOSCOPY WITH ENDOBRONCHIAL NAVIGATION N/A 10/13/2019   Procedure: VIDEO BRONCHOSCOPY WITH ENDOBRONCHIAL NAVIGATION;  Surgeon: Leslye Peer, MD;  Location: [facility] OR;  Service: Thoracic;  Laterality: N/A;   VIDEO BRONCHOSCOPY WITH ENDOBRONCHIAL ULTRASOUND N/A 10/13/2019   Procedure: VIDEO BRONCHOSCOPY WITH ENDOBRONCHIAL ULTRASOUND;  Surgeon: Leslye Peer, MD;  Location:  [facility] OR;  Service: Thoracic;  Laterality: N/A;    Allergies  Allergen Reactions   Lipitor [Atorvastatin] Other (See Comments)    Myalgias  Weakness  Fatigue    Omnipaque [Iohexol] Itching and Other (See Comments)    When asked about contrast media today, pt stated that last time he had the contrast that he was red all over and itchy from head to toe.      Physical Exam: Palpable pedal pulses noted.  Right hallux nail has moderate lacquer buildup present.  Upon removal of the lacquer there does appear to be moderate improvement of the toenail fungus.  Assessment/Plan of Care: 1. Dermatophytosis of nail   2. Pain in right toe(s)      Meds ordered this encounter  Medications   ciclopirox (PENLAC) 8 % solution    Sig: Apply topically at bedtime. Apply a thin coat over nail. Apply daily over previous coat. Remove weekly with nail polish remover.    Dispense:  6.6 mL    Refill:  11   Discussed clinical findings with patient today.  The right hallux nail was debrided to remove the lacquer and decrease bulk and length of the nail.  Patient agreed that upon removal of the lacquer of the nail does appear to be improved with regard to fungus.  He requested a new prescription to be sent into his pharmacy.  Therefore a new prescription for ciclopirox 8% lacquer was sent to his pharmacy.  Follow-up in 3 to 4 months for fungal nail recheck   Zarie Kosiba Orland Mustard, DPM, FACFAS [facility]     [address] [place], [place] [address]                Office [phone]  Fax [phone]

## 2024-01-18 ENCOUNTER — Encounter: Payer: Self-pay | Admitting: Internal Medicine

## 2024-01-18 ENCOUNTER — Inpatient Hospital Stay: Attending: Internal Medicine

## 2024-01-18 VITALS — BP 108/48 | HR 61 | Temp 97.9°F | Resp 18

## 2024-01-18 DIAGNOSIS — C3411 Malignant neoplasm of upper lobe, right bronchus or lung: Secondary | ICD-10-CM | POA: Insufficient documentation

## 2024-01-18 DIAGNOSIS — Z452 Encounter for adjustment and management of vascular access device: Secondary | ICD-10-CM | POA: Insufficient documentation

## 2024-01-18 DIAGNOSIS — Z95828 Presence of other vascular implants and grafts: Secondary | ICD-10-CM

## 2024-01-18 MED ORDER — HEPARIN SOD (PORK) LOCK FLUSH 100 UNIT/ML IV SOLN
500.0000 [IU] | Freq: Once | INTRAVENOUS | Status: AC | PRN
Start: 2024-01-18 — End: 2024-01-18
  Administered 2024-01-18: 500 [IU] via INTRAVENOUS

## 2024-01-18 MED ORDER — SODIUM CHLORIDE 0.9% FLUSH
10.0000 mL | INTRAVENOUS | Status: DC | PRN
  Administered 2024-01-18: 10 mL via INTRAVENOUS

## 2024-01-21 ENCOUNTER — Inpatient Hospital Stay
Admission: RE | Admit: 2024-01-21 | Discharge: 2024-01-21 | Disposition: A | Discharge status: Home / Self Care | Payer: Self-pay | Source: Ambulatory Visit | Attending: Physician Assistant | Admitting: Physician Assistant

## 2024-01-21 ENCOUNTER — Other Ambulatory Visit (HOSPITAL_COMMUNITY): Payer: Self-pay | Admitting: Physician Assistant

## 2024-01-21 DIAGNOSIS — C3490 Malignant neoplasm of unspecified part of unspecified bronchus or lung: Secondary | ICD-10-CM

## 2024-01-24 ENCOUNTER — Encounter (HOSPITAL_BASED_OUTPATIENT_CLINIC_OR_DEPARTMENT_OTHER): Payer: Self-pay | Admitting: Student

## 2024-01-24 ENCOUNTER — Other Ambulatory Visit: Payer: Self-pay | Admitting: Cardiology

## 2024-01-24 ENCOUNTER — Ambulatory Visit (HOSPITAL_BASED_OUTPATIENT_CLINIC_OR_DEPARTMENT_OTHER): Admitting: Student

## 2024-01-24 ENCOUNTER — Ambulatory Visit (HOSPITAL_BASED_OUTPATIENT_CLINIC_OR_DEPARTMENT_OTHER)
Admission: RE | Admit: 2024-01-24 | Discharge: 2024-01-24 | Disposition: A | Discharge status: Home / Self Care | Source: Ambulatory Visit | Attending: Student | Admitting: Student

## 2024-01-24 VITALS — BP 121/71 | HR 63 | Temp 98.5°F | Resp 16 | Ht 70.87 in | Wt 209.7 lb

## 2024-01-24 DIAGNOSIS — Z7689 Persons encountering health services in other specified circumstances: Secondary | ICD-10-CM

## 2024-01-24 DIAGNOSIS — J439 Emphysema, unspecified: Secondary | ICD-10-CM

## 2024-01-24 DIAGNOSIS — M25532 Pain in left wrist: Secondary | ICD-10-CM

## 2024-01-24 DIAGNOSIS — I10 Essential (primary) hypertension: Secondary | ICD-10-CM | POA: Diagnosis not present

## 2024-01-24 DIAGNOSIS — M1A09X0 Idiopathic chronic gout, multiple sites, without tophus (tophi): Secondary | ICD-10-CM

## 2024-01-24 DIAGNOSIS — C8332 Diffuse large B-cell lymphoma, intrathoracic lymph nodes: Secondary | ICD-10-CM

## 2024-01-24 DIAGNOSIS — I482 Chronic atrial fibrillation, unspecified: Secondary | ICD-10-CM

## 2024-01-24 HISTORY — DX: Idiopathic chronic gout, multiple sites, without tophus (tophi): M1A.09X0

## 2024-01-24 MED ORDER — METHYLPREDNISOLONE 4 MG PO TBPK
ORAL_TABLET | ORAL | 0 refills | Status: DC
Start: 2024-01-24 — End: 2024-02-21

## 2024-01-24 NOTE — Assessment & Plan Note (Signed)
 Atrial fibrillation managed with Eliquis  for anticoagulation and metoprolol  for rate control. Pulse is irregular. Previous cardioversion attempted in 2012-2014 but not recently. Cardioversion not suggested this time.

## 2024-01-24 NOTE — Assessment & Plan Note (Signed)
 Worsening left wrist Radial sided pain over the last couple of months, located on the radial side and exacerbated by thumb movement. Differential diagnosis includes gout, arthritis, and tendinopathy, possibly de Quervain's tendinopathy based upon pattern of tenderness. No history of carpal tunnel syndrome in the left wrist. Pain described as a picking and breaking sensation, rated six out of ten. - Order x-ray of the left wrist - Prescribe prednisone  taper to reduce inflammation - Consider referral to orthopedics for further evaluation and possible steroid injection

## 2024-01-24 NOTE — Progress Notes (Signed)
 New Patient Office Visit  Subjective    Patient ID: [name], male    DOB: [birth year]  Age: 73 y.o. MRN: [id number]  CC:  Chief Complaint  Patient presents with   Establish Care    Pt. Here to establish care.    Wrist Pain    Pt. C/o of left wrist pain that's been going on for a year and half. Pt. Stats the pain as a six.     Discussed the use of AI scribe software for clinical note transcription with the patient, who gave verbal consent to proceed.  History of Present Illness   [name] presents to establish care and for evaluation of left sided radial wrist pain. Prior PCP was [name] at [facility]. Last physical was unknown. he notes that he requires refills of no meds at this time.  He has been experiencing worsening pain in his left wrist over the past two months. He recalls a fall approximately a year and a half ago, which initially improved after a few weeks but has recently become more painful. The pain is described as feeling like 'something picks and breaks on me' and is exacerbated by certain movements, such as pulling the thumb or squeezing. He rates the pain as a six out of ten, with some days being significantly worse. He has not sought prior treatment for this issue. No radiation of pain up the arm.  He has a history of gout, which affects his feet, knees, ankles, and wrists. He is currently on allopurinol  for gout management but has not been given any medication for acute flare-ups. No history of kidney issues.  He has a history of non-Hodgkin's lymphoma diagnosed around 2011, with treatment involving chemotherapy in 2015-2017. He continues to have a port-a-cath, which is flushed every two months, but is not currently used for any treatment.  He has a history of atrial fibrillation and is on Eliquis  and metoprolol  for management. He reports a previous cardioversion attempt in 2012-2014, but he reverted back to atrial fibrillation.  He has COPD and a  history of lung cancer (now with only observation) and follows up with a pulmonologist. He quit smoking in 2003 after previously smoking four packs a day. He uses two liters of oxygen  at night but sometimes forgets to turn it on.  He has hypertension, which is well-controlled with medication. No history of diabetes.   Reviewed cardiology note from 11/11/23 Reviewed Pulmonology note from 10/05/23    Screenings:  Colon Cancer: 09/07/23- a couple polyps- next this upcoming December 2025 Lung Cancer: history of lung cancer on observation Diabetes: indicated HLD: indicated  The 10-year ASCVD risk score (Arnett DK, et al., 2019) is: 23.5%   Outpatient Encounter Medications as of 01/24/2024  Medication Sig   allopurinol  (ZYLOPRIM ) 300 MG tablet Take 300 mg by mouth daily.   amLODipine  (NORVASC ) 5 MG tablet Take 5 mg by mouth daily.   apixaban  (ELIQUIS ) 5 MG TABS tablet Take 1 tablet by mouth twice daily   betamethasone  dipropionate 0.05 % lotion Apply 1 application  topically 2 (two) times daily. To legs for psoriasis   Buprenorphine HCl-Naloxone HCl 8-2 MG FILM Place 0.5-1 Film under the tongue 2 (two) times daily as needed (back pain).   ciclopirox  (PENLAC ) 8 % solution Apply topically at bedtime. Apply a thin coat over nail. Apply daily over previous coat. Remove weekly with nail polish remover.   ezetimibe  (ZETIA ) 10 MG tablet Take 10 mg by  mouth daily.   finasteride  (PROSCAR ) 5 MG tablet Take 5 mg by mouth daily.   furosemide  (LASIX ) 20 MG tablet Take 1 tablet (20 mg total) by mouth daily.   ipratropium-albuterol  (DUONEB) 0.5-2.5 (3) MG/3ML SOLN Take 3 mLs by nebulization every 6 (six) hours as needed.   levothyroxine  (SYNTHROID ) 175 MCG tablet Take 175 mcg by mouth daily.   LINZESS  290 MCG CAPS capsule Take 290 mcg by mouth daily.   lisinopril  (PRINIVIL ,ZESTRIL ) 10 MG tablet Take 1 tablet (10 mg total) by mouth daily.   Magnesium  Chloride 64 MG TABS Take 64 mg by mouth daily in the  afternoon.   methylPREDNISolone  (MEDROL  DOSEPAK) 4 MG TBPK tablet Use as directed.   metoprolol  succinate (TOPROL  XL) 25 MG 24 hr tablet Take 1 tablet (25 mg total) by mouth daily.   omeprazole  (PRILOSEC) 20 MG capsule Take 1 capsule (20 mg total) by mouth 2 (two) times daily.   ondansetron  (ZOFRAN -ODT) 4 MG disintegrating tablet Take 1 tablet (4 mg total) by mouth every 8 (eight) hours as needed.   PROAIR  HFA 108 (90 Base) MCG/ACT inhaler Inhale 2 puffs into the lungs every 6 (six) hours as needed for wheezing or shortness of breath.   TRELEGY ELLIPTA 100-62.5-25 MCG/INH AEPB Inhale 1 puff into the lungs daily.   zolpidem  (AMBIEN ) 10 MG tablet Take 10 mg by mouth at bedtime as needed for sleep.   rosuvastatin  (CRESTOR ) 10 MG tablet Take 1 tablet (10 mg total) by mouth daily.   No facility-administered encounter medications on file as of 01/24/2024.    Past Medical History:  Diagnosis Date   Abnormal finding on GI tract imaging 09/07/2023   Acute diastolic CHF (congestive heart failure) (HCC) 05/07/2023   Acute on chronic diastolic CHF (congestive heart failure) (HCC) 05/06/2023   Acute on chronic respiratory failure with hypoxia (HCC) 05/06/2023   Aortic atherosclerosis (HCC) 01/26/2023   Arthritis    Atrial fibrillation (HCC) 10/26/2011   Benign neoplasm of colon 09/07/2023   BPH (benign prostatic hyperplasia) 2011   BRONCHOPNEUMONIA ORGANISM UNSPECIFIED 06/12/2010         Replacing diagnoses that were inactivated after the 12/21/22 regulatory import   Carpal tunnel syndrome, right upper limb 11/03/2022   Chronic respiratory failure with hypoxia (HCC) 08/27/2015   Congestive heart failure (HCC)    COPD (chronic obstructive pulmonary disease) (HCC)    COPD with acute exacerbation (HCC) 05/26/2023   Coronary artery calcification 01/26/2023   Cubital tunnel syndrome on right 07/09/2023   Depression    DOE (dyspnea on exertion) 04/07/2023   Essential hypertension 06/05/2008   Qualifier:  Diagnosis of   By: Jayson Michael Nonie Beady), [name]       GERD (gastroesophageal reflux disease)    Hypothyroidism 03/30/2012   Lesion of ulnar nerve, right upper limb 11/03/2022   Lymphadenopathy syndrome 09/12/2015   Mediastinal adenopathy 09/12/2015   NHL (non-Hodgkin's lymphoma) (HCC) 09/25/2015   Numbness of right hand 06/28/2022   Obesity    Open wound of right hand without foreign body 07/27/2021   Other dysphagia 11/11/2010   Qualifier: Diagnosis of   By: Bertrum Brodie MD, [name]       Polycythemia 01/26/2023   Port catheter in place 08/20/2016   Pulmonary hypertension (HCC)    Pure hypercholesterolemia 12/03/2011   Recurrent pneumonia 02/10/2012   Routine general medical examination at a health care facility 12/03/2011   Squamous cell carcinoma lung (HCC) 09/26/2010   S/p LUL resection Dr Percy Bracken dec 2011  Status post bronchoscopy with biopsy    Stiffness of finger joint of right hand 09/24/2021   Testicular swelling 09/12/2015    Past Surgical History:  Procedure Laterality Date   APPENDECTOMY     BACK SURGERY     CARDIOVERSION  11/27/2011   Procedure: CARDIOVERSION;  Surgeon: Lake Pilgrim, MD;  Location: [facility] ENDOSCOPY;  Service: Cardiovascular;  Laterality: N/A;   COLONOSCOPY     COLONOSCOPY WITH PROPOFOL  N/A 09/07/2023   Procedure: COLONOSCOPY WITH PROPOFOL ;  Surgeon: Ace Holder, MD;  Location: [facility] ENDOSCOPY;  Service: Gastroenterology;  Laterality: N/A;   FUDUCIAL PLACEMENT Right 10/13/2019   Procedure: Placement Of Fuducial;  Surgeon: Denson Flake, MD;  Location: [facility] OR;  Service: Thoracic;  Laterality: Right;  Right Upper Lobe    LEFT HEART CATH AND CORONARY ANGIOGRAPHY N/A 04/12/2023   Procedure: LEFT HEART CATH AND CORONARY ANGIOGRAPHY;  Surgeon: Arleen Lacer, MD;  Location: [facility] INVASIVE CV LAB;  Service: Cardiovascular;  Laterality: N/A;   LUNG CANCER SURGERY  2011   POLYPECTOMY  09/07/2023   Procedure: POLYPECTOMY;  Surgeon: Ace Holder, MD;   Location: [facility] ENDOSCOPY;  Service: Gastroenterology;;   UPPER GI ENDOSCOPY     VIDEO BRONCHOSCOPY WITH ENDOBRONCHIAL NAVIGATION N/A 10/13/2019   Procedure: VIDEO BRONCHOSCOPY WITH ENDOBRONCHIAL NAVIGATION;  Surgeon: Denson Flake, MD;  Location: [facility] OR;  Service: Thoracic;  Laterality: N/A;   VIDEO BRONCHOSCOPY WITH ENDOBRONCHIAL ULTRASOUND N/A 10/13/2019   Procedure: VIDEO BRONCHOSCOPY WITH ENDOBRONCHIAL ULTRASOUND;  Surgeon: Denson Flake, MD;  Location: [facility] OR;  Service: Thoracic;  Laterality: N/A;    Family History  Problem Relation Age of Onset   Heart disease Father    Cancer Neg Hx    Diabetes Neg Hx    Stroke Neg Hx    Hypertension Neg Hx    Kidney disease Neg Hx     Social History   Socioeconomic History   Marital status: Married    Spouse name: Not on file   Number of children: 1   Years of education: Not on file   Highest education level: Not on file  Occupational History   Occupation: truck driver   Occupation: truck driver  Tobacco Use   Smoking status: Former    Current packs/day: 0.00    Average packs/day: 3.0 packs/day for 43.0 years (129.0 ttl pk-yrs)    Types: Cigarettes    Start date: 10/22/1958    Quit date: 10/22/2001    Years since quitting: 22.2   Smokeless tobacco: Never  Vaping Use   Vaping status: Never Used  Substance and Sexual Activity   Alcohol  use: No   Drug use: No   Sexual activity: Yes  Other Topics Concern   Not on file  Social History Narrative   Resides in [place] with his wife.  Has 1 daughter and 1 granddaughter.   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   Social Drivers of Corporate investment banker Strain: Not on file  Food Insecurity: No Food Insecurity (05/27/2023)   Hunger Vital Sign    Worried About Running Out of Food in the Last Year: Never true    Ran Out of Food in the Last Year: Never true  Transportation Needs: No Transportation Needs (05/27/2023)   PRAPARE - Scientist, research (physical sciences) (Medical): No    Lack of Transportation (Non-Medical): No  Physical Activity: Not on file  Stress: Not on file  Social Connections: Not on file  Intimate Partner Violence: Not At Risk (05/27/2023)   Humiliation, Afraid, Rape, and Kick questionnaire    Fear of Current or Ex-Partner: No    Emotionally Abused: No    Physically Abused: No    Sexually Abused: No    ROS  Per HPI      Objective    BP 121/71   Pulse 63   Temp 98.5 F (36.9 C) (Oral)   Resp 16   Ht 5' 10.87" (1.8 m)   Wt 209 lb 11.2 oz (95.1 kg)   SpO2 95%   BMI 29.36 kg/m   Physical Exam Constitutional:      General: He is not in acute distress.    Appearance: Normal appearance. He is not ill-appearing.  HENT:     Head: Normocephalic and atraumatic.     Right Ear: External ear normal.     Left Ear: External ear normal.     Nose: Nose normal.     Mouth/Throat:     Mouth: Mucous membranes are moist.     Pharynx: Oropharynx is clear.  Eyes:     General: No scleral icterus.    Extraocular Movements: Extraocular movements intact.     Conjunctiva/sclera: Conjunctivae normal.     Pupils: Pupils are equal, round, and reactive to light.  Neck:     Vascular: No carotid bruit.  Cardiovascular:     Rate and Rhythm: Normal rate and regular rhythm.     Pulses: Normal pulses.     Heart sounds: Normal heart sounds. No murmur heard.    No friction rub.  Pulmonary:     Effort: Pulmonary effort is normal. No respiratory distress.     Breath sounds: Normal breath sounds. No wheezing, rhonchi or rales.  Musculoskeletal:        General: Normal range of motion.     Cervical back: Neck supple.     Right lower leg: No edema.     Left lower leg: No edema.     Comments: R wrist- tender around radial styloid. Cannot tolerate finkelstein test.  Skin:    General: Skin is warm and dry.     Coloration: Skin is not jaundiced or pale.  Neurological:     General: No focal deficit present.     Mental Status:  He is alert.  Psychiatric:        Mood and Affect: Mood normal.        Behavior: Behavior normal.          Assessment & Plan:   Encounter to establish care  Left wrist pain Assessment & Plan: Worsening left wrist Radial sided pain over the last couple of months, located on the radial side and exacerbated by thumb movement. Differential diagnosis includes gout, arthritis, and tendinopathy, possibly de Quervain's tendinopathy based upon pattern of tenderness. No history of carpal tunnel syndrome in the left wrist. Pain described as a picking and breaking sensation, rated six out of ten. - Order x-ray of the left wrist - Prescribe prednisone  taper to reduce inflammation - Consider referral to orthopedics for further evaluation and possible steroid injection  Orders: -     DG Wrist Complete Left -     methylPREDNISolone ; Use as directed.  Dispense: 21 each; Refill: 0  Diffuse large B-cell lymphoma of intrathoracic lymph nodes (HCC) Assessment & Plan: Non-Hodgkin's lymphoma treated with chemotherapy around 2015-2017. Port-a-cath flushed every two months but not currently used for treatment. CT scan scheduled  for Feb 17, 2024.    Pulmonary emphysema, unspecified emphysema type (HCC) Assessment & Plan: COPD well-managed with improved lung function. He quit smoking in 2003 and uses 2 liters of oxygen  at night. Occasional forgetfulness in turning on the oxygen . Crackles present in the lungs but not worsening.   Chronic atrial fibrillation [facility]) Assessment & Plan: Atrial fibrillation managed with Eliquis  for anticoagulation and metoprolol  for rate control. Pulse is irregular. Previous cardioversion attempted in 2012-2014 but not recently. Cardioversion not suggested this time.   Essential hypertension Assessment & Plan: Stable.  Hypertension well-controlled with a blood pressure reading of 121/71 mmHg.  Continue current regimen   Idiopathic chronic gout of multiple sites without  tophus Assessment & Plan: Stable unless this is contributory to his wrist.  Gout managed with allopurinol , affecting feet, knees, ankles, and wrist. No acute flare-up management provided previously.      Return in about 4 weeks (around 02/21/2024) for radial left wrist pain.   Chijioke Lasser T Corryn Madewell, PA-C

## 2024-01-24 NOTE — Assessment & Plan Note (Signed)
 Non-Hodgkin's lymphoma treated with chemotherapy around 2015-2017. Port-a-cath flushed every two months but not currently used for treatment. CT scan scheduled for Feb 17, 2024.

## 2024-01-24 NOTE — Patient Instructions (Signed)
 It was nice to see you today!  As we discussed in clinic:  I will send in a steroid pack for your wrist and an xray.  If you have any problems before your next visit feel free to message me via MyChart (minor issues or questions) or call the office, otherwise you may reach out to schedule an office visit.  Thank you! Achille Xiang, PA-C

## 2024-01-24 NOTE — Assessment & Plan Note (Signed)
 Stable.  Hypertension well-controlled with a blood pressure reading of 121/71 mmHg.  Continue current regimen

## 2024-01-24 NOTE — Assessment & Plan Note (Signed)
 Stable unless this is contributory to his wrist.  Gout managed with allopurinol , affecting feet, knees, ankles, and wrist. No acute flare-up management provided previously.

## 2024-01-24 NOTE — Telephone Encounter (Signed)
 Prescription refill request for Eliquis  received. Indication:afib Last office visit:2/25 Scr:1.11  1/25 Age: 73 Weight:93.4  kg  Prescription refilled

## 2024-01-24 NOTE — Assessment & Plan Note (Signed)
 COPD well-managed with improved lung function. He quit smoking in 2003 and uses 2 liters of oxygen  at night. Occasional forgetfulness in turning on the oxygen . Crackles present in the lungs but not worsening.

## 2024-01-28 ENCOUNTER — Other Ambulatory Visit (HOSPITAL_BASED_OUTPATIENT_CLINIC_OR_DEPARTMENT_OTHER): Payer: Self-pay | Admitting: Student

## 2024-01-28 DIAGNOSIS — M19032 Primary osteoarthritis, left wrist: Secondary | ICD-10-CM

## 2024-01-28 DIAGNOSIS — M19031 Primary osteoarthritis, right wrist: Secondary | ICD-10-CM | POA: Insufficient documentation

## 2024-01-28 HISTORY — DX: Primary osteoarthritis, right wrist: M19.031

## 2024-01-28 NOTE — Addendum Note (Signed)
 Addended by: Zacchary Pompei on: 01/28/2024 02:48 PM   Modules accepted: Orders

## 2024-02-07 ENCOUNTER — Telehealth: Payer: Self-pay | Admitting: Internal Medicine

## 2024-02-07 NOTE — Telephone Encounter (Signed)
 Left a voicemail with rescheduled appointment details. The labs needed to be before the CT scan.

## 2024-02-08 ENCOUNTER — Encounter: Payer: Self-pay | Admitting: Physician Assistant

## 2024-02-08 ENCOUNTER — Ambulatory Visit (INDEPENDENT_AMBULATORY_CARE_PROVIDER_SITE_OTHER): Admitting: Orthopedic Surgery

## 2024-02-08 DIAGNOSIS — M19032 Primary osteoarthritis, left wrist: Secondary | ICD-10-CM | POA: Diagnosis not present

## 2024-02-08 MED ORDER — LIDOCAINE HCL 1 % IJ SOLN
1.0000 mL | INTRAMUSCULAR | Status: AC | PRN
  Administered 2024-02-08: 1 mL

## 2024-02-08 MED ORDER — BETAMETHASONE SOD PHOS & ACET 6 (3-3) MG/ML IJ SUSP
6.0000 mg | INTRAMUSCULAR | Status: AC | PRN
  Administered 2024-02-08: 6 mg via INTRA_ARTICULAR

## 2024-02-08 NOTE — Progress Notes (Signed)
 [name] - 73 y.o. male MRN [id number]  Date of birth: 11-Aug-1951  Office Visit Note: Visit Date: 02/08/2024 PCP: Cherylene Corrente, PA-C Referred by: Cherylene Corrente, PA-C  Subjective: No chief complaint on file.  HPI: [name] is a pleasant 73 y.o. male who presents today for ongoing left wrist pain that is chronic in nature, has worsened more recently.  He has a remote history of a prior left wrist or basilar thumb fracture he believes that was treated nonoperatively a few years prior.  This may have been a prior scaphoid injury based on his radiographic workup.  Today, he is localizing pain to the wrist region.  Had a recent bout of significant pain and swelling in that area that has improved over the past few weeks.  Has not undergone any formalized treatment recently.  He has a complex medical history of prior lung cancer status post lobectomy as well as prior non-Hodgkin's lymphoma which currently in remission after chemotherapy.  He takes Eliquis  at baseline for atrial fibrillation.  He was asked to be seen by myself today by Norma Beckers Persons, PA for specific hand surgical evaluation.  Pertinent ROS were reviewed with the patient and found to be negative unless otherwise specified above in HPI.   Visit Reason: Left wrist SLAC arthritis  Duration of symptoms: 3 years Hand dominance: right Occupation: Retired, Naval architect Diabetic: No Smoking: No Heart/Lung History: lung CA s/p lobectomy, [place] lymphoma  Blood Thinners: Eliquis  for AFib  Prior Testing/EMG: XR Injections (Date): none Treatments: none Prior Surgery: none     Assessment & Plan: Visit Diagnoses:  1. Arthritis of left wrist     Plan: Extensive discussion was had with the patient today regarding his left wrist arthritis.  He has significant radiocarpal arthritis particularly at the radial scaphoid interface.  Despite significant radiographic findings, he has maintained fairly good range of motion  at the wrist region, may be having some motion through the midcarpal joints as well.  Given his ongoing pain, we did discuss appropriate treatment modalities for ongoing radiocarpal arthritis including conservative versus surgical treatments.  From conservative standpoint, we discussed activity modification, bracing, cortisone injections, anti-inflammatory medication both topical and oral.  From a surgical standpoint, I mentioned to him that he would be an appropriate candidate for potential salvage procedure in the future either scaphoidectomy and 4 corner fusion versus potential proximal row carpectomy with possible allograft spacer.  His capitate on radiographs appears to be fairly well-preserved.  For the time being, he is interested in potential cortisone injection to the left wrist today for symptom relief which I am in agreement with.  Cortisone injection was provided today without incident.  Risk and benefits of cortisone injection were discussed in detail, patient elected to proceed.  He was also provided with a left wrist brace today to be utilized as needed for symptoms going forward.  I have asked him to return in approximate 3 months time for repeat check regarding the effectiveness of the injection and potential next treatment steps.  He expressed full understanding.  I spent 45 minutes in the care of this patient today including review of previous documentation, imaging obtained, face-to-face time discussing all options regarding treatment and documenting the encounter.   Follow-up: No follow-ups on file.   Meds & Orders: No orders of the defined types were placed in this encounter.   Orders Placed This Encounter  Procedures   Medium Joint Inj  Procedures: Medium Joint Inj: L radiocarpal on 02/08/2024 10:33 AM Indications: pain Details: dorsal approach Medications: 1 mL lidocaine  1 %; 6 mg betamethasone  acetate-betamethasone  sodium phosphate  6 (3-3) MG/ML Outcome: tolerated  well, no immediate complications Procedure, treatment alternatives, risks and benefits explained, specific risks discussed.          Clinical History: No specialty comments available.  He reports that he quit smoking about 22 years ago. His smoking use included cigarettes. He started smoking about 65 years ago. He has a 129 pack-year smoking history. He has never used smokeless tobacco. No results for input(s): "HGBA1C", "LABURIC" in the last 8760 hours.  Objective:   Vital Signs: There were no vitals taken for this visit.  Physical Exam  Gen: Well-appearing, in no acute distress; non-toxic CV: Regular Rate. Well-perfused. Warm.  Resp: Breathing unlabored on room air; no wheezing. Psych: Fluid speech in conversation; appropriate affect; normal thought process  Ortho Exam Left wrist: - Notable tenderness over the radiocarpal region particular at the scapholunate interval and over the radial scaphoid interface - Range of motion of the left wrist flexion/extension 35/15, compared to contralateral side 65/55 - Able to perform composite fist without significant restriction, pronation/supination is intact 70/70 without notable instability - Grip strength Jamar 2 right 60, left 40 with moderate pain  Imaging: No results found. Prior images of the left wrist were reviewed today which show significant radial scaphoid osteoarthritis as well as STT and thumb CMC arthritis  Past Medical/Family/Surgical/Social History: Medications & Allergies reviewed per EMR, new medications updated. Patient Active Problem List   Diagnosis Date Noted   Primary osteoarthritis of right distal radioulnar joint 01/28/2024   Idiopathic chronic gout of multiple sites without tophus 01/24/2024   Pain in left wrist 01/24/2024   Abnormal finding on GI tract imaging 09/07/2023   Benign neoplasm of colon 09/07/2023   Cubital tunnel syndrome on right 07/09/2023   COPD with acute exacerbation (HCC) 05/26/2023    Acute diastolic CHF (congestive heart failure) (HCC) 05/07/2023   Acute on chronic diastolic CHF (congestive heart failure) (HCC) 05/06/2023   Acute on chronic respiratory failure with hypoxia (HCC) 05/06/2023   DOE (dyspnea on exertion) 04/07/2023   Arthritis 01/26/2023   Congestive heart failure (HCC) 01/26/2023   GERD (gastroesophageal reflux disease) 01/26/2023   Obesity 01/26/2023   Polycythemia 01/26/2023   Pulmonary hypertension (HCC) 01/26/2023   Aortic atherosclerosis (HCC) 01/26/2023   Coronary artery calcification 01/26/2023   Carpal tunnel syndrome, right upper limb 11/03/2022   Lesion of ulnar nerve, right upper limb 11/03/2022   Numbness of right hand 06/28/2022   Stiffness of finger joint of right hand 09/24/2021   Open wound of right hand without foreign body 07/27/2021   Status post bronchoscopy with biopsy    Port catheter in place 08/20/2016   NHL (non-Hodgkin's lymphoma) (HCC) 09/25/2015   Lymphadenopathy syndrome 09/12/2015   Mediastinal adenopathy 09/12/2015   Testicular swelling 09/12/2015   Chronic respiratory failure with hypoxia (HCC) 08/27/2015   Hypothyroidism 03/30/2012   Recurrent pneumonia 02/10/2012   Routine general medical examination at a health care facility 12/03/2011   Depression 12/03/2011   Pure hypercholesterolemia 12/03/2011   Atrial fibrillation (HCC) 10/26/2011   Other dysphagia 11/11/2010   Squamous cell carcinoma lung (HCC) 09/26/2010   BRONCHOPNEUMONIA ORGANISM UNSPECIFIED 06/12/2010   BPH (benign prostatic hyperplasia) 2011   ERECTILE DYSFUNCTION 09/24/2008   Essential hypertension 06/05/2008   COPD (chronic obstructive pulmonary disease) (HCC) 06/05/2008   Past Medical History:  Diagnosis  Date   Abnormal finding on GI tract imaging 09/07/2023   Acute diastolic CHF (congestive heart failure) (HCC) 05/07/2023   Acute on chronic diastolic CHF (congestive heart failure) (HCC) 05/06/2023   Acute on chronic respiratory failure with  hypoxia (HCC) 05/06/2023   Aortic atherosclerosis (HCC) 01/26/2023   Arthritis    Atrial fibrillation (HCC) 10/26/2011   Benign neoplasm of colon 09/07/2023   BPH (benign prostatic hyperplasia) 2011   BRONCHOPNEUMONIA ORGANISM UNSPECIFIED 06/12/2010         Replacing diagnoses that were inactivated after the 12/21/22 regulatory import   Carpal tunnel syndrome, right upper limb 11/03/2022   Chronic respiratory failure with hypoxia (HCC) 08/27/2015   Congestive heart failure (HCC)    COPD (chronic obstructive pulmonary disease) (HCC)    COPD with acute exacerbation (HCC) 05/26/2023   Coronary artery calcification 01/26/2023   Cubital tunnel syndrome on right 07/09/2023   Depression    DOE (dyspnea on exertion) 04/07/2023   Essential hypertension 06/05/2008   Qualifier: Diagnosis of   By: Jayson Michael Nonie Beady), [name]       GERD (gastroesophageal reflux disease)    Hypothyroidism 03/30/2012   Lesion of ulnar nerve, right upper limb 11/03/2022   Lymphadenopathy syndrome 09/12/2015   Mediastinal adenopathy 09/12/2015   NHL (non-Hodgkin's lymphoma) (HCC) 09/25/2015   Numbness of right hand 06/28/2022   Obesity    Open wound of right hand without foreign body 07/27/2021   Other dysphagia 11/11/2010   Qualifier: Diagnosis of   By: Bertrum Brodie MD, [name]       Polycythemia 01/26/2023   Port catheter in place 08/20/2016   Pulmonary hypertension (HCC)    Pure hypercholesterolemia 12/03/2011   Recurrent pneumonia 02/10/2012   Routine general medical examination at a health care facility 12/03/2011   Squamous cell carcinoma lung (HCC) 09/26/2010   S/p LUL resection Dr Percy Bracken dec 2011      Status post bronchoscopy with biopsy    Stiffness of finger joint of right hand 09/24/2021   Testicular swelling 09/12/2015   Family History  Problem Relation Age of Onset   Heart disease Father    Cancer Neg Hx    Diabetes Neg Hx    Stroke Neg Hx    Hypertension Neg Hx    Kidney disease Neg Hx    Past  Surgical History:  Procedure Laterality Date   APPENDECTOMY     BACK SURGERY     CARDIOVERSION  11/27/2011   Procedure: CARDIOVERSION;  Surgeon: Lake Pilgrim, MD;  Location[facility] ENDOSCOPY;  Service: Cardiovascular;  Laterality: N/A;   COLONOSCOPY     COLONOSCOPY WITH PROPOFOL  N/A 09/07/2023   Procedure: COLONOSCOPY WITH PROPOFOL ;  Surgeon: Ace Holder, MD;  Location: [facility] ENDOSCOPY;  Service: Gastroenterology;  Laterality: N/A;   FUDUCIAL PLACEMENT Right 10/13/2019   Procedure: Placement Of Fuducial;  Surgeon: Denson Flake, MD;  Location: [facility] OR;  Service: Thoracic;  Laterality: Right;  Right Upper Lobe    LEFT HEART CATH AND CORONARY ANGIOGRAPHY N/A 04/12/2023   Procedure: LEFT HEART CATH AND CORONARY ANGIOGRAPHY;  Surgeon: Arleen Lacer, MD;  Location: [facility] INVASIVE CV LAB;  Service: Cardiovascular;  Laterality: N/A;   LUNG CANCER SURGERY  2011   POLYPECTOMY  09/07/2023   Procedure: POLYPECTOMY;  Surgeon: Ace Holder, MD;  Location: [facility] ENDOSCOPY;  Service: Gastroenterology;;   UPPER GI ENDOSCOPY     VIDEO BRONCHOSCOPY WITH ENDOBRONCHIAL NAVIGATION N/A 10/13/2019   Procedure: VIDEO BRONCHOSCOPY WITH ENDOBRONCHIAL NAVIGATION;  Surgeon: Baldwin Levee,  Delora Ferry, MD;  Location: [facility] OR;  Service: Thoracic;  Laterality: N/A;   VIDEO BRONCHOSCOPY WITH ENDOBRONCHIAL ULTRASOUND N/A 10/13/2019   Procedure: VIDEO BRONCHOSCOPY WITH ENDOBRONCHIAL ULTRASOUND;  Surgeon: Denson Flake, MD;  Location: [facility] OR;  Service: Thoracic;  Laterality: N/A;   Social History   Occupational History   Occupation: truck driver   Occupation: truck driver  Tobacco Use   Smoking status: Former    Current packs/day: 0.00    Average packs/day: 3.0 packs/day for 43.0 years (129.0 ttl pk-yrs)    Types: Cigarettes    Start date: 10/22/1958    Quit date: 10/22/2001    Years since quitting: 22.3   Smokeless tobacco: Never  Vaping Use   Vaping status: Never Used  Substance and Sexual Activity   Alcohol  use: No    Drug use: No   Sexual activity: Yes    Fronia Depass Alvia Jointer, M.D. [facility], Hand Surgery

## 2024-02-11 ENCOUNTER — Inpatient Hospital Stay: Payer: PRIVATE HEALTH INSURANCE | Attending: Internal Medicine

## 2024-02-11 DIAGNOSIS — Z452 Encounter for adjustment and management of vascular access device: Secondary | ICD-10-CM | POA: Diagnosis present

## 2024-02-11 DIAGNOSIS — C3490 Malignant neoplasm of unspecified part of unspecified bronchus or lung: Secondary | ICD-10-CM

## 2024-02-11 DIAGNOSIS — C3491 Malignant neoplasm of unspecified part of right bronchus or lung: Secondary | ICD-10-CM | POA: Insufficient documentation

## 2024-02-11 DIAGNOSIS — Z95828 Presence of other vascular implants and grafts: Secondary | ICD-10-CM

## 2024-02-11 LAB — CMP (CANCER CENTER ONLY)
ALT: 17 U/L (ref 0–44)
AST: 16 U/L (ref 15–41)
Albumin: 4.1 g/dL (ref 3.5–5.0)
Alkaline Phosphatase: 107 U/L (ref 38–126)
Anion gap: 6 (ref 5–15)
BUN: 46 mg/dL — ABNORMAL HIGH (ref 8–23)
CO2: 26 mmol/L (ref 22–32)
Calcium: 8.9 mg/dL (ref 8.9–10.3)
Chloride: 109 mmol/L (ref 98–111)
Creatinine: 1.24 mg/dL (ref 0.61–1.24)
GFR, Estimated: >60 mL/min (ref >60)
Glucose, Bld: 131 mg/dL — ABNORMAL HIGH (ref 70–99)
Potassium: 5 mmol/L (ref 3.5–5.1)
Sodium: 141 mmol/L (ref 135–145)
Total Bilirubin: 0.4 mg/dL (ref 0.0–1.2)
Total Protein: 6.6 g/dL (ref 6.5–8.1)

## 2024-02-11 LAB — CBC WITH DIFFERENTIAL (CANCER CENTER ONLY)
Abs Immature Granulocytes: 0.13 10*3/uL — ABNORMAL HIGH (ref 0.00–0.07)
Basophils Absolute: 0 10*3/uL (ref 0.0–0.1)
Basophils Relative: 1 %
Eosinophils Absolute: 0.1 10*3/uL (ref 0.0–0.5)
Eosinophils Relative: 2 %
HCT: 32.6 % — ABNORMAL LOW (ref 39.0–52.0)
Hemoglobin: 10.3 g/dL — ABNORMAL LOW (ref 13.0–17.0)
Immature Granulocytes: 2 %
Lymphocytes Relative: 19 %
Lymphs Abs: 1.3 10*3/uL (ref 0.7–4.0)
MCH: 28.2 pg (ref 26.0–34.0)
MCHC: 31.6 g/dL (ref 30.0–36.0)
MCV: 89.3 fL (ref 80.0–100.0)
Monocytes Absolute: 0.6 10*3/uL (ref 0.1–1.0)
Monocytes Relative: 9 %
Neutro Abs: 4.4 10*3/uL (ref 1.7–7.7)
Neutrophils Relative %: 67 %
Platelet Count: 190 10*3/uL (ref 150–400)
RBC: 3.65 MIL/uL — ABNORMAL LOW (ref 4.22–5.81)
RDW: 14.3 % (ref 11.5–15.5)
WBC Count: 6.6 10*3/uL (ref 4.0–10.5)
nRBC: 0 % (ref 0.0–0.2)

## 2024-02-11 MED ORDER — HEPARIN SOD (PORK) LOCK FLUSH 100 UNIT/ML IV SOLN
500.0000 [IU] | Freq: Once | INTRAVENOUS | Status: AC | PRN
  Administered 2024-02-11: 500 [IU] via INTRAVENOUS

## 2024-02-11 MED ORDER — SODIUM CHLORIDE 0.9% FLUSH
10.0000 mL | INTRAVENOUS | Status: DC | PRN
  Administered 2024-02-11: 10 mL via INTRAVENOUS

## 2024-02-15 ENCOUNTER — Ambulatory Visit (HOSPITAL_COMMUNITY)
Admission: RE | Admit: 2024-02-15 | Discharge: 2024-02-15 | Disposition: A | Discharge status: Home / Self Care | Source: Ambulatory Visit | Attending: Internal Medicine | Admitting: Internal Medicine

## 2024-02-15 ENCOUNTER — Inpatient Hospital Stay: Payer: PRIVATE HEALTH INSURANCE

## 2024-02-15 DIAGNOSIS — C3490 Malignant neoplasm of unspecified part of unspecified bronchus or lung: Secondary | ICD-10-CM | POA: Diagnosis present

## 2024-02-17 ENCOUNTER — Telehealth: Payer: Self-pay | Admitting: Cardiology

## 2024-02-17 MED ORDER — FUROSEMIDE 20 MG PO TABS: ORAL_TABLET | ORAL | 2 refills | Status: DC

## 2024-02-17 NOTE — Telephone Encounter (Signed)
 Spoke with pt who states that he was advised to take an extra dose of Lasix  for weight gain of 3 pounds per day. RX adjusted and refill sent.

## 2024-02-17 NOTE — Telephone Encounter (Signed)
 Pt c/o medication issue:  1. Name of Medication: furosemide  (LASIX ) 20 MG tablet   2. How are you currently taking this medication (dosage and times per day)?    3. Are you having a reaction (difficulty breathing--STAT)? no  4. What is your medication issue? Pharmacy is calling for a new prescription for the patient for insurance. Patient states that he can take more then one pill as needed, the pills dont last like it should. Pleaee advise

## 2024-02-20 NOTE — Progress Notes (Unsigned)
 [facility] OFFICE PROGRESS NOTE  [name], Alexia Angelucci [address] [place] [address]  DIAGNOSIS:  1) stage IA (T1c, N0, M0) non-small cell lung cancer, squamous cell carcinoma diagnosed in January 2021 2) Stage III large B-cell non-Hodgkin lymphoma diagnosed in December 2016 and presented with extensive lymphadenopathy involving the neck, chest, abdomen as well as splenomegaly.   PRIOR THERAPY: 1) Systemic chemotherapy with the CHOP/Rituxan  every 3 weeks with Neulasta  support. Status post 6 cycles. 2) SBRT to the right upper lobe squamous cell carcinoma under the care of Dr. Eloise Hake in February 2021  CURRENT THERAPY: Observation   INTERVAL HISTORY: [name] 73 y.o. male returns  to the clinic today for a follow up visit. The patient was last seen by Dr. Marguerita Shih on 08/25/23. He is followed for his history of stage IA NSCLC in the RUL in 2021 s/p SBRT as well as history of NHL in 2016. He has been on observation and feeling fairly well. At his last appointment, he was endorsing significant weight loss. The weight loss helped his breathing.   He follows closely with cardiology for atrial fibrillation and CHF.   He also saw GI for abnormal uptake in the colon and had colonoscopy which showed ***  He also sees Dr. Bertrum Brodie from pulmonary medicine for COPD.    Since last being seen, he is feeling fair. He reports ***fatigue.  Denies any fever, chills, night sweats, or weight loss***. Denies any chest pain or hemoptysis.  Shortness of breath.  He denies any significant cough out of the ordinary.  Denies any nausea, vomiting, diarrhea, or constipation.  Denies any blood in the stool.  Denies any headache or visual changes. He recently had a restaging CT scan performed. The patient is here today for evaluation and to review his scan results.         MEDICAL HISTORY: Past Medical History:  Diagnosis Date   Abnormal finding on GI tract imaging 09/07/2023   Acute  diastolic CHF (congestive heart failure) (HCC) 05/07/2023   Acute on chronic diastolic CHF (congestive heart failure) (HCC) 05/06/2023   Acute on chronic respiratory failure with hypoxia (HCC) 05/06/2023   Aortic atherosclerosis (HCC) 01/26/2023   Arthritis    Atrial fibrillation (HCC) 10/26/2011   Benign neoplasm of colon 09/07/2023   BPH (benign prostatic hyperplasia) 2011   BRONCHOPNEUMONIA ORGANISM UNSPECIFIED 06/12/2010         Replacing diagnoses that were inactivated after the 12/21/22 regulatory import   Carpal tunnel syndrome, right upper limb 11/03/2022   Chronic respiratory failure with hypoxia (HCC) 08/27/2015   Congestive heart failure (HCC)    COPD (chronic obstructive pulmonary disease) (HCC)    COPD with acute exacerbation (HCC) 05/26/2023   Coronary artery calcification 01/26/2023   Cubital tunnel syndrome on right 07/09/2023   Depression    DOE (dyspnea on exertion) 04/07/2023   Essential hypertension 06/05/2008   Qualifier: Diagnosis of   By: Jayson Michael Nonie Beady), [name]       GERD (gastroesophageal reflux disease)    Hypothyroidism 03/30/2012   Lesion of ulnar nerve, right upper limb 11/03/2022   Lymphadenopathy syndrome 09/12/2015   Mediastinal adenopathy 09/12/2015   NHL (non-Hodgkin's lymphoma) (HCC) 09/25/2015   Numbness of right hand 06/28/2022   Obesity    Open wound of right hand without foreign body 07/27/2021   Other dysphagia 11/11/2010   Qualifier: Diagnosis of   By: Bertrum Brodie MD, [name]       Polycythemia 01/26/2023  Port catheter in place 08/20/2016   Pulmonary hypertension (HCC)    Pure hypercholesterolemia 12/03/2011   Recurrent pneumonia 02/10/2012   Routine general medical examination at a health care facility 12/03/2011   Squamous cell carcinoma lung (HCC) 09/26/2010   S/p LUL resection Dr Percy Bracken dec 2011      Status post bronchoscopy with biopsy    Stiffness of finger joint of right hand 09/24/2021   Testicular swelling 09/12/2015     ALLERGIES:  is allergic to lipitor [atorvastatin ] and omnipaque  [iohexol ].  MEDICATIONS:  Current Outpatient Medications  Medication Sig Dispense Refill   allopurinol  (ZYLOPRIM ) 300 MG tablet Take 300 mg by mouth daily.     amLODipine  (NORVASC ) 5 MG tablet Take 5 mg by mouth daily.     apixaban  (ELIQUIS ) 5 MG TABS tablet Take 1 tablet by mouth twice daily 180 tablet 1   betamethasone  dipropionate 0.05 % lotion Apply 1 application  topically 2 (two) times daily. To legs for psoriasis     Buprenorphine HCl-Naloxone HCl 8-2 MG FILM Place 0.5-1 Film under the tongue 2 (two) times daily as needed (back pain).     ciclopirox  (PENLAC ) 8 % solution Apply topically at bedtime. Apply a thin coat over nail. Apply daily over previous coat. Remove weekly with nail polish remover. 6.6 mL 11   ezetimibe  (ZETIA ) 10 MG tablet Take 10 mg by mouth daily.     finasteride  (PROSCAR ) 5 MG tablet Take 5 mg by mouth daily.     furosemide  (LASIX ) 20 MG tablet Take 20 mg (1 tablet) daily and take extra dose for weight gain of 3 pounds in a day. 100 tablet 2   ipratropium-albuterol  (DUONEB) 0.5-2.5 (3) MG/3ML SOLN Take 3 mLs by nebulization every 6 (six) hours as needed. 360 mL 1   levothyroxine  (SYNTHROID ) 175 MCG tablet Take 175 mcg by mouth daily.     LINZESS  290 MCG CAPS capsule Take 290 mcg by mouth daily.     lisinopril  (PRINIVIL ,ZESTRIL ) 10 MG tablet Take 1 tablet (10 mg total) by mouth daily. 30 tablet 11   Magnesium  Chloride 64 MG TABS Take 64 mg by mouth daily in the afternoon. 90 tablet 3   methylPREDNISolone  (MEDROL  DOSEPAK) 4 MG TBPK tablet Use as directed. 21 each 0   metoprolol  succinate (TOPROL  XL) 25 MG 24 hr tablet Take 1 tablet (25 mg total) by mouth daily. 90 tablet 3   omeprazole  (PRILOSEC) 20 MG capsule Take 1 capsule (20 mg total) by mouth 2 (two) times daily.     ondansetron  (ZOFRAN -ODT) 4 MG disintegrating tablet Take 1 tablet (4 mg total) by mouth every 8 (eight) hours as needed. 20 tablet  0   PROAIR  HFA 108 (90 Base) MCG/ACT inhaler Inhale 2 puffs into the lungs every 6 (six) hours as needed for wheezing or shortness of breath. 3 Inhaler 1   rosuvastatin  (CRESTOR ) 10 MG tablet Take 1 tablet (10 mg total) by mouth daily. 90 tablet 3   TRELEGY ELLIPTA 100-62.5-25 MCG/INH AEPB Inhale 1 puff into the lungs daily.     zolpidem  (AMBIEN ) 10 MG tablet Take 10 mg by mouth at bedtime as needed for sleep.     No current facility-administered medications for this visit.    SURGICAL HISTORY:  Past Surgical History:  Procedure Laterality Date   APPENDECTOMY     BACK SURGERY     CARDIOVERSION  11/27/2011   Procedure: CARDIOVERSION;  Surgeon: Lake Pilgrim, MD;  Location: [facility] ENDOSCOPY;  Service: Cardiovascular;  Laterality: N/A;   COLONOSCOPY     COLONOSCOPY WITH PROPOFOL  N/A 09/07/2023   Procedure: COLONOSCOPY WITH PROPOFOL ;  Surgeon: Ace Holder, MD;  Location: [facility] ENDOSCOPY;  Service: Gastroenterology;  Laterality: N/A;   FUDUCIAL PLACEMENT Right 10/13/2019   Procedure: Placement Of Fuducial;  Surgeon: Denson Flake, MD;  Location: [facility] OR;  Service: Thoracic;  Laterality: Right;  Right Upper Lobe    LEFT HEART CATH AND CORONARY ANGIOGRAPHY N/A 04/12/2023   Procedure: LEFT HEART CATH AND CORONARY ANGIOGRAPHY;  Surgeon: Arleen Lacer, MD;  Location: [facility] INVASIVE CV LAB;  Service: Cardiovascular;  Laterality: N/A;   LUNG CANCER SURGERY  2011   POLYPECTOMY  09/07/2023   Procedure: POLYPECTOMY;  Surgeon: Ace Holder, MD;  Location: [facility] ENDOSCOPY;  Service: Gastroenterology;;   UPPER GI ENDOSCOPY     VIDEO BRONCHOSCOPY WITH ENDOBRONCHIAL NAVIGATION N/A 10/13/2019   Procedure: VIDEO BRONCHOSCOPY WITH ENDOBRONCHIAL NAVIGATION;  Surgeon: Denson Flake, MD;  Location: [facility] OR;  Service: Thoracic;  Laterality: N/A;   VIDEO BRONCHOSCOPY WITH ENDOBRONCHIAL ULTRASOUND N/A 10/13/2019   Procedure: VIDEO BRONCHOSCOPY WITH ENDOBRONCHIAL ULTRASOUND;  Surgeon: Denson Flake, MD;   Location: [facility] OR;  Service: Thoracic;  Laterality: N/A;    REVIEW OF SYSTEMS:   Review of Systems  Constitutional: Negative for appetite change, chills, fatigue, fever and unexpected weight change.  HENT:   Negative for mouth sores, nosebleeds, sore throat and trouble swallowing.   Eyes: Negative for eye problems and icterus.  Respiratory: Negative for cough, hemoptysis, shortness of breath and wheezing.   Cardiovascular: Negative for chest pain and leg swelling.  Gastrointestinal: Negative for abdominal pain, constipation, diarrhea, nausea and vomiting.  Genitourinary: Negative for bladder incontinence, difficulty urinating, dysuria, frequency and hematuria.   Musculoskeletal: Negative for back pain, gait problem, neck pain and neck stiffness.  Skin: Negative for itching and rash.  Neurological: Negative for dizziness, extremity weakness, gait problem, headaches, light-headedness and seizures.  Hematological: Negative for adenopathy. Does not bruise/bleed easily.  Psychiatric/Behavioral: Negative for confusion, depression and sleep disturbance. The patient is not nervous/anxious.     PHYSICAL EXAMINATION:  There were no vitals taken for this visit.  ECOG PERFORMANCE STATUS: {CHL ONC ECOG [id number]  Physical Exam  Constitutional: Oriented to person, place, and time and well-developed, well-nourished, and in no distress. No distress.  HENT:  Head: Normocephalic and atraumatic.  Mouth/Throat: Oropharynx is clear and moist. No oropharyngeal exudate.  Eyes: Conjunctivae are normal. Right eye exhibits no discharge. Left eye exhibits no discharge. No scleral icterus.  Neck: Normal range of motion. Neck supple.  Cardiovascular: Normal rate, regular rhythm, normal heart sounds and intact distal pulses.   Pulmonary/Chest: Effort normal and breath sounds normal. No respiratory distress. No wheezes. No rales.  Abdominal: Soft. Bowel sounds are normal. Exhibits no distension and no mass.  There is no tenderness.  Musculoskeletal: Normal range of motion. Exhibits no edema.  Lymphadenopathy:    No cervical adenopathy.  Neurological: Alert and oriented to person, place, and time. Exhibits normal muscle tone. Gait normal. Coordination normal.  Skin: Skin is warm and dry. No rash noted. Not diaphoretic. No erythema. No pallor.  Psychiatric: Mood, memory and judgment normal.  Vitals reviewed.  LABORATORY DATA: Lab Results  Component Value Date   WBC 6.6 02/11/2024   HGB 10.3 (L) 02/11/2024   HCT 32.6 (L) 02/11/2024   MCV 89.3 02/11/2024   PLT 190 02/11/2024      Chemistry      Component Value  Date/Time   NA 141 02/11/2024 1454   NA 142 07/16/2023 1001   NA 140 09/07/2017 1322   K 5.0 02/11/2024 1454   K 5.2 No visable hemolysis (H) 09/07/2017 1322   CL 109 02/11/2024 1454   CO2 26 02/11/2024 1454   CO2 33 (H) 09/07/2017 1322   BUN 46 (H) 02/11/2024 1454   BUN 28 (H) 07/16/2023 1001   BUN 13.6 09/07/2017 1322   CREATININE 1.24 02/11/2024 1454   CREATININE 1.0 09/07/2017 1322      Component Value Date/Time   CALCIUM  8.9 02/11/2024 1454   CALCIUM  9.1 09/07/2017 1322   ALKPHOS 107 02/11/2024 1454   ALKPHOS 92 09/07/2017 1322   AST 16 02/11/2024 1454   AST 11 09/07/2017 1322   ALT 17 02/11/2024 1454   ALT 8 09/07/2017 1322   BILITOT 0.4 02/11/2024 1454   BILITOT 0.47 09/07/2017 1322       RADIOGRAPHIC STUDIES:  DG Wrist Complete Left Result Date: 01/24/2024 CLINICAL DATA:  Left wrist pain for several months. Worsened over last 2 months. EXAM: LEFT WRIST - COMPLETE 3+ VIEW COMPARISON:  Left hand radiographs 01/31/2005 FINDINGS: There is severe radial-scaphoid joint space narrowing and bone-on-bone contact and subchondral sclerosis. Mild chronic erosion of the radial side. Moderate distal radial styloid degenerative osteophytosis. Well corticated 6 x 2 mm chronic ossicle overlying the medial aspect of the triquetrum. Moderate triscaphe and moderate to severe  thumb carpometacarpal joint space narrowing, subchondral sclerosis, and peripheral osteophytosis. There is nonspecific mild curvilinear calcification lateral to the index finger metacarpophalangeal joint and lateral to the fifth finger metacarpophalangeal joint. No acute fracture or dislocation. IMPRESSION: 1. Severe radial-scaphoid osteoarthritis. 2. Moderate triscaphe and moderate to severe thumb carpometacarpal osteoarthritis. Electronically Signed   By: Bertina Broccoli M.D.   On: 01/24/2024 17:57     ASSESSMENT/PLAN:  This is a very pleasant 73 year old Caucasian male with 1) stage IIIA large B-cell non-Hodgkin lymphoma status post 6 cycles of systemic chemotherapy with CHOP/Rituxan  with almost complete response. The patient has been on observation for several years. 2) stage IA (T1c, N0, M0) non-small cell lung cancer, squamous cell carcinoma presented with right upper lobe pulmonary mass.  This was found on repeat imaging studies with CT scan of the chest as well as a PET scan that showed hypermetabolic activity in the right upper lobe lung mass with no evidence for lymphadenopathy or distant metastatic disease.   The patient underwent SBRT to the recurrent non-small cell lung cancer, squamous cell carcinoma of the right upper lobe. He tolerated the procedure well.    CT scan from May 2024 showed RUL post radition changes with increased adjacent right apical consolidation, likely due to atelectasis, but PET was recommended to rule out disease recurrence. Therefore, he had a PET on 5/24 to further evaluate this. This did not have hypermetabolic activity but there was focal hypermetabolism in the cecum for which he had colonoscopy showing polyps.   The patient was seen with Dr. Marguerita Shih today.  Dr. Marguerita Shih personally and independently reviewed the scan and discussed results with the patient today.  The scan showed ***.  Dr. Marguerita Shih recommends ***  Dr. Marguerita Shih recommends he continue on observation  with a restaging CT scan in 6 months ***or 12  The patient was advised to call immediately if she has any concerning symptoms in the interval. The patient voices understanding of current disease status and treatment options and is in agreement with the current care plan. All  questions were answered. The patient knows to call the clinic with any problems, questions or concerns. We can certainly see the patient much sooner if necessary     No orders of the defined types were placed in this encounter.    I spent {CHL ONC TIME VISIT - YNWGN:[id number]} counseling the patient face to face. The total time spent in the appointment was {CHL ONC TIME VISIT - QIONG:[id number]}.  Wylan Gentzler L Nyssa Sayegh, PA-C 02/20/24

## 2024-02-21 ENCOUNTER — Ambulatory Visit (INDEPENDENT_AMBULATORY_CARE_PROVIDER_SITE_OTHER): Admitting: Student

## 2024-02-21 ENCOUNTER — Other Ambulatory Visit (HOSPITAL_BASED_OUTPATIENT_CLINIC_OR_DEPARTMENT_OTHER): Payer: Self-pay

## 2024-02-21 ENCOUNTER — Encounter (HOSPITAL_BASED_OUTPATIENT_CLINIC_OR_DEPARTMENT_OTHER): Payer: Self-pay | Admitting: Student

## 2024-02-21 VITALS — BP 118/62 | HR 62 | Temp 98.1°F | Resp 16 | Ht 70.0 in | Wt 212.3 lb

## 2024-02-21 DIAGNOSIS — G720 Drug-induced myopathy: Secondary | ICD-10-CM | POA: Insufficient documentation

## 2024-02-21 DIAGNOSIS — J439 Emphysema, unspecified: Secondary | ICD-10-CM | POA: Diagnosis not present

## 2024-02-21 DIAGNOSIS — T466X5A Adverse effect of antihyperlipidemic and antiarteriosclerotic drugs, initial encounter: Secondary | ICD-10-CM

## 2024-02-21 DIAGNOSIS — M19032 Primary osteoarthritis, left wrist: Secondary | ICD-10-CM

## 2024-02-21 DIAGNOSIS — I5033 Acute on chronic diastolic (congestive) heart failure: Secondary | ICD-10-CM

## 2024-02-21 DIAGNOSIS — Z131 Encounter for screening for diabetes mellitus: Secondary | ICD-10-CM

## 2024-02-21 DIAGNOSIS — K5909 Other constipation: Secondary | ICD-10-CM

## 2024-02-21 DIAGNOSIS — E7800 Pure hypercholesterolemia, unspecified: Secondary | ICD-10-CM

## 2024-02-21 HISTORY — DX: Primary osteoarthritis, left wrist: M19.032

## 2024-02-21 HISTORY — DX: Other constipation: K59.09

## 2024-02-21 HISTORY — DX: Adverse effect of antihyperlipidemic and antiarteriosclerotic drugs, initial encounter: G72.0

## 2024-02-21 MED ORDER — LINZESS 290 MCG PO CAPS: 290.0000 ug | ORAL_CAPSULE | Freq: Every day | ORAL | 5 refills | Status: DC

## 2024-02-21 NOTE — Patient Instructions (Addendum)
 It was nice to see you today!  As we discussed in clinic:  I will call in your medication.  If you have any problems before your next visit feel free to message me via MyChart (minor issues or questions) or call the office, otherwise you may reach out to schedule an office visit.  Thank you! Kanon Colunga, PA-C

## 2024-02-21 NOTE — Assessment & Plan Note (Signed)
 Chronic, stable. Emphysema managed with inhalers, specifically Trelegy. Occasional wheezing noted, particularly when fluid retention is present. Oxygen  saturation is currently stable. He is aware of signs of fluid retention and manages symptoms accordingly. - Continue current inhaler regimen.

## 2024-02-21 NOTE — Assessment & Plan Note (Addendum)
 Chronic, unsure if he is at goal. Currently only on zetia . History of statin myopathy. If  LDL is >200, may be a good candidate for PCSK9 inhibitor. Will assess with lipid panel.

## 2024-02-21 NOTE — Assessment & Plan Note (Addendum)
 Chronic, stable. Chronic constipation managed with Linzess . He reports regular bowel movements and no current issues with constipation. Linzess  is taken every morning before breakfast. He has been taking it every other day due to running low on medication. - Continue Linzess  as prescribed. - Refill linzess .

## 2024-02-21 NOTE — Assessment & Plan Note (Signed)
 Chronic with exacerbation. Rales on PE and 1+ pitting edema. Intermittent fluid retention with recent weight gain of three pounds over three days. Slight fluid noted in ankles and legs. He is instructed to take Lasix  if weight increases by three pounds in three days. Current dosage is 20 mg daily, increased to 40 mg for three days as needed. He monitors weight daily and elevates legs during sleep to reduce swelling. - Continue Lasix  20 mg daily, increase to 40 mg for three days if weight increases by three pounds in three days as directed by cardiology. - Order fasting cholesterol panel and A1c to assess cardiovascular risk and diabetes. - Continue to follow with cardiology.

## 2024-02-21 NOTE — Assessment & Plan Note (Signed)
 Chronic, stable. Severe arthritis in the wrist, described as 'worn out'. He received a corticosteroid injection and is undergoing therapy. Surgery was discussed as an option but may result in loss of strength. The duration of relief from the injection is uncertain. - Follow up with hand surgeon in approximately three months.

## 2024-02-21 NOTE — Progress Notes (Signed)
 Established Patient Office Visit  Subjective   Patient ID: [name], male    DOB: [birth year]  Age: 73 y.o. MRN: [id number]  Chief Complaint  Patient presents with   Medical Management of Chronic Issues    Here for follow up. Wrist feels better. Need refills on linzess  & lasix .     HPI  Discussed the use of AI scribe software for clinical note transcription with the patient, who gave verbal consent to proceed.  History of Present Illness   [name] is a 73 year old male who presents for follow-up after receiving an injection for severe wrist arthritis.  He received an injection for severe arthritis in his wrist, which has been effective in managing symptoms. He remains active and is able to do the things he enjoys. He is unsure about the duration of the injection's effectiveness and plans to follow up in approximately three months.  He has a history of fluid retention and has been monitoring his weight closely. He notes a weight gain of three pounds over the last three days, prompting him to take an additional dose of Lasix . He takes 20 mg of Lasix  daily and increases to 40 mg for three days if he gains three pounds in three days. He notes some swelling in his legs, which he manages by elevating his legs at night. No significant swelling in the thighs.  He is on Linzess  for chronic constipation, taking it every morning at 7 AM. He has been experiencing regular bowel movements and has recently started taking the medication every other day due to running low on supply. No diarrhea, stomach pain, bloating, or vomiting, but he does experience some gas.  He uses inhalers for emphysema and finds them effective. He notices wheezing when he retains fluid, which he associates with fluid in his lungs. Oxygen  saturation is good.  He has a history of muscle pain with statin use, specifically Crestor , which was severe enough to require assistance with daily activities. He is not currently  on a statin due to these side effects.  He is in contact with the cancer center and has a CT scan scheduled, with a follow-up appointment on the 5th. He reports some irregular bleeding and bruising, which he attributes to physical activities such as working with a tractor and handling mulch.      Reviewed ortho note from 02/08/2024.  Patient Active Problem List   Diagnosis Date Noted   Statin myopathy 02/21/2024   Chronic constipation 02/21/2024   Primary osteoarthritis of left distal radioulnar joint 02/21/2024   Primary osteoarthritis of right distal radioulnar joint 01/28/2024   Idiopathic chronic gout of multiple sites without tophus 01/24/2024   Cubital tunnel syndrome on right 07/09/2023   COPD with acute exacerbation (HCC) 05/26/2023   Acute diastolic CHF (congestive heart failure) (HCC) 05/07/2023   Acute on chronic diastolic CHF (congestive heart failure) (HCC) 05/06/2023   Acute on chronic respiratory failure with hypoxia (HCC) 05/06/2023   DOE (dyspnea on exertion) 04/07/2023   Congestive heart failure (HCC) 01/26/2023   GERD (gastroesophageal reflux disease) 01/26/2023   Obesity 01/26/2023   Polycythemia 01/26/2023   Pulmonary hypertension (HCC) 01/26/2023   Aortic atherosclerosis (HCC) 01/26/2023   Coronary artery calcification 01/26/2023   Carpal tunnel syndrome, right upper limb 11/03/2022   Lesion of ulnar nerve, right upper limb 11/03/2022   Numbness of right hand 06/28/2022   Status post bronchoscopy with biopsy    Port catheter in place 08/20/2016  NHL (non-Hodgkin's lymphoma) (HCC) 09/25/2015   Lymphadenopathy syndrome 09/12/2015   Mediastinal adenopathy 09/12/2015   Chronic respiratory failure with hypoxia (HCC) 08/27/2015   Hypothyroidism 03/30/2012   Depression 12/03/2011   Pure hypercholesterolemia 12/03/2011   Atrial fibrillation (HCC) 10/26/2011   Other dysphagia 11/11/2010   Squamous cell carcinoma lung (HCC) 09/26/2010   BPH (benign prostatic  hyperplasia) 2011   ERECTILE DYSFUNCTION 09/24/2008   Essential hypertension 06/05/2008   COPD (chronic obstructive pulmonary disease) (HCC) 06/05/2008   Past Medical History:  Diagnosis Date   Abnormal finding on GI tract imaging 09/07/2023   Acute diastolic CHF (congestive heart failure) (HCC) 05/07/2023   Acute on chronic diastolic CHF (congestive heart failure) (HCC) 05/06/2023   Acute on chronic respiratory failure with hypoxia (HCC) 05/06/2023   Aortic atherosclerosis (HCC) 01/26/2023   Arthritis    Atrial fibrillation (HCC) 10/26/2011   Benign neoplasm of colon 09/07/2023   BPH (benign prostatic hyperplasia) 2011   BRONCHOPNEUMONIA ORGANISM UNSPECIFIED 06/12/2010         Replacing diagnoses that were inactivated after the 12/21/22 regulatory import   Carpal tunnel syndrome, right upper limb 11/03/2022   Chronic respiratory failure with hypoxia (HCC) 08/27/2015   Congestive heart failure (HCC)    COPD (chronic obstructive pulmonary disease) (HCC)    COPD with acute exacerbation (HCC) 05/26/2023   Coronary artery calcification 01/26/2023   Cubital tunnel syndrome on right 07/09/2023   Depression    DOE (dyspnea on exertion) 04/07/2023   Essential hypertension 06/05/2008   Qualifier: Diagnosis of   By: Jayson Michael Nonie Beady), [name]       GERD (gastroesophageal reflux disease)    Hypothyroidism 03/30/2012   Lesion of ulnar nerve, right upper limb 11/03/2022   Lymphadenopathy syndrome 09/12/2015   Mediastinal adenopathy 09/12/2015   NHL (non-Hodgkin's lymphoma) (HCC) 09/25/2015   Numbness of right hand 06/28/2022   Obesity    Open wound of right hand without foreign body 07/27/2021   Other dysphagia 11/11/2010   Qualifier: Diagnosis of   By: Bertrum Brodie MD, [name]       Polycythemia 01/26/2023   Port catheter in place 08/20/2016   Pulmonary hypertension (HCC)    Pure hypercholesterolemia 12/03/2011   Recurrent pneumonia 02/10/2012   Routine general medical examination at a  health care facility 12/03/2011   Squamous cell carcinoma lung (HCC) 09/26/2010   S/p LUL resection Dr Percy Bracken dec 2011      Status post bronchoscopy with biopsy    Stiffness of finger joint of right hand 09/24/2021   Testicular swelling 09/12/2015   Social History   Tobacco Use   Smoking status: Former    Current packs/day: 0.00    Average packs/day: 3.0 packs/day for 43.0 years (129.0 ttl pk-yrs)    Types: Cigarettes    Start date: 10/22/1958    Quit date: 10/22/2001    Years since quitting: 22.3    Passive exposure: Current   Smokeless tobacco: Never  Vaping Use   Vaping status: Never Used  Substance Use Topics   Alcohol  use: No   Drug use: No   Allergies  Allergen Reactions   Lipitor [Atorvastatin ] Other (See Comments)    Myalgias  Weakness  Fatigue    Omnipaque  [Iohexol ] Itching and Other (See Comments)    When asked about contrast media today, pt stated that last time he had the contrast that he was red all over and itchy from head to toe.       ROS Per HPI.  Objective:     BP 118/62   Pulse 62   Temp 98.1 F (36.7 C) (Oral)   Resp 16   Ht 5\' 10"  (1.778 m)   Wt 212 lb 4.8 oz (96.3 kg)   SpO2 95%   BMI 30.46 kg/m  BP Readings from Last 3 Encounters:  02/21/24 118/62  01/24/24 121/71  01/18/24 (!) 108/48   Wt Readings from Last 3 Encounters:  02/21/24 212 lb 4.8 oz (96.3 kg)  01/24/24 209 lb 11.2 oz (95.1 kg)  11/11/23 206 lb (93.4 kg)      Physical Exam Constitutional:      General: He is not in acute distress.    Appearance: Normal appearance. He is not ill-appearing.  HENT:     Head: Normocephalic and atraumatic.     Right Ear: External ear normal.     Left Ear: External ear normal.     Nose: Nose normal.  Eyes:     Conjunctiva/sclera: Conjunctivae normal.  Cardiovascular:     Rate and Rhythm: Normal rate and regular rhythm.     Pulses: Normal pulses.     Heart sounds: Normal heart sounds. No murmur heard.    No friction rub.   Pulmonary:     Effort: Pulmonary effort is normal. No respiratory distress.     Breath sounds: Rales present. No wheezing or rhonchi.  Musculoskeletal:     Right lower leg: Edema (1+) present.     Left lower leg: Edema (1+) present.  Skin:    General: Skin is warm and dry.     Coloration: Skin is not jaundiced or pale.  Neurological:     Mental Status: He is alert.  Psychiatric:        Mood and Affect: Mood normal.        Behavior: Behavior normal.      No results found for any visits on 02/21/24.  Last CBC Lab Results  Component Value Date   WBC 6.6 02/11/2024   HGB 10.3 (L) 02/11/2024   HCT 32.6 (L) 02/11/2024   MCV 89.3 02/11/2024   MCH 28.2 02/11/2024   RDW 14.3 02/11/2024   PLT 190 02/11/2024   Last metabolic panel Lab Results  Component Value Date   GLUCOSE 131 (H) 02/11/2024   NA 141 02/11/2024   K 5.0 02/11/2024   CL 109 02/11/2024   CO2 26 02/11/2024   BUN 46 (H) 02/11/2024   CREATININE 1.24 02/11/2024   GFRNONAA >60 02/11/2024   CALCIUM  8.9 02/11/2024   PHOS 4.4 10/05/2023   PROT 6.6 02/11/2024   ALBUMIN 4.1 02/11/2024   LABGLOB 2.1 04/07/2023   BILITOT 0.4 02/11/2024   ALKPHOS 107 02/11/2024   AST 16 02/11/2024   ALT 17 02/11/2024   ANIONGAP 6 02/11/2024   Last lipids Lab Results  Component Value Date   CHOL 169 04/07/2023   HDL 40 04/07/2023   LDLCALC 116 (H) 04/07/2023   LDLDIRECT 99 05/18/2023   TRIG 65 04/07/2023   CHOLHDL 4.2 04/07/2023   Last hemoglobin A1c No results found for: "HGBA1C" Last vitamin B12 and Folate Lab Results  Component Value Date   VITAMINB12 503 02/14/2023   FOLATE >40.0 02/14/2023      The 10-year ASCVD risk score (Arnett DK, et al., 2019) is: 22.5%    Assessment & Plan:   Chronic constipation Assessment & Plan: Chronic, stable. Chronic constipation managed with Linzess . He reports regular bowel movements and no current issues with constipation. Linzess  is taken every  morning before breakfast. He  has been taking it every other day due to running low on medication. - Continue Linzess  as prescribed. - Refill linzess .   Orders: -     Linzess ; Take 1 capsule (290 mcg total) by mouth daily before breakfast.  Dispense: 30 capsule; Refill: 5  Primary osteoarthritis of left distal radioulnar joint Assessment & Plan: Chronic, stable. Severe arthritis in the wrist, described as 'worn out'. He received a corticosteroid injection and is undergoing therapy. Surgery was discussed as an option but may result in loss of strength. The duration of relief from the injection is uncertain. - Follow up with hand surgeon in approximately three months.  Orders: -     Linzess ; Take 1 capsule (290 mcg total) by mouth daily before breakfast.  Dispense: 30 capsule; Refill: 5  Pulmonary emphysema, unspecified emphysema type (HCC) Assessment & Plan: Chronic, stable. Emphysema managed with inhalers, specifically Trelegy. Occasional wheezing noted, particularly when fluid retention is present. Oxygen  saturation is currently stable. He is aware of signs of fluid retention and manages symptoms accordingly. - Continue current inhaler regimen.   Statin myopathy  Acute on chronic diastolic CHF (congestive heart failure) (HCC) Assessment & Plan: Chronic with exacerbation. Rales on PE and 1+ pitting edema. Intermittent fluid retention with recent weight gain of three pounds over three days. Slight fluid noted in ankles and legs. He is instructed to take Lasix  if weight increases by three pounds in three days. Current dosage is 20 mg daily, increased to 40 mg for three days as needed. He monitors weight daily and elevates legs during sleep to reduce swelling. - Continue Lasix  20 mg daily, increase to 40 mg for three days if weight increases by three pounds in three days as directed by cardiology. - Order fasting cholesterol panel and A1c to assess cardiovascular risk and diabetes. - Continue to follow with  cardiology.   Screening for diabetes mellitus -     Hemoglobin A1c; Future  Pure hypercholesterolemia Assessment & Plan: Chronic, unsure if he is at goal. Currently only on zetia . History of statin myopathy. If  LDL is >200, may be a good candidate for PCSK9 inhibitor. Will assess with lipid panel.  Orders: -     Lipid panel; Future   Return in about 3 months (around 05/23/2024) for Chronic Followup.    Tazia Illescas T Maytal Mijangos, PA-C

## 2024-02-23 ENCOUNTER — Other Ambulatory Visit (HOSPITAL_BASED_OUTPATIENT_CLINIC_OR_DEPARTMENT_OTHER): Payer: Self-pay

## 2024-02-24 ENCOUNTER — Encounter: Payer: Self-pay | Admitting: Internal Medicine

## 2024-02-24 ENCOUNTER — Inpatient Hospital Stay: Payer: PPO | Attending: Internal Medicine | Admitting: Physician Assistant

## 2024-02-24 VITALS — BP 108/54 | HR 67 | Temp 98.5°F | Resp 13 | Wt 210.6 lb

## 2024-02-24 DIAGNOSIS — E7800 Pure hypercholesterolemia, unspecified: Secondary | ICD-10-CM | POA: Diagnosis not present

## 2024-02-24 DIAGNOSIS — J449 Chronic obstructive pulmonary disease, unspecified: Secondary | ICD-10-CM | POA: Insufficient documentation

## 2024-02-24 DIAGNOSIS — C3411 Malignant neoplasm of upper lobe, right bronchus or lung: Secondary | ICD-10-CM | POA: Insufficient documentation

## 2024-02-24 DIAGNOSIS — I4891 Unspecified atrial fibrillation: Secondary | ICD-10-CM | POA: Insufficient documentation

## 2024-02-24 DIAGNOSIS — C3491 Malignant neoplasm of unspecified part of right bronchus or lung: Secondary | ICD-10-CM

## 2024-02-24 DIAGNOSIS — I700 Atherosclerosis of aorta: Secondary | ICD-10-CM | POA: Diagnosis not present

## 2024-02-24 DIAGNOSIS — Z452 Encounter for adjustment and management of vascular access device: Secondary | ICD-10-CM | POA: Insufficient documentation

## 2024-02-24 DIAGNOSIS — I2510 Atherosclerotic heart disease of native coronary artery without angina pectoris: Secondary | ICD-10-CM | POA: Insufficient documentation

## 2024-02-24 DIAGNOSIS — E039 Hypothyroidism, unspecified: Secondary | ICD-10-CM | POA: Diagnosis not present

## 2024-02-24 DIAGNOSIS — Z79899 Other long term (current) drug therapy: Secondary | ICD-10-CM | POA: Insufficient documentation

## 2024-02-24 DIAGNOSIS — I110 Hypertensive heart disease with heart failure: Secondary | ICD-10-CM | POA: Insufficient documentation

## 2024-02-24 DIAGNOSIS — Z79890 Hormone replacement therapy (postmenopausal): Secondary | ICD-10-CM | POA: Insufficient documentation

## 2024-02-24 DIAGNOSIS — M129 Arthropathy, unspecified: Secondary | ICD-10-CM | POA: Diagnosis not present

## 2024-02-24 DIAGNOSIS — M1990 Unspecified osteoarthritis, unspecified site: Secondary | ICD-10-CM | POA: Diagnosis not present

## 2024-02-24 DIAGNOSIS — Z9221 Personal history of antineoplastic chemotherapy: Secondary | ICD-10-CM | POA: Insufficient documentation

## 2024-02-24 DIAGNOSIS — Z7901 Long term (current) use of anticoagulants: Secondary | ICD-10-CM | POA: Insufficient documentation

## 2024-02-24 DIAGNOSIS — I272 Pulmonary hypertension, unspecified: Secondary | ICD-10-CM | POA: Insufficient documentation

## 2024-02-24 DIAGNOSIS — R161 Splenomegaly, not elsewhere classified: Secondary | ICD-10-CM | POA: Insufficient documentation

## 2024-03-01 ENCOUNTER — Other Ambulatory Visit (HOSPITAL_BASED_OUTPATIENT_CLINIC_OR_DEPARTMENT_OTHER)

## 2024-03-01 DIAGNOSIS — E7800 Pure hypercholesterolemia, unspecified: Secondary | ICD-10-CM

## 2024-03-01 DIAGNOSIS — Z131 Encounter for screening for diabetes mellitus: Secondary | ICD-10-CM

## 2024-03-01 NOTE — Progress Notes (Signed)
 Patient is in office today for a nurse visit for labs.

## 2024-03-02 ENCOUNTER — Ambulatory Visit (HOSPITAL_BASED_OUTPATIENT_CLINIC_OR_DEPARTMENT_OTHER): Payer: Self-pay | Admitting: Student

## 2024-03-02 LAB — LIPID PANEL
Chol/HDL Ratio: 3.6 ratio (ref 0.0–5.0)
Cholesterol, Total: 156 mg/dL (ref 100–199)
HDL: 43 mg/dL (ref >39)
LDL Chol Calc (NIH): 103 mg/dL — ABNORMAL HIGH (ref 0–99)
Triglycerides: 48 mg/dL (ref 0–149)
VLDL Cholesterol Cal: 10 mg/dL (ref 5–40)

## 2024-03-02 LAB — HEMOGLOBIN A1C
Est. average glucose Bld gHb Est-mCnc: 114 mg/dL
Hgb A1c MFr Bld: 5.6 % (ref 4.8–5.6)

## 2024-03-08 ENCOUNTER — Encounter: Payer: Self-pay | Admitting: Internal Medicine

## 2024-03-13 ENCOUNTER — Other Ambulatory Visit: Payer: Self-pay | Admitting: Cardiology

## 2024-03-14 ENCOUNTER — Encounter: Payer: Self-pay | Admitting: Internal Medicine

## 2024-03-21 ENCOUNTER — Ambulatory Visit: Admitting: Internal Medicine

## 2024-03-21 ENCOUNTER — Encounter: Payer: Self-pay | Admitting: Internal Medicine

## 2024-03-21 VITALS — BP 110/66 | HR 66 | Ht 72.0 in | Wt 213.8 lb

## 2024-03-21 DIAGNOSIS — Z87891 Personal history of nicotine dependence: Secondary | ICD-10-CM

## 2024-03-21 DIAGNOSIS — J449 Chronic obstructive pulmonary disease, unspecified: Secondary | ICD-10-CM

## 2024-03-21 DIAGNOSIS — J441 Chronic obstructive pulmonary disease with (acute) exacerbation: Secondary | ICD-10-CM | POA: Diagnosis not present

## 2024-03-21 LAB — CBC WITH DIFFERENTIAL/PLATELET
Basophils Absolute: 0 10*3/uL (ref 0.0–0.1)
Basophils Relative: 0.3 % (ref 0.0–3.0)
Eosinophils Absolute: 0.1 10*3/uL (ref 0.0–0.7)
Eosinophils Relative: 2.4 % (ref 0.0–5.0)
HCT: 32 % — ABNORMAL LOW (ref 39.0–52.0)
Hemoglobin: 10.3 g/dL — ABNORMAL LOW (ref 13.0–17.0)
Lymphocytes Relative: 18 % (ref 12.0–46.0)
Lymphs Abs: 0.9 10*3/uL (ref 0.7–4.0)
MCHC: 32.1 g/dL (ref 30.0–36.0)
MCV: 87.9 fl (ref 78.0–100.0)
Monocytes Absolute: 0.4 10*3/uL (ref 0.1–1.0)
Monocytes Relative: 8.9 % (ref 3.0–12.0)
Neutro Abs: 3.5 10*3/uL (ref 1.4–7.7)
Neutrophils Relative %: 70.4 % (ref 43.0–77.0)
Platelets: 171 10*3/uL (ref 150.0–400.0)
RBC: 3.64 Mil/uL — ABNORMAL LOW (ref 4.22–5.81)
RDW: 15.9 % — ABNORMAL HIGH (ref 11.5–15.5)
WBC: 5 10*3/uL (ref 4.0–10.5)

## 2024-03-21 NOTE — Progress Notes (Signed)
 OV 10/05/2023  Subjective:  Patient ID: [name] [name], male , DOB: 02-24-1951 , age 73 y.o. , MRN: [id number] , ADDRESS: [address] [place] [address] PCP Cleotilde Bernardino Hutchinson, PA Patient Care Team: Cleotilde Bernardino Hutchinson, [name] as PCP - General (Physician Assistant) Cornelius Wanda DEL, MD as Consulting Physician (Hematology and Oncology)  This Provider for this visit: Treatment Team:  Attending Provider: Geronimo Amel, MD    10/05/2023 -   Chief Complaint  Patient presents with   Follow-up    Pft f/u      HPI [name] 73 y.o. -returns for follow-up.  Last visit.  Saw me after having had a flareup and admission since then no hospitalizations no emergency room visits no prednisone  no urgent care visits.  He has seen Dr. Sherrod for his lung cancer surveillance.  He had a CT chest December 2020 for that I personally visualized he has right upper lobe old consolidation but other than that no evidence of cancer recurrence.  He states that he had low magnesium  and this was corrected in September 2024 and since then his health improved but he wants to get chemistry panel checked today.  This visit wanted to make sure his lung function was stable therefore we got a pulmonary function test but surprisingly his FEV1 and DLCO are much improved compared to 2017 he was pleasantly surprised.  He started joking that he was going to join the track team.  At this point we resolved that he would just continue Trelegy and we would keep an eye.  External record reviwed     CT Chest data from date: December 2024  - personally visualized and independently interpreted : Yes - my findings are: Right upper consolidation old and no evidence of cancer recurrence.  Latest Reference Range & Units 05/01/08 06:10 05/29/08 10:51 06/26/08 15:04 07/24/08 13:48 02/26/09 08:43 09/26/10 09:13 10/19/10 15:54 10/20/10 07:05 11/23/11 12:44 09/30/15 07:20 09/30/15 12:23 10/08/15 13:59 10/15/15 11:52  10/22/15 08:38 10/29/15 11:04 11/05/15 11:14 11/12/15 08:05 11/19/15 10:51 11/26/15 11:14 12/03/15 08:31 12/10/15 08:38 12/17/15 08:33 12/24/15 08:13 12/31/15 08:46 01/07/16 09:04 01/14/16 08:44 01/14/16 10:29 01/29/16 09:46 02/05/16 09:06 02/12/16 09:06 02/19/16 09:09 02/27/16 09:18 05/28/16 09:20 08/27/16 08:53 03/02/17 07:40 09/07/17 13:22 09/30/17 04:21 10/01/17 04:28 10/02/17 05:00 03/10/18 08:26 09/06/18 08:25 03/09/19 09:59 09/07/19 08:12 10/03/19 12:36 02/12/20 10:02 06/14/20 09:25 11/07/20 14:50 12/13/20 10:26 03/10/21 12:55 09/08/21 07:35 07/07/22 14:44 07/10/22 16:47 02/11/23 10:42 02/12/23 09:40 05/06/23 16:03 05/26/23 21:21 06/22/23 10:26 08/25/23 15:20  Eosinophils Absolute 0.0 - 0.5 K/uL 0.0 0.5 0.2 0.2 0.2 0.1 0.0 0.1 0.3 0.2 0.2 0.3 0.1 0.1 0.1 0.1 0.2 0.1 0.1 0.1 0.1 0.1 0.0 0.1 0.1 0.1 0.0 0.4 0.5 0.3 0.1 0.1 0.3 0.2 0.2 0.3 0.0 0.2 0.2 0.3 0.1 0.2 0.1 0.3 0.1 0.2 0.0 0.4 0.2 0.2 0.3 0.3 0.1 0.1 0.0 0.2 0.1 0.0     IMPRESSION: 1. Stable dense right apical consolidation surrounding the fiducials and likely radiation change and chronic bronchial obstruction. 2. Stable borderline enlarged mediastinal nodes. 3. No findings metastatic disease. 4. Stable 10 mm right adrenal gland adenoma. 5. Stable complex lesion involving the spleen. 6. Stable simple bilateral renal cysts and upper pole right renal calculi.   Aortic Atherosclerosis (ICD10-I70.0) and Emphysema (ICD10-J43.9).     Electronically Signed   By: MYRTIS Stammer M.D.   On: 09/04/2023 10:17 PFT  OV 03/21/2024  Subjective:  Patient ID: [name] [name], male , DOB: 1951-05-23 ,  age 73 y.o. , MRN: [id number] , ADDRESS: [address] [place] [facility] [address] PCP [name], Lang DASEN, PA-C Patient Care Team: [name], Jacob T, PA-C as PCP - General (Physician Assistant) Cornelius Wanda DEL, MD as Consulting Physician (Hematology and Oncology)  This Provider for this visit: Treatment Team:  Attending Provider: Geronimo Amel, MD    03/21/2024 -   Chief Complaint  Patient presents with   Follow-up    Breathing is overall stable. He coughs up some light grey sputum in the am.      HPI [name] 73 y.o. -returns for routine follow-up.  Since his last visit he had a CT scan of the chest that a new 4 mm nodules.  Has upcoming follow-up with Dr. Sherrod.  From a respiratory standpoint he is stable without any flareup.  His symptoms show COPD CAT score of 8.  Nevertheless he is interested in evaluating Dupixent is a therapy for him.  He is open to getting CBC with differential.  Of note he is also on lisinopril  which could cause chronic cough.  He does not much complains about chronic cough.      03/21/2024    1:16 PM  CAT Score  Total CAT Score 8      Simple office walk 224 (66+46 x 2) feet Pod A at [address] x  3 laps goal with forehead probe 07/12/2023    O2 used ra   Number laps completed Just 1   Comments about pace normal   Resting Pulse Ox/HR 96% and x/min   Final Pulse Ox/HR 94% and x/min   Desaturated </= 88% no   Desaturated <= 3% points no   Got Tachycardic >/= 90/min nx   Symptoms at end of test no   Miscellaneous comments no        Latest Reference Range & Units 05/01/08 06:10 05/29/08 10:51 06/26/08 15:04 07/24/08 13:48 02/26/09 08:43 09/26/10 09:13 10/19/10 15:54 10/20/10 07:05 11/23/11 12:44 09/30/15 07:20 09/30/15 12:23 10/08/15 13:59 10/15/15 11:52 10/22/15 08:38 10/29/15 11:04 11/05/15 11:14 11/12/15 08:05 11/19/15 10:51 11/26/15 11:14 12/03/15 08:31 12/10/15 08:38 12/17/15 08:33 12/24/15 08:13 12/31/15 08:46 01/07/16 09:04 01/14/16 08:44 01/14/16 10:29 01/29/16 09:46 02/05/16 09:06 02/12/16 09:06 02/19/16 09:09 02/27/16 09:18 05/28/16 09:20 08/27/16 08:53 03/02/17 07:40 09/07/17 13:22 09/30/17 04:21 10/01/17 04:28 10/02/17 05:00 03/10/18 08:26 09/06/18 08:25 03/09/19 09:59 09/07/19 08:12 10/03/19 12:36 02/12/20 10:02 06/14/20 09:25 11/07/20 14:50 12/13/20 10:26 03/10/21 12:55  09/08/21 07:35 07/07/22 14:44 07/10/22 16:47 02/11/23 10:42 02/12/23 09:40 05/06/23 16:03 05/26/23 21:21 06/22/23 10:26 08/25/23 15:20 02/11/24 14:54  Eosinophils Absolute 0.0 - 0.5 K/uL 0.0 0.5 0.2 0.2 0.2 0.1 0.0 0.1 0.3 0.2 0.2 0.3 0.1 0.1 0.1 0.1 0.2 0.1 0.1 0.1 0.1 0.1 0.0 0.1 0.1 0.1 0.0 0.4 0.5 0.3 0.1 0.1 0.3 0.2 0.2 0.3 0.0 0.2 0.2 0.3 0.1 0.2 0.1 0.3 0.1 0.2 0.0 0.4 0.2 0.2 0.3 0.3 0.1 0.1 0.0 0.2 0.1 0.0 0.1    PFT     Latest Ref Rng & Units 10/05/2023    9:39 AM 12/18/2015    9:44 AM  PFT Results  FVC-Pre L 3.02  2.75   FVC-Predicted Pre % 64  55   FVC-Post L  2.99   FVC-Predicted Post %  60   Pre FEV1/FVC % % 57  56   Post FEV1/FCV % %  56   FEV1-Pre L 1.74  1.53   FEV1-Predicted Pre % 51  41   FEV1-Post L  1.66  DLCO uncorrected ml/min/mmHg 15.41  11.85   DLCO UNC% % 56  33   DLCO corrected ml/min/mmHg 17.64  13.96   DLCO COR %Predicted % 65  39   DLVA Predicted % 88  65   TLC L  6.25   TLC % Predicted %  84   RV % Predicted %  143        LAB RESULTS last 96 hours No results found.       has a past medical history of Abnormal finding on GI tract imaging (09/07/2023), Acute diastolic CHF (congestive heart failure) (HCC) (05/07/2023), Acute on chronic diastolic CHF (congestive heart failure) (HCC) (05/06/2023), Acute on chronic respiratory failure with hypoxia (HCC) (05/06/2023), Aortic atherosclerosis (HCC) (01/26/2023), Arthritis, Atrial fibrillation (HCC) (10/26/2011), Benign neoplasm of colon (09/07/2023), BPH (benign prostatic hyperplasia) (2011), BRONCHOPNEUMONIA ORGANISM UNSPECIFIED (06/12/2010), Carpal tunnel syndrome, right upper limb (11/03/2022), Chronic respiratory failure with hypoxia (HCC) (08/27/2015), Congestive heart failure (HCC), COPD (chronic obstructive pulmonary disease) (HCC), COPD with acute exacerbation (HCC) (05/26/2023), Coronary artery calcification (01/26/2023), Cubital tunnel syndrome on right (07/09/2023), Depression, DOE (dyspnea on  exertion) (04/07/2023), Essential hypertension (06/05/2008), GERD (gastroesophageal reflux disease), Hypothyroidism (03/30/2012), Lesion of ulnar nerve, right upper limb (11/03/2022), Lymphadenopathy syndrome (09/12/2015), Mediastinal adenopathy (09/12/2015), NHL (non-Hodgkin's lymphoma) (HCC) (09/25/2015), Numbness of right hand (06/28/2022), Obesity, Open wound of right hand without foreign body (07/27/2021), Other dysphagia (11/11/2010), Polycythemia (01/26/2023), Port catheter in place (08/20/2016), Pulmonary hypertension (HCC), Pure hypercholesterolemia (12/03/2011), Recurrent pneumonia (02/10/2012), Routine general medical examination at a health care facility (12/03/2011), Squamous cell carcinoma lung (HCC) (09/26/2010), Status post bronchoscopy with biopsy, Stiffness of finger joint of right hand (09/24/2021), and Testicular swelling (09/12/2015).   reports that he quit smoking about 22 years ago. His smoking use included cigarettes. He started smoking about 65 years ago. He has a 129 pack-year smoking history. He has been exposed to tobacco smoke. He has never used smokeless tobacco.  Past Surgical History:  Procedure Laterality Date   APPENDECTOMY     BACK SURGERY     CARDIOVERSION  11/27/2011   Procedure: CARDIOVERSION;  Surgeon: Aleene JINNY Passe, MD;  [facility] ENDOSCOPY;  Service: Cardiovascular;  Laterality: N/A;   COLONOSCOPY     COLONOSCOPY WITH PROPOFOL  N/A 09/07/2023   Procedure: COLONOSCOPY WITH PROPOFOL ;  Surgeon: Leigh Elspeth SQUIBB, MD;  Location: [facility] ENDOSCOPY;  Service: Gastroenterology;  Laterality: N/A;   FUDUCIAL PLACEMENT Right 10/13/2019   Procedure: Placement Of Fuducial;  Surgeon: Shelah Lamar RAMAN, MD;  Location: [facility] OR;  Service: Thoracic;  Laterality: Right;  Right Upper Lobe    LEFT HEART CATH AND CORONARY ANGIOGRAPHY N/A 04/12/2023   Procedure: LEFT HEART CATH AND CORONARY ANGIOGRAPHY;  Surgeon: Anner Alm ORN, MD;  Location: [facility] INVASIVE CV LAB;  Service:  Cardiovascular;  Laterality: N/A;   LUNG CANCER SURGERY  2011   POLYPECTOMY  09/07/2023   Procedure: POLYPECTOMY;  Surgeon: Leigh Elspeth SQUIBB, MD;  Location: [facility] ENDOSCOPY;  Service: Gastroenterology;;   UPPER GI ENDOSCOPY     VIDEO BRONCHOSCOPY WITH ENDOBRONCHIAL NAVIGATION N/A 10/13/2019   Procedure: VIDEO BRONCHOSCOPY WITH ENDOBRONCHIAL NAVIGATION;  Surgeon: Shelah Lamar RAMAN, MD;  Location: [facility] OR;  Service: Thoracic;  Laterality: N/A;   VIDEO BRONCHOSCOPY WITH ENDOBRONCHIAL ULTRASOUND N/A 10/13/2019   Procedure: VIDEO BRONCHOSCOPY WITH ENDOBRONCHIAL ULTRASOUND;  Surgeon: Shelah Lamar RAMAN, MD;  Location: [facility] OR;  Service: Thoracic;  Laterality: N/A;    Allergies  Allergen Reactions   Lipitor [Atorvastatin ] Other (See Comments)    Myalgias  Weakness  Fatigue    Omnipaque  [Iohexol ] Itching and Other (See Comments)    When asked about contrast media today, pt stated that last time he had the contrast that he was red all over and itchy from head to toe.     Immunization History  Administered Date(s) Administered   Fluad Trivalent(High Dose 65+) 05/28/2023   Influenza Whole 06/21/2009, 08/30/2011   Influenza, High Dose Seasonal PF 06/10/2017, 07/27/2018, 06/22/2019   Influenza-Unspecified 09/16/2015, 05/13/2016   PNEUMOCOCCAL CONJUGATE-20 03/03/2021   Pneumococcal Conjugate-13 08/06/2017   Pneumococcal Polysaccharide-23 05/22/2008   Tdap 12/03/2011, 12/23/2021    Family History  Problem Relation Age of Onset   Heart disease Father    Cancer Neg Hx    Diabetes Neg Hx    Stroke Neg Hx    Hypertension Neg Hx    Kidney disease Neg Hx      Current Outpatient Medications:    albuterol  (PROVENTIL ) (2.5 MG/3ML) 0.083% nebulizer solution, Take 2.5 mg by nebulization every 6 (six) hours as needed., Disp: , Rfl:    allopurinol  (ZYLOPRIM ) 300 MG tablet, Take 300 mg by mouth daily., Disp: , Rfl:    amLODipine  (NORVASC ) 5 MG tablet, Take 5 mg by mouth daily., Disp: , Rfl:    apixaban   (ELIQUIS ) 5 MG TABS tablet, Take 1 tablet by mouth twice daily, Disp: 180 tablet, Rfl: 1   Buprenorphine HCl-Naloxone HCl 8-2 MG FILM, Place 0.5-1 Film under the tongue 2 (two) times daily as needed (back pain)., Disp: , Rfl:    ciclopirox  (PENLAC ) 8 % solution, Apply topically at bedtime. Apply a thin coat over nail. Apply daily over previous coat. Remove weekly with nail polish remover., Disp: 6.6 mL, Rfl: 11   ezetimibe  (ZETIA ) 10 MG tablet, Take 10 mg by mouth daily., Disp: , Rfl:    finasteride  (PROSCAR ) 5 MG tablet, Take 5 mg by mouth daily., Disp: , Rfl:    furosemide  (LASIX ) 20 MG tablet, Take 1 tablet by mouth once daily, Disp: 30 tablet, Rfl: 11   levothyroxine  (SYNTHROID ) 175 MCG tablet, Take 175 mcg by mouth daily., Disp: , Rfl:    LINZESS  290 MCG CAPS capsule, Take 1 capsule (290 mcg total) by mouth daily before breakfast., Disp: 30 capsule, Rfl: 5   lisinopril  (PRINIVIL ,ZESTRIL ) 10 MG tablet, Take 1 tablet (10 mg total) by mouth daily., Disp: 30 tablet, Rfl: 11   Magnesium  Chloride 64 MG TABS, Take 64 mg by mouth daily in the afternoon., Disp: 90 tablet, Rfl: 3   metoprolol  succinate (TOPROL  XL) 25 MG 24 hr tablet, Take 1 tablet (25 mg total) by mouth daily., Disp: 90 tablet, Rfl: 3   omeprazole  (PRILOSEC) 20 MG capsule, Take 1 capsule (20 mg total) by mouth 2 (two) times daily., Disp: , Rfl:    ondansetron  (ZOFRAN -ODT) 4 MG disintegrating tablet, Take 1 tablet (4 mg total) by mouth every 8 (eight) hours as needed., Disp: 20 tablet, Rfl: 0   PROAIR  HFA 108 (90 Base) MCG/ACT inhaler, Inhale 2 puffs into the lungs every 6 (six) hours as needed for wheezing or shortness of breath., Disp: 3 Inhaler, Rfl: 1   TRELEGY ELLIPTA 100-62.5-25 MCG/INH AEPB, Inhale 1 puff into the lungs daily., Disp: , Rfl:    zolpidem  (AMBIEN ) 10 MG tablet, Take 10 mg by mouth at bedtime as needed for sleep., Disp: , Rfl:    ipratropium-albuterol  (DUONEB) 0.5-2.5 (3) MG/3ML SOLN, Take 3 mLs by nebulization every 6  (six) hours as needed., Disp: 360 mL,  Rfl: 1      Objective:   Vitals:   03/21/24 1318  BP: 110/66  Pulse: 66  SpO2: 97%  Weight: 213 lb 12.8 oz (97 kg)  Height: 6' (1.829 m)    Estimated body mass index is 29 kg/m as calculated from the following:   Height as of this encounter: 6' (1.829 m).   Weight as of this encounter: 213 lb 12.8 oz (97 kg).  @WEIGHTCHANGE @  [employer]   03/21/24 1318  Weight: 213 lb 12.8 oz (97 kg)     Physical Exam oks  General: No distress. Looks wel O2 at rest: no Cane present: no Sitting in wheel chair: no Frail: no Obese: no Neuro: [name] and Oriented x 3. GCS 15. Speech normal Psych: Pleasant Resp:  Barrel Chest - no.  Wheeze - no, Crackles - no, No overt respiratory distress CVS: Normal heart sounds. Murmurs - no Ext: Stigmata of Connective Tissue Disease - no HEENT: Normal upper airway. PEERL +. No post nasal drip        Assessment:       ICD-10-CM   1. COPD, severe (HCC)  J44.9 CBC w/Diff    CANCELED: CBC w/Diff    2. COPD, frequent exacerbations (HCC)  J44.1 CBC w/Diff    CANCELED: CBC w/Diff         Plan:     Patient Instructions     ICD-10-CM   1. COPD, severe (HCC)  J44.9 CBC w/Diff    CANCELED: CBC w/Diff    2. COPD, frequent exacerbations (HCC)  J44.1 CBC w/Diff    CANCELED: CBC w/Diff       Currently stable.  No flareups in 2025 because of Trelegy and overall good health.  Nevertheless having symptoms.  Discussed Dupixent as a therapy and you are interested in getting a blood eosinophils checked.  Plan  - continue trelegy - Check CBC with differential for blood eosinophil count to consider Dupixent for the future  - continue night o2     Followup   - 6 months or sooner if needed 15-minute visit   FOLLOWUP Return in about 6 months (around 09/21/2024) for 15 min visit, with Dr Geronimo, with any of the APPS.    SIGNATURE    Dr. Dorethia Geronimo, M.D., F.C.C.P,  Pulmonary and Critical  Care Medicine Staff Physician, [facility] Director - Interstitial Lung Disease  Program  Pulmonary Fibrosis [facility] at [facility] [place], [place], [address]  Pager: [phone], If no answer or between  15:00h - 7:00h: call [phone] Telephone: [phone]  1:50 PM 03/21/2024

## 2024-03-21 NOTE — Patient Instructions (Addendum)
 ICD-10-CM   1. COPD, severe (HCC)  J44.9 CBC w/Diff    CANCELED: CBC w/Diff    2. COPD, frequent exacerbations (HCC)  J44.1 CBC w/Diff    CANCELED: CBC w/Diff       Currently stable.  No flareups in 2025 because of Trelegy and overall good health.  Nevertheless having symptoms.  Discussed Dupixent as a therapy and you are interested in getting a blood eosinophils checked.  Plan  - continue trelegy - Check CBC with differential for blood eosinophil count to consider Dupixent for the future  - continue night o2     Followup   - 6 months or sooner if needed 15-minute visit

## 2024-03-23 ENCOUNTER — Encounter: Payer: Self-pay | Admitting: Internal Medicine

## 2024-03-23 ENCOUNTER — Ambulatory Visit (INDEPENDENT_AMBULATORY_CARE_PROVIDER_SITE_OTHER): Admitting: Podiatry

## 2024-03-23 DIAGNOSIS — B351 Tinea unguium: Secondary | ICD-10-CM | POA: Diagnosis not present

## 2024-03-23 NOTE — Progress Notes (Signed)
 Subjective:  Patient ID: [name], male    DOB: 10-03-1950,  MRN: [id number]  [name] presents to clinic today for fungal nail check of the right hallux toenail.  He is using the ciclopirox  lacquer on the toenail daily.  States that he does have some difficulty trying to remove this once weekly.  PCP is [name], Jacob T, PA-C.  Past Medical History:  Diagnosis Date   Abnormal finding on GI tract imaging 09/07/2023   Acute diastolic CHF (congestive heart failure) (HCC) 05/07/2023   Acute on chronic diastolic CHF (congestive heart failure) (HCC) 05/06/2023   Acute on chronic respiratory failure with hypoxia (HCC) 05/06/2023   Aortic atherosclerosis (HCC) 01/26/2023   Arthritis    Atrial fibrillation (HCC) 10/26/2011   Benign neoplasm of colon 09/07/2023   BPH (benign prostatic hyperplasia) 2011   BRONCHOPNEUMONIA ORGANISM UNSPECIFIED 06/12/2010         Replacing diagnoses that were inactivated after the 12/21/22 regulatory import   Carpal tunnel syndrome, right upper limb 11/03/2022   Chronic respiratory failure with hypoxia (HCC) 08/27/2015   Congestive heart failure (HCC)    COPD (chronic obstructive pulmonary disease) (HCC)    COPD with acute exacerbation (HCC) 05/26/2023   Coronary artery calcification 01/26/2023   Cubital tunnel syndrome on right 07/09/2023   Depression    DOE (dyspnea on exertion) 04/07/2023   Essential hypertension 06/05/2008   Qualifier: Diagnosis of   By: [name]), [name]       GERD (gastroesophageal reflux disease)    Hypothyroidism 03/30/2012   Lesion of ulnar nerve, right upper limb 11/03/2022   Lymphadenopathy syndrome 09/12/2015   Mediastinal adenopathy 09/12/2015   NHL (non-Hodgkin's lymphoma) (HCC) 09/25/2015   Numbness of right hand 06/28/2022   Obesity    Open wound of right hand without foreign body 07/27/2021   Other dysphagia 11/11/2010   Qualifier: Diagnosis of   By: [name], [name]       Polycythemia  01/26/2023   Port catheter in place 08/20/2016   Pulmonary hypertension (HCC)    Pure hypercholesterolemia 12/03/2011   Recurrent pneumonia 02/10/2012   Routine general medical examination at a health care facility 12/03/2011   Squamous cell carcinoma lung (HCC) 09/26/2010   S/p LUL resection Dr Brantley dec 2011      Status post bronchoscopy with biopsy    Stiffness of finger joint of right hand 09/24/2021   Testicular swelling 09/12/2015   Past Surgical History:  Procedure Laterality Date   APPENDECTOMY     BACK SURGERY     CARDIOVERSION  11/27/2011   Procedure: CARDIOVERSION;  Surgeon: Aleene JINNY Passe, MD;  Location: [facility] ENDOSCOPY;  Service: Cardiovascular;  Laterality: N/A;   COLONOSCOPY     COLONOSCOPY WITH PROPOFOL  N/A 09/07/2023   Procedure: COLONOSCOPY WITH PROPOFOL ;  Surgeon: Leigh Elspeth SQUIBB, MD;  Location: [facility] ENDOSCOPY;  Service: Gastroenterology;  Laterality: N/A;   FUDUCIAL PLACEMENT Right 10/13/2019   Procedure: Placement Of Fuducial;  Surgeon: Shelah Lamar RAMAN, MD;  Location: [facility] OR;  Service: Thoracic;  Laterality: Right;  Right Upper Lobe    LEFT HEART CATH AND CORONARY ANGIOGRAPHY N/A 04/12/2023   Procedure: LEFT HEART CATH AND CORONARY ANGIOGRAPHY;  Surgeon: Anner Alm ORN, MD;  Location: [facility] INVASIVE CV LAB;  Service: Cardiovascular;  Laterality: N/A;   LUNG CANCER SURGERY  2011   POLYPECTOMY  09/07/2023   Procedure: POLYPECTOMY;  Surgeon: Leigh Elspeth SQUIBB, MD;  Location: [facility] ENDOSCOPY;  Service: Gastroenterology;;  UPPER GI ENDOSCOPY     VIDEO BRONCHOSCOPY WITH ENDOBRONCHIAL NAVIGATION N/A 10/13/2019   Procedure: VIDEO BRONCHOSCOPY WITH ENDOBRONCHIAL NAVIGATION;  Surgeon: Shelah Lamar RAMAN, MD;  Location: [facility] OR;  Service: Thoracic;  Laterality: N/A;   VIDEO BRONCHOSCOPY WITH ENDOBRONCHIAL ULTRASOUND N/A 10/13/2019   Procedure: VIDEO BRONCHOSCOPY WITH ENDOBRONCHIAL ULTRASOUND;  Surgeon: Shelah Lamar RAMAN, MD;  Location: [facility] OR;  Service: Thoracic;  Laterality: N/A;    Allergies  Allergen Reactions   Lipitor [Atorvastatin ] Other (See Comments)    Myalgias  Weakness  Fatigue    Omnipaque  [Iohexol ] Itching and Other (See Comments)    When asked about contrast media today, pt stated that last time he had the contrast that he was red all over and itchy from head to toe.     Review of Systems: Negative except as noted in the HPI.  Objective:  Vascular Examination: Capillary refill time is 3-5 seconds to toes bilateral. Palpable pedal pulses b/l LE. Digital hair present b/l.    Dermatological Examination: Pedal skin with normal turgor, texture and tone b/l. No open wounds. No interdigital macerations b/l.  The right hallux nail is 3mm thick, discolored, dystrophic with subungual debris. There is pain with compression of the nail plate.  Upon removal of the nail lacquer there is approximately 50% clearing in the proximal portion of the toenail.     Latest Ref Rng & Units 03/01/2024    8:18 AM  Hemoglobin A1C  Hemoglobin-A1c 4.8 - 5.6 % 5.6    Assessment/Plan: 1. Dermatophytosis of nail    The mycotic toenail was sharply debrided with sterile nail nippers and a power debriding burr to decrease bulk/thickness and length.    Patient to continue with current treatment plan (Rx ciclopirox  lacquer every day) for the fungal nail as there is improvement noted.   Awanda CHARM Imperial, DPM, FACFAS [facility]     [address] [place], [place] [address]                Office [phone]  Fax [phone]

## 2024-03-28 ENCOUNTER — Inpatient Hospital Stay: Attending: Internal Medicine

## 2024-03-28 VITALS — BP 107/55 | HR 60 | Resp 18

## 2024-03-28 DIAGNOSIS — R0602 Shortness of breath: Secondary | ICD-10-CM | POA: Diagnosis not present

## 2024-03-28 DIAGNOSIS — Z95828 Presence of other vascular implants and grafts: Secondary | ICD-10-CM

## 2024-03-28 DIAGNOSIS — A419 Sepsis, unspecified organism: Secondary | ICD-10-CM | POA: Diagnosis not present

## 2024-03-28 MED ORDER — HEPARIN SOD (PORK) LOCK FLUSH 100 UNIT/ML IV SOLN
500.0000 [IU] | Freq: Once | INTRAVENOUS | Status: AC | PRN
  Administered 2024-03-28: 500 [IU] via INTRAVENOUS

## 2024-03-28 MED ORDER — SODIUM CHLORIDE 0.9% FLUSH
10.0000 mL | INTRAVENOUS | Status: DC | PRN
  Administered 2024-03-28: 10 mL via INTRAVENOUS

## 2024-03-30 ENCOUNTER — Other Ambulatory Visit: Payer: Self-pay

## 2024-03-30 ENCOUNTER — Inpatient Hospital Stay (HOSPITAL_COMMUNITY)
Admission: EM | Admit: 2024-03-30 | Discharge: 2024-04-02 | DRG: 871 | Disposition: A | Discharge status: Home Health | Attending: Student | Admitting: Student

## 2024-03-30 ENCOUNTER — Encounter (HOSPITAL_COMMUNITY): Payer: Self-pay | Admitting: Emergency Medicine

## 2024-03-30 ENCOUNTER — Emergency Department (HOSPITAL_COMMUNITY)

## 2024-03-30 DIAGNOSIS — F32A Depression, unspecified: Secondary | ICD-10-CM | POA: Diagnosis present

## 2024-03-30 DIAGNOSIS — Z91041 Radiographic dye allergy status: Secondary | ICD-10-CM

## 2024-03-30 DIAGNOSIS — J9621 Acute and chronic respiratory failure with hypoxia: Secondary | ICD-10-CM | POA: Diagnosis present

## 2024-03-30 DIAGNOSIS — D509 Iron deficiency anemia, unspecified: Secondary | ICD-10-CM | POA: Diagnosis present

## 2024-03-30 DIAGNOSIS — I700 Atherosclerosis of aorta: Secondary | ICD-10-CM | POA: Diagnosis present

## 2024-03-30 DIAGNOSIS — Z8249 Family history of ischemic heart disease and other diseases of the circulatory system: Secondary | ICD-10-CM

## 2024-03-30 DIAGNOSIS — R6520 Severe sepsis without septic shock: Principal | ICD-10-CM | POA: Diagnosis present

## 2024-03-30 DIAGNOSIS — E039 Hypothyroidism, unspecified: Secondary | ICD-10-CM | POA: Diagnosis present

## 2024-03-30 DIAGNOSIS — Z888 Allergy status to other drugs, medicaments and biological substances status: Secondary | ICD-10-CM

## 2024-03-30 DIAGNOSIS — Z87891 Personal history of nicotine dependence: Secondary | ICD-10-CM

## 2024-03-30 DIAGNOSIS — C8590 Non-Hodgkin lymphoma, unspecified, unspecified site: Secondary | ICD-10-CM | POA: Diagnosis present

## 2024-03-30 DIAGNOSIS — I10 Essential (primary) hypertension: Secondary | ICD-10-CM | POA: Diagnosis present

## 2024-03-30 DIAGNOSIS — J441 Chronic obstructive pulmonary disease with (acute) exacerbation: Secondary | ICD-10-CM | POA: Diagnosis present

## 2024-03-30 DIAGNOSIS — B9683 Acinetobacter baumannii as the cause of diseases classified elsewhere: Secondary | ICD-10-CM | POA: Diagnosis present

## 2024-03-30 DIAGNOSIS — C3490 Malignant neoplasm of unspecified part of unspecified bronchus or lung: Secondary | ICD-10-CM | POA: Diagnosis present

## 2024-03-30 DIAGNOSIS — J9611 Chronic respiratory failure with hypoxia: Secondary | ICD-10-CM | POA: Diagnosis present

## 2024-03-30 DIAGNOSIS — I5032 Chronic diastolic (congestive) heart failure: Secondary | ICD-10-CM | POA: Diagnosis present

## 2024-03-30 DIAGNOSIS — I272 Pulmonary hypertension, unspecified: Secondary | ICD-10-CM | POA: Diagnosis present

## 2024-03-30 DIAGNOSIS — G5621 Lesion of ulnar nerve, right upper limb: Secondary | ICD-10-CM | POA: Diagnosis present

## 2024-03-30 DIAGNOSIS — Z79890 Hormone replacement therapy (postmenopausal): Secondary | ICD-10-CM

## 2024-03-30 DIAGNOSIS — J189 Pneumonia, unspecified organism: Secondary | ICD-10-CM | POA: Diagnosis present

## 2024-03-30 DIAGNOSIS — E7800 Pure hypercholesterolemia, unspecified: Secondary | ICD-10-CM | POA: Diagnosis present

## 2024-03-30 DIAGNOSIS — K219 Gastro-esophageal reflux disease without esophagitis: Secondary | ICD-10-CM | POA: Diagnosis present

## 2024-03-30 DIAGNOSIS — R0602 Shortness of breath: Secondary | ICD-10-CM | POA: Diagnosis present

## 2024-03-30 DIAGNOSIS — I4891 Unspecified atrial fibrillation: Secondary | ICD-10-CM | POA: Diagnosis present

## 2024-03-30 DIAGNOSIS — E669 Obesity, unspecified: Secondary | ICD-10-CM | POA: Diagnosis present

## 2024-03-30 DIAGNOSIS — E875 Hyperkalemia: Secondary | ICD-10-CM | POA: Diagnosis present

## 2024-03-30 DIAGNOSIS — J449 Chronic obstructive pulmonary disease, unspecified: Secondary | ICD-10-CM | POA: Diagnosis present

## 2024-03-30 DIAGNOSIS — I480 Paroxysmal atrial fibrillation: Secondary | ICD-10-CM | POA: Diagnosis present

## 2024-03-30 DIAGNOSIS — A419 Sepsis, unspecified organism: Principal | ICD-10-CM | POA: Diagnosis present

## 2024-03-30 DIAGNOSIS — J440 Chronic obstructive pulmonary disease with acute lower respiratory infection: Secondary | ICD-10-CM | POA: Diagnosis present

## 2024-03-30 DIAGNOSIS — Z79899 Other long term (current) drug therapy: Secondary | ICD-10-CM

## 2024-03-30 DIAGNOSIS — Z923 Personal history of irradiation: Secondary | ICD-10-CM

## 2024-03-30 DIAGNOSIS — Z9981 Dependence on supplemental oxygen: Secondary | ICD-10-CM

## 2024-03-30 DIAGNOSIS — G5601 Carpal tunnel syndrome, right upper limb: Secondary | ICD-10-CM | POA: Diagnosis present

## 2024-03-30 DIAGNOSIS — D751 Secondary polycythemia: Secondary | ICD-10-CM | POA: Diagnosis present

## 2024-03-30 DIAGNOSIS — M1990 Unspecified osteoarthritis, unspecified site: Secondary | ICD-10-CM | POA: Diagnosis present

## 2024-03-30 DIAGNOSIS — I2510 Atherosclerotic heart disease of native coronary artery without angina pectoris: Secondary | ICD-10-CM | POA: Diagnosis present

## 2024-03-30 DIAGNOSIS — N179 Acute kidney failure, unspecified: Secondary | ICD-10-CM | POA: Diagnosis present

## 2024-03-30 DIAGNOSIS — N400 Enlarged prostate without lower urinary tract symptoms: Secondary | ICD-10-CM | POA: Diagnosis present

## 2024-03-30 DIAGNOSIS — Z1152 Encounter for screening for COVID-19: Secondary | ICD-10-CM | POA: Diagnosis not present

## 2024-03-30 DIAGNOSIS — Z8701 Personal history of pneumonia (recurrent): Secondary | ICD-10-CM

## 2024-03-30 DIAGNOSIS — I110 Hypertensive heart disease with heart failure: Secondary | ICD-10-CM | POA: Diagnosis present

## 2024-03-30 DIAGNOSIS — Z7951 Long term (current) use of inhaled steroids: Secondary | ICD-10-CM

## 2024-03-30 DIAGNOSIS — Z85118 Personal history of other malignant neoplasm of bronchus and lung: Secondary | ICD-10-CM

## 2024-03-30 DIAGNOSIS — Z7901 Long term (current) use of anticoagulants: Secondary | ICD-10-CM

## 2024-03-30 DIAGNOSIS — Z860100 Personal history of colon polyps, unspecified: Secondary | ICD-10-CM

## 2024-03-30 HISTORY — DX: Sepsis, unspecified organism: A41.9

## 2024-03-30 LAB — COMPREHENSIVE METABOLIC PANEL WITH GFR
ALT: 20 U/L (ref 0–44)
AST: 24 U/L (ref 15–41)
Albumin: 4.1 g/dL (ref 3.5–5.0)
Alkaline Phosphatase: 116 U/L (ref 38–126)
Anion gap: 9 (ref 5–15)
BUN: 43 mg/dL — ABNORMAL HIGH (ref 8–23)
CO2: 24 mmol/L (ref 22–32)
Calcium: 9.3 mg/dL (ref 8.9–10.3)
Chloride: 106 mmol/L (ref 98–111)
Creatinine, Ser: 1.27 mg/dL — ABNORMAL HIGH (ref 0.61–1.24)
GFR, Estimated: 60 mL/min — ABNORMAL LOW (ref >60)
Glucose, Bld: 99 mg/dL (ref 70–99)
Potassium: 5.5 mmol/L — ABNORMAL HIGH (ref 3.5–5.1)
Sodium: 139 mmol/L (ref 135–145)
Total Bilirubin: 0.9 mg/dL (ref 0.0–1.2)
Total Protein: 7 g/dL (ref 6.5–8.1)

## 2024-03-30 LAB — CBC WITH DIFFERENTIAL/PLATELET
Abs Immature Granulocytes: 0.06 K/uL (ref 0.00–0.07)
Basophils Absolute: 0 K/uL (ref 0.0–0.1)
Basophils Relative: 0 %
Eosinophils Absolute: 0 K/uL (ref 0.0–0.5)
Eosinophils Relative: 0 %
HCT: 35 % — ABNORMAL LOW (ref 39.0–52.0)
Hemoglobin: 10.8 g/dL — ABNORMAL LOW (ref 13.0–17.0)
Immature Granulocytes: 1 %
Lymphocytes Relative: 4 %
Lymphs Abs: 0.4 K/uL — ABNORMAL LOW (ref 0.7–4.0)
MCH: 28.3 pg (ref 26.0–34.0)
MCHC: 30.9 g/dL (ref 30.0–36.0)
MCV: 91.9 fL (ref 80.0–100.0)
Monocytes Absolute: 0.6 K/uL (ref 0.1–1.0)
Monocytes Relative: 5 %
Neutro Abs: 10.6 K/uL — ABNORMAL HIGH (ref 1.7–7.7)
Neutrophils Relative %: 90 %
Platelets: 154 K/uL (ref 150–400)
RBC: 3.81 MIL/uL — ABNORMAL LOW (ref 4.22–5.81)
RDW: 14.7 % (ref 11.5–15.5)
WBC: 11.7 K/uL — ABNORMAL HIGH (ref 4.0–10.5)
nRBC: 0 % (ref 0.0–0.2)

## 2024-03-30 LAB — BRAIN NATRIURETIC PEPTIDE: B Natriuretic Peptide: 396.8 pg/mL — ABNORMAL HIGH (ref 0.0–100.0)

## 2024-03-30 MED ORDER — SODIUM CHLORIDE 0.9 % IV SOLN
1.0000 g | Freq: Once | INTRAVENOUS | Status: AC
  Administered 2024-03-30: 1 g via INTRAVENOUS
  Filled 2024-03-30: qty 10

## 2024-03-30 MED ORDER — SODIUM CHLORIDE 0.9 % IV SOLN
500.0000 mg | Freq: Once | INTRAVENOUS | Status: AC
  Administered 2024-03-30: 500 mg via INTRAVENOUS
  Filled 2024-03-30: qty 5

## 2024-03-30 MED ORDER — ALBUTEROL SULFATE (2.5 MG/3ML) 0.083% IN NEBU
10.0000 mg/h | INHALATION_SOLUTION | Freq: Once | RESPIRATORY_TRACT | Status: AC
  Administered 2024-03-30: 10 mg/h via RESPIRATORY_TRACT
  Filled 2024-03-30: qty 3

## 2024-03-30 MED ORDER — METHYLPREDNISOLONE SODIUM SUCC 125 MG IJ SOLR
125.0000 mg | Freq: Once | INTRAMUSCULAR | Status: AC
  Administered 2024-03-30: 125 mg via INTRAVENOUS
  Filled 2024-03-30: qty 2

## 2024-03-30 NOTE — ED Notes (Signed)
 Pt weaned from 4L [place] to baseline O2 2L [place].

## 2024-03-30 NOTE — ED Provider Notes (Signed)
 [facility] AT [facility] Provider Note   CSN: [id number] Arrival date & time: 03/30/24  2105     Patient presents with: Shortness of Breath   [name] Code is [name] 73 y.o. male.   HPI Patient with multiple medical problems including [name]-fib, heart failure, lung cancer now presents with shortness of breath.  Patient is on Eliquis , compliant with his medication.  Today, he began having dyspnea, mild wheezing.  Wheezing improved somewhat with breathing treatment but patient was hypoxic at home, 84% on room air. No chest pain, no syncope, no fever.    Prior to Admission medications   Medication Sig Start Date End Date Taking? Authorizing Provider  albuterol  (PROVENTIL ) (2.5 MG/3ML) 0.083% nebulizer solution Take 2.5 mg by nebulization every 6 (six) hours as needed. 03/12/24   [provider]  allopurinol  (ZYLOPRIM ) 300 MG tablet Take 300 mg by mouth daily. 01/15/17   [provider]  amLODipine  (NORVASC ) 5 MG tablet Take 5 mg by mouth daily. 01/21/20   [provider]  apixaban  (ELIQUIS ) 5 MG TABS tablet Take 1 tablet by mouth twice daily 01/24/24   Revankar, Rajan R, MD  Buprenorphine HCl-Naloxone HCl 8-2 MG FILM Place 0.5-1 Film under the tongue 2 (two) times daily as needed (back pain). 03/24/23   [provider]  ciclopirox  (PENLAC ) 8 % solution Apply topically at bedtime. Apply [name] thin coat over nail. Apply daily over previous coat. Remove weekly with nail polish remover. 12/23/23   McCaughan, Dia D, DPM  ezetimibe  (ZETIA ) 10 MG tablet Take 10 mg by mouth daily. 02/09/23   [provider]  finasteride  (PROSCAR ) 5 MG tablet Take 5 mg by mouth daily. 03/13/19   [provider]  furosemide  (LASIX ) 20 MG tablet Take 1 tablet by mouth once daily 03/13/24   Anner Alm ORN, MD  ipratropium-albuterol  (DUONEB) 0.5-2.5 (3) MG/3ML SOLN Take 3 mLs by nebulization every 6 (six) hours as needed. 05/10/23   Drusilla Sabas RAMAN, MD   levothyroxine  (SYNTHROID ) 175 MCG tablet Take 175 mcg by mouth daily. 10/29/22   [provider]  LINZESS  290 MCG CAPS capsule Take 1 capsule (290 mcg total) by mouth daily before breakfast. 02/21/24   [name], Jacob T, PA-C  lisinopril  (PRINIVIL ,ZESTRIL ) 10 MG tablet Take 1 tablet (10 mg total) by mouth daily. 12/29/11   Nahser, Aleene PARAS, MD  Magnesium  Chloride 64 MG TABS Take 64 mg by mouth daily in the afternoon. 05/19/23   Carlin Delon BROCKS, NP  metoprolol  succinate (TOPROL  XL) 25 MG 24 hr tablet Take 1 tablet (25 mg total) by mouth daily. 02/08/23   Revankar, Jennifer SAUNDERS, MD  omeprazole  (PRILOSEC) 20 MG capsule Take 1 capsule (20 mg total) by mouth 2 (two) times daily. 10/13/19   Shelah Lamar RAMAN, MD  ondansetron  (ZOFRAN -ODT) 4 MG disintegrating tablet Take 1 tablet (4 mg total) by mouth every 8 (eight) hours as needed. 05/17/23   Billy Asberry FALCON, PA-C  PROAIR  HFA 108 (90 Base) MCG/ACT inhaler Inhale 2 puffs into the lungs every 6 (six) hours as needed for wheezing or shortness of breath. 10/02/17   Jerri Keys, MD  TRELEGY ELLIPTA 100-62.5-25 MCG/INH AEPB Inhale 1 puff into the lungs daily. 05/08/19   [provider]  zolpidem  (AMBIEN ) 10 MG tablet Take 10 mg by mouth at bedtime as needed for sleep. 03/24/23   [provider]    Allergies: Lipitor [atorvastatin ] and Omnipaque  [iohexol ]    Review of Systems  Updated Vital Signs BP 115/64   Pulse (!) 104   Temp 99.4 F (37.4 C)   Resp (!) 22   SpO2 94%   Physical Exam Vitals and nursing note reviewed.  Constitutional:      General: He is not in acute distress.    Appearance: He is well-developed.  HENT:     Head: Normocephalic and atraumatic.  Eyes:     Conjunctiva/sclera: Conjunctivae normal.  Cardiovascular:     Rate and Rhythm: Normal rate and regular rhythm.  Pulmonary:     Effort: Tachypnea present.     Breath sounds: Decreased breath sounds present.  Abdominal:     General: There is no distension.  Skin:     General: Skin is warm and dry.  Neurological:     Mental Status: He is alert and oriented to person, place, and time.     (all labs ordered are listed, but only abnormal results are displayed) Labs Reviewed  COMPREHENSIVE METABOLIC PANEL WITH GFR - Abnormal; Notable for the following components:      Result Value   Potassium 5.5 (*)    BUN 43 (*)    Creatinine, Ser 1.27 (*)    GFR, Estimated 60 (*)    All other components within normal limits  BRAIN NATRIURETIC PEPTIDE - Abnormal; Notable for the following components:   B Natriuretic Peptide 396.8 (*)    All other components within normal limits  CBC WITH DIFFERENTIAL/PLATELET - Abnormal; Notable for the following components:   WBC 11.7 (*)    RBC 3.81 (*)    Hemoglobin 10.8 (*)    HCT 35.0 (*)    Neutro Abs 10.6 (*)    Lymphs Abs 0.4 (*)    All other components within normal limits    EKG: EKG Interpretation Date/Time:  Thursday March 30 2024 21:21:34 EDT Ventricular Rate:  90 PR Interval:    QRS Duration:  97 QT Interval:  343 QTC Calculation: 420 R Axis:   110  Text Interpretation: Atrial fibrillation Right axis deviation Consider anterior infarct Confirmed by Garrick Charleston [phone]) on 03/30/2024 9:58:29 PM  Radiology: CT Chest Wo Contrast Result Date: 03/30/2024 CLINICAL DATA:  COPD lung cancer low oxygen  saturation EXAM: CT CHEST WITHOUT CONTRAST TECHNIQUE: Multidetector CT imaging of the chest was performed following the standard protocol without IV contrast. RADIATION DOSE REDUCTION: This exam was performed according to the departmental dose-optimization program which includes automated exposure control, adjustment of the mA and/or kV according to patient size and/or use of iterative reconstruction technique. COMPARISON:  CT 02/15/2024, 08/20/2023, 01/23/2023, PET CT 02/12/2023 FINDINGS: Cardiovascular: Limited evaluation without intravenous contrast. Moderate aortic atherosclerosis. No aneurysm. Dilated pulmonary  trunk up to 4.8 cm and enlarged central pulmonary vessels consistent with arterial hypertension. Coronary vascular calcification. Right-sided central venous catheter with tip in the SVC. Trace pericardial effusion Mediastinum/Nodes: Tracheal deviation to the right. Multiple borderline mediastinal lymph nodes. High prevascular lymph nodes measuring up to 10 mm. Left prevascular node measuring 9 mm. Left low paratracheal node measures 12 mm, previously 8 mm. Esophagus within normal limits. Lungs/Pleura: Redemonstrated sharply marginated area of consolidation in the right upper lobe surrounding fiducial markers and associated with volume loss presumably due to post radiation change. Wedge resection changes in the medial left upper lobe. Interim development of heterogeneous airspace disease in the right middle and left greater than right lower lobes, suspicious for multifocal pneumonia. Trace left-sided pleural effusion. Upper Abdomen: No acute finding. Incompletely visualized complex splenic lesion stable  small right adrenal adenoma. Musculoskeletal: No acute or suspicious osseous abnormality. IMPRESSION: 1. Interim development of heterogeneous airspace disease in the right middle and left greater than right lower lobes, suspicious for multifocal pneumonia. Trace left-sided pleural effusion. 2. Slight increase in mediastinal nodes, possibly reactive but attention on follow-up imaging. 3. Stable post radiation changes in the right upper lobe. Wedge resection changes in the medial left upper lobe. 4. Dilated pulmonary trunk and enlarged central pulmonary vessels consistent with arterial hypertension. Aortic Atherosclerosis (ICD10-I70.0). Electronically Signed   By: Luke Bun M.D.   On: 03/30/2024 23:34     Procedures   Medications Ordered in the ED  azithromycin  (ZITHROMAX ) 500 mg in sodium chloride  0.9 % 250 mL IVPB (has no administration in time range)  cefTRIAXone  (ROCEPHIN ) 1 g in sodium chloride  0.9 %  100 mL IVPB (has no administration in time range)  albuterol  (PROVENTIL ) (2.5 MG/3ML) 0.083% nebulizer solution (10 mg/hr Nebulization Given 03/30/24 2220)  methylPREDNISolone  sodium succinate  (SOLU-MEDROL ) 125 mg/2 mL injection 125 mg (125 mg Intravenous Given 03/30/24 2221)                                    Medical Decision Making Patient with history of prior respiratory complications including COPD exacerbations, heart failure, [name]-fib, now presents with shortness of breath, hypoxia at home.  Patient meets sepsis criteria with tachypnea, tachycardia, hypoxia, concern for pneumonia though progression of malignancy versus PE are considerations.  Patient has appropriately anticoagulated thus PE is less likely. Patient had CT, labs.  Pulse ox 94% on 2 L nasal cannula abnormal Cardiac 90/100 [name]-fib abnormal  Amount and/or Complexity of Data Reviewed Independent Historian: spouse External Data Reviewed: notes. Labs: ordered. Decision-making details documented in ED Course. Radiology: ordered and independent interpretation performed. Decision-making details documented in ED Course. ECG/medicine tests: ordered and independent interpretation performed. Decision-making details documented in ED Course.  Risk Prescription drug management. Decision regarding hospitalization. Diagnosis or treatment significantly limited by social determinants of health.   11:42 PM CT with concern for multifocal pneumonia given the patient's tachycardia, tachypnea, hypoxia, he will receive antibiotics, will require admission.  Patient is not high tensive, nor in distress, no evidence for septic shock.     Final diagnoses:  Sepsis with acute hypoxic respiratory failure without septic shock, due to unspecified organism [facility])    CRITICAL CARE Performed by: Lamar Salen Total critical care time: 35 minutes Critical care time was exclusive of separately billable procedures and treating other patients. Critical  care was necessary to treat or prevent imminent or life-threatening deterioration. Critical care was time spent personally by me on the following activities: development of treatment plan with patient and/or surrogate as well as nursing, discussions with consultants, evaluation of patient's response to treatment, examination of patient, obtaining history from patient or surrogate, ordering and performing treatments and interventions, ordering and review of laboratory studies, ordering and review of radiographic studies, pulse oximetry and re-evaluation of patient's condition.    Salen Lamar, MD 03/30/24 2342

## 2024-03-30 NOTE — H&P (Signed)
 History and Physical  [name] FMW:[id number] DOB: 1950/12/09 DOA: 03/30/2024  PCP: Jose Lang DASEN, PA-C   Chief Complaint: Shortness of breath and cough  HPI: [name] is a 73 y.o. male with medical history significant for COPD, chronic hypoxic respiratory failure on 2 L [employer], chronic diastolic HF, NSCLC of RUL s/p SBRT, non-Hodgkin's lymphoma, BPH, hypothyroidism, A-fib on Eliquis , HTN, HLD, GERD and gout who presented to the ED for evaluation of shortness of breath.  Patient reports he was working all day cleaning his garage and later in the afternoon, he started having shortness of breath. He administered some breathing treatments but his O2 remains in the 80s so he presented to the ED. He also reports a congested cough with blood-tinged sputum, wheezing, subjective fevers and chills but no chest pain, headache, dizziness, nausea, vomiting or abdominal pain.  ED Course: Initial vitals show temp 99.4, RR 17-25, HR 101, BP 151/69, SpO2 94% on 4 L, wean down to 2 L. Initial labs significant for K+ 5.5, BUN/creatinine 43/1.27, BNP 397, WBC 11.7, Hgb 10.8,. EKG shows A-fib with a rate of 90.  CT chest shows multifocal pneumonia.  Pt received IV Solu-Medrol , DuoNeb, IV azithromycin  and Rocephin . [facility] was consulted for admission.   Review of Systems: Please see HPI for pertinent positives and negatives. A complete 10 system review of systems are otherwise negative.  Past Medical History:  Diagnosis Date   Abnormal finding on GI tract imaging 09/07/2023   Acute diastolic CHF (congestive heart failure) (HCC) 05/07/2023   Acute on chronic diastolic CHF (congestive heart failure) (HCC) 05/06/2023   Acute on chronic respiratory failure with hypoxia (HCC) 05/06/2023   Aortic atherosclerosis (HCC) 01/26/2023   Arthritis    Atrial fibrillation (HCC) 10/26/2011   Benign neoplasm of colon 09/07/2023   BPH (benign prostatic hyperplasia) 2011   BRONCHOPNEUMONIA ORGANISM UNSPECIFIED 06/12/2010          Replacing diagnoses that were inactivated after the 12/21/22 regulatory import   Carpal tunnel syndrome, right upper limb 11/03/2022   Chronic respiratory failure with hypoxia (HCC) 08/27/2015   Congestive heart failure (HCC)    COPD (chronic obstructive pulmonary disease) (HCC)    COPD with acute exacerbation (HCC) 05/26/2023   Coronary artery calcification 01/26/2023   Cubital tunnel syndrome on right 07/09/2023   Depression    DOE (dyspnea on exertion) 04/07/2023   Essential hypertension 06/05/2008   Qualifier: Diagnosis of   By: [name]), [name]       GERD (gastroesophageal reflux disease)    Hypothyroidism 03/30/2012   Lesion of ulnar nerve, right upper limb 11/03/2022   Lymphadenopathy syndrome 09/12/2015   Mediastinal adenopathy 09/12/2015   NHL (non-Hodgkin's lymphoma) (HCC) 09/25/2015   Numbness of right hand 06/28/2022   Obesity    Open wound of right hand without foreign body 07/27/2021   Other dysphagia 11/11/2010   Qualifier: Diagnosis of   By: [name], [name]       Polycythemia 01/26/2023   Port catheter in place 08/20/2016   Pulmonary hypertension (HCC)    Pure hypercholesterolemia 12/03/2011   Recurrent pneumonia 02/10/2012   Routine general medical examination at a health care facility 12/03/2011   Squamous cell carcinoma lung (HCC) 09/26/2010   S/p LUL resection Dr Brantley dec 2011      Status post bronchoscopy with biopsy    Stiffness of finger joint of right hand 09/24/2021   Testicular swelling 09/12/2015   Past Surgical History:  Procedure Laterality Date  APPENDECTOMY     BACK SURGERY     CARDIOVERSION  11/27/2011   Procedure: CARDIOVERSION;  Surgeon: Aleene JINNY Passe, MD;  Location: [facility] ENDOSCOPY;  Service: Cardiovascular;  Laterality: N/A;   COLONOSCOPY     COLONOSCOPY WITH PROPOFOL  N/A 09/07/2023   Procedure: COLONOSCOPY WITH PROPOFOL ;  Surgeon: Leigh Elspeth SQUIBB, MD;  Location: [facility] ENDOSCOPY;  Service: Gastroenterology;   Laterality: N/A;   FUDUCIAL PLACEMENT Right 10/13/2019   Procedure: Placement Of Fuducial;  Surgeon: Shelah Lamar RAMAN, MD;  Location: [facility] OR;  Service: Thoracic;  Laterality: Right;  Right Upper Lobe    LEFT HEART CATH AND CORONARY ANGIOGRAPHY N/A 04/12/2023   Procedure: LEFT HEART CATH AND CORONARY ANGIOGRAPHY;  Surgeon: Anner Alm ORN, MD;  Location: [facility] INVASIVE CV LAB;  Service: Cardiovascular;  Laterality: N/A;   LUNG CANCER SURGERY  2011   POLYPECTOMY  09/07/2023   Procedure: POLYPECTOMY;  Surgeon: Leigh Elspeth SQUIBB, MD;  Location: [facility] ENDOSCOPY;  Service: Gastroenterology;;   UPPER GI ENDOSCOPY     VIDEO BRONCHOSCOPY WITH ENDOBRONCHIAL NAVIGATION N/A 10/13/2019   Procedure: VIDEO BRONCHOSCOPY WITH ENDOBRONCHIAL NAVIGATION;  Surgeon: Shelah Lamar RAMAN, MD;  Location: [facility] OR;  Service: Thoracic;  Laterality: N/A;   VIDEO BRONCHOSCOPY WITH ENDOBRONCHIAL ULTRASOUND N/A 10/13/2019   Procedure: VIDEO BRONCHOSCOPY WITH ENDOBRONCHIAL ULTRASOUND;  Surgeon: Shelah Lamar RAMAN, MD;  Location: [facility] OR;  Service: Thoracic;  Laterality: N/A;   Social History:  reports that he quit smoking about 22 years ago. His smoking use included cigarettes. He started smoking about 65 years ago. He has a 129 pack-year smoking history. He has been exposed to tobacco smoke. He has never used smokeless tobacco. He reports that he does not drink alcohol  and does not use drugs.  Allergies  Allergen Reactions   Lipitor [Atorvastatin ] Other (See Comments)    Myalgias  Weakness  Fatigue    Omnipaque  [Iohexol ] Itching and Other (See Comments)    When asked about contrast media today, pt stated that last time he had the contrast that he was red all over and itchy from head to toe.     Family History  Problem Relation Age of Onset   Heart disease Father    Cancer Neg Hx    Diabetes Neg Hx    Stroke Neg Hx    Hypertension Neg Hx    Kidney disease Neg Hx      Prior to Admission medications   Medication Sig Start Date End  Date Taking? Authorizing Provider  albuterol  (PROVENTIL ) (2.5 MG/3ML) 0.083% nebulizer solution Take 2.5 mg by nebulization every 6 (six) hours as needed. 03/12/24   [provider]  allopurinol  (ZYLOPRIM ) 300 MG tablet Take 300 mg by mouth daily. 01/15/17   [provider]  amLODipine  (NORVASC ) 5 MG tablet Take 5 mg by mouth daily. 01/21/20   [provider]  apixaban  (ELIQUIS ) 5 MG TABS tablet Take 1 tablet by mouth twice daily 01/24/24   Revankar, Rajan R, MD  Buprenorphine HCl-Naloxone HCl 8-2 MG FILM Place 0.5-1 Film under the tongue 2 (two) times daily as needed (back pain). 03/24/23   [provider]  ciclopirox  (PENLAC ) 8 % solution Apply topically at bedtime. Apply a thin coat over nail. Apply daily over previous coat. Remove weekly with nail polish remover. 12/23/23   McCaughan, Dia D, DPM  ezetimibe  (ZETIA ) 10 MG tablet Take 10 mg by mouth daily. 02/09/23   [provider]  finasteride  (PROSCAR ) 5 MG tablet Take 5 mg by  mouth daily. 03/13/19   [provider]  furosemide  (LASIX ) 20 MG tablet Take 1 tablet by mouth once daily 03/13/24   Anner Alm ORN, MD  ipratropium-albuterol  (DUONEB) 0.5-2.5 (3) MG/3ML SOLN Take 3 mLs by nebulization every 6 (six) hours as needed. 05/10/23   Drusilla Sabas RAMAN, MD  levothyroxine  (SYNTHROID ) 175 MCG tablet Take 175 mcg by mouth daily. 10/29/22   [provider]  LINZESS  290 MCG CAPS capsule Take 1 capsule (290 mcg total) by mouth daily before breakfast. 02/21/24   [name], Jacob T, PA-C  lisinopril  (PRINIVIL ,ZESTRIL ) 10 MG tablet Take 1 tablet (10 mg total) by mouth daily. 12/29/11   Nahser, Aleene PARAS, MD  Magnesium  Chloride 64 MG TABS Take 64 mg by mouth daily in the afternoon. 05/19/23   Carlin Delon BROCKS, NP  metoprolol  succinate (TOPROL  XL) 25 MG 24 hr tablet Take 1 tablet (25 mg total) by mouth daily. 02/08/23   Revankar, Jennifer SAUNDERS, MD  omeprazole  (PRILOSEC) 20 MG capsule Take 1 capsule (20 mg total) by mouth 2  (two) times daily. 10/13/19   Shelah Lamar RAMAN, MD  ondansetron  (ZOFRAN -ODT) 4 MG disintegrating tablet Take 1 tablet (4 mg total) by mouth every 8 (eight) hours as needed. 05/17/23   Billy Asberry FALCON, PA-C  PROAIR  HFA 108 (90 Base) MCG/ACT inhaler Inhale 2 puffs into the lungs every 6 (six) hours as needed for wheezing or shortness of breath. 10/02/17   Jerri Keys, MD  TRELEGY ELLIPTA 100-62.5-25 MCG/INH AEPB Inhale 1 puff into the lungs daily. 05/08/19   [provider]  zolpidem  (AMBIEN ) 10 MG tablet Take 10 mg by mouth at bedtime as needed for sleep. 03/24/23   [provider]    Physical Exam: BP 115/64   Pulse (!) 104   Temp 99.4 F (37.4 C)   Resp (!) 22   SpO2 94%  General: Pleasant, well-appearing elderly man laying in bed. No acute distress. HEENT: [place]/AT. Anicteric sclera CV: Tachycardic. Irregularly irregular rhythm. No murmurs, rubs, or gallops. No LE edema Pulmonary: On 2 L [place]. Mild tachypnea. Lungs CTAB. Normal effort. Rhonchi. No wheezing or rales. Decreased breath sounds at the bases. Abdominal: Soft, nontender, nondistended. Normal bowel sounds. Extremities: Palpable radial and DP pulses. Normal ROM. Skin: Warm and dry. Telangiectasia on chest wall and lower extremities, varicose veins in both lower extremities. Neuro: A&Ox3. Moves all extremities. Normal sensation to light touch. No focal deficit. Psych: Normal mood and affect          Labs on Admission:  Basic Metabolic Panel: Recent Labs  Lab 03/30/24 2157  NA 139  K 5.5*  CL 106  CO2 24  GLUCOSE 99  BUN 43*  CREATININE 1.27*  CALCIUM  9.3   Liver Function Tests: Recent Labs  Lab 03/30/24 2157  AST 24  ALT 20  ALKPHOS 116  BILITOT 0.9  PROT 7.0  ALBUMIN 4.1   No results for input(s): LIPASE, AMYLASE in the last 168 hours. No results for input(s): AMMONIA in the last 168 hours. CBC: Recent Labs  Lab 03/30/24 2157  WBC 11.7*  NEUTROABS 10.6*  HGB 10.8*  HCT 35.0*  MCV 91.9   PLT 154   Cardiac Enzymes: No results for input(s): CKTOTAL, CKMB, CKMBINDEX, TROPONINI in the last 168 hours. BNP (last 3 results) Recent Labs    05/06/23 1603 05/26/23 2121 03/30/24 2157  BNP 440.2* 319.7* 396.8*    ProBNP (last 3 results) Recent Labs    05/18/23 1548 05/25/23 0800  PROBNP 1,876* 2,365*    CBG: No results for input(s): GLUCAP in the last 168 hours.  Radiological Exams on Admission: CT Chest Wo Contrast Result Date: 03/30/2024 CLINICAL DATA:  COPD lung cancer low oxygen  saturation EXAM: CT CHEST WITHOUT CONTRAST TECHNIQUE: Multidetector CT imaging of the chest was performed following the standard protocol without IV contrast. RADIATION DOSE REDUCTION: This exam was performed according to the departmental dose-optimization program which includes automated exposure control, adjustment of the mA and/or kV according to patient size and/or use of iterative reconstruction technique. COMPARISON:  CT 02/15/2024, 08/20/2023, 01/23/2023, PET CT 02/12/2023 FINDINGS: Cardiovascular: Limited evaluation without intravenous contrast. Moderate aortic atherosclerosis. No aneurysm. Dilated pulmonary trunk up to 4.8 cm and enlarged central pulmonary vessels consistent with arterial hypertension. Coronary vascular calcification. Right-sided central venous catheter with tip in the SVC. Trace pericardial effusion Mediastinum/Nodes: Tracheal deviation to the right. Multiple borderline mediastinal lymph nodes. High prevascular lymph nodes measuring up to 10 mm. Left prevascular node measuring 9 mm. Left low paratracheal node measures 12 mm, previously 8 mm. Esophagus within normal limits. Lungs/Pleura: Redemonstrated sharply marginated area of consolidation in the right upper lobe surrounding fiducial markers and associated with volume loss presumably due to post radiation change. Wedge resection changes in the medial left upper lobe. Interim development of heterogeneous airspace  disease in the right middle and left greater than right lower lobes, suspicious for multifocal pneumonia. Trace left-sided pleural effusion. Upper Abdomen: No acute finding. Incompletely visualized complex splenic lesion stable small right adrenal adenoma. Musculoskeletal: No acute or suspicious osseous abnormality. IMPRESSION: 1. Interim development of heterogeneous airspace disease in the right middle and left greater than right lower lobes, suspicious for multifocal pneumonia. Trace left-sided pleural effusion. 2. Slight increase in mediastinal nodes, possibly reactive but attention on follow-up imaging. 3. Stable post radiation changes in the right upper lobe. Wedge resection changes in the medial left upper lobe. 4. Dilated pulmonary trunk and enlarged central pulmonary vessels consistent with arterial hypertension. Aortic Atherosclerosis (ICD10-I70.0). Electronically Signed   By: Luke Bun M.D.   On: 03/30/2024 23:34   Assessment/Plan [name] is a 73 y.o. male with medical history significant for COPD, chronic hypoxic respiratory failure on 2 [place], chronic diastolic HF, NSCLC of RUL s/p SBRT, non-Hodgkin's lymphoma, BPH, hypothyroidism, A-fib on Eliquis , HTN, HLD, GERD and gout who presented to the ED for evaluation of shortness of breath.    # Sepsis # Multifocal pneumonia - Pt presented with 1 day of shortness of breath, cough and subjective fevers and chills - Chest imaging shows multifocal pneumonia - Met sepsis criteria with tachycardia, mild tachypnea, leukocytosis and evidence of respiratory infection - Continue IV Rocephin  and oral azithromycin  - Mucinex  DM twice daily - Follow-up blood culture and sputum culture - Check MRSA screen, full RVP, procalcitonin, urinary Legionella and strep pneumo - Trend CBC, fever curve - Incentive spirometer, flutter valve  # COPD exacerbation # Chronic hypoxic respiratory failure - Presented with shortness of breath, cough and wheezing -  Pt with rhonchi but no wheezing on lung auscultation, remains on baseline 2 L [place] - S/p IV solumedrol, DuoNebs in the ED - Start prednisone  40 mg daily - Continue azithromycin  - Continue home bronchodilator - As needed duonebs - Continue supplemental O2  # Hyperkalemia - Patient found to have K+ of 5.5 on admission - Consider Lokelma  if no improvement with home Lasix  - Follow-up morning BMP  # A-fib - EKG shows A-fib with rate of 90 on admission -  Continue Toprol  XL and Eliquis  - Telemetry  # Chronic diastolic HF # HTN - BNP elevated to 396, CT chest with no evidence of pulmonary edema - BP initially elevated but remained stable with SBP in the 110s, patient euvolemic on exam - Continue lisinopril , Toprol  XL and Lasix   # Hypothyroidism - Continue Synthroid   # HLD - Continue Zetia   # Gout - Continue allopurinol   # BPH - Continue finasteride   # GERD -Continue Protonix   DVT prophylaxis: Eliquis     Code Status: Full Code  Consults called: None  Family Communication: No family at bedside  Severity of Illness: The appropriate patient status for this patient is INPATIENT. Inpatient status is judged to be reasonable and necessary in order to provide the required intensity of service to ensure the patient's safety. The patient's presenting symptoms, physical exam findings, and initial radiographic and laboratory data in the context of their chronic comorbidities is felt to place them at high risk for further clinical deterioration. Furthermore, it is not anticipated that the patient will be medically stable for discharge from the hospital within 2 midnights of admission.   * I certify that at the point of admission it is my clinical judgment that the patient will require inpatient hospital care spanning beyond 2 midnights from the point of admission due to high intensity of service, high risk for further deterioration and high frequency of surveillance required.*  Level of  care: Telemetry   This record has been created using Conservation officer, historic buildings. Errors have been sought and corrected, but may not always be located. Such creation errors do not reflect on the standard of care.   Lou Claretta HERO, MD 03/30/2024, 11:56 PM [facility] Pager: [phone] Isaiah 41:10   If 7PM-7AM, please contact night-coverage [email or web address] Password [email or web address]

## 2024-03-30 NOTE — ED Triage Notes (Signed)
 Pt presents with sudden onset shob this evening.  Pt has hx of COPD and lung CA but is not currently undergoing treatment.  Pt reports he wears O2 at home only at night and as needed, sometimes he does not need it.  Reports resting home O2 during the day is normally 95% on RA.  Pt noted sats this evening at 85% and came in for eval.  Pt had O2 sats in triage of 84% on 2L [place].  Pt increased to 4L [place] and sats improved to 92%

## 2024-03-31 DIAGNOSIS — J188 Other pneumonia, unspecified organism: Secondary | ICD-10-CM

## 2024-03-31 DIAGNOSIS — E875 Hyperkalemia: Secondary | ICD-10-CM

## 2024-03-31 DIAGNOSIS — A419 Sepsis, unspecified organism: Secondary | ICD-10-CM | POA: Diagnosis not present

## 2024-03-31 DIAGNOSIS — J189 Pneumonia, unspecified organism: Secondary | ICD-10-CM | POA: Diagnosis not present

## 2024-03-31 HISTORY — DX: Hyperkalemia: E87.5

## 2024-03-31 HISTORY — DX: Other pneumonia, unspecified organism: J18.8

## 2024-03-31 LAB — CBC
HCT: 32.7 % — ABNORMAL LOW (ref 39.0–52.0)
Hemoglobin: 10 g/dL — ABNORMAL LOW (ref 13.0–17.0)
MCH: 28.5 pg (ref 26.0–34.0)
MCHC: 30.6 g/dL (ref 30.0–36.0)
MCV: 93.2 fL (ref 80.0–100.0)
Platelets: 142 K/uL — ABNORMAL LOW (ref 150–400)
RBC: 3.51 MIL/uL — ABNORMAL LOW (ref 4.22–5.81)
RDW: 14.7 % (ref 11.5–15.5)
WBC: 14.1 K/uL — ABNORMAL HIGH (ref 4.0–10.5)
nRBC: 0 % (ref 0.0–0.2)

## 2024-03-31 LAB — BASIC METABOLIC PANEL WITH GFR
Anion gap: 8 (ref 5–15)
BUN: 39 mg/dL — ABNORMAL HIGH (ref 8–23)
CO2: 21 mmol/L — ABNORMAL LOW (ref 22–32)
Calcium: 8.9 mg/dL (ref 8.9–10.3)
Chloride: 109 mmol/L (ref 98–111)
Creatinine, Ser: 1.32 mg/dL — ABNORMAL HIGH (ref 0.61–1.24)
GFR, Estimated: 57 mL/min — ABNORMAL LOW (ref >60)
Glucose, Bld: 133 mg/dL — ABNORMAL HIGH (ref 70–99)
Potassium: 5.4 mmol/L — ABNORMAL HIGH (ref 3.5–5.1)
Sodium: 138 mmol/L (ref 135–145)

## 2024-03-31 LAB — RESPIRATORY PANEL BY PCR

## 2024-03-31 LAB — STREP PNEUMONIAE URINARY ANTIGEN: Strep Pneumo Urinary Antigen: NEGATIVE

## 2024-03-31 LAB — PROCALCITONIN: Procalcitonin: 0.26 ng/mL

## 2024-03-31 LAB — MRSA NEXT GEN BY PCR, NASAL: MRSA by PCR Next Gen: NOT DETECTED

## 2024-03-31 MED ORDER — ACETAMINOPHEN 650 MG RE SUPP: 650.0000 mg | Freq: Four times a day (QID) | RECTAL | Status: DC | PRN

## 2024-03-31 MED ORDER — PANTOPRAZOLE SODIUM 40 MG PO TBEC
40.0000 mg | DELAYED_RELEASE_TABLET | Freq: Every day | ORAL | Status: DC
  Administered 2024-03-31 – 2024-04-02 (×3): 40 mg via ORAL
  Filled 2024-03-31 (×3): qty 1

## 2024-03-31 MED ORDER — LINACLOTIDE 145 MCG PO CAPS
290.0000 ug | ORAL_CAPSULE | Freq: Every day | ORAL | Status: DC
  Administered 2024-03-31 – 2024-04-02 (×3): 290 ug via ORAL
  Filled 2024-03-31 (×3): qty 2

## 2024-03-31 MED ORDER — AMLODIPINE BESYLATE 5 MG PO TABS
5.0000 mg | ORAL_TABLET | Freq: Every day | ORAL | Status: DC
  Administered 2024-03-31 – 2024-04-01 (×2): 5 mg via ORAL
  Filled 2024-03-31 (×2): qty 1

## 2024-03-31 MED ORDER — ACETAMINOPHEN 325 MG PO TABS: 650.0000 mg | ORAL_TABLET | Freq: Four times a day (QID) | ORAL | Status: DC | PRN

## 2024-03-31 MED ORDER — EZETIMIBE 10 MG PO TABS
10.0000 mg | ORAL_TABLET | Freq: Every day | ORAL | Status: DC
  Administered 2024-03-31 – 2024-04-02 (×3): 10 mg via ORAL
  Filled 2024-03-31 (×3): qty 1

## 2024-03-31 MED ORDER — FUROSEMIDE 20 MG PO TABS
20.0000 mg | ORAL_TABLET | Freq: Every day | ORAL | Status: DC
  Administered 2024-03-31 – 2024-04-01 (×2): 20 mg via ORAL
  Filled 2024-03-31 (×2): qty 1

## 2024-03-31 MED ORDER — DM-GUAIFENESIN ER 30-600 MG PO TB12
1.0000 | ORAL_TABLET | Freq: Two times a day (BID) | ORAL | Status: DC
  Administered 2024-03-31 – 2024-04-02 (×6): 1 via ORAL
  Filled 2024-03-31 (×6): qty 1

## 2024-03-31 MED ORDER — SENNOSIDES-DOCUSATE SODIUM 8.6-50 MG PO TABS: 1.0000 | ORAL_TABLET | Freq: Every evening | ORAL | Status: DC | PRN

## 2024-03-31 MED ORDER — APIXABAN 5 MG PO TABS
5.0000 mg | ORAL_TABLET | Freq: Two times a day (BID) | ORAL | Status: DC
  Administered 2024-03-31 – 2024-04-02 (×6): 5 mg via ORAL
  Filled 2024-03-31 (×6): qty 1

## 2024-03-31 MED ORDER — IPRATROPIUM-ALBUTEROL 0.5-2.5 (3) MG/3ML IN SOLN
3.0000 mL | Freq: Three times a day (TID) | RESPIRATORY_TRACT | Status: DC
  Administered 2024-03-31 – 2024-04-02 (×8): 3 mL via RESPIRATORY_TRACT
  Filled 2024-03-31 (×7): qty 3

## 2024-03-31 MED ORDER — ONDANSETRON HCL 4 MG/2ML IJ SOLN: 4.0000 mg | Freq: Four times a day (QID) | INTRAMUSCULAR | Status: DC | PRN

## 2024-03-31 MED ORDER — LISINOPRIL 10 MG PO TABS: 10.0000 mg | ORAL_TABLET | Freq: Every day | ORAL | Status: DC

## 2024-03-31 MED ORDER — PREDNISONE 20 MG PO TABS
40.0000 mg | ORAL_TABLET | Freq: Every day | ORAL | Status: DC
  Administered 2024-03-31 – 2024-04-02 (×3): 40 mg via ORAL
  Filled 2024-03-31 (×3): qty 2

## 2024-03-31 MED ORDER — BUDESON-GLYCOPYRROL-FORMOTEROL 160-9-4.8 MCG/ACT IN AERO
2.0000 | INHALATION_SPRAY | Freq: Two times a day (BID) | RESPIRATORY_TRACT | Status: DC
  Administered 2024-03-31 – 2024-04-02 (×5): 2 via RESPIRATORY_TRACT
  Filled 2024-03-31: qty 5.9

## 2024-03-31 MED ORDER — SODIUM ZIRCONIUM CYCLOSILICATE 10 G PO PACK
10.0000 g | PACK | Freq: Once | ORAL | Status: AC
  Administered 2024-03-31: 10 g via ORAL
  Filled 2024-03-31: qty 1

## 2024-03-31 MED ORDER — ZOLPIDEM TARTRATE 5 MG PO TABS: 10.0000 mg | ORAL_TABLET | Freq: Every evening | ORAL | Status: DC | PRN

## 2024-03-31 MED ORDER — METOPROLOL SUCCINATE ER 25 MG PO TB24
25.0000 mg | ORAL_TABLET | Freq: Every day | ORAL | Status: DC
  Administered 2024-03-31 – 2024-04-02 (×3): 25 mg via ORAL
  Filled 2024-03-31 (×3): qty 1

## 2024-03-31 MED ORDER — LEVOTHYROXINE SODIUM 25 MCG PO TABS
175.0000 ug | ORAL_TABLET | Freq: Every day | ORAL | Status: DC
  Administered 2024-03-31 – 2024-04-02 (×3): 175 ug via ORAL
  Filled 2024-03-31 (×3): qty 1

## 2024-03-31 MED ORDER — ONDANSETRON HCL 4 MG PO TABS: 4.0000 mg | ORAL_TABLET | Freq: Four times a day (QID) | ORAL | Status: DC | PRN

## 2024-03-31 MED ORDER — IPRATROPIUM-ALBUTEROL 0.5-2.5 (3) MG/3ML IN SOLN
3.0000 mL | Freq: Four times a day (QID) | RESPIRATORY_TRACT | Status: DC | PRN
  Filled 2024-03-31: qty 3

## 2024-03-31 MED ORDER — SODIUM CHLORIDE 0.9 % IV SOLN
1.0000 g | INTRAVENOUS | Status: DC
  Administered 2024-03-31 – 2024-04-01 (×2): 1 g via INTRAVENOUS
  Filled 2024-03-31 (×2): qty 10

## 2024-03-31 MED ORDER — FINASTERIDE 5 MG PO TABS
5.0000 mg | ORAL_TABLET | Freq: Every day | ORAL | Status: DC
  Administered 2024-03-31 – 2024-04-02 (×3): 5 mg via ORAL
  Filled 2024-03-31 (×3): qty 1

## 2024-03-31 MED ORDER — AZITHROMYCIN 250 MG PO TABS
500.0000 mg | ORAL_TABLET | Freq: Every day | ORAL | Status: DC
  Administered 2024-03-31 – 2024-04-02 (×3): 500 mg via ORAL
  Filled 2024-03-31 (×3): qty 2

## 2024-03-31 MED ORDER — ALLOPURINOL 300 MG PO TABS
300.0000 mg | ORAL_TABLET | Freq: Every day | ORAL | Status: DC
  Administered 2024-03-31 – 2024-04-02 (×3): 300 mg via ORAL
  Filled 2024-03-31 (×3): qty 1

## 2024-03-31 NOTE — Assessment & Plan Note (Signed)
 Chronic diastolic CHF Pulmonary hypertension BP elevated, no evidence of fluid overload - Continue furosemide , metoprolol  - Hydralazine  as needed for severe range pressures - Hold lisinopril  - Resume amlodipine 

## 2024-03-31 NOTE — Plan of Care (Signed)
°  Problem: Clinical Measurements: Goal: Will remain free from infection Outcome: Progressing   Problem: Activity: Goal: Risk for activity intolerance will decrease Outcome: Progressing   Problem: Nutrition: Goal: Adequate nutrition will be maintained Outcome: Progressing

## 2024-03-31 NOTE — Hospital Course (Addendum)
 73 y.o. M  with COPD on 2L home O2, dCHF, NSCLC of RUL s/p SBRT, non-Hodgkin's lymphoma, hypothyroidism, A-fib on Eliquis , HTN, and HLD who presented with pneumonia sepsis.   Breathing doing well on RA. Walking in hallway O2 sat lowest 91.    [facility] [place]. [place] [address].    Coolonoscopy 08/2023,   Repeat 08/2024.    No wheezing no Rhonchi.   No LE edema.   Chronic venous stasis changes.

## 2024-03-31 NOTE — Plan of Care (Signed)
  Problem: Clinical Measurements: Goal: Respiratory complications will improve Outcome: Progressing   Problem: Nutrition: Goal: Adequate nutrition will be maintained Outcome: Progressing   Problem: Safety: Goal: Ability to remain free from injury will improve Outcome: Progressing   

## 2024-03-31 NOTE — Progress Notes (Addendum)
 SATURATION QUALIFICATIONS: (This note is used to comply with regulatory documentation for home oxygen )  Patient Saturations on Room Air at Rest = 95%  Patient Saturations on Room Air while Ambulating = 86%    Please briefly explain why patient needs home oxygen : Patient needs home ozxygen because he requires oxygen  to recover from desaturation during activity.

## 2024-03-31 NOTE — Assessment & Plan Note (Signed)
 Continue levothyroxine 

## 2024-03-31 NOTE — Assessment & Plan Note (Signed)
 Permanent. Rate controlled - Continue Eliquis  - Continue metoprolol 

## 2024-03-31 NOTE — Progress Notes (Signed)
  Progress Note   Patient: KOLBIE LEPKOWSKI FMW:[id number] DOB: 13-May-1951 DOA: 03/30/2024     1 DOS: the patient was seen and examined on 03/31/2024 at 937      Brief hospital course: 73 y.o. M  with obesity, COPD on 2L home O2, dCHF, NSCLC of RUL s/p SBRT, non-Hodgkin's lymphoma, hypothyroidism, A-fib on Eliquis , HTN, and HLD who presented with pneumonia sepsis.     Assessment and Plan: * Sepsis due to pneumonia [facility]) Presented with tachycardia, tachypnea and multifocal pneumonia.  Has sepsis without end organ damage.  Procalcitonin 0.26.  MRSA nares negative.  Respiratory virus panel negative. - Continue Rocephin  and azithromycin  - Pulmonary toilet - Wean O2 as able -Follow sputum culture   Hyperkalemia - Hold ACEi - Lokelma  x1  Obesity    NHL (non-Hodgkin's lymphoma) (HCC)    Hypothyroidism - Continue levothyroxine   Atrial fibrillation (HCC) Permanent. Rate controlled - Continue Eliquis  - Continue metoprolol   Squamous cell carcinoma lung (HCC)    COPD (chronic obstructive pulmonary disease) (HCC) With exacerbation - Continue prednisone  - Continue scheduled and PRN bronchodilators - Continue ICS/LABA/LAMA  Essential hypertension Chronic diastolic CHF Pulmonary hypertension BP elevated, no evidence of fluid overload - Continue furosemide , metoprolol  - Hydralazine  as needed for severe range pressures - Hold lisinopril  - Resume amlodipine           Subjective: Patient is doing okay, gradually improving, still on oxygen .  Ambulated with nursing, desaturated to 86%.  At baseline he is able to ambulate on room air and not desaturate.     Physical Exam: BP (!) 157/84 (BP Location: Right Arm)   Pulse 65   Temp 98 F (36.7 C)   Resp 20   Ht 6' (1.829 m)   SpO2 98%   BMI 29.00 kg/m   Adult male, sitting up in bed, interactive and appropriate RRR, no murmurs, no peripheral edema Respiratory rate normal, with very coarse rales bilaterally, no  wheezing Abdomen soft, no tenderness palpation or guarding, no ascites or distention Attention normal, affect normal, judgment and insight appear normal    Data Reviewed: Basic metabolic panel shows persistent hyperkalemia, creatinine slightly elevated 1.3 CBC shows leukocytosis to 14,000, hemoglobin 10     Family Communication: Wife at the bedside    Disposition: Status is: Inpatient Patient desaturated to 86% on room air with walking, which is worse than reported baseline        Author: Lonni SHAUNNA Dalton, MD 03/31/2024 1:37 PM  For on call review [email or web address].

## 2024-03-31 NOTE — Assessment & Plan Note (Addendum)
 With exacerbation - Continue prednisone  - Continue scheduled and PRN bronchodilators - Continue ICS/LABA/LAMA

## 2024-03-31 NOTE — Assessment & Plan Note (Addendum)
 Presented with tachycardia, tachypnea and multifocal pneumonia.  Has sepsis without end organ damage.  Procalcitonin 0.26.  MRSA nares negative.  Respiratory virus panel negative. - Continue Rocephin  and azithromycin  - Pulmonary toilet - Wean O2 as able -Follow sputum culture

## 2024-03-31 NOTE — TOC Initial Note (Signed)
 Transition of Care [facility]) - Initial/Assessment Note    Patient Details  Name: [name] MRN: [id number] Date of Birth: Nov 24, 1950  Transition of Care [facility]) CM/SW Contact:    Sonda Manuella Quill, RN Phone Number: 03/31/2024, 11:50 AM  Clinical Narrative:                 Beatris w/ pt in room; pt says he lives at home w/ his wife Ondra Deboard [phone]); she will provide transportation; pt verified insurance/PCP; he denied SDOH risks; pt says he does not have other DME, or HH services; pt says he has oxygen  from [employer] for night time use; TOC is following.  Expected Discharge Plan: Home/Self Care Barriers to Discharge: Continued Medical Work up   Patient Goals and CMS Choice Patient states their goals for this hospitalization and ongoing recovery are:: home CMS Medicare.gov Compare Post Acute Care list provided to:: Patient   [place] ownership interest in [facility].provided to:: Patient    Expected Discharge Plan and Services   Discharge Planning Services: CM Consult   Living arrangements for the past 2 months: Single Family Home                                      Prior Living Arrangements/Services Living arrangements for the past 2 months: Single Family Home Lives with:: Spouse Patient language and need for interpreter reviewed:: Yes Do you feel safe going back to the place where you live?: Yes      Need for Family Participation in Patient Care: Yes (Comment) Care giver support system in place?: Yes (comment) Current home services: DME (home oxygen  w/ [employer]) Criminal Activity/Legal Involvement Pertinent to Current Situation/Hospitalization: No - Comment as needed  Activities of Daily Living   ADL Screening (condition at time of admission) Independently performs ADLs?: Yes (appropriate for developmental age) Is the patient deaf or have difficulty hearing?: Yes Does the patient have difficulty seeing, even when wearing  glasses/contacts?: No Does the patient have difficulty concentrating, remembering, or making decisions?: No  Permission Sought/Granted Permission sought to share information with : Case Manager Permission granted to share information with : Yes, Verbal Permission Granted  Share Information with NAME: Case Manager     Permission granted to share info w Relationship: Gianmarco Roye (spouse) [phone]     Emotional Assessment Appearance:: Appears stated age Attitude/Demeanor/Rapport: Gracious Affect (typically observed): Accepting Orientation: : Oriented to Self, Oriented to Place, Oriented to  Time, Oriented to Situation Alcohol  / Substance Use: Not Applicable Psych Involvement: No (comment)  Admission diagnosis:  Sepsis due to pneumonia (HCC) [J18.9, A41.9] Sepsis with acute hypoxic respiratory failure without septic shock, due to unspecified organism (HCC) [A41.9, R65.20, J96.01] Patient Active Problem List   Diagnosis Date Noted   Hyperkalemia 03/31/2024   Multifocal pneumonia 03/31/2024   Sepsis due to pneumonia (HCC) 03/30/2024   Statin myopathy 02/21/2024   Chronic constipation 02/21/2024   Primary osteoarthritis of left distal radioulnar joint 02/21/2024   Primary osteoarthritis of right distal radioulnar joint 01/28/2024   Idiopathic chronic gout of multiple sites without tophus 01/24/2024   Cubital tunnel syndrome on right 07/09/2023   COPD with acute exacerbation (HCC) 05/26/2023   Acute diastolic CHF (congestive heart failure) (HCC) 05/07/2023   Acute on chronic diastolic CHF (congestive heart failure) (HCC) 05/06/2023   Acute on chronic respiratory failure with hypoxia (HCC) 05/06/2023   DOE (dyspnea on  exertion) 04/07/2023   Congestive heart failure (HCC) 01/26/2023   GERD (gastroesophageal reflux disease) 01/26/2023   Obesity 01/26/2023   Polycythemia 01/26/2023   Pulmonary hypertension (HCC) 01/26/2023   Aortic atherosclerosis (HCC) 01/26/2023   Coronary artery  calcification 01/26/2023   Carpal tunnel syndrome, right upper limb 11/03/2022   Lesion of ulnar nerve, right upper limb 11/03/2022   Numbness of right hand 06/28/2022   Status post bronchoscopy with biopsy    Port catheter in place 08/20/2016   NHL (non-Hodgkin's lymphoma) (HCC) 09/25/2015   Lymphadenopathy syndrome 09/12/2015   Mediastinal adenopathy 09/12/2015   Chronic respiratory failure with hypoxia (HCC) 08/27/2015   Hypothyroidism 03/30/2012   Depression 12/03/2011   Pure hypercholesterolemia 12/03/2011   Atrial fibrillation (HCC) 10/26/2011   Other dysphagia 11/11/2010   Squamous cell carcinoma lung (HCC) 09/26/2010   BPH (benign prostatic hyperplasia) 2011   ERECTILE DYSFUNCTION 09/24/2008   Essential hypertension 06/05/2008   COPD (chronic obstructive pulmonary disease) (HCC) 06/05/2008   PCP:  Iven Lang DASEN, PA-C Pharmacy:   [facility] [id number] [facility], [place] - [address] [place] [address] Phone: [phone] Fax: [phone]     Social Drivers of Health (SDOH) Social History: SDOH Screenings   Food Insecurity: No Food Insecurity (03/31/2024)  Housing: Low Risk  (03/31/2024)  Transportation Needs: No Transportation Needs (03/31/2024)  Utilities: Not At Risk (03/31/2024)  Depression (PHQ2-9): Low Risk  (01/24/2024)  Social Connections: Socially Integrated (03/31/2024)  Tobacco Use: Medium Risk (03/30/2024)   SDOH Interventions: Food Insecurity Interventions: Intervention Not Indicated, Inpatient TOC Housing Interventions: Intervention Not Indicated, Inpatient TOC Transportation Interventions: Intervention Not Indicated, Inpatient TOC Utilities Interventions: Intervention Not Indicated, Inpatient TOC   Readmission Risk Interventions    03/31/2024   11:48 AM  Readmission Risk Prevention Plan  Transportation Screening Complete  PCP or Specialist Appt within 3-5 Days Complete  HRI or Home Care Consult Complete  Social  Work Consult for Recovery Care Planning/Counseling Complete  Palliative Care Screening Not Applicable  Medication Review Oceanographer) Complete

## 2024-03-31 NOTE — Assessment & Plan Note (Signed)
-   Hold ACEi - Lokelma  x1

## 2024-04-01 DIAGNOSIS — A419 Sepsis, unspecified organism: Secondary | ICD-10-CM | POA: Diagnosis not present

## 2024-04-01 DIAGNOSIS — J189 Pneumonia, unspecified organism: Secondary | ICD-10-CM | POA: Diagnosis not present

## 2024-04-01 LAB — CBC
HCT: 31 % — ABNORMAL LOW (ref 39.0–52.0)
Hemoglobin: 9.9 g/dL — ABNORMAL LOW (ref 13.0–17.0)
MCH: 28.8 pg (ref 26.0–34.0)
MCHC: 31.9 g/dL (ref 30.0–36.0)
MCV: 90.1 fL (ref 80.0–100.0)
Platelets: 148 K/uL — ABNORMAL LOW (ref 150–400)
RBC: 3.44 MIL/uL — ABNORMAL LOW (ref 4.22–5.81)
RDW: 14.9 % (ref 11.5–15.5)
WBC: 10.4 K/uL (ref 4.0–10.5)
nRBC: 0 % (ref 0.0–0.2)

## 2024-04-01 LAB — COMPREHENSIVE METABOLIC PANEL WITH GFR
ALT: 17 U/L (ref 0–44)
AST: 17 U/L (ref 15–41)
Albumin: 3.6 g/dL (ref 3.5–5.0)
Alkaline Phosphatase: 89 U/L (ref 38–126)
Anion gap: 6 (ref 5–15)
BUN: 50 mg/dL — ABNORMAL HIGH (ref 8–23)
CO2: 25 mmol/L (ref 22–32)
Calcium: 8.9 mg/dL (ref 8.9–10.3)
Chloride: 106 mmol/L (ref 98–111)
Creatinine, Ser: 1.31 mg/dL — ABNORMAL HIGH (ref 0.61–1.24)
GFR, Estimated: 57 mL/min — ABNORMAL LOW (ref >60)
Glucose, Bld: 144 mg/dL — ABNORMAL HIGH (ref 70–99)
Potassium: 5 mmol/L (ref 3.5–5.1)
Sodium: 137 mmol/L (ref 135–145)
Total Bilirubin: 0.7 mg/dL (ref 0.0–1.2)
Total Protein: 6.3 g/dL — ABNORMAL LOW (ref 6.5–8.1)

## 2024-04-01 LAB — LEGIONELLA PNEUMOPHILA SEROGP 1 UR AG: L. pneumophila Serogp 1 Ur Ag: NEGATIVE

## 2024-04-01 NOTE — Progress Notes (Signed)
  Progress Note   Patient: [name] FMW:[id number] DOB: October 17, 1950 DOA: 03/30/2024     2 DOS: the patient was seen and examined on 04/01/2024 at 10:50 AM      Brief hospital course: 73 y.o. M  with COPD on 2L nocturnal O2 only, dCHF, NSCLC of RUL s/p SBRT, non-Hodgkin's lymphoma, hypothyroidism, A-fib on Eliquis , HTN, and HLD who presented with pneumonia sepsis.     Assessment and Plan: * Sepsis due to pneumonia [facility]) Presented with tachycardia, tachypnea and multifocal pneumonia.  Has sepsis without end organ damage.  Procalcitonin 0.26.  MRSA nares negative.  Respiratory virus panel negative. - Continue Rocephin  and azithromycin , day 3 of 5 - Follow sputum culture - Wean oxygen  as able - Pulmonary toilet   COPD (chronic obstructive pulmonary disease) (HCC) With exacerbation Acute on chronic respiratory failure At baseline the patient uses 2 L oxygen  while sleeping, but during the day his oxygen  saturations is mostly normal on room air.  Here, today again, he desaturated on room air while walking. - Continue prednisone  - Continue scheduled and PRN bronchodilators - Continue ICS/LABA/LAMA   Hyperkalemia Still fairly high but improved - Hold ACE inhibitor at discharge  Obesity Class 1   NHL (non-Hodgkin's lymphoma) (HCC)    Hypothyroidism - Continue levothyroxine   Atrial fibrillation (HCC) Permanent. Rate controlled - Continue Eliquis  - Continue metoprolol   Squamous cell carcinoma lung (HCC)    Essential hypertension Chronic diastolic CHF Pulmonary hypertension No evidence of fluid overload Blood pressure soft - Continue metoprolol  - Hold furosemide  and amlodipine  today - Hold lisinopril     Normocytic anemia Due to cancer, stable with no clinical bleeding         Subjective: Patient feels considerably better, his cough is improved.     Physical Exam: BP (!) 94/51 (BP Location: Right Arm)   Pulse 65   Temp 97.7 F (36.5 C)   Resp  18   Ht 6' (1.829 m)   SpO2 96%   BMI 29.00 kg/m   Adult male, sitting up in bed, ambulating in the hall, appears short of breath with exertion RRR, no murmurs, no peripheral edema Respiratory rate normal, lungs diminished overall, rales from yesterday improved, still mild wheezing Abdomen soft, no tenderness palpation, no guarding Attention normal, affect normal, judgment and insight appear normal    Data Reviewed: Basic metabolic panel shows potassium down to 5, sodium normal, creatinine stable at 1.3 LFTs normal CBC shows stable anemia    Family Communication: Wife at the bedside    Disposition: Status is: Inpatient 73 year old M with COPD on nocturnal oxygen , history of cancer who presented with pneumonia and COPD exacerbation.  He is close to discharge today, but desaturated again to 86% with ambulation with me, and seems short of breath, so I recommend 1 more day hospitalization, likely home tomorrow        Author: Lonni SHAUNNA Dalton, MD 04/01/2024 3:47 PM  For on call review [email or web address].

## 2024-04-01 NOTE — Plan of Care (Signed)
  Problem: Education: Goal: Knowledge of General Education information will improve Description: Including pain rating scale, medication(s)/side effects and non-pharmacologic comfort measures Outcome: Progressing   Problem: Clinical Measurements: Goal: Will remain free from infection Outcome: Progressing   Problem: Safety: Goal: Ability to remain free from injury will improve Outcome: Progressing   Problem: Respiratory: Goal: Ability to maintain a clear airway will improve Outcome: Progressing Goal: Levels of oxygenation will improve Outcome: Progressing

## 2024-04-01 NOTE — Progress Notes (Signed)
 SATURATION QUALIFICATIONS: (This note is used to comply with regulatory documentation for home oxygen )  Patient Saturations on Room Air at Rest = 93%  Patient Saturations on Room Air while Ambulating = 87%  Patient Saturations on 2 Liters of oxygen  while Ambulating = 92%  Please briefly explain why patient needs home oxygen :patients  o2 drop to 87%. while walking in room air

## 2024-04-01 NOTE — Plan of Care (Signed)
  Problem: Health Behavior/Discharge Planning: Goal: Ability to manage health-related needs will improve Outcome: Progressing   Problem: Clinical Measurements: Goal: Diagnostic test results will improve Outcome: Progressing   Problem: Pain Managment: Goal: General experience of comfort will improve and/or be controlled Outcome: Progressing

## 2024-04-02 DIAGNOSIS — A419 Sepsis, unspecified organism: Secondary | ICD-10-CM | POA: Diagnosis not present

## 2024-04-02 DIAGNOSIS — J189 Pneumonia, unspecified organism: Secondary | ICD-10-CM | POA: Diagnosis not present

## 2024-04-02 LAB — BASIC METABOLIC PANEL WITH GFR
Anion gap: 8 (ref 5–15)
BUN: 39 mg/dL — ABNORMAL HIGH (ref 8–23)
CO2: 23 mmol/L (ref 22–32)
Calcium: 9.1 mg/dL (ref 8.9–10.3)
Chloride: 108 mmol/L (ref 98–111)
Creatinine, Ser: 1 mg/dL (ref 0.61–1.24)
GFR, Estimated: >60 mL/min (ref >60)
Glucose, Bld: 100 mg/dL — ABNORMAL HIGH (ref 70–99)
Potassium: 4.4 mmol/L (ref 3.5–5.1)
Sodium: 139 mmol/L (ref 135–145)

## 2024-04-02 LAB — CULTURE, RESPIRATORY W GRAM STAIN

## 2024-04-02 LAB — CBC
HCT: 33.7 % — ABNORMAL LOW (ref 39.0–52.0)
Hemoglobin: 10.2 g/dL — ABNORMAL LOW (ref 13.0–17.0)
MCH: 28.3 pg (ref 26.0–34.0)
MCHC: 30.3 g/dL (ref 30.0–36.0)
MCV: 93.4 fL (ref 80.0–100.0)
Platelets: 178 K/uL (ref 150–400)
RBC: 3.61 MIL/uL — ABNORMAL LOW (ref 4.22–5.81)
RDW: 15 % (ref 11.5–15.5)
WBC: 10.6 K/uL — ABNORMAL HIGH (ref 4.0–10.5)
nRBC: 0 % (ref 0.0–0.2)

## 2024-04-02 LAB — IRON AND TIBC
Iron: 32 ug/dL — ABNORMAL LOW (ref 45–182)
Saturation Ratios: 10 % — ABNORMAL LOW (ref 17.9–39.5)
TIBC: 311 ug/dL (ref 250–450)
UIBC: 279 ug/dL

## 2024-04-02 LAB — FERRITIN: Ferritin: 80 ng/mL (ref 24–336)

## 2024-04-02 MED ORDER — IRON 325 (65 FE) MG PO TABS: 1.0000 | ORAL_TABLET | Freq: Every day | ORAL | 0 refills | Status: DC

## 2024-04-02 MED ORDER — PREDNISONE 20 MG PO TABS: 40.0000 mg | ORAL_TABLET | Freq: Every day | ORAL | 0 refills | Status: DC

## 2024-04-02 MED ORDER — CIPROFLOXACIN HCL 500 MG PO TABS: 500.0000 mg | ORAL_TABLET | Freq: Two times a day (BID) | ORAL | 0 refills | Status: AC

## 2024-04-02 MED ORDER — AMOXICILLIN-POT CLAVULANATE 875-125 MG PO TABS: 1.0000 | ORAL_TABLET | Freq: Two times a day (BID) | ORAL | 0 refills | Status: DC

## 2024-04-02 NOTE — TOC Progression Note (Signed)
 Transition of Care [facility]) - Progression Note    Patient Details  Name: STEWARD SAMES MRN: [id number] Date of Birth: [birth year]  Transition of Care [facility]) CM/SW Contact  Sonda Manuella Quill, RN Phone Number: 04/02/2024, 11:17 AM  Clinical Narrative:    Orders received for home oxygen ; no con't home oxygen  needed per today's amb sat note; pt says he has home oxygen  for night use only; TOC us  following.   Expected Discharge Plan: Home/Self Care Barriers to Discharge: Continued Medical Work up  Expected Discharge Plan and Services   Discharge Planning Services: CM Consult   Living arrangements for the past 2 months: Single Family Home Expected Discharge Date: 04/01/24                                     Social Determinants of Health (SDOH) Interventions SDOH Screenings   Food Insecurity: No Food Insecurity (03/31/2024)  Housing: Low Risk  (03/31/2024)  Transportation Needs: No Transportation Needs (03/31/2024)  Utilities: Not At Risk (03/31/2024)  Depression (PHQ2-9): Low Risk  (01/24/2024)  Social Connections: Socially Integrated (03/31/2024)  Tobacco Use: Medium Risk (03/30/2024)    Readmission Risk Interventions    03/31/2024   11:48 AM  Readmission Risk Prevention Plan  Transportation Screening Complete  PCP or Specialist Appt within 3-5 Days Complete  HRI or Home Care Consult Complete  Social Work Consult for Recovery Care Planning/Counseling Complete  Palliative Care Screening Not Applicable  Medication Review Oceanographer) Complete

## 2024-04-02 NOTE — Progress Notes (Cosign Needed Addendum)
 SATURATION QUALIFICATIONS: (This note is used to comply with regulatory documentation for home oxygen)  Patient Saturations on Room Air at Rest = 95%  Patient Saturations on Room Air while Ambulating = 90%  Patient Saturations on 2 Liters of oxygen while Ambulating = 98%  Please briefly explain why patient needs home oxygen:

## 2024-04-02 NOTE — Plan of Care (Signed)
  Problem: Education: Goal: Knowledge of General Education information will improve Description: Including pain rating scale, medication(s)/side effects and non-pharmacologic comfort measures Outcome: Progressing   Problem: Clinical Measurements: Goal: Will remain free from infection Outcome: Progressing Goal: Diagnostic test results will improve Outcome: Progressing Goal: Respiratory complications will improve Outcome: Progressing   Problem: Activity: Goal: Risk for activity intolerance will decrease Outcome: Progressing   Problem: Nutrition: Goal: Adequate nutrition will be maintained Outcome: Progressing   Problem: Respiratory: Goal: Ability to maintain a clear airway will improve Outcome: Progressing

## 2024-04-02 NOTE — TOC Transition Note (Signed)
 Transition of Care [facility]) - Discharge Note   Patient Details  Name: [name] MRN: [id number] Date of Birth: [birth year]  Transition of Care [facility]) CM/SW Contact:  Sonda Manuella Quill, RN Phone Number: 04/02/2024, 3:01 PM   Clinical Narrative:    D/C orders received; no TOC needs.   Final next level of care: Home/Self Care Barriers to Discharge: No Barriers Identified   Patient Goals and CMS Choice Patient states their goals for this hospitalization and ongoing recovery are:: home CMS Medicare.gov Compare Post Acute Care list provided to:: Patient   [place] ownership interest in [facility].provided to:: Patient    Discharge Placement                       Discharge Plan and Services Additional resources added to the After Visit Summary for     Discharge Planning Services: CM Consult                                 Social Drivers of Health (SDOH) Interventions SDOH Screenings   Food Insecurity: No Food Insecurity (03/31/2024)  Housing: Low Risk  (03/31/2024)  Transportation Needs: No Transportation Needs (03/31/2024)  Utilities: Not At Risk (03/31/2024)  Depression (PHQ2-9): Low Risk  (01/24/2024)  Social Connections: Socially Integrated (03/31/2024)  Tobacco Use: Medium Risk (03/30/2024)     Readmission Risk Interventions    03/31/2024   11:48 AM  Readmission Risk Prevention Plan  Transportation Screening Complete  PCP or Specialist Appt within 3-5 Days Complete  HRI or Home Care Consult Complete  Social Work Consult for Recovery Care Planning/Counseling Complete  Palliative Care Screening Not Applicable  Medication Review Oceanographer) Complete

## 2024-04-02 NOTE — Discharge Instructions (Signed)
 Please take your antibiotics as prescribed.   Please follow up with your PCP within one week.

## 2024-04-05 ENCOUNTER — Other Ambulatory Visit (HOSPITAL_BASED_OUTPATIENT_CLINIC_OR_DEPARTMENT_OTHER): Payer: Self-pay | Admitting: Student

## 2024-04-05 ENCOUNTER — Ambulatory Visit (INDEPENDENT_AMBULATORY_CARE_PROVIDER_SITE_OTHER): Admitting: Student

## 2024-04-05 ENCOUNTER — Encounter (HOSPITAL_BASED_OUTPATIENT_CLINIC_OR_DEPARTMENT_OTHER): Payer: Self-pay | Admitting: Student

## 2024-04-05 VITALS — BP 134/64 | HR 69 | Temp 98.4°F | Resp 16 | Ht 72.0 in | Wt 208.9 lb

## 2024-04-05 DIAGNOSIS — K5909 Other constipation: Secondary | ICD-10-CM

## 2024-04-05 DIAGNOSIS — I482 Chronic atrial fibrillation, unspecified: Secondary | ICD-10-CM | POA: Diagnosis not present

## 2024-04-05 DIAGNOSIS — I5032 Chronic diastolic (congestive) heart failure: Secondary | ICD-10-CM

## 2024-04-05 DIAGNOSIS — J189 Pneumonia, unspecified organism: Secondary | ICD-10-CM | POA: Diagnosis not present

## 2024-04-05 DIAGNOSIS — Z09 Encounter for follow-up examination after completed treatment for conditions other than malignant neoplasm: Secondary | ICD-10-CM | POA: Diagnosis not present

## 2024-04-05 DIAGNOSIS — I10 Essential (primary) hypertension: Secondary | ICD-10-CM | POA: Diagnosis not present

## 2024-04-05 DIAGNOSIS — J439 Emphysema, unspecified: Secondary | ICD-10-CM

## 2024-04-05 DIAGNOSIS — J9611 Chronic respiratory failure with hypoxia: Secondary | ICD-10-CM

## 2024-04-05 DIAGNOSIS — D692 Other nonthrombocytopenic purpura: Secondary | ICD-10-CM | POA: Insufficient documentation

## 2024-04-05 DIAGNOSIS — E039 Hypothyroidism, unspecified: Secondary | ICD-10-CM

## 2024-04-05 DIAGNOSIS — E875 Hyperkalemia: Secondary | ICD-10-CM

## 2024-04-05 NOTE — Assessment & Plan Note (Signed)
 Recent discharge from hospital. Rapid onset of symptoms including fever, chills, and decreased oxygen  levels led to hospitalization. Diagnosed with multifocal pneumonia and sepsis. Treated with IV antibiotics and discharged with azithromycin  and oral steroids. Currently improved but he reports that he is not at his baseline.  Complicated by COPD. - Order chest x-ray in six weeks to assess resolution of pneumonia - Continue azithromycin  as prescribed - Use nebulizer every six hours as needed - Use oxygen  at night as needed

## 2024-04-05 NOTE — Assessment & Plan Note (Signed)
 Chronic, stable.  Rate controlled on metoprolol  and anticoagulated on Eliquis . - Continue current regimen.

## 2024-04-05 NOTE — Assessment & Plan Note (Signed)
 Chronic, stable.COPD with baseline wheezing. Currently using a nebulizer every six hours and oxygen  at night. Symptoms are well-managed with current treatment. - Continue current COPD management with nebulizer and oxygen  use as needed - Continue inhaler therapy.

## 2024-04-05 NOTE — Assessment & Plan Note (Signed)
 Chronic issue maintained on oxygen .  Continue nightly oxygen .

## 2024-04-05 NOTE — Assessment & Plan Note (Signed)
 Chronic, stable.Primary hypertension, well-controlled on lisinopril . - Continue lisinopril  as prescribed

## 2024-04-05 NOTE — Patient Instructions (Addendum)
 It was nice to see you today!  As we discussed in clinic:  Please let me know if you have any issues, but otherwise we will get a chest xray in 6 weeks just to make sure everything resolved.  If you have any problems before your next visit feel free to message me via MyChart (minor issues or questions) or call the office, otherwise you may reach out to schedule an office visit.  Thank you! Linette Gunderson, PA-C

## 2024-04-05 NOTE — Assessment & Plan Note (Signed)
 Likely at least in part due to presence of Eliquis  therapy which he is on for A-fib.  Continue to monitor.

## 2024-04-05 NOTE — Assessment & Plan Note (Signed)
 Chronic, stable.  Last thyroid  labs were within normal limits. - Continue current levothyroxine  therapy.

## 2024-04-05 NOTE — Assessment & Plan Note (Signed)
 Constipation likely exacerbated by antibiotic use. Managing with stool softeners and considering Miralax  if needed. - Use stool softeners as needed - Add Miralax  if constipation persists - Continue Linzess  daily

## 2024-04-05 NOTE — Progress Notes (Signed)
 Established Patient Office Visit  Subjective   Patient ID: [name], male    DOB: [birth year]  Age: 73 y.o. MRN: [id number]  Chief Complaint  Patient presents with   Hospitalization Follow-up    Hospital follow up on PNA. Wheezing and coughing a little.     HPI  Discussed the use of AI scribe software for clinical note transcription with the patient, who gave verbal consent to proceed.  History of Present Illness   [name] is a 73 year old male with COPD and hypertension who presents with recent hospitalization for multifocal pneumonia and sepsis.  He experienced a sudden onset of symptoms on a Thursday evening, feeling unwell by 5 PM and experiencing a drop in oxygen  levels, fever, and chills by 6 PM. He was hospitalized and diagnosed with multifocal pneumonia and sepsis. During his hospital stay, he received IV antibiotics and was discharged with azithromycin  and oral steroids, although he did not take the steroids. He feels significantly better now.  He has a history of COPD and uses a nebulizer every six hours, sometimes every four hours depending on his condition, and uses oxygen  every night. He has a nebulizer and sufficient solution at home.  He has a history of hypothyroidism and primary hypertension, currently managed with Trelegy, Linzess , and Lisinopril . His blood pressure is stable.  He experienced bruising on his arms from blood draws during his hospital stay, which he attributes to being on Eliquis . He also mentioned a previous wrist issue for which he received an injection that provided significant relief.  He reports constipation, which he attributes to the antibiotics, but it is currently managed with stool softeners and Miralax  as needed.      Patient Active Problem List   Diagnosis Date Noted   Senile purpura (HCC) 04/05/2024   Hyperkalemia 03/31/2024   Multifocal pneumonia 03/31/2024   Sepsis due to pneumonia (HCC) 03/30/2024   Statin myopathy  02/21/2024   Chronic constipation 02/21/2024   Primary osteoarthritis of left distal radioulnar joint 02/21/2024   Primary osteoarthritis of right distal radioulnar joint 01/28/2024   Idiopathic chronic gout of multiple sites without tophus 01/24/2024   Cubital tunnel syndrome on right 07/09/2023   COPD with acute exacerbation (HCC) 05/26/2023   Chronic diastolic CHF (congestive heart failure) (HCC) 05/07/2023   Acute on chronic diastolic CHF (congestive heart failure) (HCC) 05/06/2023   Acute on chronic respiratory failure with hypoxia (HCC) 05/06/2023   DOE (dyspnea on exertion) 04/07/2023   Congestive heart failure (HCC) 01/26/2023   GERD (gastroesophageal reflux disease) 01/26/2023   Obesity 01/26/2023   Polycythemia 01/26/2023   Pulmonary hypertension (HCC) 01/26/2023   Aortic atherosclerosis (HCC) 01/26/2023   Coronary artery calcification 01/26/2023   Carpal tunnel syndrome, right upper limb 11/03/2022   Lesion of ulnar nerve, right upper limb 11/03/2022   Numbness of right hand 06/28/2022   Status post bronchoscopy with biopsy    Port catheter in place 08/20/2016   NHL (non-Hodgkin's lymphoma) (HCC) 09/25/2015   Lymphadenopathy syndrome 09/12/2015   Mediastinal adenopathy 09/12/2015   Chronic respiratory failure with hypoxia (HCC) 08/27/2015   Hypothyroidism 03/30/2012   Depression 12/03/2011   Pure hypercholesterolemia 12/03/2011   Atrial fibrillation (HCC) 10/26/2011   Other dysphagia 11/11/2010   Squamous cell carcinoma lung (HCC) 09/26/2010   BPH (benign prostatic hyperplasia) 2011   ERECTILE DYSFUNCTION 09/24/2008   Essential hypertension 06/05/2008   COPD (chronic obstructive pulmonary disease) (HCC) 06/05/2008   Past Medical History:  Diagnosis Date  Abnormal finding on GI tract imaging 09/07/2023   Acute diastolic CHF (congestive heart failure) (HCC) 05/07/2023   Acute on chronic diastolic CHF (congestive heart failure) (HCC) 05/06/2023   Acute on chronic  respiratory failure with hypoxia (HCC) 05/06/2023   Aortic atherosclerosis (HCC) 01/26/2023   Arthritis    Atrial fibrillation (HCC) 10/26/2011   Benign neoplasm of colon 09/07/2023   BPH (benign prostatic hyperplasia) 2011   BRONCHOPNEUMONIA ORGANISM UNSPECIFIED 06/12/2010         Replacing diagnoses that were inactivated after the 12/21/22 regulatory import   Carpal tunnel syndrome, right upper limb 11/03/2022   Chronic respiratory failure with hypoxia (HCC) 08/27/2015   Congestive heart failure (HCC)    COPD (chronic obstructive pulmonary disease) (HCC)    COPD with acute exacerbation (HCC) 05/26/2023   Coronary artery calcification 01/26/2023   Cubital tunnel syndrome on right 07/09/2023   Depression    DOE (dyspnea on exertion) 04/07/2023   Essential hypertension 06/05/2008   Qualifier: Diagnosis of   By: [name]), [name]       GERD (gastroesophageal reflux disease)    Hypothyroidism 03/30/2012   Lesion of ulnar nerve, right upper limb 11/03/2022   Lymphadenopathy syndrome 09/12/2015   Mediastinal adenopathy 09/12/2015   NHL (non-Hodgkin's lymphoma) (HCC) 09/25/2015   Numbness of right hand 06/28/2022   Obesity    Open wound of right hand without foreign body 07/27/2021   Other dysphagia 11/11/2010   Qualifier: Diagnosis of   By: [name], [name]       Polycythemia 01/26/2023   Port catheter in place 08/20/2016   Pulmonary hypertension (HCC)    Pure hypercholesterolemia 12/03/2011   Recurrent pneumonia 02/10/2012   Routine general medical examination at a health care facility 12/03/2011   Squamous cell carcinoma lung (HCC) 09/26/2010   S/p LUL resection Dr Brantley dec 2011      Status post bronchoscopy with biopsy    Stiffness of finger joint of right hand 09/24/2021   Testicular swelling 09/12/2015   Social History   Tobacco Use   Smoking status: Former    Current packs/day: 0.00    Average packs/day: 3.0 packs/day for 43.0 years (129.0 ttl pk-yrs)     Types: Cigarettes    Start date: 10/22/1958    Quit date: 10/22/2001    Years since quitting: 22.4    Passive exposure: Current   Smokeless tobacco: Never  Vaping Use   Vaping status: Never Used  Substance Use Topics   Alcohol  use: No   Drug use: No   Allergies  Allergen Reactions   Lipitor [Atorvastatin ] Other (See Comments)    Myalgias  Weakness  Fatigue    Omnipaque  [Iohexol ] Itching and Other (See Comments)    When asked about contrast media today, pt stated that last time he had the contrast that he was red all over and itchy from head to toe.       ROS Per HPI.    Objective:     BP 134/64   Pulse 69   Temp 98.4 F (36.9 C) (Oral)   Resp 16   Ht 6' (1.829 m)   Wt 208 lb 14.4 oz (94.8 kg)   SpO2 96%   BMI 28.33 kg/m  BP Readings from Last 3 Encounters:  04/05/24 134/64  04/02/24 120/71  03/28/24 (!) 107/55   Wt Readings from Last 3 Encounters:  04/05/24 208 lb 14.4 oz (94.8 kg)  04/01/24 213 lb 13.5 oz (97 kg)  03/21/24 213 lb  12.8 oz (97 kg)   SpO2 Readings from Last 3 Encounters:  04/05/24 96%  04/02/24 93%  03/28/24 97%      Physical Exam Constitutional:      General: He is not in acute distress.    Appearance: Normal appearance. He is not ill-appearing.  HENT:     Head: Normocephalic and atraumatic.     Right Ear: External ear normal.     Left Ear: External ear normal.     Nose: Nose normal.  Eyes:     Conjunctiva/sclera: Conjunctivae normal.  Cardiovascular:     Rate and Rhythm: Normal rate and regular rhythm.     Pulses: Normal pulses.     Heart sounds: Normal heart sounds. No murmur heard.    No friction rub.  Pulmonary:     Effort: Pulmonary effort is normal. No respiratory distress.     Breath sounds: Wheezing (generalized) and rales (BLL) present. No rhonchi.  Skin:    General: Skin is warm and dry.     Coloration: Skin is not jaundiced or pale.     Comments: Presence of senile purpura on exam  Neurological:     Mental Status:  He is alert.  Psychiatric:        Mood and Affect: Mood normal.        Behavior: Behavior normal.      No results found for any visits on 04/05/24.  Last CBC Lab Results  Component Value Date   WBC 10.6 (H) 04/02/2024   HGB 10.2 (L) 04/02/2024   HCT 33.7 (L) 04/02/2024   MCV 93.4 04/02/2024   MCH 28.3 04/02/2024   RDW 15.0 04/02/2024   PLT 178 04/02/2024   Last metabolic panel Lab Results  Component Value Date   GLUCOSE 100 (H) 04/02/2024   NA 139 04/02/2024   K 4.4 04/02/2024   CL 108 04/02/2024   CO2 23 04/02/2024   BUN 39 (H) 04/02/2024   CREATININE 1.00 04/02/2024   GFRNONAA >60 04/02/2024   CALCIUM  9.1 04/02/2024   PHOS 4.4 10/05/2023   PROT 6.3 (L) 04/01/2024   ALBUMIN 3.6 04/01/2024   LABGLOB 2.1 04/07/2023   BILITOT 0.7 04/01/2024   ALKPHOS 89 04/01/2024   AST 17 04/01/2024   ALT 17 04/01/2024   ANIONGAP 8 04/02/2024   Last lipids Lab Results  Component Value Date   CHOL 156 03/01/2024   HDL 43 03/01/2024   LDLCALC 103 (H) 03/01/2024   LDLDIRECT 99 05/18/2023   TRIG 48 03/01/2024   CHOLHDL 3.6 03/01/2024   Last hemoglobin A1c Lab Results  Component Value Date   HGBA1C 5.6 03/01/2024   Last thyroid  functions Lab Results  Component Value Date   TSH 0.237 (L) 05/27/2023      The 10-year ASCVD risk score (Arnett DK, et al., 2019) is: 26%    Assessment & Plan:   Hospital discharge follow-up -     DG Chest 2 View; Future  Multifocal pneumonia Assessment & Plan: Recent discharge from hospital. Rapid onset of symptoms including fever, chills, and decreased oxygen  levels led to hospitalization. Diagnosed with multifocal pneumonia and sepsis. Treated with IV antibiotics and discharged with azithromycin  and oral steroids. Currently improved but he reports that he is not at his baseline.  Complicated by COPD. - Order chest x-ray in six weeks to assess resolution of pneumonia - Continue azithromycin  as prescribed - Use nebulizer every six hours  as needed - Use oxygen  at night as needed  Orders: -  DG Chest 2 View; Future  Chronic atrial fibrillation (HCC) Assessment & Plan: Chronic, stable.  Rate controlled on metoprolol  and anticoagulated on Eliquis . - Continue current regimen.   Pulmonary emphysema, unspecified emphysema type (HCC) Assessment & Plan: Chronic, stable.COPD with baseline wheezing. Currently using a nebulizer every six hours and oxygen  at night. Symptoms are well-managed with current treatment. - Continue current COPD management with nebulizer and oxygen  use as needed - Continue inhaler therapy.   Hypothyroidism, unspecified type Assessment & Plan: Chronic, stable.  Last thyroid  labs were within normal limits. - Continue current levothyroxine  therapy.   Chronic respiratory failure with hypoxia (HCC) Assessment & Plan: Chronic issue maintained on oxygen .  Continue nightly oxygen .   Chronic diastolic CHF (congestive heart failure) (HCC) Assessment & Plan: Appears to be mostly euvolemic on exam.   - Continue to monitor. - Continue current regimen.   Chronic constipation Assessment & Plan: Constipation likely exacerbated by antibiotic use. Managing with stool softeners and considering Miralax  if needed. - Use stool softeners as needed - Add Miralax  if constipation persists - Continue Linzess  daily   Essential hypertension Assessment & Plan: Chronic, stable.Primary hypertension, well-controlled on lisinopril . - Continue lisinopril  as prescribed   Senile purpura (HCC) Assessment & Plan: Likely at least in part due to presence of Eliquis  therapy which he is on for A-fib.  Continue to monitor.   Reviewed hospital discharge summary from 03/30/2024. Reviewed pulmonology note from 03/21/2024.  Return if symptoms worsen or fail to improve.    Abigayl Hor T Addalynn Kumari, PA-C

## 2024-04-05 NOTE — Assessment & Plan Note (Signed)
 Appears to be mostly euvolemic on exam.   - Continue to monitor. - Continue current regimen.

## 2024-04-05 NOTE — Discharge Summary (Signed)
 Physician Discharge Summary   Patient: [name] MRN: [id number] DOB: [birth year]  Admit date:     03/30/2024  Discharge date: 04/02/2024  Discharge Physician: Alban Pepper   PCP: Iven Lang DASEN, PA-C   Recommendations at discharge:    F/u with PCP in 1 week to ensure symptoms resolving.  BMP to ensure potassium and serum creatinine stable after resuming lisinopril .  Will need iron  panel checked in a few months Will need colonoscopy scheduled 08/2024  Discharge Diagnoses: Principal Problem:   Sepsis due to pneumonia [facility]) Active Problems:   Essential hypertension   COPD (chronic obstructive pulmonary disease) (HCC)   Squamous cell carcinoma lung (HCC)   Atrial fibrillation (HCC)   Hypothyroidism   Chronic respiratory failure with hypoxia (HCC)   NHL (non-Hodgkin's lymphoma) (HCC)   Obesity   Pulmonary hypertension (HCC)   Chronic diastolic CHF (congestive heart failure) (HCC)   Hyperkalemia   Multifocal pneumonia  Resolved Problems:   * No resolved hospital problems. *  Hospital Course: 73 y.o. M  with COPD on 2L home O2, dCHF, NSCLC of RUL s/p SBRT, non-Hodgkin's lymphoma, hypothyroidism, A-fib on Eliquis , HTN, and HLD who presented with pneumonia sepsis.  See by problem below.      Assessment and Plan: Sepsis due to pneumonia POA Presented with tachycardia, tachypnea and multifocal pneumonia.  Has sepsis without end organ damage.  Procalcitonin 0.26.  MRSA nares negative.  Respiratory virus panel negative. He was started on rocephin  and azithromycin . His sputum culture grew ACINETOBACTER CALCOACETICUS/BAUMANNII COMPLEX. And there was intermediate susceptibility to ceftazidime noted. Patient was discharged on ciprofloxacin  for 5 day course. By the time of discharge he was able to be weaned to RA.      COPD (chronic obstructive pulmonary disease) (HCC) With exacerbation Acute on chronic respiratory failure At baseline the patient uses 2 L oxygen  while  sleeping, but during the day his oxygen  saturations is mostly normal on room air.   He was treated for pna per above and started on prednisone . He was able to be weaned to RA. HE will need one more dose of prednisone  on discharge for 5 d course of steroids. He will continue his home inhalers.     AKI  Hyperkalemia Resolved Normalized with improvement in his renal function.  He will need follow up with his PCP in 1 week and repeat of his BMP at that time to ensure potassium remains normal.  Patient renal function improved as well. It appears at baseline his serum creatinine is normal.     Latest Ref Rng & Units 04/02/2024    9:37 AM 04/01/2024    6:32 AM 03/31/2024    4:54 AM  BMP  Glucose 70 - 99 mg/dL 899  855  866   BUN 8 - 23 mg/dL 39  50  39   Creatinine 0.61 - 1.24 mg/dL 8.99  8.68  8.67   Sodium 135 - 145 mmol/L 139  137  138   Potassium 3.5 - 5.1 mmol/L 4.4  5.0  5.4   Chloride 98 - 111 mmol/L 108  106  109   CO2 22 - 32 mmol/L 23  25  21    Calcium  8.9 - 10.3 mg/dL 9.1  8.9  8.9       Obesity Class 1, complicates care    NHL (non-Hodgkin's lymphoma) (HCC)  Noted. Follows with oncology.    Hypothyroidism - Continue levothyroxine    Atrial fibrillation (HCC) Permanent. Rate controlled - Home Eliquis , metoprolol   conitnued    stage IA (T1c, N0, M0) non-small cell lung cancer, squamous cell carcinoma diagnosed in January 2021  S/p radiation in 2021   Essential hypertension Chronic diastolic CHF Pulmonary hypertension On arrival had soft blood pressures in the setting of his pna. His blood pressures improved by the time of discharge. His home amlodipine , lisinopril , lasix , and metoprolol  were continued on discharge.  Patient will follow up with his PCP to check his BMP.    Normocytic anemia Iron  deficiency     Latest Ref Rng & Units 04/02/2024    9:37 AM 04/01/2024    6:32 AM 03/31/2024    4:54 AM  CBC  WBC 4.0 - 10.5 K/uL 10.6  10.4  14.1   Hemoglobin 13.0 - 17.0  g/dL 89.7  9.9  89.9   Hematocrit 39.0 - 52.0 % 33.7  31.0  32.7   Platelets 150 - 400 K/uL 178  148  142    Patient was started on oral iron  supplementation.   He reports that he was told he needed a repeat colonoscopy in 08/2024.            Consultants: None Procedures performed: None  Disposition: Home health Diet recommendation:  Regular diet DISCHARGE MEDICATION: Allergies as of 04/02/2024       Reactions   Lipitor [atorvastatin ] Other (See Comments)   Myalgias  Weakness  Fatigue    Omnipaque  [iohexol ] Itching, Other (See Comments)   When asked about contrast media today, pt stated that last time he had the contrast that he was red all over and itchy from head to toe.         Medication List     TAKE these medications    allopurinol  300 MG tablet Commonly known as: ZYLOPRIM  Take 300 mg by mouth daily.   amLODipine  5 MG tablet Commonly known as: NORVASC  Take 5 mg by mouth daily.   betamethasone  (augmented) 0.05 % lotion Commonly known as: DIPROLENE  Apply 1 Application topically daily as needed (dry skin on scalp).   Buprenorphine HCl-Naloxone HCl 8-2 MG Film Place 0.5-1 Film under the tongue 2 (two) times daily as needed (back pain).   ciclopirox  8 % solution Commonly known as: PENLAC  Apply topically at bedtime. Apply a thin coat over nail. Apply daily over previous coat. Remove weekly with nail polish remover.   ciprofloxacin  500 MG tablet Commonly known as: Cipro  Take 1 tablet (500 mg total) by mouth 2 (two) times daily for 5 days.   Eliquis  5 MG Tabs tablet Generic drug: apixaban  Take 1 tablet by mouth twice daily   ezetimibe  10 MG tablet Commonly known as: ZETIA  Take 10 mg by mouth daily.   finasteride  5 MG tablet Commonly known as: PROSCAR  Take 5 mg by mouth daily.   furosemide  20 MG tablet Commonly known as: LASIX  Take 1 tablet by mouth once daily   ipratropium-albuterol  0.5-2.5 (3) MG/3ML Soln Commonly known as: DUONEB Take 3  mLs by nebulization every 6 (six) hours as needed. What changed: when to take this   Iron  325 (65 Fe) MG Tabs Take 1 tablet (325 mg total) by mouth daily.   levothyroxine  175 MCG tablet Commonly known as: SYNTHROID  Take 175 mcg by mouth daily.   Linzess  290 MCG Caps capsule Generic drug: linaclotide  Take 1 capsule (290 mcg total) by mouth daily before breakfast.   lisinopril  10 MG tablet Commonly known as: ZESTRIL  Take 1 tablet (10 mg total) by mouth daily.   Magnesium  Chloride 64  MG Tabs Take 64 mg by mouth daily in the afternoon.   metoprolol  succinate 25 MG 24 hr tablet Commonly known as: Toprol  XL Take 1 tablet (25 mg total) by mouth daily.   omeprazole  20 MG capsule Commonly known as: PRILOSEC Take 1 capsule (20 mg total) by mouth 2 (two) times daily. What changed: when to take this   ondansetron  4 MG disintegrating tablet Commonly known as: ZOFRAN -ODT Take 1 tablet (4 mg total) by mouth every 8 (eight) hours as needed. What changed:  when to take this reasons to take this   predniSONE  20 MG tablet Commonly known as: DELTASONE  Take 2 tablets (40 mg total) by mouth daily with breakfast.   ProAir  HFA 108 (90 Base) MCG/ACT inhaler Generic drug: albuterol  Inhale 2 puffs into the lungs every 6 (six) hours as needed for wheezing or shortness of breath. What changed: when to take this   albuterol  (2.5 MG/3ML) 0.083% nebulizer solution Commonly known as: PROVENTIL  Take 2.5 mg by nebulization in the morning and at bedtime. What changed: Another medication with the same name was changed. Make sure you understand how and when to take each.   Trelegy Ellipta 100-62.5-25 MCG/INH Aepb Generic drug: Fluticasone -Umeclidin-Vilant Inhale 1 puff into the lungs daily.   zolpidem  10 MG tablet Commonly known as: AMBIEN  Take 10 mg by mouth at bedtime as needed for sleep.        Follow-up Information     [name], Jacob T, PA-C. Schedule an appointment as soon as possible  for a visit.   Specialty: Physician Assistant Contact information: [address] [place] [address] [phone]                Discharge Exam: Fredricka Weights   04/01/24 2005  Weight: 97 kg   Patient noted he had no labored breathing with ambulation. His lowest O2 sat was 91% on RA and this was after ambulation.    Physical Exam  Constitutional: In no distress.  Cardiovascular: Normal rate, regular rhythm. No lower extremity edema  Pulmonary: Non labored breathing on room air, no wheezing or rales.   Abdominal: Soft. Normal bowel sounds. Non distended and non tender Musculoskeletal: Normal range of motion.     Neurological: Alert and oriented to person, place, and time. Non focal  Skin: Skin is warm and dry. Chronic venous stasis changes to bilateral lower extremities    Condition at discharge: improving  The results of significant diagnostics from this hospitalization (including imaging, microbiology, ancillary and laboratory) are listed below for reference.   Imaging Studies: CT Chest Wo Contrast Result Date: 03/30/2024 CLINICAL DATA:  COPD lung cancer low oxygen  saturation EXAM: CT CHEST WITHOUT CONTRAST TECHNIQUE: Multidetector CT imaging of the chest was performed following the standard protocol without IV contrast. RADIATION DOSE REDUCTION: This exam was performed according to the departmental dose-optimization program which includes automated exposure control, adjustment of the mA and/or kV according to patient size and/or use of iterative reconstruction technique. COMPARISON:  CT 02/15/2024, 08/20/2023, 01/23/2023, PET CT 02/12/2023 FINDINGS: Cardiovascular: Limited evaluation without intravenous contrast. Moderate aortic atherosclerosis. No aneurysm. Dilated pulmonary trunk up to 4.8 cm and enlarged central pulmonary vessels consistent with arterial hypertension. Coronary vascular calcification. Right-sided central venous catheter with tip in the SVC. Trace pericardial  effusion Mediastinum/Nodes: Tracheal deviation to the right. Multiple borderline mediastinal lymph nodes. High prevascular lymph nodes measuring up to 10 mm. Left prevascular node measuring 9 mm. Left low paratracheal node measures 12 mm, previously 8 mm. Esophagus  within normal limits. Lungs/Pleura: Redemonstrated sharply marginated area of consolidation in the right upper lobe surrounding fiducial markers and associated with volume loss presumably due to post radiation change. Wedge resection changes in the medial left upper lobe. Interim development of heterogeneous airspace disease in the right middle and left greater than right lower lobes, suspicious for multifocal pneumonia. Trace left-sided pleural effusion. Upper Abdomen: No acute finding. Incompletely visualized complex splenic lesion stable small right adrenal adenoma. Musculoskeletal: No acute or suspicious osseous abnormality. IMPRESSION: 1. Interim development of heterogeneous airspace disease in the right middle and left greater than right lower lobes, suspicious for multifocal pneumonia. Trace left-sided pleural effusion. 2. Slight increase in mediastinal nodes, possibly reactive but attention on follow-up imaging. 3. Stable post radiation changes in the right upper lobe. Wedge resection changes in the medial left upper lobe. 4. Dilated pulmonary trunk and enlarged central pulmonary vessels consistent with arterial hypertension. Aortic Atherosclerosis (ICD10-I70.0). Electronically Signed   By: Luke Bun M.D.   On: 03/30/2024 23:34    Microbiology: Results for orders placed or performed during the hospital encounter of 03/30/24  Respiratory (~20 pathogens) panel by PCR     Status: None   Collection Time: 03/31/24 12:42 AM   Specimen: Nasal Mucosa; Respiratory  Result Value Ref Range Status   Adenovirus NOT DETECTED NOT DETECTED Final   Coronavirus 229E NOT DETECTED NOT DETECTED Final    Comment: (NOTE) The Coronavirus on the  Respiratory Panel, DOES NOT test for the novel  Coronavirus (2019 nCoV)    Coronavirus HKU1 NOT DETECTED NOT DETECTED Final   Coronavirus NL63 NOT DETECTED NOT DETECTED Final   Coronavirus OC43 NOT DETECTED NOT DETECTED Final   Metapneumovirus NOT DETECTED NOT DETECTED Final   Rhinovirus / Enterovirus NOT DETECTED NOT DETECTED Final   Influenza A NOT DETECTED NOT DETECTED Final   Influenza B NOT DETECTED NOT DETECTED Final   Parainfluenza Virus 1 NOT DETECTED NOT DETECTED Final   Parainfluenza Virus 2 NOT DETECTED NOT DETECTED Final   Parainfluenza Virus 3 NOT DETECTED NOT DETECTED Final   Parainfluenza Virus 4 NOT DETECTED NOT DETECTED Final   Respiratory Syncytial Virus NOT DETECTED NOT DETECTED Final   Bordetella pertussis NOT DETECTED NOT DETECTED Final   Bordetella Parapertussis NOT DETECTED NOT DETECTED Final   Chlamydophila pneumoniae NOT DETECTED NOT DETECTED Final   Mycoplasma pneumoniae NOT DETECTED NOT DETECTED Final    Comment: Performed at [facility], [address], [place], [place] [address]  MRSA Next Gen by PCR, Nasal     Status: None   Collection Time: 03/31/24  6:20 AM   Specimen: Nasal Mucosa; Nasal Swab  Result Value Ref Range Status   MRSA by PCR Next Gen NOT DETECTED NOT DETECTED Final    Comment: (NOTE) The GeneXpert MRSA Assay (FDA approved for NASAL specimens only), is one component of a comprehensive MRSA colonization surveillance program. It is not intended to diagnose MRSA infection nor to guide or monitor treatment for MRSA infections. Test performance is not FDA approved in patients less than 36 years old. Performed at [facility], [address] [place], [place] [address]   Culture, Respiratory w Gram Stain     Status: None   Collection Time: 03/31/24  1:17 PM  Result Value Ref Range Status   Specimen Description   Final    EXPECTORATED SPUTUM Performed at [facility], [address],  [place], [place] [address]    Special Requests  Final    NONE Performed at [facility], [address] [place], [place] [address]    Gram Stain   Final    FEW WBC PRESENT, PREDOMINANTLY PMN FEW GRAM POSITIVE RODS Performed at [facility], [address], [place], [place] [address]    Culture   Final    MODERATE ACINETOBACTER CALCOACETICUS/BAUMANNII COMPLEX   Report Status 04/02/2024 FINAL  Final   Organism ID, Bacteria ACINETOBACTER CALCOACETICUS/BAUMANNII COMPLEX  Final      Susceptibility   Acinetobacter calcoaceticus/baumannii complex - MIC*    CEFTAZIDIME 16 INTERMEDIATE Intermediate     CIPROFLOXACIN  <=0.25 SENSITIVE Sensitive     GENTAMICIN <=1 SENSITIVE Sensitive     IMIPENEM <=0.25 SENSITIVE Sensitive     PIP/TAZO 32 INTERMEDIATE Intermediate ug/mL    TRIMETH/SULFA <=20 SENSITIVE Sensitive     AMPICILLIN /SULBACTAM <=2 SENSITIVE Sensitive     * MODERATE ACINETOBACTER CALCOACETICUS/BAUMANNII COMPLEX    Labs: CBC: Recent Labs  Lab 03/30/24 2157 03/31/24 0454 04/01/24 0632 04/02/24 0937  WBC 11.7* 14.1* 10.4 10.6*  NEUTROABS 10.6*  --   --   --   HGB 10.8* 10.0* 9.9* 10.2*  HCT 35.0* 32.7* 31.0* 33.7*  MCV 91.9 93.2 90.1 93.4  PLT 154 142* 148* 178   Basic Metabolic Panel: Recent Labs  Lab 03/30/24 2157 03/31/24 0454 04/01/24 0632 04/02/24 0937  NA 139 138 137 139  K 5.5* 5.4* 5.0 4.4  CL 106 109 106 108  CO2 24 21* 25 23  GLUCOSE 99 133* 144* 100*  BUN 43* 39* 50* 39*  CREATININE 1.27* 1.32* 1.31* 1.00  CALCIUM  9.3 8.9 8.9 9.1   Liver Function Tests: Recent Labs  Lab 03/30/24 2157 04/01/24 0632  AST 24 17  ALT 20 17  ALKPHOS 116 89  BILITOT 0.9 0.7  PROT 7.0 6.3*  ALBUMIN 4.1 3.6   CBG: No results for input(s): GLUCAP in the last 168 hours.  Discharge time spent: greater than 30 minutes.  Signed: Alban Pepper, MD [facility] 04/05/2024

## 2024-04-06 ENCOUNTER — Other Ambulatory Visit (HOSPITAL_BASED_OUTPATIENT_CLINIC_OR_DEPARTMENT_OTHER)

## 2024-04-06 DIAGNOSIS — E875 Hyperkalemia: Secondary | ICD-10-CM

## 2024-04-07 ENCOUNTER — Ambulatory Visit (HOSPITAL_BASED_OUTPATIENT_CLINIC_OR_DEPARTMENT_OTHER): Payer: Self-pay | Admitting: Student

## 2024-04-07 ENCOUNTER — Other Ambulatory Visit (HOSPITAL_BASED_OUTPATIENT_CLINIC_OR_DEPARTMENT_OTHER): Payer: Self-pay

## 2024-04-07 DIAGNOSIS — E875 Hyperkalemia: Secondary | ICD-10-CM

## 2024-04-07 LAB — BASIC METABOLIC PANEL WITH GFR
BUN/Creatinine Ratio: 27 — ABNORMAL HIGH (ref 10–24)
BUN: 36 mg/dL — ABNORMAL HIGH (ref 8–27)
CO2: 22 mmol/L (ref 20–29)
Calcium: 8.9 mg/dL (ref 8.6–10.2)
Chloride: 102 mmol/L (ref 96–106)
Creatinine, Ser: 1.33 mg/dL — ABNORMAL HIGH (ref 0.76–1.27)
Glucose: 141 mg/dL — ABNORMAL HIGH (ref 70–99)
Potassium: 5.6 mmol/L — ABNORMAL HIGH (ref 3.5–5.2)
Sodium: 138 mmol/L (ref 134–144)
eGFR: 56 mL/min/1.73 — ABNORMAL LOW (ref >59)

## 2024-04-10 ENCOUNTER — Other Ambulatory Visit (INDEPENDENT_AMBULATORY_CARE_PROVIDER_SITE_OTHER)

## 2024-04-10 DIAGNOSIS — E875 Hyperkalemia: Secondary | ICD-10-CM

## 2024-04-11 ENCOUNTER — Telehealth (HOSPITAL_BASED_OUTPATIENT_CLINIC_OR_DEPARTMENT_OTHER): Payer: Self-pay | Admitting: Student

## 2024-04-11 NOTE — Telephone Encounter (Signed)
 Copied from CRM [id number]. Topic: Clinical - Lab/Test Results >> Apr 11, 2024  4:28 PM Fonda T wrote: Reason for CRM: Patient calling, requesting to speak to office, regarding repeat labs that he states were urgent.   Called and spoke to office, per office, results have not been resulted, but will be contacted as soon as results are released, and provider reviews results.  Patient informed of above, verbalized understanding. Patient can be reached at [phone], once results are reviewed and final.   Thank you

## 2024-04-12 ENCOUNTER — Other Ambulatory Visit (HOSPITAL_BASED_OUTPATIENT_CLINIC_OR_DEPARTMENT_OTHER): Payer: Self-pay

## 2024-04-12 ENCOUNTER — Other Ambulatory Visit (HOSPITAL_BASED_OUTPATIENT_CLINIC_OR_DEPARTMENT_OTHER): Payer: Self-pay | Admitting: Family Medicine

## 2024-04-12 DIAGNOSIS — E875 Hyperkalemia: Secondary | ICD-10-CM

## 2024-04-12 DIAGNOSIS — I10 Essential (primary) hypertension: Secondary | ICD-10-CM

## 2024-04-12 MED ORDER — HYDROCHLOROTHIAZIDE 12.5 MG PO TABS: 12.5000 mg | ORAL_TABLET | Freq: Every day | ORAL | 0 refills | Status: DC

## 2024-04-12 NOTE — Telephone Encounter (Signed)
 Pt advised and voices undersanding

## 2024-04-13 ENCOUNTER — Telehealth (HOSPITAL_BASED_OUTPATIENT_CLINIC_OR_DEPARTMENT_OTHER): Payer: Self-pay | Admitting: Student

## 2024-04-13 NOTE — Telephone Encounter (Signed)
 Left detailed msg that we received pt's labs from 04/10/2024 already.

## 2024-04-13 NOTE — Telephone Encounter (Signed)
 Copied from CRM [id number]. Topic: Clinical - Lab/Test Results >> Apr 13, 2024 10:40 AM [place] RAMAN wrote: Reason for CRM: Janell from [facility] would like to speak with a nurse regarding lab results  Callback #: [phone]

## 2024-04-14 ENCOUNTER — Other Ambulatory Visit (HOSPITAL_BASED_OUTPATIENT_CLINIC_OR_DEPARTMENT_OTHER): Payer: Self-pay | Admitting: Family Medicine

## 2024-04-14 ENCOUNTER — Other Ambulatory Visit (HOSPITAL_BASED_OUTPATIENT_CLINIC_OR_DEPARTMENT_OTHER)

## 2024-04-14 LAB — BASIC METABOLIC PANEL WITH GFR
BUN/Creatinine Ratio: 24 (ref 10–24)
BUN: 27 mg/dL (ref 8–27)
CO2: 25 mmol/L (ref 20–29)
Calcium: 9.3 mg/dL (ref 8.6–10.2)
Chloride: 102 mmol/L (ref 96–106)
Creatinine, Ser: 1.13 mg/dL (ref 0.76–1.27)
Glucose: 104 mg/dL — ABNORMAL HIGH (ref 70–99)
Potassium: 5 mmol/L (ref 3.5–5.2)
Sodium: 142 mmol/L (ref 134–144)
eGFR: 69 mL/min/1.73 (ref >59)

## 2024-04-17 ENCOUNTER — Inpatient Hospital Stay (HOSPITAL_COMMUNITY)
Admission: EM | Admit: 2024-04-17 | Discharge: 2024-04-21 | DRG: 193 | Disposition: A | Discharge status: Home / Self Care | Attending: Family Medicine | Admitting: Family Medicine

## 2024-04-17 ENCOUNTER — Encounter (HOSPITAL_COMMUNITY): Payer: Self-pay

## 2024-04-17 ENCOUNTER — Telehealth (HOSPITAL_BASED_OUTPATIENT_CLINIC_OR_DEPARTMENT_OTHER): Payer: Self-pay

## 2024-04-17 ENCOUNTER — Emergency Department (HOSPITAL_COMMUNITY)

## 2024-04-17 ENCOUNTER — Inpatient Hospital Stay (HOSPITAL_COMMUNITY)

## 2024-04-17 ENCOUNTER — Ambulatory Visit (HOSPITAL_BASED_OUTPATIENT_CLINIC_OR_DEPARTMENT_OTHER): Admitting: Student

## 2024-04-17 ENCOUNTER — Other Ambulatory Visit (HOSPITAL_BASED_OUTPATIENT_CLINIC_OR_DEPARTMENT_OTHER): Payer: Self-pay | Admitting: Family Medicine

## 2024-04-17 ENCOUNTER — Other Ambulatory Visit: Payer: Self-pay

## 2024-04-17 ENCOUNTER — Encounter (HOSPITAL_BASED_OUTPATIENT_CLINIC_OR_DEPARTMENT_OTHER): Payer: Self-pay

## 2024-04-17 DIAGNOSIS — R0602 Shortness of breath: Secondary | ICD-10-CM | POA: Diagnosis present

## 2024-04-17 DIAGNOSIS — Z9981 Dependence on supplemental oxygen: Secondary | ICD-10-CM

## 2024-04-17 DIAGNOSIS — K219 Gastro-esophageal reflux disease without esophagitis: Secondary | ICD-10-CM | POA: Diagnosis present

## 2024-04-17 DIAGNOSIS — N400 Enlarged prostate without lower urinary tract symptoms: Secondary | ICD-10-CM | POA: Diagnosis present

## 2024-04-17 DIAGNOSIS — J189 Pneumonia, unspecified organism: Principal | ICD-10-CM | POA: Diagnosis present

## 2024-04-17 DIAGNOSIS — R0603 Acute respiratory distress: Secondary | ICD-10-CM | POA: Diagnosis not present

## 2024-04-17 DIAGNOSIS — Z91041 Radiographic dye allergy status: Secondary | ICD-10-CM

## 2024-04-17 DIAGNOSIS — E875 Hyperkalemia: Secondary | ICD-10-CM | POA: Diagnosis present

## 2024-04-17 DIAGNOSIS — I5032 Chronic diastolic (congestive) heart failure: Secondary | ICD-10-CM | POA: Diagnosis present

## 2024-04-17 DIAGNOSIS — I482 Chronic atrial fibrillation, unspecified: Secondary | ICD-10-CM | POA: Diagnosis present

## 2024-04-17 DIAGNOSIS — Z8249 Family history of ischemic heart disease and other diseases of the circulatory system: Secondary | ICD-10-CM

## 2024-04-17 DIAGNOSIS — E039 Hypothyroidism, unspecified: Secondary | ICD-10-CM | POA: Diagnosis present

## 2024-04-17 DIAGNOSIS — E7800 Pure hypercholesterolemia, unspecified: Secondary | ICD-10-CM | POA: Diagnosis present

## 2024-04-17 DIAGNOSIS — J441 Chronic obstructive pulmonary disease with (acute) exacerbation: Secondary | ICD-10-CM | POA: Diagnosis present

## 2024-04-17 DIAGNOSIS — Z923 Personal history of irradiation: Secondary | ICD-10-CM | POA: Diagnosis not present

## 2024-04-17 DIAGNOSIS — Z860100 Personal history of colon polyps, unspecified: Secondary | ICD-10-CM

## 2024-04-17 DIAGNOSIS — Z1152 Encounter for screening for COVID-19: Secondary | ICD-10-CM | POA: Diagnosis not present

## 2024-04-17 DIAGNOSIS — J440 Chronic obstructive pulmonary disease with acute lower respiratory infection: Secondary | ICD-10-CM | POA: Diagnosis present

## 2024-04-17 DIAGNOSIS — I2510 Atherosclerotic heart disease of native coronary artery without angina pectoris: Secondary | ICD-10-CM | POA: Diagnosis present

## 2024-04-17 DIAGNOSIS — Z7951 Long term (current) use of inhaled steroids: Secondary | ICD-10-CM

## 2024-04-17 DIAGNOSIS — I272 Pulmonary hypertension, unspecified: Secondary | ICD-10-CM | POA: Diagnosis present

## 2024-04-17 DIAGNOSIS — Z9221 Personal history of antineoplastic chemotherapy: Secondary | ICD-10-CM

## 2024-04-17 DIAGNOSIS — E669 Obesity, unspecified: Secondary | ICD-10-CM | POA: Diagnosis present

## 2024-04-17 DIAGNOSIS — Z7901 Long term (current) use of anticoagulants: Secondary | ICD-10-CM | POA: Diagnosis not present

## 2024-04-17 DIAGNOSIS — I700 Atherosclerosis of aorta: Secondary | ICD-10-CM | POA: Diagnosis present

## 2024-04-17 DIAGNOSIS — Z91048 Other nonmedicinal substance allergy status: Secondary | ICD-10-CM

## 2024-04-17 DIAGNOSIS — I110 Hypertensive heart disease with heart failure: Secondary | ICD-10-CM | POA: Diagnosis present

## 2024-04-17 DIAGNOSIS — Z87891 Personal history of nicotine dependence: Secondary | ICD-10-CM | POA: Diagnosis not present

## 2024-04-17 DIAGNOSIS — Z888 Allergy status to other drugs, medicaments and biological substances status: Secondary | ICD-10-CM

## 2024-04-17 DIAGNOSIS — Z79899 Other long term (current) drug therapy: Secondary | ICD-10-CM

## 2024-04-17 DIAGNOSIS — J9621 Acute and chronic respiratory failure with hypoxia: Secondary | ICD-10-CM | POA: Diagnosis present

## 2024-04-17 DIAGNOSIS — Z79890 Hormone replacement therapy (postmenopausal): Secondary | ICD-10-CM | POA: Diagnosis not present

## 2024-04-17 DIAGNOSIS — Z8572 Personal history of non-Hodgkin lymphomas: Secondary | ICD-10-CM

## 2024-04-17 DIAGNOSIS — Z85118 Personal history of other malignant neoplasm of bronchus and lung: Secondary | ICD-10-CM

## 2024-04-17 HISTORY — DX: Chronic obstructive pulmonary disease with (acute) exacerbation: J44.1

## 2024-04-17 LAB — BASIC METABOLIC PANEL WITH GFR
Anion gap: 7 (ref 5–15)
BUN/Creatinine Ratio: 29 — ABNORMAL HIGH (ref 10–24)
BUN: 32 mg/dL — ABNORMAL HIGH (ref 8–27)
BUN: 31 mg/dL — ABNORMAL HIGH (ref 8–23)
CO2: 23 mmol/L (ref 20–29)
CO2: 29 mmol/L (ref 22–32)
Calcium: 9 mg/dL (ref 8.6–10.2)
Calcium: 9 mg/dL (ref 8.9–10.3)
Chloride: 102 mmol/L (ref 96–106)
Chloride: 99 mmol/L (ref 98–111)
Creatinine, Ser: 1.1 mg/dL (ref 0.76–1.27)
Creatinine, Ser: 1.05 mg/dL (ref 0.61–1.24)
GFR, Estimated: >60 mL/min (ref >60)
Glucose, Bld: 99 mg/dL (ref 70–99)
Glucose: 94 mg/dL (ref 70–99)
Potassium: 6 mmol/L — ABNORMAL HIGH (ref 3.5–5.2)
Potassium: 4.3 mmol/L (ref 3.5–5.1)
Sodium: 138 mmol/L (ref 134–144)
Sodium: 135 mmol/L (ref 135–145)
eGFR: 71 mL/min/1.73 (ref >59)

## 2024-04-17 LAB — CBC
HCT: 34.6 % — ABNORMAL LOW (ref 39.0–52.0)
Hemoglobin: 10.7 g/dL — ABNORMAL LOW (ref 13.0–17.0)
MCH: 27.7 pg (ref 26.0–34.0)
MCHC: 30.9 g/dL (ref 30.0–36.0)
MCV: 89.6 fL (ref 80.0–100.0)
Platelets: 214 K/uL (ref 150–400)
RBC: 3.86 MIL/uL — ABNORMAL LOW (ref 4.22–5.81)
RDW: 14.4 % (ref 11.5–15.5)
WBC: 12.2 K/uL — ABNORMAL HIGH (ref 4.0–10.5)
nRBC: 0 % (ref 0.0–0.2)

## 2024-04-17 LAB — RESP PANEL BY RT-PCR (RSV, FLU A&B, COVID)  RVPGX2
Influenza A by PCR: NEGATIVE
Influenza B by PCR: NEGATIVE
Resp Syncytial Virus by PCR: NEGATIVE
SARS Coronavirus 2 by RT PCR: NEGATIVE

## 2024-04-17 LAB — URINALYSIS, ROUTINE W REFLEX MICROSCOPIC
Bacteria, UA: NONE SEEN
Bilirubin Urine: NEGATIVE
Glucose, UA: NEGATIVE mg/dL
Ketones, ur: NEGATIVE mg/dL
Leukocytes,Ua: NEGATIVE
Nitrite: NEGATIVE
Protein, ur: NEGATIVE mg/dL
Specific Gravity, Urine: 1.012 (ref 1.005–1.030)
pH: 5 (ref 5.0–8.0)

## 2024-04-17 LAB — BRAIN NATRIURETIC PEPTIDE: B Natriuretic Peptide: 331.5 pg/mL — ABNORMAL HIGH (ref 0.0–100.0)

## 2024-04-17 LAB — I-STAT CG4 LACTIC ACID, ED
Lactic Acid, Venous: 0.4 mmol/L — ABNORMAL LOW (ref 0.5–1.9)
Lactic Acid, Venous: 0.8 mmol/L (ref 0.5–1.9)

## 2024-04-17 LAB — PROCALCITONIN: Procalcitonin: 0.45 ng/mL

## 2024-04-17 LAB — SPECIMEN STATUS REPORT

## 2024-04-17 MED ORDER — IPRATROPIUM-ALBUTEROL 0.5-2.5 (3) MG/3ML IN SOLN
3.0000 mL | Freq: Four times a day (QID) | RESPIRATORY_TRACT | Status: DC | PRN
  Administered 2024-04-18 (×2): 3 mL via RESPIRATORY_TRACT
  Filled 2024-04-17 (×2): qty 3

## 2024-04-17 MED ORDER — SODIUM CHLORIDE 3 % IN NEBU
4.0000 mL | INHALATION_SOLUTION | Freq: Two times a day (BID) | RESPIRATORY_TRACT | Status: DC
  Administered 2024-04-18 (×2): 4 mL via RESPIRATORY_TRACT
  Filled 2024-04-17 (×4): qty 4

## 2024-04-17 MED ORDER — SODIUM CHLORIDE 0.9% FLUSH
3.0000 mL | Freq: Two times a day (BID) | INTRAVENOUS | Status: DC
  Administered 2024-04-17 – 2024-04-20 (×6): 3 mL via INTRAVENOUS

## 2024-04-17 MED ORDER — VANCOMYCIN HCL 1500 MG/300ML IV SOLN
1500.0000 mg | INTRAVENOUS | Status: DC
  Filled 2024-04-17: qty 300

## 2024-04-17 MED ORDER — AMLODIPINE BESYLATE 10 MG PO TABS
5.0000 mg | ORAL_TABLET | Freq: Every day | ORAL | Status: DC
  Administered 2024-04-17 – 2024-04-21 (×5): 5 mg via ORAL
  Filled 2024-04-17 (×5): qty 1

## 2024-04-17 MED ORDER — ONDANSETRON HCL 4 MG PO TABS: 4.0000 mg | ORAL_TABLET | Freq: Four times a day (QID) | ORAL | Status: DC | PRN

## 2024-04-17 MED ORDER — VANCOMYCIN HCL 2000 MG/400ML IV SOLN
2000.0000 mg | Freq: Once | INTRAVENOUS | Status: AC
  Administered 2024-04-17: 2000 mg via INTRAVENOUS
  Filled 2024-04-17: qty 400

## 2024-04-17 MED ORDER — SODIUM CHLORIDE 3 % IN NEBU: 4.0000 mL | INHALATION_SOLUTION | Freq: Two times a day (BID) | RESPIRATORY_TRACT | Status: DC

## 2024-04-17 MED ORDER — METHYLPREDNISOLONE SODIUM SUCC 40 MG IJ SOLR
40.0000 mg | Freq: Two times a day (BID) | INTRAMUSCULAR | Status: AC
  Administered 2024-04-17 – 2024-04-18 (×2): 40 mg via INTRAVENOUS
  Filled 2024-04-17 (×2): qty 1

## 2024-04-17 MED ORDER — EZETIMIBE 10 MG PO TABS
10.0000 mg | ORAL_TABLET | Freq: Every day | ORAL | Status: DC
  Administered 2024-04-17 – 2024-04-21 (×5): 10 mg via ORAL
  Filled 2024-04-17 (×5): qty 1

## 2024-04-17 MED ORDER — SODIUM CHLORIDE 0.9 % IV SOLN
2.0000 g | Freq: Three times a day (TID) | INTRAVENOUS | Status: DC
  Administered 2024-04-17 – 2024-04-19 (×6): 2 g via INTRAVENOUS
  Filled 2024-04-17 (×6): qty 12.5

## 2024-04-17 MED ORDER — FUROSEMIDE 20 MG PO TABS
20.0000 mg | ORAL_TABLET | Freq: Every day | ORAL | Status: DC
  Administered 2024-04-17 – 2024-04-18 (×2): 20 mg via ORAL
  Filled 2024-04-17 (×2): qty 1

## 2024-04-17 MED ORDER — SODIUM CHLORIDE 0.9 % IV BOLUS
500.0000 mL | Freq: Once | INTRAVENOUS | Status: AC
  Administered 2024-04-17: 500 mL via INTRAVENOUS

## 2024-04-17 MED ORDER — FINASTERIDE 5 MG PO TABS
5.0000 mg | ORAL_TABLET | Freq: Every day | ORAL | Status: DC
  Administered 2024-04-17 – 2024-04-21 (×5): 5 mg via ORAL
  Filled 2024-04-17 (×5): qty 1

## 2024-04-17 MED ORDER — SODIUM CHLORIDE 0.9% FLUSH: 3.0000 mL | INTRAVENOUS | Status: DC | PRN

## 2024-04-17 MED ORDER — SODIUM CHLORIDE 0.9 % IV SOLN
250.0000 mL | INTRAVENOUS | Status: AC | PRN
Start: 2024-04-17 — End: 2024-04-18

## 2024-04-17 MED ORDER — GUAIFENESIN ER 600 MG PO TB12: 600.0000 mg | ORAL_TABLET | Freq: Two times a day (BID) | ORAL | Status: DC

## 2024-04-17 MED ORDER — LEVOTHYROXINE SODIUM 50 MCG PO TABS
175.0000 ug | ORAL_TABLET | Freq: Every day | ORAL | Status: DC
  Administered 2024-04-17 – 2024-04-21 (×5): 175 ug via ORAL
  Filled 2024-04-17 (×5): qty 1

## 2024-04-17 MED ORDER — PREDNISONE 20 MG PO TABS
40.0000 mg | ORAL_TABLET | Freq: Every day | ORAL | Status: AC
  Administered 2024-04-18 – 2024-04-21 (×4): 40 mg via ORAL
  Filled 2024-04-17 (×4): qty 2

## 2024-04-17 MED ORDER — PANTOPRAZOLE SODIUM 40 MG PO TBEC
40.0000 mg | DELAYED_RELEASE_TABLET | Freq: Every day | ORAL | Status: DC
  Administered 2024-04-17 – 2024-04-21 (×5): 40 mg via ORAL
  Filled 2024-04-17 (×5): qty 1

## 2024-04-17 MED ORDER — ONDANSETRON HCL 4 MG/2ML IJ SOLN: 4.0000 mg | Freq: Four times a day (QID) | INTRAMUSCULAR | Status: DC | PRN

## 2024-04-17 MED ORDER — FERROUS SULFATE 325 (65 FE) MG PO TABS
325.0000 mg | ORAL_TABLET | Freq: Every day | ORAL | Status: DC
  Administered 2024-04-17 – 2024-04-21 (×5): 325 mg via ORAL
  Filled 2024-04-17 (×4): qty 1

## 2024-04-17 MED ORDER — ACETAMINOPHEN 325 MG PO TABS
650.0000 mg | ORAL_TABLET | Freq: Once | ORAL | Status: AC | PRN
  Administered 2024-04-17: 650 mg via ORAL
  Filled 2024-04-17: qty 2

## 2024-04-17 MED ORDER — GUAIFENESIN ER 600 MG PO TB12
600.0000 mg | ORAL_TABLET | Freq: Two times a day (BID) | ORAL | Status: DC
  Administered 2024-04-17 – 2024-04-18 (×3): 600 mg via ORAL
  Filled 2024-04-17 (×3): qty 1

## 2024-04-17 MED ORDER — METOPROLOL SUCCINATE ER 25 MG PO TB24
25.0000 mg | ORAL_TABLET | Freq: Every day | ORAL | Status: DC
  Administered 2024-04-17 – 2024-04-21 (×5): 25 mg via ORAL
  Filled 2024-04-17 (×5): qty 1

## 2024-04-17 MED ORDER — BUDESON-GLYCOPYRROL-FORMOTEROL 160-9-4.8 MCG/ACT IN AERO
2.0000 | INHALATION_SPRAY | Freq: Two times a day (BID) | RESPIRATORY_TRACT | Status: DC
  Administered 2024-04-18 – 2024-04-21 (×7): 2 via RESPIRATORY_TRACT
  Filled 2024-04-17: qty 5.9

## 2024-04-17 MED ORDER — ALLOPURINOL 100 MG PO TABS
300.0000 mg | ORAL_TABLET | Freq: Every day | ORAL | Status: DC
  Administered 2024-04-17 – 2024-04-21 (×5): 300 mg via ORAL
  Filled 2024-04-17 (×5): qty 3

## 2024-04-17 NOTE — ED Notes (Signed)
Extra blue top sent to lab.

## 2024-04-17 NOTE — Telephone Encounter (Signed)
 Copied from CRM [id number]. Topic: Appointments - Appointment Cancel/Reschedule >> Apr 17, 2024 12:50 PM Fonda T wrote: Patient/patient representative is calling to cancel or reschedule an appointment. Refer to attachments for appointment information.   Patient is calling to inform symptoms have worsened since spoke to nurse triage, on way the way to hospital, [name], due to delcline in symptoms, oxygen  level decreased, and temperature has increased.Requesting to cancel appointment today.   Attempted to call to inform office, no response.  Sending CRM to inform office.  Thank you

## 2024-04-17 NOTE — H&P (Signed)
 [facility] H&P    Patient Demographics:    [name], is a 73 y.o. male  MRN: [id number]  DOB - Oct 18, 1950  Admit Date - 04/17/2024  Referring MD/NP/PA: Jackquline Rea  Outpatient Primary MD for the patient is [name], Lang DASEN, PA-C  Patient coming from: Home  Chief complaint-shortness of breath   HPI:    [name]  is a 73 y.o. male, with a history of COPD on 2 L/min of oxygen  at home chronically, diastolic heart failure with last echo from May 2024 showed EF 60 to 65%, non-small cell lung cancer of right upper lobe, status post SBRT, non-Hodgkin lymphoma, hypothyroidism, atrial fibrillation on Eliquis , hypertension, hyperlipidemia who was recently discharged from the hospital on 04/05/2024 after treated for pneumonia.  Patient says that he was prescribed 2 diuretics both Lasix  and HCTZ..  Patient was not discharged on HCTZ on 04/05/2024, it was started on 04/12/2024.  As per pharmacy he was started on HCTZ by his PCP, Dr. Norleen Fearing in [place]  As per the patient he was having hard time coughing up phlegm because he was feeling very dry.  Yesterday developed fever, usually uses oxygen  2 L/min at bedtime and as needed during daytime.  Patient says that he was unable to cough up phlegm. He was getting short of breath while sitting only. Denies nausea vomiting or diarrhea Denies abdominal pain Denies passing out Denies headache  In the ED patient was found to have abnormal chest x-ray with possible loculated fluid in the right upper lobe.  Also required full dose pulmonary of oxygen  via nasal cannula.  CT chest without contrast was ordered.  Patient started on vancomycin  and cefepime .    Review of systems:    In addition to the HPI above,   All other systems reviewed and are negative.    Past History of the following :    Past Medical History:  Diagnosis Date   Abnormal finding on GI tract imaging  09/07/2023   Acute diastolic CHF (congestive heart failure) (HCC) 05/07/2023   Acute on chronic diastolic CHF (congestive heart failure) (HCC) 05/06/2023   Acute on chronic respiratory failure with hypoxia (HCC) 05/06/2023   Aortic atherosclerosis (HCC) 01/26/2023   Arthritis    Atrial fibrillation (HCC) 10/26/2011   Benign neoplasm of colon 09/07/2023   BPH (benign prostatic hyperplasia) 2011   BRONCHOPNEUMONIA ORGANISM UNSPECIFIED 06/12/2010         Replacing diagnoses that were inactivated after the 12/21/22 regulatory import   Carpal tunnel syndrome, right upper limb 11/03/2022   Chronic respiratory failure with hypoxia (HCC) 08/27/2015   Congestive heart failure (HCC)    COPD (chronic obstructive pulmonary disease) (HCC)    COPD with acute exacerbation (HCC) 05/26/2023   Coronary artery calcification 01/26/2023   Cubital tunnel syndrome on right 07/09/2023   Depression    DOE (dyspnea on exertion) 04/07/2023   Essential hypertension 06/05/2008   Qualifier: Diagnosis of   By: [name]), [name]       GERD (gastroesophageal reflux disease)    Hypothyroidism 03/30/2012  Lesion of ulnar nerve, right upper limb 11/03/2022   Lymphadenopathy syndrome 09/12/2015   Mediastinal adenopathy 09/12/2015   NHL (non-Hodgkin's lymphoma) (HCC) 09/25/2015   Numbness of right hand 06/28/2022   Obesity    Open wound of right hand without foreign body 07/27/2021   Other dysphagia 11/11/2010   Qualifier: Diagnosis of   By: [name], [name]       Polycythemia 01/26/2023   Port catheter in place 08/20/2016   Pulmonary hypertension (HCC)    Pure hypercholesterolemia 12/03/2011   Recurrent pneumonia 02/10/2012   Routine general medical examination at a health care facility 12/03/2011   Squamous cell carcinoma lung (HCC) 09/26/2010   S/p LUL resection Dr Brantley dec 2011      Status post bronchoscopy with biopsy    Stiffness of finger joint of right hand 09/24/2021   Testicular swelling  09/12/2015      Past Surgical History:  Procedure Laterality Date   APPENDECTOMY     BACK SURGERY     CARDIOVERSION  11/27/2011   Procedure: CARDIOVERSION;  Surgeon: Aleene JINNY Passe, MD;  Location: [facility] ENDOSCOPY;  Service: Cardiovascular;  Laterality: N/A;   COLONOSCOPY     COLONOSCOPY WITH PROPOFOL  N/A 09/07/2023   Procedure: COLONOSCOPY WITH PROPOFOL ;  Surgeon: Leigh Elspeth SQUIBB, MD;  Location: [facility] ENDOSCOPY;  Service: Gastroenterology;  Laterality: N/A;   FUDUCIAL PLACEMENT Right 10/13/2019   Procedure: Placement Of Fuducial;  Surgeon: Shelah Lamar RAMAN, MD;  Location: [facility] OR;  Service: Thoracic;  Laterality: Right;  Right Upper Lobe    LEFT HEART CATH AND CORONARY ANGIOGRAPHY N/A 04/12/2023   Procedure: LEFT HEART CATH AND CORONARY ANGIOGRAPHY;  Surgeon: Anner Alm ORN, MD;  Location: [facility] INVASIVE CV LAB;  Service: Cardiovascular;  Laterality: N/A;   LUNG CANCER SURGERY  2011   POLYPECTOMY  09/07/2023   Procedure: POLYPECTOMY;  Surgeon: Leigh Elspeth SQUIBB, MD;  Location: [facility] ENDOSCOPY;  Service: Gastroenterology;;   UPPER GI ENDOSCOPY     VIDEO BRONCHOSCOPY WITH ENDOBRONCHIAL NAVIGATION N/A 10/13/2019   Procedure: VIDEO BRONCHOSCOPY WITH ENDOBRONCHIAL NAVIGATION;  Surgeon: Shelah Lamar RAMAN, MD;  Location: [facility] OR;  Service: Thoracic;  Laterality: N/A;   VIDEO BRONCHOSCOPY WITH ENDOBRONCHIAL ULTRASOUND N/A 10/13/2019   Procedure: VIDEO BRONCHOSCOPY WITH ENDOBRONCHIAL ULTRASOUND;  Surgeon: Shelah Lamar RAMAN, MD;  Location: [facility] OR;  Service: Thoracic;  Laterality: N/A;      Social History:      Social History   Tobacco Use   Smoking status: Former    Current packs/day: 0.00    Average packs/day: 3.0 packs/day for 43.0 years (129.0 ttl pk-yrs)    Types: Cigarettes    Start date: 10/22/1958    Quit date: 10/22/2001    Years since quitting: 22.5    Passive exposure: Current   Smokeless tobacco: Never  Substance Use Topics   Alcohol  use: No       Family History :     Family History   Problem Relation Age of Onset   Heart disease Father    Cancer Neg Hx    Diabetes Neg Hx    Stroke Neg Hx    Hypertension Neg Hx    Kidney disease Neg Hx       Home Medications:   Prior to Admission medications   Medication Sig Start Date End Date Taking? Authorizing Provider  albuterol  (PROVENTIL ) (2.5 MG/3ML) 0.083% nebulizer solution Take 2.5 mg by nebulization in the morning and at bedtime. 03/12/24   [provider]  allopurinol  (  ZYLOPRIM ) 300 MG tablet Take 300 mg by mouth daily. 01/15/17   [provider]  amLODipine  (NORVASC ) 5 MG tablet Take 5 mg by mouth daily. 01/21/20   [provider]  apixaban  (ELIQUIS ) 5 MG TABS tablet Take 1 tablet by mouth twice daily 01/24/24   Revankar, Rajan R, MD  betamethasone , augmented, (DIPROLENE ) 0.05 % lotion Apply 1 Application topically daily as needed (dry skin on scalp).    [provider]  Buprenorphine HCl-Naloxone HCl 8-2 MG FILM Place 0.5-1 Film under the tongue 2 (two) times daily as needed (back pain). 03/24/23   [provider]  ciclopirox  (PENLAC ) 8 % solution Apply topically at bedtime. Apply a thin coat over nail. Apply daily over previous coat. Remove weekly with nail polish remover. 12/23/23   McCaughan, Dia D, DPM  ezetimibe  (ZETIA ) 10 MG tablet Take 10 mg by mouth daily. 02/09/23   [provider]  Ferrous Sulfate  (IRON ) 325 (65 Fe) MG TABS Take 1 tablet (325 mg total) by mouth daily. Patient not taking: Reported on 04/05/2024 04/02/24   Franchot Novel, MD  finasteride  (PROSCAR ) 5 MG tablet Take 5 mg by mouth daily. 03/13/19   [provider]  furosemide  (LASIX ) 20 MG tablet Take 1 tablet by mouth once daily 03/13/24   Anner Alm ORN, MD  hydrochlorothiazide  (HYDRODIURIL ) 12.5 MG tablet Take 1 tablet (12.5 mg total) by mouth daily. 04/12/24   Dottie Norleen MANO II, MD  ipratropium-albuterol  (DUONEB) 0.5-2.5 (3) MG/3ML SOLN Take 3 mLs by nebulization every 6 (six) hours as  needed. Patient taking differently: Take 3 mLs by nebulization in the morning and at bedtime. 05/10/23   Drusilla Sabas RAMAN, MD  levothyroxine  (SYNTHROID ) 175 MCG tablet Take 175 mcg by mouth daily. 10/29/22   [provider]  LINZESS  290 MCG CAPS capsule Take 1 capsule (290 mcg total) by mouth daily before breakfast. 02/21/24   [name], Jacob T, PA-C  lisinopril  (PRINIVIL ,ZESTRIL ) 10 MG tablet Take 1 tablet (10 mg total) by mouth daily. 12/29/11   Nahser, Aleene PARAS, MD  Magnesium  Chloride 64 MG TABS Take 64 mg by mouth daily in the afternoon. 05/19/23   Carlin Delon BROCKS, NP  metoprolol  succinate (TOPROL  XL) 25 MG 24 hr tablet Take 1 tablet (25 mg total) by mouth daily. 02/08/23   Revankar, Jennifer SAUNDERS, MD  omeprazole  (PRILOSEC) 20 MG capsule Take 1 capsule (20 mg total) by mouth 2 (two) times daily. Patient taking differently: Take 20 mg by mouth daily. 10/13/19   Shelah Lamar RAMAN, MD  ondansetron  (ZOFRAN -ODT) 4 MG disintegrating tablet Take 1 tablet (4 mg total) by mouth every 8 (eight) hours as needed. Patient taking differently: Take 4 mg by mouth daily as needed for nausea or vomiting. 05/17/23   Billy Asberry FALCON, PA-C  PROAIR  HFA 108 (90 Base) MCG/ACT inhaler Inhale 2 puffs into the lungs every 6 (six) hours as needed for wheezing or shortness of breath. Patient taking differently: Inhale 2 puffs into the lungs 3 (three) times daily as needed for wheezing or shortness of breath. 10/02/17   Jerri Keys, MD  TRELEGY ELLIPTA 100-62.5-25 MCG/INH AEPB Inhale 1 puff into the lungs daily. 05/08/19   [provider]  zolpidem  (AMBIEN ) 10 MG tablet Take 10 mg by mouth at bedtime as needed for sleep. 03/24/23   [provider]     Allergies:     Allergies  Allergen Reactions   Lipitor [Atorvastatin ] Other (See Comments)    Myalgias  Weakness  Fatigue    Omnipaque  [Iohexol ] Itching and Other (See Comments)    When asked about contrast media today, pt stated that last time he had the contrast  that he was red all over and itchy from head to toe.      Physical Exam:   Vitals  Blood pressure 120/63, pulse 77, temperature 98.8 F (37.1 C), temperature source Oral, resp. rate 19, SpO2 96%.  1.  General: Appears in no acute distress  2. Psychiatric: Alert, oriented x 3, intact insight and judgment  3. Neurologic: Cranial nerves II through XII grossly intact, no focal deficit noted  4. HEENMT:  Atraumatic normocephalic, extraocular muscles are intact  5. Respiratory : Bilateral rhonchi auscultated  6. Cardiovascular : S1-S2, regular, no murmur auscultated  7. Gastrointestinal:  Abdomen is soft, nontender, no organomegaly  8. Skin:  No rashes noted  9.Musculoskeletal:  No edema of the lower extremities    Data Review:    CBC Recent Labs  Lab 04/17/24 1409  WBC 12.2*  HGB 10.7*  HCT 34.6*  PLT 214  MCV 89.6  MCH 27.7  MCHC 30.9  RDW 14.4   ------------------------------------------------------------------------------------------------------------------  Results for orders placed or performed during the hospital encounter of 04/17/24 (from the past 48 hours)  Resp panel by RT-PCR (RSV, Flu A&B, Covid) Anterior Nasal Swab     Status: None   Collection Time: 04/17/24  1:25 PM   Specimen: Anterior Nasal Swab  Result Value Ref Range   SARS Coronavirus 2 by RT PCR NEGATIVE NEGATIVE    Comment: (NOTE) SARS-CoV-2 target nucleic acids are NOT DETECTED.  The SARS-CoV-2 RNA is generally detectable in upper respiratory specimens during the acute phase of infection. The lowest concentration of SARS-CoV-2 viral copies this assay can detect is 138 copies/mL. A negative result does not preclude SARS-Cov-2 infection and should not be used as the sole basis for treatment or other patient management decisions. A negative result may occur with  improper specimen collection/handling, submission of specimen other than nasopharyngeal swab, presence of viral  mutation(s) within the areas targeted by this assay, and inadequate number of viral copies(<138 copies/mL). A negative result must be combined with clinical observations, patient history, and epidemiological information. The expected result is Negative.  Fact Sheet for Patients:  BloggerCourse.com  Fact Sheet for Healthcare Providers:  SeriousBroker.it  This test is no t yet approved or cleared by the [place]  FDA and  has been authorized for detection and/or diagnosis of SARS-CoV-2 by FDA under an Emergency Use Authorization (EUA). This EUA will remain  in effect (meaning this test can be used) for the duration of the COVID-19 declaration under Section 564(b)(1) of the Act, 21 U.S.C.section 360bbb-3(b)(1), unless the authorization is terminated  or revoked sooner.       Influenza A by PCR NEGATIVE NEGATIVE   Influenza B by PCR NEGATIVE NEGATIVE    Comment: (NOTE) The Xpert Xpress SARS-CoV-2/FLU/RSV plus assay is intended as an aid in the diagnosis of influenza from Nasopharyngeal swab specimens and should not be used as a sole basis for treatment. Nasal washings and aspirates are unacceptable for Xpert Xpress SARS-CoV-2/FLU/RSV testing.  Fact Sheet for Patients: BloggerCourse.com  Fact Sheet for Healthcare Providers: SeriousBroker.it  This test is not yet approved or cleared by the [place]  FDA and has been authorized for detection and/or diagnosis of SARS-CoV-2 by FDA under an Emergency Use Authorization (EUA). This EUA will remain in effect (meaning this test can be used) for the duration  of the COVID-19 declaration under Section 564(b)(1) of the Act, 21 U.S.C. section 360bbb-3(b)(1), unless the authorization is terminated or revoked.     Resp Syncytial Virus by PCR NEGATIVE NEGATIVE    Comment: (NOTE) Fact Sheet for  Patients: BloggerCourse.com  Fact Sheet for Healthcare Providers: SeriousBroker.it  This test is not yet approved or cleared by the [place]  FDA and has been authorized for detection and/or diagnosis of SARS-CoV-2 by FDA under an Emergency Use Authorization (EUA). This EUA will remain in effect (meaning this test can be used) for the duration of the COVID-19 declaration under Section 564(b)(1) of the Act, 21 U.S.C. section 360bbb-3(b)(1), unless the authorization is terminated or revoked.  Performed at [facility], [address], [place], [place] [address]   Urinalysis, Routine w reflex microscopic -Urine, Clean Catch     Status: Abnormal   Collection Time: 04/17/24  1:26 PM  Result Value Ref Range   Color, Urine YELLOW YELLOW   APPearance CLEAR CLEAR   Specific Gravity, Urine 1.012 1.005 - 1.030   pH 5.0 5.0 - 8.0   Glucose, UA NEGATIVE NEGATIVE mg/dL   Hgb urine dipstick SMALL (A) NEGATIVE   Bilirubin Urine NEGATIVE NEGATIVE   Ketones, ur NEGATIVE NEGATIVE mg/dL   Protein, ur NEGATIVE NEGATIVE mg/dL   Nitrite NEGATIVE NEGATIVE   Leukocytes,Ua NEGATIVE NEGATIVE   RBC / HPF 0-5 0 - 5 RBC/hpf   WBC, UA 0-5 0 - 5 WBC/hpf   Bacteria, UA NONE SEEN NONE SEEN   Squamous Epithelial / HPF 0-5 0 - 5 /HPF   Mucus PRESENT     Comment: Performed at [facility], [address] [place], [place] [address]  Basic metabolic panel     Status: Abnormal   Collection Time: 04/17/24  2:09 PM  Result Value Ref Range   Sodium 135 135 - 145 mmol/L   Potassium 4.3 3.5 - 5.1 mmol/L   Chloride 99 98 - 111 mmol/L   CO2 29 22 - 32 mmol/L   Glucose, Bld 99 70 - 99 mg/dL    Comment: Glucose reference range applies only to samples taken after fasting for at least 8 hours.   BUN 31 (H) 8 - 23 mg/dL   Creatinine, Ser 8.94 0.61 - 1.24 mg/dL   Calcium  9.0 8.9 - 10.3 mg/dL   GFR, Estimated >39 >39 mL/min     Comment: (NOTE) Calculated using the CKD-EPI Creatinine Equation (2021)    Anion gap 7 5 - 15    Comment: Performed at [facility], [address], [place], [place] [address]  CBC     Status: Abnormal   Collection Time: 04/17/24  2:09 PM  Result Value Ref Range   WBC 12.2 (H) 4.0 - 10.5 K/uL   RBC 3.86 (L) 4.22 - 5.81 MIL/uL   Hemoglobin 10.7 (L) 13.0 - 17.0 g/dL   HCT 65.3 (L) 60.9 - 47.9 %   MCV 89.6 80.0 - 100.0 fL   MCH 27.7 26.0 - 34.0 pg   MCHC 30.9 30.0 - 36.0 g/dL   RDW 85.5 88.4 - 84.4 %   Platelets 214 150 - 400 K/uL   nRBC 0.0 0.0 - 0.2 %    Comment: Performed at [facility], [address] [place], [place] [address]  BNP (Order if Patient has history of Heart Failure)     Status: Abnormal   Collection Time: 04/17/24  2:09 PM  Result Value Ref Range  B Natriuretic Peptide 331.5 (H) 0.0 - 100.0 pg/mL    Comment: Performed at [facility], [address] [place], [place] [address]  I-Stat Lactic Acid     Status: Abnormal   Collection Time: 04/17/24  3:02 PM  Result Value Ref Range   Lactic Acid, Venous 0.4 (L) 0.5 - 1.9 mmol/L    Chemistries  Recent Labs  Lab 04/14/24 0829 04/17/24 1409  NA 142 135  K 5.0 4.3  CL 102 99  CO2 25 29  GLUCOSE 104* 99  BUN 27 31*  CREATININE 1.13 1.05  CALCIUM  9.3 9.0   ------------------------------------------------------------------------------------------------------------------  ------------------------------------------------------------------------------------------------------------------ GFR: Estimated Creatinine Clearance: 74.9 mL/min (by C-G formula based on SCr of 1.05 mg/dL). Liver Function Tests: No results for input(s): AST, ALT, ALKPHOS, BILITOT, PROT, ALBUMIN in the last 168 hours. No results for input(s): LIPASE, AMYLASE in the last 168 hours. No results for input(s): AMMONIA in the last 168 hours. Coagulation Profile: No  results for input(s): INR, PROTIME in the last 168 hours. Cardiac Enzymes: No results for input(s): CKTOTAL, CKMB, CKMBINDEX, TROPONINI in the last 168 hours. BNP (last 3 results) Recent Labs    05/18/23 1548 05/25/23 0800  PROBNP 1,876* 2,365*   HbA1C: No results for input(s): HGBA1C in the last 72 hours. CBG: No results for input(s): GLUCAP in the last 168 hours. Lipid Profile: No results for input(s): CHOL, HDL, LDLCALC, TRIG, CHOLHDL, LDLDIRECT in the last 72 hours. Thyroid  Function Tests: No results for input(s): TSH, T4TOTAL, FREET4, T3FREE, THYROIDAB in the last 72 hours. Anemia Panel: No results for input(s): VITAMINB12, FOLATE, FERRITIN, TIBC, IRON , RETICCTPCT in the last 72 hours.  --------------------------------------------------------------------------------------------------------------- Urine analysis:    Component Value Date/Time   COLORURINE YELLOW 04/17/2024 1326   APPEARANCEUR CLEAR 04/17/2024 1326   LABSPEC 1.012 04/17/2024 1326   PHURINE 5.0 04/17/2024 1326   GLUCOSEU NEGATIVE 04/17/2024 1326   GLUCOSEU NEGATIVE 12/03/2011 1024   HGBUR SMALL (A) 04/17/2024 1326   BILIRUBINUR NEGATIVE 04/17/2024 1326   KETONESUR NEGATIVE 04/17/2024 1326   PROTEINUR NEGATIVE 04/17/2024 1326   UROBILINOGEN 0.2 12/03/2011 1024   NITRITE NEGATIVE 04/17/2024 1326   LEUKOCYTESUR NEGATIVE 04/17/2024 1326      Imaging Results:    DG Chest 2 View Result Date: 04/17/2024 CLINICAL DATA:  Short of breath EXAM: CHEST - 2 VIEW COMPARISON:  05/26/2019 FINDINGS: Port in the anterior chest wall with tip in distal SVC. Chronic loculated fluid at the RIGHT lung apex. LEFT basilar atelectasis similar prior. No pneumothorax. No change from comparison exam. IMPRESSION: 1. No acute cardiopulmonary process. 2. Chronic loculated fluid at the RIGHT lung apex. 3. LEFT basilar atelectasis. Electronically Signed   By: Jackquline Boxer M.D.   On:  04/17/2024 14:08    My personal review of EKG: Rhythm atrial fibrillation   Assessment & Plan:    Principal Problem:   COPD exacerbation (HCC)   Acute on chronic hypoxemic respiratory failure-presented with worsening shortness of breath, has a history of COPD and recently treated for pneumonia.  Likely from underlying COPD exacerbation and possible pneumonia.  Patient has significant rhonchi and unable to cough up phlegm.  Will start hypertonic saline nebulizer twice a day for 3 days, Mucinex  600 mg p.o. twice daily, Solu-Medrol  40 mg IV twice daily for 1 day and then prednisone  40 mg daily, DuoNeb nebulizers every 6 hours as needed.  Started on vancomycin  and cefepime .  Will check procalcitonin.  Lactic acid 0.4.  Follow blood culture results.  Will obtain sputum culture, strep pneumo urinary antigen, Legionella antigen.   ?  Lung abscess-chest x-ray showed chronic loculated fluid at the right lung apex.  He does have history of lung cancer, status post radiation treatment in 2011.  Will obtain CT chest to rule out lung abscess/empyema.  Atrial fibrillation-patient has history of atrial fibrillation, currently on Toprol -XL for rate control and Eliquis  for anticoagulation.  Will monitor on telemetry.  Chronic diastolic heart failure-last EF 65% as per echo from 2024.  BNP 331, has been chronically elevated around 300s.  Patient takes Lasix  20 mg daily at home.  Will continue with Lasix .  Hypothyroidism-continue Synthroid   BPH-continue finasteride   GERD-continue Protonix    DVT Prophylaxis-   apixaban   AM Labs Ordered, also please review Full Orders  Family Communication: Admission, patients condition and plan of care including tests being ordered have been discussed with the patient  who indicate understanding and agree with the plan and Code Status.  Code Status: Full code  Admission status: Inpatient :The appropriate admission status for this patient is INPATIENT. Inpatient status  is judged to be reasonable and necessary in order to provide the required intensity of service to ensure the patient's safety. The patient's presenting symptoms, physical exam findings, and initial radiographic and laboratory data in the context of their chronic comorbidities is felt to place them at high risk for further clinical deterioration. Furthermore, it is not anticipated that the patient will be medically stable for discharge from the hospital within 2 midnights of admission. The following factors support the admission status of inpatient.          * I certify that at the point of admission it is my clinical judgment that the patient will require inpatient hospital care spanning beyond 2 midnights from the point of admission due to high intensity of service, high risk for further deterioration and high frequency of surveillance required.*  Time spent in minutes : 60 minutes   Kaedan Richert S Jolon Degante M.D

## 2024-04-17 NOTE — ED Triage Notes (Signed)
 Pt has c/o increased SHOB and fever since this AM. Pt is normall on 2 lpm O2, but had to go up to 4 lpm, and O2 was still reading in the 80s.

## 2024-04-17 NOTE — ED Notes (Signed)
 Patient transported to x-ray. ?

## 2024-04-17 NOTE — ED Provider Triage Note (Signed)
 Emergency Medicine Provider Triage Evaluation Note  [name] , a 73 y.o. male  was evaluated in triage.  Pt complains of increased dyspnea since this morning.  At baseline he is at 2 L/min by nasal cannula for oxygen  secondary to COPD and lung cancer.  Further has previous medical diagnosis of diastolic heart failure.  Previously on furosemide  for diuresis, on Friday of last week was also started on hydrochlorothiazide  secondary to hyperkalemia noted at his primary care.  The very next day he began to noticed increased dyspnea as well as generalized weakness and dizziness.  Endorses urinating every 20 to 30 minutes..  Review of Systems  Positive: As above Negative:   Physical Exam  BP 127/61 (BP Location: Right Arm)   Pulse 91   Temp (!) 100.7 F (38.2 C) (Oral)   Resp 19   SpO2 92%  Gen:   Awake, no distress   Resp:  Normal effort, expiratory wheezes noted in all fields. MSK:   Moves extremities without difficulty Other:  Poor skin turgor and dry oral mucosa are noted  Medical Decision Making  Medically screening exam initiated at 1:50 PM.  Appropriate orders placed.  [name] was informed that the remainder of the evaluation will be completed by another provider, this initial triage assessment does not replace that evaluation, and the importance of remaining in the ED until their evaluation is complete.  Initial orders placed by nursing staff, noted to be initially febrile was provided with 650 mg of acetaminophen .   Myriam Dorn BROCKS, PA 04/17/24 1353

## 2024-04-17 NOTE — Progress Notes (Signed)
 Pharmacy Antibiotic Note  [name] is a 73 y.o. male admitted on 04/17/2024 with pneumonia.  Pharmacy has been consulted for vanc/cefepime  dosing.  Plan: Vancomycin  2g IV x 1 already given. Start vanc 1500mg  IV q24 thereafter - goal AUC 400-550 Cefepime  2g IV q8 F/up MRSA PCR results    Temp (24hrs), Avg:99.8 F (37.7 C), Min:98.8 F (37.1 C), Max:100.7 F (38.2 C)  Recent Labs  Lab 04/14/24 0829 04/17/24 1409 04/17/24 1502  WBC  --  12.2*  --   CREATININE 1.13 1.05  --   LATICACIDVEN  --   --  0.4*    Estimated Creatinine Clearance: 74.9 mL/min (by C-G formula based on SCr of 1.05 mg/dL).    Allergies  Allergen Reactions   Lipitor [Atorvastatin ] Other (See Comments)    Myalgias  Weakness  Fatigue    Omnipaque  [Iohexol ] Itching and Other (See Comments)    When asked about contrast media today, pt stated that last time he had the contrast that he was red all over and itchy from head to toe.       Thank you for allowing pharmacy to be a part of this patient's care.  Britta Eva Na 04/17/2024 5:25 PM

## 2024-04-17 NOTE — Telephone Encounter (Signed)
 Called pt to discuss BMP being normal. Pt said he is having trouble with hydrochlorothiazide . He said it dried it too. Urinated every 30-40 minutes all day long. Constantly drank water. Dried out nose and mouth. Could not talk much. Stopped it Saturday. Felt better yesterday but feeling the same today. O2 dropped to 88%. O2 yesterday was 94 % but dropped in the evening to 88%. Today it is at 90%-92%.

## 2024-04-17 NOTE — ED Provider Notes (Signed)
 [place] [facility]PARTMENT AT [facility] Provider Note   CSN: [id number] Arrival date & time: 04/17/24  1310     Patient presents with: Shortness of Breath and Fever   [name] is a 73 y.o. male.    Shortness of Breath Associated symptoms: fever   Fever   Patient presents with fever, cough, worsening shortness of breath, chills.  Patient states that over the past day or 2, has been having chills.  Started with fever today.  Have not having increasing shortness of breath over the past day as well.  Wears 2 L of nasal cannula at home at baseline.  Found that his O2 saturation was in his 80s today.  Subsequently had a bump of his nasal cannula to 4 L to stay above 88.  Patient states with his coughing he has had chills as well as fevers.  No pleuritic chest pain.  No hemoptysis.  Currently no chest pain at all.  He does feels very weak and tired.  No abdominal pain.  No nausea vomit diarrhea.  No dysuria.  No skin breakdown . No sick contacts   Previous medical history reviewed : Patient recently discharged on July 13 due to sepsis due to pneumonia.  MRSA nares negative.  Respiratory virus panel negative.  Sputum culture grew acinetobacter.  He was discharged on ciprofloxacin .      Prior to Admission medications   Medication Sig Start Date End Date Taking? Authorizing Provider  albuterol  (PROVENTIL ) (2.5 MG/3ML) 0.083% nebulizer solution Take 2.5 mg by nebulization in the morning and at bedtime. 03/12/24   [provider]  allopurinol  (ZYLOPRIM ) 300 MG tablet Take 300 mg by mouth daily. 01/15/17   [provider]  amLODipine  (NORVASC ) 5 MG tablet Take 5 mg by mouth daily. 01/21/20   [provider]  apixaban  (ELIQUIS ) 5 MG TABS tablet Take 1 tablet by mouth twice daily 01/24/24   Revankar, Rajan R, MD  betamethasone , augmented, (DIPROLENE ) 0.05 % lotion Apply 1 Application topically daily as needed (dry skin on scalp).    [provider]  Buprenorphine HCl-Naloxone HCl 8-2 MG FILM Place 0.5-1 Film under the tongue 2 (two) times daily as needed (back pain). 03/24/23   [provider]  ciclopirox  (PENLAC ) 8 % solution Apply topically at bedtime. Apply a thin coat over nail. Apply daily over previous coat. Remove weekly with nail polish remover. 12/23/23   McCaughan, Dia D, DPM  ezetimibe  (ZETIA ) 10 MG tablet Take 10 mg by mouth daily. 02/09/23   [provider]  Ferrous Sulfate  (IRON ) 325 (65 Fe) MG TABS Take 1 tablet (325 mg total) by mouth daily. Patient not taking: Reported on 04/05/2024 04/02/24   Franchot Novel, MD  finasteride  (PROSCAR ) 5 MG tablet Take 5 mg by mouth daily. 03/13/19   [provider]  furosemide  (LASIX ) 20 MG tablet Take 1 tablet by mouth once daily 03/13/24   Anner Alm ORN, MD  hydrochlorothiazide  (HYDRODIURIL ) 12.5 MG tablet Take 1 tablet (12.5 mg total) by mouth daily. 04/12/24   Dottie Norleen PHEBE PONCE, MD  ipratropium-albuterol  (DUONEB) 0.5-2.5 (3) MG/3ML SOLN Take 3 mLs by nebulization every 6 (six) hours as needed. Patient taking differently: Take 3 mLs by nebulization in the morning and at bedtime. 05/10/23   Drusilla Sabas RAMAN, MD  levothyroxine  (SYNTHROID ) 175 MCG tablet Take 175 mcg by mouth daily. 10/29/22   [provider]  LINZESS  290 MCG CAPS capsule Take 1 capsule (290 mcg total)  by mouth daily before breakfast. 02/21/24   [name], Jacob T, PA-C  lisinopril  (PRINIVIL ,ZESTRIL ) 10 MG tablet Take 1 tablet (10 mg total) by mouth daily. 12/29/11   Nahser, Aleene PARAS, MD  Magnesium  Chloride 64 MG TABS Take 64 mg by mouth daily in the afternoon. 05/19/23   Carlin Delon BROCKS, NP  metoprolol  succinate (TOPROL  XL) 25 MG 24 hr tablet Take 1 tablet (25 mg total) by mouth daily. 02/08/23   Revankar, Jennifer SAUNDERS, MD  omeprazole  (PRILOSEC) 20 MG capsule Take 1 capsule (20 mg total) by mouth 2 (two) times daily. Patient taking differently: Take 20 mg by mouth daily. 10/13/19   Shelah Lamar RAMAN, MD   ondansetron  (ZOFRAN -ODT) 4 MG disintegrating tablet Take 1 tablet (4 mg total) by mouth every 8 (eight) hours as needed. Patient taking differently: Take 4 mg by mouth daily as needed for nausea or vomiting. 05/17/23   Billy Asberry FALCON, PA-C  PROAIR  HFA 108 (90 Base) MCG/ACT inhaler Inhale 2 puffs into the lungs every 6 (six) hours as needed for wheezing or shortness of breath. Patient taking differently: Inhale 2 puffs into the lungs 3 (three) times daily as needed for wheezing or shortness of breath. 10/02/17   Jerri Keys, MD  TRELEGY ELLIPTA 100-62.5-25 MCG/INH AEPB Inhale 1 puff into the lungs daily. 05/08/19   [provider]  zolpidem  (AMBIEN ) 10 MG tablet Take 10 mg by mouth at bedtime as needed for sleep. 03/24/23   [provider]    Allergies: Lipitor [atorvastatin ] and Omnipaque  [iohexol ]    Review of Systems  Constitutional:  Positive for fever.  Respiratory:  Positive for shortness of breath.     Updated Vital Signs BP 120/63 (BP Location: Right Arm)   Pulse 77   Temp 98.8 F (37.1 C) (Oral)   Resp 19   SpO2 96%   Physical Exam Vitals and nursing note reviewed.  Constitutional:      General: He is not in acute distress.    Appearance: He is well-developed.  HENT:     Head: Normocephalic and atraumatic.  Eyes:     Conjunctiva/sclera: Conjunctivae normal.  Cardiovascular:     Rate and Rhythm: Normal rate and regular rhythm.     Heart sounds: No murmur heard. Pulmonary:     Effort: Pulmonary effort is normal. No respiratory distress.     Breath sounds: Wheezing present.  Abdominal:     Palpations: Abdomen is soft.     Tenderness: There is no abdominal tenderness.  Musculoskeletal:        General: No swelling.     Cervical back: Neck supple.  Skin:    General: Skin is warm and dry.     Capillary Refill: Capillary refill takes less than 2 seconds.  Neurological:     Mental Status: He is alert.  Psychiatric:        Mood and Affect: Mood normal.      (all labs ordered are listed, but only abnormal results are displayed) Labs Reviewed  BASIC METABOLIC PANEL WITH GFR - Abnormal; Notable for the following components:      Result Value   BUN 31 (*)    All other components within normal limits  CBC - Abnormal; Notable for the following components:   WBC 12.2 (*)    RBC 3.86 (*)    Hemoglobin 10.7 (*)    HCT 34.6 (*)    All other components within normal limits  BRAIN NATRIURETIC PEPTIDE - Abnormal; Notable for  the following components:   B Natriuretic Peptide 331.5 (*)    All other components within normal limits  URINALYSIS, ROUTINE W REFLEX MICROSCOPIC - Abnormal; Notable for the following components:   Hgb urine dipstick SMALL (*)    All other components within normal limits  I-STAT CG4 LACTIC ACID, ED - Abnormal; Notable for the following components:   Lactic Acid, Venous 0.4 (*)    All other components within normal limits  RESP PANEL BY RT-PCR (RSV, FLU A&B, COVID)  RVPGX2  CULTURE, BLOOD (ROUTINE X 2)  CULTURE, BLOOD (ROUTINE X 2)  I-STAT CG4 LACTIC ACID, ED    EKG: EKG Interpretation Date/Time:  Monday April 17 2024 13:22:44 EDT Ventricular Rate:  93 PR Interval:    QRS Duration:  99 QT Interval:  362 QTC Calculation: 443 R Axis:   105  Text Interpretation: Atrial fibrillation Right axis deviation Abnormal R-wave progression, late transition Confirmed by Simon Rea [phone]) on 04/17/2024 4:12:29 PM  Radiology: [facility] Chest 2 View Result Date: 04/17/2024 CLINICAL DATA:  Short of breath EXAM: CHEST - 2 VIEW COMPARISON:  05/26/2019 FINDINGS: Port in the anterior chest wall with tip in distal SVC. Chronic loculated fluid at the RIGHT lung apex. LEFT basilar atelectasis similar prior. No pneumothorax. No change from comparison exam. IMPRESSION: 1. No acute cardiopulmonary process. 2. Chronic loculated fluid at the RIGHT lung apex. 3. LEFT basilar atelectasis. Electronically Signed   By: Jackquline Boxer M.D.   On:  04/17/2024 14:08     Procedures   Medications Ordered in the ED  ceFEPIme  (MAXIPIME ) 2 g in sodium chloride  0.9 % 100 mL IVPB (2 g Intravenous New Bag/Given 04/17/24 1533)  vancomycin  (VANCOREADY) IVPB 2000 mg/400 mL (2,000 mg Intravenous New Bag/Given 04/17/24 1533)  sodium chloride  HYPERTONIC 3 % nebulizer solution 4 mL (has no administration in time range)  guaiFENesin  (MUCINEX ) 12 hr tablet 600 mg (has no administration in time range)  acetaminophen  (TYLENOL ) tablet 650 mg (650 mg Oral Given 04/17/24 1336)  sodium chloride  0.9 % bolus 500 mL (500 mLs Intravenous New Bag/Given 04/17/24 1534)                                    Medical Decision Making Amount and/or Complexity of Data Reviewed Labs: ordered. Radiology: ordered.  Risk OTC drugs. Prescription drug management. Decision regarding hospitalization.   Patient presents with fever, cough, worsening shortness of breath, chills.  Patient states that over the past day or 2, has been having chills.  Started with fever today.  Have not having increasing shortness of breath over the past day as well.  Wears 2 L of nasal cannula at home at baseline.  Found that his O2 saturation was in his 80s today.  Subsequently had a bump of his nasal cannula to 4 L to stay above 88.  Patient states with his coughing he has had chills as well as fevers.  No pleuritic chest pain.  No hemoptysis.  Currently no chest pain at all.  He does feels very weak and tired.  No abdominal pain.  No nausea vomit diarrhea.  No dysuria.  No skin breakdown . No sick contacts    Previous medical history reviewed : Patient recently discharged on July 13 due to sepsis due to pneumonia.  MRSA nares negative.  Respiratory virus panel negative.  Sputum culture grew acinetobacter.  He was discharged on ciprofloxacin .   On  exam, patient O2 saturation 92% on 4 L nasal cannula.  Baseline 2 L nasal cannula.  Febrile 38.2 C oral.   Did obtain chest x-ray.  He does have a  chronic loculated fluid at the right lung apex.  Given this as well as his fever, recent admission, will cover for hospital-acquired pneumonia with vancomycin  as well as cefepime .  Patient has a soft benign abdomen.  No rebound guarding or tenderness tach appreciated.  No concerns for intra-abdominal pathology.   Obtain CT scan without contrast of the patient's chest to evaluate for any kind of pneumonia that was not seen on chest x-ray today.   UA unremarkable.   COVID RSV and flu negative.   Think the biggest concern for source of infection given the fever as well as leukocytosis would be recurrent pneumonia.  Question whether or not this could be hospital-acquired pneumonia.  Blood cultures ordered.  Gentle hydration.  No elevated lactic acid.  Start patient on 500 cc of fluid.  No indication for aggressive fluid resuscitation given normal hemodynamics.   Patient will be admitted to medicine for further care.      Final diagnoses:  Pneumonia due to infectious organism, unspecified laterality, unspecified part of lung  Shortness of breath    ED Discharge Orders     None          Simon Lavonia SAILOR, MD 04/17/24 [id number]

## 2024-04-17 NOTE — Progress Notes (Signed)
 ED Pharmacy Antibiotic Sign Off An antibiotic consult was received from an ED provider for vancomycin  per pharmacy dosing for sepsis. A chart review was completed to assess appropriateness.   The following one time order(s) were placed:  Vancomycin  2g  Further antibiotic and/or antibiotic pharmacy consults should be ordered by the admitting provider if indicated.   Thank you for allowing pharmacy to be a part of this patient's care.   Britta Eva Na, [facility]  Clinical Pharmacist 04/17/24 2:47 PM

## 2024-04-18 ENCOUNTER — Encounter: Payer: Self-pay | Admitting: Internal Medicine

## 2024-04-18 ENCOUNTER — Inpatient Hospital Stay (HOSPITAL_COMMUNITY)

## 2024-04-18 ENCOUNTER — Other Ambulatory Visit: Payer: Self-pay | Admitting: Cardiology

## 2024-04-18 DIAGNOSIS — R0603 Acute respiratory distress: Secondary | ICD-10-CM | POA: Diagnosis not present

## 2024-04-18 DIAGNOSIS — J189 Pneumonia, unspecified organism: Secondary | ICD-10-CM | POA: Diagnosis not present

## 2024-04-18 DIAGNOSIS — J9621 Acute and chronic respiratory failure with hypoxia: Secondary | ICD-10-CM | POA: Diagnosis not present

## 2024-04-18 DIAGNOSIS — R0602 Shortness of breath: Secondary | ICD-10-CM | POA: Diagnosis not present

## 2024-04-18 DIAGNOSIS — J441 Chronic obstructive pulmonary disease with (acute) exacerbation: Secondary | ICD-10-CM | POA: Diagnosis not present

## 2024-04-18 LAB — RESPIRATORY PANEL BY PCR

## 2024-04-18 LAB — COMPREHENSIVE METABOLIC PANEL WITH GFR
ALT: 15 U/L (ref 0–44)
AST: 19 U/L (ref 15–41)
Albumin: 3.2 g/dL — ABNORMAL LOW (ref 3.5–5.0)
Alkaline Phosphatase: 89 U/L (ref 38–126)
Anion gap: 10 (ref 5–15)
BUN: 31 mg/dL — ABNORMAL HIGH (ref 8–23)
CO2: 26 mmol/L (ref 22–32)
Calcium: 8.7 mg/dL — ABNORMAL LOW (ref 8.9–10.3)
Chloride: 99 mmol/L (ref 98–111)
Creatinine, Ser: 1.22 mg/dL (ref 0.61–1.24)
GFR, Estimated: >60 mL/min
Glucose, Bld: 131 mg/dL — ABNORMAL HIGH (ref 70–99)
Potassium: 4.2 mmol/L (ref 3.5–5.1)
Sodium: 135 mmol/L (ref 135–145)
Total Bilirubin: 1.1 mg/dL (ref 0.0–1.2)
Total Protein: 6.3 g/dL — ABNORMAL LOW (ref 6.5–8.1)

## 2024-04-18 LAB — CBC
HCT: 32.9 % — ABNORMAL LOW (ref 39.0–52.0)
Hemoglobin: 10.3 g/dL — ABNORMAL LOW (ref 13.0–17.0)
MCH: 28.5 pg (ref 26.0–34.0)
MCHC: 31.3 g/dL (ref 30.0–36.0)
MCV: 91.1 fL (ref 80.0–100.0)
Platelets: 197 K/uL (ref 150–400)
RBC: 3.61 MIL/uL — ABNORMAL LOW (ref 4.22–5.81)
RDW: 14.3 % (ref 11.5–15.5)
WBC: 11.2 K/uL — ABNORMAL HIGH (ref 4.0–10.5)
nRBC: 0 % (ref 0.0–0.2)

## 2024-04-18 LAB — ECHOCARDIOGRAM COMPLETE
Area-P 1/2: 3.99 cm2
Height: 72 in
S' Lateral: 3.1 cm
Weight: 3232 [oz_av]

## 2024-04-18 LAB — MRSA NEXT GEN BY PCR, NASAL: MRSA by PCR Next Gen: NOT DETECTED

## 2024-04-18 LAB — STREP PNEUMONIAE URINARY ANTIGEN: Strep Pneumo Urinary Antigen: NEGATIVE

## 2024-04-18 LAB — SEDIMENTATION RATE: Sed Rate: 62 mm/h — ABNORMAL HIGH (ref 0–16)

## 2024-04-18 MED ORDER — PERFLUTREN LIPID MICROSPHERE
1.0000 mL | INTRAVENOUS | Status: AC | PRN
  Administered 2024-04-18: 4 mL via INTRAVENOUS

## 2024-04-18 MED ORDER — IPRATROPIUM-ALBUTEROL 0.5-2.5 (3) MG/3ML IN SOLN
3.0000 mL | Freq: Four times a day (QID) | RESPIRATORY_TRACT | Status: DC
  Administered 2024-04-18 – 2024-04-19 (×3): 3 mL via RESPIRATORY_TRACT
  Filled 2024-04-18 (×3): qty 3

## 2024-04-18 MED ORDER — SODIUM CHLORIDE 0.9% FLUSH: 10.0000 mL | INTRAVENOUS | Status: DC | PRN

## 2024-04-18 MED ORDER — ACETYLCYSTEINE 20 % IN SOLN
3.0000 mL | Freq: Four times a day (QID) | RESPIRATORY_TRACT | Status: DC
  Administered 2024-04-18 – 2024-04-21 (×12): 3 mL via RESPIRATORY_TRACT
  Filled 2024-04-18 (×15): qty 4

## 2024-04-18 MED ORDER — APIXABAN 5 MG PO TABS
5.0000 mg | ORAL_TABLET | Freq: Two times a day (BID) | ORAL | Status: DC
  Administered 2024-04-18 – 2024-04-21 (×7): 5 mg via ORAL
  Filled 2024-04-18 (×7): qty 1

## 2024-04-18 MED ORDER — CHLORHEXIDINE GLUCONATE CLOTH 2 % EX PADS
6.0000 | MEDICATED_PAD | Freq: Every day | CUTANEOUS | Status: DC
  Administered 2024-04-18 – 2024-04-21 (×4): 6 via TOPICAL

## 2024-04-18 NOTE — Plan of Care (Signed)
   Problem: Education: Goal: Knowledge of General Education information will improve Description Including pain rating scale, medication(s)/side effects and non-pharmacologic comfort measures Outcome: Progressing

## 2024-04-18 NOTE — Consult Note (Signed)
 NAME:  [name], MRN:  [id number], DOB:  February 24, 1951, LOS: 1 ADMISSION DATE:  04/17/2024, CONSULTATION DATE:  04/18/24 REFERRING MD:  Dr DRUSILLA, CHIEF COMPLAINT:  Acute on chronic respiratory fialure    History of Present Illness:  73 year old patient with severe COPD.  Also atrial fibrillation on Eliquis .  Of note and for many years since her original start of practice in 2008 but he reestablished in October 2024.  He has a remote history of non-Hodgkin's lymphoma.  His oncology history is detailed below and is on observation therapy currently by Dr. Sherrod.  On his most recent CT chest May 2025 prior to current hospitalizations he had small 4 mm nodule on the left lower lobe.  1 year follow-up was recommended.  He saw Dr. Geronimo for pulmonary office follow-up in March 21 2024 and was in stable health apart from a history of frequent COPD exacerbations for which biologic therapy was being entertained [he did not get his CBC done].  At baseline uses 2 L nasal cannula at night  He then was admitted 03/30/2024 - 04/02/2024 with worsening shortness of breath following working in his garage all day.  Wife also reported that prior to visit he was using a lot of weed killer and getting exposed to a lot of dust in the garage.  He was hypoxemic to the 80s streaky hemoptysis.  Treated with antibiotics and prednisone  taper and discharged.  Multiplex PCR panel negative for respiratory viruses. His CT chest this asmit showed:  Interim since 2 months ago. development of heterogeneous airspace disease in the right middle and left greater than right lower lobes, suspicious for multifocal pneumonia. Trace left-sided pleural effusio  Then readmitted 04/17/2024 with 1 day history of fever and feeling dry and difficulty coughing up sputum.  Started on vancomycin  and Zosyn . CT chest this admit: C. Extensive airspace disease with air bronchograms in the left lower lobe with less pronounced nodular airspace disease in the  left upper lobe and right lower lobe. Findings compatible with multifocal pneumonia. He is on Vanc and Cefepime .    On 7/29 - [facility]  reports afebrile and normal procal but congested cough.SABRASABRAPt continues to produce purulent (pink to milky creamy pus-like) sputum. And ccm consulted.  PAteient repots that between first and 2nd admit he was given new hydrochlorothiazide  but it made him feel dehydrated.  And this then made very difficult for him to be no sputum and then made him hypoxemic.  He denies any dysphagia.  Nursing reports good swallowing at the bedside.  CCM comsilteed    Past Medical History:  1) stage IA (T1c, N0, M0) non-small cell lung cancer, squamous cell carcinoma diagnosed in January 2021 2) Stage III large B-cell non-Hodgkin lymphoma diagnosed in December 2016 and presented with extensive lymphadenopathy involving the neck, chest, abdomen as well as splenomegaly.    PRIOR THERAPY:  1) Systemic chemotherapy with the CHOP/Rituxan  every 3 weeks with Neulasta  support. Status post 6 cycles. 2) SBRT to the right upper lobe squamous cell carcinoma under the care of Dr. Shannon in February 202  has a past medical history of Abnormal finding on GI tract imaging (09/07/2023), Acute diastolic CHF (congestive heart failure) (HCC) (05/07/2023), Acute on chronic diastolic CHF (congestive heart failure) (HCC) (05/06/2023), Acute on chronic respiratory failure with hypoxia (HCC) (05/06/2023), Aortic atherosclerosis (HCC) (01/26/2023), Arthritis, Atrial fibrillation (HCC) (10/26/2011), Benign neoplasm of colon (09/07/2023), BPH (benign prostatic hyperplasia) (2011), BRONCHOPNEUMONIA ORGANISM UNSPECIFIED (06/12/2010), Carpal tunnel syndrome, right upper limb (  11/03/2022), Chronic respiratory failure with hypoxia (HCC) (08/27/2015), Congestive heart failure (HCC), COPD (chronic obstructive pulmonary disease) (HCC), COPD with acute exacerbation (HCC) (05/26/2023), Coronary artery calcification  (01/26/2023), Cubital tunnel syndrome on right (07/09/2023), Depression, DOE (dyspnea on exertion) (04/07/2023), Essential hypertension (06/05/2008), GERD (gastroesophageal reflux disease), Hypothyroidism (03/30/2012), Lesion of ulnar nerve, right upper limb (11/03/2022), Lymphadenopathy syndrome (09/12/2015), Mediastinal adenopathy (09/12/2015), NHL (non-Hodgkin's lymphoma) (HCC) (09/25/2015), Numbness of right hand (06/28/2022), Obesity, Open wound of right hand without foreign body (07/27/2021), Other dysphagia (11/11/2010), Polycythemia (01/26/2023), Port catheter in place (08/20/2016), Pulmonary hypertension (HCC), Pure hypercholesterolemia (12/03/2011), Recurrent pneumonia (02/10/2012), Routine general medical examination at a health care facility (12/03/2011), Squamous cell carcinoma lung (HCC) (09/26/2010), Status post bronchoscopy with biopsy, Stiffness of finger joint of right hand (09/24/2021), and Testicular swelling (09/12/2015).   reports that he quit smoking about 22 years ago. His smoking use included cigarettes. He started smoking about 65 years ago. He has a 129 pack-year smoking history. He has been exposed to tobacco smoke. He has never used smokeless tobacco.  Past Surgical History:  Procedure Laterality Date   APPENDECTOMY     BACK SURGERY     CARDIOVERSION  11/27/2011   Procedure: CARDIOVERSION;  Surgeon: Aleene JINNY Passe, MD;  Location: [facility] ENDOSCOPY;  Service: Cardiovascular;  Laterality: N/A;   COLONOSCOPY     COLONOSCOPY WITH PROPOFOL  N/A 09/07/2023   Procedure: COLONOSCOPY WITH PROPOFOL ;  Surgeon: Leigh Elspeth SQUIBB, MD;  Location: [facility] ENDOSCOPY;  Service: Gastroenterology;  Laterality: N/A;   FUDUCIAL PLACEMENT Right 10/13/2019   Procedure: Placement Of Fuducial;  Surgeon: Shelah Lamar RAMAN, MD;  Location: [facility] OR;  Service: Thoracic;  Laterality: Right;  Right Upper Lobe    LEFT HEART CATH AND CORONARY ANGIOGRAPHY N/A 04/12/2023   Procedure: LEFT HEART CATH AND CORONARY  ANGIOGRAPHY;  Surgeon: Anner Alm ORN, MD;  Location: [facility] INVASIVE CV LAB;  Service: Cardiovascular;  Laterality: N/A;   LUNG CANCER SURGERY  2011   POLYPECTOMY  09/07/2023   Procedure: POLYPECTOMY;  Surgeon: Leigh Elspeth SQUIBB, MD;  Location: [facility]OSCOPY;  Service: Gastroenterology;;   UPPER GI ENDOSCOPY     VIDEO BRONCHOSCOPY WITH ENDOBRONCHIAL NAVIGATION N/A 10/13/2019   Procedure: VIDEO BRONCHOSCOPY WITH ENDOBRONCHIAL NAVIGATION;  Surgeon: Shelah Lamar RAMAN, MD;  Location: [facility] OR;  Service: Thoracic;  Laterality: N/A;   VIDEO BRONCHOSCOPY WITH ENDOBRONCHIAL ULTRASOUND N/A 10/13/2019   Procedure: VIDEO BRONCHOSCOPY WITH ENDOBRONCHIAL ULTRASOUND;  Surgeon: Shelah Lamar RAMAN, MD;  Location: [facility] OR;  Service: Thoracic;  Laterality: N/A;    Allergies  Allergen Reactions   Lipitor [Atorvastatin ] Other (See Comments)    Myalgias  Weakness  Fatigue    Omnipaque  [Iohexol ] Itching and Other (See Comments)    When asked about contrast media today, pt stated that last time he had the contrast that he was red all over and itchy from head to toe.     Immunization History  Administered Date(s) Administered   Fluad Trivalent(High Dose 65+) 05/28/2023   Influenza Whole 06/21/2009, 08/30/2011   Influenza, High Dose Seasonal PF 06/10/2017, 07/27/2018, 06/22/2019   Influenza-Unspecified 09/16/2015, 05/13/2016   PNEUMOCOCCAL CONJUGATE-20 03/03/2021   Pneumococcal Conjugate-13 08/06/2017   Pneumococcal Polysaccharide-23 05/22/2008   Tdap 12/03/2011, 12/23/2021    Family History  Problem Relation Age of Onset   Heart disease Father    Cancer Neg Hx    Diabetes Neg Hx    Stroke Neg Hx    Hypertension Neg Hx    Kidney disease Neg Hx  Current Facility-Administered Medications:    0.9 %  sodium chloride  infusion, 250 mL, Intravenous, PRN, Drusilla, Sabas RAMAN, MD   allopurinol  (ZYLOPRIM ) tablet 300 mg, 300 mg, Oral, Daily, Drusilla, Sabas RAMAN, MD, 300 mg at 04/18/24 9144   amLODipine  (NORVASC ) tablet 5  mg, 5 mg, Oral, Daily, Drusilla, Sabas RAMAN, MD, 5 mg at 04/18/24 9143   apixaban  (ELIQUIS ) tablet 5 mg, 5 mg, Oral, BID, Kyere, Belinda K, NP, 5 mg at 04/18/24 9365   budesonide -glycopyrrolate -formoterol  (BREZTRI ) 160-9-4.8 MCG/ACT inhaler 2 puff, 2 puff, Inhalation, BID, Drusilla, Sabas RAMAN, MD, 2 puff at 04/18/24 0843   ceFEPIme  (MAXIPIME ) 2 g in sodium chloride  0.9 % 100 mL IVPB, 2 g, Intravenous, Q8H, Drusilla, Sabas RAMAN, MD, Last Rate: 200 mL/hr at 04/18/24 0841, 2 g at 04/18/24 9158   Chlorhexidine  Gluconate Cloth 2 % PADS 6 each, 6 each, Topical, Daily, Drusilla, Sabas RAMAN, MD   ezetimibe  (ZETIA ) tablet 10 mg, 10 mg, Oral, Daily, Drusilla, Gagan S, MD, 10 mg at 04/18/24 9142   ferrous sulfate  tablet 325 mg, 325 mg, Oral, Daily, Drusilla, Sabas RAMAN, MD, 325 mg at 04/18/24 9145   finasteride  (PROSCAR ) tablet 5 mg, 5 mg, Oral, Daily, Drusilla, Sabas RAMAN, MD, 5 mg at 04/18/24 9144   furosemide  (LASIX ) tablet 20 mg, 20 mg, Oral, Daily, Drusilla, Sabas RAMAN, MD, 20 mg at 04/18/24 0856   guaiFENesin  (MUCINEX ) 12 hr tablet 600 mg, 600 mg, Oral, BID, Drusilla, Gagan S, MD, 600 mg at 04/18/24 0854   ipratropium-albuterol  (DUONEB) 0.5-2.5 (3) MG/3ML nebulizer solution 3 mL, 3 mL, Nebulization, Q6H PRN, Drusilla, Sabas RAMAN, MD, 3 mL at 04/18/24 0450   levothyroxine  (SYNTHROID ) tablet 175 mcg, 175 mcg, Oral, Daily, Drusilla Sabas RAMAN, MD, 175 mcg at 04/18/24 9365   metoprolol  succinate (TOPROL -XL) 24 hr tablet 25 mg, 25 mg, Oral, Daily, Drusilla, Sabas RAMAN, MD, 25 mg at 04/18/24 0856   ondansetron  (ZOFRAN ) tablet 4 mg, 4 mg, Oral, Q6H PRN **OR** ondansetron  (ZOFRAN ) injection 4 mg, 4 mg, Intravenous, Q6H PRN, Drusilla, Sabas RAMAN, MD   pantoprazole  (PROTONIX ) EC tablet 40 mg, 40 mg, Oral, Daily, Drusilla, Sabas RAMAN, MD, 40 mg at 04/18/24 0855   [COMPLETED] methylPREDNISolone  sodium succinate  (SOLU-MEDROL ) 40 mg/mL injection 40 mg, 40 mg, Intravenous, Q12H, 40 mg at 04/18/24 0635 **FOLLOWED BY** predniSONE  (DELTASONE ) tablet 40 mg, 40 mg, Oral, Q breakfast, Lama, Sabas RAMAN, MD   sodium  chloride flush (NS) 0.9 % injection 10-40 mL, 10-40 mL, Intracatheter, PRN, Drusilla, Sabas RAMAN, MD   sodium chloride  flush (NS) 0.9 % injection 3 mL, 3 mL, Intravenous, Q12H, Drusilla, Gagan S, MD, 3 mL at 04/18/24 0859   sodium chloride  flush (NS) 0.9 % injection 3 mL, 3 mL, Intravenous, PRN, Drusilla, Sabas RAMAN, MD   sodium chloride  HYPERTONIC 3 % nebulizer solution 4 mL, 4 mL, Nebulization, BID, Drusilla, Gagan S, MD, 4 mL at 04/18/24 0843   vancomycin  (VANCOREADY) IVPB 1500 mg/300 mL, 1,500 mg, Intravenous, Q24H, Britta Eva HERO, [facility]     Significant Hospital Events:  04/17/2024 - admit  Interim History / Subjective:   04/18/2024 - seen in bed [address] [place]  Objective   Blood pressure 117/71, pulse 68, temperature 98.3 F (36.8 C), temperature source Oral, resp. rate 17, height 6' (1.829 m), weight 91.6 kg, SpO2 97%.        Intake/Output Summary (Last 24 hours) at 04/18/2024 1018 Last data filed at 04/18/2024 0901 Gross per 24 hour  Intake 1340 ml  Output 1900  ml  Net -560 ml   Filed Weights   04/17/24 2057  Weight: 91.6 kg    Examination: General: looks well HENT: on o2. No neck nodes Lungs: LL crackles. No distress Cardiovascular: normal heart sounds Abdomen: soft Extremities: inact Neuro: alert and oritnex 3 GU: Not examinex  Resolved Hospital Problem list   x  Assessment & Plan:   PULMONARY  A:  Acute on chronic hypoxemic respiratory failure -secondary to left lower lobe right middle lobe consolidation = recommends admission because of inability to bring up sputum following dehydration adverse reaction to hydrochlorothiazide .  First admission related to exposure to weed and dust and likely secondary bacterial process [procalcitonin 0.26 consistent with localized bacterial infection]  The second admission could be because of inability to bring up sputum.  His partial clearing of his right middle lobe infiltrates.  04/18/2024 -> currently on 2 L nasal cannula at rest.  P:    Continue oxygen  Continue bronchodilators Consider Solu-Medrol  Check respiratory virus panel Checked ESR extremely high consider Boop as a diagnosis and consider high-dose IV steroids No swallowing difficulties reported and therefore can hold off on swallow eval Advised to avoid dust exposures   Start Mucomyst  to help with sputum expectoration List hydrochlorothiazide  as allergy  Recheck ECHO   Best practice (daily eval):  PER [facility]       SIGNATURE    Dr. Dorethia Cave, M.D., F.C.C.P,  Pulmonary and Critical Care Medicine Staff Physician, [facility] Director - Interstitial Lung Disease  Program  Pulmonary Fibrosis [facility] at [place], [place], [address]  NPI Number:  NPI #[id number]  Pager: [phone], If no answer  -> Check AMION or Try [phone] Telephone (clinical office): [phone] Telephone (research): [phone]  10:18 AM 04/18/2024   04/18/2024 10:18 AM    LABS    PULMONARY No results for input(s): PHART, PCO2ART, PO2ART, HCO3, TCO2, O2SAT in the last 168 hours.  Invalid input(s): PCO2, PO2  CBC Recent Labs  Lab 04/17/24 1409 04/18/24 0401  HGB 10.7* 10.3*  HCT 34.6* 32.9*  WBC 12.2* 11.2*  PLT 214 197    COAGULATION No results for input(s): INR in the last 168 hours.  CARDIAC  No results for input(s): TROPONINI in the last 168 hours. No results for input(s): PROBNP in the last 168 hours.  CHEMISTRY Recent Labs  Lab 04/14/24 0829 04/17/24 1409 04/18/24 0401  NA 142 135 135  K 5.0 4.3 4.2  CL 102 99 99  CO2 25 29 26   GLUCOSE 104* 99 131*  BUN 27 31* 31*  CREATININE 1.13 1.05 1.22  CALCIUM  9.3 9.0 8.7*   Estimated Creatinine Clearance: 59.2 mL/min (by C-G formula based on SCr of 1.22 mg/dL).   LIVER Recent Labs  Lab 04/18/24 0401  AST 19  ALT 15  ALKPHOS 89  BILITOT 1.1  PROT 6.3*  ALBUMIN 3.2*     INFECTIOUS Recent Labs   Lab 04/17/24 1502 04/17/24 1738 04/17/24 2059  LATICACIDVEN 0.4* 0.8  --   PROCALCITON  --   --  0.45     ENDOCRINE CBG (last 3)  No results for input(s): GLUCAP in the last 72 hours.       IMAGING x48h  - image(s) personally visualized  -   highlighted in bold CT Chest Wo Contrast Result Date: 04/17/2024 CLINICAL DATA:  Shortness of breath, fever.  Evaluate for pneumonia. EXAM: CT CHEST WITHOUT CONTRAST TECHNIQUE: Multidetector  CT imaging of the chest was performed following the standard protocol without IV contrast. RADIATION DOSE REDUCTION: This exam was performed according to the departmental dose-optimization program which includes automated exposure control, adjustment of the mA and/or kV according to patient size and/or use of iterative reconstruction technique. COMPARISON:  Chest x-ray today.  Chest CT 03/30/2024. FINDINGS: Cardiovascular: Heart is normal size. Aorta is normal caliber. Coronary artery and aortic atherosclerosis. Mediastinum/Nodes: Mildly enlarged AP window and prevascular lymph nodes, stable, likely reactive. Trachea and esophagus are unremarkable. Thyroid  unremarkable. Lungs/Pleura: Chronic right apical density surrounding fiducial markers, presumably postradiation changes/scarring, unchanged. Extensive airspace disease with air bronchograms in the left lower lobe with less pronounced nodular airspace disease in the left upper lobe and right lower lobe. Findings compatible with multifocal pneumonia. No effusions. Upper Abdomen: No acute findings Musculoskeletal: Chest wall soft tissues are unremarkable. Right chest wall Port-A-Cath remains in place, unchanged. No acute bony abnormality. IMPRESSION: Dense consolidation in the left lower lobe with nodular airspace disease in the left upper lobe and right lower lobe. Findings compatible with multifocal pneumonia. Coronary artery disease. Aortic Atherosclerosis (ICD10-I70.0). Electronically Signed   By: Franky Crease M.D.    On: 04/17/2024 20:16   DG Chest 2 View Result Date: 04/17/2024 CLINICAL DATA:  Short of breath EXAM: CHEST - 2 VIEW COMPARISON:  05/26/2019 FINDINGS: Port in the anterior chest wall with tip in distal SVC. Chronic loculated fluid at the RIGHT lung apex. LEFT basilar atelectasis similar prior. No pneumothorax. No change from comparison exam. IMPRESSION: 1. No acute cardiopulmonary process. 2. Chronic loculated fluid at the RIGHT lung apex. 3. LEFT basilar atelectasis. Electronically Signed   By: Jackquline Boxer M.D.   On: 04/17/2024 14:08

## 2024-04-18 NOTE — Progress Notes (Signed)
Respiratory panel negative, droplet precautions discontinued.

## 2024-04-18 NOTE — Progress Notes (Signed)
 I am [facility]  PROGRESS NOTE  [name] FMW:[id number] DOB: [birth year] DOA: 04/17/2024 PCP: Iven Lang DASEN, PA-C   Brief HPI:   73 y.o. male, with a history of COPD on 2 L/min of oxygen  at home chronically, diastolic heart failure with last echo from May 2024 showed EF 60 to 65%, non-small cell lung cancer of right upper lobe, status post SBRT, non-Hodgkin lymphoma, hypothyroidism, atrial fibrillation on Eliquis , hypertension, hyperlipidemia who was recently discharged from the hospital on 04/05/2024 after treated for pneumonia.  Patient says that he was prescribed 2 diuretics both Lasix  and HCTZ..  Patient was not discharged on HCTZ on 04/05/2024, it was started on 04/12/2024.  As per pharmacy he was started on HCTZ by his PCP, Dr. Norleen Fearing in [place]  In the ED patient was found to have abnormal chest x-ray with possible loculated fluid in the right upper lobe. Also required full dose pulmonary of oxygen  via nasal cannula. CT chest without contrast was ordered. Patient started on vancomycin  and cefepime .    Assessment/Plan:    Acute on chronic hypoxemic respiratory failure-presented with worsening shortness of breath, has a history of COPD and recently treated for pneumonia.  Likely from underlying COPD exacerbation and possible pneumonia.  Patient has significant rhonchi and unable to cough up phlegm.  Improved after starting hypertonic saline nebulizer treatments, Mucinex  600 mg p.o. twice daily, Solu-Medrol  40 mg IV twice daily followed by prednisone  40 mg daily.  Procalcitonin 0.45.  Right upper and middle lobe consolidation-seen on CT chest, procalcitonin only 0.45.  He has history of malignancy in the past.  Will consult pulmonology for further evaluation and management of dense consolidation of right upper and middle lobe.   Atrial fibrillation-patient has history of atrial fibrillation, currently on Toprol -XL for rate control and Eliquis  for anticoagulation.  Will  monitor on telemetry.   Chronic diastolic heart failure-last EF 65% as per echo from 2024.  BNP 331, has been chronically elevated around 300s.  Patient takes Lasix  20 mg daily at home.  Will continue with Lasix .   Hypothyroidism-continue Synthroid    BPH-continue finasteride    GERD-continue Protonix    Medications     allopurinol   300 mg Oral Daily   amLODipine   5 mg Oral Daily   apixaban   5 mg Oral BID   budesonide -glycopyrrolate -formoterol   2 puff Inhalation BID   Chlorhexidine  Gluconate Cloth  6 each Topical Daily   ezetimibe   10 mg Oral Daily   ferrous sulfate   325 mg Oral Daily   finasteride   5 mg Oral Daily   furosemide   20 mg Oral Daily   guaiFENesin   600 mg Oral BID   levothyroxine   175 mcg Oral Daily   metoprolol  succinate  25 mg Oral Daily   pantoprazole   40 mg Oral Daily   predniSONE   40 mg Oral Q breakfast   sodium chloride  flush  3 mL Intravenous Q12H   sodium chloride  HYPERTONIC  4 mL Nebulization BID     Data Reviewed:   CBG:  No results for input(s): GLUCAP in the last 168 hours.  SpO2: 97 % O2 Flow Rate (L/min): 2 L/min    Vitals:   04/17/24 2057 04/18/24 0444 04/18/24 0453 04/18/24 0758  BP:  108/63  117/71  Pulse:  71  68  Resp:  17    Temp:  97.8 F (36.6 C)  98.3 F (36.8 C)  TempSrc:  Oral  Oral  SpO2:  96% 97% 97%  Weight: 91.6 kg  Height: 6' (1.829 m)         Data Reviewed:  Basic Metabolic Panel: Recent Labs  Lab 04/14/24 0829 04/17/24 1409 04/18/24 0401  NA 142 135 135  K 5.0 4.3 4.2  CL 102 99 99  CO2 25 29 26   GLUCOSE 104* 99 131*  BUN 27 31* 31*  CREATININE 1.13 1.05 1.22  CALCIUM  9.3 9.0 8.7*    CBC: Recent Labs  Lab 04/17/24 1409 04/18/24 0401  WBC 12.2* 11.2*  HGB 10.7* 10.3*  HCT 34.6* 32.9*  MCV 89.6 91.1  PLT 214 197    LFT Recent Labs  Lab 04/18/24 0401  AST 19  ALT 15  ALKPHOS 89  BILITOT 1.1  PROT 6.3*  ALBUMIN 3.2*     Antibiotics: Anti-infectives (From admission, onward)     Start     Dose/Rate Route Frequency Ordered Stop   04/18/24 1500  vancomycin  (VANCOREADY) IVPB 1500 mg/300 mL        1,500 mg 150 mL/hr over 120 Minutes Intravenous Every 24 hours 04/17/24 1727     04/17/24 1500  vancomycin  (VANCOREADY) IVPB 2000 mg/400 mL        2,000 mg 200 mL/hr over 120 Minutes Intravenous  Once 04/17/24 1447 04/17/24 1912   04/17/24 1445  ceFEPIme  (MAXIPIME ) 2 g in sodium chloride  0.9 % 100 mL IVPB        2 g 200 mL/hr over 30 Minutes Intravenous Every 8 hours 04/17/24 1430          DVT prophylaxis: Apixaban   Code Status: Full code  Family Communication: No family at bedside   CONSULTS PCCM   Subjective   Feels better this morning.  Has been able to cough up phlegm.   Objective    Physical Examination:   Physical Exam HENT:     Head: Normocephalic and atraumatic.  Cardiovascular:     Rate and Rhythm: Normal rate and regular rhythm.     Heart sounds: No murmur heard. Pulmonary:     Effort: Pulmonary effort is normal.     Breath sounds: Examination of the right-middle field reveals rhonchi. Examination of the left-middle field reveals rhonchi. Examination of the right-lower field reveals rhonchi. Examination of the left-lower field reveals rhonchi. Rhonchi present.  Abdominal:     Palpations: Abdomen is soft. There is no splenomegaly.     Tenderness: There is no abdominal tenderness.  Musculoskeletal:     Right lower leg: No edema.     Left lower leg: No edema.  Skin:    General: Skin is warm and dry.  Neurological:     General: No focal deficit present.     Mental Status: He is alert and oriented to person, place, and time.  Psychiatric:        Mood and Affect: Mood normal.        Behavior: Behavior normal.       Status is: Inpatient:             [name]   [facility] If 7PM-7AM, please contact night-coverage at [email or web address], Office  [phone]   04/18/2024, 8:23 AM  LOS: 1 day

## 2024-04-18 NOTE — Progress Notes (Signed)
 Echocardiogram 2D Echocardiogram has been performed.  Koleen KANDICE Popper, RDCS 04/18/2024, 2:02 PM

## 2024-04-18 NOTE — Progress Notes (Signed)
 BSE completed, full report to follow.    Patient known to SLP from prior admission in 2017 when he underwent modified barium swallow study that showed mild dysphagia.  At that time patient did have a trace silent aspiration when drinking thin liquids sequentially and minimal laryngeal penetration with single boluses.  Barium tablet taken with pudding appeared to halted at mid esophagus and did not clear with liquid swallows, radiologist was not present to confirm.  At this time patient reports his swallow to be functional and he denies this being the source of his admission.  He and his wife suspect current hospital admission is due to side effects of medication patient took causing severe dehydration without ability to clear secretions as well as weed killer and dust exposure.  He denies having overt symptoms of reflux nor dysphagia.    SLP reviewed prior MBS with patient and his wife Shasta.  Recommend if patient has recurrent hospital admissions for respiratory issues without clear source, consider esophagram with tablet given patient's history of chronic GERD.  He is currently taking a reflux medication and states he takes this at home as well.  SLP observed patient consuming his lunch, swallow and respiratory coordination was functional and patient passed 3 ounce yellow swallow challenge.  He is noted to intermittently clear his throat and cough during his meal and he indicates this is a throat clear advised to use dysphagia mitigation strategies with p.o. to assure adequate airway protection.    Importance of taking rest breaks when short of breath reviewed and continuing use of his flutter valve for airway clearance.  Advised that he refrain from exercising immediately after eating due to his history of reflux.  Patient reports he is scheduled to have a colonoscopy in December 2025 and SLP would recommend to consider esophageal endoscopy given his long-term history of reflux-he speak to Dr. Leigh, his  GI MD, about this prior to having endoscopy completed. Wrote swallow precautions sign as well as mitigation strategies and provided to patient and his wife.  No SLP follow-up needed.  Madelin POUR, MS [facility] SLP Acute [employer] [phone]

## 2024-04-18 NOTE — Evaluation (Signed)
 Clinical/Bedside Swallow Evaluation Patient Details  Name: [name] MRN: [id number] Date of Birth: 1951/07/22  Today's Date: 04/18/2024 Time: SLP Start Time (ACUTE ONLY): 1145 SLP Stop Time (ACUTE ONLY): 1235 SLP Time Calculation (min) (ACUTE ONLY): 50 min  Past Medical History:  Past Medical History:  Diagnosis Date   Abnormal finding on GI tract imaging 09/07/2023   Acute diastolic CHF (congestive heart failure) (HCC) 05/07/2023   Acute on chronic diastolic CHF (congestive heart failure) (HCC) 05/06/2023   Acute on chronic respiratory failure with hypoxia (HCC) 05/06/2023   Aortic atherosclerosis (HCC) 01/26/2023   Arthritis    Atrial fibrillation (HCC) 10/26/2011   Benign neoplasm of colon 09/07/2023   BPH (benign prostatic hyperplasia) 2011   BRONCHOPNEUMONIA ORGANISM UNSPECIFIED 06/12/2010         Replacing diagnoses that were inactivated after the 12/21/22 regulatory import   Carpal tunnel syndrome, right upper limb 11/03/2022   Chronic respiratory failure with hypoxia (HCC) 08/27/2015   Congestive heart failure (HCC)    COPD (chronic obstructive pulmonary disease) (HCC)    COPD with acute exacerbation (HCC) 05/26/2023   Coronary artery calcification 01/26/2023   Cubital tunnel syndrome on right 07/09/2023   Depression    DOE (dyspnea on exertion) 04/07/2023   Essential hypertension 06/05/2008   Qualifier: Diagnosis of   By: [name]), [name]       GERD (gastroesophageal reflux disease)    Hypothyroidism 03/30/2012   Lesion of ulnar nerve, right upper limb 11/03/2022   Lymphadenopathy syndrome 09/12/2015   Mediastinal adenopathy 09/12/2015   NHL (non-Hodgkin's lymphoma) (HCC) 09/25/2015   Numbness of right hand 06/28/2022   Obesity    Open wound of right hand without foreign body 07/27/2021   Other dysphagia 11/11/2010   Qualifier: Diagnosis of   By: [name], [name]       Polycythemia 01/26/2023   Port catheter in place 08/20/2016   Pulmonary  hypertension (HCC)    Pure hypercholesterolemia 12/03/2011   Recurrent pneumonia 02/10/2012   Routine general medical examination at a health care facility 12/03/2011   Squamous cell carcinoma lung (HCC) 09/26/2010   S/p LUL resection Dr Brantley dec 2011      Status post bronchoscopy with biopsy    Stiffness of finger joint of right hand 09/24/2021   Testicular swelling 09/12/2015   Past Surgical History:  Past Surgical History:  Procedure Laterality Date   APPENDECTOMY     BACK SURGERY     CARDIOVERSION  11/27/2011   Procedure: CARDIOVERSION;  Surgeon: Aleene JINNY Passe, MD;  Location: [facility] ENDOSCOPY;  Service: Cardiovascular;  Laterality: N/A;   COLONOSCOPY     COLONOSCOPY WITH PROPOFOL  N/A 09/07/2023   Procedure: COLONOSCOPY WITH PROPOFOL ;  Surgeon: Leigh Elspeth SQUIBB, MD;  Location: [facility] ENDOSCOPY;  Service: Gastroenterology;  Laterality: N/A;   FUDUCIAL PLACEMENT Right 10/13/2019   Procedure: Placement Of Fuducial;  Surgeon: Shelah Lamar RAMAN, MD;  Location: [facility] OR;  Service: Thoracic;  Laterality: Right;  Right Upper Lobe    LEFT HEART CATH AND CORONARY ANGIOGRAPHY N/A 04/12/2023   Procedure: LEFT HEART CATH AND CORONARY ANGIOGRAPHY;  Surgeon: Anner Alm ORN, MD;  Location: [facility] INVASIVE CV LAB;  Service: Cardiovascular;  Laterality: N/A;   LUNG CANCER SURGERY  2011   POLYPECTOMY  09/07/2023   Procedure: POLYPECTOMY;  Surgeon: Leigh Elspeth SQUIBB, MD;  Location: [facility]OSCOPY;  Service: Gastroenterology;;   UPPER GI ENDOSCOPY     VIDEO BRONCHOSCOPY WITH ENDOBRONCHIAL NAVIGATION N/A 10/13/2019   Procedure: VIDEO  BRONCHOSCOPY WITH ENDOBRONCHIAL NAVIGATION;  Surgeon: Shelah Lamar RAMAN, MD;  Location: [facility] OR;  Service: Thoracic;  Laterality: N/A;   VIDEO BRONCHOSCOPY WITH ENDOBRONCHIAL ULTRASOUND N/A 10/13/2019   Procedure: VIDEO BRONCHOSCOPY WITH ENDOBRONCHIAL ULTRASOUND;  Surgeon: Shelah Lamar RAMAN, MD;  Location: [facility] OR;  Service: Thoracic;  Laterality: N/A;   HPI:  Patient is a 73 year old male  admitted to [facility] with respiratory difficulties.  Past medical history significant for severe COPD, GERD, mild oropharyngeal dysphagia diagnosed in 2017, recurrent pneumonia.  He was recently in the hospital for pneumonia.  Patient had undergone MBS in 2017 by this SLP where mild oropharyngeal dysphagia was diagnosed with silent aspiration of thin liquids when drinking sequentially and minimal pharyngeal retention.  Barium tablet taken with pudding appeared to halt in esophagus with outpatient sensation.  It did not appear to clear with liquids, radiologist was not present to confirm findings.  Patient noted to have colonoscopy in December 2020 for but do not locate where he has had any prior esophageal evaluations.  He does take a PPI at home and denies any issues that are significant with swallowing.  Intentional weight loss since SLP saw him in 2017 reported.    Assessment / Plan / Recommendation  Clinical Impression  Patient known to SLP from prior admission in 2017 when he underwent modified barium swallow study that showed mild dysphagia.  At that time patient did have a trace silent aspiration when drinking thin liquids sequentially and minimal laryngeal penetration with single boluses.  Barium tablet taken with pudding appeared to halted at mid esophagus and did not clear with liquid swallows, radiologist was not present to confirm.  At this time patient reports his swallow to be functional and he denies this being the source of his admission.  He and his wife suspect current hospital admission is due to side effects of medication patient took causing severe dehydration without ability to clear secretions as well as weed killer and dust exposure.  He denies having overt symptoms of reflux nor dysphagia. Suspect his primary risk if he is aspirating is esophageal and/or due to GERD.      SLP reviewed prior MBS with patient and his wife [name].  Recommend if patient has recurrent hospital  admissions for respiratory issues without clear source, consider esophagram with tablet given patient's history of chronic GERD.  He is currently taking a reflux medication and states he takes this at home as well.  SLP observed patient consuming his lunch, swallow and respiratory coordination was functional and patient passed 3 ounce yellow swallow challenge.  He is noted to intermittently clear his throat and cough during his meal and he indicates this is a throat clear advised to use dysphagia mitigation strategies with p.o. to assure adequate airway protection.       Importance of taking rest breaks when short of breath reviewed and continuing use of his flutter valve for airway clearance.  Advised that he refrain from exercising immediately after eating due to his history of reflux.  Patient reports he is scheduled to have a colonoscopy in December 2025 and SLP would recommend to consider esophageal endoscopy given his long-term history of reflux-he speak to Dr. Leigh, his GI MD, about this prior to having endoscopy completed. Wrote swallow precautions sign as well as mitigation strategies and provided to patient and his wife.  No SLP follow-up needed. SLP Visit Diagnosis: Dysphagia, oropharyngeal phase (R13.12)    Aspiration Risk  Mild aspiration risk  Diet Recommendation Regular;Thin liquid    Liquid Administration via: Straw;Cup Medication Administration: Whole meds with liquid Supervision: Patient able to self feed Compensations: Slow rate;Small sips/bites (start intake with liquids) Postural Changes: Seated upright at 90 degrees;Remain upright for at least 30 minutes after po intake    Other  Recommendations Oral Care Recommendations: Oral care BID     Assistance Recommended at Discharge    Functional Status Assessment Patient has had a recent decline in their functional status and demonstrates the ability to make significant improvements in function in a reasonable and predictable  amount of time.  Frequency and Duration            Prognosis        Swallow Study   General Date of Onset: 04/18/24 HPI: Patient is a 73 year old male admitted to [facility] with respiratory difficulties.  Past medical history significant for severe COPD, GERD, mild oropharyngeal dysphagia diagnosed in 2017, recurrent pneumonia.  He was recently in the hospital for pneumonia.  Patient had undergone MBS in 2017 by this SLP where mild oropharyngeal dysphagia was diagnosed with silent aspiration of thin liquids when drinking sequentially and minimal pharyngeal retention.  Barium tablet taken with pudding appeared to halt in esophagus with outpatient sensation.  It did not appear to clear with liquids, radiologist was not present to confirm findings.  Patient noted to have colonoscopy in December 2020 for but do not locate where he has had any prior esophageal evaluations.  He does take a PPI at home and denies any issues that are significant with swallowing.  Intentional weight loss since SLP saw him in 2017 reported. Type of Study: Bedside Swallow Evaluation Diet Prior to this Study: Regular;Thin liquids (Level 0) Temperature Spikes Noted: No Respiratory Status: Room air (Patient had taken his oxygen  off to talk on the phone but his oxygen  saturation was about 95.,  He reports he does not use oxygen  100% of the time at home.) History of Recent Intubation: No Behavior/Cognition: Alert;Cooperative;Pleasant mood Oral Cavity Assessment: Within Functional Limits Oral Care Completed by SLP: No Oral Cavity - Dentition: Adequate natural dentition Vision: Functional for self-feeding Self-Feeding Abilities: Able to feed self Patient Positioning: Upright in bed Baseline Vocal Quality: Normal Volitional Cough: Strong (Noted patient's sputum appeared to have part of a pill and it) Volitional Swallow: Able to elicit    Oral/Motor/Sensory Function Overall Oral Motor/Sensory Function: Within  functional limits   Ice Chips Ice chips: Not tested   Thin Liquid Thin Liquid: Impaired Pharyngeal  Phase Impairments: Throat Clearing - Immediate;Throat Clearing - Delayed;Cough - Delayed    Nectar Thick Nectar Thick Liquid: Not tested   Honey Thick Honey Thick Liquid: Not tested   Puree Puree: Within functional limits Presentation: Self Fed;Spoon   Solid     Solid: Within functional limits Presentation: Self Fed;Spoon      Nicolas Emmie Caldron 04/18/2024,1:07 PM  Madelin POUR, MS [facility] SLP Acute Rehab Services Office [phone]

## 2024-04-18 NOTE — Progress Notes (Signed)
 Congested cough.SABRASABRAPt continues to produce purulent (pink to milky creamy pus-like) sputum. Dyspnea noted with activity. SRP, RN

## 2024-04-19 DIAGNOSIS — J9621 Acute and chronic respiratory failure with hypoxia: Secondary | ICD-10-CM | POA: Diagnosis not present

## 2024-04-19 DIAGNOSIS — J441 Chronic obstructive pulmonary disease with (acute) exacerbation: Secondary | ICD-10-CM | POA: Diagnosis not present

## 2024-04-19 LAB — LEGIONELLA PNEUMOPHILA SEROGP 1 UR AG: L. pneumophila Serogp 1 Ur Ag: NEGATIVE

## 2024-04-19 LAB — COMPREHENSIVE METABOLIC PANEL WITH GFR
ALT: 15 U/L (ref 0–44)
AST: 17 U/L (ref 15–41)
Albumin: 3.1 g/dL — ABNORMAL LOW (ref 3.5–5.0)
Alkaline Phosphatase: 86 U/L (ref 38–126)
Anion gap: 8 (ref 5–15)
BUN: 43 mg/dL — ABNORMAL HIGH (ref 8–23)
CO2: 25 mmol/L (ref 22–32)
Calcium: 8.8 mg/dL — ABNORMAL LOW (ref 8.9–10.3)
Chloride: 100 mmol/L (ref 98–111)
Creatinine, Ser: 1.1 mg/dL (ref 0.61–1.24)
GFR, Estimated: >60 mL/min (ref >60)
Glucose, Bld: 168 mg/dL — ABNORMAL HIGH (ref 70–99)
Potassium: 4.5 mmol/L (ref 3.5–5.1)
Sodium: 133 mmol/L — ABNORMAL LOW (ref 135–145)
Total Bilirubin: 0.8 mg/dL (ref 0.0–1.2)
Total Protein: 6.5 g/dL (ref 6.5–8.1)

## 2024-04-19 LAB — EXPECTORATED SPUTUM ASSESSMENT W GRAM STAIN, RFLX TO RESP C

## 2024-04-19 LAB — MAGNESIUM: Magnesium: 2.1 mg/dL (ref 1.7–2.4)

## 2024-04-19 LAB — CBC
HCT: 32.4 % — ABNORMAL LOW (ref 39.0–52.0)
Hemoglobin: 10 g/dL — ABNORMAL LOW (ref 13.0–17.0)
MCH: 27.5 pg (ref 26.0–34.0)
MCHC: 30.9 g/dL (ref 30.0–36.0)
MCV: 89 fL (ref 80.0–100.0)
Platelets: 201 K/uL (ref 150–400)
RBC: 3.64 MIL/uL — ABNORMAL LOW (ref 4.22–5.81)
RDW: 14.2 % (ref 11.5–15.5)
WBC: 11.1 K/uL — ABNORMAL HIGH (ref 4.0–10.5)
nRBC: 0 % (ref 0.0–0.2)

## 2024-04-19 LAB — PHOSPHORUS: Phosphorus: 2.7 mg/dL (ref 2.5–4.6)

## 2024-04-19 MED ORDER — SODIUM CHLORIDE 0.9 % IV SOLN
3.0000 g | Freq: Four times a day (QID) | INTRAVENOUS | Status: DC
  Administered 2024-04-19 – 2024-04-21 (×8): 3 g via INTRAVENOUS
  Filled 2024-04-19 (×8): qty 8

## 2024-04-19 MED ORDER — IPRATROPIUM-ALBUTEROL 0.5-2.5 (3) MG/3ML IN SOLN: 3.0000 mL | Freq: Four times a day (QID) | RESPIRATORY_TRACT | Status: DC

## 2024-04-19 MED ORDER — GUAIFENESIN ER 600 MG PO TB12
1200.0000 mg | ORAL_TABLET | Freq: Two times a day (BID) | ORAL | Status: DC
  Administered 2024-04-19 – 2024-04-21 (×4): 1200 mg via ORAL
  Filled 2024-04-19 (×4): qty 2

## 2024-04-19 MED ORDER — ALBUTEROL SULFATE (2.5 MG/3ML) 0.083% IN NEBU
2.5000 mg | INHALATION_SOLUTION | Freq: Four times a day (QID) | RESPIRATORY_TRACT | Status: DC
  Administered 2024-04-19 – 2024-04-21 (×8): 2.5 mg via RESPIRATORY_TRACT
  Filled 2024-04-19 (×8): qty 3

## 2024-04-19 MED ORDER — ALBUTEROL SULFATE (2.5 MG/3ML) 0.083% IN NEBU: 2.5000 mg | INHALATION_SOLUTION | RESPIRATORY_TRACT | Status: DC | PRN

## 2024-04-19 NOTE — Progress Notes (Signed)
 I am [facility]  PROGRESS NOTE  [name] FMW:[id number] DOB: 1951-04-19 DOA: 04/17/2024 PCP: Iven Lang DASEN, PA-C   Brief HPI:   73 y.o. male, with a history of COPD on 2 L/min of oxygen  at home chronically, diastolic heart failure with last echo from May 2024 showed EF 60 to 65%, non-small cell lung cancer of right upper lobe, status post SBRT, non-Hodgkin lymphoma, hypothyroidism, atrial fibrillation on Eliquis , hypertension, hyperlipidemia who was recently discharged from the hospital on 04/05/2024 after treated for pneumonia.  Patient says that he was prescribed 2 diuretics both Lasix  and HCTZ..  Patient was not discharged on HCTZ on 04/05/2024, it was started on 04/12/2024.  As per pharmacy he was started on HCTZ by his PCP, Dr. Norleen Fearing in [place]  In the ED patient was found to have abnormal chest x-ray with possible loculated fluid in the right upper lobe. Also required full dose pulmonary of oxygen  via nasal cannula. CT chest without contrast was ordered. Patient started on vancomycin  and cefepime .    Assessment/Plan:    Acute on chronic hypoxemic respiratory failure-presented with worsening shortness of breath, has a history of COPD and recently treated for pneumonia.  Likely from underlying COPD exacerbation and possible pneumonia.  Patient has significant rhonchi and unable to cough up phlegm.  Improved after starting hypertonic saline nebulizer treatments, Mucinex  600 mg p.o. twice daily, Solu-Medrol  40 mg IV twice daily followed by prednisone  40 mg daily.  Procalcitonin 0.45.  Will increase the dose of Mucinex  to 1200 mg p.o. twice daily.    Right upper and middle lobe consolidation-seen on CT chest, procalcitonin only 0.45.  He has history of malignancy in the past.  PCCM consulted, CCM added Unasyn  for Acinetobacter.   Atrial fibrillation-patient has history of atrial fibrillation, currently on Toprol -XL for rate control and Eliquis  for anticoagulation.  Will  monitor on telemetry.   Chronic diastolic heart failure-last EF 65% as per echo from 2024.  BNP 331, has been chronically elevated around 300s.  Patient takes Lasix  20 mg daily at home.  Appears euvolemic.  Will hold Lasix  at this time.   Hypothyroidism-continue Synthroid    BPH-continue finasteride    GERD-continue Protonix    Medications     acetylcysteine   3 mL Nebulization QID   allopurinol   300 mg Oral Daily   amLODipine   5 mg Oral Daily   apixaban   5 mg Oral BID   budesonide -glycopyrrolate -formoterol   2 puff Inhalation BID   Chlorhexidine  Gluconate Cloth  6 each Topical Daily   ezetimibe   10 mg Oral Daily   ferrous sulfate   325 mg Oral Daily   finasteride   5 mg Oral Daily   furosemide   20 mg Oral Daily   guaiFENesin   600 mg Oral BID   ipratropium-albuterol   3 mL Nebulization QID   levothyroxine   175 mcg Oral Daily   metoprolol  succinate  25 mg Oral Daily   pantoprazole   40 mg Oral Daily   predniSONE   40 mg Oral Q breakfast   sodium chloride  flush  3 mL Intravenous Q12H   sodium chloride  HYPERTONIC  4 mL Nebulization BID     Data Reviewed:   CBG:  No results for input(s): GLUCAP in the last 168 hours.  SpO2: 97 % O2 Flow Rate (L/min): 2 L/min FiO2 (%): 28 %    Vitals:   04/18/24 2046 04/18/24 2047 04/18/24 2053 04/19/24 0504  BP:    132/78  Pulse: 73 66 77 78  Resp: 17 12 11  18  Temp:    97.8 F (36.6 C)  TempSrc:    Oral  SpO2: 96%   97%  Weight:      Height:          Data Reviewed:  Basic Metabolic Panel: Recent Labs  Lab 04/14/24 0829 04/17/24 1409 04/18/24 0401 04/19/24 0350  NA 142 135 135 133*  K 5.0 4.3 4.2 4.5  CL 102 99 99 100  CO2 25 29 26 25   GLUCOSE 104* 99 131* 168*  BUN 27 31* 31* 43*  CREATININE 1.13 1.05 1.22 1.10  CALCIUM  9.3 9.0 8.7* 8.8*  MG  --   --   --  2.1  PHOS  --   --   --  2.7    CBC: Recent Labs  Lab 04/17/24 1409 04/18/24 0401 04/19/24 0350  WBC 12.2* 11.2* 11.1*  HGB 10.7* 10.3* 10.0*  HCT 34.6*  32.9* 32.4*  MCV 89.6 91.1 89.0  PLT 214 197 201    LFT Recent Labs  Lab 04/18/24 0401 04/19/24 0350  AST 19 17  ALT 15 15  ALKPHOS 89 86  BILITOT 1.1 0.8  PROT 6.3* 6.5  ALBUMIN 3.2* 3.1*     Antibiotics: Anti-infectives (From admission, onward)    Start     Dose/Rate Route Frequency Ordered Stop   04/18/24 1500  vancomycin  (VANCOREADY) IVPB 1500 mg/300 mL  Status:  Discontinued        1,500 mg 150 mL/hr over 120 Minutes Intravenous Every 24 hours 04/17/24 1727 04/18/24 1411   04/17/24 1500  vancomycin  (VANCOREADY) IVPB 2000 mg/400 mL        2,000 mg 200 mL/hr over 120 Minutes Intravenous  Once 04/17/24 1447 04/17/24 1912   04/17/24 1445  ceFEPIme  (MAXIPIME ) 2 g in sodium chloride  0.9 % 100 mL IVPB        2 g 200 mL/hr over 30 Minutes Intravenous Every 8 hours 04/17/24 1430          DVT prophylaxis: Apixaban   Code Status: Full code  Family Communication: No family at bedside   CONSULTS PCCM   Subjective   Breathing has improved.  Still coughing up phlegm.   Objective    Physical Examination:   Physical Exam Constitutional:      General: He is not in acute distress. HENT:     Head: Normocephalic and atraumatic.  Cardiovascular:     Rate and Rhythm: Normal rate and regular rhythm.     Heart sounds: No murmur heard. Pulmonary:     Effort: Pulmonary effort is normal.     Breath sounds: Examination of the right-middle field reveals rhonchi. Examination of the left-middle field reveals rhonchi. Examination of the right-lower field reveals rhonchi. Examination of the left-lower field reveals rhonchi. Rhonchi present.  Abdominal:     Palpations: Abdomen is soft. There is no splenomegaly.     Tenderness: There is no abdominal tenderness.  Musculoskeletal:     Right lower leg: No edema.     Left lower leg: No edema.  Skin:    General: Skin is warm and dry.  Neurological:     General: No focal deficit present.     Mental Status: He is alert and  oriented to person, place, and time.  Psychiatric:        Mood and Affect: Mood normal.        Behavior: Behavior normal.       Status is: Inpatient:  [name]   [facility] If 7PM-7AM, please contact night-coverage at [email or web address], Office  [phone]   04/19/2024, 8:06 AM  LOS: 2 days

## 2024-04-19 NOTE — Progress Notes (Signed)
 CPT with Fl[employer] completed first rounds - remains on Worklist for  0925.

## 2024-04-19 NOTE — Consult Note (Signed)
 NAME:  [name], MRN:  [id number], DOB:  08/14/1951, LOS: 2 ADMISSION DATE:  04/17/2024, CONSULTATION DATE:  04/18/24 REFERRING MD:  Dr DRUSILLA, CHIEF COMPLAINT:  Acute on chronic respiratory fialure    History of Present Illness:  73 year old patient with severe COPD.  Also atrial fibrillation on Eliquis .  Of note and for many years since her original start of practice in 2008 but he reestablished in October 2024.  He has a remote history of non-Hodgkin's lymphoma.  His oncology history is detailed below and is on observation therapy currently by Dr. Sherrod.  On his most recent CT chest May 2025 prior to current hospitalizations he had small 4 mm nodule on the left lower lobe.  1 year follow-up was recommended.  He saw Dr. Geronimo for pulmonary office follow-up in March 21 2024 and was in stable health apart from a history of frequent COPD exacerbations for which biologic therapy was being entertained [he did not get his CBC done].  At baseline uses 2 L nasal cannula at night  He then was admitted 03/30/2024 - 04/02/2024 with worsening shortness of breath following working in his garage all day.  Wife also reported that prior to visit he was using a lot of weed killer and getting exposed to a lot of dust in the garage.  He was hypoxemic to the 80s streaky hemoptysis.  Treated with antibiotics and prednisone  taper and discharged.  Multiplex PCR panel negative for respiratory viruses. His CT chest this asmit showed:  Interim since 2 months ago. development of heterogeneous airspace disease in the right middle and left greater than right lower lobes, suspicious for multifocal pneumonia. Trace left-sided pleural effusio  Then readmitted 04/17/2024 with 1 day history of fever and feeling dry and difficulty coughing up sputum.  Started on vancomycin  and Zosyn . CT chest this admit: C. Extensive airspace disease with air bronchograms in the left lower lobe with less pronounced nodular airspace disease in the  left upper lobe and right lower lobe. Findings compatible with multifocal pneumonia. He is on Vanc and Cefepime .    On 7/29 - [facility] MD  reports afebrile and normal procal but congested cough.SABRASABRAPt continues to produce purulent (pink to milky creamy pus-like) sputum. And ccm consulted.  PAteient repots that between first and 2nd admit he was given new hydrochlorothiazide  but it made him feel dehydrated.  And this then made very difficult for him to be no sputum and then made him hypoxemic.  He denies any dysphagia.  Nursing reports good swallowing at the bedside.  CCM comsilteed    Past Medical History:  1) stage IA (T1c, N0, M0) non-small cell lung cancer, squamous cell carcinoma diagnosed in January 2021 2) Stage III large B-cell non-Hodgkin lymphoma diagnosed in December 2016 and presented with extensive lymphadenopathy involving the neck, chest, abdomen as well as splenomegaly.    PRIOR THERAPY:  1) Systemic chemotherapy with the CHOP/Rituxan  every 3 weeks with Neulasta  support. Status post 6 cycles. 2) SBRT to the right upper lobe squamous cell carcinoma under the care of Dr. Shannon in February 202  has a past medical history of Abnormal finding on GI tract imaging (09/07/2023), Acute diastolic CHF (congestive heart failure) (HCC) (05/07/2023), Acute on chronic diastolic CHF (congestive heart failure) (HCC) (05/06/2023), Acute on chronic respiratory failure with hypoxia (HCC) (05/06/2023), Aortic atherosclerosis (HCC) (01/26/2023), Arthritis, Atrial fibrillation (HCC) (10/26/2011), Benign neoplasm of colon (09/07/2023), BPH (benign prostatic hyperplasia) (2011), BRONCHOPNEUMONIA ORGANISM UNSPECIFIED (06/12/2010), Carpal tunnel syndrome, right upper limb (  11/03/2022), Chronic respiratory failure with hypoxia (HCC) (08/27/2015), Congestive heart failure (HCC), COPD (chronic obstructive pulmonary disease) (HCC), COPD with acute exacerbation (HCC) (05/26/2023), Coronary artery calcification  (01/26/2023), Cubital tunnel syndrome on right (07/09/2023), Depression, DOE (dyspnea on exertion) (04/07/2023), Essential hypertension (06/05/2008), GERD (gastroesophageal reflux disease), Hypothyroidism (03/30/2012), Lesion of ulnar nerve, right upper limb (11/03/2022), Lymphadenopathy syndrome (09/12/2015), Mediastinal adenopathy (09/12/2015), NHL (non-Hodgkin's lymphoma) (HCC) (09/25/2015), Numbness of right hand (06/28/2022), Obesity, Open wound of right hand without foreign body (07/27/2021), Other dysphagia (11/11/2010), Polycythemia (01/26/2023), Port catheter in place (08/20/2016), Pulmonary hypertension (HCC), Pure hypercholesterolemia (12/03/2011), Recurrent pneumonia (02/10/2012), Routine general medical examination at a health care facility (12/03/2011), Squamous cell carcinoma lung (HCC) (09/26/2010), Status post bronchoscopy with biopsy, Stiffness of finger joint of right hand (09/24/2021), and Testicular swelling (09/12/2015).   reports that he quit smoking about 22 years ago. His smoking use included cigarettes. He started smoking about 65 years ago. He has a 129 pack-year smoking history. He has been exposed to tobacco smoke. He has never used smokeless tobacco.  Past Surgical History:  Procedure Laterality Date   APPENDECTOMY     BACK SURGERY     CARDIOVERSION  11/27/2011   Procedure: CARDIOVERSION;  Surgeon: Aleene JINNY Passe, MD;  Location: [facility] ENDOSCOPY;  Service: Cardiovascular;  Laterality: N/A;   COLONOSCOPY     COLONOSCOPY WITH PROPOFOL  N/A 09/07/2023   Procedure: COLONOSCOPY WITH PROPOFOL ;  Surgeon: Leigh Elspeth SQUIBB, MD;  Location: [facility] ENDOSCOPY;  Service: Gastroenterology;  Laterality: N/A;   FUDUCIAL PLACEMENT Right 10/13/2019   Procedure: Placement Of Fuducial;  Surgeon: Shelah Lamar RAMAN, MD;  Location: [facility] OR;  Service: Thoracic;  Laterality: Right;  Right Upper Lobe    LEFT HEART CATH AND CORONARY ANGIOGRAPHY N/A 04/12/2023   Procedure: LEFT HEART CATH AND CORONARY  ANGIOGRAPHY;  Surgeon: Anner Alm ORN, MD;  Location: [facility] INVASIVE CV LAB;  Service: Cardiovascular;  Laterality: N/A;   LUNG CANCER SURGERY  2011   POLYPECTOMY  09/07/2023   Procedure: POLYPECTOMY;  Surgeon: Leigh Elspeth SQUIBB, MD;  Location: [facility] ENDOSCOPY;  Service: Gastroenterology;;   UPPER GI ENDOSCOPY     VIDEO BRONCHOSCOPY WITH ENDOBRONCHIAL NAVIGATION N/A 10/13/2019   Procedure: VIDEO BRONCHOSCOPY WITH ENDOBRONCHIAL NAVIGATION;  Surgeon: Shelah Lamar RAMAN, MD;  Location: [facility] OR;  Service: Thoracic;  Laterality: N/A;   VIDEO BRONCHOSCOPY WITH ENDOBRONCHIAL ULTRASOUND N/A 10/13/2019   Procedure: VIDEO BRONCHOSCOPY WITH ENDOBRONCHIAL ULTRASOUND;  Surgeon: Shelah Lamar RAMAN, MD;  Location: [facility] OR;  Service: Thoracic;  Laterality: N/A;    Allergies  Allergen Reactions   Hydrochlorothiazide  Other (See Comments)    Dried out the patient too much   Lipitor [Atorvastatin ] Other (See Comments)    Myalgias  Weakness  Fatigue    Omnipaque  [Iohexol ] Itching and Other (See Comments)    When asked about contrast media today, pt stated that last time he had the contrast that he was red all over and itchy from head to toe.    Tape Other (See Comments)    Band-Aids irritate the skin    Immunization History  Administered Date(s) Administered   Fluad Trivalent(High Dose 65+) 05/28/2023   Influenza Whole 06/21/2009, 08/30/2011   Influenza, High Dose Seasonal PF 06/10/2017, 07/27/2018, 06/22/2019   Influenza-Unspecified 09/16/2015, 05/13/2016   PNEUMOCOCCAL CONJUGATE-20 03/03/2021   Pneumococcal Conjugate-13 08/06/2017   Pneumococcal Polysaccharide-23 05/22/2008   Tdap 12/03/2011, 12/23/2021    Family History  Problem Relation Age of Onset   Heart disease Father    Cancer Neg Hx  Diabetes Neg Hx    Stroke Neg Hx    Hypertension Neg Hx    Kidney disease Neg Hx      Current Facility-Administered Medications:    acetylcysteine  (MUCOMYST ) 20 % nebulizer / oral solution 3 mL, 3 mL,  Nebulization, QID, Geronimo, [name], MD, 3 mL at 04/19/24 1203   allopurinol  (ZYLOPRIM ) tablet 300 mg, 300 mg, Oral, Daily, Drusilla, Gagan S, MD, 300 mg at 04/19/24 1050   amLODipine  (NORVASC ) tablet 5 mg, 5 mg, Oral, Daily, Drusilla, Sabas RAMAN, MD, 5 mg at 04/19/24 1050   apixaban  (ELIQUIS ) tablet 5 mg, 5 mg, Oral, BID, Kyere, Belinda K, NP, 5 mg at 04/19/24 1050   budesonide -glycopyrrolate -formoterol  (BREZTRI ) 160-9-4.8 MCG/ACT inhaler 2 puff, 2 puff, Inhalation, BID, Drusilla, Sabas RAMAN, MD, 2 puff at 04/19/24 0930   ceFEPIme  (MAXIPIME ) 2 g in sodium chloride  0.9 % 100 mL IVPB, 2 g, Intravenous, Q8H, Drusilla, Sabas RAMAN, MD, Last Rate: 200 mL/hr at 04/19/24 9177, 2 g at 04/19/24 9177   Chlorhexidine  Gluconate Cloth 2 % PADS 6 each, 6 each, Topical, Daily, Drusilla Sabas RAMAN, MD, 6 each at 04/18/24 1540   ezetimibe  (ZETIA ) tablet 10 mg, 10 mg, Oral, Daily, Drusilla, Sabas RAMAN, MD, 10 mg at 04/19/24 1050   ferrous sulfate  tablet 325 mg, 325 mg, Oral, Daily, Drusilla, Sabas RAMAN, MD, 325 mg at 04/19/24 1050   finasteride  (PROSCAR ) tablet 5 mg, 5 mg, Oral, Daily, Drusilla, Sabas RAMAN, MD, 5 mg at 04/19/24 1050   guaiFENesin  (MUCINEX ) 12 hr tablet 1,200 mg, 1,200 mg, Oral, BID, Lama, Sabas RAMAN, MD   ipratropium-albuterol  (DUONEB) 0.5-2.5 (3) MG/3ML nebulizer solution 3 mL, 3 mL, Nebulization, QID, Drusilla, Sabas RAMAN, MD, 3 mL at 04/19/24 1203   levothyroxine  (SYNTHROID ) tablet 175 mcg, 175 mcg, Oral, Daily, Drusilla Sabas RAMAN, MD, 175 mcg at 04/19/24 9471   metoprolol  succinate (TOPROL -XL) 24 hr tablet 25 mg, 25 mg, Oral, Daily, Drusilla, Gagan S, MD, 25 mg at 04/19/24 1050   ondansetron  (ZOFRAN ) tablet 4 mg, 4 mg, Oral, Q6H PRN **OR** ondansetron  (ZOFRAN ) injection 4 mg, 4 mg, Intravenous, Q6H PRN, Drusilla, Sabas RAMAN, MD   pantoprazole  (PROTONIX ) EC tablet 40 mg, 40 mg, Oral, Daily, Drusilla, Sabas RAMAN, MD, 40 mg at 04/19/24 1050   [COMPLETED] methylPREDNISolone  sodium succinate  (SOLU-MEDROL ) 40 mg/mL injection 40 mg, 40 mg, Intravenous, Q12H, 40 mg at 04/18/24 0635  **FOLLOWED BY** predniSONE  (DELTASONE ) tablet 40 mg, 40 mg, Oral, Q breakfast, Drusilla, Gagan S, MD, 40 mg at 04/19/24 9184   sodium chloride  flush (NS) 0.9 % injection 10-40 mL, 10-40 mL, Intracatheter, PRN, Drusilla, Sabas RAMAN, MD   sodium chloride  flush (NS) 0.9 % injection 3 mL, 3 mL, Intravenous, Q12H, Drusilla, Gagan S, MD, 3 mL at 04/18/24 2152   sodium chloride  flush (NS) 0.9 % injection 3 mL, 3 mL, Intravenous, PRN, Drusilla, Sabas RAMAN, MD     Significant Hospital Events:  04/17/2024 - admit  Interim History / Subjective:   Ambulated the halls today and did get short of breath with hypoxemia.  Afebrile.  Swallow evaluation - no aspiration  Objective   Blood pressure 132/78, pulse 78, temperature 97.8 F (36.6 C), temperature source Oral, resp. rate 18, height 6' (1.829 m), weight 91.6 kg, SpO2 97%.    FiO2 (%):  [28 %] 28 %   Intake/Output Summary (Last 24 hours) at 04/19/2024 1218 Last data filed at 04/19/2024 0506 Gross per 24 hour  Intake 600 ml  Output 2175 ml  Net -1575  ml   Filed Weights   04/17/24 2057  Weight: 91.6 kg    Examination: Chronically ill appearing on nasal cannula No respiratory distress Coarse breath sounds in LLL with occasional wheeze Irregularly irregular HR 70s No peripheral edema   Echocardiogram shows preserved LVEF, no pulmonary hypertension Respiratory virus panel negative CT Chest compared from July 10 to 7/28 show persistent to worsening LLL consolidation, stable RUL changes. Centrilobular emphysema  Resolved Hospital Problem list   x  Assessment & Plan:   PULMONARY  A:  Acute on chronic hypoxemic respiratory failure -secondary to left lower lobe right middle lobe consolidation  Acinetobacter cultures positive  P:   Received 5 days of ciprofloxacin  for acinetobacter sputum culture positive at last admission. May benefit from longer course this time around. Discussed with pharmacy. Will add unasyn  to cover acinetobacter and I have ordered a  repeat sputum culture.  Continue oxygen , wean for goal sats over 90% Continue bronchodilators and mucomyst . Stop duoneb as he is already on muscarinic agonist with the breztri . Prn albuterol  nebs.  Continue guaifenisen, agree with increased dose Continue prednisone  for severe CAP   Best practice (daily eval):  PER [facility]  I spent 50 minutes in total visit time for this patient, with more than 50% spent counseling/coordinating care.   Verdon Gore, MD Pulmonary and Critical Care Medicine [facility] 04/19/2024 12:31 PM Pager: see AMION  If no response to pager, please call critical care on call (see AMION) until 7pm After 7:00 pm call Elink     LABS    PULMONARY No results for input(s): PHART, PCO2ART, PO2ART, HCO3, TCO2, O2SAT in the last 168 hours.  Invalid input(s): PCO2, PO2  CBC Recent Labs  Lab 04/17/24 1409 04/18/24 0401 04/19/24 0350  HGB 10.7* 10.3* 10.0*  HCT 34.6* 32.9* 32.4*  WBC 12.2* 11.2* 11.1*  PLT 214 197 201    COAGULATION No results for input(s): INR in the last 168 hours.  CARDIAC  No results for input(s): TROPONINI in the last 168 hours. No results for input(s): PROBNP in the last 168 hours.  CHEMISTRY Recent Labs  Lab 04/14/24 0829 04/17/24 1409 04/18/24 0401 04/19/24 0350  NA 142 135 135 133*  K 5.0 4.3 4.2 4.5  CL 102 99 99 100  CO2 25 29 26 25   GLUCOSE 104* 99 131* 168*  BUN 27 31* 31* 43*  CREATININE 1.13 1.05 1.22 1.10  CALCIUM  9.3 9.0 8.7* 8.8*  MG  --   --   --  2.1  PHOS  --   --   --  2.7   Estimated Creatinine Clearance: 65.6 mL/min (by C-G formula based on SCr of 1.1 mg/dL).   LIVER Recent Labs  Lab 04/18/24 0401 04/19/24 0350  AST 19 17  ALT 15 15  ALKPHOS 89 86  BILITOT 1.1 0.8  PROT 6.3* 6.5  ALBUMIN 3.2* 3.1*     INFECTIOUS Recent Labs  Lab 04/17/24 1502 04/17/24 1738 04/17/24 2059  LATICACIDVEN 0.4* 0.8  --   PROCALCITON  --   --  0.45     ENDOCRINE CBG  (last 3)  No results for input(s): GLUCAP in the last 72 hours.

## 2024-04-20 ENCOUNTER — Telehealth: Payer: Self-pay | Admitting: Internal Medicine

## 2024-04-20 DIAGNOSIS — J441 Chronic obstructive pulmonary disease with (acute) exacerbation: Secondary | ICD-10-CM | POA: Diagnosis not present

## 2024-04-20 DIAGNOSIS — R0602 Shortness of breath: Secondary | ICD-10-CM | POA: Diagnosis not present

## 2024-04-20 DIAGNOSIS — J189 Pneumonia, unspecified organism: Secondary | ICD-10-CM | POA: Diagnosis not present

## 2024-04-20 DIAGNOSIS — J9621 Acute and chronic respiratory failure with hypoxia: Secondary | ICD-10-CM | POA: Diagnosis not present

## 2024-04-20 NOTE — TOC Initial Note (Signed)
 Transition of Care [name]) - Initial/Assessment Note    Patient Details  Name: [name] MRN: [id number] Date of Birth: June 25, 1951  Transition of Care [facility]) CM/SW Contact:    Tawni CHRISTELLA Eva, LCSW Phone Number: 04/20/2024, 3:04 PM  Clinical Narrative:                  Pt from home with spouse, care management will follow for d/c needs .  Expected Discharge Plan:  (TBD) Barriers to Discharge: Continued Medical Work up   Patient Goals and CMS Choice            Expected Discharge Plan and Services                                              Prior Living Arrangements/Services                       Activities of Daily Living   ADL Screening (condition at time of admission) Independently performs ADLs?: Yes (appropriate for developmental age) Is the patient deaf or have difficulty hearing?: Yes Does the patient have difficulty seeing, even when wearing glasses/contacts?: No Does the patient have difficulty concentrating, remembering, or making decisions?: No  Permission Sought/Granted                  Emotional Assessment              Admission diagnosis:  Shortness of breath [R06.02] COPD exacerbation (HCC) [J44.1] Pneumonia due to infectious organism, unspecified laterality, unspecified part of lung [J18.9] Patient Active Problem List   Diagnosis Date Noted   COPD exacerbation (HCC) 04/17/2024   Senile purpura (HCC) 04/05/2024   Hyperkalemia 03/31/2024   Multifocal pneumonia 03/31/2024   Sepsis due to pneumonia (HCC) 03/30/2024   Statin myopathy 02/21/2024   Chronic constipation 02/21/2024   Primary osteoarthritis of left distal radioulnar joint 02/21/2024   Primary osteoarthritis of right distal radioulnar joint 01/28/2024   Idiopathic chronic gout of multiple sites without tophus 01/24/2024   Cubital tunnel syndrome on right 07/09/2023   COPD with acute exacerbation (HCC) 05/26/2023   Chronic diastolic CHF  (congestive heart failure) (HCC) 05/07/2023   Acute on chronic diastolic CHF (congestive heart failure) (HCC) 05/06/2023   Acute on chronic respiratory failure with hypoxia (HCC) 05/06/2023   DOE (dyspnea on exertion) 04/07/2023   Congestive heart failure (HCC) 01/26/2023   GERD (gastroesophageal reflux disease) 01/26/2023   Obesity 01/26/2023   Polycythemia 01/26/2023   Pulmonary hypertension (HCC) 01/26/2023   Aortic atherosclerosis (HCC) 01/26/2023   Coronary artery calcification 01/26/2023   Carpal tunnel syndrome, right upper limb 11/03/2022   Lesion of ulnar nerve, right upper limb 11/03/2022   Numbness of right hand 06/28/2022   Status post bronchoscopy with biopsy    Port catheter in place 08/20/2016   NHL (non-Hodgkin's lymphoma) (HCC) 09/25/2015   Lymphadenopathy syndrome 09/12/2015   Mediastinal adenopathy 09/12/2015   Chronic respiratory failure with hypoxia (HCC) 08/27/2015   Hypothyroidism 03/30/2012   Depression 12/03/2011   Pure hypercholesterolemia 12/03/2011   Atrial fibrillation (HCC) 10/26/2011   Other dysphagia 11/11/2010   Squamous cell carcinoma lung (HCC) 09/26/2010   BPH (benign prostatic hyperplasia) 2011   ERECTILE DYSFUNCTION 09/24/2008   Essential hypertension 06/05/2008   COPD (chronic obstructive pulmonary disease) (HCC) 06/05/2008   PCP:  Iven Lang DASEN, PA-C  Pharmacy:   [facility] [address], [place] - [address] [facility] [place] [address] Phone: [phone] Fax: [phone]     Social Drivers of Health (SDOH) Social History: SDOH Screenings   Food Insecurity: No Food Insecurity (04/17/2024)  Housing: Low Risk  (04/17/2024)  Transportation Needs: No Transportation Needs (04/17/2024)  Utilities: Not At Risk (04/17/2024)  Alcohol  Screen: Low Risk  (04/13/2024)  Depression (PHQ2-9): Low Risk  (01/24/2024)  Financial Resource Strain: Low Risk  (04/13/2024)  Physical Activity: Inactive (04/13/2024)  Social  Connections: Socially Integrated (04/17/2024)  Recent Concern: Social Connections - Moderately Isolated (04/13/2024)  Stress: No Stress Concern Present (04/13/2024)  Tobacco Use: Medium Risk (04/17/2024)   SDOH Interventions:     Readmission Risk Interventions    03/31/2024   11:48 AM  Readmission Risk Prevention Plan  Transportation Screening Complete  PCP or Specialist Appt within 3-5 Days Complete  HRI or Home Care Consult Complete  Social Work Consult for Recovery Care Planning/Counseling Complete  Palliative Care Screening Not Applicable  Medication Review Oceanographer) Complete

## 2024-04-20 NOTE — Telephone Encounter (Signed)
 PT has been scheduled.

## 2024-04-20 NOTE — Progress Notes (Signed)
 I am [facility]  PROGRESS NOTE  [name] FMW:[id number] DOB: 17-Feb-1951 DOA: 04/17/2024 PCP: Iven Lang DASEN, PA-C   Brief HPI:   73 y.o. male, with a history of COPD on 2 L/min of oxygen  at home chronically, diastolic heart failure with last echo from May 2024 showed EF 60 to 65%, non-small cell lung cancer of right upper lobe, status post SBRT, non-Hodgkin lymphoma, hypothyroidism, atrial fibrillation on Eliquis , hypertension, hyperlipidemia who was recently discharged from the hospital on 04/05/2024 after treated for pneumonia.  Patient says that he was prescribed 2 diuretics both Lasix  and HCTZ..  Patient was not discharged on HCTZ on 04/05/2024, it was started on 04/12/2024.  As per pharmacy he was started on HCTZ by his PCP, Dr. Norleen Fearing in [place]  In the ED patient was found to have abnormal chest x-ray with possible loculated fluid in the right upper lobe. Also required full dose pulmonary of oxygen  via nasal cannula. CT chest without contrast was ordered. Patient started on vancomycin  and cefepime .    Assessment/Plan:    Acute on chronic hypoxemic respiratory failure-presented with worsening shortness of breath, has a history of COPD and recently treated for pneumonia.  Likely from underlying COPD exacerbation and possible pneumonia.  Patient has significant rhonchi and unable to cough up phlegm.  Improved after starting hypertonic saline nebulizer treatments, Mucinex  600 mg p.o. twice daily, Solu-Medrol  40 mg IV twice daily followed by prednisone  40 mg daily.  Procalcitonin 0.45.  f Mucinex  was increased to 1200 mg p.o. twice daily.  Started on IV Unasyn .  PCCM has signed off.  Recommend to discharge home on Cipro  when stable for total 7 days coverage including Unasyn  in the hospital.  Right upper and middle lobe consolidation-seen on CT chest, procalcitonin only 0.45.  He has history of malignancy in the past.  PCCM consulted, CCM added Unasyn  for Acinetobacter.  Will be  discharged on Cipro  for total 7 days of coverage.   Atrial fibrillation-patient has history of atrial fibrillation, currently on Toprol -XL for rate control and Eliquis  for anticoagulation.  Will monitor on telemetry.   Chronic diastolic heart failure-last EF 65% as per echo from 2024.  BNP 331, has been chronically elevated around 300s.  Patient takes Lasix  20 mg daily at home.  Appears euvolemic.  Will hold Lasix  at this time.   Hypothyroidism-continue Synthroid    BPH-continue finasteride    GERD-continue Protonix    Medications     acetylcysteine   3 mL Nebulization QID   albuterol   2.5 mg Nebulization QID   allopurinol   300 mg Oral Daily   amLODipine   5 mg Oral Daily   apixaban   5 mg Oral BID   budesonide -glycopyrrolate -formoterol   2 puff Inhalation BID   Chlorhexidine  Gluconate Cloth  6 each Topical Daily   ezetimibe   10 mg Oral Daily   ferrous sulfate   325 mg Oral Daily   finasteride   5 mg Oral Daily   guaiFENesin   1,200 mg Oral BID   levothyroxine   175 mcg Oral Daily   metoprolol  succinate  25 mg Oral Daily   pantoprazole   40 mg Oral Daily   predniSONE   40 mg Oral Q breakfast   sodium chloride  flush  3 mL Intravenous Q12H     Data Reviewed:   CBG:  No results for input(s): GLUCAP in the last 168 hours.  SpO2: 98 % O2 Flow Rate (L/min): 2 L/min FiO2 (%): 28 %    Vitals:   04/19/24 0929 04/19/24 1923 04/19/24 2004  04/20/24 0304  BP:   125/63 127/64  Pulse:   69 67  Resp:   18 18  Temp:   97.8 F (36.6 C) 97.6 F (36.4 C)  TempSrc:   Oral Oral  SpO2: 97% 97% 97% 98%  Weight:      Height:          Data Reviewed:  Basic Metabolic Panel: Recent Labs  Lab 04/14/24 0829 04/17/24 1409 04/18/24 0401 04/19/24 0350  NA 142 135 135 133*  K 5.0 4.3 4.2 4.5  CL 102 99 99 100  CO2 25 29 26 25   GLUCOSE 104* 99 131* 168*  BUN 27 31* 31* 43*  CREATININE 1.13 1.05 1.22 1.10  CALCIUM  9.3 9.0 8.7* 8.8*  MG  --   --   --  2.1  PHOS  --   --   --  2.7     CBC: Recent Labs  Lab 04/17/24 1409 04/18/24 0401 04/19/24 0350  WBC 12.2* 11.2* 11.1*  HGB 10.7* 10.3* 10.0*  HCT 34.6* 32.9* 32.4*  MCV 89.6 91.1 89.0  PLT 214 197 201    LFT Recent Labs  Lab 04/18/24 0401 04/19/24 0350  AST 19 17  ALT 15 15  ALKPHOS 89 86  BILITOT 1.1 0.8  PROT 6.3* 6.5  ALBUMIN 3.2* 3.1*     Antibiotics: Anti-infectives (From admission, onward)    Start     Dose/Rate Route Frequency Ordered Stop   04/19/24 1400  Ampicillin -Sulbactam (UNASYN ) 3 g in sodium chloride  0.9 % 100 mL IVPB        3 g 200 mL/hr over 30 Minutes Intravenous Every 6 hours 04/19/24 1241     04/18/24 1500  vancomycin  (VANCOREADY) IVPB 1500 mg/300 mL  Status:  Discontinued        1,500 mg 150 mL/hr over 120 Minutes Intravenous Every 24 hours 04/17/24 1727 04/18/24 1411   04/17/24 1500  vancomycin  (VANCOREADY) IVPB 2000 mg/400 mL        2,000 mg 200 mL/hr over 120 Minutes Intravenous  Once 04/17/24 1447 04/17/24 1912   04/17/24 1445  ceFEPIme  (MAXIPIME ) 2 g in sodium chloride  0.9 % 100 mL IVPB  Status:  Discontinued        2 g 200 mL/hr over 30 Minutes Intravenous Every 8 hours 04/17/24 1430 04/19/24 1241        DVT prophylaxis: Apixaban   Code Status: Full code  Family Communication: No family at bedside   CONSULTS PCCM   Subjective   Feels much better this morning.  Ambulated in the hallways without getting oxygen  desaturation.   Objective    Physical Examination:   Physical Exam Constitutional:      General: He is not in acute distress. HENT:     Head: Normocephalic and atraumatic.  Cardiovascular:     Rate and Rhythm: Normal rate and regular rhythm.     Heart sounds: No murmur heard. Pulmonary:     Effort: Pulmonary effort is normal.     Breath sounds: Examination of the right-middle field reveals wheezing. Examination of the left-middle field reveals wheezing. Examination of the right-lower field reveals wheezing. Examination of the  left-lower field reveals wheezing. Wheezing present. No rhonchi.  Abdominal:     Palpations: Abdomen is soft. There is no splenomegaly.     Tenderness: There is no abdominal tenderness.  Musculoskeletal:     Right lower leg: No edema.     Left lower leg: No edema.  Skin:  General: Skin is warm and dry.  Neurological:     General: No focal deficit present.     Mental Status: He is alert and oriented to person, place, and time.  Psychiatric:        Mood and Affect: Mood normal.        Behavior: Behavior normal.       Status is: Inpatient:             Sabas GORMAN Brod   [facility] If 7PM-7AM, please contact night-coverage at [email or web address], Office  [phone]   04/20/2024, 8:03 AM  LOS: 3 days

## 2024-04-20 NOTE — Telephone Encounter (Signed)
 Needs hospital follow up for pneumonia 2-3 weeks

## 2024-04-20 NOTE — Consult Note (Signed)
 NAME:  [name], MRN:  [id number], DOB:  May 13, 1951, LOS: 3 ADMISSION DATE:  04/17/2024, CONSULTATION DATE:  04/18/24 REFERRING MD:  Dr DRUSILLA, CHIEF COMPLAINT:  Acute on chronic respiratory fialure    History of Present Illness:  73 year old patient with severe COPD.  Also atrial fibrillation on Eliquis .  Of note and for many years since her original start of practice in 2008 but he reestablished in October 2024.  He has a remote history of non-Hodgkin's lymphoma.  His oncology history is detailed below and is on observation therapy currently by Dr. Sherrod.  On his most recent CT chest May 2025 prior to current hospitalizations he had small 4 mm nodule on the left lower lobe.  1 year follow-up was recommended.  He saw Dr. Geronimo for pulmonary office follow-up in March 21 2024 and was in stable health apart from a history of frequent COPD exacerbations for which biologic therapy was being entertained [he did not get his CBC done].  At baseline uses 2 L nasal cannula at night  He then was admitted 03/30/2024 - 04/02/2024 with worsening shortness of breath following working in his garage all day.  Wife also reported that prior to visit he was using a lot of weed killer and getting exposed to a lot of dust in the garage.  He was hypoxemic to the 80s streaky hemoptysis.  Treated with antibiotics and prednisone  taper and discharged.  Multiplex PCR panel negative for respiratory viruses. His CT chest this asmit showed:  Interim since 2 months ago. development of heterogeneous airspace disease in the right middle and left greater than right lower lobes, suspicious for multifocal pneumonia. Trace left-sided pleural effusio  Then readmitted 04/17/2024 with 1 day history of fever and feeling dry and difficulty coughing up sputum.  Started on vancomycin  and Zosyn . CT chest this admit: C. Extensive airspace disease with air bronchograms in the left lower lobe with less pronounced nodular airspace disease in the  left upper lobe and right lower lobe. Findings compatible with multifocal pneumonia. He is on Vanc and Cefepime .    On 7/29 - [facility]  reports afebrile and normal procal but congested cough.SABRASABRAPt continues to produce purulent (pink to milky creamy pus-like) sputum. And ccm consulted.  PAteient repots that between first and 2nd admit he was given new hydrochlorothiazide  but it made him feel dehydrated.  And this then made very difficult for him to be no sputum and then made him hypoxemic.  He denies any dysphagia.  Nursing reports good swallowing at the bedside.  CCM comsilteed    Past Medical History:  1) stage IA (T1c, N0, M0) non-small cell lung cancer, squamous cell carcinoma diagnosed in January 2021 2) Stage III large B-cell non-Hodgkin lymphoma diagnosed in December 2016 and presented with extensive lymphadenopathy involving the neck, chest, abdomen as well as splenomegaly.    PRIOR THERAPY:  1) Systemic chemotherapy with the CHOP/Rituxan  every 3 weeks with Neulasta  support. Status post 6 cycles. 2) SBRT to the right upper lobe squamous cell carcinoma under the care of Dr. Shannon in February 202  has a past medical history of Abnormal finding on GI tract imaging (09/07/2023), Acute diastolic CHF (congestive heart failure) (HCC) (05/07/2023), Acute on chronic diastolic CHF (congestive heart failure) (HCC) (05/06/2023), Acute on chronic respiratory failure with hypoxia (HCC) (05/06/2023), Aortic atherosclerosis (HCC) (01/26/2023), Arthritis, Atrial fibrillation (HCC) (10/26/2011), Benign neoplasm of colon (09/07/2023), BPH (benign prostatic hyperplasia) (2011), BRONCHOPNEUMONIA ORGANISM UNSPECIFIED (06/12/2010), Carpal tunnel syndrome, right upper limb (  11/03/2022), Chronic respiratory failure with hypoxia (HCC) (08/27/2015), Congestive heart failure (HCC), COPD (chronic obstructive pulmonary disease) (HCC), COPD with acute exacerbation (HCC) (05/26/2023), Coronary artery calcification  (01/26/2023), Cubital tunnel syndrome on right (07/09/2023), Depression, DOE (dyspnea on exertion) (04/07/2023), Essential hypertension (06/05/2008), GERD (gastroesophageal reflux disease), Hypothyroidism (03/30/2012), Lesion of ulnar nerve, right upper limb (11/03/2022), Lymphadenopathy syndrome (09/12/2015), Mediastinal adenopathy (09/12/2015), NHL (non-Hodgkin's lymphoma) (HCC) (09/25/2015), Numbness of right hand (06/28/2022), Obesity, Open wound of right hand without foreign body (07/27/2021), Other dysphagia (11/11/2010), Polycythemia (01/26/2023), Port catheter in place (08/20/2016), Pulmonary hypertension (HCC), Pure hypercholesterolemia (12/03/2011), Recurrent pneumonia (02/10/2012), Routine general medical examination at a health care facility (12/03/2011), Squamous cell carcinoma lung (HCC) (09/26/2010), Status post bronchoscopy with biopsy, Stiffness of finger joint of right hand (09/24/2021), and Testicular swelling (09/12/2015).   reports that he quit smoking about 22 years ago. His smoking use included cigarettes. He started smoking about 65 years ago. He has a 129 pack-year smoking history. He has been exposed to tobacco smoke. He has never used smokeless tobacco.  Past Surgical History:  Procedure Laterality Date   APPENDECTOMY     BACK SURGERY     CARDIOVERSION  11/27/2011   Procedure: CARDIOVERSION;  Surgeon: Aleene JINNY Passe, MD;  Location: [facility] ENDOSCOPY;  Service: Cardiovascular;  Laterality: N/A;   COLONOSCOPY     COLONOSCOPY WITH PROPOFOL  N/A 09/07/2023   Procedure: COLONOSCOPY WITH PROPOFOL ;  Surgeon: Leigh Elspeth SQUIBB, MD;  Location: [facility] ENDOSCOPY;  Service: Gastroenterology;  Laterality: N/A;   FUDUCIAL PLACEMENT Right 10/13/2019   Procedure: Placement Of Fuducial;  Surgeon: Shelah Lamar RAMAN, MD;  Location[facility] OR;  Service: Thoracic;  Laterality: Right;  Right Upper Lobe    LEFT HEART CATH AND CORONARY ANGIOGRAPHY N/A 04/12/2023   Procedure: LEFT HEART CATH AND CORONARY  ANGIOGRAPHY;  Surgeon: Anner Alm ORN, MD;  Location: [facility] INVASIVE CV LAB;  Service: Cardiovascular;  Laterality: N/A;   LUNG CANCER SURGERY  2011   POLYPECTOMY  09/07/2023   Procedure: POLYPECTOMY;  Surgeon: Leigh Elspeth SQUIBB, MD;  Location: [facility] ENDOSCOPY;  Service: Gastroenterology;;   UPPER GI ENDOSCOPY     VIDEO BRONCHOSCOPY WITH ENDOBRONCHIAL NAVIGATION N/A 10/13/2019   Procedure: VIDEO BRONCHOSCOPY WITH ENDOBRONCHIAL NAVIGATION;  Surgeon: Shelah Lamar RAMAN, MD;  Location: [facility] OR;  Service: Thoracic;  Laterality: N/A;   VIDEO BRONCHOSCOPY WITH ENDOBRONCHIAL ULTRASOUND N/A 10/13/2019   Procedure: VIDEO BRONCHOSCOPY WITH ENDOBRONCHIAL ULTRASOUND;  Surgeon: Shelah Lamar RAMAN, MD;  Location: [facility] OR;  Service: Thoracic;  Laterality: N/A;    Allergies  Allergen Reactions   Hydrochlorothiazide  Other (See Comments)    Dried out the patient too much   Lipitor [Atorvastatin ] Other (See Comments)    Myalgias  Weakness  Fatigue    Omnipaque  [Iohexol ] Itching and Other (See Comments)    When asked about contrast media today, pt stated that last time he had the contrast that he was red all over and itchy from head to toe.    Tape Other (See Comments)    Band-Aids irritate the skin    Immunization History  Administered Date(s) Administered   Fluad Trivalent(High Dose 65+) 05/28/2023   Influenza Whole 06/21/2009, 08/30/2011   Influenza, High Dose Seasonal PF 06/10/2017, 07/27/2018, 06/22/2019   Influenza-Unspecified 09/16/2015, 05/13/2016   PNEUMOCOCCAL CONJUGATE-20 03/03/2021   Pneumococcal Conjugate-13 08/06/2017   Pneumococcal Polysaccharide-23 05/22/2008   Tdap 12/03/2011, 12/23/2021    Family History  Problem Relation Age of Onset   Heart disease Father    Cancer Neg Hx  Diabetes Neg Hx    Stroke Neg Hx    Hypertension Neg Hx    Kidney disease Neg Hx      Current Facility-Administered Medications:    acetylcysteine  (MUCOMYST ) 20 % nebulizer / oral solution 3 mL, 3 mL,  Nebulization, QID, Geronimo, [name], MD, 3 mL at 04/20/24 1232   albuterol  (PROVENTIL ) (2.5 MG/3ML) 0.083% nebulizer solution 2.5 mg, 2.5 mg, Nebulization, Q4H PRN, Antoine Fiallos S, MD   albuterol  (PROVENTIL ) (2.5 MG/3ML) 0.083% nebulizer solution 2.5 mg, 2.5 mg, Nebulization, QID, Macaiah Mangal S, MD, 2.5 mg at 04/20/24 1232   allopurinol  (ZYLOPRIM ) tablet 300 mg, 300 mg, Oral, Daily, Drusilla, Gagan S, MD, 300 mg at 04/20/24 9192   amLODipine  (NORVASC ) tablet 5 mg, 5 mg, Oral, Daily, Drusilla, Sabas RAMAN, MD, 5 mg at 04/20/24 9193   Ampicillin -Sulbactam (UNASYN ) 3 g in sodium chloride  0.9 % 100 mL IVPB, 3 g, Intravenous, Q6H, Meade Verdon RAMAN, MD, Last Rate: 200 mL/hr at 04/20/24 0805, 3 g at 04/20/24 0805   apixaban  (ELIQUIS ) tablet 5 mg, 5 mg, Oral, BID, Kyere, Belinda K, NP, 5 mg at 04/20/24 9192   budesonide -glycopyrrolate -formoterol  (BREZTRI ) 160-9-4.8 MCG/ACT inhaler 2 puff, 2 puff, Inhalation, BID, Drusilla, Sabas RAMAN, MD, 2 puff at 04/20/24 0902   Chlorhexidine  Gluconate Cloth 2 % PADS 6 each, 6 each, Topical, Daily, Drusilla Sabas RAMAN, MD, 6 each at 04/19/24 1816   ezetimibe  (ZETIA ) tablet 10 mg, 10 mg, Oral, Daily, Drusilla, Sabas RAMAN, MD, 10 mg at 04/20/24 9193   ferrous sulfate  tablet 325 mg, 325 mg, Oral, Daily, Drusilla, Sabas RAMAN, MD, 325 mg at 04/20/24 9193   finasteride  (PROSCAR ) tablet 5 mg, 5 mg, Oral, Daily, Drusilla, Sabas RAMAN, MD, 5 mg at 04/20/24 9193   guaiFENesin  (MUCINEX ) 12 hr tablet 1,200 mg, 1,200 mg, Oral, BID, Drusilla, Gagan S, MD, 1,200 mg at 04/20/24 9192   levothyroxine  (SYNTHROID ) tablet 175 mcg, 175 mcg, Oral, Daily, Drusilla Sabas RAMAN, MD, 175 mcg at 04/20/24 0541   metoprolol  succinate (TOPROL -XL) 24 hr tablet 25 mg, 25 mg, Oral, Daily, Drusilla, Sabas RAMAN, MD, 25 mg at 04/20/24 9193   ondansetron  (ZOFRAN ) tablet 4 mg, 4 mg, Oral, Q6H PRN **OR** ondansetron  (ZOFRAN ) injection 4 mg, 4 mg, Intravenous, Q6H PRN, Drusilla, Sabas RAMAN, MD   pantoprazole  (PROTONIX ) EC tablet 40 mg, 40 mg, Oral, Daily, Drusilla, Gagan S, MD, 40 mg  at 04/20/24 0806   [COMPLETED] methylPREDNISolone  sodium succinate  (SOLU-MEDROL ) 40 mg/mL injection 40 mg, 40 mg, Intravenous, Q12H, 40 mg at 04/18/24 0635 **FOLLOWED BY** predniSONE  (DELTASONE ) tablet 40 mg, 40 mg, Oral, Q breakfast, Drusilla, Gagan S, MD, 40 mg at 04/20/24 9192   sodium chloride  flush (NS) 0.9 % injection 10-40 mL, 10-40 mL, Intracatheter, PRN, Drusilla, Sabas RAMAN, MD   sodium chloride  flush (NS) 0.9 % injection 3 mL, 3 mL, Intravenous, Q12H, Drusilla, Gagan S, MD, 3 mL at 04/20/24 9187   sodium chloride  flush (NS) 0.9 % injection 3 mL, 3 mL, Intravenous, PRN, Drusilla Sabas RAMAN, MD     Significant Hospital Events:  04/17/2024 - admit  Interim History / Subjective:   Ambulated the halls 8 times today, did not desaturate. He and his wife both feel he is doing better. Currently getting breathing treatment. Was able to bring up mucus.   Objective   Blood pressure 136/76, pulse 67, temperature 97.6 F (36.4 C), temperature source Oral, resp. rate 18, height 6' (1.829 m), weight 91.6 kg, SpO2 98%.  Intake/Output Summary (Last 24 hours) at 04/20/2024 1254 Last data filed at 04/20/2024 0544 Gross per 24 hour  Intake 300 ml  Output 2000 ml  Net -1700 ml   Filed Weights   04/17/24 2057  Weight: 91.6 kg    Examination: Elderly, currently receiving breathing treatment Breath sounds diminished bilaterally, few end expiratory wheezes Irregularly irregular No le edema Normal speech, no focal asymmetry  Echocardiogram shows preserved LVEF, no pulmonary hypertension Respiratory virus panel negative CT Chest compared from July 10 to 7/28 show persistent to worsening LLL consolidation, stable RUL changes. Centrilobular emphysema Expectorated sputum culture from 7/30 is no growth so far.   Resolved Hospital Problem list   x  Assessment & Plan:   PULMONARY  A:  Acute on chronic hypoxemic respiratory failure -secondary to left lower lobe right middle lobe consolidation   Acinetobacter cultures positive  P:   Continue unasyn  for pneumonia for acinetobacter coverage. He could tentatively go home as soon as tomorrow If he is feeling well enough. Already has POC for home use if needed. Would transition to cipro  at discharge for total 7 days coverage including what he received inpatient. Discussed with pharmacy that augmentin  is not ideal therapy for acinetobacter.  Continue oxygen , wean for goal sats over 90% Continue bronchodilators and mucomyst . Stop duoneb as he is already on muscarinic agonist with the breztri . Prn albuterol  nebs at discharge instead (order placed.) will need outpatient follow up with our office, and will need repeat imaging to ensure resolution of the pneumonia in 6-8 weeks. Continue guaifenisen, agree with increased dose Continue prednisone  for severe CAP  Pccm will see as needed. Please call if questions Verdon Gore, MD Pulmonary and Critical Care Medicine [facility] 04/20/2024 12:57 PM Pager: see AMION  If no response to pager, please call critical care on call (see AMION) until 7pm After 7:00 pm call Elink     LABS    PULMONARY No results for input(s): PHART, PCO2ART, PO2ART, HCO3, TCO2, O2SAT in the last 168 hours.  Invalid input(s): PCO2, PO2  CBC Recent Labs  Lab 04/17/24 1409 04/18/24 0401 04/19/24 0350  HGB 10.7* 10.3* 10.0*  HCT 34.6* 32.9* 32.4*  WBC 12.2* 11.2* 11.1*  PLT 214 197 201    COAGULATION No results for input(s): INR in the last 168 hours.  CARDIAC  No results for input(s): TROPONINI in the last 168 hours. No results for input(s): PROBNP in the last 168 hours.  CHEMISTRY Recent Labs  Lab 04/14/24 0829 04/17/24 1409 04/18/24 0401 04/19/24 0350  NA 142 135 135 133*  K 5.0 4.3 4.2 4.5  CL 102 99 99 100  CO2 25 29 26 25   GLUCOSE 104* 99 131* 168*  BUN 27 31* 31* 43*  CREATININE 1.13 1.05 1.22 1.10  CALCIUM  9.3 9.0 8.7* 8.8*  MG  --   --   --  2.1  PHOS   --   --   --  2.7   Estimated Creatinine Clearance: 65.6 mL/min (by C-G formula based on SCr of 1.1 mg/dL).   LIVER Recent Labs  Lab 04/18/24 0401 04/19/24 0350  AST 19 17  ALT 15 15  ALKPHOS 89 86  BILITOT 1.1 0.8  PROT 6.3* 6.5  ALBUMIN 3.2* 3.1*     INFECTIOUS Recent Labs  Lab 04/17/24 1502 04/17/24 1738 04/17/24 2059  LATICACIDVEN 0.4* 0.8  --   PROCALCITON  --   --  0.45     ENDOCRINE CBG (last 3)  No  results for input(s): GLUCAP in the last 72 hours.

## 2024-04-21 ENCOUNTER — Encounter: Payer: Self-pay | Admitting: Internal Medicine

## 2024-04-21 ENCOUNTER — Other Ambulatory Visit (HOSPITAL_COMMUNITY): Payer: Self-pay

## 2024-04-21 DIAGNOSIS — R0602 Shortness of breath: Secondary | ICD-10-CM | POA: Diagnosis not present

## 2024-04-21 DIAGNOSIS — J441 Chronic obstructive pulmonary disease with (acute) exacerbation: Secondary | ICD-10-CM | POA: Diagnosis not present

## 2024-04-21 DIAGNOSIS — J189 Pneumonia, unspecified organism: Secondary | ICD-10-CM | POA: Diagnosis not present

## 2024-04-21 MED ORDER — GUAIFENESIN ER 600 MG PO TB12
1200.0000 mg | ORAL_TABLET | Freq: Two times a day (BID) | ORAL | 0 refills | Status: AC | PRN
  Filled 2024-04-21: qty 20, 5d supply, fill #0

## 2024-04-21 MED ORDER — PREDNISONE 10 MG PO TABS
ORAL_TABLET | ORAL | 0 refills | Status: DC
  Filled 2024-04-21: qty 10, 4d supply, fill #0

## 2024-04-21 MED ORDER — ALBUTEROL SULFATE (2.5 MG/3ML) 0.083% IN NEBU
2.5000 mg | INHALATION_SOLUTION | Freq: Four times a day (QID) | RESPIRATORY_TRACT | 12 refills | Status: DC | PRN
  Filled 2024-04-21: qty 75, 7d supply, fill #0

## 2024-04-21 MED ORDER — CIPROFLOXACIN HCL 500 MG PO TABS
500.0000 mg | ORAL_TABLET | Freq: Two times a day (BID) | ORAL | 0 refills | Status: AC
  Filled 2024-04-21: qty 10, 5d supply, fill #0

## 2024-04-21 MED ORDER — BUDESON-GLYCOPYRROL-FORMOTEROL 160-9-4.8 MCG/ACT IN AERO
2.0000 | INHALATION_SPRAY | Freq: Two times a day (BID) | RESPIRATORY_TRACT | 0 refills | Status: DC
  Filled 2024-04-21: qty 10.7, 30d supply, fill #0

## 2024-04-21 MED ORDER — HEPARIN SOD (PORK) LOCK FLUSH 100 UNIT/ML IV SOLN
500.0000 [IU] | INTRAVENOUS | Status: AC | PRN
  Administered 2024-04-21: 500 [IU]

## 2024-04-21 NOTE — Discharge Summary (Addendum)
 Physician Discharge Summary   Patient: [name] MRN: [id number] DOB: 09/19/1951  Admit date:     04/17/2024  Discharge date: 04/21/24  Discharge Physician: Sabas GORMAN Brod   PCP: Iven Lang DASEN, PA-C   Recommendations at discharge:   Follow-up pulmonology as outpatient Follow-up PCP as outpatient Continue Cipro  500 mg p.o. twice daily for 5 more days  Discharge Diagnoses: Principal Problem:   COPD exacerbation (HCC)  Resolved Problems:   * No resolved hospital problems. [facility]: 73 y.o. male, with a history of COPD on 2 L/min of oxygen  at home chronically, diastolic heart failure with last echo from May 2024 showed EF 60 to 65%, non-small cell lung cancer of right upper lobe, status post SBRT, non-Hodgkin lymphoma, hypothyroidism, atrial fibrillation on Eliquis , hypertension, hyperlipidemia who was recently discharged from the hospital on 04/05/2024 after treated for pneumonia.  Patient says that he was prescribed 2 diuretics both Lasix  and HCTZ..  Patient was not discharged on HCTZ on 04/05/2024, it was started on 04/12/2024.  As per pharmacy he was started on HCTZ by his PCP, Dr. Norleen Fearing in [place]  In the ED patient was found to have abnormal chest x-ray with possible loculated fluid in the right upper lobe. Also required full dose pulmonary of oxygen  via nasal cannula. CT chest without contrast was ordered. Patient started on vancomycin  and cefepime .  Assessment and Plan:  Acute on chronic hypoxemic respiratory failure-resolved, has been ambulating in the hallways without oxygen  desaturation.  Presented with worsening shortness of breath, has a history of COPD and recently treated for pneumonia.  Likely from underlying COPD exacerbation and possible pneumonia.  Patient has significant rhonchi and unable to cough up phlegm.  Improved after starting hypertonic saline nebulizer treatments, Mucinex  600 mg p.o. twice daily, Solu-Medrol  40 mg IV twice daily followed by  prednisone  40 mg daily.  Procalcitonin 0.45.  f Mucinex  was increased to 1200 mg p.o. twice daily.  Started on IV Unasyn .  PCCM has signed off.  Recommend to discharge home on Cipro  when stable for total 7 days coverage including Unasyn  in the hospital.  Will discharge on Cipro  500 mg p.o. twice daily for 5 more days, continue Mucinex  1200 mg p.o. twice daily as needed for increased phlegm.  Will discharge on prednisone  taper for 5 more days.   Right upper and middle lobe consolidation-seen on CT chest, procalcitonin only 0.45.  He has history of malignancy in the past.  PCCM consulted, CCM added Unasyn  for Acinetobacter.  Will be discharged on Cipro  for 5 more days   Atrial fibrillation-patient has history of atrial fibrillation, currently on Toprol -XL for rate control and Eliquis  for anticoagulation.     Chronic diastolic heart failure-last EF 65% as per echo from 2024.  BNP 331, has been chronically elevated around 300s.  Patient takes Lasix  20 mg daily at home.  Appears euvolemic.  Will continue Lasix  20 mg p.o. daily and discontinue hydrochlorothiazide .   Hypothyroidism-continue Synthroid    BPH-continue finasteride    GERD-continue Protonix           Consultants: Pulmonology Procedures performed:  Disposition: Home Diet recommendation:  Discharge Diet Orders (From admission, onward)     Start     Ordered   04/21/24 0000  Diet - low sodium heart healthy        04/21/24 1127           Cardiac diet DISCHARGE MEDICATION: Allergies as of 04/21/2024       Reactions  Hydrochlorothiazide  Other (See Comments)   Dried out the patient too much   Lipitor [atorvastatin ] Other (See Comments)   Myalgias  Weakness  Fatigue    Omnipaque  [iohexol ] Itching, Other (See Comments)   When asked about contrast media today, pt stated that last time he had the contrast that he was red all over and itchy from head to toe.    Tape Other (See Comments)   Band-Aids irritate the skin         Medication List     STOP taking these medications    hydrochlorothiazide  12.5 MG tablet Commonly known as: HYDRODIURIL    ipratropium-albuterol  0.5-2.5 (3) MG/3ML Soln Commonly known as: DUONEB   lisinopril  10 MG tablet Commonly known as: ZESTRIL    Trelegy Ellipta 100-62.5-25 MCG/INH Aepb Generic drug: Fluticasone -Umeclidin-Vilant Replaced by: budesonide -glycopyrrolate -formoterol  160-9-4.8 MCG/ACT Aero inhaler       TAKE these medications    albuterol  108 (90 Base) MCG/ACT inhaler Commonly known as: VENTOLIN  HFA Inhale 2 puffs into the lungs every 6 (six) hours as needed for wheezing or shortness of breath. What changed: Another medication with the same name was removed. Continue taking this medication, and follow the directions you see here.   albuterol  (2.5 MG/3ML) 0.083% nebulizer solution Commonly known as: PROVENTIL  Take 3 mLs (2.5 mg total) by nebulization every 6 (six) hours as needed for wheezing or shortness of breath. What changed: Another medication with the same name was removed. Continue taking this medication, and follow the directions you see here.   allopurinol  300 MG tablet Commonly known as: ZYLOPRIM  Take 300 mg by mouth daily.   amLODipine  5 MG tablet Commonly known as: NORVASC  Take 5 mg by mouth daily.   betamethasone  (augmented) 0.05 % lotion Commonly known as: DIPROLENE  Apply 1 Application topically daily as needed (dry skin on scalp).   budesonide -glycopyrrolate -formoterol  160-9-4.8 MCG/ACT Aero inhaler Commonly known as: BREZTRI  Inhale 2 puffs into the lungs 2 (two) times daily. Replaces: Trelegy Ellipta 100-62.5-25 MCG/INH Aepb   Buprenorphine HCl-Naloxone HCl 8-2 MG Film Place 0.33-1 Film under the tongue 2 (two) times daily as needed (back pain).   ciclopirox  8 % solution Commonly known as: PENLAC  Apply topically at bedtime. Apply a thin coat over nail. Apply daily over previous coat. Remove weekly with nail polish remover. What  changed:  how much to take when to take this additional instructions   ciprofloxacin  500 MG tablet Commonly known as: Cipro  Take 1 tablet (500 mg total) by mouth 2 (two) times daily for 5 days.   Eliquis  5 MG Tabs tablet Generic drug: apixaban  Take 1 tablet by mouth twice daily What changed: when to take this   ezetimibe  10 MG tablet Commonly known as: ZETIA  Take 10 mg by mouth daily.   finasteride  5 MG tablet Commonly known as: PROSCAR  Take 5 mg by mouth daily.   furosemide  20 MG tablet Commonly known as: LASIX  Take 1 tablet by mouth once daily What changed: when to take this   guaiFENesin  600 MG 12 hr tablet Commonly known as: MUCINEX  Take 2 tablets (1,200 mg total) by mouth 2 (two) times daily as needed for up to 15 days for to loosen phlegm.   Iron  325 (65 Fe) MG Tabs Take 1 tablet (325 mg total) by mouth daily. What changed: when to take this   levothyroxine  175 MCG tablet Commonly known as: SYNTHROID  Take 175 mcg by mouth daily before breakfast.   Linzess  290 MCG Caps capsule Generic drug: linaclotide  Take 1 capsule (290  mcg total) by mouth daily before breakfast.   Magnesium  Chloride 64 MG Tabs Take 64 mg by mouth daily in the afternoon.   metoprolol  succinate 25 MG 24 hr tablet Commonly known as: Toprol  XL Take 1 tablet (25 mg total) by mouth daily.   omeprazole  20 MG capsule Commonly known as: PRILOSEC Take 1 capsule (20 mg total) by mouth 2 (two) times daily.   ondansetron  4 MG disintegrating tablet Commonly known as: ZOFRAN -ODT Take 1 tablet (4 mg total) by mouth every 8 (eight) hours as needed.   OXYGEN  Inhale 2 L/min into the lungs See admin instructions. Inhale 2 L/min into the lungs at bedtime and as needed for shortness of breath during the daytime   predniSONE  10 MG tablet Commonly known as: DELTASONE  Prednisone  40 mg po daily x 1 day then Prednisone  30 mg po daily x 1 day then Prednisone  20 mg po daily x 1 day then Prednisone  10 mg  daily x 1 day then stop...   sodium chloride  0.65 % Soln nasal spray Commonly known as: OCEAN Place 1 spray into both nostrils as needed for congestion.   zolpidem  10 MG tablet Commonly known as: AMBIEN  Take 10 mg by mouth at bedtime as needed for sleep.        Follow-up Information     [name], Lang DASEN, PA-C Follow up in 1 week(s).   Specialty: Physician Assistant Contact information: [address] [place] [address] [phone]         Meade Verdon RAMAN, MD. Schedule an appointment as soon as possible for a visit.   Specialty: Pulmonary Disease Contact information: [address] [place] [address] [phone]                Discharge Exam: Fredricka Weights   04/17/24 2057  Weight: 91.6 kg   General-appears in no acute distress Heart-S1-S2, regular, no murmur auscultated Lungs-scattered wheezing bilaterally Abdomen-soft, nontender, no organomegaly Extremities-no edema in the lower extremities Neuro-alert, oriented x3, no focal deficit noted  Condition at discharge: good  The results of significant diagnostics from this hospitalization (including imaging, microbiology, ancillary and laboratory) are listed below for reference.   Imaging Studies: ECHOCARDIOGRAM COMPLETE Result Date: 04/18/2024    ECHOCARDIOGRAM REPORT   Patient Name:   JOE GEE Date of Exam: 04/18/2024 Medical Rec #:  [id number]      Height:       72.0 in Accession #:    [id number]     Weight:       202.0 lb Date of Birth:  April 21, 1951     BSA:          2.140 m Patient Age:    72 years       BP:           126/65 mmHg Patient Gender: M              HR:           77 bpm. Exam Location:  Inpatient Procedure: 2D Echo, Intracardiac Opacification Agent, Color Doppler and Cardiac            Doppler (Both Spectral and Color Flow Doppler were utilized during            procedure). Indications:    Acute Respiratory distress R06.03  History:        Patient has prior history of Echocardiogram  examinations, most  recent 02/17/2023. CHF, Pulmonary HTN and COPD, Arrythmias:Atrial                 Fibrillation, Signs/Symptoms:Shortness of Breath and Dyspnea;                 Risk Factors:Hypertension.  Sonographer:    Koleen Popper RDCS Referring Phys: [phone] [facility]  Sonographer Comments: Suboptimal apical window. IMPRESSIONS  1. Left ventricular ejection fraction, by estimation, is 60 to 65%. The left ventricle has normal function. The left ventricle has no regional wall motion abnormalities. Left ventricular diastolic parameters are indeterminate.  2. Right ventricular systolic function is hyperdynamic. The right ventricular size is mildly enlarged. Tricuspid regurgitation signal is inadequate for assessing PA pressure.  3. Left atrial size was mildly dilated.  4. Right atrial size was mildly dilated.  5. The mitral valve is normal in structure. No evidence of mitral valve regurgitation. No evidence of mitral stenosis.  6. The aortic valve was not well visualized. Aortic valve regurgitation is not visualized. No aortic stenosis is present.  7. The inferior vena cava is normal in size with <50% respiratory variability, suggesting right atrial pressure of 8 mmHg. Comparison(s): No significant change from prior study. Prior images reviewed side by side. FINDINGS  Left Ventricle: Left ventricular ejection fraction, by estimation, is 60 to 65%. The left ventricle has normal function. The left ventricle has no regional wall motion abnormalities. Definity  contrast agent was given IV to delineate the left ventricular  endocardial borders. The left ventricular internal cavity size was normal in size. There is no left ventricular hypertrophy. Left ventricular diastolic parameters are indeterminate. Right Ventricle: The right ventricular size is mildly enlarged. No increase in right ventricular wall thickness. Right ventricular systolic function is hyperdynamic. Tricuspid regurgitation signal is  inadequate for assessing PA pressure. Left Atrium: Left atrial size was mildly dilated. Right Atrium: Right atrial size was mildly dilated. Pericardium: There is no evidence of pericardial effusion. Mitral Valve: The mitral valve is normal in structure. No evidence of mitral valve regurgitation. No evidence of mitral valve stenosis. Tricuspid Valve: The tricuspid valve is normal in structure. Tricuspid valve regurgitation is not demonstrated. No evidence of tricuspid stenosis. Aortic Valve: The aortic valve was not well visualized. Aortic valve regurgitation is not visualized. No aortic stenosis is present. Pulmonic Valve: The pulmonic valve was not well visualized. Pulmonic valve regurgitation is not visualized. No evidence of pulmonic stenosis. Aorta: The aortic root is normal in size and structure. Venous: The inferior vena cava is normal in size with less than 50% respiratory variability, suggesting right atrial pressure of 8 mmHg. IAS/Shunts: The atrial septum is grossly normal.  LEFT VENTRICLE PLAX 2D LVIDd:         5.00 cm   Diastology LVIDs:         3.10 cm   LV e' medial:    4.24 cm/s LV PW:         1.20 cm   LV E/e' medial:  28.8 LV IVS:        1.00 cm   LV e' lateral:   14.60 cm/s LVOT diam:     2.00 cm   LV E/e' lateral: 8.4 LV SV:         70 LV SV Index:   33 LVOT Area:     3.14 cm  RIGHT VENTRICLE             IVC RV Basal diam:  4.10 cm  IVC diam: 3.00 cm RV Mid diam:    3.70 cm RV S prime:     11.40 cm/s TAPSE (M-mode): 2.3 cm LEFT ATRIUM             Index        RIGHT ATRIUM           Index LA diam:        4.10 cm 1.92 cm/m   RA Area:     24.90 cm LA Vol (A2C):   93.9 ml 43.89 ml/m  RA Volume:   86.40 ml  40.38 ml/m LA Vol (A4C):   80.1 ml 37.44 ml/m LA Biplane Vol: 90.6 ml 42.34 ml/m  AORTIC VALVE LVOT Vmax:   105.00 cm/s LVOT Vmean:  65.800 cm/s LVOT VTI:    0.224 m  AORTA Ao Root diam: 3.20 cm MITRAL VALVE MV Area (PHT): 3.99 cm     SHUNTS MV Decel Time: 190 msec     Systemic VTI:  0.22  m MV E velocity: 122.00 cm/s  Systemic Diam: 2.00 cm MV A velocity: 56.90 cm/s MV E/A ratio:  2.14 Stanly Leavens MD Electronically signed by Stanly Leavens MD Signature Date/Time: 04/18/2024/2:53:44 PM    Final    CT Chest Wo Contrast Result Date: 04/17/2024 CLINICAL DATA:  Shortness of breath, fever.  Evaluate for pneumonia. EXAM: CT CHEST WITHOUT CONTRAST TECHNIQUE: Multidetector CT imaging of the chest was performed following the standard protocol without IV contrast. RADIATION DOSE REDUCTION: This exam was performed according to the departmental dose-optimization program which includes automated exposure control, adjustment of the mA and/or kV according to patient size and/or use of iterative reconstruction technique. COMPARISON:  Chest x-ray today.  Chest CT 03/30/2024. FINDINGS: Cardiovascular: Heart is normal size. Aorta is normal caliber. Coronary artery and aortic atherosclerosis. Mediastinum/Nodes: Mildly enlarged AP window and prevascular lymph nodes, stable, likely reactive. Trachea and esophagus are unremarkable. Thyroid  unremarkable. Lungs/Pleura: Chronic right apical density surrounding fiducial markers, presumably postradiation changes/scarring, unchanged. Extensive airspace disease with air bronchograms in the left lower lobe with less pronounced nodular airspace disease in the left upper lobe and right lower lobe. Findings compatible with multifocal pneumonia. No effusions. Upper Abdomen: No acute findings Musculoskeletal: Chest wall soft tissues are unremarkable. Right chest wall Port-A-Cath remains in place, unchanged. No acute bony abnormality. IMPRESSION: Dense consolidation in the left lower lobe with nodular airspace disease in the left upper lobe and right lower lobe. Findings compatible with multifocal pneumonia. Coronary artery disease. Aortic Atherosclerosis (ICD10-I70.0). Electronically Signed   By: Franky Crease M.D.   On: 04/17/2024 20:16   DG Chest 2 View Result Date:  04/17/2024 CLINICAL DATA:  Short of breath EXAM: CHEST - 2 VIEW COMPARISON:  05/26/2019 FINDINGS: Port in the anterior chest wall with tip in distal SVC. Chronic loculated fluid at the RIGHT lung apex. LEFT basilar atelectasis similar prior. No pneumothorax. No change from comparison exam. IMPRESSION: 1. No acute cardiopulmonary process. 2. Chronic loculated fluid at the RIGHT lung apex. 3. LEFT basilar atelectasis. Electronically Signed   By: Jackquline Boxer M.D.   On: 04/17/2024 14:08   CT Chest Wo Contrast Result Date: 03/30/2024 CLINICAL DATA:  COPD lung cancer low oxygen  saturation EXAM: CT CHEST WITHOUT CONTRAST TECHNIQUE: Multidetector CT imaging of the chest was performed following the standard protocol without IV contrast. RADIATION DOSE REDUCTION: This exam was performed according to the departmental dose-optimization program which includes automated exposure control, adjustment of the mA and/or kV according to patient size  and/or use of iterative reconstruction technique. COMPARISON:  CT 02/15/2024, 08/20/2023, 01/23/2023, PET CT 02/12/2023 FINDINGS: Cardiovascular: Limited evaluation without intravenous contrast. Moderate aortic atherosclerosis. No aneurysm. Dilated pulmonary trunk up to 4.8 cm and enlarged central pulmonary vessels consistent with arterial hypertension. Coronary vascular calcification. Right-sided central venous catheter with tip in the SVC. Trace pericardial effusion Mediastinum/Nodes: Tracheal deviation to the right. Multiple borderline mediastinal lymph nodes. High prevascular lymph nodes measuring up to 10 mm. Left prevascular node measuring 9 mm. Left low paratracheal node measures 12 mm, previously 8 mm. Esophagus within normal limits. Lungs/Pleura: Redemonstrated sharply marginated area of consolidation in the right upper lobe surrounding fiducial markers and associated with volume loss presumably due to post radiation change. Wedge resection changes in the medial left upper  lobe. Interim development of heterogeneous airspace disease in the right middle and left greater than right lower lobes, suspicious for multifocal pneumonia. Trace left-sided pleural effusion. Upper Abdomen: No acute finding. Incompletely visualized complex splenic lesion stable small right adrenal adenoma. Musculoskeletal: No acute or suspicious osseous abnormality. IMPRESSION: 1. Interim development of heterogeneous airspace disease in the right middle and left greater than right lower lobes, suspicious for multifocal pneumonia. Trace left-sided pleural effusion. 2. Slight increase in mediastinal nodes, possibly reactive but attention on follow-up imaging. 3. Stable post radiation changes in the right upper lobe. Wedge resection changes in the medial left upper lobe. 4. Dilated pulmonary trunk and enlarged central pulmonary vessels consistent with arterial hypertension. Aortic Atherosclerosis (ICD10-I70.0). Electronically Signed   By: Luke Bun M.D.   On: 03/30/2024 23:34    Microbiology: Results for orders placed or performed during the hospital encounter of 04/17/24  Resp panel by RT-PCR (RSV, Flu A&B, Covid) Anterior Nasal Swab     Status: None   Collection Time: 04/17/24  1:25 PM   Specimen: Anterior Nasal Swab  Result Value Ref Range Status   SARS Coronavirus 2 by RT PCR NEGATIVE NEGATIVE Final    Comment: (NOTE) SARS-CoV-2 target nucleic acids are NOT DETECTED.  The SARS-CoV-2 RNA is generally detectable in upper respiratory specimens during the acute phase of infection. The lowest concentration of SARS-CoV-2 viral copies this assay can detect is 138 copies/mL. A negative result does not preclude SARS-Cov-2 infection and should not be used as the sole basis for treatment or other patient management decisions. A negative result may occur with  improper specimen collection/handling, submission of specimen other than nasopharyngeal swab, presence of viral mutation(s) within the areas  targeted by this assay, and inadequate number of viral copies(<138 copies/mL). A negative result must be combined with clinical observations, patient history, and epidemiological information. The expected result is Negative.  Fact Sheet for Patients:  BloggerCourse.com  Fact Sheet for Healthcare Providers:  SeriousBroker.it  This test is no t yet approved or cleared by the [place]  FDA and  has been authorized for detection and/or diagnosis of SARS-CoV-2 by FDA under an Emergency Use Authorization (EUA). This EUA will remain  in effect (meaning this test can be used) for the duration of the COVID-19 declaration under Section 564(b)(1) of the Act, 21 U.S.C.section 360bbb-3(b)(1), unless the authorization is terminated  or revoked sooner.       Influenza A by PCR NEGATIVE NEGATIVE Final   Influenza B by PCR NEGATIVE NEGATIVE Final    Comment: (NOTE) The Xpert Xpress SARS-CoV-2/FLU/RSV plus assay is intended as an aid in the diagnosis of influenza from Nasopharyngeal swab specimens and should not be used as a sole basis  for treatment. Nasal washings and aspirates are unacceptable for Xpert Xpress SARS-CoV-2/FLU/RSV testing.  Fact Sheet for Patients: BloggerCourse.com  Fact Sheet for Healthcare Providers: SeriousBroker.it  This test is not yet approved or cleared by the [place]  FDA and has been authorized for detection and/or diagnosis of SARS-CoV-2 by FDA under an Emergency Use Authorization (EUA). This EUA will remain in effect (meaning this test can be used) for the duration of the COVID-19 declaration under Section 564(b)(1) of the Act, 21 U.S.C. section 360bbb-3(b)(1), unless the authorization is terminated or revoked.     Resp Syncytial Virus by PCR NEGATIVE NEGATIVE Final    Comment: (NOTE) Fact Sheet for  Patients: BloggerCourse.com  Fact Sheet for Healthcare Providers: SeriousBroker.it  This test is not yet approved or cleared by the [place]  FDA and has been authorized for detection and/or diagnosis of SARS-CoV-2 by FDA under an Emergency Use Authorization (EUA). This EUA will remain in effect (meaning this test can be used) for the duration of the COVID-19 declaration under Section 564(b)(1) of the Act, 21 U.S.C. section 360bbb-3(b)(1), unless the authorization is terminated or revoked.  Performed at [facility], [address], [place], [place] [address]   Culture, blood (routine x 2)     Status: None (Preliminary result)   Collection Time: 04/17/24  2:31 PM   Specimen: BLOOD  Result Value Ref Range Status   Specimen Description   Final    BLOOD SITE NOT SPECIFIED Performed at [facility], [address], [place], [place] [address]    Special Requests   Final    BOTTLES DRAWN AEROBIC AND ANAEROBIC Blood Culture adequate volume Performed at [facility], [address], [place], [place] [address]    Culture   Final    NO GROWTH 4 DAYS Performed at [facility], [address] [place], [place] [address]    Report Status PENDING  Incomplete  Culture, blood (routine x 2)     Status: None (Preliminary result)   Collection Time: 04/17/24  3:08 PM   Specimen: BLOOD RIGHT ARM  Result Value Ref Range Status   Specimen Description   Final    BLOOD RIGHT ARM Performed at [facility], [address], [place], [place] [address]    Special Requests   Final    BOTTLES DRAWN AEROBIC AND ANAEROBIC Blood Culture adequate volume Performed at [facility], [address], [place], [place] [address]    Culture   Final    NO GROWTH 4 DAYS Performed at [facility], [address] [place], [place] [address]    Report Status PENDING   Incomplete  Respiratory (~20 pathogens) panel by PCR     Status: None   Collection Time: 04/18/24 10:20 AM   Specimen: Nasopharyngeal Swab; Respiratory  Result Value Ref Range Status   Adenovirus NOT DETECTED NOT DETECTED Final   Coronavirus 229E NOT DETECTED NOT DETECTED Final    Comment: (NOTE) The Coronavirus on the Respiratory Panel, DOES NOT test for the novel  Coronavirus (2019 nCoV)    Coronavirus HKU1 NOT DETECTED NOT DETECTED Final   Coronavirus NL63 NOT DETECTED NOT DETECTED Final   Coronavirus OC43 NOT DETECTED NOT DETECTED Final   Metapneumovirus NOT DETECTED NOT DETECTED Final   Rhinovirus / Enterovirus NOT DETECTED NOT DETECTED Final   Influenza A NOT DETECTED NOT DETECTED Final   Influenza B NOT DETECTED NOT DETECTED Final   Parainfluenza  Virus 1 NOT DETECTED NOT DETECTED Final   Parainfluenza Virus 2 NOT DETECTED NOT DETECTED Final   Parainfluenza Virus 3 NOT DETECTED NOT DETECTED Final   Parainfluenza Virus 4 NOT DETECTED NOT DETECTED Final   Respiratory Syncytial Virus NOT DETECTED NOT DETECTED Final   Bordetella pertussis NOT DETECTED NOT DETECTED Final   Bordetella Parapertussis NOT DETECTED NOT DETECTED Final   Chlamydophila pneumoniae NOT DETECTED NOT DETECTED Final   Mycoplasma pneumoniae NOT DETECTED NOT DETECTED Final    Comment: Performed at [name], [address] [place], [place] [address]  MRSA Next Gen by PCR, Nasal     Status: None   Collection Time: 04/18/24 11:57 AM   Specimen: Nasal Mucosa; Nasal Swab  Result Value Ref Range Status   MRSA by PCR Next Gen NOT DETECTED NOT DETECTED Final    Comment: (NOTE) The GeneXpert MRSA Assay (FDA approved for NASAL specimens only), is one component of a comprehensive MRSA colonization surveillance program. It is not intended to diagnose MRSA infection nor to guide or monitor treatment for MRSA infections. Test performance is not FDA approved in patients less than 80 years old. Performed at  [facility], [address] [place], [place] [address]   Expectorated Sputum Assessment w Gram Stain, Rflx to Resp Cult     Status: None   Collection Time: 04/19/24  4:32 PM   Specimen: Expectorated Sputum  Result Value Ref Range Status   Specimen Description EXPECTORATED SPUTUM  Final   Special Requests NONE  Final   Sputum evaluation   Final    THIS SPECIMEN IS ACCEPTABLE FOR SPUTUM CULTURE Performed at [facility], [address], [place], [place] [address]    Report Status 04/19/2024 FINAL  Final  Culture, Respiratory w Gram Stain     Status: None (Preliminary result)   Collection Time: 04/19/24  4:32 PM  Result Value Ref Range Status   Specimen Description   Final    EXPECTORATED SPUTUM Performed at [facility], [address] [place], [place] [address]    Special Requests   Final    NONE Reflexed from [id number] Performed at [facility], [address] [place], [place] [address]    Gram Stain   Final    FEW WBC PRESENT, PREDOMINANTLY PMN NO ORGANISMS SEEN Performed at [facility], [address], [place], [place] [address]    Culture PENDING  Incomplete   Report Status PENDING  Incomplete    Labs: CBC: Recent Labs  Lab 04/17/24 1409 04/18/24 0401 04/19/24 0350  WBC 12.2* 11.2* 11.1*  HGB 10.7* 10.3* 10.0*  HCT 34.6* 32.9* 32.4*  MCV 89.6 91.1 89.0  PLT 214 197 201   Basic Metabolic Panel: Recent Labs  Lab 04/17/24 1409 04/18/24 0401 04/19/24 0350  NA 135 135 133*  K 4.3 4.2 4.5  CL 99 99 100  CO2 29 26 25   GLUCOSE 99 131* 168*  BUN 31* 31* 43*  CREATININE 1.05 1.22 1.10  CALCIUM  9.0 8.7* 8.8*  MG  --   --  2.1  PHOS  --   --  2.7   Liver Function Tests: Recent Labs  Lab 04/18/24 0401 04/19/24 0350  AST 19 17  ALT 15 15  ALKPHOS 89 86  BILITOT 1.1 0.8  PROT 6.3* 6.5  ALBUMIN 3.2* 3.1*   CBG: No results for input(s): GLUCAP in the last 168  hours.  Discharge time spent:  greater than 30 minutes.  Signed: Sabas GORMAN Brod, MD [facility] 04/21/2024

## 2024-04-21 NOTE — Plan of Care (Signed)
   Problem: Clinical Measurements: Goal: Will remain free from infection Outcome: Progressing   Problem: Clinical Measurements: Goal: Diagnostic test results will improve Outcome: Progressing   Problem: Clinical Measurements: Goal: Respiratory complications will improve Outcome: Progressing   Problem: Clinical Measurements: Goal: Cardiovascular complication will be avoided Outcome: Progressing

## 2024-04-22 LAB — CULTURE, BLOOD (ROUTINE X 2)
Culture: NO GROWTH
Culture: NO GROWTH
Special Requests: ADEQUATE
Special Requests: ADEQUATE

## 2024-04-22 LAB — CULTURE, RESPIRATORY W GRAM STAIN: Culture: NORMAL

## 2024-04-24 ENCOUNTER — Telehealth: Payer: Self-pay

## 2024-04-24 NOTE — Transitions of Care (Post Inpatient/ED Visit) (Signed)
   04/24/2024  Name: [name] MRN: [id number] DOB: 06/20/1951  Today's TOC FU Call Status: Today's TOC FU Call Status:: Unsuccessful Call (1st Attempt) Unsuccessful Call (1st Attempt) Date: 04/24/24  Attempted to reach the patient regarding the most recent Inpatient/ED visit.  Follow Up Plan: Additional outreach attempts will be made to reach the patient to complete the Transitions of Care (Post Inpatient/ED visit) call.    Bing Edison MSN, RN RN Case Sales executive Health  [facility] Office Hours M-F [phone] Direct Dial: [phone] Main Phone [phone]  Fax: [phone] [email or web address]

## 2024-04-26 ENCOUNTER — Telehealth: Payer: Self-pay

## 2024-04-26 NOTE — Transitions of Care (Post Inpatient/ED Visit) (Signed)
   04/26/2024  Name: [name] MRN: [id number] DOB: [birth year]  Today's TOC FU Call Status: Today's TOC FU Call Status:: Unsuccessful Call (2nd Attempt) Unsuccessful Call (2nd Attempt) Date: 04/26/24  Attempted to reach the patient regarding the most recent Inpatient/ED visit. Patient answered, TOC RN identified self and reason for outreach.  Patient requested a call back as he was driving.   Follow Up Plan: Additional outreach attempts will be made to reach the patient to complete the Transitions of Care (Post Inpatient/ED visit) call.    Bing Edison MSN, RN RN Case Sales executive Health  [facility] Office Hours M-F [phone] Direct Dial: [phone] Main Phone [phone]  Fax: [phone] [email or web address]

## 2024-04-28 ENCOUNTER — Telehealth: Payer: Self-pay | Admitting: Cardiology

## 2024-04-28 ENCOUNTER — Other Ambulatory Visit: Payer: Self-pay | Admitting: Cardiology

## 2024-04-28 ENCOUNTER — Telehealth: Payer: Self-pay

## 2024-04-28 ENCOUNTER — Other Ambulatory Visit (HOSPITAL_COMMUNITY): Payer: Self-pay

## 2024-04-28 MED ORDER — METOPROLOL SUCCINATE ER 25 MG PO TB24: 25.0000 mg | ORAL_TABLET | Freq: Every day | ORAL | 0 refills | Status: DC

## 2024-04-28 NOTE — Transitions of Care (Post Inpatient/ED Visit) (Addendum)
   04/28/2024  Name: [name] MRN: [id number] DOB: [birth year]  Today's TOC FU Call Status: Today's TOC FU Call Status:: Unsuccessful Call (3rd Attempt) Unsuccessful Call (3rd Attempt) Date: 04/28/24  Attempted to reach the patient regarding the most recent Inpatient/ED visit. ON chart review it was noted that PCP HFU scheduled for 05/02/24.   Follow Up Plan: No further outreach attempts will be made at this time. We have been unable to contact the patient.   Bing Edison MSN, RN RN Case Sales executive Health  [facility] Office Hours M-F [phone] Direct Dial: [phone] Main Phone [phone]  Fax: [phone] [email or web address]

## 2024-04-28 NOTE — Telephone Encounter (Signed)
*  STAT* If patient is at the pharmacy, call can be transferred to refill team.   1. Which medications need to be refilled? (please list name of each medication and dose if known)   metoprolol  succinate (TOPROL  XL) 25 MG 24 hr tablet     2. Would you like to learn more about the convenience, safety, & potential cost savings by using the [facility]? No    3. Are you open to using the [facility] (Type [facility]. ) No   4. Which pharmacy/location (including street and city if local pharmacy) is medication to be sent to? [facility] [address], [place] - [address] Phone: [phone]  Fax: [phone]     5. Do they need a 30 day or 90 day supply? 90 day

## 2024-04-28 NOTE — Telephone Encounter (Signed)
 Pt due for an appt in November, 90 day refill sent to [facility].

## 2024-05-02 ENCOUNTER — Encounter (HOSPITAL_BASED_OUTPATIENT_CLINIC_OR_DEPARTMENT_OTHER): Payer: Self-pay | Admitting: Student

## 2024-05-02 ENCOUNTER — Ambulatory Visit (INDEPENDENT_AMBULATORY_CARE_PROVIDER_SITE_OTHER): Admitting: Student

## 2024-05-02 VITALS — BP 150/68 | HR 80 | Temp 98.8°F | Resp 18 | Ht 72.0 in | Wt 211.3 lb

## 2024-05-02 DIAGNOSIS — J439 Emphysema, unspecified: Secondary | ICD-10-CM

## 2024-05-02 DIAGNOSIS — Z09 Encounter for follow-up examination after completed treatment for conditions other than malignant neoplasm: Secondary | ICD-10-CM | POA: Diagnosis not present

## 2024-05-02 DIAGNOSIS — I10 Essential (primary) hypertension: Secondary | ICD-10-CM

## 2024-05-02 DIAGNOSIS — J189 Pneumonia, unspecified organism: Secondary | ICD-10-CM

## 2024-05-02 DIAGNOSIS — F5101 Primary insomnia: Secondary | ICD-10-CM

## 2024-05-02 MED ORDER — ZOLPIDEM TARTRATE 10 MG PO TABS: 10.0000 mg | ORAL_TABLET | Freq: Every evening | ORAL | 0 refills | Status: AC | PRN

## 2024-05-02 NOTE — Progress Notes (Signed)
 Established Patient Office Visit  Subjective   Patient ID: [name], male    DOB: December 25, 1950  Age: 73 y.o. MRN: [id number]  Chief Complaint  Patient presents with   Hospitalization Follow-up    Hospital follow up. Hydrochlorothiazide  was drying him out too much. Did not have enough to spit. It was awful. Not sure why he was put on two fluid pills.     HPI  Discussed the use of AI scribe software for clinical note transcription with the patient, who gave verbal consent to proceed.  History of Present Illness   [name] is a 73 year old male with hx of COPD, hypertension, and hyperkalemia who presents for follow-up after a recent hospital stay.  He was recently hospitalized due to multi focal pneumonia. Prior to this, he had developed elevated potassium levels, which were attributed to the use of an ACE inhibitor. The ACE inhibitor was stopped and potassium was rechecked which had further spiked to 6.0mmol/L despite therapy on lasix . He also had concurrent elevated blood pressure due to discontinuation of the ACE inhibitor. HCTZ was added for treatment of hyperkalemia and blood pressure control which eventually reduced his K within normal levels. However, this led to significant dehydration, with urination 'every 25 minutes' and severe dryness. Due to concurrent COPD and healing pneumonia, his oxygen  level lowered as he developed dehydration and he reported to the hospital for evaluation and treatment. They stopped the HCTZ and treated for his continued pneumonia.  He has ongoing issues with pneumonia, for which he was treated with IV antibiotics during the hospital stay as above. No diarrhea after antibiotic use. He is not currently on antibiotics but is scheduled for a follow-up CT scan in six weeks to ensure resolution. He is still having difficulty in expectorating sputum despite using Mucinex , nebulizer, and drinking water. His oxygen  sat today is stable at 94% on 3L O2.    He  has a humidifier at home but is unsure if it is in use. His nebulizer machine is several years old and may not be functioning optimally.  His blood pressure at home has been around 135/65 mmHg, though it was noted to be higher at an eye doctor's visit at 156/82 mmHg. He is currently not on hydrochlorothiazide  due to previous adverse effects as above. Prefer to stay away from ACE/ARB due to hyperkalemia.  He uses Ambien  occasionally for sleep, with a few pills remaining- he requires a refill. He denies any issues with waking during the night related to complex sleep behavior.      Patient Active Problem List   Diagnosis Date Noted   COPD exacerbation (HCC) 04/17/2024   Senile purpura (HCC) 04/05/2024   Hyperkalemia 03/31/2024   Multifocal pneumonia 03/31/2024   Sepsis due to pneumonia (HCC) 03/30/2024   Statin myopathy 02/21/2024   Chronic constipation 02/21/2024   Primary osteoarthritis of left distal radioulnar joint 02/21/2024   Primary osteoarthritis of right distal radioulnar joint 01/28/2024   Idiopathic chronic gout of multiple sites without tophus 01/24/2024   Cubital tunnel syndrome on right 07/09/2023   COPD with acute exacerbation (HCC) 05/26/2023   Chronic diastolic CHF (congestive heart failure) (HCC) 05/07/2023   Acute on chronic diastolic CHF (congestive heart failure) (HCC) 05/06/2023   Acute on chronic respiratory failure with hypoxia (HCC) 05/06/2023   DOE (dyspnea on exertion) 04/07/2023   Congestive heart failure (HCC) 01/26/2023   GERD (gastroesophageal reflux disease) 01/26/2023   Obesity 01/26/2023   Polycythemia 01/26/2023  Pulmonary hypertension (HCC) 01/26/2023   Aortic atherosclerosis (HCC) 01/26/2023   Coronary artery calcification 01/26/2023   Carpal tunnel syndrome, right upper limb 11/03/2022   Lesion of ulnar nerve, right upper limb 11/03/2022   Numbness of right hand 06/28/2022   Status post bronchoscopy with biopsy    Port catheter in place  08/20/2016   NHL (non-Hodgkin's lymphoma) (HCC) 09/25/2015   Lymphadenopathy syndrome 09/12/2015   Mediastinal adenopathy 09/12/2015   Chronic respiratory failure with hypoxia (HCC) 08/27/2015   Hypothyroidism 03/30/2012   Depression 12/03/2011   Pure hypercholesterolemia 12/03/2011   Atrial fibrillation (HCC) 10/26/2011   Other dysphagia 11/11/2010   Squamous cell carcinoma lung (HCC) 09/26/2010   BPH (benign prostatic hyperplasia) 2011   ERECTILE DYSFUNCTION 09/24/2008   Essential hypertension 06/05/2008   COPD (chronic obstructive pulmonary disease) (HCC) 06/05/2008   Past Medical History:  Diagnosis Date   Abnormal finding on GI tract imaging 09/07/2023   Acute diastolic CHF (congestive heart failure) (HCC) 05/07/2023   Acute on chronic diastolic CHF (congestive heart failure) (HCC) 05/06/2023   Acute on chronic respiratory failure with hypoxia (HCC) 05/06/2023   Aortic atherosclerosis (HCC) 01/26/2023   Arthritis    Atrial fibrillation (HCC) 10/26/2011   Benign neoplasm of colon 09/07/2023   BPH (benign prostatic hyperplasia) 2011   BRONCHOPNEUMONIA ORGANISM UNSPECIFIED 06/12/2010         Replacing diagnoses that were inactivated after the 12/21/22 regulatory import   Carpal tunnel syndrome, right upper limb 11/03/2022   Chronic respiratory failure with hypoxia (HCC) 08/27/2015   Congestive heart failure (HCC)    COPD (chronic obstructive pulmonary disease) (HCC)    COPD with acute exacerbation (HCC) 05/26/2023   Coronary artery calcification 01/26/2023   Cubital tunnel syndrome on right 07/09/2023   Depression    DOE (dyspnea on exertion) 04/07/2023   Essential hypertension 06/05/2008   Qualifier: Diagnosis of   By: [name]), [name]       GERD (gastroesophageal reflux disease)    Hypothyroidism 03/30/2012   Lesion of ulnar nerve, right upper limb 11/03/2022   Lymphadenopathy syndrome 09/12/2015   Mediastinal adenopathy 09/12/2015   NHL (non-Hodgkin's lymphoma)  (HCC) 09/25/2015   Numbness of right hand 06/28/2022   Obesity    Open wound of right hand without foreign body 07/27/2021   Other dysphagia 11/11/2010   Qualifier: Diagnosis of   By: [name], [name]       Polycythemia 01/26/2023   Port catheter in place 08/20/2016   Pulmonary hypertension (HCC)    Pure hypercholesterolemia 12/03/2011   Recurrent pneumonia 02/10/2012   Routine general medical examination at a health care facility 12/03/2011   Squamous cell carcinoma lung (HCC) 09/26/2010   S/p LUL resection Dr Brantley dec 2011      Status post bronchoscopy with biopsy    Stiffness of finger joint of right hand 09/24/2021   Testicular swelling 09/12/2015   Social History   Tobacco Use   Smoking status: Former    Current packs/day: 0.00    Average packs/day: 3.0 packs/day for 43.0 years (129.0 ttl pk-yrs)    Types: Cigarettes    Start date: 10/22/1958    Quit date: 10/22/2001    Years since quitting: 22.5    Passive exposure: Current   Smokeless tobacco: Never  Vaping Use   Vaping status: Never Used  Substance Use Topics   Alcohol  use: No   Drug use: No   Allergies  Allergen Reactions   Hydrochlorothiazide  Other (See Comments)  Dried out the patient too much   Lipitor [Atorvastatin ] Other (See Comments)    Myalgias  Weakness  Fatigue    Omnipaque  [Iohexol ] Itching and Other (See Comments)    When asked about contrast media today, pt stated that last time he had the contrast that he was red all over and itchy from head to toe.    Tape Other (See Comments)    Band-Aids irritate the skin      ROS Per HPI.    Objective:     BP (!) 150/68   Pulse 80   Temp 98.8 F (37.1 C) (Oral)   Resp 18   Ht 6' (1.829 m)   Wt 211 lb 4.8 oz (95.8 kg)   SpO2 95%   PF (!) 3 L/min   BMI 28.66 kg/m  BP Readings from Last 3 Encounters:  05/02/24 (!) 150/68  04/21/24 137/71  04/05/24 134/64   Wt Readings from Last 3 Encounters:  05/02/24 211 lb 4.8 oz (95.8 kg)   04/17/24 202 lb (91.6 kg)  04/05/24 208 lb 14.4 oz (94.8 kg)      Physical Exam Constitutional:      General: He is not in acute distress.    Appearance: Normal appearance. He is not ill-appearing.     Comments: Patient on O2 with nasal cannula  HENT:     Head: Normocephalic and atraumatic.     Right Ear: External ear normal.     Left Ear: External ear normal.     Nose: Nose normal.  Eyes:     Conjunctiva/sclera: Conjunctivae normal.  Cardiovascular:     Rate and Rhythm: Normal rate and regular rhythm.     Pulses: Normal pulses.     Heart sounds: Normal heart sounds. No murmur heard.    No friction rub.  Pulmonary:     Effort: Pulmonary effort is normal. No respiratory distress.     Breath sounds: Normal breath sounds. No wheezing, rhonchi or rales.  Skin:    General: Skin is warm and dry.     Coloration: Skin is not jaundiced or pale.  Neurological:     Mental Status: He is alert.  Psychiatric:        Mood and Affect: Mood normal.        Behavior: Behavior normal.      No results found for any visits on 05/02/24.  Last CBC Lab Results  Component Value Date   WBC 11.1 (H) 04/19/2024   HGB 10.0 (L) 04/19/2024   HCT 32.4 (L) 04/19/2024   MCV 89.0 04/19/2024   MCH 27.5 04/19/2024   RDW 14.2 04/19/2024   PLT 201 04/19/2024   Last metabolic panel Lab Results  Component Value Date   GLUCOSE 168 (H) 04/19/2024   NA 133 (L) 04/19/2024   K 4.5 04/19/2024   CL 100 04/19/2024   CO2 25 04/19/2024   BUN 43 (H) 04/19/2024   CREATININE 1.10 04/19/2024   GFRNONAA >60 04/19/2024   CALCIUM  8.8 (L) 04/19/2024   PHOS 2.7 04/19/2024   PROT 6.5 04/19/2024   ALBUMIN 3.1 (L) 04/19/2024   LABGLOB 2.1 04/07/2023   BILITOT 0.8 04/19/2024   ALKPHOS 86 04/19/2024   AST 17 04/19/2024   ALT 15 04/19/2024   ANIONGAP 8 04/19/2024   Last lipids Lab Results  Component Value Date   CHOL 156 03/01/2024   HDL 43 03/01/2024   LDLCALC 103 (H) 03/01/2024   LDLDIRECT 99  05/18/2023   TRIG 48 03/01/2024  CHOLHDL 3.6 03/01/2024   Last hemoglobin A1c Lab Results  Component Value Date   HGBA1C 5.6 03/01/2024      The 10-year ASCVD risk score (Arnett DK, et al., 2019) is: 30.9%    Assessment & Plan:   Assessment and Plan    Hospital discharge/Pneumonia, resolving Recent hospitalization for pneumonia. Currently not on antibiotics. Breathing has not fully improved, and he is on supplemental oxygen . A follow-up CT scan is planned in six weeks to ensure resolution of pneumonia. - Plan for follow-up CT scan in six weeks - Follow up with pulmonologist on September 9th  Chronic obstructive pulmonary disease (COPD) Chronic with continued trouble breathing on O2. COPD with ongoing management. Current nebulizer machine is 27-51 years old and may not be functioning optimally. He is not using a humidifier, which could help moisten airways. - Rx for nebulizer machine to obtain a new one from a medical supply store - Ensure adequate supply of nebulizer solution - Continue to follow with pulmonology  Hypertension Chronic, not at goal today but states BP at goal at home. Blood pressure management is ongoing. Recent readings have been variable, with some elevated readings noted but mostly , 140/90. Current goal is to maintain blood pressure below 140/90 mmHg due to recent history. I believe that it is best to avoid excess diuresis in the future outside of an inpatient setting. - Continue current antihypertensive regimen - Monitor blood pressure regularly at home- BP log  Hyperkalemia, resolved Previous hyperkalemia due to ACE inhibitor use, resolved after discontinuation of ACE and use of diuretics. Potassium levels have normalized. Risks of hyperkalemia include fatal arrhythmias. - Avoid reintroduction of ACE inhibitors - Continue lasix . - Avoid HCTZ- did not tolerate  Dehydration, resolved Dehydration occurred secondary to diuretic use, particularly  hydrochlorothiazide , which has been discontinued. Developed dehydration and excessive diuresis with hydrochlorothiazide  use. Added to allergy list to prevent future use. - Avoid use of hydrochlorothiazide  in the future - Encourage adequate fluid intake   Insomnia Chronic, stable. - Refill ambien  10mg      Return in about 3 months (around 08/02/2024) for Chronic Followup.    Jose Malachi T Aariz Maish, PA-C

## 2024-05-02 NOTE — Patient Instructions (Addendum)
 It was nice to see you today!  Please make sure you are drinking plenty of water at this time to help you get over this sickness.  Please do not hesitate to come back before your next visit if you need us !  Thank you! Deashia Soule, PA-C

## 2024-05-03 ENCOUNTER — Telehealth: Payer: Self-pay

## 2024-05-03 ENCOUNTER — Encounter: Payer: Self-pay | Admitting: Internal Medicine

## 2024-05-03 NOTE — Telephone Encounter (Signed)
 Spoke with patient regarding prior message. Advised patient per Lauraine  He needs to be seen with CXR. He may need to add Mucinex  and flutter valve to his treatment which can be done when he is seen. He needs to wear his oxygen  at all times to maintain his oxygen  saturation at greater than 88%. He needs humidified oxygen  if it is not already humidified. If he gets worse he needs to go to urgent care or seek emergency care.   Patient stated he has been taking Mucinex  and using his flutter valve as well. Patient does have a HFU with DR.Ramaswamy on 05/30/24. Advised patient I will place a order for patient to get a humidified  on his concentrator. Advised patient I will check again in the morning to look at the schedule. Patient's voice was understanding.Nothing else further needed

## 2024-05-03 NOTE — Telephone Encounter (Signed)
 Spoke with patient regarding [employer] . Patient stated he can not get the mucus up when patient is coughing and this has started once patient cam home from the Hospital on 04/21/2024. Patient stated he is on o2l and o2 has been staying between 93-94% when patient takes his oxygen  off its been going to 88% . Advised patient I'm going to send this message over to the Provider of the day and once our office get's the message back someone from the office will contact the patient back .

## 2024-05-09 ENCOUNTER — Inpatient Hospital Stay: Attending: Internal Medicine

## 2024-05-09 DIAGNOSIS — Z95828 Presence of other vascular implants and grafts: Secondary | ICD-10-CM

## 2024-05-09 MED ORDER — SODIUM CHLORIDE 0.9% FLUSH
10.0000 mL | INTRAVENOUS | Status: DC | PRN
  Administered 2024-05-09: 10 mL via INTRAVENOUS

## 2024-05-10 ENCOUNTER — Ambulatory Visit: Payer: Self-pay

## 2024-05-10 ENCOUNTER — Encounter (HOSPITAL_BASED_OUTPATIENT_CLINIC_OR_DEPARTMENT_OTHER): Payer: Self-pay | Admitting: Student

## 2024-05-10 ENCOUNTER — Other Ambulatory Visit (HOSPITAL_BASED_OUTPATIENT_CLINIC_OR_DEPARTMENT_OTHER): Payer: Self-pay

## 2024-05-10 ENCOUNTER — Ambulatory Visit (INDEPENDENT_AMBULATORY_CARE_PROVIDER_SITE_OTHER): Admitting: Student

## 2024-05-10 ENCOUNTER — Ambulatory Visit: Admitting: Orthopedic Surgery

## 2024-05-10 VITALS — BP 140/70 | HR 78 | Temp 99.0°F | Resp 16 | Ht 72.0 in | Wt 219.2 lb

## 2024-05-10 DIAGNOSIS — J441 Chronic obstructive pulmonary disease with (acute) exacerbation: Secondary | ICD-10-CM | POA: Diagnosis not present

## 2024-05-10 MED ORDER — PREDNISONE 20 MG PO TABS
40.0000 mg | ORAL_TABLET | Freq: Every day | ORAL | 0 refills | Status: AC
  Filled 2024-05-10: qty 10, 5d supply, fill #0

## 2024-05-10 MED ORDER — AZITHROMYCIN 250 MG PO TABS
ORAL_TABLET | ORAL | 0 refills | Status: AC
  Filled 2024-05-10: qty 6, 5d supply, fill #0

## 2024-05-10 NOTE — Telephone Encounter (Signed)
 FYI Only or Action Required?: Action required by provider: medication refill request.  Patient was last seen in primary care on .  Called Nurse Triage reporting Cough.  Symptoms began several days ago.  Interventions attempted: Prescription medications:  SABRA  Symptoms are: gradually improving. States he found 2 days of antibiotics and I took them and it helped break up my mucus. I feel better. Asking for antibiotics to be sent to pharmacy.  Triage Disposition: See Physician Within 24 Hours  Patient/caregiver understands and will follow disposition?: Yes    Copied from CRM #[id number]. Topic: Clinical - Red Word Triage >> May 10, 2024  8:30 AM Antwanette L wrote: Red Word that prompted transfer to Nurse Triage: Sob.Patient was seen at [facility] on 8/1 due to pneumonia. Patient needs to discuss sob. Pt is currently has no symptoms. Reason for Disposition  Coughing up rusty-colored (reddish-brown) sputum  Answer Assessment - Initial Assessment Questions 1. ONSET: When did the cough begin?      2 days 2. SEVERITY: How bad is the cough today?      mild 3. SPUTUM: Describe the color of your sputum (e.g., none, dry cough; clear, white, yellow, green)     brownish 4. HEMOPTYSIS: Are you coughing up any blood? If Yes, ask: How much? (e.g., flecks, streaks, tablespoons, etc.)     no 5. DIFFICULTY BREATHING: Are you having difficulty breathing? If Yes, ask: How bad is it? (e.g., mild, moderate, severe)      no 6. FEVER: Do you have a fever? If Yes, ask: What is your temperature, how was it measured, and when did it start?     no 7. CARDIAC HISTORY: Do you have any history of heart disease? (e.g., heart attack, congestive heart failure)      yes 8. LUNG HISTORY: Do you have any history of lung disease?  (e.g., pulmonary embolus, asthma, emphysema)     yes 9. PE RISK FACTORS: Do you have a history of blood clots? (or: recent major surgery, recent  prolonged travel, bedridden)     no 10. OTHER SYMPTOMS: Do you have any other symptoms? (e.g., runny nose, wheezing, chest pain)       no 11. PREGNANCY: Is there any chance you are pregnant? When was your last menstrual period?       N/a 12. TRAVEL: Have you traveled out of the country in the last month? (e.g., travel history, exposures)       no  Protocols used: Cough - Acute Productive-A-AH

## 2024-05-10 NOTE — Progress Notes (Signed)
 Acute Office Visit  Subjective:     Patient ID: [name], male    DOB: 1950/10/09, 73 y.o.   MRN: [id number]  Chief Complaint  Patient presents with   Nasal Congestion     Coughing up rusty-colored (reddish-brown) sputum. Took some Augmentin  875-125mg   for 2 days and it helped cough mucus up since finished last antibiotic he finished at hospital. Rx is expired from 01/2024. No fever or other symptoms. When he cannot cough it up, gets SOB and O2 will drop. BP at home ranging 120's-130's/50's-60's.     HPI  Discussed the use of AI scribe software for clinical note transcription with the patient, who gave verbal consent to proceed.  History of Present Illness   [name] is a 73 year old male with COPD who presents with worsening respiratory symptoms.  Three days after his last visit, he experienced worsening respiratory symptoms without productive cough. He was given vitamin A, and about a day and a half later, he started coughing up phlegm, which provided some relief. He had completed a course of Augmentin  two weeks ago, which initially helped, but he had not coughed anything up since then until he started taking the vitamin A and xenobiotic.  He experiences shortness of breath, especially with activity, but finds relief after coughing up phlegm and using his breathing treatments. He uses a nebulizer five to six times a day and takes Mucinex  regularly. He was previously given steroids, which he has completed, and he reports that they helped.  He mentions that doxycycline  makes him nauseated and prefers azithromycin , which he has taken before without issues. He monitors his blood pressure at home, with readings typically between 120/80 and 139/70.      ROS Per HPI     Objective:    BP (!) 140/70   Pulse 78   Temp 99 F (37.2 C) (Oral)   Resp 16   Ht 6' (1.829 m)   Wt 219 lb 3.2 oz (99.4 kg)   SpO2 97%   PF (!) 3 L/min   BMI 29.73 kg/m    Physical  Exam Constitutional:      General: He is not in acute distress.    Appearance: Normal appearance. He is not ill-appearing.  HENT:     Head: Normocephalic and atraumatic.     Right Ear: External ear normal.     Left Ear: External ear normal.     Nose: Nose normal.  Eyes:     Conjunctiva/sclera: Conjunctivae normal.  Cardiovascular:     Rate and Rhythm: Normal rate.  Pulmonary:     Effort: Pulmonary effort is normal. No respiratory distress.     Breath sounds: Wheezing (trace) and rhonchi present. No rales.  Skin:    General: Skin is warm and dry.     Coloration: Skin is not jaundiced or pale.  Neurological:     Mental Status: He is alert.  Psychiatric:        Mood and Affect: Mood normal.        Behavior: Behavior normal.     No results found for any visits on 05/10/24.      Assessment & Plan:   Assessment and Plan    COPD exacerbation Acute exacerbation of COPD versus continued trouble with pneumonia with persistent symptoms including dyspnea and productive cough with discolored sputum. Recent treatment with Augmentin  and steroids provided some relief, but symptoms have returned. Pulmonologist advised continuation of nebulizer and inhaler treatments.  No specific bacterial infection identified from recent hospital stay. Negative for Legionella and pna urinary antigen. Rhonchi and wheezing present on lung examination. Decision to change antibiotic due to previous intolerance to doxycycline  and recent use of Augmentin . Azithromycin  chosen as an alternative. - Prescribe azithromycin  for 5 days. - Prescribe additional course of steroids for 5 days. - Continue current nebulizer and inhaler regimen. - Schedule follow-up appointment in 1.5 to 2 weeks.  Hypertension Blood pressure is well-controlled at home with readings typically between 120-139/70-90 mmHg. Highest recent reading was 139/70 mmHg, which is within acceptable range. - Continue current blood pressure monitoring  routine.      Return in about 2 weeks (around 05/24/2024), or if symptoms worsen or fail to improve, for copd.  Exavior Kimmons T Sarabi Sockwell, PA-C

## 2024-05-10 NOTE — Patient Instructions (Signed)
 It was nice to see you today!  If you have any problems before your next visit feel free to message me via MyChart (minor issues or questions) or call the office, otherwise you may reach out to schedule an office visit.  Thank you! Pau Banh, PA-C

## 2024-05-10 NOTE — Telephone Encounter (Signed)
 Patient scheduled thru MyChart

## 2024-05-11 ENCOUNTER — Other Ambulatory Visit (HOSPITAL_BASED_OUTPATIENT_CLINIC_OR_DEPARTMENT_OTHER): Admitting: Radiology

## 2024-05-23 ENCOUNTER — Ambulatory Visit (HOSPITAL_BASED_OUTPATIENT_CLINIC_OR_DEPARTMENT_OTHER): Admitting: Student

## 2024-05-24 ENCOUNTER — Ambulatory Visit (HOSPITAL_COMMUNITY)
Admission: RE | Admit: 2024-05-24 | Discharge: 2024-05-24 | Disposition: A | Discharge status: Home / Self Care | Source: Ambulatory Visit | Attending: Physician Assistant | Admitting: Physician Assistant

## 2024-05-24 ENCOUNTER — Ambulatory Visit (HOSPITAL_BASED_OUTPATIENT_CLINIC_OR_DEPARTMENT_OTHER): Admitting: Student

## 2024-05-24 ENCOUNTER — Encounter (HOSPITAL_BASED_OUTPATIENT_CLINIC_OR_DEPARTMENT_OTHER): Payer: Self-pay | Admitting: Student

## 2024-05-24 ENCOUNTER — Encounter: Payer: Self-pay | Admitting: Internal Medicine

## 2024-05-24 VITALS — BP 132/63 | HR 76 | Temp 98.5°F | Resp 18 | Ht 72.0 in | Wt 208.1 lb

## 2024-05-24 DIAGNOSIS — C3491 Malignant neoplasm of unspecified part of right bronchus or lung: Secondary | ICD-10-CM | POA: Insufficient documentation

## 2024-05-24 DIAGNOSIS — J9611 Chronic respiratory failure with hypoxia: Secondary | ICD-10-CM | POA: Diagnosis not present

## 2024-05-24 DIAGNOSIS — I10 Essential (primary) hypertension: Secondary | ICD-10-CM | POA: Diagnosis not present

## 2024-05-24 DIAGNOSIS — J439 Emphysema, unspecified: Secondary | ICD-10-CM

## 2024-05-24 DIAGNOSIS — J342 Deviated nasal septum: Secondary | ICD-10-CM

## 2024-05-24 MED ORDER — AMLODIPINE BESYLATE 5 MG PO TABS: 5.0000 mg | ORAL_TABLET | Freq: Every day | ORAL | 3 refills | Status: AC

## 2024-05-24 MED ORDER — ALBUTEROL SULFATE (2.5 MG/3ML) 0.083% IN NEBU: 2.5000 mg | INHALATION_SOLUTION | Freq: Four times a day (QID) | RESPIRATORY_TRACT | 3 refills | Status: AC | PRN

## 2024-05-24 NOTE — Progress Notes (Signed)
 Established Patient Office Visit  Subjective   Patient ID: [name], male    DOB: 22-Nov-1950  Age: 73 y.o. MRN: [id number]  Chief Complaint  Patient presents with   Follow-up    Follow-up visit    Medication Refill    Needing refill on Amlodipine  5 mg and Albuterol  Nebulizer Solution, Pt. Requesting a 90 days supply due to his insurance.     HPI  Discussed the use of AI scribe software for clinical note transcription with the patient, who gave verbal consent to proceed.  History of Present Illness   [name] is a 73 year old male with chronic respiratory issues who presents for follow-up after antibiotic treatment.  He has experienced an improvement in breathing since completing a course of antibiotics, with cessation of sputum production. However, he still perceives some residual congestion. A CT scan is scheduled for later today.  He continues to use a nebulizer but requires a refill of the solution. He is also on amlodipine  for blood pressure management, with stable home readings ranging from 118 to 157 systolic. He needs a refill for a 90-day supply of amlodipine .  He has a history of nasal trauma from childhood, resulting in multiple broken noses and an inability to breathe through his left nostril. He describes a sensation of blockage and wonders if this affects his breathing or oxygen  levels. He has been living with this condition since he was a teenager.  He reports currently using two liters of oxygen , reduced from three liters previously. No significant swelling in his lower legs is noted, and he regularly checks them, reporting no issues recently. No significant swelling in his lower legs.      Patient Active Problem List   Diagnosis Date Noted   COPD exacerbation (HCC) 04/17/2024   Senile purpura (HCC) 04/05/2024   Hyperkalemia 03/31/2024   Multifocal pneumonia 03/31/2024   Sepsis due to pneumonia (HCC) 03/30/2024   Statin myopathy 02/21/2024   Chronic  constipation 02/21/2024   Primary osteoarthritis of left distal radioulnar joint 02/21/2024   Primary osteoarthritis of right distal radioulnar joint 01/28/2024   Idiopathic chronic gout of multiple sites without tophus 01/24/2024   Cubital tunnel syndrome on right 07/09/2023   COPD with acute exacerbation (HCC) 05/26/2023   Chronic diastolic CHF (congestive heart failure) (HCC) 05/07/2023   Acute on chronic diastolic CHF (congestive heart failure) (HCC) 05/06/2023   Acute on chronic respiratory failure with hypoxia (HCC) 05/06/2023   DOE (dyspnea on exertion) 04/07/2023   Congestive heart failure (HCC) 01/26/2023   GERD (gastroesophageal reflux disease) 01/26/2023   Obesity 01/26/2023   Polycythemia 01/26/2023   Pulmonary hypertension (HCC) 01/26/2023   Aortic atherosclerosis (HCC) 01/26/2023   Coronary artery calcification 01/26/2023   Carpal tunnel syndrome, right upper limb 11/03/2022   Lesion of ulnar nerve, right upper limb 11/03/2022   Numbness of right hand 06/28/2022   Status post bronchoscopy with biopsy    Port catheter in place 08/20/2016   NHL (non-Hodgkin's lymphoma) (HCC) 09/25/2015   Lymphadenopathy syndrome 09/12/2015   Mediastinal adenopathy 09/12/2015   Chronic respiratory failure with hypoxia (HCC) 08/27/2015   Hypothyroidism 03/30/2012   Depression 12/03/2011   Pure hypercholesterolemia 12/03/2011   Atrial fibrillation (HCC) 10/26/2011   Other dysphagia 11/11/2010   Squamous cell carcinoma lung (HCC) 09/26/2010   BPH (benign prostatic hyperplasia) 2011   ERECTILE DYSFUNCTION 09/24/2008   Essential hypertension 06/05/2008   COPD (chronic obstructive pulmonary disease) (HCC) 06/05/2008   Past Medical  History:  Diagnosis Date   Abnormal finding on GI tract imaging 09/07/2023   Acute diastolic CHF (congestive heart failure) (HCC) 05/07/2023   Acute on chronic diastolic CHF (congestive heart failure) (HCC) 05/06/2023   Acute on chronic respiratory failure  with hypoxia (HCC) 05/06/2023   Aortic atherosclerosis (HCC) 01/26/2023   Arthritis    Atrial fibrillation (HCC) 10/26/2011   Benign neoplasm of colon 09/07/2023   BPH (benign prostatic hyperplasia) 2011   BRONCHOPNEUMONIA ORGANISM UNSPECIFIED 06/12/2010         Replacing diagnoses that were inactivated after the 12/21/22 regulatory import   Carpal tunnel syndrome, right upper limb 11/03/2022   Chronic respiratory failure with hypoxia (HCC) 08/27/2015   Congestive heart failure (HCC)    COPD (chronic obstructive pulmonary disease) (HCC)    COPD with acute exacerbation (HCC) 05/26/2023   Coronary artery calcification 01/26/2023   Cubital tunnel syndrome on right 07/09/2023   Depression    DOE (dyspnea on exertion) 04/07/2023   Essential hypertension 06/05/2008   Qualifier: Diagnosis of   By: [name]), [name]       GERD (gastroesophageal reflux disease)    Hypothyroidism 03/30/2012   Lesion of ulnar nerve, right upper limb 11/03/2022   Lymphadenopathy syndrome 09/12/2015   Mediastinal adenopathy 09/12/2015   NHL (non-Hodgkin's lymphoma) (HCC) 09/25/2015   Numbness of right hand 06/28/2022   Obesity    Open wound of right hand without foreign body 07/27/2021   Other dysphagia 11/11/2010   Qualifier: Diagnosis of   By: [name], [name]       Polycythemia 01/26/2023   Port catheter in place 08/20/2016   Pulmonary hypertension (HCC)    Pure hypercholesterolemia 12/03/2011   Recurrent pneumonia 02/10/2012   Routine general medical examination at a health care facility 12/03/2011   Squamous cell carcinoma lung (HCC) 09/26/2010   S/p LUL resection Dr Brantley dec 2011      Status post bronchoscopy with biopsy    Stiffness of finger joint of right hand 09/24/2021   Testicular swelling 09/12/2015   Social History   Tobacco Use   Smoking status: Former    Current packs/day: 0.00    Average packs/day: 3.0 packs/day for 43.0 years (129.0 ttl pk-yrs)    Types: Cigarettes     Start date: 10/22/1958    Quit date: 10/22/2001    Years since quitting: 22.6    Passive exposure: Current   Smokeless tobacco: Never  Vaping Use   Vaping status: Never Used  Substance Use Topics   Alcohol  use: No   Drug use: No   Allergies  Allergen Reactions   Hydrochlorothiazide  Other (See Comments)    Dried out the patient too much   Lipitor [Atorvastatin ] Other (See Comments)    Myalgias  Weakness  Fatigue    Omnipaque  [Iohexol ] Itching and Other (See Comments)    When asked about contrast media today, pt stated that last time he had the contrast that he was red all over and itchy from head to toe.    Tape Other (See Comments)    Band-Aids irritate the skin      ROS Per HPI.    Objective:     BP 132/63 (BP Location: Right Arm, Patient Position: Sitting, Cuff Size: Normal)   Pulse 76   Temp 98.5 F (36.9 C) (Oral)   Resp 18   Ht 6' (1.829 m)   Wt 208 lb 1.6 oz (94.4 kg)   SpO2 94% Comment: Pt. is wearing his oxygen .  O2 is on 2 liters.  BMI 28.22 kg/m  BP Readings from Last 3 Encounters:  05/24/24 132/63  05/10/24 (!) 140/70  05/02/24 (!) 150/68   Wt Readings from Last 3 Encounters:  05/24/24 208 lb 1.6 oz (94.4 kg)  05/10/24 219 lb 3.2 oz (99.4 kg)  05/02/24 211 lb 4.8 oz (95.8 kg)      Physical Exam Constitutional:      General: He is not in acute distress.    Appearance: Normal appearance. He is not ill-appearing.  HENT:     Head: Normocephalic and atraumatic.     Right Ear: External ear normal.     Left Ear: External ear normal.     Nose:     Comments: Deviated nasal septum to his left side. Eyes:     Conjunctiva/sclera: Conjunctivae normal.  Cardiovascular:     Rate and Rhythm: Normal rate and regular rhythm.     Pulses: Normal pulses.     Heart sounds: Normal heart sounds. No murmur heard.    No friction rub.  Pulmonary:     Effort: Pulmonary effort is normal. No respiratory distress.     Breath sounds: Rhonchi present. No wheezing or  rales.     Comments: About baseline. Decreased air movement over RLL. Skin:    General: Skin is warm and dry.     Coloration: Skin is not jaundiced or pale.  Neurological:     Mental Status: He is alert.  Psychiatric:        Mood and Affect: Mood normal.        Behavior: Behavior normal.      No results found for any visits on 05/24/24.  Last CBC Lab Results  Component Value Date   WBC 11.1 (H) 04/19/2024   HGB 10.0 (L) 04/19/2024   HCT 32.4 (L) 04/19/2024   MCV 89.0 04/19/2024   MCH 27.5 04/19/2024   RDW 14.2 04/19/2024   PLT 201 04/19/2024   Last metabolic panel Lab Results  Component Value Date   GLUCOSE 168 (H) 04/19/2024   NA 133 (L) 04/19/2024   K 4.5 04/19/2024   CL 100 04/19/2024   CO2 25 04/19/2024   BUN 43 (H) 04/19/2024   CREATININE 1.10 04/19/2024   GFRNONAA >60 04/19/2024   CALCIUM  8.8 (L) 04/19/2024   PHOS 2.7 04/19/2024   PROT 6.5 04/19/2024   ALBUMIN 3.1 (L) 04/19/2024   LABGLOB 2.1 04/07/2023   BILITOT 0.8 04/19/2024   ALKPHOS 86 04/19/2024   AST 17 04/19/2024   ALT 15 04/19/2024   ANIONGAP 8 04/19/2024   Last lipids Lab Results  Component Value Date   CHOL 156 03/01/2024   HDL 43 03/01/2024   LDLCALC 103 (H) 03/01/2024   LDLDIRECT 99 05/18/2023   TRIG 48 03/01/2024   CHOLHDL 3.6 03/01/2024   Last hemoglobin A1c Lab Results  Component Value Date   HGBA1C 5.6 03/01/2024      The 10-year ASCVD risk score (Arnett DK, et al., 2019) is: 25.4%    Assessment & Plan:   Assessment and Plan    Chronic respiratory insufficiency/COPD/Multifocal pneumonia followup Chronic respiratory insufficiency on 2 L of O2.  COPD exacerbation versus continued multifocal pneumonia-improvement in symptoms following antibiotic treatment. Persistent sensation of mucus despite cessation of productive cough. No worsening since last visit. - Refill nebulizer solution for 90 days - Patient already has a CT scan today to evaluate lung status placed by his  pulmonologist - Await results of CT scan and  follow up with Dr. Geronimo for further management  Deviated nasal septum-deviated to his left Chronic nasal obstruction on the left side due to a deviated septum, likely resulting from multiple nasal fractures in childhood. Mechanical obstruction with no evidence of polyps. He declined ENT referral for evaluation and possible surgical correction at this time, preferring to discuss with Dr. Geronimo. - Monitor symptoms and consider ENT referral if symptoms worsen or he desires intervention  Hypertension Chronic, stable.  Hypertension well-controlled with current medication regimen. Home blood pressure readings range averaging around 125-130 systolic. - Refill amlodipine  for 90 days at current dose      Return if symptoms worsen or fail to improve.    Oluwanifemi Susman T Lousie Calico, PA-C

## 2024-05-24 NOTE — Patient Instructions (Signed)
 It was nice to see you today!  If you have any problems before your next visit feel free to message me via MyChart (minor issues or questions) or call the office, otherwise you may reach out to schedule an office visit.  Thank you! Pau Banh, PA-C

## 2024-05-30 ENCOUNTER — Encounter: Payer: Self-pay | Admitting: Internal Medicine

## 2024-05-30 ENCOUNTER — Ambulatory Visit: Payer: Self-pay | Admitting: Internal Medicine

## 2024-05-30 ENCOUNTER — Ambulatory Visit (INDEPENDENT_AMBULATORY_CARE_PROVIDER_SITE_OTHER): Admitting: Internal Medicine

## 2024-05-30 VITALS — BP 130/67 | HR 73 | Temp 97.9°F | Ht 72.0 in | Wt 208.2 lb

## 2024-05-30 DIAGNOSIS — Z8701 Personal history of pneumonia (recurrent): Secondary | ICD-10-CM | POA: Diagnosis not present

## 2024-05-30 DIAGNOSIS — J441 Chronic obstructive pulmonary disease with (acute) exacerbation: Secondary | ICD-10-CM

## 2024-05-30 DIAGNOSIS — Z09 Encounter for follow-up examination after completed treatment for conditions other than malignant neoplasm: Secondary | ICD-10-CM

## 2024-05-30 DIAGNOSIS — J449 Chronic obstructive pulmonary disease, unspecified: Secondary | ICD-10-CM

## 2024-05-30 LAB — CBC WITH DIFFERENTIAL/PLATELET
Basophils Absolute: 0 K/uL (ref 0.0–0.1)
Basophils Relative: 0.4 % (ref 0.0–3.0)
Eosinophils Absolute: 0.1 K/uL (ref 0.0–0.7)
Eosinophils Relative: 1.5 % (ref 0.0–5.0)
HCT: 32.9 % — ABNORMAL LOW (ref 39.0–52.0)
Hemoglobin: 10.4 g/dL — ABNORMAL LOW (ref 13.0–17.0)
Lymphocytes Relative: 11 % — ABNORMAL LOW (ref 12.0–46.0)
Lymphs Abs: 0.7 K/uL (ref 0.7–4.0)
MCHC: 31.7 g/dL (ref 30.0–36.0)
MCV: 87.3 fl (ref 78.0–100.0)
Monocytes Absolute: 0.5 K/uL (ref 0.1–1.0)
Monocytes Relative: 7.9 % (ref 3.0–12.0)
Neutro Abs: 4.7 K/uL (ref 1.4–7.7)
Neutrophils Relative %: 79.2 % — ABNORMAL HIGH (ref 43.0–77.0)
Platelets: 210 K/uL (ref 150.0–400.0)
RBC: 3.77 Mil/uL — ABNORMAL LOW (ref 4.22–5.81)
RDW: 16.3 % — ABNORMAL HIGH (ref 11.5–15.5)
WBC: 5.9 K/uL (ref 4.0–10.5)

## 2024-05-30 NOTE — Progress Notes (Signed)
 OV 10/05/2023  Subjective:  Patient ID: [name], male , DOB: Jul 21, 1951 , age 73 y.o. , MRN: [id number] , ADDRESS: [address] [place] [address] PCP Cleotilde Bernardino Hutchinson, PA Patient Care Team: Cleotilde Bernardino Hutchinson, [name] as PCP - General (Physician Assistant) Cornelius Wanda DEL, MD as Consulting Physician (Hematology and Oncology)  This Provider for this visit: Treatment Team:  Attending Provider: Geronimo Amel, MD    10/05/2023 -   Chief Complaint  Patient presents with   Follow-up    Pft f/u      HPI [name] 73 y.o. -returns for follow-up.  Last visit.  Saw me after having had a flareup and admission since then no hospitalizations no emergency room visits no prednisone  no urgent care visits.  He has seen Dr. Sherrod for his lung cancer surveillance.  He had a CT chest December 2020 for that I personally visualized he has right upper lobe old consolidation but other than that no evidence of cancer recurrence.  He states that he had low magnesium  and this was corrected in September 2024 and since then his health improved but he wants to get chemistry panel checked today.  This visit wanted to make sure his lung function was stable therefore we got a pulmonary function test but surprisingly his FEV1 and DLCO are much improved compared to 2017 he was pleasantly surprised.  He started joking that he was going to join the track team.  At this point we resolved that he would just continue Trelegy and we would keep an eye.  External record reviwed     CT Chest data from date: December 2024  - personally visualized and independently interpreted : Yes - my findings are: Right upper consolidation old and no evidence of cancer recurrence.  Latest Reference Range & Units 05/01/08 06:10 05/29/08 10:51 06/26/08 15:04 07/24/08 13:48 02/26/09 08:43 09/26/10 09:13 10/19/10 15:54 10/20/10 07:05 11/23/11 12:44 09/30/15 07:20 09/30/15 12:23 10/08/15 13:59 10/15/15 11:52  10/22/15 08:38 10/29/15 11:04 11/05/15 11:14 11/12/15 08:05 11/19/15 10:51 11/26/15 11:14 12/03/15 08:31 12/10/15 08:38 12/17/15 08:33 12/24/15 08:13 12/31/15 08:46 01/07/16 09:04 01/14/16 08:44 01/14/16 10:29 01/29/16 09:46 02/05/16 09:06 02/12/16 09:06 02/19/16 09:09 02/27/16 09:18 05/28/16 09:20 08/27/16 08:53 03/02/17 07:40 09/07/17 13:22 09/30/17 04:21 10/01/17 04:28 10/02/17 05:00 03/10/18 08:26 09/06/18 08:25 03/09/19 09:59 09/07/19 08:12 10/03/19 12:36 02/12/20 10:02 06/14/20 09:25 11/07/20 14:50 12/13/20 10:26 03/10/21 12:55 09/08/21 07:35 07/07/22 14:44 07/10/22 16:47 02/11/23 10:42 02/12/23 09:40 05/06/23 16:03 05/26/23 21:21 06/22/23 10:26 08/25/23 15:20  Eosinophils Absolute 0.0 - 0.5 K/uL 0.0 0.5 0.2 0.2 0.2 0.1 0.0 0.1 0.3 0.2 0.2 0.3 0.1 0.1 0.1 0.1 0.2 0.1 0.1 0.1 0.1 0.1 0.0 0.1 0.1 0.1 0.0 0.4 0.5 0.3 0.1 0.1 0.3 0.2 0.2 0.3 0.0 0.2 0.2 0.3 0.1 0.2 0.1 0.3 0.1 0.2 0.0 0.4 0.2 0.2 0.3 0.3 0.1 0.1 0.0 0.2 0.1 0.0     IMPRESSION: 1. Stable dense right apical consolidation surrounding the fiducials and likely radiation change and chronic bronchial obstruction. 2. Stable borderline enlarged mediastinal nodes. 3. No findings metastatic disease. 4. Stable 10 mm right adrenal gland adenoma. 5. Stable complex lesion involving the spleen. 6. Stable simple bilateral renal cysts and upper pole right renal calculi.   Aortic Atherosclerosis (ICD10-I70.0) and Emphysema (ICD10-J43.9).     Electronically Signed   By: MYRTIS Stammer M.D.   On: 09/04/2023 10:17 PFT  OV 03/21/2024  Subjective:  Patient ID: [name], male , DOB:  11/10/1950 , age 40 y.o. , MRN: [id number] , ADDRESS: [address] [place] [facility] [address] PCP [name], Lang DASEN, PA-C Patient Care Team: [name], Jacob T, PA-C as PCP - General (Physician Assistant) Cornelius Wanda DEL, MD as Consulting Physician (Hematology and Oncology)  This Provider for this visit: Treatment Team:  Attending Provider: Geronimo Amel, MD    03/21/2024 -   Chief Complaint  Patient presents with   Follow-up    Breathing is overall stable. He coughs up some light grey sputum in the am.      HPI [name] 72 y.o. -returns for routine follow-up.  Since his last visit he had a CT scan of the chest that a new 4 mm nodules.  Has upcoming follow-up with Dr. Sherrod.  From a respiratory standpoint he is stable without any flareup.  His symptoms show COPD CAT score of 8.  Nevertheless he is interested in evaluating Dupixent is a therapy for him.  He is open to getting CBC with differential.  Of note he is also on lisinopril  which could cause chronic cough.  He does not much complains about chronic cough.      03/21/2024    1:16 PM  CAT Score  Total CAT Score 8    OV 05/30/2024  Subjective:  Patient ID: [name], male , DOB: 02/12/1951 , age 48 y.o. , MRN: [id number] , ADDRESS: [address] [place] [address] PCP [name], Lang DASEN, PA-C Patient Care Team: [name], Lang DASEN RIGGERS as PCP - General (Physician Assistant) Cornelius Wanda DEL, MD as Consulting Physician (Hematology and Oncology)  This Provider for this visit: Treatment Team:  Attending Provider: Geronimo Amel, MD    05/30/2024 -   Chief Complaint  Patient presents with   Follow-up    Hospitalized 04/17/24 - 04/21/24 for pneumonia Pt states he is unable to stay off his o2 for long because his sats keep dropping. Pt states antibiotics from  PCP have helped.      HPI [name] 73 y.o. -returns for follow-up.  Presents with his wife who is independent historian today.  After I saw him early July 2024 he got admitted mid July 2025 with Acinetobacter pneumonia and then rehospitalized again because of some Lasix  related issues.  He had acute hypoxemic respiratory failure.  He had significant consolidation in his left lower lobe.  A person visualized the CT.  At this point in time he is improved.  He had a CT chest May 24, 2024  and my personal visualization the consolidation in the lower lobe is resolved but the official report is pending.  He says he is much better but he is having exertional hypoxemia.  He says he easily desaturates even walking 5 feet but the wife is concerned that the pulse oximeter he is using is defective.  We walked him today on room air to 100 feet with a Masimo FDA-approved pulse oximeter and he did desaturate 86% at 200 feet.  But otherwise he held up his oxygen .  Otherwise he is doing well he continues Trelegy.  He is was interested in biologic but his July eosinophils were normal.  Will recheck that today.  I talked him about the nebulizer OTHUVAYRE      Lab review at hospital shows anemia - hgb 10gm%  Simple office walk 224 (66+46 x 2) feet Pod A at [facility] x  3 laps goal with forehead probe 07/12/2023    O2 used ra  Number laps completed Just 1   Comments about pace normal   Resting Pulse Ox/HR 96% and x/min   Final Pulse Ox/HR 94% and x/min   Desaturated </= 88% no   Desaturated <= 3% points no   Got Tachycardic >/= 90/min nx   Symptoms at end of test no   Miscellaneous comments no    CT Chest data from date: *05/24/24  - personally visualized and independently interpreted : YES - my findings are: resolved pneumonia (official report pending)    PFT     Latest Ref Rng & Units 10/05/2023    9:39 AM 12/18/2015    9:44 AM  PFT Results  FVC-Pre L 3.02  2.75   FVC-Predicted Pre % 64  55   FVC-Post L  2.99   FVC-Predicted Post %  60   Pre FEV1/FVC % % 57  56   Post FEV1/FCV % %  56   FEV1-Pre L 1.74  1.53   FEV1-Predicted Pre % 51  41   FEV1-Post L  1.66   DLCO uncorrected ml/min/mmHg 15.41  11.85   DLCO UNC% % 56  33   DLCO corrected ml/min/mmHg 17.64  13.96   DLCO COR %Predicted % 65  39   DLVA Predicted % 88  65   TLC L  6.25   TLC % Predicted %  84   RV % Predicted %  143        LAB RESULTS last 96 hours No results found.    Latest Reference Range &  Units 05/01/08 06:10 05/29/08 10:51 06/26/08 15:04 07/24/08 13:48 02/26/09 08:43 09/26/10 09:13 10/19/10 15:54 10/20/10 07:05 11/23/11 12:44 09/30/15 07:20 09/30/15 12:23 10/08/15 13:59 10/15/15 11:52 10/22/15 08:38 10/29/15 11:04 11/05/15 11:14 11/12/15 08:05 11/19/15 10:51 11/26/15 11:14 12/03/15 08:31 12/10/15 08:38 12/17/15 08:33 12/24/15 08:13 12/31/15 08:46 01/07/16 09:04 01/14/16 08:44 01/14/16 10:29 01/29/16 09:46 02/05/16 09:06 02/12/16 09:06 02/19/16 09:09 02/27/16 09:18 05/28/16 09:20 08/27/16 08:53 03/02/17 07:40 09/07/17 13:22 09/30/17 04:21 10/01/17 04:28 10/02/17 05:00 03/10/18 08:26 09/06/18 08:25 03/09/19 09:59 09/07/19 08:12 10/03/19 12:36 02/12/20 10:02 06/14/20 09:25 11/07/20 14:50 12/13/20 10:26 03/10/21 12:55 09/08/21 07:35 07/07/22 14:44 07/10/22 16:47 02/11/23 10:42 02/12/23 09:40 05/06/23 16:03 05/26/23 21:21 06/22/23 10:26 08/25/23 15:20 02/11/24 14:54 03/21/24 13:51 03/30/24 21:57  Eosinophils Absolute 0.0 - 0.5 K/uL 0.0 0.5 0.2 0.2 0.2 0.1 0.0 0.1 0.3 0.2 0.2 0.3 0.1 0.1 0.1 0.1 0.2 0.1 0.1 0.1 0.1 0.1 0.0 0.1 0.1 0.1 0.0 0.4 0.5 0.3 0.1 0.1 0.3 0.2 0.2 0.3 0.0 0.2 0.2 0.3 0.1 0.2 0.1 0.3 0.1 0.2 0.0 0.4 0.2 0.2 0.3 0.3 0.1 0.1 0.0 0.2 0.1 0.0 0.1 0.1 0.0      has a past medical history of Abnormal finding on GI tract imaging (09/07/2023), Acute diastolic CHF (congestive heart failure) (HCC) (05/07/2023), Acute on chronic diastolic CHF (congestive heart failure) (HCC) (05/06/2023), Acute on chronic respiratory failure with hypoxia (HCC) (05/06/2023), Aortic atherosclerosis (HCC) (01/26/2023), Arthritis, Atrial fibrillation (HCC) (10/26/2011), Benign neoplasm of colon (09/07/2023), BPH (benign prostatic hyperplasia) (2011), BRONCHOPNEUMONIA ORGANISM UNSPECIFIED (06/12/2010), Carpal tunnel syndrome, right upper limb (11/03/2022), Chronic respiratory failure with hypoxia (HCC) (08/27/2015), Congestive heart failure (HCC), COPD (chronic obstructive pulmonary disease) (HCC), COPD with  acute exacerbation (HCC) (05/26/2023), Coronary artery calcification (01/26/2023), Cubital tunnel syndrome on right (07/09/2023), Depression, DOE (dyspnea on exertion) (04/07/2023), Essential hypertension (06/05/2008), GERD (gastroesophageal reflux disease), Hypothyroidism (03/30/2012), Lesion of ulnar nerve, right upper limb (11/03/2022), Lymphadenopathy syndrome (09/12/2015), Mediastinal adenopathy (09/12/2015), NHL (non-Hodgkin's lymphoma) (HCC) (09/25/2015),  Numbness of right hand (06/28/2022), Obesity, Open wound of right hand without foreign body (07/27/2021), Other dysphagia (11/11/2010), Polycythemia (01/26/2023), Port catheter in place (08/20/2016), Pulmonary hypertension (HCC), Pure hypercholesterolemia (12/03/2011), Recurrent pneumonia (02/10/2012), Routine general medical examination at a health care facility (12/03/2011), Squamous cell carcinoma lung (HCC) (09/26/2010), Status post bronchoscopy with biopsy, Stiffness of finger joint of right hand (09/24/2021), and Testicular swelling (09/12/2015).   reports that he quit smoking about 22 years ago. His smoking use included cigarettes. He started smoking about 65 years ago. He has a 129 pack-year smoking history. He has been exposed to tobacco smoke. He has never used smokeless tobacco.  Past Surgical History:  Procedure Laterality Date   APPENDECTOMY     BACK SURGERY     CARDIOVERSION  11/27/2011   Procedure: CARDIOVERSION;  Surgeon: Aleene JINNY Passe, MD;  Location: [facility] ENDOSCOPY;  Service: Cardiovascular;  Laterality: N/A;   COLONOSCOPY     COLONOSCOPY WITH PROPOFOL  N/A 09/07/2023   Procedure: COLONOSCOPY WITH PROPOFOL ;  Surgeon: Leigh Elspeth SQUIBB, MD;  Location: [facility] ENDOSCOPY;  Service: Gastroenterology;  Laterality: N/A;   FUDUCIAL PLACEMENT Right 10/13/2019   Procedure: Placement Of Fuducial;  Surgeon: Shelah Lamar RAMAN, MD;  Location: [facility] OR;  Service: Thoracic;  Laterality: Right;  Right Upper Lobe    LEFT HEART CATH AND CORONARY  ANGIOGRAPHY N/A 04/12/2023   Procedure: LEFT HEART CATH AND CORONARY ANGIOGRAPHY;  Surgeon: Anner Alm ORN, MD;  Location: [facility] INVASIVE CV LAB;  Service: Cardiovascular;  Laterality: N/A;   LUNG CANCER SURGERY  2011   POLYPECTOMY  09/07/2023   Procedure: POLYPECTOMY;  Surgeon: Leigh Elspeth SQUIBB, MD;  Location: [facility] ENDOSCOPY;  Service: Gastroenterology;;   UPPER GI ENDOSCOPY     VIDEO BRONCHOSCOPY WITH ENDOBRONCHIAL NAVIGATION N/A 10/13/2019   Procedure: VIDEO BRONCHOSCOPY WITH ENDOBRONCHIAL NAVIGATION;  Surgeon: Shelah Lamar RAMAN, MD;  Location: [facility] OR;  Service: Thoracic;  Laterality: N/A;   VIDEO BRONCHOSCOPY WITH ENDOBRONCHIAL ULTRASOUND N/A 10/13/2019   Procedure: VIDEO BRONCHOSCOPY WITH ENDOBRONCHIAL ULTRASOUND;  Surgeon: Shelah Lamar RAMAN, MD;  Location: [facility] OR;  Service: Thoracic;  Laterality: N/A;    Allergies  Allergen Reactions   Hydrochlorothiazide  Other (See Comments)    Dried out the patient too much   Lipitor [Atorvastatin ] Other (See Comments)    Myalgias  Weakness  Fatigue    Omnipaque  [Iohexol ] Itching and Other (See Comments)    When asked about contrast media today, pt stated that last time he had the contrast that he was red all over and itchy from head to toe.    Tape Other (See Comments)    Band-Aids irritate the skin    Immunization History  Administered Date(s) Administered   Fluad Trivalent(High Dose 65+) 05/28/2023   INFLUENZA, HIGH DOSE SEASONAL PF 06/10/2017, 07/27/2018, 06/22/2019   Influenza Whole 06/21/2009, 08/30/2011   Influenza-Unspecified 09/16/2015, 05/13/2016   PNEUMOCOCCAL CONJUGATE-20 03/03/2021   Pneumococcal Conjugate-13 08/06/2017   Pneumococcal Polysaccharide-23 05/22/2008   Tdap 12/03/2011, 12/23/2021    Family History  Problem Relation Age of Onset   Heart disease Father    Cancer Neg Hx    Diabetes Neg Hx    Stroke Neg Hx    Hypertension Neg Hx    Kidney disease Neg Hx      Current Outpatient Medications:    albuterol   (PROVENTIL ) (2.5 MG/3ML) 0.083% nebulizer solution, Take 3 mLs (2.5 mg total) by nebulization every 6 (six) hours as needed for wheezing or shortness of breath., Disp: 1080 mL, Rfl: 3  albuterol  (VENTOLIN  HFA) 108 (90 Base) MCG/ACT inhaler, Inhale 2 puffs into the lungs every 6 (six) hours as needed for wheezing or shortness of breath., Disp: , Rfl:    allopurinol  (ZYLOPRIM ) 300 MG tablet, Take 300 mg by mouth daily., Disp: , Rfl:    amLODipine  (NORVASC ) 5 MG tablet, Take 1 tablet (5 mg total) by mouth daily., Disp: 90 tablet, Rfl: 3   apixaban  (ELIQUIS ) 5 MG TABS tablet, Take 1 tablet by mouth twice daily, Disp: 180 tablet, Rfl: 1   betamethasone , augmented, (DIPROLENE ) 0.05 % lotion, Apply 1 Application topically daily as needed (dry skin on scalp)., Disp: , Rfl:    budesonide -glycopyrrolate -formoterol  (BREZTRI ) 160-9-4.8 MCG/ACT AERO inhaler, Inhale 2 puffs into the lungs 2 (two) times daily., Disp: 10.7 g, Rfl: 0   Buprenorphine HCl-Naloxone HCl 8-2 MG FILM, Place 0.33-1 Film under the tongue 2 (two) times daily as needed (back pain). (Patient taking differently: Place 0.33-1 Film under the tongue as needed (back pain). Weekly as needed), Disp: , Rfl:    ciclopirox  (PENLAC ) 8 % solution, Apply topically at bedtime. Apply a thin coat over nail. Apply daily over previous coat. Remove weekly with nail polish remover. (Patient taking differently: Apply 1 application  topically See admin instructions. Apply a thin coat over toenails at bedtime- over previous coat. Remove weekly with nail polish remover.), Disp: 6.6 mL, Rfl: 11   ezetimibe  (ZETIA ) 10 MG tablet, Take 10 mg by mouth daily., Disp: , Rfl:    Ferrous Sulfate  (IRON ) 325 (65 Fe) MG TABS, Take 1 tablet (325 mg total) by mouth daily., Disp: 30 tablet, Rfl: 0   finasteride  (PROSCAR ) 5 MG tablet, Take 5 mg by mouth daily., Disp: , Rfl:    furosemide  (LASIX ) 20 MG tablet, Take 1 tablet by mouth once daily, Disp: 30 tablet, Rfl: 11   levothyroxine   (SYNTHROID ) 175 MCG tablet, Take 175 mcg by mouth daily before breakfast., Disp: , Rfl:    LINZESS  290 MCG CAPS capsule, Take 1 capsule (290 mcg total) by mouth daily before breakfast., Disp: 30 capsule, Rfl: 5   Magnesium  Chloride 64 MG TABS, Take 64 mg by mouth daily in the afternoon., Disp: 90 tablet, Rfl: 3   metoprolol  succinate (TOPROL  XL) 25 MG 24 hr tablet, Take 1 tablet (25 mg total) by mouth daily., Disp: 90 tablet, Rfl: 0   omeprazole  (PRILOSEC) 20 MG capsule, Take 1 capsule (20 mg total) by mouth 2 (two) times daily., Disp: , Rfl:    OXYGEN , Inhale 2 L/min into the lungs See admin instructions. Inhale 2 L/min into the lungs at bedtime and as needed for shortness of breath during the daytime (Patient taking differently: Inhale 2-3 L/min into the lungs See admin instructions. Inhale 2 -3 L/min into the lungs at bedtime and as needed for shortness of breath during the daytime), Disp: , Rfl:    sodium chloride  (OCEAN) 0.65 % SOLN nasal spray, Place 1 spray into both nostrils as needed for congestion., Disp: , Rfl:    zolpidem  (AMBIEN ) 10 MG tablet, Take 1 tablet (10 mg total) by mouth at bedtime as needed for sleep., Disp: 30 tablet, Rfl: 0   amoxicillin -clavulanate (AUGMENTIN ) 875-125 MG tablet, Take 1 tablet by mouth 2 (two) times daily. (Patient not taking: Reported on 05/30/2024), Disp: , Rfl:       Objective:   Vitals:   05/30/24 1032  BP: 130/67  Pulse: 73  Temp: 97.9 F (36.6 C)  SpO2: (!) 73%  Weight: 208 lb  3.2 oz (94.4 kg)  Height: 6' (1.829 m)    Estimated body mass index is 28.24 kg/m as calculated from the following:   Height as of this encounter: 6' (1.829 m).   Weight as of this encounter: 208 lb 3.2 oz (94.4 kg).  @WEIGHTCHANGE @  [employer]   05/30/24 1032  Weight: 208 lb 3.2 oz (94.4 kg)     Physical Exam   General: No distress. Looks wel O2 at rest: no Cane present: no Sitting in wheel chair: no Frail: no Obese: no Neuro: [name] and Oriented x  3. GCS 15. Speech normal Psych: Pleasant Resp:  Barrel Chest - yes.  Wheeze - no, Crackles - no, No overt respiratory distress CVS: Normal heart sounds. Murmurs - no Ext: Stigmata of Connective Tissue Disease - no HEENT: Normal upper airway. PEERL +. No post nasal drip        Assessment/     Assessment & Plan COPD, severe (HCC)  COPD, frequent exacerbations [facility])  Hospital discharge follow-up  History of bacterial pneumonia    PLAN Patient Instructions     ICD-10-CM   1. COPD, severe (HCC)  J44.9     2. COPD, frequent exacerbations (HCC)  J44.1     3. Hospital discharge follow-up  Z09     4. History of bacterial pneumonia  Z87.01       Hospital discharge follow-up History of bacterial pneumonia - acinetobacter mid July 2025 History anemia in hospital  - CT scna 05/24/24 results pending but to me it look like pneumonia has full healed  plan - await CT chest report - check cbc with diff  COPD, severe (HCC) COPD, frequent exacerbations (HCC)  - Currently stable.   - did not qualify for Nucala or Dupixent in July 2025  Plan  - continue trelegy - START OTHUVAYRE - Check CBC with differential for blood eosinophil count to consider Dupixent or Nucala for the future  - continue night o2 - flu shot and covid shot in fall 2025  Exercise hypoxemia   - your home monitor might be defective - this is improved but desaturaing at 86% at 200 feet  Plan - continue portable o2 for exertional pulse ox < 88% which iis > 200 feet - BUT use a better pulse oximeter     Followup   - 3 months or sooner if needed - visit APP    FOLLOWUP    Return in about 3 months (around 08/29/2024) for with any of the APPS.    SIGNATURE    Dr. Dorethia Cave, M.D., F.C.C.P,  Pulmonary and Critical Care Medicine Staff Physician, [facility] Center Director - Interstitial Lung Disease  Program  Pulmonary Fibrosis [facility] at [facility] [place], [place], [address]  Pager: [phone], If no answer or between  15:00h - 7:00h: call [phone] Telephone: [phone]  11:21 AM 05/30/2024

## 2024-05-30 NOTE — Progress Notes (Signed)
 Chronic anemia - no change x 2 months . Eosinophils normal

## 2024-05-30 NOTE — Patient Instructions (Addendum)
 ICD-10-CM   1. COPD, severe (HCC)  J44.9     2. COPD, frequent exacerbations (HCC)  J44.1     3. Hospital discharge follow-up  Z09     4. History of bacterial pneumonia  Z87.01       Hospital discharge follow-up History of bacterial pneumonia - acinetobacter mid July 2025 History anemia in hospital  - CT scna 05/24/24 results pending but to me it look like pneumonia has full healed  plan - await CT chest report - check cbc with diff  COPD, severe (HCC) COPD, frequent exacerbations (HCC)  - Currently stable.   - did not qualify for Nucala or Dupixent in July 2025  Plan  - continue trelegy - START OTHUVAYRE - Check CBC with differential for blood eosinophil count to consider Dupixent or Nucala for the future  - continue night o2 - flu shot and covid shot in fall 2025  Exercise hypoxemia   - your home monitor might be defective - this is improved but desaturaing at 86% at 200 feet  Plan - continue portable o2 for exertional pulse ox < 88% which iis > 200 feet - BUT use a better pulse oximeter     Followup   - 3 months or sooner if needed - visit APP

## 2024-06-02 ENCOUNTER — Telehealth: Payer: Self-pay

## 2024-06-02 NOTE — Telephone Encounter (Signed)
 Received fax from [employer] confirming receipt of Ohtuvayre  enrollment form  Patient ID: [id number]

## 2024-06-02 NOTE — Telephone Encounter (Signed)
 Received Ohtuvayre  new start paperwork. Completed form and faxed with clinicals and insurance card copy to [facility]   Phone#: [phone] Fax#: [phone]

## 2024-06-08 ENCOUNTER — Encounter: Payer: Self-pay | Admitting: Internal Medicine

## 2024-06-12 ENCOUNTER — Ambulatory Visit: Payer: Self-pay

## 2024-06-12 NOTE — Telephone Encounter (Signed)
 FYI Only or Action Required?: FYI only for provider.  Patient was last seen in primary care on 05/24/2024 by [name], Lang DASEN, PA-C.  Called Nurse Triage reporting Foreign Body in Skin.  Symptoms began several days ago.  Interventions attempted: Nothing.  Symptoms are: unchanged.  Triage Disposition: See Physician Within 24 Hours  Patient/caregiver understands and will follow disposition?: Yes    Copied from CRM [id number]. Topic: Clinical - Red Word Triage >> Jun 12, 2024  8:57 AM Antwanette L wrote: Red Word that prompted transfer to Nurse Triage: Patient has a splinter in his right hand its causing redness, sensitive to movement and pain Reason for Disposition  [1] Wound looks infected (e.g., redness, red streaks, pus, swollen) AND [2] no fever  Answer Assessment - Initial Assessment Questions Patient scheduled an appointment for tomorrow via MyChart, but is wanting to be seen sooner. He says his hand has redness around the splinter, a little swelling around that area. Safety pin trying to pick it out, alcohol  used. Patient says Is there something y'all can do for me? As I was about to tell him I will see if there is an availabilty today, he says he's not going to go through this, I will just keep my appointment tomorrow, then hung up the phone. I called him back to offer the 2 appointments with PCP seen today, no answer, left VM to return the call if he wants the appointment. Unable to give any care advice.   1. MECHANISM: How did it happen?      Picked up a Systems developer 2. LOCATION: Where is the FB (e.g., splinter) located?      Splinter right below the thumb 2 inches from the wrist 3. OBJECT: What type of FB was it?      Splinter approximately 5/8 long or less 4. DEPTH: How deep do you think the FB goes?      Just below the skin 5. ONSET: When did the injury occur? (Minutes or hours)      Saturday evening 6. PAIN: Is it painful? If Yes, ask: How bad is  the pain?  (Scale 1-10; or mild, moderate, severe)     10 with use of hand, 3 when not using hand 7. TETANUS: When was your last tetanus booster?     Last 5 years  Protocols used: Skin Foreign Body-A-AH

## 2024-06-13 ENCOUNTER — Ambulatory Visit (HOSPITAL_BASED_OUTPATIENT_CLINIC_OR_DEPARTMENT_OTHER): Admitting: Student

## 2024-06-13 NOTE — Telephone Encounter (Signed)
 Copied from CRM #[id number]. Topic: Appointments - Appointment Cancel/Reschedule >> Jun 13, 2024  7:59 AM Charlet HERO wrote: Patient/patient representative is calling to cancel n appointment. Refer to attachments for appointment information.

## 2024-06-15 ENCOUNTER — Inpatient Hospital Stay: Attending: Internal Medicine

## 2024-06-15 DIAGNOSIS — Z8572 Personal history of non-Hodgkin lymphomas: Secondary | ICD-10-CM | POA: Diagnosis not present

## 2024-06-15 DIAGNOSIS — Z08 Encounter for follow-up examination after completed treatment for malignant neoplasm: Secondary | ICD-10-CM | POA: Diagnosis present

## 2024-06-15 DIAGNOSIS — C3491 Malignant neoplasm of unspecified part of right bronchus or lung: Secondary | ICD-10-CM

## 2024-06-15 DIAGNOSIS — Z85118 Personal history of other malignant neoplasm of bronchus and lung: Secondary | ICD-10-CM | POA: Diagnosis not present

## 2024-06-15 LAB — CMP (CANCER CENTER ONLY)
ALT: 17 U/L (ref 0–44)
AST: 20 U/L (ref 15–41)
Albumin: 4 g/dL (ref 3.5–5.0)
Alkaline Phosphatase: 98 U/L (ref 38–126)
Anion gap: 3 — ABNORMAL LOW (ref 5–15)
BUN: 24 mg/dL — ABNORMAL HIGH (ref 8–23)
CO2: 34 mmol/L — ABNORMAL HIGH (ref 22–32)
Calcium: 8.9 mg/dL (ref 8.9–10.3)
Chloride: 105 mmol/L (ref 98–111)
Creatinine: 1.01 mg/dL (ref 0.61–1.24)
GFR, Estimated: >60 mL/min (ref >60)
Glucose, Bld: 169 mg/dL — ABNORMAL HIGH (ref 70–99)
Potassium: 4.4 mmol/L (ref 3.5–5.1)
Sodium: 142 mmol/L (ref 135–145)
Total Bilirubin: 0.6 mg/dL (ref 0.0–1.2)
Total Protein: 6.4 g/dL — ABNORMAL LOW (ref 6.5–8.1)

## 2024-06-15 LAB — CBC WITH DIFFERENTIAL (CANCER CENTER ONLY)
Abs Immature Granulocytes: 0.03 K/uL (ref 0.00–0.07)
Basophils Absolute: 0 K/uL (ref 0.0–0.1)
Basophils Relative: 0 %
Eosinophils Absolute: 0 K/uL (ref 0.0–0.5)
Eosinophils Relative: 0 %
HCT: 33.2 % — ABNORMAL LOW (ref 39.0–52.0)
Hemoglobin: 10.3 g/dL — ABNORMAL LOW (ref 13.0–17.0)
Immature Granulocytes: 1 %
Lymphocytes Relative: 10 %
Lymphs Abs: 0.7 K/uL (ref 0.7–4.0)
MCH: 27.8 pg (ref 26.0–34.0)
MCHC: 31 g/dL (ref 30.0–36.0)
MCV: 89.7 fL (ref 80.0–100.0)
Monocytes Absolute: 0.5 K/uL (ref 0.1–1.0)
Monocytes Relative: 7 %
Neutro Abs: 5.3 K/uL (ref 1.7–7.7)
Neutrophils Relative %: 82 %
Platelet Count: 158 K/uL (ref 150–400)
RBC: 3.7 MIL/uL — ABNORMAL LOW (ref 4.22–5.81)
RDW: 14.9 % (ref 11.5–15.5)
WBC Count: 6.5 K/uL (ref 4.0–10.5)
nRBC: 0 % (ref 0.0–0.2)

## 2024-06-21 NOTE — Telephone Encounter (Signed)
 Received fax from [employer] with summary of benefits. Referral form for [place]  received. Rx will be triaged to [facility].. Once benefits investigation completed, pharmacy will reach out the patient to schedule shipment. If medication is unaffordable, patient will need to express financial hardship to be referred back to [facility] for patient assistance program pre-screening.   Patient ID: [id number] Pharmacy phone: [phone] [facility] Phone#: [phone]

## 2024-06-22 ENCOUNTER — Inpatient Hospital Stay: Attending: Internal Medicine | Admitting: Internal Medicine

## 2024-06-22 VITALS — BP 120/68 | HR 71 | Temp 97.3°F | Resp 17 | Ht 72.0 in | Wt 210.7 lb

## 2024-06-22 DIAGNOSIS — E039 Hypothyroidism, unspecified: Secondary | ICD-10-CM | POA: Diagnosis not present

## 2024-06-22 DIAGNOSIS — I110 Hypertensive heart disease with heart failure: Secondary | ICD-10-CM | POA: Diagnosis not present

## 2024-06-22 DIAGNOSIS — C3491 Malignant neoplasm of unspecified part of right bronchus or lung: Secondary | ICD-10-CM | POA: Diagnosis not present

## 2024-06-22 DIAGNOSIS — D7389 Other diseases of spleen: Secondary | ICD-10-CM | POA: Insufficient documentation

## 2024-06-22 DIAGNOSIS — Z8572 Personal history of non-Hodgkin lymphomas: Secondary | ICD-10-CM | POA: Diagnosis present

## 2024-06-22 DIAGNOSIS — Z79890 Hormone replacement therapy (postmenopausal): Secondary | ICD-10-CM | POA: Insufficient documentation

## 2024-06-22 DIAGNOSIS — M1990 Unspecified osteoarthritis, unspecified site: Secondary | ICD-10-CM | POA: Diagnosis not present

## 2024-06-22 DIAGNOSIS — I5033 Acute on chronic diastolic (congestive) heart failure: Secondary | ICD-10-CM | POA: Diagnosis not present

## 2024-06-22 DIAGNOSIS — I2510 Atherosclerotic heart disease of native coronary artery without angina pectoris: Secondary | ICD-10-CM | POA: Insufficient documentation

## 2024-06-22 DIAGNOSIS — J449 Chronic obstructive pulmonary disease, unspecified: Secondary | ICD-10-CM | POA: Diagnosis not present

## 2024-06-22 DIAGNOSIS — I272 Pulmonary hypertension, unspecified: Secondary | ICD-10-CM | POA: Diagnosis not present

## 2024-06-22 DIAGNOSIS — Z7901 Long term (current) use of anticoagulants: Secondary | ICD-10-CM | POA: Insufficient documentation

## 2024-06-22 DIAGNOSIS — Z85118 Personal history of other malignant neoplasm of bronchus and lung: Secondary | ICD-10-CM | POA: Diagnosis not present

## 2024-06-22 DIAGNOSIS — J9611 Chronic respiratory failure with hypoxia: Secondary | ICD-10-CM | POA: Diagnosis not present

## 2024-06-22 DIAGNOSIS — I700 Atherosclerosis of aorta: Secondary | ICD-10-CM | POA: Diagnosis not present

## 2024-06-22 DIAGNOSIS — Z9221 Personal history of antineoplastic chemotherapy: Secondary | ICD-10-CM | POA: Insufficient documentation

## 2024-06-22 DIAGNOSIS — Z79899 Other long term (current) drug therapy: Secondary | ICD-10-CM | POA: Diagnosis not present

## 2024-06-22 DIAGNOSIS — I4891 Unspecified atrial fibrillation: Secondary | ICD-10-CM | POA: Insufficient documentation

## 2024-06-22 DIAGNOSIS — N200 Calculus of kidney: Secondary | ICD-10-CM | POA: Insufficient documentation

## 2024-06-22 NOTE — Progress Notes (Signed)
 [facility] [phone]   [phone]  OFFICE PROGRESS NOTE  [name], [name] [address] [place] [address]  DIAGNOSIS:  1) stage IA (T1c, N0, M0) non-small cell lung cancer, squamous cell carcinoma diagnosed in January 2021 2) Stage III large B-cell non-Hodgkin lymphoma diagnosed in December 2016 and presented with extensive lymphadenopathy involving the neck, chest, abdomen as well as splenomegaly.   PRIOR THERAPY:  1) Systemic chemotherapy with the CHOP/Rituxan  every 3 weeks with Neulasta  support. Status post 6 cycles. 2) SBRT to the right upper lobe squamous cell carcinoma under the care of Dr. Shannon in February 2021.SABRA  CURRENT THERAPY: Observation.   INTERVAL HISTORY: [name] 73 y.o. male returns to the clinic today for 32-month follow-up visit.Discussed the use of AI scribe software for clinical note transcription with the patient, who gave verbal consent to proceed.  History of Present Illness [name] is a 73 year old male with stage IA non-small cell lung cancer and stage III large B-cell non-Hodgkin lymphoma who presents for evaluation with repeat CT scan of the chest for restaging of his disease. He is accompanied by his wife.  He has a history of stage IA non-small cell lung cancer, diagnosed in January 2021. He continues to require home oxygen , with adequate oxygen  levels at rest but experiences increased shortness of breath with exertion. No new chest pain has been noted since his last visit.  He also has a history of stage III large B-cell non-Hodgkin lymphoma, diagnosed in December 2016, for which he underwent systemic chemotherapy with CHOP/Rituxan . He is currently under observation for this condition.  During the review of symptoms, no new chest pain was reported.     MEDICAL HISTORY: Past Medical History:  Diagnosis Date   Abnormal finding on GI tract imaging 09/07/2023   Acute diastolic CHF  (congestive heart failure) (HCC) 05/07/2023   Acute on chronic diastolic CHF (congestive heart failure) (HCC) 05/06/2023   Acute on chronic respiratory failure with hypoxia (HCC) 05/06/2023   Aortic atherosclerosis 01/26/2023   Arthritis    Atrial fibrillation (HCC) 10/26/2011   Benign neoplasm of colon 09/07/2023   BPH (benign prostatic hyperplasia) 2011   BRONCHOPNEUMONIA ORGANISM UNSPECIFIED 06/12/2010         Replacing diagnoses that were inactivated after the 12/21/22 regulatory import   Carpal tunnel syndrome, right upper limb 11/03/2022   Chronic respiratory failure with hypoxia (HCC) 08/27/2015   Congestive heart failure (HCC)    COPD (chronic obstructive pulmonary disease) (HCC)    COPD with acute exacerbation (HCC) 05/26/2023   Coronary artery calcification 01/26/2023   Cubital tunnel syndrome on right 07/09/2023   Depression    DOE (dyspnea on exertion) 04/07/2023   Essential hypertension 06/05/2008   Qualifier: Diagnosis of   By: [name]), [name]       GERD (gastroesophageal reflux disease)    Hypothyroidism 03/30/2012   Lesion of ulnar nerve, right upper limb 11/03/2022   Lymphadenopathy syndrome 09/12/2015   Mediastinal adenopathy 09/12/2015   NHL (non-Hodgkin's lymphoma) (HCC) 09/25/2015   Numbness of right hand 06/28/2022   Obesity    Open wound of right hand without foreign body 07/27/2021   Other dysphagia 11/11/2010   Qualifier: Diagnosis of   By: [name], [name]       Polycythemia 01/26/2023   Port catheter in place 08/20/2016   Pulmonary hypertension (HCC)    Pure hypercholesterolemia 12/03/2011   Recurrent pneumonia 02/10/2012  Routine general medical examination at a health care facility 12/03/2011   Squamous cell carcinoma lung (HCC) 09/26/2010   S/p LUL resection Dr Brantley dec 2011      Status post bronchoscopy with biopsy    Stiffness of finger joint of right hand 09/24/2021   Testicular swelling 09/12/2015    ALLERGIES:  is allergic  to hydrochlorothiazide , lipitor [atorvastatin ], omnipaque  [iohexol ], and tape.  MEDICATIONS:  Current Outpatient Medications  Medication Sig Dispense Refill   albuterol  (PROVENTIL ) (2.5 MG/3ML) 0.083% nebulizer solution Take 3 mLs (2.5 mg total) by nebulization every 6 (six) hours as needed for wheezing or shortness of breath. 1080 mL 3   albuterol  (VENTOLIN  HFA) 108 (90 Base) MCG/ACT inhaler Inhale 2 puffs into the lungs every 6 (six) hours as needed for wheezing or shortness of breath.     allopurinol  (ZYLOPRIM ) 300 MG tablet Take 300 mg by mouth daily.     amLODipine  (NORVASC ) 5 MG tablet Take 1 tablet (5 mg total) by mouth daily. 90 tablet 3   amoxicillin -clavulanate (AUGMENTIN ) 875-125 MG tablet Take 1 tablet by mouth 2 (two) times daily.     apixaban  (ELIQUIS ) 5 MG TABS tablet Take 1 tablet by mouth twice daily 180 tablet 1   betamethasone , augmented, (DIPROLENE ) 0.05 % lotion Apply 1 Application topically daily as needed (dry skin on scalp).     budesonide -glycopyrrolate -formoterol  (BREZTRI ) 160-9-4.8 MCG/ACT AERO inhaler Inhale 2 puffs into the lungs 2 (two) times daily. 10.7 g 0   Buprenorphine HCl-Naloxone HCl 8-2 MG FILM Place 0.33-1 Film under the tongue 2 (two) times daily as needed (back pain). (Patient taking differently: Place 0.33-1 Film under the tongue as needed (back pain). Weekly as needed)     ciclopirox  (PENLAC ) 8 % solution Apply topically at bedtime. Apply a thin coat over nail. Apply daily over previous coat. Remove weekly with nail polish remover. (Patient taking differently: Apply 1 application  topically See admin instructions. Apply a thin coat over toenails at bedtime- over previous coat. Remove weekly with nail polish remover.) 6.6 mL 11   ezetimibe  (ZETIA ) 10 MG tablet Take 10 mg by mouth daily.     Ferrous Sulfate  (IRON ) 325 (65 Fe) MG TABS Take 1 tablet (325 mg total) by mouth daily. 30 tablet 0   finasteride  (PROSCAR ) 5 MG tablet Take 5 mg by mouth daily.      furosemide  (LASIX ) 20 MG tablet Take 1 tablet by mouth once daily 30 tablet 11   levothyroxine  (SYNTHROID ) 175 MCG tablet Take 175 mcg by mouth daily before breakfast.     LINZESS  290 MCG CAPS capsule Take 1 capsule (290 mcg total) by mouth daily before breakfast. 30 capsule 5   Magnesium  Chloride 64 MG TABS Take 64 mg by mouth daily in the afternoon. 90 tablet 3   metoprolol  succinate (TOPROL  XL) 25 MG 24 hr tablet Take 1 tablet (25 mg total) by mouth daily. 90 tablet 0   omeprazole  (PRILOSEC) 20 MG capsule Take 1 capsule (20 mg total) by mouth 2 (two) times daily.     OXYGEN  Inhale 2 L/min into the lungs See admin instructions. Inhale 2 L/min into the lungs at bedtime and as needed for shortness of breath during the daytime (Patient taking differently: Inhale 2-3 L/min into the lungs See admin instructions. Inhale 2 -3 L/min into the lungs at bedtime and as needed for shortness of breath during the daytime)     sodium chloride  (OCEAN) 0.65 % SOLN nasal spray Place 1 spray  into both nostrils as needed for congestion.     zolpidem  (AMBIEN ) 10 MG tablet Take 1 tablet (10 mg total) by mouth at bedtime as needed for sleep. 30 tablet 0   No current facility-administered medications for this visit.    SURGICAL HISTORY:  Past Surgical History:  Procedure Laterality Date   APPENDECTOMY     BACK SURGERY     CARDIOVERSION  11/27/2011   Procedure: CARDIOVERSION;  Surgeon: Aleene JINNY Passe, MD;  Location: [facility] ENDOSCOPY;  Service: Cardiovascular;  Laterality: N/A;   COLONOSCOPY     COLONOSCOPY WITH PROPOFOL  N/A 09/07/2023   Procedure: COLONOSCOPY WITH PROPOFOL ;  Surgeon: Leigh Elspeth SQUIBB, MD;  Location: [facility] ENDOSCOPY;  Service: Gastroenterology;  Laterality: N/A;   FUDUCIAL PLACEMENT Right 10/13/2019   Procedure: Placement Of Fuducial;  Surgeon: Shelah Lamar RAMAN, MD;  Location: [facility] OR;  Service: Thoracic;  Laterality: Right;  Right Upper Lobe    LEFT HEART CATH AND CORONARY ANGIOGRAPHY N/A 04/12/2023    Procedure: LEFT HEART CATH AND CORONARY ANGIOGRAPHY;  Surgeon: Anner Alm ORN, MD;  Location: [facility] INVASIVE CV LAB;  Service: Cardiovascular;  Laterality: N/A;   LUNG CANCER SURGERY  2011   POLYPECTOMY  09/07/2023   Procedure: POLYPECTOMY;  Surgeon: Leigh Elspeth SQUIBB, MD;  Location: [facility];  Service: Gastroenterology;;   UPPER GI ENDOSCOPY     VIDEO BRONCHOSCOPY WITH ENDOBRONCHIAL NAVIGATION N/A 10/13/2019   Procedure: VIDEO BRONCHOSCOPY WITH ENDOBRONCHIAL NAVIGATION;  Surgeon: Shelah Lamar RAMAN, MD;  Location: [facility] OR;  Service: Thoracic;  Laterality: N/A;   VIDEO BRONCHOSCOPY WITH ENDOBRONCHIAL ULTRASOUND N/A 10/13/2019   Procedure: VIDEO BRONCHOSCOPY WITH ENDOBRONCHIAL ULTRASOUND;  Surgeon: Shelah Lamar RAMAN, MD;  Location: [facility] OR;  Service: Thoracic;  Laterality: N/A;    REVIEW OF SYSTEMS:  A comprehensive review of systems was negative except for: Constitutional: positive for fatigue Respiratory: positive for dyspnea on exertion   PHYSICAL EXAMINATION: General appearance: alert, cooperative, appears stated age, fatigued and no distress Head: Normocephalic, without obvious abnormality, atraumatic Neck: no adenopathy, no JVD, supple, symmetrical, trachea midline and thyroid  not enlarged, symmetric, no tenderness/mass/nodules Lymph nodes: Cervical, supraclavicular, and axillary nodes normal. Resp: clear to auscultation bilaterally Back: symmetric, no curvature. ROM normal. No CVA tenderness. Cardio: regular rate and rhythm, S1, S2 normal, no murmur, click, rub or gallop GI: soft, non-tender; bowel sounds normal; no masses,  no organomegaly Extremities: extremities normal, atraumatic, no cyanosis or edema  ECOG PERFORMANCE STATUS: 1 - Symptomatic but completely ambulatory  Blood pressure 120/68, pulse 71, temperature (!) 97.3 F (36.3 C), resp. rate 17, height 6' (1.829 m), weight 210 lb 11.2 oz (95.6 kg).  LABORATORY DATA: Lab Results  Component Value Date   WBC 6.5 06/15/2024    HGB 10.3 (L) 06/15/2024   HCT 33.2 (L) 06/15/2024   MCV 89.7 06/15/2024   PLT 158 06/15/2024      Chemistry      Component Value Date/Time   NA 142 06/15/2024 0922   NA 142 04/14/2024 0829   NA 140 09/07/2017 1322   K 4.4 06/15/2024 0922   K 5.2 No visable hemolysis (H) 09/07/2017 1322   CL 105 06/15/2024 0922   CO2 34 (H) 06/15/2024 0922   CO2 33 (H) 09/07/2017 1322   BUN 24 (H) 06/15/2024 0922   BUN 27 04/14/2024 0829   BUN 13.6 09/07/2017 1322   CREATININE 1.01 06/15/2024 0922   CREATININE 1.0 09/07/2017 1322      Component Value Date/Time   CALCIUM  8.9  06/15/2024 0922   CALCIUM  9.1 09/07/2017 1322   ALKPHOS 98 06/15/2024 0922   ALKPHOS 92 09/07/2017 1322   AST 20 06/15/2024 0922   AST 11 09/07/2017 1322   ALT 17 06/15/2024 0922   ALT 8 09/07/2017 1322   BILITOT 0.6 06/15/2024 0922   BILITOT 0.47 09/07/2017 1322       RADIOGRAPHIC STUDIES: CT CHEST WO CONTRAST Result Date: 06/02/2024 CLINICAL DATA:  Non-small-cell lung cancer common non metastatic, assess treatment response. Monitor new inflammatory nodules with history of lung cancer. * Tracking Code: BO * EXAM: CT CHEST WITHOUT CONTRAST TECHNIQUE: Multidetector CT imaging of the chest was performed following the standard protocol without IV contrast. RADIATION DOSE REDUCTION: This exam was performed according to the departmental dose-optimization program which includes automated exposure control, adjustment of the mA and/or kV according to patient size and/or use of iterative reconstruction technique. COMPARISON:  Multiple priors including CT March 30, 2024 and April 17, 2024 FINDINGS: Cardiovascular: Aortic atherosclerosis. Enlarged main pulmonary artery. Three-vessel coronary artery calcifications. Normal size heart. Similar trace pericardial effusion. Mediastinum/Nodes: Similar size of the mediastinal and right hilar lymph nodes. For reference: -pre-vascular lymph node measures 10 mm in short axis on image 27/2,  unchanged. -high right paratracheal lymph node measures 10 mm in short axis on image 16/2, unchanged. Gas fluid levels in the esophagus. Lungs/Pleura: Similar rightward deviation of the mediastinum with loculated small right pleural effusion and masslike consolidation in the right upper lobe about fiducial markers as well as associated volume loss, no discretely measurable mass identified. No new suspicious nodularity. Stable changes of left upper lobe wedge resection. Near complete resolution of the airspace opacities in the right middle lobe and left-greater-than-right lower lobes now with some remnant ill-defined ground-glass and nodularity in the left lower lobe on image 57/2. Tiny scattered pulmonary nodules are stable. No new suspicious pulmonary nodules or masses. Similar extensive chronic lung changes. Calcified plaque in the right lung base. Upper Abdomen: Stable nonspecific heterogeneous splenic lesion measuring 3 cm on image 70/2. Bilateral renal cysts and renal lesions too small to accurately characterize. Nonobstructive renal stones. Musculoskeletal: No aggressive lytic or blastic lesion of bone. IMPRESSION: 1. Similar posttreatment change in the right lung apex. No discrete evidence of local recurrent disease. 2. Stable scattered pulmonary nodules. No new suspicious pulmonary nodules or masses. 3. Near complete resolution of the airspace opacities in the right middle lobe and left-greater-than-right lower lobes now with some remnant ill-defined ground-glass and nodularity in the left lower lobe. 4. Stable mediastinal and right hilar lymph nodes. 5. Stable nonspecific heterogeneous splenic lesion measuring 3 cm. 6. Gas fluid levels in the esophagus, suggestive of gastroesophageal reflux. 7. Aortic atherosclerosis. Aortic Atherosclerosis (ICD10-I70.0). Electronically Signed   By: Reyes Holder M.D.   On: 06/02/2024 07:40     ASSESSMENT AND PLAN:  This is a very pleasant 73 years old white male  with 1) stage IIIA large B-cell non-Hodgkin lymphoma status post 6 cycles of systemic chemotherapy with CHOP/Rituxan  with almost complete response. The patient has been on observation for several years. 2) stage IA (T1c, N0, M0) non-small cell lung cancer, squamous cell carcinoma presented with right upper lobe pulmonary mass.  This was found on repeat imaging studies with CT scan of the chest as well as a PET scan that showed hypermetabolic activity in the right upper lobe lung mass with no evidence for lymphadenopathy or distant metastatic disease. The patient underwent SBRT to the recurrent non-small cell lung cancer, squamous  cell carcinoma of the right upper lobe.  He tolerated the procedure well. He had repeat CT scan of the chest performed recently.  I personally independently reviewed the scan and this with the patient and his wife.  He has a scan showed no concerning finding for disease recurrence or metastasis. Assessment and Plan Assessment & Plan Stage IA non-small cell lung cancer Previously treated, currently without evidence of disease. Recent chest CT scan shows resolution of previous opacity, likely due to inflammation. - Return to six-month follow-up schedule. - Perform lab tests and chest CT scan one week prior to next appointment.  Stage III large B-cell non-Hodgkin lymphoma Previously treated with CHOP/Rituxan , currently on observation.  Chronic hypoxemic respiratory failure Managed with home oxygen . Oxygen  levels are adequate at rest but decrease with exertion. - Maintain oxygen  saturation above 90%. The patient was advised to call immediately if he has any concerning symptoms in the interval.  The patient voices understanding of current disease status and treatment options and is in agreement with the current care plan. All questions were answered. The patient knows to call the clinic with any problems, questions or concerns. We can certainly see the patient much sooner  if necessary. The total time spent in the appointment was 20 minutes.  Disclaimer: This note was dictated with voice recognition software. Similar sounding words can inadvertently be transcribed and may not be corrected upon review.

## 2024-06-23 ENCOUNTER — Ambulatory Visit: Admitting: Podiatry

## 2024-06-27 ENCOUNTER — Encounter

## 2024-07-12 ENCOUNTER — Ambulatory Visit (HOSPITAL_BASED_OUTPATIENT_CLINIC_OR_DEPARTMENT_OTHER)

## 2024-07-15 ENCOUNTER — Other Ambulatory Visit: Payer: Self-pay | Admitting: Cardiology

## 2024-07-15 DIAGNOSIS — I482 Chronic atrial fibrillation, unspecified: Secondary | ICD-10-CM

## 2024-07-17 NOTE — Telephone Encounter (Signed)
 Eliquis  5mg  refill request received. Patient is 73 years old, weight-95.6kg, Crea-1.01 on 06/15/24, Diagnosis-Afib, and last seen by Dr. Edwyna on 11/11/23. Dose is appropriate based on dosing criteria. Will send in refill to requested pharmacy.

## 2024-07-18 ENCOUNTER — Encounter: Payer: Self-pay | Admitting: Internal Medicine

## 2024-07-21 ENCOUNTER — Ambulatory Visit: Payer: Self-pay

## 2024-07-21 NOTE — Telephone Encounter (Signed)
 FYI Only or Action Required?: FYI only for provider: refused UC.  Patient was last seen in primary care on 05/24/2024 by [name], Lang DASEN, PA-C.  Called Nurse Triage reporting Shortness of Breath.  Symptoms began several days ago.  Interventions attempted: Prescription medications: inhalers, increased oxygen  by 0.5L.  Symptoms are: stable.  Triage Disposition: See Physician Within 24 Hours  Patient/caregiver understands and will follow disposition?: Unsure   Copied from CRM [id number]. Topic: Clinical - Red Word Triage >> Jul 21, 2024  9:13 AM Thersia BROCKS wrote: [facility] that prompted transfer to Nurse Triage: Chest tightness, nose running, shortness of breath Reason for Disposition  Caller hangs up  [1] MILD difficulty breathing (e.g., minimal/no SOB at rest, SOB with walking) AND [2] worse than normal  Answer Assessment - Initial Assessment Questions Additional info: 1) Followed by pulmonology, he has not contacted them re: shortness of breath and chest tightness.  2) No appointments are available in clinic until Nov 3, advised UC for sob. Patient became upset and stated I went through all this for nothing then disconnected call before this writer could say anything more.    1. MAIN CONCERN OR SYMPTOM: What's your main concern? (e.g., low oxygen  level, breathing difficulty) What question do you have?     Shortness of breath  2.  OXYGEN  EQUIPMENT:  Are you having trouble with your oxygen  equipment?  (e.g., cannula, mask, tubing, tank, concentrator)     no 3. ONSET: When did the  sob  start?      Few days 4. OXYGEN  THERAPY:      2L up to 2.5L today to maintain stats  5. PULSE OXIMETER:       6. O2 MONITORING: What is the oxygen  level (pulse ox reading)? (e.g., 70-100%)     94% 7. VITAL SIGNS MONITORING: Do you monitor/measure your oxygen  level or vital signs (e.g., yes, no, measurements are automatically sent to provider/call center). Document CURRENT and NORMAL  BASELINE values if available.        8. BREATHING DIFFICULTY: Are you having any difficulty breathing? If Yes, ask: How bad is it?  (e.g., none, mild, moderate, severe)      Speaking in full sentences  9. OTHER SYMPTOMS: Do you have any other symptoms? (e.g., fever, change in sputum)     Chest tightness-baseline when having SOB, does not feel different than usual.  10. SMOKING: Do you smoke currently? Is there anyone that smokes around you?  (Note: smoking around oxygen  is dangerous!)  Protocols used: COPD Oxygen  Monitoring and Hypoxia-A-AH, Difficult Call-A-AH

## 2024-07-24 ENCOUNTER — Other Ambulatory Visit: Payer: Self-pay | Admitting: Cardiology

## 2024-07-24 ENCOUNTER — Encounter: Payer: Self-pay | Admitting: Radiology

## 2024-07-24 NOTE — Telephone Encounter (Signed)
 Pt said he is feeling better from Friday. Said he has not checked O2 but does not need to be seen for now.

## 2024-07-28 ENCOUNTER — Ambulatory Visit: Admitting: Internal Medicine

## 2024-07-28 ENCOUNTER — Encounter: Payer: Self-pay | Admitting: Internal Medicine

## 2024-07-28 VITALS — BP 112/62 | HR 64 | Temp 98.1°F | Ht 72.0 in | Wt 216.0 lb

## 2024-07-28 DIAGNOSIS — J418 Mixed simple and mucopurulent chronic bronchitis: Secondary | ICD-10-CM

## 2024-07-28 DIAGNOSIS — K219 Gastro-esophageal reflux disease without esophagitis: Secondary | ICD-10-CM

## 2024-07-28 DIAGNOSIS — J4489 Other specified chronic obstructive pulmonary disease: Secondary | ICD-10-CM | POA: Diagnosis not present

## 2024-07-28 DIAGNOSIS — R0609 Other forms of dyspnea: Secondary | ICD-10-CM

## 2024-07-28 DIAGNOSIS — J449 Chronic obstructive pulmonary disease, unspecified: Secondary | ICD-10-CM

## 2024-07-28 DIAGNOSIS — R0902 Hypoxemia: Secondary | ICD-10-CM | POA: Diagnosis not present

## 2024-07-28 MED ORDER — SODIUM CHLORIDE 3 % IN NEBU: INHALATION_SOLUTION | Freq: Two times a day (BID) | RESPIRATORY_TRACT | 4 refills | Status: AC

## 2024-07-28 MED ORDER — AZELASTINE HCL 0.1 % NA SOLN: 2.0000 | Freq: Two times a day (BID) | NASAL | 12 refills | Status: AC

## 2024-07-28 NOTE — Progress Notes (Signed)
 OV 10/05/2023  Subjective:  Patient ID: [name] [name], male , DOB: Mar 27, 1951 , age 73 y.o. , MRN: [id number] , ADDRESS: [address] [place] [id number] PCP Cleotilde Bernardino Hutchinson, PA Patient Care Team: Cleotilde Bernardino Hutchinson, [name] as PCP - General (Physician Assistant) Cornelius Wanda DEL, MD as Consulting Physician (Hematology and Oncology)  This Provider for this visit: Treatment Team:  Attending Provider: Geronimo Amel, MD    10/05/2023 -   Chief Complaint  Patient presents with   Follow-up    Pft f/u      HPI [name] 73 y.o. -returns for follow-up.  Last visit.  Saw me after having had a flareup and admission since then no hospitalizations no emergency room visits no prednisone  no urgent care visits.  He has seen Dr. Sherrod for his lung cancer surveillance.  He had a CT chest December 2020 for that I personally visualized he has right upper lobe old consolidation but other than that no evidence of cancer recurrence.  He states that he had low magnesium  and this was corrected in September 2024 and since then his health improved but he wants to get chemistry panel checked today.  This visit wanted to make sure his lung function was stable therefore we got a pulmonary function test but surprisingly his FEV1 and DLCO are much improved compared to 2017 he was pleasantly surprised.  He started joking that he was going to join the track team.  At this point we resolved that he would just continue Trelegy and we would keep an eye.  External record reviwed     CT Chest data from date: December 2024  - personally visualized and independently interpreted : Yes - my findings are: Right upper consolidation old and no evidence of cancer recurrence.  Latest Reference Range & Units 05/01/08 06:10 05/29/08 10:51 06/26/08 15:04 07/24/08 13:48 02/26/09 08:43 09/26/10 09:13 10/19/10 15:54 10/20/10 07:05 11/23/11 12:44 09/30/15 07:20 09/30/15 12:23 10/08/15 13:59 10/15/15 11:52  10/22/15 08:38 10/29/15 11:04 11/05/15 11:14 11/12/15 08:05 11/19/15 10:51 11/26/15 11:14 12/03/15 08:31 12/10/15 08:38 12/17/15 08:33 12/24/15 08:13 12/31/15 08:46 01/07/16 09:04 01/14/16 08:44 01/14/16 10:29 01/29/16 09:46 02/05/16 09:06 02/12/16 09:06 02/19/16 09:09 02/27/16 09:18 05/28/16 09:20 08/27/16 08:53 03/02/17 07:40 09/07/17 13:22 09/30/17 04:21 10/01/17 04:28 10/02/17 05:00 03/10/18 08:26 09/06/18 08:25 03/09/19 09:59 09/07/19 08:12 10/03/19 12:36 02/12/20 10:02 06/14/20 09:25 11/07/20 14:50 12/13/20 10:26 03/10/21 12:55 09/08/21 07:35 07/07/22 14:44 07/10/22 16:47 02/11/23 10:42 02/12/23 09:40 05/06/23 16:03 05/26/23 21:21 06/22/23 10:26 08/25/23 15:20  Eosinophils Absolute 0.0 - 0.5 K/uL 0.0 0.5 0.2 0.2 0.2 0.1 0.0 0.1 0.3 0.2 0.2 0.3 0.1 0.1 0.1 0.1 0.2 0.1 0.1 0.1 0.1 0.1 0.0 0.1 0.1 0.1 0.0 0.4 0.5 0.3 0.1 0.1 0.3 0.2 0.2 0.3 0.0 0.2 0.2 0.3 0.1 0.2 0.1 0.3 0.1 0.2 0.0 0.4 0.2 0.2 0.3 0.3 0.1 0.1 0.0 0.2 0.1 0.0     IMPRESSION: 1. Stable dense right apical consolidation surrounding the fiducials and likely radiation change and chronic bronchial obstruction. 2. Stable borderline enlarged mediastinal nodes. 3. No findings metastatic disease. 4. Stable 10 mm right adrenal gland adenoma. 5. Stable complex lesion involving the spleen. 6. Stable simple bilateral renal cysts and upper pole right renal calculi.   Aortic Atherosclerosis (ICD10-I70.0) and Emphysema (ICD10-J43.9).     Electronically Signed   By: MYRTIS Stammer M.D.   On: 09/04/2023 10:17 PFT  OV 03/21/2024  Subjective:  Patient ID: [name] [name], male , DOB:  May 08, 1951 , age 77 y.o. , MRN: [id number] , ADDRESS: [address] [place] [facility] [id number] PCP [name], Jose DASEN, [name] Patient Care Team: [name], Jacob T, [name] as PCP - General (Physician Assistant) Cornelius Wanda DEL, MD as Consulting Physician (Hematology and Oncology)  This Provider for this visit: Treatment Team:  Attending Provider: Geronimo Amel, MD    03/21/2024 -   Chief Complaint  Patient presents with   Follow-up    Breathing is overall stable. He coughs up some light grey sputum in the am.      HPI [name] 72 y.o. -returns for routine follow-up.  Since his last visit he had a CT scan of the chest that a new 4 mm nodules.  Has upcoming follow-up with Dr. Sherrod.  From a respiratory standpoint he is stable without any flareup.  His symptoms show COPD CAT score of 8.  Nevertheless he is interested in evaluating Dupixent is a therapy for him.  He is open to getting CBC with differential.  Of note he is also on lisinopril  which could cause chronic cough.  He does not much complains about chronic cough.      03/21/2024    1:16 PM  CAT Score  Total CAT Score 8    OV 05/30/2024  Subjective:  Patient ID: [name] [name], male , DOB: [birth year] , age 33 y.o. , MRN: [id number] , ADDRESS: [address] [place] [id number] PCP [name], Jose DASEN, [name] Patient Care Team: [name], [name] RIGGERS as PCP - General (Physician Assistant) Cornelius Wanda DEL, MD as Consulting Physician (Hematology and Oncology)  This Provider for this visit: Treatment Team:  Attending Provider: Geronimo Amel, MD    05/30/2024 -   Chief Complaint  Patient presents with   Follow-up    Hospitalized 04/17/24 - 04/21/24 for pneumonia Pt states he is unable to stay off his o2 for long because his sats keep dropping. Pt states antibiotics from  PCP have helped.      HPI [name] 73 y.o. -returns for follow-up.  Presents with his wife who is independent historian today.  After I saw him early July 2024 he got admitted mid July 2025 with Acinetobacter pneumonia and then rehospitalized again because of some Lasix  related issues.  He had acute hypoxemic respiratory failure.  He had significant consolidation in his left lower lobe.  A person visualized the CT.  At this point in time he is improved.  He had a CT chest May 24, 2024  and my personal visualization the consolidation in the lower lobe is resolved but the official report is pending.  He says he is much better but he is having exertional hypoxemia.  He says he easily desaturates even walking 5 feet but the wife is concerned that the pulse oximeter he is using is defective.  We walked him today on room air to 100 feet with a Masimo FDA-approved pulse oximeter and he did desaturate 86% at 200 feet.  But otherwise he held up his oxygen .  Otherwise he is doing well he continues Trelegy.  He is was interested in biologic but his July eosinophils were normal.  Will recheck that today.  I talked him about the nebulizer OTHUVAYRE      Lab review at hospital shows anemia - hgb 10gm%   CT Chest data from date: *05/24/24  - personally visualized and independently interpreted : YES - my findings are: resolved pneumonia (official report pending)    OV  07/28/2024  Subjective:  Patient ID: [name] [name], male , DOB: 1950-09-25 , age 25 y.o. , MRN: [id number] , ADDRESS: [address] [place] [id number] PCP [name], Jose DASEN, [name] Patient Care Team: [name], [name] RIGGERS as PCP - General (Physician Assistant) Cornelius Wanda DEL, MD as Consulting Physician (Hematology and Oncology)  This Provider for this visit: Treatment Team:  Attending Provider: Geronimo Amel, MD    07/28/2024 -   Chief Complaint  Patient presents with   COPD    Pt states since LOV breathing hasn't been that great SOB w/ any activity pt does 3L of 02 of PCC     HPI [name] 73 y.o. -[name] is a 73 year old male with chronic obstructive pulmonary disease who presents with shortness of breath and cough.  He has experienced several episodes of shortness of breath over the past two months, occurring a couple of times a month. These episodes sometimes happen while sitting still and are exacerbated by overexertion. He has been on continuous oxygen  since his last  visit.  He describes a persistent cough with production of white to light gray phlegm, more than a tablespoon daily, which has remained consistent over the past year. He notes difficulty in expectorating the phlegm, attributing this to a previous medication, hydrochlorothiazide , that dried him out. Despite discontinuing the medication, he continues to have trouble clearing the phlegm.  He is currently using Mucinex  and saline nasal spray.  He is also using Breztri .  No fever, chills, nausea, vomiting, or diarrhea. He reports sleeping well and has received his flu shot two weeks ago.   His other concern is that the new nebulizer OTHUVAYRE has not been started.  He says that I did not do a prescription.  In review of the records last pharmacy note is that he was supposed to apply for financial aid.  He is unaware of this.  I have contacted our new pharmacist Aleck Puls.  She will call him today.  :abs last visit - epos still normal - so does not qualify dupioxent/nucala  Simple office walk 224 (66+46 x 2) feet Pod A at [facility] x  3 laps goal with forehead probe 07/12/2023    O2 used ra   Number laps completed Just 1   Comments about pace normal   Resting Pulse Ox/HR 96% and x/min   Final Pulse Ox/HR 94% and x/min   Desaturated </= 88% no   Desaturated <= 3% points no   Got Tachycardic >/= 90/min nx   Symptoms at end of test no   Miscellaneous comments no     CT Chest data from date: 93@5   - personally visualized and independently interpreted : no - my findings are: per eprot   Musculoskeletal: No aggressive lytic or blastic lesion of bone.   IMPRESSION: 1. Similar posttreatment change in the right lung apex. No discrete evidence of local recurrent disease. 2. Stable scattered pulmonary nodules. No new suspicious pulmonary nodules or masses. 3. Near complete resolution of the airspace opacities in the right middle lobe and left-greater-than-right lower lobes now with  some remnant ill-defined ground-glass and nodularity in the left lower lobe. 4. Stable mediastinal and right hilar lymph nodes. 5. Stable nonspecific heterogeneous splenic lesion measuring 3 cm. 6. Gas fluid levels in the esophagus, suggestive of gastroesophageal reflux. 7. Aortic atherosclerosis.   Aortic Atherosclerosis (ICD10-I70.0).     Electronically Signed   By: Reyes Holder M.D.   On:  06/02/2024 07:40    Latest Reference Range & Units 05/01/08 06:10 05/29/08 10:51 06/26/08 15:04 07/24/08 13:48 02/26/09 08:43 09/26/10 09:13 10/19/10 15:54 10/20/10 07:05 11/23/11 12:44 09/30/15 07:20 09/30/15 12:23 10/08/15 13:59 10/15/15 11:52 10/22/15 08:38 10/29/15 11:04 11/05/15 11:14 11/12/15 08:05 11/19/15 10:51 11/26/15 11:14 12/03/15 08:31 12/10/15 08:38 12/17/15 08:33 12/24/15 08:13 12/31/15 08:46 01/07/16 09:04 01/14/16 08:44 01/14/16 10:29 01/29/16 09:46 02/05/16 09:06 02/12/16 09:06 02/19/16 09:09 02/27/16 09:18 05/28/16 09:20 08/27/16 08:53 03/02/17 07:40 09/07/17 13:22 09/30/17 04:21 10/01/17 04:28 10/02/17 05:00 03/10/18 08:26 09/06/18 08:25 03/09/19 09:59 09/07/19 08:12 10/03/19 12:36 02/12/20 10:02 06/14/20 09:25 11/07/20 14:50 12/13/20 10:26 03/10/21 12:55 09/08/21 07:35 07/07/22 14:44 07/10/22 16:47 02/11/23 10:42 02/12/23 09:40 05/06/23 16:03 05/26/23 21:21 06/22/23 10:26 08/25/23 15:20 02/11/24 14:54 03/21/24 13:51 03/30/24 21:57 05/30/24 11:33 06/15/24 09:22  Eosinophils Absolute 0.0 - 0.5 K/uL 0.0 0.5 0.2 0.2 0.2 0.1 0.0 0.1 0.3 0.2 0.2 0.3 0.1 0.1 0.1 0.1 0.2 0.1 0.1 0.1 0.1 0.1 0.0 0.1 0.1 0.1 0.0 0.4 0.5 0.3 0.1 0.1 0.3 0.2 0.2 0.3 0.0 0.2 0.2 0.3 0.1 0.2 0.1 0.3 0.1 0.2 0.0 0.4 0.2 0.2 0.3 0.3 0.1 0.1 0.0 0.2 0.1 0.0 0.1 0.1 0.0 0.1 0.0    PFT     Latest Ref Rng & Units 10/05/2023    9:39 AM 12/18/2015    9:44 AM  PFT Results  FVC-Pre L 3.02  2.75   FVC-Predicted Pre % 64  55   FVC-Post L  2.99   FVC-Predicted Post %  60   Pre FEV1/FVC % % 57  56   Post FEV1/FCV % %  56    FEV1-Pre L 1.74  1.53   FEV1-Predicted Pre % 51  41   FEV1-Post L  1.66   DLCO uncorrected ml/min/mmHg 15.41  11.85   DLCO UNC% % 56  33   DLCO corrected ml/min/mmHg 17.64  13.96   DLCO COR %Predicted % 65  39   DLVA Predicted % 88  65   TLC L  6.25   TLC % Predicted %  84   RV % Predicted %  143        LAB RESULTS last 96 hours No results found.       has a past medical history of Abnormal finding on GI tract imaging (09/07/2023), Acute diastolic CHF (congestive heart failure) (HCC) (05/07/2023), Acute on chronic diastolic CHF (congestive heart failure) (HCC) (05/06/2023), Acute on chronic respiratory failure with hypoxia (HCC) (05/06/2023), Aortic atherosclerosis (01/26/2023), Arthritis, Atrial fibrillation (HCC) (10/26/2011), Benign neoplasm of colon (09/07/2023), BPH (benign prostatic hyperplasia) (2011), BRONCHOPNEUMONIA ORGANISM UNSPECIFIED (06/12/2010), Carpal tunnel syndrome, right upper limb (11/03/2022), Chronic respiratory failure with hypoxia (HCC) (08/27/2015), Congestive heart failure (HCC), COPD (chronic obstructive pulmonary disease) (HCC), COPD with acute exacerbation (HCC) (05/26/2023), Coronary artery calcification (01/26/2023), Cubital tunnel syndrome on right (07/09/2023), Depression, DOE (dyspnea on exertion) (04/07/2023), Essential hypertension (06/05/2008), GERD (gastroesophageal reflux disease), Hypothyroidism (03/30/2012), Lesion of ulnar nerve, right upper limb (11/03/2022), Lymphadenopathy syndrome (09/12/2015), Mediastinal adenopathy (09/12/2015), NHL (non-Hodgkin's lymphoma) (HCC) (09/25/2015), Numbness of right hand (06/28/2022), Obesity, Open wound of right hand without foreign body (07/27/2021), Other dysphagia (11/11/2010), Polycythemia (01/26/2023), Port catheter in place (08/20/2016), Pulmonary hypertension (HCC), Pure hypercholesterolemia (12/03/2011), Recurrent pneumonia (02/10/2012), Routine general medical examination at a health care facility  (12/03/2011), Squamous cell carcinoma lung (HCC) (09/26/2010), Status post bronchoscopy with biopsy, Stiffness of finger joint of right hand (09/24/2021), and Testicular swelling (09/12/2015).   reports that he quit smoking about 22 years ago. His  smoking use included cigarettes. He started smoking about 65 years ago. He has a 129 pack-year smoking history. He has been exposed to tobacco smoke. He has never used smokeless tobacco.  Past Surgical History:  Procedure Laterality Date   APPENDECTOMY     BACK SURGERY     CARDIOVERSION  11/27/2011   Procedure: CARDIOVERSION;  Surgeon: Aleene JINNY Passe, MD;  Location[facility] ENDOSCOPY;  Service: Cardiovascular;  Laterality: N/A;   COLONOSCOPY     COLONOSCOPY WITH PROPOFOL  N/A 09/07/2023   Procedure: COLONOSCOPY WITH PROPOFOL ;  Surgeon: Leigh Elspeth SQUIBB, MD;  Location: [facility] ENDOSCOPY;  Service: Gastroenterology;  Laterality: N/A;   FUDUCIAL PLACEMENT Right 10/13/2019   Procedure: Placement Of Fuducial;  Surgeon: Shelah Lamar RAMAN, MD;  Location: [facility] OR;  Service: Thoracic;  Laterality: Right;  Right Upper Lobe    LEFT HEART CATH AND CORONARY ANGIOGRAPHY N/A 04/12/2023   Procedure: LEFT HEART CATH AND CORONARY ANGIOGRAPHY;  Surgeon: Anner Alm ORN, MD;  Location[facility] INVASIVE CV LAB;  Service: Cardiovascular;  Laterality: N/A;   LUNG CANCER SURGERY  2011   POLYPECTOMY  09/07/2023   Procedure: POLYPECTOMY;  Surgeon: Leigh Elspeth SQUIBB, MD;  Location: [facility] ENDOSCOPY;  Service: Gastroenterology;;   UPPER GI ENDOSCOPY     VIDEO BRONCHOSCOPY WITH ENDOBRONCHIAL NAVIGATION N/A 10/13/2019   Procedure: VIDEO BRONCHOSCOPY WITH ENDOBRONCHIAL NAVIGATION;  Surgeon: Shelah Lamar RAMAN, MD;  Location: [facility] OR;  Service: Thoracic;  Laterality: N/A;   VIDEO BRONCHOSCOPY WITH ENDOBRONCHIAL ULTRASOUND N/A 10/13/2019   Procedure: VIDEO BRONCHOSCOPY WITH ENDOBRONCHIAL ULTRASOUND;  Surgeon: Shelah Lamar RAMAN, MD;  Location: [facility] OR;  Service: Thoracic;  Laterality: N/A;    Allergies   Allergen Reactions   Hydrochlorothiazide  Other (See Comments)    Dried out the patient too much   Lipitor [Atorvastatin ] Other (See Comments)    Myalgias  Weakness  Fatigue    Omnipaque  [Iohexol ] Itching and Other (See Comments)    When asked about contrast media today, pt stated that last time he had the contrast that he was red all over and itchy from head to toe.    Tape Other (See Comments)    Band-Aids irritate the skin    Immunization History  Administered Date(s) Administered   Fluad Quad(high Dose 65+) 07/07/2024   Fluad Trivalent(High Dose 65+) 05/28/2023   INFLUENZA, HIGH DOSE SEASONAL PF 06/10/2017, 07/27/2018, 06/22/2019   Influenza Whole 06/21/2009, 08/30/2011   Influenza-Unspecified 09/16/2015, 05/13/2016   PNEUMOCOCCAL CONJUGATE-20 03/03/2021   Pneumococcal Conjugate-13 08/06/2017   Pneumococcal Polysaccharide-23 05/22/2008   Tdap 12/03/2011, 12/23/2021    Family History  Problem Relation Age of Onset   Heart disease Father    Cancer Neg Hx    Diabetes Neg Hx    Stroke Neg Hx    Hypertension Neg Hx    Kidney disease Neg Hx      Current Outpatient Medications:    albuterol  (PROVENTIL ) (2.5 MG/3ML) 0.083% nebulizer solution, Take 3 mLs (2.5 mg total) by nebulization every 6 (six) hours as needed for wheezing or shortness of breath., Disp: 1080 mL, Rfl: 3   albuterol  (VENTOLIN  HFA) 108 (90 Base) MCG/ACT inhaler, Inhale 2 puffs into the lungs every 6 (six) hours as needed for wheezing or shortness of breath., Disp: , Rfl:    allopurinol  (ZYLOPRIM ) 300 MG tablet, Take 300 mg by mouth daily., Disp: , Rfl:    amLODipine  (NORVASC ) 5 MG tablet, Take 1 tablet (5 mg total) by mouth daily., Disp: 90 tablet, Rfl: 3   apixaban  (  ELIQUIS ) 5 MG TABS tablet, Take 1 tablet by mouth twice daily, Disp: 180 tablet, Rfl: 1   azelastine (ASTELIN) 0.1 % nasal spray, Place 2 sprays into both nostrils 2 (two) times daily. Use in each nostril as directed, Disp: 30 mL, Rfl: 12    budesonide -glycopyrrolate -formoterol  (BREZTRI ) 160-9-4.8 MCG/ACT AERO inhaler, Inhale 2 puffs into the lungs 2 (two) times daily., Disp: 10.7 g, Rfl: 0   Buprenorphine HCl-Naloxone HCl 8-2 MG FILM, Place 0.33-1 Film under the tongue 2 (two) times daily as needed (back pain). (Patient taking differently: Place 0.33-1 Film under the tongue as needed (back pain). Weekly as needed), Disp: , Rfl:    ezetimibe  (ZETIA ) 10 MG tablet, Take 10 mg by mouth daily., Disp: , Rfl:    Ferrous Sulfate  (IRON ) 325 (65 Fe) MG TABS, Take 1 tablet (325 mg total) by mouth daily., Disp: 30 tablet, Rfl: 0   finasteride  (PROSCAR ) 5 MG tablet, Take 5 mg by mouth daily., Disp: , Rfl:    furosemide  (LASIX ) 20 MG tablet, Take 1 tablet by mouth once daily, Disp: 30 tablet, Rfl: 11   levothyroxine  (SYNTHROID ) 175 MCG tablet, Take 175 mcg by mouth daily before breakfast., Disp: , Rfl:    LINZESS  290 MCG CAPS capsule, Take 1 capsule (290 mcg total) by mouth daily before breakfast., Disp: 30 capsule, Rfl: 5   Magnesium  Chloride 64 MG TABS, Take 64 mg by mouth daily in the afternoon., Disp: 90 tablet, Rfl: 3   metoprolol  succinate (TOPROL -XL) 25 MG 24 hr tablet, Take 1 tablet by mouth once daily, Disp: 90 tablet, Rfl: 1   omeprazole  (PRILOSEC) 20 MG capsule, Take 1 capsule (20 mg total) by mouth 2 (two) times daily., Disp: , Rfl:    OXYGEN , Inhale 2 L/min into the lungs See admin instructions. Inhale 2 L/min into the lungs at bedtime and as needed for shortness of breath during the daytime (Patient taking differently: Inhale 2-3 L/min into the lungs See admin instructions. Inhale 2 -3 L/min into the lungs at bedtime and as needed for shortness of breath during the daytime), Disp: , Rfl:    sodium chloride  (OCEAN) 0.65 % SOLN nasal spray, Place 1 spray into both nostrils as needed for congestion., Disp: , Rfl:    sodium chloride  HYPERTONIC 3 % nebulizer solution, Take by nebulization 2 (two) times daily. 3 mL 3% saline nebulizer twice  daily, Disp: 180 mL, Rfl: 4   zolpidem  (AMBIEN ) 10 MG tablet, Take 1 tablet (10 mg total) by mouth at bedtime as needed for sleep., Disp: 30 tablet, Rfl: 0   amoxicillin -clavulanate (AUGMENTIN ) 875-125 MG tablet, Take 1 tablet by mouth 2 (two) times daily. (Patient not taking: Reported on 07/28/2024), Disp: , Rfl:    betamethasone , augmented, (DIPROLENE ) 0.05 % lotion, Apply 1 Application topically daily as needed (dry skin on scalp). (Patient not taking: Reported on 07/28/2024), Disp: , Rfl:    ciclopirox  (PENLAC ) 8 % solution, Apply topically at bedtime. Apply a thin coat over nail. Apply daily over previous coat. Remove weekly with nail polish remover. (Patient not taking: Reported on 07/28/2024), Disp: 6.6 mL, Rfl: 11      Objective:   Vitals:   07/28/24 0959  BP: 112/62  Pulse: 64  Temp: 98.1 F (36.7 C)  TempSrc: Oral  SpO2: 95%  Weight: 216 lb (98 kg)  Height: 6' (1.829 m)    Estimated body mass index is 29.29 kg/m as calculated from the following:   Height as of this encounter: 6' (  1.829 m).   Weight as of this encounter: 216 lb (98 kg).  @WEIGHTCHANGE @  Filed Weights   07/28/24 0959  Weight: 216 lb (98 kg)     Physical Exam   General: No distress. Looks wwel O2 at rest: no Cane present: no Sitting in wheel chair: no Frail: no Obese: no Neuro: [name] and Oriented x 3. GCS 15. Speech normal Psych: Pleasant Resp:  Barrel Chest - no.  Wheeze - no, Crackles - no, No overt respiratory distress CVS: Normal heart sounds. Murmurs - no Ext: Stigmata of Connective Tissue Disease - no HEENT: Normal upper airway. PEERL +. No post nasal drip        Assessment/     Assessment & Plan COPD, severe (HCC)  Mixed simple and mucopurulent chronic bronchitis (HCC)  DOE (dyspnea on exertion)  Exercise hypoxemia    PLAN Patient Instructions     ICD-10-CM   1. COPD, severe (HCC)  J44.9     2. COPD, frequent exacerbations (HCC)  J44.1     3. Hospital discharge  follow-up  Z09     4. History of bacterial pneumonia  Z87.01       Hospital discharge follow-up History of bacterial pneumonia - acinetobacter mid July 2025 History anemia in hospital  - CT scna 05/24/24 results show  pneumonia has healed  plan -expectant followup  COPD, severe (HCC) COPD, frequent exacerbations (HCC)  - Currently stable but having lot of sputum daily despite breztri  and mucinex   - did not qualify for Nucala or Dupixent in July 2025 and 07/28/2024 due to normal eos  - not sure what the problem with getting Otuvayre is  Plan  - continue BREZTRI  - AWAIT START OTHUVAYRE  - our pharmacist Aleck will call you today monte is not able to meet you right now] - - continue night o2   Chronic mucopurulent bronchitis  Plan  - Start Astelin nasal spray - Start 3 mL 3% saline nebulizer twice daily  - If your pharmacy is not able to dispense this please to call us   Exercise hypoxemia   - your home monitor might be defective - this is improved but desaturaing at 86% at 200 feet  Plan - continue portable o2 for exertional pulse ox < 88% which iis > 200 feet - BUT use a better pulse oximeter   GERD  - CT chest shows active GERD  Plan  - pleasetalk to your GI doctor   Followup   -4 months or sooner if needed - visit APP    FOLLOWUP    Return for  4 months or sooner if needed -please make this appointment with nurse practitioner.    SIGNATURE    Dr. Dorethia Cave, M.D., F.C.C.P,  Pulmonary and Critical Care Medicine Staff Physician, [facility] Director - Interstitial Lung Disease  Program  Pulmonary Fibrosis [facility] at [facility] [place], [place], [address]  Pager: [phone], If no answer or between  15:00h - 7:00h: call [phone] Telephone: [phone]  10:22 AM 07/28/2024

## 2024-07-28 NOTE — Patient Instructions (Addendum)
 ICD-10-CM   1. COPD, severe (HCC)  J44.9     2. COPD, frequent exacerbations (HCC)  J44.1     3. Hospital discharge follow-up  Z09     4. History of bacterial pneumonia  Z87.01       Hospital discharge follow-up History of bacterial pneumonia - acinetobacter mid July 2025 History anemia in hospital  - CT scna 05/24/24 results show  pneumonia has healed  plan -expectant followup  COPD, severe (HCC) COPD, frequent exacerbations (HCC)  - Currently stable but having lot of sputum daily despite breztri  and mucinex   - did not qualify for Nucala or Dupixent in July 2025 and 07/28/2024 due to normal eos  - not sure what the problem with getting Otuvayre is  Plan  - continue BREZTRI  - AWAIT START OTHUVAYRE  - our pharmacist Aleck will call you today monte is not able to meet you right now] - - continue night o2   Chronic mucopurulent bronchitis  Plan  - Start Astelin nasal spray - Start 3 mL 3% saline nebulizer twice daily  - If your pharmacy is not able to dispense this please to call us   Exercise hypoxemia   - your home monitor might be defective - this is improved but desaturaing at 86% at 200 feet  Plan - continue portable o2 for exertional pulse ox < 88% which iis > 200 feet - BUT use a better pulse oximeter   GERD  - CT chest shows active GERD  Plan  - pleasetalk to your GI doctor   Followup   -4 months or sooner if needed - visit APP

## 2024-07-28 NOTE — Telephone Encounter (Signed)
 Received message from Dr. Geronimo that patient was unable to start Ohtuvyare. Contacted [facility] - they report there was an error and the pharmacy did not receive the Rx. [name] is resending the packet to [facility] today.   Called patient to alert of above. Provided pharmacy contact # - advised to call [facility] if he does not hear from them by Tuesday next week. If the copay is unaffordable, he should be referred to [facility] for patient assistance program pre-screening. Provided [facility] # for patient to contact in case the copay is unaffordable.   Patient verbalizes understanding and agreement with plan.

## 2024-07-29 NOTE — Progress Notes (Unsigned)
 [name] - 73 y.o. male MRN [id number]  Date of birth: [birth year]  Office Visit Note: Visit Date: 07/31/2024 PCP: Iven Lang DASEN, PA-C Referred by: Iven Lang DASEN, PA-C  Subjective: No chief complaint on file.  HPI: [name] is a pleasant 73 y.o. male who returns today for ongoing left wrist pain that is chronic in nature, as previously known, has a remote history of a prior left wrist or basilar thumb fracture he believes that was treated nonoperatively years prior.  This may have been a prior scaphoid injury based on his radiographic workup.  Continues to localize pain to the wrist region.  Underwent cortisone injection approximately 3 months prior with notable relief of symptoms.  He has a complex medical history of prior lung cancer status post lobectomy as well as prior non-Hodgkin's lymphoma which currently in remission after chemotherapy.  He takes Eliquis  at baseline for atrial fibrillation.   Pertinent ROS were reviewed with the patient and found to be negative unless otherwise specified above in HPI.       Assessment & Plan: Visit Diagnoses:  No diagnosis found.   Plan: Extensive discussion was once again had with the patient today regarding his left wrist arthritis.  He has significant radiocarpal arthritis particularly at the radial scaphoid interface.  Despite significant radiographic findings, he has maintained fairly good range of motion at the wrist region, may be having some motion through the midcarpal joints as well.  Given his ongoing pain, we did discuss appropriate treatment modalities for ongoing radiocarpal arthritis including conservative versus surgical treatments.  From conservative standpoint, we discussed activity modification, bracing, cortisone injections, anti-inflammatory medication both topical and oral.  From a surgical standpoint, I mentioned to him that he would be an appropriate candidate for potential salvage procedure in the future  either scaphoidectomy and 4 corner fusion versus potential proximal row carpectomy with possible allograft spacer.  His capitate on radiographs appears to be fairly well-preserved.  For the time being, given his notable relief with cortisone injection at the radiocarpal region, he would like to continue with conservative measures.  He will return to me as needed in the future for repeat discussion should symptoms recur.   Follow-up: No follow-ups on file.   Meds & Orders: No orders of the defined types were placed in this encounter.   No orders of the defined types were placed in this encounter.    Procedures: No procedures performed      Clinical History: No specialty comments available.  He reports that he quit smoking about 22 years ago. His smoking use included cigarettes. He started smoking about 65 years ago. He has a 129 pack-year smoking history. He has been exposed to tobacco smoke. He has never used smokeless tobacco.  Recent Labs    03/01/24 0818  HGBA1C 5.6    Objective:   Vital Signs: There were no vitals taken for this visit.  Physical Exam  Gen: Well-appearing, in no acute distress; non-toxic CV: Regular Rate. Well-perfused. Warm.  Resp: Breathing unlabored on room air; no wheezing. Psych: Fluid speech in conversation; appropriate affect; normal thought process  Ortho Exam Left wrist: - Notable tenderness over the radiocarpal region particular at the scapholunate interval and over the radial scaphoid interface - Range of motion of the left wrist flexion/extension 35/15, compared to contralateral side 65/55 - Able to perform composite fist without significant restriction, pronation/supination is intact 70/70 without notable instability - Grip strength Jamar 2 right 60, left  40 with moderate pain  Imaging: No results found. Prior images of the left wrist were reviewed which show significant radial scaphoid osteoarthritis as well as STT and thumb CMC  arthritis  Past Medical/Family/Surgical/Social History: Medications & Allergies reviewed per EMR, new medications updated. Patient Active Problem List   Diagnosis Date Noted   COPD exacerbation (HCC) 04/17/2024   Senile purpura 04/05/2024   Hyperkalemia 03/31/2024   Multifocal pneumonia 03/31/2024   Sepsis due to pneumonia (HCC) 03/30/2024   Statin myopathy 02/21/2024   Chronic constipation 02/21/2024   Primary osteoarthritis of left distal radioulnar joint 02/21/2024   Primary osteoarthritis of right distal radioulnar joint 01/28/2024   Idiopathic chronic gout of multiple sites without tophus 01/24/2024   Cubital tunnel syndrome on right 07/09/2023   COPD with acute exacerbation (HCC) 05/26/2023   Chronic diastolic CHF (congestive heart failure) (HCC) 05/07/2023   Acute on chronic diastolic CHF (congestive heart failure) (HCC) 05/06/2023   Acute on chronic respiratory failure with hypoxia (HCC) 05/06/2023   DOE (dyspnea on exertion) 04/07/2023   Congestive heart failure (HCC) 01/26/2023   GERD (gastroesophageal reflux disease) 01/26/2023   Obesity 01/26/2023   Polycythemia 01/26/2023   Pulmonary hypertension (HCC) 01/26/2023   Aortic atherosclerosis 01/26/2023   Coronary artery calcification 01/26/2023   Carpal tunnel syndrome, right upper limb 11/03/2022   Lesion of ulnar nerve, right upper limb 11/03/2022   Numbness of right hand 06/28/2022   Status post bronchoscopy with biopsy    Port catheter in place 08/20/2016   NHL (non-Hodgkin's lymphoma) (HCC) 09/25/2015   Lymphadenopathy syndrome 09/12/2015   Mediastinal adenopathy 09/12/2015   Chronic respiratory failure with hypoxia (HCC) 08/27/2015   Hypothyroidism 03/30/2012   Depression 12/03/2011   Pure hypercholesterolemia 12/03/2011   Atrial fibrillation (HCC) 10/26/2011   Other dysphagia 11/11/2010   Squamous cell carcinoma lung (HCC) 09/26/2010   BPH (benign prostatic hyperplasia) 2011   ERECTILE DYSFUNCTION  09/24/2008   Essential hypertension 06/05/2008   COPD (chronic obstructive pulmonary disease) (HCC) 06/05/2008   Past Medical History:  Diagnosis Date   Abnormal finding on GI tract imaging 09/07/2023   Acute diastolic CHF (congestive heart failure) (HCC) 05/07/2023   Acute on chronic diastolic CHF (congestive heart failure) (HCC) 05/06/2023   Acute on chronic respiratory failure with hypoxia (HCC) 05/06/2023   Aortic atherosclerosis 01/26/2023   Arthritis    Atrial fibrillation (HCC) 10/26/2011   Benign neoplasm of colon 09/07/2023   BPH (benign prostatic hyperplasia) 2011   BRONCHOPNEUMONIA ORGANISM UNSPECIFIED 06/12/2010         Replacing diagnoses that were inactivated after the 12/21/22 regulatory import   Carpal tunnel syndrome, right upper limb 11/03/2022   Chronic respiratory failure with hypoxia (HCC) 08/27/2015   Congestive heart failure (HCC)    COPD (chronic obstructive pulmonary disease) (HCC)    COPD with acute exacerbation (HCC) 05/26/2023   Coronary artery calcification 01/26/2023   Cubital tunnel syndrome on right 07/09/2023   Depression    DOE (dyspnea on exertion) 04/07/2023   Essential hypertension 06/05/2008   Qualifier: Diagnosis of   By: [name]), [name]       GERD (gastroesophageal reflux disease)    Hypothyroidism 03/30/2012   Lesion of ulnar nerve, right upper limb 11/03/2022   Lymphadenopathy syndrome 09/12/2015   Mediastinal adenopathy 09/12/2015   NHL (non-Hodgkin's lymphoma) (HCC) 09/25/2015   Numbness of right hand 06/28/2022   Obesity    Open wound of right hand without foreign body 07/27/2021   Other dysphagia 11/11/2010  Qualifier: Diagnosis of   By: [name], [name]       Polycythemia 01/26/2023   Port catheter in place 08/20/2016   Pulmonary hypertension (HCC)    Pure hypercholesterolemia 12/03/2011   Recurrent pneumonia 02/10/2012   Routine general medical examination at a health care facility 12/03/2011   Squamous cell  carcinoma lung (HCC) 09/26/2010   S/p LUL resection Dr Brantley dec 2011      Status post bronchoscopy with biopsy    Stiffness of finger joint of right hand 09/24/2021   Testicular swelling 09/12/2015   Family History  Problem Relation Age of Onset   Heart disease Father    Cancer Neg Hx    Diabetes Neg Hx    Stroke Neg Hx    Hypertension Neg Hx    Kidney disease Neg Hx    Past Surgical History:  Procedure Laterality Date   APPENDECTOMY     BACK SURGERY     CARDIOVERSION  11/27/2011   Procedure: CARDIOVERSION;  Surgeon: Aleene JINNY Passe, MD;  Location: [facility] ENDOSCOPY;  Service: Cardiovascular;  Laterality: N/A;   COLONOSCOPY     COLONOSCOPY WITH PROPOFOL  N/A 09/07/2023   Procedure: COLONOSCOPY WITH PROPOFOL ;  Surgeon: Leigh Elspeth SQUIBB, MD;  Location: [facility] ENDOSCOPY;  Service: Gastroenterology;  Laterality: N/A;   FUDUCIAL PLACEMENT Right 10/13/2019   Procedure: Placement Of Fuducial;  Surgeon: Shelah Lamar RAMAN, MD;  Location: [facility] OR;  Service: Thoracic;  Laterality: Right;  Right Upper Lobe    LEFT HEART CATH AND CORONARY ANGIOGRAPHY N/A 04/12/2023   Procedure: LEFT HEART CATH AND CORONARY ANGIOGRAPHY;  Surgeon: Anner Alm ORN, MD;  Location: [facility] INVASIVE CV LAB;  Service: Cardiovascular;  Laterality: N/A;   LUNG CANCER SURGERY  2011   POLYPECTOMY  09/07/2023   Procedure: POLYPECTOMY;  Surgeon: Leigh Elspeth SQUIBB, MD;  Location: [facility] ENDOSCOPY;  Service: Gastroenterology;;   UPPER GI ENDOSCOPY     VIDEO BRONCHOSCOPY WITH ENDOBRONCHIAL NAVIGATION N/A 10/13/2019   Procedure: VIDEO BRONCHOSCOPY WITH ENDOBRONCHIAL NAVIGATION;  Surgeon: Shelah Lamar RAMAN, MD;  Location: [facility] OR;  Service: Thoracic;  Laterality: N/A;   VIDEO BRONCHOSCOPY WITH ENDOBRONCHIAL ULTRASOUND N/A 10/13/2019   Procedure: VIDEO BRONCHOSCOPY WITH ENDOBRONCHIAL ULTRASOUND;  Surgeon: Shelah Lamar RAMAN, MD;  Location: [facility] OR;  Service: Thoracic;  Laterality: N/A;   Social History   Occupational History   Occupation: truck driver    Occupation: truck driver  Tobacco Use   Smoking status: Former    Current packs/day: 0.00    Average packs/day: 3.0 packs/day for 43.0 years (129.0 ttl pk-yrs)    Types: Cigarettes    Start date: 10/22/1958    Quit date: 10/22/2001    Years since quitting: 22.7    Passive exposure: Current   Smokeless tobacco: Never  Vaping Use   Vaping status: Never Used  Substance and Sexual Activity   Alcohol  use: No   Drug use: No   Sexual activity: Yes    Trae Bovenzi Afton Alderton, M.D. [facility], Hand Surgery

## 2024-07-31 ENCOUNTER — Other Ambulatory Visit: Payer: Self-pay

## 2024-07-31 ENCOUNTER — Ambulatory Visit: Admitting: Orthopedic Surgery

## 2024-07-31 DIAGNOSIS — M19032 Primary osteoarthritis, left wrist: Secondary | ICD-10-CM

## 2024-08-02 ENCOUNTER — Ambulatory Visit (INDEPENDENT_AMBULATORY_CARE_PROVIDER_SITE_OTHER): Admitting: Student

## 2024-08-02 ENCOUNTER — Encounter (HOSPITAL_BASED_OUTPATIENT_CLINIC_OR_DEPARTMENT_OTHER): Payer: Self-pay | Admitting: Student

## 2024-08-02 VITALS — BP 144/64 | HR 70 | Temp 98.2°F | Resp 18 | Ht 72.0 in | Wt 215.4 lb

## 2024-08-02 DIAGNOSIS — J449 Chronic obstructive pulmonary disease, unspecified: Secondary | ICD-10-CM

## 2024-08-02 DIAGNOSIS — I5032 Chronic diastolic (congestive) heart failure: Secondary | ICD-10-CM

## 2024-08-02 DIAGNOSIS — D509 Iron deficiency anemia, unspecified: Secondary | ICD-10-CM

## 2024-08-02 DIAGNOSIS — J9611 Chronic respiratory failure with hypoxia: Secondary | ICD-10-CM

## 2024-08-02 DIAGNOSIS — I10 Essential (primary) hypertension: Secondary | ICD-10-CM | POA: Diagnosis not present

## 2024-08-02 DIAGNOSIS — C8332 Diffuse large B-cell lymphoma, intrathoracic lymph nodes: Secondary | ICD-10-CM | POA: Diagnosis not present

## 2024-08-02 DIAGNOSIS — I482 Chronic atrial fibrillation, unspecified: Secondary | ICD-10-CM

## 2024-08-02 DIAGNOSIS — E7800 Pure hypercholesterolemia, unspecified: Secondary | ICD-10-CM

## 2024-08-02 DIAGNOSIS — K5909 Other constipation: Secondary | ICD-10-CM

## 2024-08-02 DIAGNOSIS — E039 Hypothyroidism, unspecified: Secondary | ICD-10-CM

## 2024-08-02 MED ORDER — LEVOTHYROXINE SODIUM 175 MCG PO TABS
175.0000 ug | ORAL_TABLET | Freq: Every day | ORAL | 3 refills | Status: AC
Start: 2024-08-02 — End: ?

## 2024-08-02 MED ORDER — IRON 325 (65 FE) MG PO TABS: 1.0000 | ORAL_TABLET | Freq: Every day | ORAL | 6 refills | Status: AC

## 2024-08-02 MED ORDER — LINZESS 290 MCG PO CAPS
290.0000 ug | ORAL_CAPSULE | Freq: Every day | ORAL | 5 refills | Status: AC
Start: 2024-08-02 — End: ?

## 2024-08-02 NOTE — Patient Instructions (Signed)
 It was nice to see you today!  If you have any problems before your next visit feel free to message me via MyChart (minor issues or questions) or call the office, otherwise you may reach out to schedule an office visit.  Thank you! Pau Banh, PA-C

## 2024-08-02 NOTE — Progress Notes (Signed)
 Established Patient Office Visit  Subjective   Patient ID: [name], male    DOB: 1951-06-17  Age: 73 y.o. MRN: [id number]  Chief Complaint  Patient presents with   Medical Management of Chronic Issues    Follow up. Lung doctor put him on a sodium nebulizer solution but he has not started it due to concerned of retaining fluid. Would like advise.  BP at home was 132/55 today before coming here.    HPI  Discussed the use of AI scribe software for clinical note transcription with the patient, who gave verbal consent to proceed.  History of Present Illness   [name] is a 73 year old male with COPD and cardiovascular issues who presents for follow-up.  He has been experiencing fluid retention and has been in contact with his cardiologist but has not received a response. He reports frustration with the triage service and difficulty scheduling appointments.  Regarding his oxygen  use, he typically uses two liters but increases to three liters when needed. He feels fine when sitting still but experiences shortness of breath upon exertion. His oxygen  saturation can drop to 88% without feeling short of breath, and he has experienced levels as low as 74% with minimal distress.  His blood pressure readings at home have been stable, with the highest being 139 mmHg systolic. A recent reading at another doctor's office was 117/68 mmHg.  He requests refills for his iron , Lantus, and levothyroxine . He has a week's supply of levothyroxine  left and is awaiting thyroid  level results before refilling. His thyroid  levels were last checked a year ago.  He mentions being seen at the cancer center about five weeks ago and reports that everything is going well there.      Patient Active Problem List   Diagnosis Date Noted   COPD exacerbation (HCC) 04/17/2024   Senile purpura 04/05/2024   Hyperkalemia 03/31/2024   Multifocal pneumonia 03/31/2024   Sepsis due to pneumonia (HCC) 03/30/2024    Statin myopathy 02/21/2024   Chronic constipation 02/21/2024   Primary osteoarthritis of left distal radioulnar joint 02/21/2024   Primary osteoarthritis of right distal radioulnar joint 01/28/2024   Idiopathic chronic gout of multiple sites without tophus 01/24/2024   Cubital tunnel syndrome on right 07/09/2023   COPD with acute exacerbation (HCC) 05/26/2023   Chronic diastolic CHF (congestive heart failure) (HCC) 05/07/2023   Acute on chronic diastolic CHF (congestive heart failure) (HCC) 05/06/2023   Acute on chronic respiratory failure with hypoxia (HCC) 05/06/2023   DOE (dyspnea on exertion) 04/07/2023   Congestive heart failure (HCC) 01/26/2023   GERD (gastroesophageal reflux disease) 01/26/2023   Obesity 01/26/2023   Polycythemia 01/26/2023   Pulmonary hypertension (HCC) 01/26/2023   Aortic atherosclerosis 01/26/2023   Coronary artery calcification 01/26/2023   Carpal tunnel syndrome, right upper limb 11/03/2022   Lesion of ulnar nerve, right upper limb 11/03/2022   Numbness of right hand 06/28/2022   Status post bronchoscopy with biopsy    Port catheter in place 08/20/2016   NHL (non-Hodgkin's lymphoma) (HCC) 09/25/2015   Lymphadenopathy syndrome 09/12/2015   Mediastinal adenopathy 09/12/2015   Chronic respiratory failure with hypoxia (HCC) 08/27/2015   Hypothyroidism 03/30/2012   Depression 12/03/2011   Pure hypercholesterolemia 12/03/2011   Atrial fibrillation (HCC) 10/26/2011   Other dysphagia 11/11/2010   Squamous cell carcinoma lung (HCC) 09/26/2010   BPH (benign prostatic hyperplasia) 2011   ERECTILE DYSFUNCTION 09/24/2008   Essential hypertension 06/05/2008   COPD (chronic obstructive pulmonary disease) (  HCC) 06/05/2008   Past Medical History:  Diagnosis Date   Abnormal finding on GI tract imaging 09/07/2023   Acute diastolic CHF (congestive heart failure) (HCC) 05/07/2023   Acute on chronic diastolic CHF (congestive heart failure) (HCC) 05/06/2023   Acute on  chronic respiratory failure with hypoxia (HCC) 05/06/2023   Aortic atherosclerosis 01/26/2023   Arthritis    Atrial fibrillation (HCC) 10/26/2011   Benign neoplasm of colon 09/07/2023   BPH (benign prostatic hyperplasia) 2011   BRONCHOPNEUMONIA ORGANISM UNSPECIFIED 06/12/2010         Replacing diagnoses that were inactivated after the 12/21/22 regulatory import   Carpal tunnel syndrome, right upper limb 11/03/2022   Chronic respiratory failure with hypoxia (HCC) 08/27/2015   Congestive heart failure (HCC)    COPD (chronic obstructive pulmonary disease) (HCC)    COPD with acute exacerbation (HCC) 05/26/2023   Coronary artery calcification 01/26/2023   Cubital tunnel syndrome on right 07/09/2023   Depression    DOE (dyspnea on exertion) 04/07/2023   Essential hypertension 06/05/2008   Qualifier: Diagnosis of   By: [name]), [name]       GERD (gastroesophageal reflux disease)    Hypothyroidism 03/30/2012   Lesion of ulnar nerve, right upper limb 11/03/2022   Lymphadenopathy syndrome 09/12/2015   Mediastinal adenopathy 09/12/2015   NHL (non-Hodgkin's lymphoma) (HCC) 09/25/2015   Numbness of right hand 06/28/2022   Obesity    Open wound of right hand without foreign body 07/27/2021   Other dysphagia 11/11/2010   Qualifier: Diagnosis of   By: [name], [name]       Polycythemia 01/26/2023   Port catheter in place 08/20/2016   Pulmonary hypertension (HCC)    Pure hypercholesterolemia 12/03/2011   Recurrent pneumonia 02/10/2012   Routine general medical examination at a health care facility 12/03/2011   Squamous cell carcinoma lung (HCC) 09/26/2010   S/p LUL resection Dr Brantley dec 2011      Status post bronchoscopy with biopsy    Stiffness of finger joint of right hand 09/24/2021   Testicular swelling 09/12/2015   Social History   Tobacco Use   Smoking status: Former    Current packs/day: 0.00    Average packs/day: 3.0 packs/day for 43.0 years (129.0 ttl pk-yrs)     Types: Cigarettes    Start date: 10/22/1958    Quit date: 10/22/2001    Years since quitting: 22.7    Passive exposure: Current   Smokeless tobacco: Never  Vaping Use   Vaping status: Never Used  Substance Use Topics   Alcohol  use: No   Drug use: No   Allergies  Allergen Reactions   Hydrochlorothiazide  Other (See Comments)    Dried out the patient too much   Lipitor [Atorvastatin ] Other (See Comments)    Myalgias  Weakness  Fatigue    Omnipaque  [Iohexol ] Itching and Other (See Comments)    When asked about contrast media today, pt stated that last time he had the contrast that he was red all over and itchy from head to toe.    Tape Other (See Comments)    Band-Aids irritate the skin      ROS Per HPI.    Objective:     BP (!) 144/64   Pulse 70   Temp 98.2 F (36.8 C) (Oral)   Resp 18   Ht 6' (1.829 m)   Wt 215 lb 6.4 oz (97.7 kg)   SpO2 96%   PF (!) 2 L/min   BMI 29.21  kg/m  BP Readings from Last 3 Encounters:  08/02/24 (!) 144/64  07/28/24 112/62  06/22/24 120/68   Wt Readings from Last 3 Encounters:  08/02/24 215 lb 6.4 oz (97.7 kg)  07/28/24 216 lb (98 kg)  06/22/24 210 lb 11.2 oz (95.6 kg)   SpO2 Readings from Last 3 Encounters:  08/02/24 96%  07/28/24 95%  05/30/24 (!) 73%      Physical Exam Constitutional:      General: He is not in acute distress.    Appearance: Normal appearance. He is not ill-appearing.  HENT:     Head: Normocephalic and atraumatic.     Right Ear: External ear normal.     Left Ear: External ear normal.     Nose: Nose normal.  Eyes:     Conjunctiva/sclera: Conjunctivae normal.  Cardiovascular:     Rate and Rhythm: Normal rate.     Pulses: Normal pulses.     Heart sounds: Normal heart sounds. No murmur heard.    No friction rub.  Pulmonary:     Effort: Pulmonary effort is normal. No respiratory distress.     Breath sounds: Normal breath sounds. No wheezing, rhonchi or rales.     Comments: No thoracic muscle  use Skin:    General: Skin is warm and dry.     Coloration: Skin is not jaundiced or pale.  Neurological:     Mental Status: He is alert.  Psychiatric:        Mood and Affect: Mood normal.        Behavior: Behavior normal.      No results found for any visits on 08/02/24.  Last CBC Lab Results  Component Value Date   WBC 6.5 06/15/2024   HGB 10.3 (L) 06/15/2024   HCT 33.2 (L) 06/15/2024   MCV 89.7 06/15/2024   MCH 27.8 06/15/2024   RDW 14.9 06/15/2024   PLT 158 06/15/2024   Last metabolic panel Lab Results  Component Value Date   GLUCOSE 169 (H) 06/15/2024   NA 142 06/15/2024   K 4.4 06/15/2024   CL 105 06/15/2024   CO2 34 (H) 06/15/2024   BUN 24 (H) 06/15/2024   CREATININE 1.01 06/15/2024   GFRNONAA >60 06/15/2024   CALCIUM  8.9 06/15/2024   PHOS 2.7 04/19/2024   PROT 6.4 (L) 06/15/2024   ALBUMIN 4.0 06/15/2024   LABGLOB 2.1 04/07/2023   BILITOT 0.6 06/15/2024   ALKPHOS 98 06/15/2024   AST 20 06/15/2024   ALT 17 06/15/2024   ANIONGAP 3 (L) 06/15/2024   Last lipids Lab Results  Component Value Date   CHOL 156 03/01/2024   HDL 43 03/01/2024   LDLCALC 103 (H) 03/01/2024   LDLDIRECT 99 05/18/2023   TRIG 48 03/01/2024   CHOLHDL 3.6 03/01/2024   Last hemoglobin A1c Lab Results  Component Value Date   HGBA1C 5.6 03/01/2024      The 10-year ASCVD risk score (Arnett DK, et al., 2019) is: 29.1%    Assessment & Plan:   Assessment and Plan    Chronic obstructive pulmonary disease on home oxygen - Chronic respiratory failure with hypoxia COPD managed with home oxygen  therapy. Oxygen  saturation typically ranges from 88% to 92% at rest, with no significant dyspnea. Oxygen  requirement increases to 3 liters during exertion. Nebulized saline recommended to help loosen secretions, with reassurance that fluid retention is not a significant concern. - Continue home oxygen  therapy at 2 liters at rest, increase to 3 liters during exertion. - Use nebulized saline  to  help loosen secretions.  Hypertension/Chronic diastolic heart failure Chronic stable- BP is fine at home, not appropriate to lower BP at this time. Blood pressure well-controlled with home readings not exceeding 139/80 mmHg. Euvolemic on exam.  - Continue current management and monitor blood pressure regularly. - Continue to follow with cardiology.  Chronic atrial fibrillation Stable, continues to follow with cardiology.  - appears stable today. - Continue to follow with cardiology  Hypothyroidism Chronic, stable. Managed with levothyroxine . Last thyroid  function test was a year ago. Current prescription is 90 mcg, but adjustment may be needed based on upcoming thyroid  panel results. - Ordered thyroid  function test. - Hold levothyroxine  refill until thyroid  panel results are reviewed.  Iron  deficiency anemia Stable on iron . Requires iron  supplementation. - Refilled iron  prescription.  Pure hypercholesterolemia Chronic, stable. Last cholesterol panel was five months ago. - Ordered cholesterol panel.  Diffuse large B-cell lymphoma, intrathoracic lymph nodes Under care of cancer center with recent follow-up approximately five weeks ago. - Continue follow-up with cancer center.   Chronic constipation - Refill linzess      Return in about 3 months (around 11/02/2024) for 3 month follow up with Dr. Dottie for labs and check in.    Jose DASEN Gerhard Rappaport, PA-C

## 2024-08-03 ENCOUNTER — Other Ambulatory Visit (HOSPITAL_COMMUNITY): Payer: Self-pay

## 2024-08-03 ENCOUNTER — Telehealth: Payer: Self-pay

## 2024-08-03 ENCOUNTER — Encounter: Payer: Self-pay | Admitting: Internal Medicine

## 2024-08-03 LAB — LIPID PANEL
Chol/HDL Ratio: 3.3 ratio (ref 0.0–5.0)
Cholesterol, Total: 162 mg/dL (ref 100–199)
HDL: 49 mg/dL (ref >39)
LDL Chol Calc (NIH): 104 mg/dL — ABNORMAL HIGH (ref 0–99)
Triglycerides: 39 mg/dL (ref 0–149)
VLDL Cholesterol Cal: 9 mg/dL (ref 5–40)

## 2024-08-03 LAB — TSH+FREE T4
Free T4: 1.68 ng/dL (ref 0.82–1.77)
TSH: 0.387 u[IU]/mL — ABNORMAL LOW (ref 0.450–4.500)

## 2024-08-03 NOTE — Telephone Encounter (Unsigned)
 Copied from CRM #[id number]. Topic: Clinical - Medication Prior Auth >> Aug 03, 2024  1:30 PM Whitney O wrote: Reason for CRM: patty pharmacy technician with [facility]  just calling about a prior authorization thats needed for ohtuvayre  sent the information over this morning wanted to know was it received / just wanted to make sure you were aware of the needed authorization for medication  CoverMyMed Key code [id number]  [id number] pharmacy number

## 2024-08-03 NOTE — Telephone Encounter (Signed)
 We received this fax. Just an FYI if there is a call for a specialty med the correct pool is RX RHEUM/PULM. Non-specialty meds should be routed to RX PRIOR AUTH TEAM.

## 2024-08-03 NOTE — Telephone Encounter (Signed)
 Received fax from [facility] stating a pa is needed for Ohtuvayre . Submitted a Prior Authorization request to [employer] for OHTUVAYRE  via CoverMyMeds. Will update once we receive a response.  Key: [id number]

## 2024-08-03 NOTE — Telephone Encounter (Signed)
 Copied from CRM #[id number]. Topic: Clinical - Medication Prior Auth >> Aug 03, 2024  1:30 PM Whitney O wrote: Reason for CRM: patty pharmacy technician with [facility]  just calling about a prior authorization thats needed for ohtuvayre  sent the information over this morning wanted to know was it received / just wanted to make sure you were aware of the needed authorization for medication  CoverMyMed Key code [id number]  [id number] pharmacy number

## 2024-08-04 ENCOUNTER — Other Ambulatory Visit (HOSPITAL_COMMUNITY): Payer: Self-pay

## 2024-08-04 ENCOUNTER — Ambulatory Visit (HOSPITAL_BASED_OUTPATIENT_CLINIC_OR_DEPARTMENT_OTHER): Payer: Self-pay | Admitting: Student

## 2024-08-04 NOTE — Telephone Encounter (Signed)
 Received notification from [employer] regarding a prior authorization for OHTUVAYRE . Authorization has been APPROVED from 08/03/24 to 09/20/25. Approval letter sent to scan center.  Per test claim, copay for 30 days supply is $0  Authorization # [id number] Phone # [phone]

## 2024-08-06 ENCOUNTER — Encounter (HOSPITAL_BASED_OUTPATIENT_CLINIC_OR_DEPARTMENT_OTHER): Payer: Self-pay | Admitting: Emergency Medicine

## 2024-08-06 ENCOUNTER — Ambulatory Visit (HOSPITAL_BASED_OUTPATIENT_CLINIC_OR_DEPARTMENT_OTHER)
Admission: EM | Admit: 2024-08-06 | Discharge: 2024-08-06 | Disposition: A | Discharge status: Home / Self Care | Attending: Physician Assistant | Admitting: Physician Assistant

## 2024-08-06 DIAGNOSIS — K0889 Other specified disorders of teeth and supporting structures: Secondary | ICD-10-CM

## 2024-08-06 MED ORDER — AMOXICILLIN-POT CLAVULANATE 875-125 MG PO TABS: 1.0000 | ORAL_TABLET | Freq: Two times a day (BID) | ORAL | 0 refills | Status: DC

## 2024-08-06 NOTE — ED Triage Notes (Signed)
 Pt c/o left upper tooth abscess the tooth is broken off and he is suppose to have it removed on Thursday, pt has swelling to left side of face.

## 2024-08-06 NOTE — Discharge Instructions (Addendum)
 Take antibiotic as prescribed. Can use cool compress for relief on the face. Keep upcoming dental appointment. Can take Tylenol  as needed for pain.

## 2024-08-06 NOTE — ED Provider Notes (Signed)
 [facility]    CSN: [id number] Arrival date & time: 08/06/24  1150      History   Chief Complaint Chief Complaint  Patient presents with   Dental Pain    HPI [name] is a 73 y.o. male.   Patient presents with left upper dental pain.  He has a broken tooth in this area and is post to have it extracted later this week.  He reports pain became worse today with left-sided facial swelling.  Denies fever, chills.    Past Medical History:  Diagnosis Date   Abnormal finding on GI tract imaging 09/07/2023   Acute diastolic CHF (congestive heart failure) (HCC) 05/07/2023   Acute on chronic diastolic CHF (congestive heart failure) (HCC) 05/06/2023   Acute on chronic respiratory failure with hypoxia (HCC) 05/06/2023   Aortic atherosclerosis 01/26/2023   Arthritis    Atrial fibrillation (HCC) 10/26/2011   Benign neoplasm of colon 09/07/2023   BPH (benign prostatic hyperplasia) 2011   BRONCHOPNEUMONIA ORGANISM UNSPECIFIED 06/12/2010         Replacing diagnoses that were inactivated after the 12/21/22 regulatory import   Carpal tunnel syndrome, right upper limb 11/03/2022   Chronic respiratory failure with hypoxia (HCC) 08/27/2015   Congestive heart failure (HCC)    COPD (chronic obstructive pulmonary disease) (HCC)    COPD with acute exacerbation (HCC) 05/26/2023   Coronary artery calcification 01/26/2023   Cubital tunnel syndrome on right 07/09/2023   Depression    DOE (dyspnea on exertion) 04/07/2023   Essential hypertension 06/05/2008   Qualifier: Diagnosis of   By: [name]), [name]       GERD (gastroesophageal reflux disease)    Hypothyroidism 03/30/2012   Lesion of ulnar nerve, right upper limb 11/03/2022   Lymphadenopathy syndrome 09/12/2015   Mediastinal adenopathy 09/12/2015   NHL (non-Hodgkin's lymphoma) (HCC) 09/25/2015   Numbness of right hand 06/28/2022   Obesity    Open wound of right hand without foreign body 07/27/2021   Other dysphagia  11/11/2010   Qualifier: Diagnosis of   By: [name], [name]       Polycythemia 01/26/2023   Port catheter in place 08/20/2016   Pulmonary hypertension (HCC)    Pure hypercholesterolemia 12/03/2011   Recurrent pneumonia 02/10/2012   Routine general medical examination at a health care facility 12/03/2011   Squamous cell carcinoma lung (HCC) 09/26/2010   S/p LUL resection Dr Brantley dec 2011      Status post bronchoscopy with biopsy    Stiffness of finger joint of right hand 09/24/2021   Testicular swelling 09/12/2015    Patient Active Problem List   Diagnosis Date Noted   COPD exacerbation (HCC) 04/17/2024   Senile purpura 04/05/2024   Hyperkalemia 03/31/2024   Multifocal pneumonia 03/31/2024   Sepsis due to pneumonia (HCC) 03/30/2024   Statin myopathy 02/21/2024   Chronic constipation 02/21/2024   Primary osteoarthritis of left distal radioulnar joint 02/21/2024   Primary osteoarthritis of right distal radioulnar joint 01/28/2024   Idiopathic chronic gout of multiple sites without tophus 01/24/2024   Cubital tunnel syndrome on right 07/09/2023   COPD with acute exacerbation (HCC) 05/26/2023   Chronic diastolic CHF (congestive heart failure) (HCC) 05/07/2023   Acute on chronic diastolic CHF (congestive heart failure) (HCC) 05/06/2023   Acute on chronic respiratory failure with hypoxia (HCC) 05/06/2023   DOE (dyspnea on exertion) 04/07/2023   Congestive heart failure (HCC) 01/26/2023   GERD (gastroesophageal reflux disease) 01/26/2023   Obesity 01/26/2023  Polycythemia 01/26/2023   Pulmonary hypertension (HCC) 01/26/2023   Aortic atherosclerosis 01/26/2023   Coronary artery calcification 01/26/2023   Carpal tunnel syndrome, right upper limb 11/03/2022   Lesion of ulnar nerve, right upper limb 11/03/2022   Numbness of right hand 06/28/2022   Status post bronchoscopy with biopsy    Port catheter in place 08/20/2016   COPD, severe (HCC) 11/27/2015   NHL (non-Hodgkin's  lymphoma) (HCC) 09/25/2015   Lymphadenopathy syndrome 09/12/2015   Mediastinal adenopathy 09/12/2015   Chronic respiratory failure with hypoxia (HCC) 08/27/2015   Hypothyroidism 03/30/2012   Depression 12/03/2011   Pure hypercholesterolemia 12/03/2011   Atrial fibrillation (HCC) 10/26/2011   Other dysphagia 11/11/2010   Squamous cell carcinoma lung (HCC) 09/26/2010   BPH (benign prostatic hyperplasia) 2011   ERECTILE DYSFUNCTION 09/24/2008   Essential hypertension 06/05/2008   COPD (chronic obstructive pulmonary disease) (HCC) 06/05/2008    Past Surgical History:  Procedure Laterality Date   APPENDECTOMY     BACK SURGERY     CARDIOVERSION  11/27/2011   Procedure: CARDIOVERSION;  Surgeon: Aleene JINNY Passe, MD;  Location: [facility] ENDOSCOPY;  Service: Cardiovascular;  Laterality: N/A;   COLONOSCOPY     COLONOSCOPY WITH PROPOFOL  N/A 09/07/2023   Procedure: COLONOSCOPY WITH PROPOFOL ;  Surgeon: Leigh Elspeth SQUIBB, MD;  Location: [facility] ENDOSCOPY;  Service: Gastroenterology;  Laterality: N/A;   FUDUCIAL PLACEMENT Right 10/13/2019   Procedure: Placement Of Fuducial;  Surgeon: Shelah Lamar RAMAN, MD;  Location: [facility] OR;  Service: Thoracic;  Laterality: Right;  Right Upper Lobe    LEFT HEART CATH AND CORONARY ANGIOGRAPHY N/A 04/12/2023   Procedure: LEFT HEART CATH AND CORONARY ANGIOGRAPHY;  Surgeon: Anner Alm ORN, MD;  Location: [facility] INVASIVE CV LAB;  Service: Cardiovascular;  Laterality: N/A;   LUNG CANCER SURGERY  2011   POLYPECTOMY  09/07/2023   Procedure: POLYPECTOMY;  Surgeon: Leigh Elspeth SQUIBB, MD;  Location: [facility] ENDOSCOPY;  Service: Gastroenterology;;   UPPER GI ENDOSCOPY     VIDEO BRONCHOSCOPY WITH ENDOBRONCHIAL NAVIGATION N/A 10/13/2019   Procedure: VIDEO BRONCHOSCOPY WITH ENDOBRONCHIAL NAVIGATION;  Surgeon: Shelah Lamar RAMAN, MD;  Location: [facility] OR;  Service: Thoracic;  Laterality: N/A;   VIDEO BRONCHOSCOPY WITH ENDOBRONCHIAL ULTRASOUND N/A 10/13/2019   Procedure: VIDEO BRONCHOSCOPY WITH  ENDOBRONCHIAL ULTRASOUND;  Surgeon: Shelah Lamar RAMAN, MD;  Location: [facility] OR;  Service: Thoracic;  Laterality: N/A;       Home Medications    Prior to Admission medications   Medication Sig Start Date End Date Taking? Authorizing Provider  allopurinol  (ZYLOPRIM ) 300 MG tablet Take 300 mg by mouth daily. 01/15/17  Yes [provider]  amoxicillin -clavulanate (AUGMENTIN ) 875-125 MG tablet Take 1 tablet by mouth every 12 (twelve) hours. 08/06/24  Yes Ward, Harlene PEDLAR, PA-C  apixaban  (ELIQUIS ) 5 MG TABS tablet Take 1 tablet by mouth twice daily 07/17/24  Yes Revankar, Rajan R, MD  azelastine (ASTELIN) 0.1 % nasal spray Place 2 sprays into both nostrils 2 (two) times daily. Use in each nostril as directed 07/28/24  Yes Geronimo Amel, MD  ezetimibe  (ZETIA ) 10 MG tablet Take 10 mg by mouth daily. 02/09/23  Yes [provider]  finasteride  (PROSCAR ) 5 MG tablet Take 5 mg by mouth daily. 03/13/19  Yes [provider]  furosemide  (LASIX ) 20 MG tablet Take 1 tablet by mouth once daily 03/13/24  Yes Anner Alm ORN, MD  levothyroxine  (SYNTHROID ) 175 MCG tablet Take 1 tablet (175 mcg total) by mouth daily before breakfast. 08/02/24  Yes [name], Jacob T, PA-C  metoprolol  succinate (TOPROL -XL) 25 MG 24 hr tablet Take 1 tablet by mouth once daily 07/26/24  Yes Revankar, Rajan R, MD  albuterol  (PROVENTIL ) (2.5 MG/3ML) 0.083% nebulizer solution Take 3 mLs (2.5 mg total) by nebulization every 6 (six) hours as needed for wheezing or shortness of breath. 05/24/24   [name], Jacob T, PA-C  albuterol  (VENTOLIN  HFA) 108 (90 Base) MCG/ACT inhaler Inhale 2 puffs into the lungs every 6 (six) hours as needed for wheezing or shortness of breath.    [provider]  amLODipine  (NORVASC ) 5 MG tablet Take 1 tablet (5 mg total) by mouth daily. 05/24/24   [name], Jacob T, PA-C  betamethasone , augmented, (DIPROLENE ) 0.05 % lotion Apply 1 Application topically daily as needed (dry skin on scalp).     [provider]  budesonide -glycopyrrolate -formoterol  (BREZTRI ) 160-9-4.8 MCG/ACT AERO inhaler Inhale 2 puffs into the lungs 2 (two) times daily. Patient taking differently: Inhale 2 puffs into the lungs in the morning, at noon, in the evening, and at bedtime. 04/21/24   Drusilla Sabas RAMAN, MD  Buprenorphine HCl-Naloxone HCl 8-2 MG FILM Place 0.33-1 Film under the tongue 2 (two) times daily as needed (back pain). Patient taking differently: Place 0.33-1 Film under the tongue as needed (back pain). Weekly as needed 03/24/23   [provider]  ciclopirox  (PENLAC ) 8 % solution Apply topically at bedtime. Apply a thin coat over nail. Apply daily over previous coat. Remove weekly with nail polish remover. Patient not taking: Reported on 08/02/2024 12/23/23   McCaughan, Dia D, DPM  Ferrous Sulfate  (IRON ) 325 (65 Fe) MG TABS Take 1 tablet (325 mg total) by mouth daily. 08/02/24   [name], Jacob T, PA-C  LINZESS  290 MCG CAPS capsule Take 1 capsule (290 mcg total) by mouth daily before breakfast. 08/02/24   [name], Jacob T, PA-C  Magnesium  Chloride 64 MG TABS Take 64 mg by mouth daily in the afternoon. 05/19/23   Carlin Delon BROCKS, NP  omeprazole  (PRILOSEC) 20 MG capsule Take 1 capsule (20 mg total) by mouth 2 (two) times daily. 10/13/19   Shelah Lamar RAMAN, MD  OXYGEN  Inhale 2 L/min into the lungs See admin instructions. Inhale 2 L/min into the lungs at bedtime and as needed for shortness of breath during the daytime Patient taking differently: Inhale 2-3 L/min into the lungs See admin instructions. Inhale 2 -3 L/min into the lungs at bedtime and as needed for shortness of breath during the daytime    [provider]  sodium chloride  (OCEAN) 0.65 % SOLN nasal spray Place 1 spray into both nostrils as needed for congestion.    [provider]  sodium chloride  HYPERTONIC 3 % nebulizer solution Take by nebulization 2 (two) times daily. 3 mL 3% saline nebulizer twice daily Patient not  taking: Reported on 08/02/2024 07/28/24   Geronimo Amel, MD  zolpidem  (AMBIEN ) 10 MG tablet Take 1 tablet (10 mg total) by mouth at bedtime as needed for sleep. 05/02/24   [name], Lang DASEN, PA-C    Family History Family History  Problem Relation Age of Onset   Heart disease Father    Cancer Neg Hx    Diabetes Neg Hx    Stroke Neg Hx    Hypertension Neg Hx    Kidney disease Neg Hx     Social History Social History   Tobacco Use   Smoking status: Former    Current packs/day: 0.00    Average packs/day: 3.0 packs/day for 43.0 years (129.0 ttl pk-yrs)  Types: Cigarettes    Start date: 10/22/1958    Quit date: 10/22/2001    Years since quitting: 22.8    Passive exposure: Current   Smokeless tobacco: Never  Vaping Use   Vaping status: Never Used  Substance Use Topics   Alcohol  use: No   Drug use: No     Allergies   Hydrochlorothiazide , Lipitor [atorvastatin ], Omnipaque  [iohexol ], and Tape   Review of Systems Review of Systems  Constitutional:  Negative for chills and fever.  HENT:  Positive for dental problem. Negative for ear pain and sore throat.   Eyes:  Negative for pain and visual disturbance.  Respiratory:  Negative for cough and shortness of breath.   Cardiovascular:  Negative for chest pain and palpitations.  Gastrointestinal:  Negative for abdominal pain and vomiting.  Genitourinary:  Negative for dysuria and hematuria.  Musculoskeletal:  Negative for arthralgias and back pain.  Skin:  Negative for color change and rash.  Neurological:  Negative for seizures and syncope.  All other systems reviewed and are negative.    Physical Exam Triage Vital Signs ED Triage Vitals  Encounter Vitals Group     BP 08/06/24 1208 134/68     Girls Systolic BP Percentile --      Girls Diastolic BP Percentile --      Boys Systolic BP Percentile --      Boys Diastolic BP Percentile --      Pulse Rate 08/06/24 1208 63     Resp 08/06/24 1208 18     Temp 08/06/24 1208  97.8 F (36.6 C)     Temp Source 08/06/24 1208 Oral     SpO2 08/06/24 1208 92 %     Weight --      Height --      Head Circumference --      Peak Flow --      Pain Score 08/06/24 1207 5     Pain Loc --      Pain Education --      Exclude from Growth Chart --    No data found.  Updated Vital Signs BP 134/68 (BP Location: Left Arm)   Pulse 63   Temp 97.8 F (36.6 C) (Oral)   Resp 18   SpO2 92%   Visual Acuity Right Eye Distance:   Left Eye Distance:   Bilateral Distance:    Right Eye Near:   Left Eye Near:    Bilateral Near:     Physical Exam Vitals and nursing note reviewed.  Constitutional:      General: He is not in acute distress.    Appearance: He is well-developed.  HENT:     Head: Normocephalic and atraumatic.     Mouth/Throat:     Comments: Left upper molar is broken with surrounding gum swelling and redness. Eyes:     Conjunctiva/sclera: Conjunctivae normal.  Cardiovascular:     Rate and Rhythm: Normal rate and regular rhythm.     Heart sounds: No murmur heard. Pulmonary:     Effort: Pulmonary effort is normal. No respiratory distress.     Breath sounds: Normal breath sounds.  Abdominal:     Palpations: Abdomen is soft.     Tenderness: There is no abdominal tenderness.  Musculoskeletal:        General: No swelling.     Cervical back: Neck supple.  Skin:    General: Skin is warm and dry.     Capillary Refill: Capillary refill takes less than  2 seconds.  Neurological:     Mental Status: He is alert.  Psychiatric:        Mood and Affect: Mood normal.      UC Treatments / Results  Labs (all labs ordered are listed, but only abnormal results are displayed) Labs Reviewed - No data to display  EKG   Radiology No results found.  Procedures Procedures (including critical care time)  Medications Ordered in UC Medications - No data to display  Initial Impression / Assessment and Plan / UC Course  I have reviewed the triage vital signs  and the nursing notes.  Pertinent labs & imaging results that were available during my care of the patient were reviewed by me and considered in my medical decision making (see chart for details).     Will start antibiotic.  Supportive care discussed.  Advised to keep upcoming dental appointment. Final Clinical Impressions(s) / UC Diagnoses   Final diagnoses:  Pain, dental     Discharge Instructions      Take antibiotic as prescribed. Can use cool compress for relief on the face. Keep upcoming dental appointment. Can take Tylenol  as needed for pain.   ED Prescriptions     Medication Sig Dispense Auth. Provider   amoxicillin -clavulanate (AUGMENTIN ) 875-125 MG tablet Take 1 tablet by mouth every 12 (twelve) hours. 14 tablet Ward, Todd Jelinski Z, PA-C      PDMP not reviewed this encounter.   Ward, Harlene PEDLAR, PA-C 08/06/24 1236

## 2024-08-08 ENCOUNTER — Inpatient Hospital Stay: Attending: Internal Medicine

## 2024-08-08 DIAGNOSIS — Z452 Encounter for adjustment and management of vascular access device: Secondary | ICD-10-CM | POA: Insufficient documentation

## 2024-08-08 DIAGNOSIS — Z8572 Personal history of non-Hodgkin lymphomas: Secondary | ICD-10-CM | POA: Insufficient documentation

## 2024-08-09 ENCOUNTER — Telehealth: Payer: Self-pay

## 2024-08-09 NOTE — Telephone Encounter (Signed)
 Pt called having questions about his Ohtuvayre . I informed pt that he nwould use his Ohtuvayre  BID along with his Breztri  and he would only use his albuterol  neb and inhaler PRN Q6H.   Pt verbalized understanding and stated his received his Ohtuvarye yesterday.   Pt also stated that he ran out of Breztri  and needs a rx sent in. The rx is under Dr Sabas Brod (hospitalist) and pt would like to know if Dr Geronimo would send in refills. Pt prefers to use the [facility] on [address].   Dr Geronimo, is this okay?

## 2024-08-09 NOTE — Telephone Encounter (Signed)
 Copied from CRM [id number]. Topic: Clinical - Prescription Issue >> Aug 09, 2024  8:26 AM Leila BROCKS wrote: Reason for CRM: Patient [phone] states new medication Othuvayre start yesterday.  Patient was using albuterol  (PROVENTIL ) (2.5 MG/3ML) 0.083% nebulizer solution every six hours/ four times a day. Does patient need to do in between [place] also? Please clarification, and advise. Per CAL, CMA is unavailabe send a crm. Please call back.

## 2024-08-10 ENCOUNTER — Ambulatory Visit: Admitting: Cardiology

## 2024-08-10 NOTE — Telephone Encounter (Signed)
 The albuterol  is as needed if there is a crisis.  Otherwise he should just do Othuvayre twice daily which is scheduled

## 2024-08-11 ENCOUNTER — Other Ambulatory Visit (HOSPITAL_COMMUNITY): Payer: Self-pay

## 2024-08-11 MED ORDER — BUDESON-GLYCOPYRROL-FORMOTEROL 160-9-4.8 MCG/ACT IN AERO: 2.0000 | INHALATION_SPRAY | Freq: Two times a day (BID) | RESPIRATORY_TRACT | 12 refills | Status: AC

## 2024-08-11 NOTE — Telephone Encounter (Signed)
 Called and spoke to patient refilled breztri ,clarified instructions verbalized understanding.NFN

## 2024-08-14 ENCOUNTER — Encounter (HOSPITAL_BASED_OUTPATIENT_CLINIC_OR_DEPARTMENT_OTHER): Payer: Self-pay | Admitting: Student

## 2024-08-14 ENCOUNTER — Ambulatory Visit (INDEPENDENT_AMBULATORY_CARE_PROVIDER_SITE_OTHER): Admitting: Student

## 2024-08-14 VITALS — BP 150/67 | HR 63 | Temp 98.1°F | Resp 18 | Ht 72.0 in | Wt 212.1 lb

## 2024-08-14 DIAGNOSIS — K219 Gastro-esophageal reflux disease without esophagitis: Secondary | ICD-10-CM | POA: Diagnosis not present

## 2024-08-14 DIAGNOSIS — N401 Enlarged prostate with lower urinary tract symptoms: Secondary | ICD-10-CM

## 2024-08-14 LAB — POCT URINALYSIS DIP (CLINITEK)
Bilirubin, UA: NEGATIVE
Blood, UA: NEGATIVE
Glucose, UA: NEGATIVE mg/dL
Ketones, POC UA: NEGATIVE mg/dL
Leukocytes, UA: NEGATIVE
Nitrite, UA: NEGATIVE
POC PROTEIN,UA: NEGATIVE
Spec Grav, UA: 1.02 (ref 1.010–1.025)
Urobilinogen, UA: 0.2 U/dL
pH, UA: 5.5 (ref 5.0–8.0)

## 2024-08-14 MED ORDER — OMEPRAZOLE 20 MG PO CPDR: 20.0000 mg | DELAYED_RELEASE_CAPSULE | Freq: Two times a day (BID) | ORAL | 3 refills | Status: AC

## 2024-08-14 MED ORDER — TAMSULOSIN HCL 0.4 MG PO CAPS: 0.4000 mg | ORAL_CAPSULE | Freq: Every day | ORAL | 3 refills | Status: AC

## 2024-08-14 NOTE — Patient Instructions (Signed)
 It was nice to see you today!  As we discussed in clinic:  - Let me know how the new flomax  is doing in about 6 weeks. You will take flomax  daily. Please let me know if you experience any side effects.  If you have any problems before your next visit feel free to message me via MyChart (minor issues or questions) or call the office, otherwise you may reach out to schedule an office visit.  Thank you! Adiya Selmer, PA-C

## 2024-08-14 NOTE — Progress Notes (Signed)
 Acute Office Visit  Subjective:     Patient ID: [name], male    DOB: 29-Jul-1951, 73 y.o.   MRN: [id number]  Chief Complaint  Patient presents with   Medication Refill    Needs 90 day supply refill for omeprazole    Urinary Tract Infection    Feels like bladder or prostate is swollen. Feels like has to go constantly and when he goes, does not have to urinate a lot.    HPI  Discussed the use of AI scribe software for clinical note transcription with the patient, who gave verbal consent to proceed.  History of Present Illness   [name] is a 73 year old male with benign prostatic hyperplasia who presents with urinary issues.  He has been experiencing urinary urgency for the past three to four months, often unable to urinate upon reaching the bathroom. No fever, chills, sweats, or dysuria. He has been on finasteride  for about a year.  He is currently taking omeprazole . He has not used tamsulosin  before.  He has a history of COPD and uses albuterol  for shortness of breath. He recently started a new medication, which he has taken twice, and experienced difficulty breathing, using albuterol  during that time.      ROS Per HPI     Objective:    BP (!) 150/67   Pulse 63   Temp 98.1 F (36.7 C) (Oral)   Resp 18   Ht 6' (1.829 m)   Wt 212 lb 1.6 oz (96.2 kg)   SpO2 95%   PF (!) 2 L/min   BMI 28.77 kg/m    Physical Exam Constitutional:      General: He is not in acute distress.    Appearance: Normal appearance. He is not ill-appearing.  HENT:     Head: Normocephalic and atraumatic.     Right Ear: External ear normal.     Left Ear: External ear normal.     Nose: Nose normal.  Eyes:     Conjunctiva/sclera: Conjunctivae normal.  Cardiovascular:     Rate and Rhythm: Normal rate and regular rhythm.     Pulses: Normal pulses.     Heart sounds: Normal heart sounds. No murmur heard.    No friction rub.  Pulmonary:     Effort: Pulmonary effort is normal. No  respiratory distress.     Breath sounds: Normal breath sounds. No wheezing or rales.     Comments: baseline Skin:    General: Skin is warm and dry.     Coloration: Skin is not jaundiced or pale.  Neurological:     Mental Status: He is alert.  Psychiatric:        Mood and Affect: Mood normal.        Behavior: Behavior normal.     Results for orders placed or performed in visit on 08/14/24  POCT URINALYSIS DIP (CLINITEK)  Result Value Ref Range   Color, UA yellow yellow   Clarity, UA clear clear   Glucose, UA negative negative mg/dL   Bilirubin, UA negative negative   Ketones, POC UA negative negative mg/dL   Spec Grav, UA 8.979 8.989 - 1.025   Blood, UA negative negative   pH, UA 5.5 5.0 - 8.0   POC PROTEIN,UA negative negative, trace   Urobilinogen, UA 0.2 0.2 or 1.0 E.U./dL   Nitrite, UA Negative Negative   Leukocytes, UA Negative Negative        Assessment & Plan:   Assessment  and Plan    Benign prostatic hyperplasia with lower urinary tract symptoms Chronic issue, not at goal. Chronic urinary retention and difficulty initiating urination for 3-4 months. Currently on finasteride , which is expected to help shrink the prostate over time. No fever, chills, sweats, or dysuria reported. Symptoms consistent with BPH. - Continue finasteride . - Initiated tamsulosin  to relax smooth muscles and improve urinary flow. - Discussed referral to urology- patient deferred for now.  - Monitor blood pressure due to potential lowering effect of tamsulosin .  Gastroesophageal reflux disease Chronic, stable. Chronic GERD managed with omeprazole . - Prescribed omeprazole  90-day supply with refills for one year.      No follow-ups on file.  Ramzey Petrovic T Mervyn Pflaum, PA-C

## 2024-08-15 ENCOUNTER — Ambulatory Visit: Attending: Cardiology | Admitting: Cardiology

## 2024-08-15 ENCOUNTER — Encounter: Payer: Self-pay | Admitting: Cardiology

## 2024-08-15 VITALS — BP 130/70 | HR 70 | Ht 72.0 in | Wt 212.8 lb

## 2024-08-15 DIAGNOSIS — E7800 Pure hypercholesterolemia, unspecified: Secondary | ICD-10-CM

## 2024-08-15 DIAGNOSIS — I700 Atherosclerosis of aorta: Secondary | ICD-10-CM

## 2024-08-15 DIAGNOSIS — I2510 Atherosclerotic heart disease of native coronary artery without angina pectoris: Secondary | ICD-10-CM | POA: Diagnosis not present

## 2024-08-15 DIAGNOSIS — I10 Essential (primary) hypertension: Secondary | ICD-10-CM

## 2024-08-15 DIAGNOSIS — I480 Paroxysmal atrial fibrillation: Secondary | ICD-10-CM

## 2024-08-15 MED ORDER — PITAVASTATIN CALCIUM 1 MG PO TABS: 1.0000 mg | ORAL_TABLET | Freq: Every day | ORAL | 3 refills | Status: AC

## 2024-08-15 MED ORDER — FUROSEMIDE 20 MG PO TABS: 20.0000 mg | ORAL_TABLET | Freq: Every day | ORAL | 3 refills | Status: AC

## 2024-08-15 NOTE — Progress Notes (Signed)
 Cardiology Office Note:    Date:  08/15/2024   ID:  [name], DOB [birth year], MRN [id number]  PCP:  Iven Lang DASEN, PA-C  Cardiologist:  Jennifer JONELLE Crape, MD   Referring MD: Iven Lang DASEN, PA-C    ASSESSMENT:    1. Aortic atherosclerosis   2. Coronary artery calcification   3. Essential hypertension   4. Paroxysmal atrial fibrillation (HCC)   5. Pure hypercholesterolemia    PLAN:    In order of problems listed above:  Coronary artery calcification: Secondary prevention stressed with the patient.  Importance of compliance with diet medication stressed and patient verbalized standing.  He was advised to ambulate to the best of his ability. Mixed dyslipidemia: On lipid-lowering medications.  Not on statins.  He has had issues in the past but is willing to try 1 mg pitavastatin  daily.  He will be back in 6 weeks for lipid check.  He will take supplemental co-Q10.  He will get this if the medicine does not work for him. Essential hypertension: Blood pressure is stable and diet was emphasized. COPD: Stable: Dependent on oxygen  and followed by primary care. Paroxysmal atrial fibrillation:I discussed with the patient atrial fibrillation, disease process. Management and therapy including rate and rhythm control, anticoagulation benefits and potential risks were discussed extensively with the patient. Patient had multiple questions which were answered to patient's satisfaction. Patient will be seen in follow-up appointment in 6 months or earlier if the patient has any concerns.    Medication Adjustments/Labs and Tests Ordered: Current medicines are reviewed at length with the patient today.  Concerns regarding medicines are outlined above.  No orders of the defined types were placed in this encounter.  No orders of the defined types were placed in this encounter.    No chief complaint on file.    History of Present Illness:    [name] is a 73 y.o. male.   Patient has come for follow-up.  He has coronary artery calcification, essential hypertension, mixed dyslipidemia and COPD.  He has paroxysmal atrial fibrillation on anticoagulation.  Overall he leads a sedentary lifestyle.  He denies any chest pain orthopnea or PND.  At the time of my evaluation, the patient is alert awake oriented and in no distress.  Past Medical History:  Diagnosis Date   Acute diastolic CHF (congestive heart failure) (HCC) 05/07/2023   Acute on chronic diastolic CHF (congestive heart failure) (HCC) 05/06/2023   Acute on chronic respiratory failure with hypoxia (HCC) 05/06/2023   Aortic atherosclerosis 01/26/2023   Atrial fibrillation (HCC) 10/26/2011   BPH (benign prostatic hyperplasia) 2011   Carpal tunnel syndrome, right upper limb 11/03/2022   Chronic constipation 02/21/2024   Chronic diastolic CHF (congestive heart failure) (HCC) 05/07/2023   Chronic respiratory failure with hypoxia (HCC) 08/27/2015   Congestive heart failure (HCC)    COPD (chronic obstructive pulmonary disease) (HCC)    COPD exacerbation (HCC) 04/17/2024   COPD with acute exacerbation (HCC) 05/26/2023   COPD, severe (HCC) 11/27/2015   12/18/2015-pulmonary function test- FVC 2.75 (55% predicted), postbronchodilator ratio 56, postbronchodilator FEV1 1.66 (45% predicted), no significant bronchodilator response, mid flow reversibility, DLCO 33  >>>Severe obstructive airways disease, Severe diffusion defect        Coronary artery calcification 01/26/2023   Cubital tunnel syndrome on right 07/09/2023   Depression    DOE (dyspnea on exertion) 04/07/2023   Essential hypertension 06/05/2008   Qualifier: Diagnosis of   By: [name]), Othel  GERD (gastroesophageal reflux disease)    Hyperkalemia 03/31/2024   Hypothyroidism 03/30/2012   Idiopathic chronic gout of multiple sites without tophus 01/24/2024   Lesion of ulnar nerve, right upper limb 11/03/2022   Lymphadenopathy syndrome 09/12/2015    Mediastinal adenopathy 09/12/2015   Multifocal pneumonia 03/31/2024   NHL (non-Hodgkin's lymphoma) (HCC) 09/25/2015   Numbness of right hand 06/28/2022   Obesity    Other dysphagia 11/11/2010   Qualifier: Diagnosis of   By: [name]       Polycythemia 01/26/2023   Port catheter in place 08/20/2016   Primary osteoarthritis of left distal radioulnar joint 02/21/2024   Primary osteoarthritis of right distal radioulnar joint 01/28/2024   Pulmonary hypertension (HCC)    Pure hypercholesterolemia 12/03/2011   Sepsis due to pneumonia (HCC) 03/30/2024   Squamous cell carcinoma lung (HCC) 09/26/2010   S/p LUL resection Dr Brantley dec 2011      Statin myopathy 02/21/2024   Status post bronchoscopy with biopsy     Past Surgical History:  Procedure Laterality Date   APPENDECTOMY     BACK SURGERY     CARDIOVERSION  11/27/2011   Procedure: CARDIOVERSION;  Surgeon: Aleene JINNY Passe, MD;  Location: [facility] ENDOSCOPY;  Service: Cardiovascular;  Laterality: N/A;   COLONOSCOPY     COLONOSCOPY WITH PROPOFOL  N/A 09/07/2023   Procedure: COLONOSCOPY WITH PROPOFOL ;  Surgeon: Leigh Elspeth SQUIBB, MD;  Location: [facility] ENDOSCOPY;  Service: Gastroenterology;  Laterality: N/A;   FUDUCIAL PLACEMENT Right 10/13/2019   Procedure: Placement Of Fuducial;  Surgeon: Shelah Lamar RAMAN, MD;  Location: [facility] OR;  Service: Thoracic;  Laterality: Right;  Right Upper Lobe    LEFT HEART CATH AND CORONARY ANGIOGRAPHY N/A 04/12/2023   Procedure: LEFT HEART CATH AND CORONARY ANGIOGRAPHY;  Surgeon: Anner Alm ORN, MD;  Location: [facility] INVASIVE CV LAB;  Service: Cardiovascular;  Laterality: N/A;   LUNG CANCER SURGERY  2011   POLYPECTOMY  09/07/2023   Procedure: POLYPECTOMY;  Surgeon: Leigh Elspeth SQUIBB, MD;  Location: [facility] ENDOSCOPY;  Service: Gastroenterology;;   UPPER GI ENDOSCOPY     VIDEO BRONCHOSCOPY WITH ENDOBRONCHIAL NAVIGATION N/A 10/13/2019   Procedure: VIDEO BRONCHOSCOPY WITH ENDOBRONCHIAL NAVIGATION;  Surgeon: Shelah Lamar RAMAN, MD;  Location: [facility] OR;  Service: Thoracic;  Laterality: N/A;   VIDEO BRONCHOSCOPY WITH ENDOBRONCHIAL ULTRASOUND N/A 10/13/2019   Procedure: VIDEO BRONCHOSCOPY WITH ENDOBRONCHIAL ULTRASOUND;  Surgeon: Shelah Lamar RAMAN, MD;  Location: [facility] OR;  Service: Thoracic;  Laterality: N/A;    Current Medications: Current Meds  Medication Sig   albuterol  (PROVENTIL ) (2.5 MG/3ML) 0.083% nebulizer solution Take 3 mLs (2.5 mg total) by nebulization every 6 (six) hours as needed for wheezing or shortness of breath.   albuterol  (VENTOLIN  HFA) 108 (90 Base) MCG/ACT inhaler Inhale 2 puffs into the lungs every 6 (six) hours as needed for wheezing or shortness of breath.   allopurinol  (ZYLOPRIM ) 300 MG tablet Take 300 mg by mouth daily.   amLODipine  (NORVASC ) 5 MG tablet Take 1 tablet (5 mg total) by mouth daily.   apixaban  (ELIQUIS ) 5 MG TABS tablet Take 1 tablet by mouth twice daily   azelastine  (ASTELIN ) 0.1 % nasal spray Place 2 sprays into both nostrils 2 (two) times daily. Use in each nostril as directed   betamethasone , augmented, (DIPROLENE ) 0.05 % lotion Apply 1 Application topically daily as needed (dry skin on scalp).   budesonide -glycopyrrolate -formoterol  (BREZTRI ) 160-9-4.8 MCG/ACT AERO inhaler Inhale 2 puffs into the lungs 2 (two) times daily.   Buprenorphine HCl-Naloxone  HCl 8-2 MG FILM Place 0.33-1 Film under the tongue 2 (two) times daily as needed (back pain).   ciclopirox  (PENLAC ) 8 % solution Apply topically at bedtime. Apply a thin coat over nail. Apply daily over previous coat. Remove weekly with nail polish remover.   ezetimibe  (ZETIA ) 10 MG tablet Take 10 mg by mouth daily.   Ferrous Sulfate  (IRON ) 325 (65 Fe) MG TABS Take 1 tablet (325 mg total) by mouth daily.   finasteride  (PROSCAR ) 5 MG tablet Take 5 mg by mouth daily.   furosemide  (LASIX ) 20 MG tablet Take 1 tablet by mouth once daily   levothyroxine  (SYNTHROID ) 175 MCG tablet Take 1 tablet (175 mcg total) by mouth daily before  breakfast.   LINZESS  290 MCG CAPS capsule Take 1 capsule (290 mcg total) by mouth daily before breakfast.   Magnesium  Chloride 64 MG TABS Take 64 mg by mouth daily in the afternoon.   metoprolol  succinate (TOPROL -XL) 25 MG 24 hr tablet Take 1 tablet by mouth once daily   OHTUVAYRE  3 MG/2.5ML SUSP Inhale into the lungs as needed. NEB SOLUTION   omeprazole  (PRILOSEC) 20 MG capsule Take 1 capsule (20 mg total) by mouth 2 (two) times daily.   OXYGEN  Inhale 2 L/min into the lungs See admin instructions. Inhale 2 L/min into the lungs at bedtime and as needed for shortness of breath during the daytime   sodium chloride  (OCEAN) 0.65 % SOLN nasal spray Place 1 spray into both nostrils as needed for congestion.   sodium chloride  HYPERTONIC 3 % nebulizer solution Take by nebulization 2 (two) times daily. 3 mL 3% saline nebulizer twice daily   tamsulosin  (FLOMAX ) 0.4 MG CAPS capsule Take 1 capsule (0.4 mg total) by mouth daily.   zolpidem  (AMBIEN ) 10 MG tablet Take 1 tablet (10 mg total) by mouth at bedtime as needed for sleep.     Allergies:   Hydrochlorothiazide , Lipitor [atorvastatin ], Omnipaque  [iohexol ], and Tape   Social History   Socioeconomic History   Marital status: Married    Spouse name: Not on file   Number of children: 1   Years of education: Not on file   Highest education level: 9th grade  Occupational History   Occupation: truck driver   Occupation: truck hospital doctor  Tobacco Use   Smoking status: Former    Current packs/day: 0.00    Average packs/day: 3.0 packs/day for 43.0 years (129.0 ttl pk-yrs)    Types: Cigarettes    Start date: 10/22/1958    Quit date: 10/22/2001    Years since quitting: 22.8    Passive exposure: Current   Smokeless tobacco: Never  Vaping Use   Vaping status: Never Used  Substance and Sexual Activity   Alcohol  use: No   Drug use: No   Sexual activity: Yes  Other Topics Concern   Not on file  Social History Narrative   Resides in [place] with his  wife.  Has 1 daughter and 1 granddaughter.   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   Social Drivers of Corporate Investment Banker Strain: Low Risk  (08/01/2024)   Overall Financial Resource Strain (CARDIA)    Difficulty of Paying Living Expenses: Not very hard  Food Insecurity: No Food Insecurity (08/01/2024)   Hunger Vital Sign    Worried About Running Out of Food in the Last Year: Never true    Ran Out of Food in the Last Year: Never true  Transportation Needs: No Transportation  Needs (08/01/2024)   PRAPARE - Administrator, Civil Service (Medical): No    Lack of Transportation (Non-Medical): No  Physical Activity: Inactive (08/01/2024)   Exercise Vital Sign    Days of Exercise per Week: 0 days    Minutes of Exercise per Session: Not on file  Stress: No Stress Concern Present (08/01/2024)   [employer] of Occupational Health - Occupational Stress Questionnaire    Feeling of Stress: Only a little  Social Connections: Moderately Integrated (08/01/2024)   Social Connection and Isolation Panel    Frequency of Communication with Friends and Family: Three times a week    Frequency of Social Gatherings with Friends and Family: Never    Attends Religious Services: More than 4 times per year    Active Member of [employer] or Organizations: No    Attends Engineer, Structural: Not on file    Marital Status: Married     Family History: The patient's family history includes Heart disease in his father. There is no history of Cancer, Diabetes, Stroke, Hypertension, or Kidney disease.  ROS:   Please see the history of present illness.    All other systems reviewed and are negative.  EKGs/Labs/Other Studies Reviewed:    The following studies were reviewed today: I discussed my findings with the patient at length    Recent Labs: 04/17/2024: B Natriuretic Peptide 331.5 04/19/2024: Magnesium  2.1 06/15/2024: ALT 17; BUN 24; Creatinine  1.01; Hemoglobin 10.3; Platelet Count 158; Potassium 4.4; Sodium 142 08/02/2024: TSH 0.387  Recent Lipid Panel    Component Value Date/Time   CHOL 162 08/02/2024 1029   TRIG 39 08/02/2024 1029   HDL 49 08/02/2024 1029   CHOLHDL 3.3 08/02/2024 1029   CHOLHDL 5 12/03/2011 1024   VLDL 17.0 12/03/2011 1024   LDLCALC 104 (H) 08/02/2024 1029   LDLDIRECT 99 05/18/2023 1548   LDLDIRECT 205.4 12/03/2011 1024    Physical Exam:    VS:  BP 130/70   Pulse 70   Ht 6' (1.829 m)   Wt 212 lb 12.8 oz (96.5 kg)   SpO2 92%   BMI 28.86 kg/m     Wt Readings from Last 3 Encounters:  08/15/24 212 lb 12.8 oz (96.5 kg)  08/14/24 212 lb 1.6 oz (96.2 kg)  08/02/24 215 lb 6.4 oz (97.7 kg)     GEN: Patient is in no acute distress HEENT: Normal NECK: No JVD; No carotid bruits LYMPHATICS: No lymphadenopathy CARDIAC: Hear sounds regular, 2/6 systolic murmur at the apex. RESPIRATORY:  Clear to auscultation without rales, wheezing or rhonchi  ABDOMEN: Soft, non-tender, non-distended MUSCULOSKELETAL:  No edema; No deformity  SKIN: Warm and dry NEUROLOGIC:  Alert and oriented x 3 PSYCHIATRIC:  Normal affect   Signed, Jennifer JONELLE Crape, MD  08/15/2024 2:39 PM    [facility]

## 2024-08-15 NOTE — Patient Instructions (Signed)
 Medication Instructions:  Your physician has recommended you make the following change in your medication:   Start Livalo  1 mg daily  Take CoQ 10 200 mg daily (with supplements/vitamins)   *If you need a refill on your cardiac medications before your next appointment, please call your pharmacy*   Lab Work: Your physician recommends that you return for lab work in: 6 weeks for fasting lipids and CMP You need to have labs done when you are fasting.  You can come Monday through Friday 8:30 am to 12:00 pm and 1:15 to 4:30. You do not need to make an appointment as the order has already been placed.   If you have labs (blood work) drawn today and your tests are completely normal, you will receive your results only by: MyChart Message (if you have MyChart) OR A paper copy in the mail If you have any lab test that is abnormal or we need to change your treatment, we will call you to review the results.   Testing/Procedures: None ordered   Follow-Up: At [facility], you and your health needs are our priority.  As part of our continuing mission to provide you with exceptional heart care, we have created designated Provider Care Teams.  These Care Teams include your primary Cardiologist (physician) and Advanced Practice Providers (APPs -  Physician Assistants and Nurse Practitioners) who all work together to provide you with the care you need, when you need it.  We recommend signing up for the patient portal called MyChart.  Sign up information is provided on this After Visit Summary.  MyChart is used to connect with patients for Virtual Visits (Telemedicine).  Patients are able to view lab/test results, encounter notes, upcoming appointments, etc.  Non-urgent messages can be sent to your provider as well.   To learn more about what you can do with MyChart, go to [email or web address].    Your next appointment:   9 month(s)  The format for your next appointment:   In  Person  Provider:   Jennifer Crape, MD    Other Instructions none  Important Information About Sugar

## 2024-08-18 ENCOUNTER — Inpatient Hospital Stay (HOSPITAL_COMMUNITY)
Admission: EM | Admit: 2024-08-18 | Discharge: 2024-08-21 | DRG: 193 | Disposition: A | Attending: Internal Medicine | Admitting: Internal Medicine

## 2024-08-18 ENCOUNTER — Ambulatory Visit (INDEPENDENT_AMBULATORY_CARE_PROVIDER_SITE_OTHER): Admit: 2024-08-18 | Discharge: 2024-08-18 | Disposition: A | Discharge status: Home / Self Care | Admitting: Radiology

## 2024-08-18 ENCOUNTER — Other Ambulatory Visit: Payer: Self-pay

## 2024-08-18 ENCOUNTER — Ambulatory Visit (HOSPITAL_BASED_OUTPATIENT_CLINIC_OR_DEPARTMENT_OTHER)
Admission: EM | Admit: 2024-08-18 | Discharge: 2024-08-18 | Disposition: A | Discharge status: Home / Self Care | Attending: Family Medicine | Admitting: Family Medicine

## 2024-08-18 ENCOUNTER — Encounter (HOSPITAL_COMMUNITY): Payer: Self-pay | Admitting: Emergency Medicine

## 2024-08-18 ENCOUNTER — Encounter (HOSPITAL_BASED_OUTPATIENT_CLINIC_OR_DEPARTMENT_OTHER): Payer: Self-pay | Admitting: Emergency Medicine

## 2024-08-18 DIAGNOSIS — J189 Pneumonia, unspecified organism: Principal | ICD-10-CM | POA: Diagnosis present

## 2024-08-18 DIAGNOSIS — J9601 Acute respiratory failure with hypoxia: Secondary | ICD-10-CM

## 2024-08-18 DIAGNOSIS — R0902 Hypoxemia: Secondary | ICD-10-CM | POA: Diagnosis not present

## 2024-08-18 DIAGNOSIS — R059 Cough, unspecified: Secondary | ICD-10-CM | POA: Diagnosis not present

## 2024-08-18 DIAGNOSIS — M109 Gout, unspecified: Secondary | ICD-10-CM | POA: Diagnosis present

## 2024-08-18 DIAGNOSIS — E7800 Pure hypercholesterolemia, unspecified: Secondary | ICD-10-CM | POA: Diagnosis present

## 2024-08-18 DIAGNOSIS — C8590 Non-Hodgkin lymphoma, unspecified, unspecified site: Secondary | ICD-10-CM | POA: Diagnosis present

## 2024-08-18 DIAGNOSIS — E875 Hyperkalemia: Secondary | ICD-10-CM | POA: Diagnosis present

## 2024-08-18 DIAGNOSIS — J449 Chronic obstructive pulmonary disease, unspecified: Secondary | ICD-10-CM | POA: Diagnosis present

## 2024-08-18 DIAGNOSIS — R1319 Other dysphagia: Secondary | ICD-10-CM

## 2024-08-18 DIAGNOSIS — I5032 Chronic diastolic (congestive) heart failure: Secondary | ICD-10-CM | POA: Diagnosis present

## 2024-08-18 DIAGNOSIS — K219 Gastro-esophageal reflux disease without esophagitis: Secondary | ICD-10-CM | POA: Diagnosis present

## 2024-08-18 DIAGNOSIS — N400 Enlarged prostate without lower urinary tract symptoms: Secondary | ICD-10-CM | POA: Diagnosis present

## 2024-08-18 DIAGNOSIS — C3490 Malignant neoplasm of unspecified part of unspecified bronchus or lung: Secondary | ICD-10-CM | POA: Diagnosis present

## 2024-08-18 DIAGNOSIS — E039 Hypothyroidism, unspecified: Secondary | ICD-10-CM | POA: Diagnosis present

## 2024-08-18 DIAGNOSIS — K5909 Other constipation: Secondary | ICD-10-CM | POA: Diagnosis present

## 2024-08-18 DIAGNOSIS — R5381 Other malaise: Secondary | ICD-10-CM | POA: Diagnosis present

## 2024-08-18 DIAGNOSIS — G720 Drug-induced myopathy: Secondary | ICD-10-CM | POA: Diagnosis present

## 2024-08-18 DIAGNOSIS — I272 Pulmonary hypertension, unspecified: Secondary | ICD-10-CM | POA: Diagnosis present

## 2024-08-18 DIAGNOSIS — I480 Paroxysmal atrial fibrillation: Secondary | ICD-10-CM | POA: Diagnosis present

## 2024-08-18 DIAGNOSIS — R051 Acute cough: Secondary | ICD-10-CM | POA: Diagnosis not present

## 2024-08-18 DIAGNOSIS — I10 Essential (primary) hypertension: Secondary | ICD-10-CM | POA: Diagnosis present

## 2024-08-18 DIAGNOSIS — J441 Chronic obstructive pulmonary disease with (acute) exacerbation: Secondary | ICD-10-CM | POA: Diagnosis present

## 2024-08-18 DIAGNOSIS — R1310 Dysphagia, unspecified: Secondary | ICD-10-CM

## 2024-08-18 DIAGNOSIS — J9621 Acute and chronic respiratory failure with hypoxia: Secondary | ICD-10-CM | POA: Diagnosis present

## 2024-08-18 DIAGNOSIS — N179 Acute kidney failure, unspecified: Secondary | ICD-10-CM | POA: Diagnosis present

## 2024-08-18 DIAGNOSIS — Z95828 Presence of other vascular implants and grafts: Secondary | ICD-10-CM

## 2024-08-18 LAB — BASIC METABOLIC PANEL WITH GFR
Anion gap: 9 (ref 5–15)
BUN: 32 mg/dL — ABNORMAL HIGH (ref 8–23)
CO2: 30 mmol/L (ref 22–32)
Calcium: 9.1 mg/dL (ref 8.9–10.3)
Chloride: 101 mmol/L (ref 98–111)
Creatinine, Ser: 1.28 mg/dL — ABNORMAL HIGH (ref 0.61–1.24)
GFR, Estimated: 59 mL/min — ABNORMAL LOW (ref >60)
Glucose, Bld: 121 mg/dL — ABNORMAL HIGH (ref 70–99)
Potassium: 4.9 mmol/L (ref 3.5–5.1)
Sodium: 140 mmol/L (ref 135–145)

## 2024-08-18 LAB — RESPIRATORY PANEL BY PCR

## 2024-08-18 LAB — CBC WITH DIFFERENTIAL/PLATELET
Abs Immature Granulocytes: 0.06 K/uL (ref 0.00–0.07)
Basophils Absolute: 0 K/uL (ref 0.0–0.1)
Basophils Relative: 0 %
Eosinophils Absolute: 0 K/uL (ref 0.0–0.5)
Eosinophils Relative: 0 %
HCT: 37 % — ABNORMAL LOW (ref 39.0–52.0)
Hemoglobin: 11.2 g/dL — ABNORMAL LOW (ref 13.0–17.0)
Immature Granulocytes: 1 %
Lymphocytes Relative: 6 %
Lymphs Abs: 0.8 K/uL (ref 0.7–4.0)
MCH: 27.8 pg (ref 26.0–34.0)
MCHC: 30.3 g/dL (ref 30.0–36.0)
MCV: 91.8 fL (ref 80.0–100.0)
Monocytes Absolute: 0.7 K/uL (ref 0.1–1.0)
Monocytes Relative: 6 %
Neutro Abs: 10.4 K/uL — ABNORMAL HIGH (ref 1.7–7.7)
Neutrophils Relative %: 87 %
Platelets: 175 K/uL (ref 150–400)
RBC: 4.03 MIL/uL — ABNORMAL LOW (ref 4.22–5.81)
RDW: 14.6 % (ref 11.5–15.5)
WBC: 11.8 K/uL — ABNORMAL HIGH (ref 4.0–10.5)
nRBC: 0 % (ref 0.0–0.2)

## 2024-08-18 LAB — I-STAT CG4 LACTIC ACID, ED: Lactic Acid, Venous: 0.7 mmol/L (ref 0.5–1.9)

## 2024-08-18 LAB — RESP PANEL BY RT-PCR (RSV, FLU A&B, COVID)  RVPGX2
Influenza A by PCR: NEGATIVE
Influenza B by PCR: NEGATIVE
Resp Syncytial Virus by PCR: NEGATIVE
SARS Coronavirus 2 by RT PCR: NEGATIVE

## 2024-08-18 LAB — PRO BRAIN NATRIURETIC PEPTIDE: Pro Brain Natriuretic Peptide: 4032 pg/mL — ABNORMAL HIGH (ref <300.0)

## 2024-08-18 LAB — POC COVID19/FLU A&B COMBO
Covid Antigen, POC: NEGATIVE
Influenza A Antigen, POC: NEGATIVE
Influenza B Antigen, POC: NEGATIVE

## 2024-08-18 MED ORDER — METHYLPREDNISOLONE SODIUM SUCC 125 MG IJ SOLR
125.0000 mg | Freq: Once | INTRAMUSCULAR | Status: AC
  Administered 2024-08-18: 125 mg via INTRAVENOUS
  Filled 2024-08-18: qty 2

## 2024-08-18 MED ORDER — ALBUTEROL SULFATE (2.5 MG/3ML) 0.083% IN NEBU: 2.5000 mg | INHALATION_SOLUTION | RESPIRATORY_TRACT | Status: DC | PRN

## 2024-08-18 MED ORDER — SODIUM CHLORIDE 0.9 % IV SOLN: INTRAVENOUS | Status: DC

## 2024-08-18 MED ORDER — ONDANSETRON HCL 4 MG/2ML IJ SOLN: 4.0000 mg | Freq: Four times a day (QID) | INTRAMUSCULAR | Status: DC | PRN

## 2024-08-18 MED ORDER — SENNOSIDES-DOCUSATE SODIUM 8.6-50 MG PO TABS: 1.0000 | ORAL_TABLET | Freq: Every evening | ORAL | Status: DC | PRN

## 2024-08-18 MED ORDER — IPRATROPIUM-ALBUTEROL 0.5-2.5 (3) MG/3ML IN SOLN
3.0000 mL | Freq: Four times a day (QID) | RESPIRATORY_TRACT | Status: DC
  Administered 2024-08-18: 3 mL via RESPIRATORY_TRACT
  Filled 2024-08-18: qty 3

## 2024-08-18 MED ORDER — IPRATROPIUM-ALBUTEROL 0.5-2.5 (3) MG/3ML IN SOLN
3.0000 mL | Freq: Once | RESPIRATORY_TRACT | Status: AC
  Administered 2024-08-18: 3 mL via RESPIRATORY_TRACT
  Filled 2024-08-18: qty 3

## 2024-08-18 MED ORDER — ONDANSETRON HCL 4 MG PO TABS: 4.0000 mg | ORAL_TABLET | Freq: Four times a day (QID) | ORAL | Status: DC | PRN

## 2024-08-18 MED ORDER — IPRATROPIUM-ALBUTEROL 0.5-2.5 (3) MG/3ML IN SOLN
3.0000 mL | Freq: Four times a day (QID) | RESPIRATORY_TRACT | Status: DC
  Administered 2024-08-19 (×3): 3 mL via RESPIRATORY_TRACT
  Filled 2024-08-18 (×4): qty 3

## 2024-08-18 MED ORDER — METHYLPREDNISOLONE SODIUM SUCC 40 MG IJ SOLR
40.0000 mg | Freq: Two times a day (BID) | INTRAMUSCULAR | Status: DC
  Administered 2024-08-19: 40 mg via INTRAVENOUS
  Filled 2024-08-18: qty 1

## 2024-08-18 MED ORDER — SODIUM CHLORIDE 0.9 % IV SOLN
1.0000 g | Freq: Once | INTRAVENOUS | Status: AC
  Administered 2024-08-18: 1 g via INTRAVENOUS
  Filled 2024-08-18: qty 10

## 2024-08-18 MED ORDER — ACETAMINOPHEN 325 MG PO TABS
650.0000 mg | ORAL_TABLET | Freq: Four times a day (QID) | ORAL | Status: DC | PRN
Start: 2024-08-18 — End: 2024-08-19

## 2024-08-18 MED ORDER — BUDESONIDE 0.5 MG/2ML IN SUSP
0.5000 mg | Freq: Two times a day (BID) | RESPIRATORY_TRACT | Status: DC
  Administered 2024-08-18 – 2024-08-19 (×2): 0.5 mg via RESPIRATORY_TRACT
  Filled 2024-08-18 (×3): qty 2

## 2024-08-18 MED ORDER — SODIUM CHLORIDE 0.9 % IV SOLN
500.0000 mg | Freq: Once | INTRAVENOUS | Status: AC
  Administered 2024-08-18: 500 mg via INTRAVENOUS
  Filled 2024-08-18: qty 5

## 2024-08-18 MED ORDER — APIXABAN 5 MG PO TABS
5.0000 mg | ORAL_TABLET | Freq: Two times a day (BID) | ORAL | Status: DC
  Administered 2024-08-18 – 2024-08-21 (×6): 5 mg via ORAL
  Filled 2024-08-18 (×8): qty 1

## 2024-08-18 MED ORDER — SODIUM CHLORIDE 0.9 % IV SOLN
500.0000 mg | INTRAVENOUS | Status: DC
  Administered 2024-08-19: 500 mg via INTRAVENOUS
  Filled 2024-08-18: qty 5

## 2024-08-18 MED ORDER — SODIUM CHLORIDE 0.9 % IV SOLN
2.0000 g | INTRAVENOUS | Status: DC
  Administered 2024-08-19: 2 g via INTRAVENOUS
  Filled 2024-08-18 (×2): qty 20

## 2024-08-18 MED ORDER — IPRATROPIUM-ALBUTEROL 0.5-2.5 (3) MG/3ML IN SOLN
3.0000 mL | Freq: Once | RESPIRATORY_TRACT | Status: AC
  Administered 2024-08-18: 3 mL via RESPIRATORY_TRACT

## 2024-08-18 MED ORDER — ACETAMINOPHEN 650 MG RE SUPP: 650.0000 mg | Freq: Four times a day (QID) | RECTAL | Status: DC | PRN

## 2024-08-18 NOTE — ED Provider Notes (Signed)
 [place] EMERGENCY DEPARTMENT AT [facility] Provider Note   CSN: [id number] Arrival date & time: 08/18/24  1520     Patient presents with: No chief complaint on file.   [name] is a 73 y.o. male.   Pt is a 73 yo male with pmhx significant for COPD (2L oxygen  at night), CHF, pulmonary HTN, GERD, BPH, Depression, GERD, hx NHL, afib (on Eliquis ), HTN, squamous cell carcinoma of the lung (s/p LUL resection), hypothyroidism, HLD, arthritis, and gout.  Pt has been sob since this am and has been wearing his oxygen  all day.  He went to UC and was diagnosed with pna and sent here as he was hypoxic with 2L oxygen .  Pt has had a low grade fever. He's had a cough.  Pt's O2 sat was 75% on RA upon arrival.  2L brought him up to 88%.  4L brought him up to the mid-90s.       Prior to Admission medications   Medication Sig Start Date End Date Taking? Authorizing Provider  albuterol  (PROVENTIL ) (2.5 MG/3ML) 0.083% nebulizer solution Take 3 mLs (2.5 mg total) by nebulization every 6 (six) hours as needed for wheezing or shortness of breath. 05/24/24   [name], Jacob T, PA-C  albuterol  (VENTOLIN  HFA) 108 (90 Base) MCG/ACT inhaler Inhale 2 puffs into the lungs every 6 (six) hours as needed for wheezing or shortness of breath.    [provider]  allopurinol  (ZYLOPRIM ) 300 MG tablet Take 300 mg by mouth daily. 01/15/17   [provider]  amLODipine  (NORVASC ) 5 MG tablet Take 1 tablet (5 mg total) by mouth daily. 05/24/24   [name], Jacob T, PA-C  apixaban  (ELIQUIS ) 5 MG TABS tablet Take 1 tablet by mouth twice daily 07/17/24   Revankar, Rajan R, MD  azelastine  (ASTELIN ) 0.1 % nasal spray Place 2 sprays into both nostrils 2 (two) times daily. Use in each nostril as directed 07/28/24   Geronimo Amel, MD  betamethasone , augmented, (DIPROLENE ) 0.05 % lotion Apply 1 Application topically daily as needed (dry skin on scalp).    [provider]   budesonide -glycopyrrolate -formoterol  (BREZTRI ) 160-9-4.8 MCG/ACT AERO inhaler Inhale 2 puffs into the lungs 2 (two) times daily. 08/11/24   Geronimo Amel, MD  Buprenorphine HCl-Naloxone HCl 8-2 MG FILM Place 0.33-1 Film under the tongue 2 (two) times daily as needed (back pain). 03/24/23   [provider]  ciclopirox  (PENLAC ) 8 % solution Apply topically at bedtime. Apply a thin coat over nail. Apply daily over previous coat. Remove weekly with nail polish remover. 12/23/23   McCaughan, Dia D, DPM  ezetimibe  (ZETIA ) 10 MG tablet Take 10 mg by mouth daily. 02/09/23   [provider]  Ferrous Sulfate  (IRON ) 325 (65 Fe) MG TABS Take 1 tablet (325 mg total) by mouth daily. 08/02/24   [name], Jacob T, PA-C  finasteride  (PROSCAR ) 5 MG tablet Take 5 mg by mouth daily. 03/13/19   [provider]  furosemide  (LASIX ) 20 MG tablet Take 1 tablet (20 mg total) by mouth daily. Take extra 20 mg for weight gain of 3 pounds a day or 5 pounds in a week. 08/15/24   Revankar, Jennifer SAUNDERS, MD  levothyroxine  (SYNTHROID ) 175 MCG tablet Take 1 tablet (175 mcg total) by mouth daily before breakfast. 08/02/24   [name], Jacob T, PA-C  LINZESS  290 MCG CAPS capsule Take 1 capsule (290 mcg total) by mouth daily before breakfast. 08/02/24   [name], Jacob T, PA-C  Magnesium  Chloride  64 MG TABS Take 64 mg by mouth daily in the afternoon. 05/19/23   Carlin Delon BROCKS, NP  metoprolol  succinate (TOPROL -XL) 25 MG 24 hr tablet Take 1 tablet by mouth once daily 07/26/24   Revankar, Rajan R, MD  OHTUVAYRE  3 MG/2.5ML SUSP Inhale into the lungs as needed. NEB SOLUTION 08/04/24   [provider]  omeprazole  (PRILOSEC) 20 MG capsule Take 1 capsule (20 mg total) by mouth 2 (two) times daily. 08/14/24 11/12/24  [name], Jacob T, PA-C  OXYGEN  Inhale 2 L/min into the lungs See admin instructions. Inhale 2 L/min into the lungs at bedtime and as needed for shortness of breath during the daytime    [provider]  Pitavastatin  Calcium  1 MG TABS Take 1 tablet (1 mg total) by mouth daily. 08/15/24   Revankar, Jennifer SAUNDERS, MD  sodium chloride  (OCEAN) 0.65 % SOLN nasal spray Place 1 spray into both nostrils as needed for congestion.    [provider]  sodium chloride  HYPERTONIC 3 % nebulizer solution Take by nebulization 2 (two) times daily. 3 mL 3% saline nebulizer twice daily 07/28/24   Geronimo Amel, MD  tamsulosin  (FLOMAX ) 0.4 MG CAPS capsule Take 1 capsule (0.4 mg total) by mouth daily. 08/14/24   [name], Jacob T, PA-C  zolpidem  (AMBIEN ) 10 MG tablet Take 1 tablet (10 mg total) by mouth at bedtime as needed for sleep. 05/02/24   [name], Lang DASEN, PA-C    Allergies: Hydrochlorothiazide , Lipitor [atorvastatin ], Omnipaque  [iohexol ], and Tape    Review of Systems  Respiratory:  Positive for cough and shortness of breath.   All other systems reviewed and are negative.   Updated Vital Signs BP 123/65 (BP Location: Right Arm)   Pulse 67   Temp 98.2 F (36.8 C) (Oral)   Resp 16   SpO2 96%   Physical Exam Vitals and nursing note reviewed.  Constitutional:      Appearance: Normal appearance.  HENT:     Head: Normocephalic and atraumatic.     Right Ear: External ear normal.     Left Ear: External ear normal.     Nose: Nose normal.     Mouth/Throat:     Mouth: Mucous membranes are moist.     Pharynx: Oropharynx is clear.  Eyes:     Extraocular Movements: Extraocular movements intact.     Conjunctiva/sclera: Conjunctivae normal.     Pupils: Pupils are equal, round, and reactive to light.  Cardiovascular:     Rate and Rhythm: Normal rate. Rhythm irregular.     Pulses: Normal pulses.     Heart sounds: Normal heart sounds.  Pulmonary:     Effort: Tachypnea present.     Breath sounds: Wheezing and rhonchi present.  Abdominal:     General: Abdomen is flat. Bowel sounds are normal.     Palpations: Abdomen is soft.  Musculoskeletal:        General: Normal range of  motion.     Cervical back: Normal range of motion and neck supple.  Skin:    General: Skin is warm.     Capillary Refill: Capillary refill takes less than 2 seconds.  Neurological:     General: No focal deficit present.     Mental Status: He is alert and oriented to person, place, and time.  Psychiatric:        Mood and Affect: Mood normal.        Behavior: Behavior normal.     (all labs ordered are  listed, but only abnormal results are displayed) Labs Reviewed  BASIC METABOLIC PANEL WITH GFR - Abnormal; Notable for the following components:      Result Value   Glucose, Bld 121 (*)    BUN 32 (*)    Creatinine, Ser 1.28 (*)    GFR, Estimated 59 (*)    All other components within normal limits  PRO BRAIN NATRIURETIC PEPTIDE - Abnormal; Notable for the following components:   Pro Brain Natriuretic Peptide 4,032.0 (*)    All other components within normal limits  CBC WITH DIFFERENTIAL/PLATELET - Abnormal; Notable for the following components:   WBC 11.8 (*)    RBC 4.03 (*)    Hemoglobin 11.2 (*)    HCT 37.0 (*)    Neutro Abs 10.4 (*)    All other components within normal limits  RESP PANEL BY RT-PCR (RSV, FLU A&B, COVID)  [id number]  CULTURE, BLOOD (ROUTINE X 2)  CULTURE, BLOOD (ROUTINE X 2)  I-STAT CG4 LACTIC ACID, ED  I-STAT CG4 LACTIC ACID, ED    EKG: EKG Interpretation Date/Time:  Friday August 18 2024 16:04:07 EST Ventricular Rate:  76 PR Interval:    QRS Duration:  105 QT Interval:  386 QTC Calculation: 414 R Axis:   131  Text Interpretation: Atrial fibrillation Right axis deviation Consider anterior infarct No significant change since last tracing Confirmed by Dean Clarity [phone]) on 08/18/2024 4:43:37 PM  Radiology: [facility] Chest 2 View Result Date: 08/18/2024 EXAM: 2 VIEW(S) XRAY OF THE CHEST 08/18/2024 01:50:00 PM COMPARISON: 04/17/2024 CLINICAL HISTORY: cough FINDINGS: LINES, TUBES AND DEVICES: Right chest port in place. LUNGS AND PLEURA: Stable loculated  fluid collection and consolidation at the right lung apex. New airspace disease in the left lower lobe and lingula concerning for pneumonia. Increased fluid along the right fissure versus atelectasis. No pleural effusion. No pneumothorax. HEART AND MEDIASTINUM: No acute abnormality of the cardiac and mediastinal silhouettes. BONES AND SOFT TISSUES: No acute osseous abnormality. IMPRESSION: 1. New airspace disease in the left lower lobe and lingula, concerning for pneumonia. 2. Stable loculated fluid collection and consolidation at the right lung apex. 3. Increased fluid along the right fissure versus atelectasis. Electronically signed by: Norleen Boxer MD 08/18/2024 02:22 PM EST RP Workstation: [id number]     Procedures   Medications Ordered in the ED  azithromycin  (ZITHROMAX ) 500 mg in sodium chloride  0.9 % 250 mL IVPB (500 mg Intravenous New Bag/Given 08/18/24 1719)  cefTRIAXone  (ROCEPHIN ) 1 g in sodium chloride  0.9 % 100 mL IVPB (0 g Intravenous Stopped 08/18/24 1711)  ipratropium-albuterol  (DUONEB) 0.5-2.5 (3) MG/3ML nebulizer solution 3 mL (3 mLs Nebulization Given 08/18/24 1720)  methylPREDNISolone  sodium succinate  (SOLU-MEDROL ) 125 mg/2 mL injection 125 mg (125 mg Intravenous Given 08/18/24 1720)                                    Medical Decision Making Amount and/or Complexity of Data Reviewed Labs: ordered.  Risk Prescription drug management. Decision regarding hospitalization.   This patient presents to the ED for concern of sob, this involves an extensive number of treatment options, and is a complaint that carries with it a high risk of complications and morbidity.  The differential diagnosis includes COPD exac, pna, bronchitis, covid/flu/rsv   Co morbidities that complicate the patient evaluation  COPD (2L oxygen  at night), CHF, pulmonary HTN, GERD, BPH, Depression, GERD, hx NHL, afib (on Eliquis ), HTN, squamous cell  carcinoma of the lung (s/p LUL resection),  hypothyroidism, HLD, arthritis, and gout   Additional history obtained:  Additional history obtained from epic chart review External records from outside source obtained and reviewed including wife   Lab Tests:  I Ordered, and personally interpreted labs.  The pertinent results include:  lactic nl at 0.7; cbc with wbc sl elevated at 11.8, hgb 11.2 (hgb 10.3 on 9/25); bun 32 and 1.28 (bun 24 and cr 1.01 in Sept); covid/flu/rsv neg   Imaging Studies ordered:  I ordered imaging studies including cxr  I independently visualized and interpreted imaging which showed New airspace disease in the left lower lobe and lingula, concerning for pneumonia. 2. Stable loculated fluid collection and consolidation at the right lung apex. 3. Increased fluid along the right fissure versus atelectasis. I agree with the radiologist interpretation   Cardiac Monitoring:  The patient was maintained on a cardiac monitor.  I personally viewed and interpreted the cardiac monitored which showed an underlying rhythm of: nsr   Medicines ordered and prescription drug management:  I ordered medication including rocephin /zithromax   for sx  Reevaluation of the patient after these medicines showed that the patient improved I have reviewed the patients home medicines and have made adjustments as needed   Test Considered:  ct   Critical Interventions:  oxygen    Consultations Obtained:  I requested consultation with the hospitalist (Dr. Cheryle),  and discussed lab and imaging findings as well as pertinent plan - he will admit   Problem List / ED Course:  COPD with LLL pna and increased oxygen  requirement:  pt is on 4L now and is breathing better.  He was given steroids and nebs and iv abx.  He will need admission.   Reevaluation:  After the interventions noted above, I reevaluated the patient and found that they have :improved   Social Determinants of Health:  Lives at  home   Dispostion:  After consideration of the diagnostic results and the patients response to treatment, I feel that the patent would benefit from admission.  CRITICAL CARE Performed by: Mliss Boyers   Total critical care time: 30 minutes  Critical care time was exclusive of separately billable procedures and treating other patients.  Critical care was necessary to treat or prevent imminent or life-threatening deterioration.  Critical care was time spent personally by me on the following activities: development of treatment plan with patient and/or surrogate as well as nursing, discussions with consultants, evaluation of patient's response to treatment, examination of patient, obtaining history from patient or surrogate, ordering and performing treatments and interventions, ordering and review of laboratory studies, ordering and review of radiographic studies, pulse oximetry and re-evaluation of patient's condition.        Final diagnoses:  Community acquired pneumonia of left lower lobe of lung  COPD exacerbation (HCC)  Acute respiratory failure with hypoxia [facility])    ED Discharge Orders     None          Boyers Mliss, MD 08/18/24 1728

## 2024-08-18 NOTE — ED Notes (Signed)
 Pt to xray via wheelchair at this time

## 2024-08-18 NOTE — ED Triage Notes (Signed)
 Pt st's he started feeling congested during the night last night  St's he also had an elevated temp     Pt also has productive cough.

## 2024-08-18 NOTE — ED Notes (Signed)
 Pt placed on 2L O2 via nasal cannula. Wears O2 regularly

## 2024-08-18 NOTE — ED Notes (Signed)
 Pt st's he is on portable oxygen  and it is getting low.   This RN took a oxygen  tank to lobby for patient while he waits to be seen   Pt placed on 2LPM via [place]   02 Sats 97% at this time.  Pt advised to tell registration clerk if he starts to feel worse.  Pt voices understanding

## 2024-08-18 NOTE — ED Triage Notes (Signed)
 Pt arriving POV from urgent care after diagnosis of pneumonia. Pt was wearing O2 only at night but has recently had to start wearing it regularly.

## 2024-08-18 NOTE — H&P (Signed)
 History and Physical    ALA CAPRI FMW:[id number] DOB: [birth year] DOA: 08/18/2024  PCP: Iven Lang DASEN, PA-C   Patient coming from: Home  I have personally briefly reviewed patient's old medical records in [facility]  Chief Complaint: Shortness of breath  HPI: [name] is a 73 y.o. male with medical history significant of COPD, chronic respiratory failure with hypoxia using 2 L oxygen  by nasal cannula at night, chronic diastolic CHF, pulmonary hypertension, hypertension, hyperlipidemia GERD, BPH, depression, non-small cell cancer of right upper lobe status post SBRT, non-Hodgkin lymphoma and paroxysmal A-fib on Eliquis  presented with worsening shortness of breath that got worse today. He normally wears oxygen  at night but has been wearing oxygen  during the daytime as well today.  He has been having intermittent cough with some productive sputum.  Denies fever, nausea, vomiting, chest pain, worsening abdominal pain, diarrhea, dysuria, loss of consciousness or seizures.  He went to urgent care where he was found to have pneumonia and was sent to the ED.  ED Course: Oxygen  saturation was 75% on room air on arrival.  4 L nasal cannula oxygen  brought his saturations to the 90s.  Creatinine 1.28, proBNP 4032, WBC of 11.8.  RSV/influenza/COVID PCR negative.  Chest x-ray showed new airspace disease in the left lower lobe and lingula concerning for pneumonia.  He was started on IV antibiotics and Solu-Medrol .  Hospitalist service was called to evaluate the patient.  Review of Systems: As per HPI otherwise all other systems were reviewed and are negative.   Past Medical History:  Diagnosis Date   Acute diastolic CHF (congestive heart failure) (HCC) 05/07/2023   Acute on chronic diastolic CHF (congestive heart failure) (HCC) 05/06/2023   Acute on chronic respiratory failure with hypoxia (HCC) 05/06/2023   Aortic atherosclerosis 01/26/2023   Atrial fibrillation (HCC) 10/26/2011    BPH (benign prostatic hyperplasia) 2011   Carpal tunnel syndrome, right upper limb 11/03/2022   Chronic constipation 02/21/2024   Chronic diastolic CHF (congestive heart failure) (HCC) 05/07/2023   Chronic respiratory failure with hypoxia (HCC) 08/27/2015   Congestive heart failure (HCC)    COPD (chronic obstructive pulmonary disease) (HCC)    COPD exacerbation (HCC) 04/17/2024   COPD with acute exacerbation (HCC) 05/26/2023   COPD, severe (HCC) 11/27/2015   12/18/2015-pulmonary function test- FVC 2.75 (55% predicted), postbronchodilator ratio 56, postbronchodilator FEV1 1.66 (45% predicted), no significant bronchodilator response, mid flow reversibility, DLCO 33  >>>Severe obstructive airways disease, Severe diffusion defect        Coronary artery calcification 01/26/2023   Cubital tunnel syndrome on right 07/09/2023   Depression    DOE (dyspnea on exertion) 04/07/2023   Essential hypertension 06/05/2008   Qualifier: Diagnosis of   By: [name]), [name]       GERD (gastroesophageal reflux disease)    Hyperkalemia 03/31/2024   Hypothyroidism 03/30/2012   Idiopathic chronic gout of multiple sites without tophus 01/24/2024   Lesion of ulnar nerve, right upper limb 11/03/2022   Lymphadenopathy syndrome 09/12/2015   Mediastinal adenopathy 09/12/2015   Multifocal pneumonia 03/31/2024   NHL (non-Hodgkin's lymphoma) (HCC) 09/25/2015   Numbness of right hand 06/28/2022   Obesity    Other dysphagia 11/11/2010   Qualifier: Diagnosis of   By: [name], [name]       Polycythemia 01/26/2023   Port catheter in place 08/20/2016   Primary osteoarthritis of left distal radioulnar joint 02/21/2024   Primary osteoarthritis of right distal radioulnar joint 01/28/2024   Pulmonary  hypertension (HCC)    Pure hypercholesterolemia 12/03/2011   Sepsis due to pneumonia (HCC) 03/30/2024   Squamous cell carcinoma lung (HCC) 09/26/2010   S/p LUL resection Dr Brantley dec 2011      Statin myopathy  02/21/2024   Status post bronchoscopy with biopsy     Past Surgical History:  Procedure Laterality Date   APPENDECTOMY     BACK SURGERY     CARDIOVERSION  11/27/2011   Procedure: CARDIOVERSION;  Surgeon: Aleene JINNY Passe, MD;  Location: [facility] ENDOSCOPY;  Service: Cardiovascular;  Laterality: N/A;   COLONOSCOPY     COLONOSCOPY WITH PROPOFOL  N/A 09/07/2023   Procedure: COLONOSCOPY WITH PROPOFOL ;  Surgeon: Leigh Elspeth SQUIBB, MD;  Location: [facility] ENDOSCOPY;  Service: Gastroenterology;  Laterality: N/A;   FUDUCIAL PLACEMENT Right 10/13/2019   Procedure: Placement Of Fuducial;  Surgeon: Shelah Lamar RAMAN, MD;  Location: [facility] OR;  Service: Thoracic;  Laterality: Right;  Right Upper Lobe    LEFT HEART CATH AND CORONARY ANGIOGRAPHY N/A 04/12/2023   Procedure: LEFT HEART CATH AND CORONARY ANGIOGRAPHY;  Surgeon: Anner Alm ORN, MD;  Location: [facility] INVASIVE CV LAB;  Service: Cardiovascular;  Laterality: N/A;   LUNG CANCER SURGERY  2011   POLYPECTOMY  09/07/2023   Procedure: POLYPECTOMY;  Surgeon: Leigh Elspeth SQUIBB, MD;  Location: [facility]COPY;  Service: Gastroenterology;;   UPPER GI ENDOSCOPY     VIDEO BRONCHOSCOPY WITH ENDOBRONCHIAL NAVIGATION N/A 10/13/2019   Procedure: VIDEO BRONCHOSCOPY WITH ENDOBRONCHIAL NAVIGATION;  Surgeon: Shelah Lamar RAMAN, MD;  Location: [facility] OR;  Service: Thoracic;  Laterality: N/A;   VIDEO BRONCHOSCOPY WITH ENDOBRONCHIAL ULTRASOUND N/A 10/13/2019   Procedure: VIDEO BRONCHOSCOPY WITH ENDOBRONCHIAL ULTRASOUND;  Surgeon: Shelah Lamar RAMAN, MD;  Location: [facility] OR;  Service: Thoracic;  Laterality: N/A;     reports that he quit smoking about 22 years ago. His smoking use included cigarettes. He started smoking about 65 years ago. He has a 129 pack-year smoking history. He has been exposed to tobacco smoke. He has never used smokeless tobacco. He reports that he does not drink alcohol  and does not use drugs.  Allergies  Allergen Reactions   Hydrochlorothiazide  Other (See Comments)    Dried  out the patient too much   Lipitor [Atorvastatin ] Other (See Comments)    Myalgias  Weakness  Fatigue    Omnipaque  [Iohexol ] Itching and Other (See Comments)    When asked about contrast media today, pt stated that last time he had the contrast that he was red all over and itchy from head to toe.    Tape Other (See Comments)    Band-Aids irritate the skin    Family History  Problem Relation Age of Onset   Heart disease Father    Cancer Neg Hx    Diabetes Neg Hx    Stroke Neg Hx    Hypertension Neg Hx    Kidney disease Neg Hx     Prior to Admission medications   Medication Sig Start Date End Date Taking? Authorizing Provider  albuterol  (PROVENTIL ) (2.5 MG/3ML) 0.083% nebulizer solution Take 3 mLs (2.5 mg total) by nebulization every 6 (six) hours as needed for wheezing or shortness of breath. 05/24/24   [name], Jacob T, PA-C  albuterol  (VENTOLIN  HFA) 108 (90 Base) MCG/ACT inhaler Inhale 2 puffs into the lungs every 6 (six) hours as needed for wheezing or shortness of breath.    [provider]  allopurinol  (ZYLOPRIM ) 300 MG tablet Take 300 mg by mouth daily. 01/15/17   [provider]  amLODipine  (NORVASC ) 5 MG tablet Take 1 tablet (5 mg total) by mouth daily. 05/24/24   [name], Jacob T, PA-C  apixaban  (ELIQUIS ) 5 MG TABS tablet Take 1 tablet by mouth twice daily 07/17/24   Revankar, Rajan R, MD  azelastine  (ASTELIN ) 0.1 % nasal spray Place 2 sprays into both nostrils 2 (two) times daily. Use in each nostril as directed 07/28/24   Geronimo Amel, MD  betamethasone , augmented, (DIPROLENE ) 0.05 % lotion Apply 1 Application topically daily as needed (dry skin on scalp).    [provider]  budesonide -glycopyrrolate -formoterol  (BREZTRI ) 160-9-4.8 MCG/ACT AERO inhaler Inhale 2 puffs into the lungs 2 (two) times daily. 08/11/24   Geronimo Amel, MD  Buprenorphine HCl-Naloxone HCl 8-2 MG FILM Place 0.33-1 Film under the tongue 2 (two) times daily as needed (back  pain). 03/24/23   [provider]  ciclopirox  (PENLAC ) 8 % solution Apply topically at bedtime. Apply a thin coat over nail. Apply daily over previous coat. Remove weekly with nail polish remover. 12/23/23   McCaughan, Dia D, DPM  ezetimibe  (ZETIA ) 10 MG tablet Take 10 mg by mouth daily. 02/09/23   [provider]  Ferrous Sulfate  (IRON ) 325 (65 Fe) MG TABS Take 1 tablet (325 mg total) by mouth daily. 08/02/24   [name], Jacob T, PA-C  finasteride  (PROSCAR ) 5 MG tablet Take 5 mg by mouth daily. 03/13/19   [provider]  furosemide  (LASIX ) 20 MG tablet Take 1 tablet (20 mg total) by mouth daily. Take extra 20 mg for weight gain of 3 pounds a day or 5 pounds in a week. 08/15/24   Revankar, Jennifer SAUNDERS, MD  levothyroxine  (SYNTHROID ) 175 MCG tablet Take 1 tablet (175 mcg total) by mouth daily before breakfast. 08/02/24   [name], Jacob T, PA-C  LINZESS  290 MCG CAPS capsule Take 1 capsule (290 mcg total) by mouth daily before breakfast. 08/02/24   [name], Jacob T, PA-C  Magnesium  Chloride 64 MG TABS Take 64 mg by mouth daily in the afternoon. 05/19/23   Carlin Delon BROCKS, NP  metoprolol  succinate (TOPROL -XL) 25 MG 24 hr tablet Take 1 tablet by mouth once daily 07/26/24   Revankar, Rajan R, MD  OHTUVAYRE  3 MG/2.5ML SUSP Inhale into the lungs as needed. NEB SOLUTION 08/04/24   [provider]  omeprazole  (PRILOSEC) 20 MG capsule Take 1 capsule (20 mg total) by mouth 2 (two) times daily. 08/14/24 11/12/24  [name], Jacob T, PA-C  OXYGEN  Inhale 2 L/min into the lungs See admin instructions. Inhale 2 L/min into the lungs at bedtime and as needed for shortness of breath during the daytime    [provider]  Pitavastatin  Calcium  1 MG TABS Take 1 tablet (1 mg total) by mouth daily. 08/15/24   Revankar, Jennifer SAUNDERS, MD  sodium chloride  (OCEAN) 0.65 % SOLN nasal spray Place 1 spray into both nostrils as needed for congestion.    [provider]  sodium chloride  HYPERTONIC  3 % nebulizer solution Take by nebulization 2 (two) times daily. 3 mL 3% saline nebulizer twice daily 07/28/24   Geronimo Amel, MD  tamsulosin  (FLOMAX ) 0.4 MG CAPS capsule Take 1 capsule (0.4 mg total) by mouth daily. 08/14/24   [name], Jacob T, PA-C  zolpidem  (AMBIEN ) 10 MG tablet Take 1 tablet (10 mg total) by mouth at bedtime as needed for sleep. 05/02/24   [name], [name]    Physical Exam: Vitals:   08/18/24 1534 08/18/24 1703  BP: 123/63 123/65  Pulse:  78 67  Resp: (!) 22 16  Temp: 97.7 F (36.5 C) 98.2 F (36.8 C)  TempSrc: Oral Oral  SpO2: (!) 75% 96%    Constitutional: NAD, calm, comfortable.  Chronically ill and deconditioned looking.  Currently on 4 L oxygen  via nasal cannula. Vitals:   08/18/24 1534 08/18/24 1703  BP: 123/63 123/65  Pulse: 78 67  Resp: (!) 22 16  Temp: 97.7 F (36.5 C) 98.2 F (36.8 C)  TempSrc: Oral Oral  SpO2: (!) 75% 96%   Eyes: PERRL, lids and conjunctivae normal ENMT: Mucous membranes are moist. Posterior pharynx clear of any exudate or lesions. Neck: normal, supple, no masses, no thyromegaly Respiratory: bilateral decreased breath sounds at bases, some scattered crackles and wheezing.  Mildly tachypneic.  No accessory muscle use.  Cardiovascular: S1 S2 positive, rate controlled.  Trace lower extremity edema.  2+ pedal pulses.  Abdomen: no tenderness, no masses palpated. No hepatosplenomegaly. Bowel sounds positive.  Musculoskeletal: no clubbing / cyanosis. No joint deformity upper and lower extremities.  Skin: no rashes, lesions, ulcers. No induration Neurologic: CN 2-12 grossly intact. Moving extremities. No focal neurologic deficits.  Psychiatric: Flat affect.  Not agitated.  Labs on Admission: I have personally reviewed following labs and imaging studies  CBC: Recent Labs  Lab 08/18/24 1635  WBC 11.8*  NEUTROABS 10.4*  HGB 11.2*  HCT 37.0*  MCV 91.8  PLT 175   Basic Metabolic Panel: Recent Labs  Lab  08/18/24 1635  NA 140  K 4.9  CL 101  CO2 30  GLUCOSE 121*  BUN 32*  CREATININE 1.28*  CALCIUM  9.1   GFR: Estimated Creatinine Clearance: 56.4 mL/min (A) (by C-G formula based on SCr of 1.28 mg/dL (H)). Liver Function Tests: No results for input(s): AST, ALT, ALKPHOS, BILITOT, PROT, ALBUMIN in the last 168 hours. No results for input(s): LIPASE, AMYLASE in the last 168 hours. No results for input(s): AMMONIA in the last 168 hours. Coagulation Profile: No results for input(s): INR, PROTIME in the last 168 hours. Cardiac Enzymes: No results for input(s): CKTOTAL, CKMB, CKMBINDEX, TROPONINI in the last 168 hours. BNP (last 3 results) Recent Labs    08/18/24 1635  PROBNP 4,032.0*   HbA1C: No results for input(s): HGBA1C in the last 72 hours. CBG: No results for input(s): GLUCAP in the last 168 hours. Lipid Profile: No results for input(s): CHOL, HDL, LDLCALC, TRIG, CHOLHDL, LDLDIRECT in the last 72 hours. Thyroid  Function Tests: No results for input(s): TSH, T4TOTAL, FREET4, T3FREE, THYROIDAB in the last 72 hours. Anemia Panel: No results for input(s): VITAMINB12, FOLATE, FERRITIN, TIBC, IRON , RETICCTPCT in the last 72 hours. Urine analysis:    Component Value Date/Time   COLORURINE YELLOW 04/17/2024 1326   APPEARANCEUR CLEAR 04/17/2024 1326   LABSPEC 1.012 04/17/2024 1326   PHURINE 5.0 04/17/2024 1326   GLUCOSEU NEGATIVE 04/17/2024 1326   GLUCOSEU NEGATIVE 12/03/2011 1024   HGBUR SMALL (A) 04/17/2024 1326   BILIRUBINUR negative 08/14/2024 1130   KETONESUR negative 08/14/2024 1130   KETONESUR NEGATIVE 04/17/2024 1326   PROTEINUR NEGATIVE 04/17/2024 1326   UROBILINOGEN 0.2 08/14/2024 1130   UROBILINOGEN 0.2 12/03/2011 1024   NITRITE Negative 08/14/2024 1130   NITRITE NEGATIVE 04/17/2024 1326   LEUKOCYTESUR Negative 08/14/2024 1130   LEUKOCYTESUR NEGATIVE 04/17/2024 1326    Radiological Exams on  Admission: DG Chest 2 View Result Date: 08/18/2024 EXAM: 2 VIEW(S) XRAY OF THE CHEST 08/18/2024 01:50:00 PM COMPARISON: 04/17/2024 CLINICAL HISTORY: cough FINDINGS: LINES, TUBES AND DEVICES: Right chest  port in place. LUNGS AND PLEURA: Stable loculated fluid collection and consolidation at the right lung apex. New airspace disease in the left lower lobe and lingula concerning for pneumonia. Increased fluid along the right fissure versus atelectasis. No pleural effusion. No pneumothorax. HEART AND MEDIASTINUM: No acute abnormality of the cardiac and mediastinal silhouettes. BONES AND SOFT TISSUES: No acute osseous abnormality. IMPRESSION: 1. New airspace disease in the left lower lobe and lingula, concerning for pneumonia. 2. Stable loculated fluid collection and consolidation at the right lung apex. 3. Increased fluid along the right fissure versus atelectasis. Electronically signed by: Norleen Boxer MD 08/18/2024 02:22 PM EST RP Workstation: [id number]     Assessment/Plan  Community-acquired bacterial left sided pneumonia: Bacteria unspecified Acute on chronic hypoxic respiratory failure -Normally wears 2 L oxygen  via nasal cannula at night only.  Currently requiring 4 L oxygen .  Imaging as above.  COVID/influenza/RSV PCR negative. - Continue Rocephin  and Zithromax .  Wean oxygen  off as able.  Follow cultures - SLP eval  COPD exacerbation - Continue Solu-Medrol  and nebs.  Outpatient follow-up with pulmonary  Paroxysmal A-fib - Currently rate controlled.  Resume Eliquis .  Resume beta-blocker once doses verified  Chronic diastolic heart failure - Currently compensated.  Strict input and output.  Daily weights.  Hold off on diuretics for now as patient has mild AKI.  AKI - Gentle hydration.  Repeat a.m. labs  Leukocytosis -Mild.  Monitor  Anemia of chronic disease - From chronic illnesses.  Hemoglobin stable  Hypothyroidism Hyperlipidemia BPH - Resume home regimen once  verified  Physical deconditioning - PT eval  DVT prophylaxis: Eliquis  Code Status: Full Family Communication: None at bedside Disposition Plan: Home in 1 to 2 days once clinically improved Consults called: None Admission status: Telemetry/observation  Severity of Illness: The appropriate patient status for this patient is OBSERVATION. Observation status is judged to be reasonable and necessary in order to provide the required intensity of service to ensure the patient's safety. The patient's presenting symptoms, physical exam findings, and initial radiographic and laboratory data in the context of their medical condition is felt to place them at decreased risk for further clinical deterioration. Furthermore, it is anticipated that the patient will be medically stable for discharge from the hospital within 2 midnights of admission.     Sophie Mao MD [facility]  08/18/2024, 5:36 PM

## 2024-08-18 NOTE — ED Provider Notes (Signed)
 [facility]    CSN: [id number] Arrival date & time: 08/18/24  1109      History   Chief Complaint Chief Complaint  Patient presents with   Fever    HPI [name] is a 73 y.o. male.   73 y.o. male, with a history of COPD on 2 L/min of oxygen  at home chronically, diastolic heart failure with last echo from May 2024 showed EF 60 to 65%, non-small cell lung cancer of right upper lobe, status post SBRT, non-Hodgkin lymphoma, hypothyroidism, atrial fibrillation on Eliquis , hypertension, hyperlipidemia who was recently discharged from the hospital on 04/05/2024 after treated for pneumonia.  Patient says that he was prescribed 2 diuretics both Lasix  and HCTZ..  Patient was not discharged on HCTZ on 04/05/2024, it was started on 04/12/2024.  As per pharmacy he was started on HCTZ by his PCP, Dr. Norleen Fearing in [place].  He was readmitted to the hospital [facility]) on 08/18/2024 with a final diagnosis of a COPD exacerbation, not pneumonia.  He reports that on 08/17/2024, he developed some cough and congestion.  He does have COPD.  He was much worse today with significant shortness of breath.  He normally uses oxygen  at home at 2 L/min but he is having hypoxia even on 2 L/min and feeling short of breath and having difficulty breathing.  In addition to home oxygen , he takes the Breztri  inhaler twice daily and uses an albuterol  inhaler for wheezing or shortness of breath.   Fever Associated symptoms: cough   Associated symptoms: no chest pain, no chills, no diarrhea, no dysuria, no ear pain, no nausea, no rash, no sore throat and no vomiting     Past Medical History:  Diagnosis Date   Acute diastolic CHF (congestive heart failure) (HCC) 05/07/2023   Acute on chronic diastolic CHF (congestive heart failure) (HCC) 05/06/2023   Acute on chronic respiratory failure with hypoxia (HCC) 05/06/2023   Aortic atherosclerosis 01/26/2023   Atrial fibrillation (HCC) 10/26/2011   BPH (benign  prostatic hyperplasia) 2011   Carpal tunnel syndrome, right upper limb 11/03/2022   Chronic constipation 02/21/2024   Chronic diastolic CHF (congestive heart failure) (HCC) 05/07/2023   Chronic respiratory failure with hypoxia (HCC) 08/27/2015   Congestive heart failure (HCC)    COPD (chronic obstructive pulmonary disease) (HCC)    COPD exacerbation (HCC) 04/17/2024   COPD with acute exacerbation (HCC) 05/26/2023   COPD, severe (HCC) 11/27/2015   12/18/2015-pulmonary function test- FVC 2.75 (55% predicted), postbronchodilator ratio 56, postbronchodilator FEV1 1.66 (45% predicted), no significant bronchodilator response, mid flow reversibility, DLCO 33  >>>Severe obstructive airways disease, Severe diffusion defect        Coronary artery calcification 01/26/2023   Cubital tunnel syndrome on right 07/09/2023   Depression    DOE (dyspnea on exertion) 04/07/2023   Essential hypertension 06/05/2008   Qualifier: Diagnosis of   By: [name]), [name]       GERD (gastroesophageal reflux disease)    Hyperkalemia 03/31/2024   Hypothyroidism 03/30/2012   Idiopathic chronic gout of multiple sites without tophus 01/24/2024   Lesion of ulnar nerve, right upper limb 11/03/2022   Lymphadenopathy syndrome 09/12/2015   Mediastinal adenopathy 09/12/2015   Multifocal pneumonia 03/31/2024   NHL (non-Hodgkin's lymphoma) (HCC) 09/25/2015   Numbness of right hand 06/28/2022   Obesity    Other dysphagia 11/11/2010   Qualifier: Diagnosis of   By: [name], [name]       Polycythemia 01/26/2023   Port catheter  in place 08/20/2016   Primary osteoarthritis of left distal radioulnar joint 02/21/2024   Primary osteoarthritis of right distal radioulnar joint 01/28/2024   Pulmonary hypertension (HCC)    Pure hypercholesterolemia 12/03/2011   Sepsis due to pneumonia (HCC) 03/30/2024   Squamous cell carcinoma lung (HCC) 09/26/2010   S/p LUL resection Dr Brantley dec 2011      Statin myopathy 02/21/2024    Status post bronchoscopy with biopsy     Patient Active Problem List   Diagnosis Date Noted   COPD exacerbation (HCC) 04/17/2024   Hyperkalemia 03/31/2024   Multifocal pneumonia 03/31/2024   Sepsis due to pneumonia (HCC) 03/30/2024   Statin myopathy 02/21/2024   Chronic constipation 02/21/2024   Primary osteoarthritis of left distal radioulnar joint 02/21/2024   Primary osteoarthritis of right distal radioulnar joint 01/28/2024   Idiopathic chronic gout of multiple sites without tophus 01/24/2024   Cubital tunnel syndrome on right 07/09/2023   COPD with acute exacerbation (HCC) 05/26/2023   Chronic diastolic CHF (congestive heart failure) (HCC) 05/07/2023   Acute diastolic CHF (congestive heart failure) (HCC) 05/07/2023   Acute on chronic diastolic CHF (congestive heart failure) (HCC) 05/06/2023   Acute on chronic respiratory failure with hypoxia (HCC) 05/06/2023   DOE (dyspnea on exertion) 04/07/2023   Congestive heart failure (HCC) 01/26/2023   GERD (gastroesophageal reflux disease) 01/26/2023   Obesity 01/26/2023   Polycythemia 01/26/2023   Pulmonary hypertension (HCC) 01/26/2023   Aortic atherosclerosis 01/26/2023   Coronary artery calcification 01/26/2023   Carpal tunnel syndrome, right upper limb 11/03/2022   Lesion of ulnar nerve, right upper limb 11/03/2022   Numbness of right hand 06/28/2022   Status post bronchoscopy with biopsy    Port catheter in place 08/20/2016   COPD, severe (HCC) 11/27/2015   NHL (non-Hodgkin's lymphoma) (HCC) 09/25/2015   Lymphadenopathy syndrome 09/12/2015   Mediastinal adenopathy 09/12/2015   Chronic respiratory failure with hypoxia (HCC) 08/27/2015   Hypothyroidism 03/30/2012   Depression 12/03/2011   Pure hypercholesterolemia 12/03/2011   Atrial fibrillation (HCC) 10/26/2011   Other dysphagia 11/11/2010   Squamous cell carcinoma lung (HCC) 09/26/2010   BPH (benign prostatic hyperplasia) 2011   ERECTILE DYSFUNCTION 09/24/2008    Essential hypertension 06/05/2008   COPD (chronic obstructive pulmonary disease) (HCC) 06/05/2008    Past Surgical History:  Procedure Laterality Date   APPENDECTOMY     BACK SURGERY     CARDIOVERSION  11/27/2011   Procedure: CARDIOVERSION;  Surgeon: Aleene JINNY Passe, MD;  Location: [facility] ENDOSCOPY;  Service: Cardiovascular;  Laterality: N/A;   COLONOSCOPY     COLONOSCOPY WITH PROPOFOL  N/A 09/07/2023   Procedure: COLONOSCOPY WITH PROPOFOL ;  Surgeon: Leigh Elspeth SQUIBB, MD;  Location: [facility];  Service: Gastroenterology;  Laterality: N/A;   FUDUCIAL PLACEMENT Right 10/13/2019   Procedure: Placement Of Fuducial;  Surgeon: Shelah Lamar RAMAN, MD;  Location: [facility] OR;  Service: Thoracic;  Laterality: Right;  Right Upper Lobe    LEFT HEART CATH AND CORONARY ANGIOGRAPHY N/A 04/12/2023   Procedure: LEFT HEART CATH AND CORONARY ANGIOGRAPHY;  Surgeon: Anner Alm ORN, MD;  Location: [facility] INVASIVE CV LAB;  Service: Cardiovascular;  Laterality: N/A;   LUNG CANCER SURGERY  2011   POLYPECTOMY  09/07/2023   Procedure: POLYPECTOMY;  Surgeon: Leigh Elspeth SQUIBB, MD;  Location: [facility];  Service: Gastroenterology;;   UPPER GI ENDOSCOPY     VIDEO BRONCHOSCOPY WITH ENDOBRONCHIAL NAVIGATION N/A 10/13/2019   Procedure: VIDEO BRONCHOSCOPY WITH ENDOBRONCHIAL NAVIGATION;  Surgeon: Shelah Lamar RAMAN, MD;  Location:  [facility] OR;  Service: Thoracic;  Laterality: N/A;   VIDEO BRONCHOSCOPY WITH ENDOBRONCHIAL ULTRASOUND N/A 10/13/2019   Procedure: VIDEO BRONCHOSCOPY WITH ENDOBRONCHIAL ULTRASOUND;  Surgeon: Shelah Lamar RAMAN, MD;  Location: [facility] OR;  Service: Thoracic;  Laterality: N/A;       Home Medications    Prior to Admission medications   Medication Sig Start Date End Date Taking? Authorizing Provider  albuterol  (PROVENTIL ) (2.5 MG/3ML) 0.083% nebulizer solution Take 3 mLs (2.5 mg total) by nebulization every 6 (six) hours as needed for wheezing or shortness of breath. 05/24/24   [name], Jacob T, PA-C  albuterol   (VENTOLIN  HFA) 108 (90 Base) MCG/ACT inhaler Inhale 2 puffs into the lungs every 6 (six) hours as needed for wheezing or shortness of breath.    [provider]  allopurinol  (ZYLOPRIM ) 300 MG tablet Take 300 mg by mouth daily. 01/15/17   [provider]  amLODipine  (NORVASC ) 5 MG tablet Take 1 tablet (5 mg total) by mouth daily. 05/24/24   [name], Jacob T, PA-C  apixaban  (ELIQUIS ) 5 MG TABS tablet Take 1 tablet by mouth twice daily 07/17/24   Revankar, Rajan R, MD  azelastine  (ASTELIN ) 0.1 % nasal spray Place 2 sprays into both nostrils 2 (two) times daily. Use in each nostril as directed 07/28/24   Geronimo Amel, MD  betamethasone , augmented, (DIPROLENE ) 0.05 % lotion Apply 1 Application topically daily as needed (dry skin on scalp).    [provider]  budesonide -glycopyrrolate -formoterol  (BREZTRI ) 160-9-4.8 MCG/ACT AERO inhaler Inhale 2 puffs into the lungs 2 (two) times daily. 08/11/24   Geronimo Amel, MD  Buprenorphine HCl-Naloxone HCl 8-2 MG FILM Place 0.33-1 Film under the tongue 2 (two) times daily as needed (back pain). 03/24/23   [provider]  ciclopirox  (PENLAC ) 8 % solution Apply topically at bedtime. Apply a thin coat over nail. Apply daily over previous coat. Remove weekly with nail polish remover. 12/23/23   McCaughan, Dia D, DPM  ezetimibe  (ZETIA ) 10 MG tablet Take 10 mg by mouth daily. 02/09/23   [provider]  Ferrous Sulfate  (IRON ) 325 (65 Fe) MG TABS Take 1 tablet (325 mg total) by mouth daily. 08/02/24   [name], Jacob T, PA-C  finasteride  (PROSCAR ) 5 MG tablet Take 5 mg by mouth daily. 03/13/19   [provider]  furosemide  (LASIX ) 20 MG tablet Take 1 tablet (20 mg total) by mouth daily. Take extra 20 mg for weight gain of 3 pounds a day or 5 pounds in a week. 08/15/24   Revankar, Jennifer SAUNDERS, MD  levothyroxine  (SYNTHROID ) 175 MCG tablet Take 1 tablet (175 mcg total) by mouth daily before breakfast. 08/02/24   [name], Jacob  T, PA-C  LINZESS  290 MCG CAPS capsule Take 1 capsule (290 mcg total) by mouth daily before breakfast. 08/02/24   [name], Jacob T, PA-C  Magnesium  Chloride 64 MG TABS Take 64 mg by mouth daily in the afternoon. 05/19/23   Carlin Delon BROCKS, NP  metoprolol  succinate (TOPROL -XL) 25 MG 24 hr tablet Take 1 tablet by mouth once daily 07/26/24   Revankar, Rajan R, MD  OHTUVAYRE  3 MG/2.5ML SUSP Inhale into the lungs as needed. NEB SOLUTION 08/04/24   [provider]  omeprazole  (PRILOSEC) 20 MG capsule Take 1 capsule (20 mg total) by mouth 2 (two) times daily. 08/14/24 11/12/24  [name], Jacob T, PA-C  OXYGEN  Inhale 2 L/min into the lungs See admin instructions. Inhale 2 L/min into the lungs at bedtime and as needed for shortness of breath during  the daytime    [provider]  Pitavastatin  Calcium  1 MG TABS Take 1 tablet (1 mg total) by mouth daily. 08/15/24   Revankar, Jennifer SAUNDERS, MD  sodium chloride  (OCEAN) 0.65 % SOLN nasal spray Place 1 spray into both nostrils as needed for congestion.    [provider]  sodium chloride  HYPERTONIC 3 % nebulizer solution Take by nebulization 2 (two) times daily. 3 mL 3% saline nebulizer twice daily 07/28/24   Geronimo Amel, MD  tamsulosin  (FLOMAX ) 0.4 MG CAPS capsule Take 1 capsule (0.4 mg total) by mouth daily. 08/14/24   [name], Jacob T, PA-C  zolpidem  (AMBIEN ) 10 MG tablet Take 1 tablet (10 mg total) by mouth at bedtime as needed for sleep. 05/02/24   [name], Lang DASEN, PA-C    Family History Family History  Problem Relation Age of Onset   Heart disease Father    Cancer Neg Hx    Diabetes Neg Hx    Stroke Neg Hx    Hypertension Neg Hx    Kidney disease Neg Hx     Social History Social History   Tobacco Use   Smoking status: Former    Current packs/day: 0.00    Average packs/day: 3.0 packs/day for 43.0 years (129.0 ttl pk-yrs)    Types: Cigarettes    Start date: 10/22/1958    Quit date: 10/22/2001    Years since quitting:  22.8    Passive exposure: Current   Smokeless tobacco: Never  Vaping Use   Vaping status: Never Used  Substance Use Topics   Alcohol  use: No   Drug use: No     Allergies   Hydrochlorothiazide , Lipitor [atorvastatin ], Omnipaque  [iohexol ], and Tape   Review of Systems Review of Systems  Constitutional:  Positive for fever. Negative for chills.  HENT:  Negative for ear pain and sore throat.   Eyes:  Negative for pain and visual disturbance.  Respiratory:  Positive for cough, chest tightness, shortness of breath and wheezing.   Cardiovascular:  Negative for chest pain and palpitations.  Gastrointestinal:  Negative for abdominal pain, constipation, diarrhea, nausea and vomiting.  Genitourinary:  Negative for dysuria and hematuria.  Musculoskeletal:  Negative for arthralgias and back pain.  Skin:  Negative for color change and rash.  Neurological:  Negative for seizures and syncope.  All other systems reviewed and are negative.    Physical Exam Triage Vital Signs ED Triage Vitals  Encounter Vitals Group     BP 08/18/24 1324 113/60     Girls Systolic BP Percentile --      Girls Diastolic BP Percentile --      Boys Systolic BP Percentile --      Boys Diastolic BP Percentile --      Pulse Rate 08/18/24 1324 78     Resp 08/18/24 1324 20     Temp 08/18/24 1324 99 F (37.2 C)     Temp Source 08/18/24 1324 Oral     SpO2 08/18/24 1324 (!) 88 %     Weight 08/18/24 1325 205 lb (93 kg)     Height 08/18/24 1325 6' (1.829 m)     Head Circumference --      Peak Flow --      Pain Score 08/18/24 1325 0     Pain Loc --      Pain Education --      Exclude from Growth Chart --    No data found.  Updated Vital Signs BP (!) 128/59 (BP  Location: Right Arm)   Pulse 76   Temp 98.7 F (37.1 C) (Oral)   Resp 20   Ht 6' (1.829 m)   Wt 205 lb (93 kg)   SpO2 93%   BMI 27.80 kg/m   Visual Acuity Right Eye Distance:   Left Eye Distance:   Bilateral Distance:    Right Eye Near:    Left Eye Near:    Bilateral Near:     Physical Exam Vitals and nursing note reviewed.  Constitutional:      General: He is not in acute distress.    Appearance: He is well-developed. He is ill-appearing. He is not toxic-appearing or diaphoretic.  HENT:     Head: Normocephalic and atraumatic.     Right Ear: Hearing, tympanic membrane, ear canal and external ear normal.     Left Ear: Hearing, tympanic membrane, ear canal and external ear normal.     Nose: Congestion and rhinorrhea present. Rhinorrhea is clear.     Right Sinus: No maxillary sinus tenderness or frontal sinus tenderness.     Left Sinus: No maxillary sinus tenderness or frontal sinus tenderness.     Mouth/Throat:     Lips: Pink.     Mouth: Mucous membranes are moist.     Pharynx: Uvula midline. No oropharyngeal exudate or posterior oropharyngeal erythema.     Tonsils: No tonsillar exudate.  Eyes:     Conjunctiva/sclera: Conjunctivae normal.     Pupils: Pupils are equal, round, and reactive to light.  Cardiovascular:     Rate and Rhythm: Normal rate and regular rhythm.     Heart sounds: S1 normal and S2 normal. No murmur heard. Pulmonary:     Effort: Pulmonary effort is normal. No respiratory distress.     Breath sounds: Examination of the right-upper field reveals wheezing and rhonchi. Examination of the left-upper field reveals wheezing and rhonchi. Examination of the right-middle field reveals wheezing and rhonchi. Examination of the left-middle field reveals wheezing and rhonchi. Examination of the right-lower field reveals decreased breath sounds, wheezing and rhonchi. Examination of the left-lower field reveals decreased breath sounds, wheezing and rhonchi. Decreased breath sounds, wheezing and rhonchi present. No rales.     Comments: Patient has inspiratory and expiratory wheezes with rhonchi throughout and decreased breath sounds in the bases.  He arrived on home oxygen  at 2 L/min.  Initially his oxygen  saturation at  arrival was 90% in our waiting room.  His oxygen  was running low so he was switched over to our oxygen .  When moved to the room for triage, his O2 sat was 84-88% on 2 L/min.  His oxygen  was increased to 3 L/min and he was given a DuoNeb treatment.  Reassessment after DuoNeb treatment: Oxygen  saturation went up to 90-92% on 3 L/min.  5 minutes after his DuoNeb treatment, his O2 sat was down to 88 on 3 L/min and his oxygen  was increased to 4 L/min.  I went back to recheck the patient and his oxygen  saturation was worse but it turns out the oxygen  tank was near empty.  When he was placed on a fresh oxygen  tank, his O2 sat was 90-92 percent on 3 L/min.  His breath sounds improved with less wheezing and less rhonchi but he still has inspiratory and expiratory wheezes throughout. Abdominal:     General: Bowel sounds are normal.     Palpations: Abdomen is soft.     Tenderness: There is no abdominal tenderness.  Musculoskeletal:  General: No swelling.     Cervical back: Neck supple.  Lymphadenopathy:     Head:     Right side of head: Tonsillar adenopathy present. No submental, submandibular, preauricular or posterior auricular adenopathy.     Left side of head: Tonsillar adenopathy present. No submental, submandibular, preauricular or posterior auricular adenopathy.     Cervical: Cervical adenopathy present.     Right cervical: Superficial cervical adenopathy present.     Left cervical: Superficial cervical adenopathy present.  Skin:    General: Skin is warm and dry.     Capillary Refill: Capillary refill takes less than 2 seconds.     Findings: No rash.  Neurological:     Mental Status: He is alert and oriented to person, place, and time.  Psychiatric:        Mood and Affect: Mood normal.      UC Treatments / Results  Labs (all labs ordered are listed, but only abnormal results are displayed) Labs Reviewed  POC COVID19/FLU A&B COMBO    EKG   Radiology DG Chest 2 View Result  Date: 08/18/2024 EXAM: 2 VIEW(S) XRAY OF THE CHEST 08/18/2024 01:50:00 PM COMPARISON: 04/17/2024 CLINICAL HISTORY: cough FINDINGS: LINES, TUBES AND DEVICES: Right chest port in place. LUNGS AND PLEURA: Stable loculated fluid collection and consolidation at the right lung apex. New airspace disease in the left lower lobe and lingula concerning for pneumonia. Increased fluid along the right fissure versus atelectasis. No pleural effusion. No pneumothorax. HEART AND MEDIASTINUM: No acute abnormality of the cardiac and mediastinal silhouettes. BONES AND SOFT TISSUES: No acute osseous abnormality. IMPRESSION: 1. New airspace disease in the left lower lobe and lingula, concerning for pneumonia. 2. Stable loculated fluid collection and consolidation at the right lung apex. 3. Increased fluid along the right fissure versus atelectasis. Electronically signed by: Norleen Boxer MD 08/18/2024 02:22 PM EST RP Workstation: [id number]    Procedures Procedures (including critical care time)  Medications Ordered in UC Medications  ipratropium-albuterol  (DUONEB) 0.5-2.5 (3) MG/3ML nebulizer solution 3 mL (3 mLs Nebulization Given 08/18/24 1331)    Initial Impression / Assessment and Plan / UC Course  I have reviewed the triage vital signs and the nursing notes.  Pertinent labs & imaging results that were available during my care of the patient were reviewed by me and considered in my medical decision making (see chart for details).  Plan of Care: Left lower lobe pneumonia with cough and hypoxia: Rapid COVID and rapid flu were negative.  Chest x-ray shows new left lower lobe pneumonia and stable loculated fluid in the right apex.  Given patient's persistent hypoxia on 3 L/min of oxygen , he will be discharged to [facility] emergency room via family vehicle.  He has oxygen  for transport and a pulse oximeter.  His wife will drive him.  He did not want to go by ambulance which is my recommendation.  His vital signs are  stable.  My best advice would be for EMS to transport but he is choosing to go by family vehicle with his wife driving.  I reviewed the plan of care with the patient and/or the patient's guardian.  The patient and/or guardian had time to ask questions and acknowledged that the questions were answered.  I provided instruction on symptoms or reasons to return here or to go to an ER, if symptoms/condition did not improve, worsened or if new symptoms occurred.   I spent 40 minutes on patient care, including face to face time  and care planning/patient management.  Additional time was needed in review of his medical history, collection of his symptoms, managing his care here in the office since he was hypoxic and discussing plan of care. Final Clinical Impressions(s) / UC Diagnoses   Final diagnoses:  Acute cough  Hypoxia  Pneumonia of left lower lobe due to infectious organism     Discharge Instructions      Left lower lobe pneumonia with cough and hypoxia: Rapid COVID and rapid flu were negative.  Chest x-ray shows new left lower lobe pneumonia and stable loculated fluid in the right apex.  Given patient's persistent hypoxia on 3 L/min of oxygen , he will be discharged to [facility] emergency room via family vehicle.  He has oxygen  for transport and a pulse oximeter.  His wife will drive him.  He did not want to go by ambulance which is my recommendation.  His vital signs are stable.  My best advice would be for EMS to transport but he is choosing to go by family vehicle with his wife driving.     ED Prescriptions   None    PDMP not reviewed this encounter.   Ival Domino, FNP 08/18/24 [phone]

## 2024-08-18 NOTE — Discharge Instructions (Addendum)
 Left lower lobe pneumonia with cough and hypoxia: Rapid COVID and rapid flu were negative.  Chest x-ray shows new left lower lobe pneumonia and stable loculated fluid in the right apex.  Given patient's persistent hypoxia on 3 L/min of oxygen , he will be discharged to [facility] emergency room via family vehicle.  He has oxygen  for transport and a pulse oximeter.  His wife will drive him.  He did not want to go by ambulance which is my recommendation.  His vital signs are stable.  My best advice would be for EMS to transport but he is choosing to go by family vehicle with his wife driving.

## 2024-08-19 ENCOUNTER — Inpatient Hospital Stay (HOSPITAL_COMMUNITY)

## 2024-08-19 ENCOUNTER — Encounter (HOSPITAL_COMMUNITY): Payer: Self-pay | Admitting: Internal Medicine

## 2024-08-19 DIAGNOSIS — Z7901 Long term (current) use of anticoagulants: Secondary | ICD-10-CM | POA: Diagnosis not present

## 2024-08-19 DIAGNOSIS — D638 Anemia in other chronic diseases classified elsewhere: Secondary | ICD-10-CM | POA: Diagnosis present

## 2024-08-19 DIAGNOSIS — F32A Depression, unspecified: Secondary | ICD-10-CM | POA: Diagnosis present

## 2024-08-19 DIAGNOSIS — Z1152 Encounter for screening for COVID-19: Secondary | ICD-10-CM | POA: Diagnosis not present

## 2024-08-19 DIAGNOSIS — J690 Pneumonitis due to inhalation of food and vomit: Secondary | ICD-10-CM | POA: Diagnosis present

## 2024-08-19 DIAGNOSIS — I272 Pulmonary hypertension, unspecified: Secondary | ICD-10-CM | POA: Diagnosis present

## 2024-08-19 DIAGNOSIS — K5909 Other constipation: Secondary | ICD-10-CM | POA: Diagnosis present

## 2024-08-19 DIAGNOSIS — E039 Hypothyroidism, unspecified: Secondary | ICD-10-CM | POA: Diagnosis present

## 2024-08-19 DIAGNOSIS — J440 Chronic obstructive pulmonary disease with acute lower respiratory infection: Secondary | ICD-10-CM | POA: Diagnosis present

## 2024-08-19 DIAGNOSIS — J9621 Acute and chronic respiratory failure with hypoxia: Secondary | ICD-10-CM | POA: Diagnosis present

## 2024-08-19 DIAGNOSIS — I5032 Chronic diastolic (congestive) heart failure: Secondary | ICD-10-CM | POA: Diagnosis present

## 2024-08-19 DIAGNOSIS — Z9981 Dependence on supplemental oxygen: Secondary | ICD-10-CM | POA: Diagnosis not present

## 2024-08-19 DIAGNOSIS — K219 Gastro-esophageal reflux disease without esophagitis: Secondary | ICD-10-CM | POA: Diagnosis present

## 2024-08-19 DIAGNOSIS — I2510 Atherosclerotic heart disease of native coronary artery without angina pectoris: Secondary | ICD-10-CM | POA: Diagnosis present

## 2024-08-19 DIAGNOSIS — I480 Paroxysmal atrial fibrillation: Secondary | ICD-10-CM | POA: Diagnosis present

## 2024-08-19 DIAGNOSIS — N179 Acute kidney failure, unspecified: Secondary | ICD-10-CM | POA: Diagnosis present

## 2024-08-19 DIAGNOSIS — J189 Pneumonia, unspecified organism: Secondary | ICD-10-CM | POA: Diagnosis present

## 2024-08-19 DIAGNOSIS — I10 Essential (primary) hypertension: Secondary | ICD-10-CM | POA: Diagnosis not present

## 2024-08-19 DIAGNOSIS — G720 Drug-induced myopathy: Secondary | ICD-10-CM | POA: Diagnosis present

## 2024-08-19 DIAGNOSIS — N400 Enlarged prostate without lower urinary tract symptoms: Secondary | ICD-10-CM | POA: Diagnosis present

## 2024-08-19 DIAGNOSIS — E875 Hyperkalemia: Secondary | ICD-10-CM | POA: Diagnosis present

## 2024-08-19 DIAGNOSIS — I110 Hypertensive heart disease with heart failure: Secondary | ICD-10-CM | POA: Diagnosis present

## 2024-08-19 DIAGNOSIS — J441 Chronic obstructive pulmonary disease with (acute) exacerbation: Secondary | ICD-10-CM | POA: Diagnosis present

## 2024-08-19 DIAGNOSIS — D696 Thrombocytopenia, unspecified: Secondary | ICD-10-CM | POA: Diagnosis present

## 2024-08-19 DIAGNOSIS — E7800 Pure hypercholesterolemia, unspecified: Secondary | ICD-10-CM | POA: Diagnosis present

## 2024-08-19 LAB — COMPREHENSIVE METABOLIC PANEL WITH GFR
ALT: 16 U/L (ref 0–44)
AST: 18 U/L (ref 15–41)
Albumin: 3.8 g/dL (ref 3.5–5.0)
Alkaline Phosphatase: 126 U/L (ref 38–126)
Anion gap: 7 (ref 5–15)
BUN: 33 mg/dL — ABNORMAL HIGH (ref 8–23)
CO2: 29 mmol/L (ref 22–32)
Calcium: 9.2 mg/dL (ref 8.9–10.3)
Chloride: 103 mmol/L (ref 98–111)
Creatinine, Ser: 1.04 mg/dL (ref 0.61–1.24)
GFR, Estimated: >60 mL/min (ref >60)
Glucose, Bld: 161 mg/dL — ABNORMAL HIGH (ref 70–99)
Potassium: 5.6 mmol/L — ABNORMAL HIGH (ref 3.5–5.1)
Sodium: 139 mmol/L (ref 135–145)
Total Bilirubin: 0.4 mg/dL (ref 0.0–1.2)
Total Protein: 6.5 g/dL (ref 6.5–8.1)

## 2024-08-19 LAB — MAGNESIUM: Magnesium: 2.5 mg/dL — ABNORMAL HIGH (ref 1.7–2.4)

## 2024-08-19 LAB — CBC
HCT: 35.6 % — ABNORMAL LOW (ref 39.0–52.0)
Hemoglobin: 10.8 g/dL — ABNORMAL LOW (ref 13.0–17.0)
MCH: 27.9 pg (ref 26.0–34.0)
MCHC: 30.3 g/dL (ref 30.0–36.0)
MCV: 92 fL (ref 80.0–100.0)
Platelets: 140 K/uL — ABNORMAL LOW (ref 150–400)
RBC: 3.87 MIL/uL — ABNORMAL LOW (ref 4.22–5.81)
RDW: 14.6 % (ref 11.5–15.5)
WBC: 5.8 K/uL (ref 4.0–10.5)
nRBC: 0 % (ref 0.0–0.2)

## 2024-08-19 MED ORDER — POLYETHYLENE GLYCOL 3350 17 G PO PACK
17.0000 g | PACK | Freq: Every day | ORAL | Status: DC | PRN
  Filled 2024-08-19 (×2): qty 1

## 2024-08-19 MED ORDER — PANTOPRAZOLE SODIUM 40 MG PO TBEC
40.0000 mg | DELAYED_RELEASE_TABLET | Freq: Every day | ORAL | Status: DC
  Administered 2024-08-19: 40 mg via ORAL
  Filled 2024-08-19 (×3): qty 1

## 2024-08-19 MED ORDER — AMLODIPINE BESYLATE 5 MG PO TABS
5.0000 mg | ORAL_TABLET | Freq: Every day | ORAL | Status: DC
  Administered 2024-08-20 – 2024-08-21 (×2): 5 mg via ORAL
  Filled 2024-08-19 (×3): qty 1

## 2024-08-19 MED ORDER — OMEPRAZOLE 20 MG PO CPDR
20.0000 mg | DELAYED_RELEASE_CAPSULE | Freq: Two times a day (BID) | ORAL | Status: DC
  Administered 2024-08-20 – 2024-08-21 (×3): 20 mg via ORAL
  Filled 2024-08-19 (×5): qty 1

## 2024-08-19 MED ORDER — ALBUTEROL SULFATE (2.5 MG/3ML) 0.083% IN NEBU
2.5000 mg | INHALATION_SOLUTION | Freq: Four times a day (QID) | RESPIRATORY_TRACT | Status: DC | PRN
  Administered 2024-08-20: 2.5 mg via RESPIRATORY_TRACT
  Filled 2024-08-19 (×5): qty 3

## 2024-08-19 MED ORDER — ALLOPURINOL 300 MG PO TABS
300.0000 mg | ORAL_TABLET | Freq: Every day | ORAL | Status: DC
  Administered 2024-08-19 – 2024-08-21 (×3): 300 mg via ORAL
  Filled 2024-08-19 (×4): qty 1

## 2024-08-19 MED ORDER — BUDESON-GLYCOPYRROL-FORMOTEROL 160-9-4.8 MCG/ACT IN AERO
2.0000 | INHALATION_SPRAY | Freq: Two times a day (BID) | RESPIRATORY_TRACT | Status: DC
  Administered 2024-08-19 – 2024-08-21 (×4): 2 via RESPIRATORY_TRACT
  Filled 2024-08-19 (×2): qty 5.9

## 2024-08-19 MED ORDER — FUROSEMIDE 20 MG PO TABS
20.0000 mg | ORAL_TABLET | Freq: Every day | ORAL | Status: DC
  Administered 2024-08-19 – 2024-08-21 (×3): 20 mg via ORAL
  Filled 2024-08-19 (×4): qty 1

## 2024-08-19 MED ORDER — FERROUS SULFATE 325 (65 FE) MG PO TABS
325.0000 mg | ORAL_TABLET | Freq: Every day | ORAL | Status: DC
  Administered 2024-08-19 – 2024-08-20 (×2): 325 mg via ORAL
  Filled 2024-08-19 (×3): qty 1

## 2024-08-19 MED ORDER — CHLORHEXIDINE GLUCONATE CLOTH 2 % EX PADS
6.0000 | MEDICATED_PAD | Freq: Every day | CUTANEOUS | Status: DC
  Administered 2024-08-20: 6 via TOPICAL

## 2024-08-19 MED ORDER — METHYLPREDNISOLONE SODIUM SUCC 125 MG IJ SOLR
80.0000 mg | INTRAMUSCULAR | Status: DC
  Administered 2024-08-20: 80 mg via INTRAVENOUS
  Filled 2024-08-19 (×3): qty 1.28

## 2024-08-19 MED ORDER — LINACLOTIDE 145 MCG PO CAPS
290.0000 ug | ORAL_CAPSULE | Freq: Every day | ORAL | Status: DC
  Administered 2024-08-20 – 2024-08-21 (×2): 290 ug via ORAL
  Filled 2024-08-19 (×3): qty 2

## 2024-08-19 MED ORDER — EZETIMIBE 10 MG PO TABS
10.0000 mg | ORAL_TABLET | Freq: Every day | ORAL | Status: DC
  Administered 2024-08-19 – 2024-08-21 (×3): 10 mg via ORAL
  Filled 2024-08-19 (×4): qty 1

## 2024-08-19 MED ORDER — SODIUM CHLORIDE 0.9% FLUSH: 10.0000 mL | INTRAVENOUS | Status: DC | PRN

## 2024-08-19 MED ORDER — SODIUM CHLORIDE 0.9% FLUSH
10.0000 mL | Freq: Two times a day (BID) | INTRAVENOUS | Status: DC
  Administered 2024-08-20 – 2024-08-21 (×3): 10 mL

## 2024-08-19 MED ORDER — FINASTERIDE 5 MG PO TABS
5.0000 mg | ORAL_TABLET | Freq: Every day | ORAL | Status: DC
  Administered 2024-08-19 – 2024-08-20 (×2): 5 mg via ORAL
  Filled 2024-08-19 (×3): qty 1

## 2024-08-19 MED ORDER — METOPROLOL SUCCINATE ER 25 MG PO TB24
25.0000 mg | ORAL_TABLET | Freq: Every day | ORAL | Status: DC
  Administered 2024-08-19 – 2024-08-21 (×3): 25 mg via ORAL
  Filled 2024-08-19 (×4): qty 1

## 2024-08-19 MED ORDER — ONDANSETRON HCL 4 MG PO TABS
4.0000 mg | ORAL_TABLET | Freq: Four times a day (QID) | ORAL | Status: DC | PRN
  Filled 2024-08-19 (×5): qty 1

## 2024-08-19 MED ORDER — LEVOTHYROXINE SODIUM 175 MCG PO TABS
175.0000 ug | ORAL_TABLET | Freq: Every day | ORAL | Status: DC
  Administered 2024-08-20 – 2024-08-21 (×2): 175 ug via ORAL
  Filled 2024-08-19 (×3): qty 1

## 2024-08-19 MED ORDER — MAGNESIUM CHLORIDE 64 MG PO TABS: 64.0000 mg | ORAL_TABLET | Freq: Every day | ORAL | Status: DC

## 2024-08-19 MED ORDER — TAMSULOSIN HCL 0.4 MG PO CAPS
0.4000 mg | ORAL_CAPSULE | Freq: Every day | ORAL | Status: DC
  Administered 2024-08-19 – 2024-08-20 (×2): 0.4 mg via ORAL
  Filled 2024-08-19 (×3): qty 1

## 2024-08-19 MED ORDER — VITAMIN C 500 MG PO TABS
1000.0000 mg | ORAL_TABLET | Freq: Every day | ORAL | Status: DC
  Administered 2024-08-19 – 2024-08-21 (×3): 1000 mg via ORAL
  Filled 2024-08-19 (×4): qty 2

## 2024-08-19 MED ORDER — AZITHROMYCIN 500 MG PO TABS
500.0000 mg | ORAL_TABLET | Freq: Every day | ORAL | Status: DC
  Administered 2024-08-20 – 2024-08-21 (×2): 500 mg via ORAL
  Filled 2024-08-19: qty 2
  Filled 2024-08-19 (×2): qty 1

## 2024-08-19 MED ORDER — SODIUM ZIRCONIUM CYCLOSILICATE 5 G PO PACK
5.0000 g | PACK | Freq: Once | ORAL | Status: AC
  Administered 2024-08-19: 5 g via ORAL
  Filled 2024-08-19: qty 1

## 2024-08-19 MED ORDER — SODIUM ZIRCONIUM CYCLOSILICATE 10 G PO PACK: 10.0000 g | PACK | Freq: Once | ORAL | Status: DC

## 2024-08-19 MED ORDER — SODIUM CHLORIDE 3 % IN NEBU
3.0000 mL | INHALATION_SOLUTION | Freq: Two times a day (BID) | RESPIRATORY_TRACT | Status: DC
  Administered 2024-08-19 – 2024-08-21 (×4): 3 mL via RESPIRATORY_TRACT
  Filled 2024-08-19 (×7): qty 4

## 2024-08-19 MED ORDER — SODIUM CHLORIDE 0.9 % IV SOLN
2.0000 g | INTRAVENOUS | Status: DC
  Administered 2024-08-20 – 2024-08-21 (×2): 2 g via INTRAVENOUS
  Filled 2024-08-19 (×2): qty 20

## 2024-08-19 MED ORDER — ENSIFENTRINE 3 MG/2.5ML IN SUSP: RESPIRATORY_TRACT | Status: DC | PRN

## 2024-08-19 MED ORDER — ACETAMINOPHEN 650 MG RE SUPP
650.0000 mg | Freq: Four times a day (QID) | RECTAL | Status: DC | PRN
  Filled 2024-08-19 (×4): qty 1

## 2024-08-19 MED ORDER — ACETAMINOPHEN 325 MG PO TABS
650.0000 mg | ORAL_TABLET | Freq: Four times a day (QID) | ORAL | Status: DC | PRN
  Filled 2024-08-19 (×4): qty 2

## 2024-08-19 MED ORDER — PITAVASTATIN CALCIUM 1 MG PO TABS: 1.0000 mg | ORAL_TABLET | Freq: Every day | ORAL | Status: DC

## 2024-08-19 MED ORDER — PRAVASTATIN SODIUM 10 MG PO TABS
20.0000 mg | ORAL_TABLET | Freq: Every day | ORAL | Status: DC
  Filled 2024-08-19: qty 1

## 2024-08-19 MED ORDER — GUAIFENESIN 100 MG/5ML PO LIQD
5.0000 mL | ORAL | Status: DC | PRN
  Filled 2024-08-19 (×4): qty 5

## 2024-08-19 MED ORDER — ALBUTEROL SULFATE (2.5 MG/3ML) 0.083% IN NEBU: 2.5000 mg | INHALATION_SOLUTION | Freq: Four times a day (QID) | RESPIRATORY_TRACT | Status: DC | PRN

## 2024-08-19 MED ORDER — AZELASTINE HCL 0.1 % NA SOLN
2.0000 | Freq: Two times a day (BID) | NASAL | Status: DC
  Administered 2024-08-19 – 2024-08-21 (×4): 2 via NASAL
  Filled 2024-08-19 (×2): qty 30

## 2024-08-19 MED ORDER — MAGNESIUM CHLORIDE 64 MG PO TBEC
1.0000 | DELAYED_RELEASE_TABLET | Freq: Every day | ORAL | Status: DC
  Administered 2024-08-20 – 2024-08-21 (×2): 64 mg via ORAL
  Filled 2024-08-19 (×3): qty 1

## 2024-08-19 NOTE — Procedures (Addendum)
 Modified Barium Swallow Study  Patient Details  Name: [name] MRN: [id number] Date of Birth: 10-Jun-1951  Today's Date: 08/19/2024  Modified Barium Swallow completed.  Full report located under Chart Review in the Imaging Section.  History of Present Illness [name] is 73 y.o. male who presented 11/28 with worsening shortness of breath.  CXR 11/28 showed new airspace disease in the left lower lobe and lingula concerning for pneumonia.  Pt known to this service from prior admissions.  MBSS is 2017 showed intermittent laryngeal penetration of thin liquids and trace SILENT aspiration of sequential boluses of thin due to decreased laryngeal elevation/closure.  He has since been seen for clinical management of dysphagia but no further instrumental testing.  Pt reports this is his second case of pna this year.  Pt with medical history significant of COPD, chronic respiratory failure with hypoxia using 2 L oxygen  by nasal cannula at night, chronic diastolic CHF, pulmonary hypertension, hypertension, hyperlipidemia GERD, BPH, depression, non-small cell cancer of right upper lobe status post SBRT, non-Hodgkin lymphoma and paroxysmal A-fib on Eliquis    Clinical Impression Pt presents with a mild oropharyngeal dysphagia c/b delayed swallow initiation, reduced base of tongue retraction, reduced hyolaryngeal elevation and excurision, absent epiglottic inversion (which may be 2/2 possible cervical osteophytes - see image below), incomplete laryngeal closure, and diminished sensation.  These deficits resulted in intermittent silent aspiration of thin liquids by cup.  With straw sip there was a greater volume of penetration/aspiration which was sensed.  Cued cough was beneficial to clear aspiration and reduced but did not fully clear penetration.  There was mild pharyngeal residue in vallecula, pyriform sinuses and a trace coating along posterior pharyngeal wall.  On esophageal sweep during pill  simulation there was retention and backflow of contrast within the esophagus and brief stasis of barium tablet.  Would recommend medications whole with puree.  Today's study is fairly consistent with results reported on MBSS in 2017, which is reasurring.  Pt would benefit from ST at next level of care to improve swallowing and possibly cough strength, to improve clearance of penetrant.  Recommend continuing regular diet with thin liquid by cup with regular throat clear.  DIGEST Swallow Severity Rating*  Safety: 1  Efficiency: 1  Overall Pharyngeal Swallow Severity: 1 1: mild; 2: moderate; 3: severe; 4: profound  *The Dynamic Imaging Grade of Swallowing Toxicity is standardized for the head and neck cancer population, however, demonstrates promising clinical applications across populations to standardize the clinical rating of pharyngeal swallow safety and severity.   Possible cervical osteophytes:   Factors that may increase risk of adverse event in presence of aspiration Noe & Lianne 2021):    Swallow Evaluation Recommendations Recommendations: PO diet PO Diet Recommendation: Regular;Thin liquids (Level 0) Liquid Administration via: Cup;No straw Medication Administration: Whole meds with puree Supervision: Patient able to self-feed Swallowing strategies  : Slow rate;Small bites/sips;Clear throat intermittently Postural changes: Position pt fully upright for meals;Stay upright 30-60 min after meals Oral care recommendations: Oral care BID (2x/day)      Anette FORBES Grippe, MA, CCC-SLP Acute Rehabilitation Services Office: [phone] 08/19/2024,4:46 PM

## 2024-08-19 NOTE — Progress Notes (Signed)
 1910--Call Video - Introductory Contact, Patient A&O x4. Family, [facility] paramedic, and EMT in the home. Overnight monitoring plan and [facility] contact information reviewed.      1949-- 2059-Call Video - Patient identifiers reviewed. Family and paramedics are still in the home with patient. Skin assessment, medication administration assistance, and additional overnight monitoring plan reviewed. Patient pain reported at 0. No pressure sores or wound to the back or buttocks identified at the time of 2 person assessment.  Assessment completed. All patient questions supported. Patient encouraged to call as needed, patient will continue to be monitored via current health monitoring. Night APP notified of medication patient reported he is taking but not currently on MAR. APP also notified patient decided not to take Pitavastatin . APP acknowledged and placed new orders.      2329--Outreach call to patient to check in on current status. No Current Health data alerts, Sp02 no longer populating. Patient reached and confirmed per own pulse oximeter Sp02 is 93%. Patient encouraged to assess and adjust wearable. Sp02 showing via Current Health by the end of the call as 96%.  Patient reported he will report any any change in condition and compare Sp02 readings with own pulse Ox if needed.

## 2024-08-19 NOTE — Plan of Care (Signed)
 HOSPITAL AT HOME PROGRESS NOTE                                                                                                                                                                                                             Patient Demographics:    [name], is a 73 y.o. male, DOB - [birth year], FMW:[id number]  Outpatient Primary MD for the patient is [name], Lang [name]    LOS - 0  Admit date - 08/18/2024     Brief Narrative (HPI from H&P)  73 y.o. male with medical history significant of COPD, chronic respiratory failure with hypoxia using 2 L oxygen  by nasal cannula at night, chronic diastolic CHF, pulmonary hypertension, hypertension, hyperlipidemia GERD, BPH, depression, non-small cell cancer of right upper lobe status post SBRT, non-Hodgkin lymphoma and paroxysmal A-fib on Eliquis  presented with worsening shortness of breath.  On presentation, his oxygen  saturation was 75% on room air; he was put on 4 L oxygen  via nasal cannula.  RSV/influenza/COVID PCR negative. Chest x-ray showed new airspace disease in the left lower lobe and lingula concerning for pneumonia. He was started on IV antibiotics and Solu-Medrol .   Patient was approached about the hospital at home program on 11/29. Patient verbally agreed to the program. Patient was asked to call his spouse, [name] [phone]), and after further discussions about the program over the phone, she was in agreement for patient to be transferred to the program. Consent form was formally signed later that day prior to transfer.    Subjective:    Patient seen and examined at bedside. Feels slightly better but still short of breath with exertion with intermittent cough. Does not feel ready to go home today. No fever or vomiting reported.    Assessment  & Plan :    Assessment and Plan: # Community-acquired bacterial left sided pneumonia: Bacteria  unspecified # Acute on chronic hypoxic respiratory failure - Normally wears 2 L oxygen  via nasal cannula at night only.  Required 4 L on presentation. Improving to 2 L this morning. Imaging as above. COVID/influenza/RSV PCR negative. Continue Rocephin  and Zithromax . Follow cultures   # COPD exacerbation - Still with some shortness of breath with exertion and mild expiratory wheezes. Continue Solu-Medrol  and PRN nebs. Resume home bronchodilator. Outpatient follow-up with pulmonary   # Paroxysmal A-fib - Currently  rate controlled. Resume Eliquis . Resume beta-blocker    # Dysphagia - Completed MBS on 11/29, stable compared to last test in 2017. SLP recommending OP speech therapy to improve swallow and cough strength. Continue regular diet  # Chronic diastolic heart failure - Currently compensated. Strict input and output. Daily weights. AKI resolved. Resume lasix     # AKI, resolved - Improved with gentle hydration. DC IV fluids. Repeat a.m. labs  # Hyperkalemia - K+ elevated to 5.6 today. Gave Lokelma  5 g x 1 and repeat morning labs. Resuming home lasix  should help improve hyperkalemia.    # Leukocytosis - Resolved   # Thrombocytopenia - Platelets slightly down to 140 today. Questionable cause. No signs of bleeding. Monitor intermittently   # Anemia of chronic disease - From chronic illnesses. Hemoglobin stable   # Hypothyroidism - Continue home levothyroxine   # Hyperlipidemia - Continue Zetia   # BPH - Continue tamsulosin  and finasteride   # Gout - Continue allopurinol    # Physical deconditioning - Patient evaluated by PT, no follow up recommended   # Non-small cell cancer of right upper lobe status post SBRT #  Non-Hodgkin lymphoma status post chemotherapy - Currently on observation. Outpatient follow-up with oncology      Nutrition Problem:        Overweight: Estimated body mass index is 29.39 kg/m as calculated from the following:   Height as of this  encounter: 6' (1.829 m).   Weight as of this encounter: 98.3 kg.         Condition - stable  Family Communication  : Discussed transfer to hospital home program with patient and spouse, [name], [phone])  Code Status : Full Code  Consults  : PT/SLP  PUD Prophylaxis : None   Procedures  :     11/29: Modified barium swallow study      Disposition Plan  :    Status is: Inpatient Remains inpatient appropriate because: IV antibiotics for pneumonia, IV steroids for COPD exacerbation.  DVT Prophylaxis  :     apixaban  (ELIQUIS ) tablet 5 mg     Lab Results  Component Value Date   PLT 140 (L) 08/19/2024    Diet :  Diet Order             Diet regular Room service appropriate? Yes; Fluid consistency: Thin  Diet effective now                    Inpatient Medications  Scheduled Meds:  allopurinol   300 mg Oral Daily   apixaban   5 mg Oral BID   ascorbic acid  1,000 mg Oral Daily   azelastine   2 spray Each Nare BID   [START ON 08/20/2024] azithromycin   500 mg Oral Daily   budesonide -glycopyrrolate -formoterol   2 puff Inhalation BID   ezetimibe   10 mg Oral Daily   ferrous sulfate   325 mg Oral QHS   finasteride   5 mg Oral QHS   furosemide   20 mg Oral Daily   [START ON 08/20/2024] levothyroxine   175 mcg Oral QAC breakfast   [START ON 08/20/2024] linaclotide   290 mcg Oral QAC breakfast   [START ON 08/20/2024] methylPREDNISolone  (SOLU-MEDROL ) injection  80 mg Intravenous Q24H   metoprolol  succinate  25 mg Oral Daily   pantoprazole   40 mg Oral Daily   pravastatin  20 mg Oral q1800   sodium chloride  HYPERTONIC  3 mL Nebulization BID   tamsulosin   0.4 mg Oral QHS   Continuous Infusions:  [START  ON 08/20/2024] cefTRIAXone  (ROCEPHIN )  IV     PRN Meds:.acetaminophen  **OR** acetaminophen , albuterol , Ensifentrine , guaiFENesin , ondansetron , polyethylene glycol  Antibiotics  :    Anti-infectives (From admission, onward)    Start     Dose/Rate Route  Frequency Ordered Stop   08/20/24 1000  azithromycin  (ZITHROMAX ) tablet 500 mg        500 mg Oral Daily 08/19/24 1512 08/23/24 0959   08/20/24 1000  cefTRIAXone  (ROCEPHIN ) 2 g in sodium chloride  0.9 % 100 mL IVPB        2 g 200 mL/hr over 30 Minutes Intravenous Every 24 hours 08/19/24 1617 08/23/24 0959   08/19/24 1000  cefTRIAXone  (ROCEPHIN ) 2 g in sodium chloride  0.9 % 100 mL IVPB  Status:  Discontinued        2 g 200 mL/hr over 30 Minutes Intravenous Every 24 hours 08/18/24 1736 08/19/24 1617   08/19/24 1000  azithromycin  (ZITHROMAX ) 500 mg in sodium chloride  0.9 % 250 mL IVPB  Status:  Discontinued        500 mg 250 mL/hr over 60 Minutes Intravenous Every 24 hours 08/18/24 1736 08/19/24 1512   08/18/24 1615  cefTRIAXone  (ROCEPHIN ) 1 g in sodium chloride  0.9 % 100 mL IVPB        1 g 200 mL/hr over 30 Minutes Intravenous  Once 08/18/24 1606 08/18/24 1711   08/18/24 1615  azithromycin  (ZITHROMAX ) 500 mg in sodium chloride  0.9 % 250 mL IVPB        500 mg 250 mL/hr over 60 Minutes Intravenous  Once 08/18/24 1606 08/18/24 1819         Objective:   Vitals:   08/19/24 0850 08/19/24 1147 08/19/24 1158 08/19/24 1351  BP:  132/60  124/75  Pulse:  67  74  Resp:    20  Temp:    98.3 F (36.8 C)  TempSrc:    Oral  SpO2: 95%  96% 98%  Weight:      Height:        Wt Readings from Last 3 Encounters:  08/18/24 98.3 kg  08/18/24 93 kg  08/15/24 96.5 kg     Intake/Output Summary (Last 24 hours) at 08/19/2024 1636 Last data filed at 08/19/2024 1552 Gross per 24 hour  Intake 1519.53 ml  Output --  Net 1519.53 ml     Physical Exam  General exam: Appears calm and comfortable.  Looks chronically ill and deconditioned Respiratory system: Bilateral decreased breath sounds at bases with scattered crackles and some wheezing Cardiovascular system: S1 & S2 heard, Rate controlled currently Gastrointestinal system: Abdomen is nondistended, soft and nontender. Normal bowel sounds  heard. Extremities: No cyanosis, clubbing; bilateral lower extremity edema present Central nervous system: Alert and oriented. No focal neurological deficits. Moving extremities Skin: No rashes, lesions or ulcers Psychiatry: Judgement and insight appear normal. Mood & affect appropriate.    RN pressure injury documentation:      Data Review:    Recent Labs  Lab 08/18/24 1635 08/19/24 0541  WBC 11.8* 5.8  HGB 11.2* 10.8*  HCT 37.0* 35.6*  PLT 175 140*  MCV 91.8 92.0  MCH 27.8 27.9  MCHC 30.3 30.3  RDW 14.6 14.6  LYMPHSABS 0.8  --   MONOABS 0.7  --   EOSABS 0.0  --   BASOSABS 0.0  --     Recent Labs  Lab 08/18/24 1626 08/18/24 1635 08/19/24 0541  NA  --  140 139  K  --  4.9 5.6*  CL  --  101 103  CO2  --  30 29  ANIONGAP  --  9 7  GLUCOSE  --  121* 161*  BUN  --  32* 33*  CREATININE  --  1.28* 1.04  AST  --   --  18  ALT  --   --  16  ALKPHOS  --   --  126  BILITOT  --   --  0.4  ALBUMIN  --   --  3.8  LATICACIDVEN 0.7  --   --   MG  --   --  2.5*  CALCIUM   --  9.1 9.2      Recent Labs  Lab 08/18/24 1626 08/18/24 1635 08/19/24 0541  LATICACIDVEN 0.7  --   --   MG  --   --  2.5*  CALCIUM   --  9.1 9.2    --------------------------------------------------------------------------------------------------------------- Lab Results  Component Value Date   CHOL 162 08/02/2024   HDL 49 08/02/2024   LDLCALC 104 (H) 08/02/2024   LDLDIRECT 99 05/18/2023   TRIG 39 08/02/2024   CHOLHDL 3.3 08/02/2024    Lab Results  Component Value Date   HGBA1C 5.6 03/01/2024   No results for input(s): TSH, T4TOTAL, FREET4, T3FREE, THYROIDAB in the last 72 hours. No results for input(s): VITAMINB12, FOLATE, FERRITIN, TIBC, IRON , RETICCTPCT in the last 72 hours. ------------------------------------------------------------------------------------------------------------------ Cardiac Enzymes No results for input(s): CKMB, TROPONINI,  MYOGLOBIN in the last 168 hours.  Invalid input(s): CK  Micro Results Recent Results (from the past 240 hours)  Resp panel by RT-PCR (RSV, Flu A&B, Covid) Anterior Nasal Swab     Status: None   Collection Time: 08/18/24  4:20 PM   Specimen: Anterior Nasal Swab  Result Value Ref Range Status   SARS Coronavirus 2 by RT PCR NEGATIVE NEGATIVE Final    Comment: (NOTE) SARS-CoV-2 target nucleic acids are NOT DETECTED.  The SARS-CoV-2 RNA is generally detectable in upper respiratory specimens during the acute phase of infection. The lowest concentration of SARS-CoV-2 viral copies this assay can detect is 138 copies/mL. A negative result does not preclude SARS-Cov-2 infection and should not be used as the sole basis for treatment or other patient management decisions. A negative result may occur with  improper specimen collection/handling, submission of specimen other than nasopharyngeal swab, presence of viral mutation(s) within the areas targeted by this assay, and inadequate number of viral copies(<138 copies/mL). A negative result must be combined with clinical observations, patient history, and epidemiological information. The expected result is Negative.  Fact Sheet for Patients:  bloggercourse.com  Fact Sheet for Healthcare Providers:  seriousbroker.it  This test is no t yet approved or cleared by the [place]  FDA and  has been authorized for detection and/or diagnosis of SARS-CoV-2 by FDA under an Emergency Use Authorization (EUA). This EUA will remain  in effect (meaning this test can be used) for the duration of the COVID-19 declaration under Section 564(b)(1) of the Act, 21 U.S.C.section 360bbb-3(b)(1), unless the authorization is terminated  or revoked sooner.       Influenza A by PCR NEGATIVE NEGATIVE Final   Influenza B by PCR NEGATIVE NEGATIVE Final    Comment: (NOTE) The Xpert Xpress SARS-CoV-2/FLU/RSV  plus assay is intended as an aid in the diagnosis of influenza from Nasopharyngeal swab specimens and should not be used as a sole basis for treatment. Nasal washings and aspirates are unacceptable for Xpert Xpress SARS-CoV-2/FLU/RSV testing.  Fact Sheet for Patients: bloggercourse.com  Fact Sheet for  Healthcare Providers: seriousbroker.it  This test is not yet approved or cleared by the [place]  FDA and has been authorized for detection and/or diagnosis of SARS-CoV-2 by FDA under an Emergency Use Authorization (EUA). This EUA will remain in effect (meaning this test can be used) for the duration of the COVID-19 declaration under Section 564(b)(1) of the Act, 21 U.S.C. section 360bbb-3(b)(1), unless the authorization is terminated or revoked.     Resp Syncytial Virus by PCR NEGATIVE NEGATIVE Final    Comment: (NOTE) Fact Sheet for Patients: bloggercourse.com  Fact Sheet for Healthcare Providers: seriousbroker.it  This test is not yet approved or cleared by the [place]  FDA and has been authorized for detection and/or diagnosis of SARS-CoV-2 by FDA under an Emergency Use Authorization (EUA). This EUA will remain in effect (meaning this test can be used) for the duration of the COVID-19 declaration under Section 564(b)(1) of the Act, 21 U.S.C. section 360bbb-3(b)(1), unless the authorization is terminated or revoked.  Performed at [facility], [address] [place], [place] [address]   Culture, blood (routine x 2)     Status: None (Preliminary result)   Collection Time: 08/18/24  4:20 PM   Specimen: BLOOD LEFT FOREARM  Result Value Ref Range Status   Specimen Description   Final    BLOOD LEFT FOREARM Performed at [facility], [address], [place], [place] [address]    Special Requests   Final    BOTTLES DRAWN AEROBIC AND ANAEROBIC  Blood Culture adequate volume Performed at [facility], [address], [place], [place] [address]    Culture   Final    NO GROWTH < 24 HOURS Performed at [facility], [address] [place], [place] [address]    Report Status PENDING  Incomplete  Respiratory (~20 pathogens) panel by PCR     Status: None   Collection Time: 08/18/24  4:20 PM   Specimen: Nasopharyngeal Swab; Respiratory  Result Value Ref Range Status   Adenovirus NOT DETECTED NOT DETECTED Final   Coronavirus 229E NOT DETECTED NOT DETECTED Final    Comment: (NOTE) The Coronavirus on the Respiratory Panel, DOES NOT test for the novel  Coronavirus (2019 nCoV)    Coronavirus HKU1 NOT DETECTED NOT DETECTED Final   Coronavirus NL63 NOT DETECTED NOT DETECTED Final   Coronavirus OC43 NOT DETECTED NOT DETECTED Final   Metapneumovirus NOT DETECTED NOT DETECTED Final   Rhinovirus / Enterovirus NOT DETECTED NOT DETECTED Final   Influenza A NOT DETECTED NOT DETECTED Final   Influenza B NOT DETECTED NOT DETECTED Final   Parainfluenza Virus 1 NOT DETECTED NOT DETECTED Final   Parainfluenza Virus 2 NOT DETECTED NOT DETECTED Final   Parainfluenza Virus 3 NOT DETECTED NOT DETECTED Final   Parainfluenza Virus 4 NOT DETECTED NOT DETECTED Final   Respiratory Syncytial Virus NOT DETECTED NOT DETECTED Final   Bordetella pertussis NOT DETECTED NOT DETECTED Final   Bordetella Parapertussis NOT DETECTED NOT DETECTED Final   Chlamydophila pneumoniae NOT DETECTED NOT DETECTED Final   Mycoplasma pneumoniae NOT DETECTED NOT DETECTED Final    Comment: Performed at [facility], [address], [place], [place] [address]  Culture, blood (routine x 2)     Status: None (Preliminary result)   Collection Time: 08/18/24  6:11 PM   Specimen: BLOOD RIGHT ARM  Result Value Ref Range Status   Specimen Description   Final    BLOOD RIGHT ARM ANAEROBIC BOTTLE  ONLY Performed at [facility], [address] [place], [place] [address]    Special Requests   Final    BOTTLES DRAWN AEROBIC AND ANAEROBIC Blood Culture adequate volume Performed at [facility], [address] [place], [place] [address]    Culture   Final    NO GROWTH < 12 HOURS Performed at [facility], [address], [place], [place] [address]    Report Status PENDING  Incomplete    Radiology Reports DG Chest 2 View Result Date: 08/18/2024 EXAM: 2 VIEW(S) XRAY OF THE CHEST 08/18/2024 01:50:00 PM COMPARISON: 04/17/2024 CLINICAL HISTORY: cough FINDINGS: LINES, TUBES AND DEVICES: Right chest port in place. LUNGS AND PLEURA: Stable loculated fluid collection and consolidation at the right lung apex. New airspace disease in the left lower lobe and lingula concerning for pneumonia. Increased fluid along the right fissure versus atelectasis. No pleural effusion. No pneumothorax. HEART AND MEDIASTINUM: No acute abnormality of the cardiac and mediastinal silhouettes. BONES AND SOFT TISSUES: No acute osseous abnormality. IMPRESSION: 1. New airspace disease in the left lower lobe and lingula, concerning for pneumonia. 2. Stable loculated fluid collection and consolidation at the right lung apex. 3. Increased fluid along the right fissure versus atelectasis. Electronically signed by: Norleen Boxer MD 08/18/2024 02:22 PM EST RP Workstation: [id number]    Hospital at Home Admission Criteria Checklist:  Formal consent explained in detail and signed at the bedside: yes Patient meets inpatient admission criteria (see below for further details) yes Is pt Medicare FFS/[employer] Medicare-Medicaid, [employer], [employer] ( required for initial launch with plan to expand)? yes Lives within 25 mil/ 30 min from [facility] within [place](pt may stay with family member during admission who lives within 25 miles or 30 min from [facility] w/in [facility])? yes Hemodynamically stable with relatively low risk of clinical  deterioration-not requiring ICU? yes Age >55? yes Does not require frequent touch-points or complex interventions/medications (ie Titrated Infusions (IV insulin, heparin  drips, vasoactive drips, use of infused or injectable controlled substances, patients on insulin)? yes Any Behavioral Health comorbidities likely to increase risk for in-home care (ie Acute delirium or experiencing a marked altered mental status and cause is not a treatable condition in the home)? no Has the patient been on BIPAP during course of ED evaluation or hospitalization? no IF YES, Has the patient been off of BIPAP for >24 hours(If NO-THEN PATIENT DOES MEET INCLUSION CRITERIA)? not applicable On Room Air or Needs oxygen  at home (<6L)? is not on home oxygen  therapy. Active safety concerns (ie Unable to use bedside commode independently and lacks caregiver support for safety- needs SNF placement, unable to obtain IV access)? no Has skin check been performed? yes  Has Physical Therapy screened the patient? yes  Common admission diagnoses including: CAP, COPD Exacerbation, Acute on chronic heart failure, Cellulitis, UTI , dehydration, acute resp failure with hypoxia (requiring <5L)   Social Screening:  - Has the family been directly contacted about Hospital at Home program with consent obtained (if yes- please document who was spoken to with name and phone number)? yes  -Was the family approached about the use of TOC pharmacy for medications at discharge? yes Denies significant ETOH intake? yes Does not smoke and understands may not smoke in the presence of oxygen ? yes Patient states able to use iPad/phone for communication/has family who is able to use? yes Patient has agreed to be compliant with medication and treatment regimen of the program? yes Any  active drug use in patient or primary caregiver including daily dosing of methadone? no Stable home environment ( access to appropriate heating in cold conditions and/or  appropriate air conditioning in hot conditions and/or no running water/electricity)? yes  No aggressive pets at home? no Firearm present? no  With ability or willingness to store them unloaded in a locked case for duration of hospitalization? yes Ambulatory? yes  no difficulty Bed bugs present on home evaluation? no Family support system in place? yes Patient feels safe at home and does not endorse any violence? yes Any actively decompensated behavioral health issues including agitation/aggressive behavior? no  Patient requests food to be provided by hospital home program? no PT/OT eval completed and not requiring SNF, ALF, inpatient rehab? yes  To be admitted to the [facility] program, a patient generally must meet the following: 1. Requirement for Inpatient Level of Care: The patient's condition must necessitate an inpatient level of care. This is typically indicated by one or more of the following, depending on their specific diagnosis:  Persistent tachycardia despite appropriate treatment (e.g., for Heart Failure, UTI). Persistent tachypnea (rapid breathing) or dyspnea (shortness of breath) that hasn't improved sufficiently with observation care (e.g., for Heart Failure, Pneumonia, Viral Illness, COVID). Hypoxemia (low oxygen  levels), such as a new need for oxygen , an increased need from baseline, or specific oxygen  saturation levels (e.g., SpO2 <90-94% depending on the condition) that persist despite observation (e.g., for Heart Failure, COPD, Pneumonia, Viral Illness, COVID). Need for Intravenous (IV) hydration due to an inability to maintain oral hydration, which persists despite observation care (e.g., for Cellulitis, UTI, Viral Illness, COVID). Specific to Heart Failure: Persistent pulmonary edema, indicated by a new oxygen  need, lack of improvement with IV diuretics, and ongoing tachypnea/dyspnea. Specific to COPD: A decrease in known baseline resting oxygen  saturation (SpO2) by 4%  or more, or an increase in pre-existing supplemental oxygen  requirements, which persists despite observation and requires continued close monitoring. Specific to Pneumonia: A Pneumonia Severity Index (PSI) class IV (moderate risk). Specific to Cellulitis: Failure of outpatient antibiotic therapy (indicated by progression or no improvement after a minimum of 48 hours on an adequate regimen) or a clinical presentation (like acuity or rapidity of progression) that requires the intensity of monitoring found in an inpatient setting. Specific to UTI: Persistence or worsening of clinical findings like fever, pain, or dehydration despite observation care; presence of significant uropathy; suspected infection of an indwelling prosthetic device, stent, implant, or graft; or pregnancy with suspected pyelonephritis.  2. Appropriateness for Hospital at Home Setting: The patient's overall clinical picture, including the severity of their illness, their care needs, and their medical history and comorbidities, must be suitable for management in the Hospital at Home environment. This essentially means that none of the exclusion criteria (listed below) are met.  Unified Exclusion Criteria for Hospital at Home Admission: A patient would not be eligible for Hospital at Home if any of the following are present: Hemodynamic Instability: Hypotension (low blood pressure) is present. Respiratory Instability or Needs Beyond Program Capability: There is a new need for invasive or noninvasive ventilatory assistance (like BiPAP or a ventilator). Oxygenation is not sufficient, generally indicated if an FiO2 (fraction of inspired oxygen ) of 45% (which is about 6 Liters/minute via nasal cannula) or more is required to keep oxygen  saturation (SpO2) at 90% or greater. Monitoring or Procedural Needs Beyond Program Capability: There is a need for invasive monitoring, such as a pulmonary artery catheter or an arterial line. There  is  a need for immediate-response telemetry monitoring (for dangerous arrhythmia detection and subsequent immediate intervention). The required medication regimen is beyond the capabilities of Hospital at Home (e.g., dosing intervals are too frequent for home administration). There is a need for a procedure that cannot be performed by the [facility] team (e.g., significant wound debridement or abscess drainage for cellulitis, or percutaneous nephrostomy for a complicated UTI). Significant Organ Dysfunction or Markers of Severe Illness: Mental status is not at baseline, or there is altered mental status suggestive of inadequate perfusion. Renal (kidney) function is unstable or showing an ongoing decline. There is evidence of inadequate perfusion, such as metabolic acidosis or myocardial ischemia. Uncompensated acidosis is present. Condition-Specific Severity or Complications Making Home Care Unsuitable: For Heart Failure: Known severe cardiac valvular disease (e.g., aortic stenosis, mitral regurgitation); or severe peripheral edema that impairs the ability to urinate or ambulate. For COPD: Known concurrent comorbidity or finding that indicates a higher-risk COPD exacerbation (e.g., pulmonary fibrosis, cavitation, pleural effusion, pneumothorax, rib fracture). For Pneumonia: Pneumonia Severity Index (PSI) class V (indicating high risk for inpatient mortality); known concurrent comorbidity or finding that indicates higher-risk pneumonia (e.g., pulmonary fibrosis, cavitation, large or loculated pleural effusion); or a concomitant serious infectious process like endocarditis or empyema. For Cellulitis: Orbital, periorbital, or necrotizing infection is suspected; or a concomitant serious infectious process like endocarditis, septic emboli, or septic joint space infection. For UTI: Urinary tract obstruction (e.g., kidney stone, bladder outlet obstruction); or a concomitant serious infectious process like  endocarditis or septic emboli. For Viral Illness & COVID-19: A concomitant serious infectious process like endocarditis or empyema.  General Comorbidities or Status:  The patient is significantly immunosuppressed (this applies to Pneumonia, Cellulitis, UTI, Viral Illness, and COVID-19). The patient meets inpatient admission criteria for a second diagnosis, or has care needs beyond the capabilities of [facility] due to an active clinically significant comorbidity. (This is a general exclusion across all listed conditions)    Signature  -   Claretta CHRISTELLA Alderman M.D on 08/19/2024 at 4:36 PM   -  To page go to [email or web address]

## 2024-08-19 NOTE — Progress Notes (Signed)
 Port access was attempted by [employer]. Was unable to flush or pull blood from it. IV team consult was done.

## 2024-08-19 NOTE — Care Management Obs Status (Signed)
 MEDICARE OBSERVATION STATUS NOTIFICATION   Patient Details  Name: [name] MRN: [id number] Date of Birth: 07-27-1951   Medicare Observation Status Notification Given:  Yes    Sonda Manuella Quill, RN 08/19/2024, 11:12 AM

## 2024-08-19 NOTE — Progress Notes (Signed)
 PROGRESS NOTE    [name]  FMW:[id number] DOB: 1950-10-12 DOA: 08/18/2024 PCP: Iven Lang DASEN, PA-C   Brief Narrative:  73 y.o. male with medical history significant of COPD, chronic respiratory failure with hypoxia using 2 L oxygen  by nasal cannula at night, chronic diastolic CHF, pulmonary hypertension, hypertension, hyperlipidemia GERD, BPH, depression, non-small cell cancer of right upper lobe status post SBRT, non-Hodgkin lymphoma and paroxysmal A-fib on Eliquis  presented with worsening shortness of breath.  On presentation, his oxygen  saturation was 75% on room air; he was put on 4 L oxygen  via nasal cannula.  RSV/influenza/COVID PCR negative.  Chest x-ray showed new airspace disease in the left lower lobe and lingula concerning for pneumonia. He was started on IV antibiotics and Solu-Medrol .   Assessment & Plan:   Community-acquired bacterial left sided pneumonia: Bacteria unspecified Acute on chronic hypoxic respiratory failure -Normally wears 2 L oxygen  via nasal cannula at night only.  Required 4 L on presentation.  Improving to 2 L this morning.  Imaging as above.  COVID/influenza/RSV PCR negative. - Continue Rocephin  and Zithromax .  Follow cultures - SLP eval   COPD exacerbation - Continue Solu-Medrol  and nebs.  Outpatient follow-up with pulmonary   Paroxysmal A-fib - Currently rate controlled.  Resume Eliquis .  Resume beta-blocker    Chronic diastolic heart failure - Currently compensated.  Strict input and output.  Daily weights.  Diuretics on hold because of mild AKI on presentation.    AKI - Improved with gentle hydration.  DC IV fluids.  Repeat a.m. labs   Leukocytosis - Resolved  Thrombocytopenia - Questionable cause.  No signs of bleeding.  Monitor intermittently  Hyperkalemia - Questionable cause.  Repeat a.m. labs   Anemia of chronic disease - From chronic illnesses.  Hemoglobin stable   Hypothyroidism Hyperlipidemia BPH - Resume home  regimen    Physical deconditioning - PT eval  non-small cell cancer of right upper lobe status post SBRT non-Hodgkin lymphoma status postchemotherapy, currently on observation - Outpatient follow-up with oncology    DVT prophylaxis: Eliquis  Code Status: Full Family Communication: Wife at bedside Disposition Plan: Status is: Observation The patient will require care spanning > 2 midnights and should be moved to inpatient because: Of severity of illness.  Need for IV antibiotics and steroids    Consultants: None  Procedures: None  Antimicrobials: Rocephin  and Zithromax  from 08/10/2024 onwards   Subjective: Patient seen and examined at bedside.  Feels slightly better but still short of breath with exertion with intermittent cough.  Does not feel ready to go home today.  No fever or vomiting reported.  Objective: Vitals:   08/18/24 2040 08/19/24 0153 08/19/24 0500 08/19/24 0850  BP:  126/77 135/73   Pulse:  71 68   Resp:  16 16   Temp:  97.8 F (36.6 C) (!) 97.5 F (36.4 C)   TempSrc:  Oral Oral   SpO2: 99% 97% 100% 95%  Weight:      Height:        Intake/Output Summary (Last 24 hours) at 08/19/2024 0951 Last data filed at 08/19/2024 0332 Gross per 24 hour  Intake 809.53 ml  Output --  Net 809.53 ml   Filed Weights   08/18/24 1703  Weight: 98.3 kg    Examination:  General exam: Appears calm and comfortable.  Looks chronically ill and deconditioned Respiratory system: Bilateral decreased breath sounds at bases with scattered crackles and some wheezing Cardiovascular system: S1 & S2 heard, Rate controlled currently Gastrointestinal  system: Abdomen is nondistended, soft and nontender. Normal bowel sounds heard. Extremities: No cyanosis, clubbing; bilateral lower extremity edema present Central nervous system: Alert and oriented. No focal neurological deficits. Moving extremities Skin: No rashes, lesions or ulcers Psychiatry: Judgement and insight appear  normal. Mood & affect appropriate.     Data Reviewed: I have personally reviewed following labs and imaging studies  CBC: Recent Labs  Lab 08/18/24 1635 08/19/24 0541  WBC 11.8* 5.8  NEUTROABS 10.4*  --   HGB 11.2* 10.8*  HCT 37.0* 35.6*  MCV 91.8 92.0  PLT 175 140*   Basic Metabolic Panel: Recent Labs  Lab 08/18/24 1635 08/19/24 0541  NA 140 139  K 4.9 5.6*  CL 101 103  CO2 30 29  GLUCOSE 121* 161*  BUN 32* 33*  CREATININE 1.28* 1.04  CALCIUM  9.1 9.2  MG  --  2.5*   GFR: Estimated Creatinine Clearance: 76.9 mL/min (by C-G formula based on SCr of 1.04 mg/dL). Liver Function Tests: Recent Labs  Lab 08/19/24 0541  AST 18  ALT 16  ALKPHOS 126  BILITOT 0.4  PROT 6.5  ALBUMIN 3.8   No results for input(s): LIPASE, AMYLASE in the last 168 hours. No results for input(s): AMMONIA in the last 168 hours. Coagulation Profile: No results for input(s): INR, PROTIME in the last 168 hours. Cardiac Enzymes: No results for input(s): CKTOTAL, CKMB, CKMBINDEX, TROPONINI in the last 168 hours. BNP (last 3 results) Recent Labs    08/18/24 1635  PROBNP 4,032.0*   HbA1C: No results for input(s): HGBA1C in the last 72 hours. CBG: No results for input(s): GLUCAP in the last 168 hours. Lipid Profile: No results for input(s): CHOL, HDL, LDLCALC, TRIG, CHOLHDL, LDLDIRECT in the last 72 hours. Thyroid  Function Tests: No results for input(s): TSH, T4TOTAL, FREET4, T3FREE, THYROIDAB in the last 72 hours. Anemia Panel: No results for input(s): VITAMINB12, FOLATE, FERRITIN, TIBC, IRON , RETICCTPCT in the last 72 hours. Sepsis Labs: Recent Labs  Lab 08/18/24 1626  LATICACIDVEN 0.7    Recent Results (from the past 240 hours)  Resp panel by RT-PCR (RSV, Flu A&B, Covid) Anterior Nasal Swab     Status: None   Collection Time: 08/18/24  4:20 PM   Specimen: Anterior Nasal Swab  Result Value Ref Range Status   SARS  Coronavirus 2 by RT PCR NEGATIVE NEGATIVE Final    Comment: (NOTE) SARS-CoV-2 target nucleic acids are NOT DETECTED.  The SARS-CoV-2 RNA is generally detectable in upper respiratory specimens during the acute phase of infection. The lowest concentration of SARS-CoV-2 viral copies this assay can detect is 138 copies/mL. A negative result does not preclude SARS-Cov-2 infection and should not be used as the sole basis for treatment or other patient management decisions. A negative result may occur with  improper specimen collection/handling, submission of specimen other than nasopharyngeal swab, presence of viral mutation(s) within the areas targeted by this assay, and inadequate number of viral copies(<138 copies/mL). A negative result must be combined with clinical observations, patient history, and epidemiological information. The expected result is Negative.  Fact Sheet for Patients:  bloggercourse.com  Fact Sheet for Healthcare Providers:  seriousbroker.it  This test is no t yet approved or cleared by the [place]  FDA and  has been authorized for detection and/or diagnosis of SARS-CoV-2 by FDA under an Emergency Use Authorization (EUA). This EUA will remain  in effect (meaning this test can be used) for the duration of the COVID-19 declaration under Section 564(b)(1) of  the Act, 21 U.S.C.section 360bbb-3(b)(1), unless the authorization is terminated  or revoked sooner.       Influenza A by PCR NEGATIVE NEGATIVE Final   Influenza B by PCR NEGATIVE NEGATIVE Final    Comment: (NOTE) The Xpert Xpress SARS-CoV-2/FLU/RSV plus assay is intended as an aid in the diagnosis of influenza from Nasopharyngeal swab specimens and should not be used as a sole basis for treatment. Nasal washings and aspirates are unacceptable for Xpert Xpress SARS-CoV-2/FLU/RSV testing.  Fact Sheet for  Patients: bloggercourse.com  Fact Sheet for Healthcare Providers: seriousbroker.it  This test is not yet approved or cleared by the [place]  FDA and has been authorized for detection and/or diagnosis of SARS-CoV-2 by FDA under an Emergency Use Authorization (EUA). This EUA will remain in effect (meaning this test can be used) for the duration of the COVID-19 declaration under Section 564(b)(1) of the Act, 21 U.S.C. section 360bbb-3(b)(1), unless the authorization is terminated or revoked.     Resp Syncytial Virus by PCR NEGATIVE NEGATIVE Final    Comment: (NOTE) Fact Sheet for Patients: bloggercourse.com  Fact Sheet for Healthcare Providers: seriousbroker.it  This test is not yet approved or cleared by the [place]  FDA and has been authorized for detection and/or diagnosis of SARS-CoV-2 by FDA under an Emergency Use Authorization (EUA). This EUA will remain in effect (meaning this test can be used) for the duration of the COVID-19 declaration under Section 564(b)(1) of the Act, 21 U.S.C. section 360bbb-3(b)(1), unless the authorization is terminated or revoked.  Performed at [facility], [address], [place], [place] [address]   Culture, blood (routine x 2)     Status: None (Preliminary result)   Collection Time: 08/18/24  4:20 PM   Specimen: BLOOD LEFT FOREARM  Result Value Ref Range Status   Specimen Description   Final    BLOOD LEFT FOREARM Performed at [facility], [address] [place], [place] [address]    Special Requests   Final    BOTTLES DRAWN AEROBIC AND ANAEROBIC Blood Culture adequate volume Performed at [facility], [address] [place], [place] [address]    Culture PENDING  Incomplete   Report Status PENDING  Incomplete  Respiratory (~20 pathogens) panel by PCR     Status: None   Collection  Time: 08/18/24  4:20 PM   Specimen: Nasopharyngeal Swab; Respiratory  Result Value Ref Range Status   Adenovirus NOT DETECTED NOT DETECTED Final   Coronavirus 229E NOT DETECTED NOT DETECTED Final    Comment: (NOTE) The Coronavirus on the Respiratory Panel, DOES NOT test for the novel  Coronavirus (2019 nCoV)    Coronavirus HKU1 NOT DETECTED NOT DETECTED Final   Coronavirus NL63 NOT DETECTED NOT DETECTED Final   Coronavirus OC43 NOT DETECTED NOT DETECTED Final   Metapneumovirus NOT DETECTED NOT DETECTED Final   Rhinovirus / Enterovirus NOT DETECTED NOT DETECTED Final   Influenza A NOT DETECTED NOT DETECTED Final   Influenza B NOT DETECTED NOT DETECTED Final   Parainfluenza Virus 1 NOT DETECTED NOT DETECTED Final   Parainfluenza Virus 2 NOT DETECTED NOT DETECTED Final   Parainfluenza Virus 3 NOT DETECTED NOT DETECTED Final   Parainfluenza Virus 4 NOT DETECTED NOT DETECTED Final   Respiratory Syncytial Virus NOT DETECTED NOT DETECTED Final   Bordetella pertussis NOT DETECTED NOT DETECTED Final   Bordetella Parapertussis NOT DETECTED NOT DETECTED Final   Chlamydophila pneumoniae NOT DETECTED NOT DETECTED Final   Mycoplasma  pneumoniae NOT DETECTED NOT DETECTED Final    Comment: Performed at [facility], [address], [place], [place] [address]         Radiology Studies: DG Chest 2 View Result Date: 08/18/2024 EXAM: 2 VIEW(S) XRAY OF THE CHEST 08/18/2024 01:50:00 PM COMPARISON: 04/17/2024 CLINICAL HISTORY: cough FINDINGS: LINES, TUBES AND DEVICES: Right chest port in place. LUNGS AND PLEURA: Stable loculated fluid collection and consolidation at the right lung apex. New airspace disease in the left lower lobe and lingula concerning for pneumonia. Increased fluid along the right fissure versus atelectasis. No pleural effusion. No pneumothorax. HEART AND MEDIASTINUM: No acute abnormality of the cardiac and mediastinal silhouettes. BONES AND SOFT TISSUES: No acute osseous  abnormality. IMPRESSION: 1. New airspace disease in the left lower lobe and lingula, concerning for pneumonia. 2. Stable loculated fluid collection and consolidation at the right lung apex. 3. Increased fluid along the right fissure versus atelectasis. Electronically signed by: Norleen Boxer MD 08/18/2024 02:22 PM EST RP Workstation: [id number]        Scheduled Meds:  apixaban   5 mg Oral BID   budesonide  (PULMICORT ) nebulizer solution  0.5 mg Nebulization Q12H   ipratropium-albuterol   3 mL Nebulization QID   methylPREDNISolone  (SOLU-MEDROL ) injection  40 mg Intravenous Q12H   Continuous Infusions:  azithromycin      cefTRIAXone  (ROCEPHIN )  IV 2 g (08/19/24 0845)          Sophie Mao, MD [facility] 08/19/2024, 9:51 AM

## 2024-08-19 NOTE — Progress Notes (Signed)
 Arrived to find the PT sitting upright in his bed, Aox4. PT is on supplemental O2 and stated that is his baseline at home as well, specifically more at night. PT denied being in any pain or difficulty breathing at the time.   PT was told of how the daily visits will work for the program and how he will be getting home from the hospital. All demographics, emergency contacts, pharmacy preference, medications, allergies, and phone numbers were all confirmed. PT has a port that he uses and was flushed 3 weeks ago. Dr. Lou perks for paramedic [facility] to access the port and for it to be used during the program.   PT was taken to IR by  RN [place].

## 2024-08-19 NOTE — Progress Notes (Signed)
 Port accessed by [employer]. PT was loaded into wheelchair with EMT [place].

## 2024-08-19 NOTE — Progress Notes (Signed)
 Patient Alert and oriented , taken to the IR by this nurse for a swallow test on a wheel chair with an O2 Tank . Arriving the elevator, patient  left foot got slightly  hit by the elevator door which tried to close before we could make our way in. Foot assessed and no visible injury on the feet or toes was observed. Patient asked if he felt like he had any trauma (sprain) on the toes and he refused partially annoyed over the incident. No further concerns for now.

## 2024-08-19 NOTE — Plan of Care (Signed)
  Problem: Clinical Measurements: Goal: Ability to maintain clinical measurements within normal limits will improve Outcome: Progressing   Problem: Clinical Measurements: Goal: Will remain free from infection Outcome: Progressing   Problem: Clinical Measurements: Goal: Respiratory complications will improve Outcome: Progressing   Problem: Nutrition: Goal: Adequate nutrition will be maintained Outcome: Progressing   Problem: Elimination: Goal: Will not experience complications related to bowel motility Outcome: Progressing Goal: Will not experience complications related to urinary retention Outcome: Progressing   Problem: Safety: Goal: Ability to remain free from injury will improve Outcome: Progressing   Problem: Skin Integrity: Goal: Risk for impaired skin integrity will decrease Outcome: Progressing   Problem: Respiratory: Goal: Levels of oxygenation will improve Outcome: Progressing   Problem: Respiratory: Goal: Ability to maintain a clear airway will improve Outcome: Progressing

## 2024-08-19 NOTE — Hospital Course (Signed)
 Potential admit for later today or tomorrow: [name] DOB 1951/07/01 MRN: [id number] Bed: [facility] 1605 Code status: Full Code [id number]  [facility] [place] [id number]  PMH: COPD, chronic respiratory failure with hypoxia using 2 L at night, chronic diastolic CHF, pulmonary hypertension, hypertension, hyperlipidemia GERD, BPH, depression, non-small cell cancer of right upper lobe s/p SBRT, non-Hodgkin lymphoma and paroxysmal A-fib on Eliquis   Presented with worsening shortness of breath and admitted for acute on chronic hypoxic respiratory failure due to currently acquired pneumonia and COPD exacerbation. Currently on 2 L [place].  Ambulatory at baseline, I have asked PT to see him. SLP was evaluated, he will be getting a modified barium swallow today.  On IV rocephin , IV azithro (will change to oral) and solumedrol. No narcotics.  Both patient and his wife (over the phone) verbally agreed. Primary doc has given the greenlight.  Any questions or comments?

## 2024-08-19 NOTE — TOC Initial Note (Signed)
 Transition of Care [facility]) - Initial/Assessment Note    Patient Details  Name: Jose HOLLINGSHED MRN: [id number] Date of Birth: 1951-06-11  Transition of Care [facility]) CM/SW Contact:    Sonda Manuella Quill, RN Phone Number: 08/19/2024, 11:33 AM  Clinical Narrative:                 [place] w/ pt in room; pt said he lives at home w/ his spouse Ysabel Cowgill [phone]); he plans to return w/ her support at d/c; she will provide transportation; pt verified insurance/PC; he denied SDOH risks; pt does not have DME or HH services; he has home oxygen  from [employer]; pt said he has a full travel tank that his spouse will bring at d/c; awaiting PT eval; IP CM will follow.  Expected Discharge Plan: Home/Self Care Barriers to Discharge: Continued Medical Work up   Patient Goals and CMS Choice Patient states their goals for this hospitalization and ongoing recovery are:: home     [place] ownership interest in [facility].provided to:: Patient    Expected Discharge Plan and Services   Discharge Planning Services: CM Consult   Living arrangements for the past 2 months: Single Family Home                 DME Arranged: N/A DME Agency: NA       HH Arranged: NA HH Agency: NA        Prior Living Arrangements/Services Living arrangements for the past 2 months: Single Family Home Lives with:: Spouse Patient language and need for interpreter reviewed:: Yes Do you feel safe going back to the place where you live?: Yes      Need for Family Participation in Patient Care: Yes (Comment) Care giver support system in place?: Yes (comment) Current home services: DME (home oxygen  from [employer]) Criminal Activity/Legal Involvement Pertinent to Current Situation/Hospitalization: No - Comment as needed  Activities of Daily Living   ADL Screening (condition at time of admission) Independently performs ADLs?: Yes (appropriate for developmental age) Is the patient deaf or have difficulty  hearing?: Yes Does the patient have difficulty seeing, even when wearing glasses/contacts?: No Does the patient have difficulty concentrating, remembering, or making decisions?: No  Permission Sought/Granted Permission sought to share information with : Case Manager Permission granted to share information with : Yes, Verbal Permission Granted  Share Information with NAME: Case Manager     Permission granted to share info w Relationship: Willim Turnage (spouse) [phone]     Emotional Assessment Appearance:: Appears stated age Attitude/Demeanor/Rapport: Gracious Affect (typically observed): Accepting Orientation: : Oriented to Self, Oriented to Place, Oriented to  Time, Oriented to Situation Alcohol  / Substance Use: Not Applicable Psych Involvement: No (comment)  Admission diagnosis:  Pneumonia [J18.9] COPD exacerbation (HCC) [J44.1] Acute respiratory failure with hypoxia (HCC) [J96.01] Community acquired pneumonia of left lower lobe of lung [J18.9] Patient Active Problem List   Diagnosis Date Noted   Pneumonia 08/18/2024   COPD exacerbation (HCC) 04/17/2024   Hyperkalemia 03/31/2024   Multifocal pneumonia 03/31/2024   Sepsis due to pneumonia (HCC) 03/30/2024   Statin myopathy 02/21/2024   Chronic constipation 02/21/2024   Primary osteoarthritis of left distal radioulnar joint 02/21/2024   Primary osteoarthritis of right distal radioulnar joint 01/28/2024   Idiopathic chronic gout of multiple sites without tophus 01/24/2024   Cubital tunnel syndrome on right 07/09/2023   COPD with acute exacerbation (HCC) 05/26/2023   Chronic diastolic CHF (congestive heart failure) (HCC) 05/07/2023   Acute  diastolic CHF (congestive heart failure) (HCC) 05/07/2023   Acute on chronic diastolic CHF (congestive heart failure) (HCC) 05/06/2023   Acute on chronic respiratory failure with hypoxia (HCC) 05/06/2023   DOE (dyspnea on exertion) 04/07/2023   Congestive heart failure (HCC) 01/26/2023    GERD (gastroesophageal reflux disease) 01/26/2023   Obesity 01/26/2023   Polycythemia 01/26/2023   Pulmonary hypertension (HCC) 01/26/2023   Aortic atherosclerosis 01/26/2023   Coronary artery calcification 01/26/2023   Carpal tunnel syndrome, right upper limb 11/03/2022   Lesion of ulnar nerve, right upper limb 11/03/2022   Numbness of right hand 06/28/2022   Status post bronchoscopy with biopsy    Port catheter in place 08/20/2016   COPD, severe (HCC) 11/27/2015   NHL (non-Hodgkin's lymphoma) (HCC) 09/25/2015   Lymphadenopathy syndrome 09/12/2015   Mediastinal adenopathy 09/12/2015   Chronic respiratory failure with hypoxia (HCC) 08/27/2015   Hypothyroidism 03/30/2012   Depression 12/03/2011   Pure hypercholesterolemia 12/03/2011   Atrial fibrillation (HCC) 10/26/2011   Other dysphagia 11/11/2010   Squamous cell carcinoma lung (HCC) 09/26/2010   BPH (benign prostatic hyperplasia) 2011   ERECTILE DYSFUNCTION 09/24/2008   Essential hypertension 06/05/2008   COPD (chronic obstructive pulmonary disease) (HCC) 06/05/2008   PCP:  Iven Lang DASEN, PA-C Pharmacy:   [facility] [address], [place] [address] [place] [facility] [place] [address] Phone: [phone] Fax: [phone]  [facility] [address] [place] [address] Phone: [phone] Fax: [phone]     Social Drivers of Health (SDOH) Social History: SDOH Screenings   Food Insecurity: No Food Insecurity (08/19/2024)  Housing: Low Risk  (08/19/2024)  Transportation Needs: No Transportation Needs (08/19/2024)  Utilities: Not At Risk (08/19/2024)  Alcohol  Screen: Low Risk  (04/13/2024)  Depression (PHQ2-9): Low Risk  (08/02/2024)  Financial Resource Strain: Low Risk  (08/01/2024)  Physical Activity: Inactive (08/01/2024)  Social Connections: Socially Integrated (08/18/2024)  Stress: No Stress Concern Present (08/01/2024)  Tobacco  Use: Medium Risk (08/18/2024)   SDOH Interventions: Food Insecurity Interventions: Intervention Not Indicated, Inpatient TOC Housing Interventions: Intervention Not Indicated, Inpatient TOC Transportation Interventions: Intervention Not Indicated, Inpatient TOC Utilities Interventions: Intervention Not Indicated, Inpatient TOC   Readmission Risk Interventions    04/21/2024   12:07 PM 03/31/2024   11:48 AM  Readmission Risk Prevention Plan  Transportation Screening Complete Complete  PCP or Specialist Appt within 3-5 Days  Complete  HRI or Home Care Consult  Complete  Social Work Consult for Recovery Care Planning/Counseling  Complete  Palliative Care Screening  Not Applicable  Medication Review Oceanographer) Complete Complete  PCP or Specialist appointment within 3-5 days of discharge Complete   HRI or Home Care Consult Complete   Palliative Care Screening Not Applicable   Skilled Nursing Facility Not Applicable

## 2024-08-19 NOTE — Evaluation (Signed)
 Clinical/Bedside Swallow Evaluation Patient Details  Name: [name] MRN: [id number] Date of Birth: June 03, 1951  Today's Date: 08/19/2024 Time: SLP Start Time (ACUTE ONLY): 1210 SLP Stop Time (ACUTE ONLY): 1223 SLP Time Calculation (min) (ACUTE ONLY): 13 min  Past Medical History:  Past Medical History:  Diagnosis Date   Acute diastolic CHF (congestive heart failure) (HCC) 05/07/2023   Acute on chronic diastolic CHF (congestive heart failure) (HCC) 05/06/2023   Acute on chronic respiratory failure with hypoxia (HCC) 05/06/2023   Aortic atherosclerosis 01/26/2023   Atrial fibrillation (HCC) 10/26/2011   BPH (benign prostatic hyperplasia) 2011   Carpal tunnel syndrome, right upper limb 11/03/2022   Chronic constipation 02/21/2024   Chronic diastolic CHF (congestive heart failure) (HCC) 05/07/2023   Chronic respiratory failure with hypoxia (HCC) 08/27/2015   Congestive heart failure (HCC)    COPD (chronic obstructive pulmonary disease) (HCC)    COPD exacerbation (HCC) 04/17/2024   COPD with acute exacerbation (HCC) 05/26/2023   COPD, severe (HCC) 11/27/2015   12/18/2015-pulmonary function test- FVC 2.75 (55% predicted), postbronchodilator ratio 56, postbronchodilator FEV1 1.66 (45% predicted), no significant bronchodilator response, mid flow reversibility, DLCO 33  >>>Severe obstructive airways disease, Severe diffusion defect        Coronary artery calcification 01/26/2023   Cubital tunnel syndrome on right 07/09/2023   Depression    DOE (dyspnea on exertion) 04/07/2023   Essential hypertension 06/05/2008   Qualifier: Diagnosis of   By: [name]), [name]       GERD (gastroesophageal reflux disease)    Hyperkalemia 03/31/2024   Hypothyroidism 03/30/2012   Idiopathic chronic gout of multiple sites without tophus 01/24/2024   Lesion of ulnar nerve, right upper limb 11/03/2022   Lymphadenopathy syndrome 09/12/2015   Mediastinal adenopathy 09/12/2015   Multifocal pneumonia  03/31/2024   NHL (non-Hodgkin's lymphoma) (HCC) 09/25/2015   Numbness of right hand 06/28/2022   Obesity    Other dysphagia 11/11/2010   Qualifier: Diagnosis of   By: [name], [name]       Polycythemia 01/26/2023   Port catheter in place 08/20/2016   Primary osteoarthritis of left distal radioulnar joint 02/21/2024   Primary osteoarthritis of right distal radioulnar joint 01/28/2024   Pulmonary hypertension (HCC)    Pure hypercholesterolemia 12/03/2011   Sepsis due to pneumonia (HCC) 03/30/2024   Squamous cell carcinoma lung (HCC) 09/26/2010   S/p LUL resection Dr Brantley dec 2011      Statin myopathy 02/21/2024   Status post bronchoscopy with biopsy    Past Surgical History:  Past Surgical History:  Procedure Laterality Date   APPENDECTOMY     BACK SURGERY     CARDIOVERSION  11/27/2011   Procedure: CARDIOVERSION;  Surgeon: Aleene JINNY Passe, MD;  Location: [facility] ENDOSCOPY;  Service: Cardiovascular;  Laterality: N/A;   COLONOSCOPY     COLONOSCOPY WITH PROPOFOL  N/A 09/07/2023   Procedure: COLONOSCOPY WITH PROPOFOL ;  Surgeon: Mahika Vanvoorhis Elspeth SQUIBB, MD;  Location: [facility] ENDOSCOPY;  Service: Gastroenterology;  Laterality: N/A;   FUDUCIAL PLACEMENT Right 10/13/2019   Procedure: Placement Of Fuducial;  Surgeon: Shelah Lamar RAMAN, MD;  Location: [facility] OR;  Service: Thoracic;  Laterality: Right;  Right Upper Lobe    LEFT HEART CATH AND CORONARY ANGIOGRAPHY N/A 04/12/2023   Procedure: LEFT HEART CATH AND CORONARY ANGIOGRAPHY;  Surgeon: Anner Alm ORN, MD;  Location: [facility] INVASIVE CV LAB;  Service: Cardiovascular;  Laterality: N/A;   LUNG CANCER SURGERY  2011   POLYPECTOMY  09/07/2023   Procedure: POLYPECTOMY;  Surgeon:  Armbruster, Elspeth SQUIBB, MD;  Location: [facility] ENDOSCOPY;  Service: Gastroenterology;;   UPPER GI ENDOSCOPY     VIDEO BRONCHOSCOPY WITH ENDOBRONCHIAL NAVIGATION N/A 10/13/2019   Procedure: VIDEO BRONCHOSCOPY WITH ENDOBRONCHIAL NAVIGATION;  Surgeon: Shelah Lamar RAMAN, MD;  Location: [facility] OR;   Service: Thoracic;  Laterality: N/A;   VIDEO BRONCHOSCOPY WITH ENDOBRONCHIAL ULTRASOUND N/A 10/13/2019   Procedure: VIDEO BRONCHOSCOPY WITH ENDOBRONCHIAL ULTRASOUND;  Surgeon: Shelah Lamar RAMAN, MD;  Location: [facility] OR;  Service: Thoracic;  Laterality: N/A;   HPI:  [name] is 73 y.o. male who presented 11/28 with worsening shortness of breath.  CXR 11/28 showed new airspace disease in the left lower lobe and lingula concerning for pneumonia.  Pt known to this service from prior admissions.  MBSS is 2017 showed intermittent laryngeal penetration of thin liquids and trace SILENT aspiration of sequential boluses of thin due to decreased laryngeal elevation/closure.  He has since been seen for clinical management of dysphagia but no further instrumental testing.  Pt reports this is his second case of pna this year.  Pt with medical history significant of COPD, chronic respiratory failure with hypoxia using 2 L oxygen  by nasal cannula at night, chronic diastolic CHF, pulmonary hypertension, hypertension, hyperlipidemia GERD, BPH, depression, non-small cell cancer of right upper lobe status post SBRT, non-Hodgkin lymphoma and paroxysmal A-fib on Eliquis     Assessment / Plan / Recommendation  Clinical Impression  Pt presents with clinical indicators of pharyngeal dysphagia with known hx silent aspiration per MBSS 2017.  Today pt was coughing with midday meal on SLP arrival. Pt tolerated bolus trials administered by SLP without difficulty.  He does not report any increased coughing with solids v liquids at home. He reports no changes to swallowing, but states he has been told that dysphagia with aspiration is the likely cause of his pneumonia.  Pt with GI hx with reflux and stasis of tablet on prior MBS.  SLP recommended consideration of EGD this year with GI follow up in december.  This has not yet taken place.  Given recurrent pna, known hx of aspiration, and clinical presentation today, recommend further  assessment of swallow function with MBSS.  Pt may continue current diet pending results of instrumental evaluation.   SLP Visit Diagnosis: Dysphagia, oropharyngeal phase (R13.12);Dysphagia, pharyngoesophageal phase (R13.14)    Aspiration Risk  Mild aspiration risk    Diet Recommendation Regular;Thin liquid    Liquid Administration via: Cup;Straw Medication Administration: Whole meds with liquid Supervision: Patient able to self feed Compensations: Slow rate;Small sips/bites;Clear throat intermittently Postural Changes: Seated upright at 90 degrees;Remain upright for at least 30 minutes after po intake    Other  Recommendations Oral Care Recommendations: Oral care BID     Assistance Recommended at Discharge    Functional Status Assessment  (TBD)  Frequency and Duration  (TBD)          Prognosis Prognosis for improved oropharyngeal function:  (TBD)      Swallow Study   General HPI: [name] is 73 y.o. male who presented 11/28 with worsening shortness of breath.  CXR 11/28 showed new airspace disease in the left lower lobe and lingula concerning for pneumonia.  Pt known to this service from prior admissions.  MBSS is 2017 showed intermittent laryngeal penetration of thin liquids and trace SILENT aspiration of sequential boluses of thin due to decreased laryngeal elevation/closure.  He has since been seen for clinical management of dysphagia but no further instrumental testing.  Pt reports this is  his second case of pna this year.  Pt with medical history significant of COPD, chronic respiratory failure with hypoxia using 2 L oxygen  by nasal cannula at night, chronic diastolic CHF, pulmonary hypertension, hypertension, hyperlipidemia GERD, BPH, depression, non-small cell cancer of right upper lobe status post SBRT, non-Hodgkin lymphoma and paroxysmal A-fib on Eliquis  Type of Study: Bedside Swallow Evaluation Previous Swallow Assessment: MBS 2017, most recent clinical 04/18/24 Diet  Prior to this Study: Regular;Thin liquids (Level 0) Respiratory Status: Nasal cannula History of Recent Intubation: No Behavior/Cognition: Alert;Cooperative Oral Cavity Assessment: Within Functional Limits Oral Care Completed by SLP: No Oral Cavity - Dentition: Missing dentition;Adequate natural dentition Vision: Functional for self-feeding Self-Feeding Abilities: Able to feed self Patient Positioning: Upright in bed Baseline Vocal Quality: Normal Volitional Cough: Strong Volitional Swallow: Unable to elicit    Oral/Motor/Sensory Function Overall Oral Motor/Sensory Function: Within functional limits Facial ROM: Within Functional Limits Facial Symmetry: Within Functional Limits Lingual ROM: Within Functional Limits Lingual Symmetry: Within Functional Limits Lingual Strength: Within Functional Limits Velum: Within Functional Limits Mandible: Within Functional Limits   Ice Chips Ice chips: Not tested   Thin Liquid Thin Liquid: Within functional limits Presentation: Cup;Straw    Nectar Thick Nectar Thick Liquid: Not tested   Honey Thick Honey Thick Liquid: Not tested   Puree Puree: Within functional limits Presentation: Self Fed   Solid     Solid: Within functional limits Presentation: Self Fed      Anette FORBES Grippe, MA, CCC-SLP Acute Rehabilitation Services Office: [phone] 08/19/2024,12:39 PM

## 2024-08-19 NOTE — Progress Notes (Signed)
 This EMT went to room 1605 at [facility] to transport the pt home for the [facility] program. Upon arrival top the room EMTP Huffman and RN [place] were in the room accessing the pt's port. This EMT waited outside in the hallway. Once the port was accessed this EMT entered the room and collected the pt's belongings and placed them into a pt belonging bag. The pt was AXO:4 and ready to go home. The pt got out of bed and got dressed. He was then placed on a portable concentrator for transport on 2LPM via [place]. He then ambulated to the [facility]. The pt was then taken out of the room and out to the [place]. He was then loaded into the [place]. The pt was then transported home. No incidents were reported during transport. During transport the pt remained on 02 at a rate of 2LPM via [place] with no incidents. Upon arrival to the home the pt was taken out of the [employer]. The pt them ambulated with no assistance into his home. Once in the home the pt sat on the couch and got comfortable. This EMT then set up [facility] equipment and explained all equipment to the pt and his wife. Vitals were obtained and documented in the flow chart. Virtual RN Edsel was contacted and verified all equipment was working properly and vitals were transmitted via tablet. EMTP Montalvo then arrived at the residence and finished the admission.

## 2024-08-19 NOTE — Evaluation (Signed)
 Physical Therapy Evaluation Patient Details Name: [name] MRN: [id number] DOB: [birth year] Today's Date: 08/19/2024  History of Present Illness  [name] is a 73 y.o. male with medical history significant of COPD, chronic respiratory failure with hypoxia using 2 L oxygen  by nasal cannula at night, chronic diastolic CHF, pulmonary hypertension, hypertension, hyperlipidemia GERD, BPH, depression, non-small cell cancer of right upper lobe status post SBRT, non-Hodgkin lymphoma and paroxysmal A-fib on Eliquis  presented with worsening shortness of breath  Clinical Impression  Pt admitted with above diagnosis. Pt in bed, agreeable to session. He states that he has been on 2L of O2 at home and will inc to 3L during mobility recently. Pt being considered for H@H . Pt able to demonstrate functional sequence of supine to standing and progresses amb with PT to 468ft with mod I, uses O2 tank and PT follows with IV pole. O2 sats drop to 86% with amb on 3L, adjusted [place] to improve flow and sats return to 90%+. Based on current function, skilled PT not indicated at this time and demonstrates the ability to be successful per home setup. Discussed with MD post session.          If plan is discharge home, recommend the following:     Can travel by private vehicle        Equipment Recommendations None recommended by PT  Recommendations for Other Services       Functional Status Assessment Patient has had a recent decline in their functional status and demonstrates the ability to make significant improvements in function in a reasonable and predictable amount of time.     Precautions / Restrictions Precautions Precautions: Fall Recall of Precautions/Restrictions: Intact Restrictions Weight Bearing Restrictions Per Provider Order: No      Mobility  Bed Mobility Overal bed mobility: Independent               Patient Response: Cooperative  Transfers Overall transfer level:  Independent                      Ambulation/Gait Ambulation/Gait assistance: Independent Gait Distance (Feet): 400 Feet Assistive device: IV Pole (IV pole follow, pt uses O2 tank) Gait Pattern/deviations: Step-through pattern, WFL(Within Functional Limits) Gait velocity: dec     General Gait Details: Pt demonstrates good control and balance with functional mobility in the hallway, O2 sats dec to 86% on 3L but improve with adjustment to [place], 90% +. Pt assist for lines at this time  Stairs Stairs: Yes     Number of Stairs: 3 General stair comments: Pt does not complete stairs this day, but demonstrates requisite strength, stability and activity tolerance to complete task per home setup  Wheelchair Mobility     Tilt Bed Tilt Bed Patient Response: Cooperative  Modified Rankin (Stroke Patients Only)       Balance Overall balance assessment: No apparent balance deficits (not formally assessed)                                           Pertinent Vitals/Pain Pain Assessment Pain Assessment: No/denies pain    Home Living Family/patient expects to be discharged to:: Private residence Living Arrangements: Spouse/significant other Available Help at Discharge: Family;Available 24 hours/day Type of Home: House Home Access: Stairs to enter Entrance Stairs-Rails: Left;Right;Can reach both Entrance Stairs-Number of Steps: 3   Home Layout: One  level Home Equipment: None      Prior Function Prior Level of Function : Independent/Modified Independent             Mobility Comments: 2L at night, has been using 3L for mobility       Extremity/Trunk Assessment   Upper Extremity Assessment Upper Extremity Assessment: Overall WFL for tasks assessed    Lower Extremity Assessment Lower Extremity Assessment: Overall WFL for tasks assessed       Communication   Communication Communication: No apparent difficulties    Cognition Arousal:  Alert Behavior During Therapy: WFL for tasks assessed/performed   PT - Cognitive impairments: No apparent impairments                         Following commands: Intact       Cueing Cueing Techniques: Verbal cues     General Comments      Exercises     Assessment/Plan    PT Assessment Patient does not need any further PT services  PT Problem List         PT Treatment Interventions      PT Goals (Current goals can be found in the Care Plan section)       Frequency       Co-evaluation               AM-PAC PT 6 Clicks Mobility  Outcome Measure Help needed turning from your back to your side while in a flat bed without using bedrails?: None Help needed moving from lying on your back to sitting on the side of a flat bed without using bedrails?: None Help needed moving to and from a bed to a chair (including a wheelchair)?: None Help needed standing up from a chair using your arms (e.g., wheelchair or bedside chair)?: None Help needed to walk in hospital room?: None Help needed climbing 3-5 steps with a railing? : None 6 Click Score: 24    End of Session Equipment Utilized During Treatment: Gait belt Activity Tolerance: Patient tolerated treatment well Patient left: in bed;with call bell/phone within reach Nurse Communication: Mobility status PT Visit Diagnosis: Other abnormalities of gait and mobility (R26.89)    Time: 8757-8692 PT Time Calculation (min) (ACUTE ONLY): 25 min   Charges:   PT Evaluation $PT Eval Low Complexity: 1 Low PT Treatments $Gait Training: 8-22 mins PT General Charges $$ ACUTE PT VISIT: 1 Visit         Jose PT Acute Rehabilitation Services Office: [phone] 08/19/2024   [name] 08/19/2024, 2:11 PM

## 2024-08-19 NOTE — Plan of Care (Signed)
  Problem: Education: Goal: Knowledge of General Education information will improve Description: Including pain rating scale, medication(s)/side effects and non-pharmacologic comfort measures Outcome: Progressing   Problem: Health Behavior/Discharge Planning: Goal: Ability to manage health-related needs will improve Outcome: Progressing   Problem: Clinical Measurements: Goal: Ability to maintain clinical measurements within normal limits will improve Outcome: Progressing Goal: Will remain free from infection Outcome: Progressing Goal: Diagnostic test results will improve Outcome: Progressing Goal: Respiratory complications will improve Outcome: Progressing Goal: Cardiovascular complication will be avoided Outcome: Progressing   Problem: Activity: Goal: Risk for activity intolerance will decrease Outcome: Progressing   Problem: Nutrition: Goal: Adequate nutrition will be maintained Outcome: Progressing   Problem: Elimination: Goal: Will not experience complications related to bowel motility Outcome: Progressing Goal: Will not experience complications related to urinary retention Outcome: Progressing   Problem: Pain Managment: Goal: General experience of comfort will improve and/or be controlled Outcome: Progressing   Problem: Skin Integrity: Goal: Risk for impaired skin integrity will decrease Outcome: Progressing   Problem: Education: Goal: Knowledge of disease or condition will improve Outcome: Progressing Goal: Knowledge of the prescribed therapeutic regimen will improve Outcome: Progressing Goal: Individualized Educational Video(s) Outcome: Progressing   Problem: Activity: Goal: Ability to tolerate increased activity will improve Outcome: Progressing Goal: Will verbalize the importance of balancing activity with adequate rest periods Outcome: Progressing   Problem: Respiratory: Goal: Ability to maintain a clear airway will improve Outcome:  Progressing Goal: Levels of oxygenation will improve Outcome: Progressing Goal: Ability to maintain adequate ventilation will improve Outcome: Progressing   Problem: Clinical Measurements: Goal: Ability to maintain a body temperature in the normal range will improve Outcome: Progressing   Problem: Respiratory: Goal: Ability to maintain adequate ventilation will improve Outcome: Progressing Goal: Ability to maintain a clear airway will improve Outcome: Progressing   Problem: Respiratory: Goal: Ability to maintain a clear airway will improve Outcome: Progressing

## 2024-08-19 NOTE — Plan of Care (Signed)
  Problem: Activity: Goal: Risk for activity intolerance will decrease Outcome: Progressing   Problem: Pain Managment: Goal: General experience of comfort will improve and/or be controlled Outcome: Progressing   Problem: Skin Integrity: Goal: Risk for impaired skin integrity will decrease Outcome: Progressing   Problem: Respiratory: Goal: Ability to maintain a clear airway will improve Outcome: Progressing

## 2024-08-20 DIAGNOSIS — R5381 Other malaise: Secondary | ICD-10-CM | POA: Diagnosis present

## 2024-08-20 DIAGNOSIS — J441 Chronic obstructive pulmonary disease with (acute) exacerbation: Secondary | ICD-10-CM

## 2024-08-20 DIAGNOSIS — I10 Essential (primary) hypertension: Secondary | ICD-10-CM

## 2024-08-20 DIAGNOSIS — J9621 Acute and chronic respiratory failure with hypoxia: Secondary | ICD-10-CM

## 2024-08-20 DIAGNOSIS — N179 Acute kidney failure, unspecified: Secondary | ICD-10-CM

## 2024-08-20 LAB — COMPREHENSIVE METABOLIC PANEL WITH GFR
ALT: 16 U/L (ref 0–44)
AST: 17 U/L (ref 15–41)
Albumin: 3.1 g/dL — ABNORMAL LOW (ref 3.5–5.0)
Alkaline Phosphatase: 88 U/L (ref 38–126)
Anion gap: 12 (ref 5–15)
BUN: 31 mg/dL — ABNORMAL HIGH (ref 8–23)
CO2: 28 mmol/L (ref 22–32)
Calcium: 8.7 mg/dL — ABNORMAL LOW (ref 8.9–10.3)
Chloride: 103 mmol/L (ref 98–111)
Creatinine, Ser: 0.9 mg/dL (ref 0.61–1.24)
GFR, Estimated: >60 mL/min (ref >60)
Glucose, Bld: 108 mg/dL — ABNORMAL HIGH (ref 70–99)
Potassium: 4.2 mmol/L (ref 3.5–5.1)
Sodium: 143 mmol/L (ref 135–145)
Total Bilirubin: 0.3 mg/dL (ref 0.0–1.2)
Total Protein: 5.5 g/dL — ABNORMAL LOW (ref 6.5–8.1)

## 2024-08-20 LAB — CBC
HCT: 32.5 % — ABNORMAL LOW (ref 39.0–52.0)
Hemoglobin: 10.1 g/dL — ABNORMAL LOW (ref 13.0–17.0)
MCH: 28.1 pg (ref 26.0–34.0)
MCHC: 31.1 g/dL (ref 30.0–36.0)
MCV: 90.3 fL (ref 80.0–100.0)
Platelets: 171 K/uL (ref 150–400)
RBC: 3.6 MIL/uL — ABNORMAL LOW (ref 4.22–5.81)
RDW: 14.5 % (ref 11.5–15.5)
WBC: 9.4 K/uL (ref 4.0–10.5)
nRBC: 0 % (ref 0.0–0.2)

## 2024-08-20 NOTE — Progress Notes (Signed)
 Phone call completed with patient. Introduced myself as the CHARITY FUNDRAISER for the shift and reminded patient that I can be reached via phone or tablet. Patient denies any pain or discomfort at this time. Plan made for a video call around 9:00 pm to administer bedtime medications.

## 2024-08-20 NOTE — Progress Notes (Signed)
 Arrived to find patient sittng in recliner. Patient in no distress and reported overall improvement.    Port assessed, flushed, infused and recapped w/ curos cap during visit.   Lungs clear upper with slight diminished sounds in lower lobes. Patient denied difficulty breahting and was on 2L [place].   Foot photographed and added to chart. Stated brusing had gone away and no pain other than feeling stretched.

## 2024-08-20 NOTE — Assessment & Plan Note (Signed)
 11/30 stable --Continue PPI

## 2024-08-20 NOTE — Assessment & Plan Note (Addendum)
 POA, resolved. 11/29 - Cr 1.04 improved from 1.28 11/30 - labs pending --Monitor BMP

## 2024-08-20 NOTE — Assessment & Plan Note (Signed)
 Baseline O2 requirement is 2 L/min at night, but using continuously since recent hospital admission.  Required 4 L on admission in setting of pneumonia and COPD exacerbation. Presented with worsening dyspnea. --Now stable on baseline 2 L/min continuous --Wean O2 as able --Mgmt of COPD and PNA as outlined

## 2024-08-20 NOTE — Progress Notes (Signed)
 Hospital at Home Daily Progress Note   Patient: [name]  MRN: [id number]  DOB: Feb 07, 1951  DOA: 08/18/2024  DOS: the patient was seen and examined on @TODAY @    Patient identified themself as [name]  DOB 05/03/1951  Medic Steffan Daring present in the home during encounter and performed the assessment and physical exam. Patient was seen today via video conference; my physical location [facility]   CC: worsening shortness of breath  Brief hospital course:  HPI from H&P 08/18/2024:  73 y.o. male with medical history significant of COPD, chronic respiratory failure with hypoxia using 2 L oxygen  by nasal cannula at night, chronic diastolic CHF, pulmonary hypertension, hypertension, hyperlipidemia GERD, BPH, depression, non-small cell cancer of right upper lobe status post SBRT, non-Hodgkin lymphoma and paroxysmal A-fib on Eliquis  presented with worsening shortness of breath.  On presentation, his oxygen  saturation was 75% on room air; he was put on 4 L oxygen  via nasal cannula.  RSV/influenza/COVID PCR negative. Chest x-ray showed new airspace disease in the left lower lobe and lingula concerning for pneumonia. He was started on IV antibiotics and Solu-Medrol .   11/29 -- patient transferred to [facility] program.  Continued on IV Rocphin, PO Zithromax , IV Solu-medrol .  Requiring 3 L/min nasal cannula O2.  11/30 -- pt feeling improved, nearly back to his baseline, subjectively improved.  Wheezing improved and improved air movement on exam.  On 2 L/min O2 with O2 sats in 90's.    Assessment and Plan:  * Pneumonia Left-sided, etiology community-acquired versus possible aspiration.  Viral PCR's were negative. --Continue IV Rocephin , PO Zithromax  --11/30 day 3 antibiotics --Follow cultures --Robitussin PRN --Mgmt of COPD as outlined --Monitor fever curve, CBC, and clinically for improvement  Hyperkalemia K 5.6 on 11/29 was treated with Lokelma  --Follow today's  BMP  COPD with acute exacerbation (HCC) In setting of pneumonia.   11/30 - improving in air movement, no wheezing this AM --Continue IV Solu-medrol  today --Likely transition to PO prednisone  tomorrow --Mgmt of PNA as outlined --Breztri  --PRN albuterol  nebs   Acute on chronic respiratory failure with hypoxia (HCC) Baseline O2 requirement is 2 L/min at night, but using continuously since recent hospital admission.  Required 4 L on admission in setting of pneumonia and COPD exacerbation. Presented with worsening dyspnea. --Now stable on baseline 2 L/min continuous --Wean O2 as able --Mgmt of COPD and PNA as outlined  COPD, severe (HCC) .  Chronic diastolic CHF (congestive heart failure) (HCC) 11/30 overall stable.   BNP was elevated on admission but no clinical signs of volume overload - supsect BNP up due to his chronic lung disease and malignancy --Monitor volume status closely --On Lasix  20 mg daily  Pulmonary hypertension (HCC) Noted.  Mgmt of respiratory failure, COPD and PNA as outlined. On 2 L/min O2 at baseline.  Squamous cell carcinoma lung (HCC) Follow up with Oncology  Essential hypertension 11/30 - BP's stable overall, 140/68 mildly elevated this AM --Continue amlodipine , metoprolol , Lasix   Chronic constipation 11/30 --Continue Linzess , Miralax  PRN  Statin myopathy Pt declines pivastatin for now. Has not yet picked up new Rx.  Follow up with prescriber as outpatient.  GERD (gastroesophageal reflux disease) 11/30 stable --Continue PPI  BPH (benign prostatic hyperplasia) 11/30 --Continue tamsulosin  and finasteride    Port catheter in place Noted.  NHL (non-Hodgkin's lymphoma) (HCC) Follow up with Oncology  Hypothyroidism --Continue levothyroxine   Pure hypercholesterolemia Continue Zetia  Pt wants to discuss newly prescribed statin (pivastatin) with prescriber before  starting it.  Physical deconditioning Evaluated by PT in hospital with no PT  follow up recommended.  AKI (acute kidney injury) POA, resolved. 11/29 - Cr 1.04 improved from 1.28 11/30 - labs pending --Monitor BMP  Gout 11/30 - stable --Continue allopurinol   Paroxysmal atrial fibrillation (HCC) 11/30 - HR's stable --Continue Eliquis  and metoprolol   Dysphagia Seen by SLP while in hospital. Underwent MBSS on 11/29. --SLP recommends regular diet, thin liquid --Pt takes meds in applesauce --Outpatient or HH SLP at discharge     Patient Active Problem List   Diagnosis Date Noted   Pneumonia 08/18/2024    Priority: High   Hyperkalemia 03/31/2024    Priority: High   COPD with acute exacerbation (HCC) 05/26/2023    Priority: High   Acute on chronic respiratory failure with hypoxia (HCC) 05/06/2023    Priority: High   COPD, severe (HCC) 11/27/2015    Priority: High   Chronic diastolic CHF (congestive heart failure) (HCC) 05/07/2023    Priority: Medium    Pulmonary hypertension (HCC) 01/26/2023    Priority: Medium    Squamous cell carcinoma lung (HCC) 09/26/2010    Priority: Medium    Essential hypertension 06/05/2008    Priority: Medium    Statin myopathy 02/21/2024    Priority: Low   Chronic constipation 02/21/2024    Priority: Low   GERD (gastroesophageal reflux disease) 01/26/2023    Priority: Low   Port catheter in place 08/20/2016    Priority: Low   NHL (non-Hodgkin's lymphoma) (HCC) 09/25/2015    Priority: Low   Hypothyroidism 03/30/2012    Priority: Low   Pure hypercholesterolemia 12/03/2011    Priority: Low   BPH (benign prostatic hyperplasia) 2011    Priority: Low   Physical deconditioning 08/20/2024   COPD exacerbation (HCC) 04/17/2024   Multifocal pneumonia 03/31/2024   Sepsis due to pneumonia (HCC) 03/30/2024   Primary osteoarthritis of left distal radioulnar joint 02/21/2024   Primary osteoarthritis of right distal radioulnar joint 01/28/2024   Idiopathic chronic gout of multiple sites without tophus 01/24/2024   Cubital  tunnel syndrome on right 07/09/2023   Acute diastolic CHF (congestive heart failure) (HCC) 05/07/2023   Acute on chronic diastolic CHF (congestive heart failure) (HCC) 05/06/2023   DOE (dyspnea on exertion) 04/07/2023   Congestive heart failure (HCC) 01/26/2023   Obesity 01/26/2023   Polycythemia 01/26/2023   Aortic atherosclerosis 01/26/2023   Coronary artery calcification 01/26/2023   Carpal tunnel syndrome, right upper limb 11/03/2022   Lesion of ulnar nerve, right upper limb 11/03/2022   Numbness of right hand 06/28/2022   Status post bronchoscopy with biopsy    AKI (acute kidney injury) 01/14/2016   Lymphadenopathy syndrome 09/12/2015   Mediastinal adenopathy 09/12/2015   Chronic respiratory failure with hypoxia (HCC) 08/27/2015   Gout 08/29/2012   Depression 12/03/2011   Paroxysmal atrial fibrillation (HCC) 10/26/2011   Dysphagia 11/11/2010   ERECTILE DYSFUNCTION 09/24/2008   COPD (chronic obstructive pulmonary disease) (HCC) 06/05/2008      Significant Events: Admitted 08/18/2024 Transferred to Hospital at [facility] 11/29     Subjective / Interval 24 hour History:  Pt seen virtually during AM medic visit to the home today. He reports overall feeling better, near 100% back to his baseline with his breathing.  Feels much less tight and wheezy today.  Reports O2 sats on his oximter in 90's on his 2 L/min O2.  He used to only use O2 at night but states has needed it continuously since most recent  hospital admission.  His foot that was bumped in the elevator yesterday, feels better today and bruising seems to have resolved.  He denies other complaints.      Antibiotic Therapy: Anti-infectives (From admission, onward)    Start     Dose/Rate Route Frequency Ordered Stop   08/20/24 1000  azithromycin  (ZITHROMAX ) tablet 500 mg        500 mg Oral Daily 08/19/24 1512 08/23/24 0959   08/20/24 1000  cefTRIAXone  (ROCEPHIN ) 2 g in sodium chloride  0.9 % 100 mL IVPB        2 g 200  mL/hr over 30 Minutes Intravenous Every 24 hours 08/19/24 1617 08/23/24 0959   08/19/24 1000  cefTRIAXone  (ROCEPHIN ) 2 g in sodium chloride  0.9 % 100 mL IVPB  Status:  Discontinued        2 g 200 mL/hr over 30 Minutes Intravenous Every 24 hours 08/18/24 1736 08/19/24 1617   08/19/24 1000  azithromycin  (ZITHROMAX ) 500 mg in sodium chloride  0.9 % 250 mL IVPB  Status:  Discontinued        500 mg 250 mL/hr over 60 Minutes Intravenous Every 24 hours 08/18/24 1736 08/19/24 1512   08/18/24 1615  cefTRIAXone  (ROCEPHIN ) 1 g in sodium chloride  0.9 % 100 mL IVPB        1 g 200 mL/hr over 30 Minutes Intravenous  Once 08/18/24 1606 08/18/24 1711   08/18/24 1615  azithromycin  (ZITHROMAX ) 500 mg in sodium chloride  0.9 % 250 mL IVPB        500 mg 250 mL/hr over 60 Minutes Intravenous  Once 08/18/24 1606 08/18/24 1819       Procedures: Modified barium swallow study  Consultants: None          Physical Exam:    08/20/2024   10:27 AM 08/19/2024    7:10 PM 08/19/2024    1:51 PM  Vitals with BMI  Weight  213 lbs 6 oz   BMI  28.94   Systolic 140 148 875  Diastolic 68 61 75  Pulse 82  74      General exam: awake, alert, no acute distress HEENT: moist mucus membranes, hearing grossly normal  Respiratory system: lung sounds diminished in bilateral bases but improved air movement, no wheezes or rhonchi, normal respiratory effort at rest on 2 L/min [place] O2. Nonproductive sounding cough Cardiovascular system:  no peripheral edema.   Central nervous system: A&O x3. no gross focal neurologic deficits, normal speech Skin: normal appearing facial skin on visualization by tablet Psychiatry: normal mood, congruent affect, judgement and insight appear normal   Left foot ecchymosis resolved, no bony tenderness on palpation            Data Reviewed:  Notable labs --  Today's labs are pending   Family Communication:   Disposition: Status is: Inpatient Remains inpatient appropriate  because: remains on IV antibiotics pending further imprvoement  Planned Discharge Destination: Home    Time spent: 45 minutes  Author: Burnard DELENA Cunning, DO [facility]  05/11/2024 7:00 AM  For on call review [email or web address].

## 2024-08-20 NOTE — Assessment & Plan Note (Signed)
 Pt declines pivastatin for now. Has not yet picked up new Rx.  Follow up with prescriber as outpatient.

## 2024-08-20 NOTE — Assessment & Plan Note (Signed)
 11/30 --Continue tamsulosin  and finasteride 

## 2024-08-20 NOTE — Assessment & Plan Note (Signed)
 Noted.  Mgmt of respiratory failure, COPD and PNA as outlined. On 2 L/min O2 at baseline.

## 2024-08-20 NOTE — Assessment & Plan Note (Signed)
 Left-sided, etiology community-acquired versus possible aspiration.  Viral PCR's were negative. --Continue IV Rocephin , PO Zithromax  --11/30 day 3 antibiotics --Follow cultures --Robitussin PRN --Mgmt of COPD as outlined --Monitor fever curve, CBC, and clinically for improvement

## 2024-08-20 NOTE — Assessment & Plan Note (Signed)
 11/30 - HR's stable --Continue Eliquis  and metoprolol 

## 2024-08-20 NOTE — Assessment & Plan Note (Signed)
 Seen by SLP while in hospital. Underwent MBSS on 11/29. --SLP recommends regular diet, thin liquid --Pt takes meds in applesauce --Outpatient or HH SLP at discharge

## 2024-08-20 NOTE — Progress Notes (Signed)
 The pt was admitted today to the hospital at home program. Upon arrival to the pt's house he was noted to be sitting in his living room with the EMT setting up his wearable device and tablet.The pt was alert and oriented x4. His skin was warm, dry and normal in color. He was wearing baseline 2 lpm VIA [place] ; and denied any SHOB or chest pain at this time.   The pt's home mediations had been found by his wife and same were looked over and placed in a non tamper bag. Medications were placed in the first overhead kitchen cabinet. As soon as you walk into the kitchen. It was noted that the pt take both Amlodipine  and Omeprazole  and these medications did not appear on his MAR nor was a order put in so they could be given by pharmacy.  It was further noted that the pt did not have his Pitavastain when asked about same he stated that he did not have a chance to pick it up from the pharmacy but, he states he does not want to take same because last time he was on a statin he states  I felt like I was dying. He wanted to know if it was possible speak to the doctor about same.   The pt's new medications were placed in their appropriate time slot and both the pt and his wife were informed about how to use his PRN medications and that they would need to call the virtual nurse.   The virtual nurse was called and his skin assessment was done. The pt was noted to have small hematomas along both his arms bit other obvious wounds or discolorations noted. The pt did have a incident while he was in the hospital where his left foot was hit getting into the elevator. Pms was intact, no obvious deformities were noted and no hematoma or swelling at this time were seen. A picture was taken of his foot and uploaded to his chart.    Vitals had been taken and are as noted. Lung sounds were ausculted and there was a minor expiratory wheeze with diminished lower lobes. The pt was assisted with his nighttime medications. The pt takes  his medications in apple sauce.   The pt was given a estimated time for his next visit but, was informed he could call at anytime if he needed any help. The pt and his wife agreed that they would.    The pt does sleep in his recliner in the living room.  They do have 3 small dogs in the home all friendly and, one larger dog that lives outside.

## 2024-08-20 NOTE — Assessment & Plan Note (Signed)
Follow up with Oncology

## 2024-08-20 NOTE — Assessment & Plan Note (Signed)
 SABRA

## 2024-08-20 NOTE — Assessment & Plan Note (Signed)
 11/30 overall stable.   BNP was elevated on admission but no clinical signs of volume overload - supsect BNP up due to his chronic lung disease and malignancy --Monitor volume status closely --On Lasix  20 mg daily

## 2024-08-20 NOTE — Assessment & Plan Note (Signed)
 11/30 - BP's stable overall, 140/68 mildly elevated this AM --Continue amlodipine , metoprolol , Lasix 

## 2024-08-20 NOTE — Assessment & Plan Note (Signed)
 Evaluated by PT in hospital with no PT follow up recommended.

## 2024-08-20 NOTE — Assessment & Plan Note (Signed)
 Continue Zetia  Pt wants to discuss newly prescribed statin (pivastatin) with prescriber before starting it.

## 2024-08-20 NOTE — Progress Notes (Signed)
 9454-- Outbound call to patient for scheduled medication administration assistance and Sp02 assessment. Sp02 is still no longer showing via Current Health data. Patient reached and reported currently not experiencing dyspnea or feeling hypoxic. Patient reported Sp02 per personal pulse oximeter is 97%. Patient agreed to video call for medication administration assistance.       0550---Patient contact via Video call.  Medication assistance provided for one scheduled self-administered medication with [employer].  Per Patient no other questions or concerns at the time of video call. Patient informed Virtual RN will continue to monitor for the remainder of shift. Patient made aware of oncoming RN and encouraged to continue to call as needed. [facility] contact information reviewed.

## 2024-08-20 NOTE — Assessment & Plan Note (Signed)
 11/30 - stable --Continue allopurinol 

## 2024-08-20 NOTE — Assessment & Plan Note (Signed)
 K 5.6 on 11/29 was treated with Lokelma  --Follow today's BMP

## 2024-08-20 NOTE — Progress Notes (Addendum)
 9189 called Pt O2 current health at 78%. Patient denies any distress or pain but requested a neb Tx this am. On virtually for patient self administered meds.  Patient wearing his O2 oximeter his reading is 96% and denies any distress, or shortness of breath. He also self-administered on empty stomach, with spouse assistance his Liness this am and reports he is swallowing improved. Patient also completed his Neb Tx.  Field team confirmed transporting last night ordered meds amlodipine  5mg , and Omerprazole 20mg .

## 2024-08-20 NOTE — Assessment & Plan Note (Signed)
 Noted

## 2024-08-20 NOTE — Progress Notes (Signed)
 Pt was sitting on the recliner watching TV on arrival. Stated that he was up walking around without his oxygen  without any distress. He denies any diff breathing now or pain. Vitals were assessed and Port was flushed and locked. He was also shown where his night meds were and the medicine he is supposed to take at 0600.

## 2024-08-20 NOTE — Assessment & Plan Note (Signed)
 11/30 --Continue Linzess , Miralax  PRN

## 2024-08-20 NOTE — Assessment & Plan Note (Signed)
 In setting of pneumonia.   11/30 - improving in air movement, no wheezing this AM --Continue IV Solu-medrol  today --Likely transition to PO prednisone  tomorrow --Mgmt of PNA as outlined --Breztri  --PRN albuterol  nebs

## 2024-08-20 NOTE — TOC Progression Note (Addendum)
 Transition of Care [facility]) - Progression Note    Patient Details  Name: [name] MRN: [id number] Date of Birth: 1951/02/24  [facility] of [facility]) CM/SW Contact  Corean JAYSON Canary, RN Phone Number: 08/20/2024, 9:53 AM  Clinical Narrative:     Patient transitioned to the hospital at home program. He has oxygen  and a nebulizer at home.  PT assessment revealed no HH needs, however some discussion of SLP either OP or HH.  IPCM will follow   Expected Discharge Plan: Home/Self Care Barriers to Discharge: Continued Medical Work up               Expected Discharge Plan and Services   Discharge Planning Services: CM Consult   Living arrangements for the past 2 months: Single Family Home                 DME Arranged: N/A DME Agency: NA       HH Arranged: NA HH Agency: NA         Social Drivers of Health (SDOH) Interventions SDOH Screenings   Food Insecurity: No Food Insecurity (08/19/2024)  Housing: Low Risk  (08/19/2024)  Transportation Needs: No Transportation Needs (08/19/2024)  Utilities: Not At Risk (08/19/2024)  Alcohol  Screen: Low Risk  (04/13/2024)  Depression (PHQ2-9): Low Risk  (08/02/2024)  Financial Resource Strain: Low Risk  (08/01/2024)  Physical Activity: Inactive (08/01/2024)  Social Connections: Socially Integrated (08/18/2024)  Stress: No Stress Concern Present (08/01/2024)  Tobacco Use: Medium Risk (08/19/2024)    Readmission Risk Interventions    04/21/2024   12:07 PM 03/31/2024   11:48 AM  Readmission Risk Prevention Plan  Transportation Screening Complete Complete  PCP or Specialist Appt within 3-5 Days  Complete  HRI or Home Care Consult  Complete  Social Work Consult for Recovery Care Planning/Counseling  Complete  Palliative Care Screening  Not Applicable  Medication Review Oceanographer) Complete Complete  PCP or Specialist appointment within 3-5 days of discharge Complete   HRI or Home Care Consult Complete    Palliative Care Screening Not Applicable   Skilled Nursing Facility Not Applicable

## 2024-08-20 NOTE — Plan of Care (Signed)
  Problem: Education: Goal: Knowledge of General Education information will improve Description: Including pain rating scale, medication(s)/side effects and non-pharmacologic comfort measures Outcome: Progressing   Problem: Health Behavior/Discharge Planning: Goal: Ability to manage health-related needs will improve Outcome: Progressing   Problem: Clinical Measurements: Goal: Ability to maintain clinical measurements within normal limits will improve Outcome: Progressing Goal: Respiratory complications will improve Outcome: Progressing   Problem: Nutrition: Goal: Adequate nutrition will be maintained Outcome: Progressing   Problem: Safety: Goal: Ability to remain free from injury will improve Outcome: Progressing   Problem: Skin Integrity: Goal: Risk for impaired skin integrity will decrease Outcome: Progressing   Problem: Activity: Goal: Will verbalize the importance of balancing activity with adequate rest periods Outcome: Progressing   Problem: Respiratory: Goal: Levels of oxygenation will improve Outcome: Progressing   Problem: Respiratory: Goal: Ability to maintain adequate ventilation will improve Outcome: Progressing Goal: Ability to maintain a clear airway will improve Outcome: Progressing

## 2024-08-20 NOTE — Assessment & Plan Note (Signed)
 Continue levothyroxine 

## 2024-08-20 NOTE — Progress Notes (Addendum)
 Patient visit with filed member, provider, and Tax Inspector. Provider put a hold on statin while at [facility] patient c/o weakness and overall feeling poorly. Provider educated on Pitavastatin  many patients are able to tolerate better patient expressed wants to hold till after discharge to discuss with his outpatient provider. Provider in discussion with patient put forth possible discharge tomorrow v Tues. Patient reports improved breathing feeling100%. Provider inspected left foot injured while at [employer], no visible brusing patient denies pain only some soreness. Refused ice pack. Field member denies any dual sign medications for review. Reported bP 142/77, p 71. Patient denies pain, chest pressure, shortness of breath, fever, or chills. O2 Sats at 96%.on 2 L O2 [place].

## 2024-08-21 MED ORDER — GUAIFENESIN 100 MG/5ML PO LIQD: 5.0000 mL | ORAL | Status: DC | PRN

## 2024-08-21 MED ORDER — CEFDINIR 300 MG PO CAPS: 300.0000 mg | ORAL_CAPSULE | Freq: Two times a day (BID) | ORAL | 0 refills | Status: AC

## 2024-08-21 MED ORDER — AZITHROMYCIN 500 MG PO TABS: 500.0000 mg | ORAL_TABLET | Freq: Every day | ORAL | 0 refills | Status: AC

## 2024-08-21 MED ORDER — PREDNISONE 10 MG PO TABS: ORAL_TABLET | ORAL | 0 refills | Status: AC

## 2024-08-21 MED ORDER — PREDNISONE 20 MG PO TABS
40.0000 mg | ORAL_TABLET | Freq: Every day | ORAL | Status: DC
  Administered 2024-08-21: 40 mg via ORAL
  Filled 2024-08-21 (×2): qty 2

## 2024-08-21 NOTE — Progress Notes (Signed)
 Patient states he is doing well this morning. No complaints of pain, headache, dizziness, or SOB. Oxygen  on at 2L. Plan for paramedic visit between [id number] today.

## 2024-08-21 NOTE — Discharge Summary (Signed)
 [facility] Physician Discharge Summary   Patient: [name] MRN: [id number] DOB: 01-Dec-1950  Admit date:     08/18/2024  Discharge date: 08/21/2024  Discharge Physician: Burnard DELENA Cunning   PCP: Iven Lang DASEN, PA-C    Patient identified themself as [name]  DOB 02/03/1951  Medic Lauraine Faes present in the home during encounter and performed the physical exam and assessment. Patient was seen today via video chat; my physical location [place], [place]    Recommendations at discharge:   Follow up with PCP in 1-2 weeks Repeat CBC, CMP at follow up Follow up on patient's recovery from PNA  Discharge Diagnoses: Principal Problem:   Pneumonia Active Problems:   COPD, severe (HCC)   Acute on chronic respiratory failure with hypoxia (HCC)   COPD with acute exacerbation (HCC)   Hyperkalemia   Essential hypertension   Squamous cell carcinoma lung (HCC)   Pulmonary hypertension (HCC)   Chronic diastolic CHF (congestive heart failure) (HCC)   Pure hypercholesterolemia   Hypothyroidism   NHL (non-Hodgkin's lymphoma) (HCC)   Port catheter in place   BPH (benign prostatic hyperplasia)   GERD (gastroesophageal reflux disease)   Statin myopathy   Chronic constipation   Dysphagia   Paroxysmal atrial fibrillation (HCC)   Gout   AKI (acute kidney injury)   Physical deconditioning  Resolved Problems:   * No resolved hospital problems. *  Hospital Course: HPI from H&P 08/18/2024:  73 y.o. male with medical history significant of COPD, chronic respiratory failure with hypoxia using 2 L oxygen  by nasal cannula at night, chronic diastolic CHF, pulmonary hypertension, hypertension, hyperlipidemia GERD, BPH, depression, non-small cell cancer of right upper lobe status post SBRT, non-Hodgkin lymphoma and paroxysmal A-fib on Eliquis  presented with worsening shortness of breath.  On presentation, his oxygen  saturation was 75% on room air; he was put on 4 L oxygen  via  nasal cannula.  RSV/influenza/COVID PCR negative. Chest x-ray showed new airspace disease in the left lower lobe and lingula concerning for pneumonia. He was started on IV antibiotics and Solu-Medrol .   11/29 -- patient transferred to [facility] program.  Continued on IV Rocphin, PO Zithromax , IV Solu-medrol .  Requiring 3 L/min nasal cannula O2.  11/30 -- pt feeling improved, nearly back to his baseline, subjectively improved.  Wheezing improved and improved air movement on exam.  On 2 L/min O2 with O2 sats in 90's.  12/1 - pt doing very well when seen this AM.  Feels at his baseline with his breathing, no F/C or other issues.  Patient is medically stable for discharge today & agreeable.   Discharge on 3 more days PO Zithromax  and Omnicef , prednisone  taper.  Follow up with PCP.   Assessment and Plan: * Pneumonia Left-sided, etiology community-acquired versus possible aspiration.  Viral PCR's were negative. --Continue IV Rocephin , PO Zithromax  --11/30 day 3 antibiotics --Follow cultures --Robitussin PRN --Mgmt of COPD as outlined --Monitor fever curve, CBC, and clinically for improvement  Hyperkalemia K 5.6 on 11/29 was treated with Lokelma  --Follow today's BMP  COPD with acute exacerbation (HCC) In setting of pneumonia.   11/30 - improving in air movement, no wheezing this AM --Continue IV Solu-medrol  today --Likely transition to PO prednisone  tomorrow --Mgmt of PNA as outlined --Breztri  --PRN albuterol  nebs   Acute on chronic respiratory failure with hypoxia (HCC) Baseline O2 requirement is 2 L/min at night, but using continuously since recent hospital admission.  Required 4 L on admission in setting  of pneumonia and COPD exacerbation. Presented with worsening dyspnea. --Now stable on baseline 2 L/min continuous --Wean O2 as able --Mgmt of COPD and PNA as outlined  COPD, severe (HCC) .  Chronic diastolic CHF (congestive heart failure) (HCC) 11/30 overall stable.    BNP was elevated on admission but no clinical signs of volume overload - supsect BNP up due to his chronic lung disease and malignancy --Monitor volume status closely --On Lasix  20 mg daily  Pulmonary hypertension (HCC) Noted.  Mgmt of respiratory failure, COPD and PNA as outlined. On 2 L/min O2 at baseline.  Squamous cell carcinoma lung (HCC) Follow up with Oncology  Essential hypertension 11/30 - BP's stable overall, 140/68 mildly elevated this AM --Continue amlodipine , metoprolol , Lasix   Chronic constipation 11/30 --Continue Linzess , Miralax  PRN  Statin myopathy Pt declines pivastatin for now. Has not yet picked up new Rx.  Follow up with prescriber as outpatient.  GERD (gastroesophageal reflux disease) 11/30 stable --Continue PPI  BPH (benign prostatic hyperplasia) 11/30 --Continue tamsulosin  and finasteride    Port catheter in place Noted.  NHL (non-Hodgkin's lymphoma) (HCC) Follow up with Oncology  Hypothyroidism --Continue levothyroxine   Pure hypercholesterolemia Continue Zetia  Pt wants to discuss newly prescribed statin (pivastatin) with prescriber before starting it.  Physical deconditioning Evaluated by PT in hospital with no PT follow up recommended.  AKI (acute kidney injury) POA, resolved. 11/29 - Cr 1.04 improved from 1.28 11/30 - labs pending --Monitor BMP  Gout 11/30 - stable --Continue allopurinol   Paroxysmal atrial fibrillation (HCC) 11/30 - HR's stable --Continue Eliquis  and metoprolol   Dysphagia Seen by SLP while in hospital. Underwent MBSS on 11/29. --SLP recommends regular diet, thin liquid --Pt takes meds in applesauce --Outpatient or HH SLP at discharge         Consultants: none Procedures performed: none  Disposition: Home Diet recommendation:  Regular diet DISCHARGE MEDICATION: Allergies as of 08/21/2024       Reactions   Hydrochlorothiazide  Other (See Comments)   Dried out the patient too much   Lipitor  [atorvastatin ] Other (See Comments)   Myalgias  Weakness  Fatigue    Omnipaque  [iohexol ] Itching, Other (See Comments)   When asked about contrast media today, pt stated that last time he had the contrast that he was red all over and itchy from head to toe.    Tape Other (See Comments)   Band-Aids irritate the skin        Medication List     TAKE these medications    albuterol  108 (90 Base) MCG/ACT inhaler Commonly known as: VENTOLIN  HFA Inhale 2 puffs into the lungs every 6 (six) hours as needed for wheezing or shortness of breath.   albuterol  (2.5 MG/3ML) 0.083% nebulizer solution Commonly known as: PROVENTIL  Take 3 mLs (2.5 mg total) by nebulization every 6 (six) hours as needed for wheezing or shortness of breath.   allopurinol  300 MG tablet Commonly known as: ZYLOPRIM  Take 300 mg by mouth daily.   amLODipine  5 MG tablet Commonly known as: NORVASC  Take 1 tablet (5 mg total) by mouth daily.   azelastine  0.1 % nasal spray Commonly known as: ASTELIN  Place 2 sprays into both nostrils 2 (two) times daily. Use in each nostril as directed   betamethasone  (augmented) 0.05 % lotion Commonly known as: DIPROLENE  Apply 1 Application topically daily as needed (dry skin on scalp).   budesonide -glycopyrrolate -formoterol  160-9-4.8 MCG/ACT Aero inhaler Commonly known as: BREZTRI  Inhale 2 puffs into the lungs 2 (two) times daily.   Buprenorphine HCl-Naloxone  HCl 8-2 MG Film Place 0.33-1 Film under the tongue 2 (two) times daily as needed (back pain).   ciclopirox  8 % solution Commonly known as: PENLAC  Apply topically at bedtime. Apply a thin coat over nail. Apply daily over previous coat. Remove weekly with nail polish remover. What changed:  when to take this reasons to take this   Eliquis  5 MG Tabs tablet Generic drug: apixaban  Take 1 tablet by mouth twice daily   ezetimibe  10 MG tablet Commonly known as: ZETIA  Take 10 mg by mouth daily.   finasteride  5 MG  tablet Commonly known as: PROSCAR  Take 5 mg by mouth at bedtime.   furosemide  20 MG tablet Commonly known as: LASIX  Take 1 tablet (20 mg total) by mouth daily. Take extra 20 mg for weight gain of 3 pounds a day or 5 pounds in a week.   Iron  325 (65 Fe) MG Tabs Take 1 tablet (325 mg total) by mouth daily. What changed: when to take this   levothyroxine  175 MCG tablet Commonly known as: SYNTHROID  Take 1 tablet (175 mcg total) by mouth daily before breakfast.   Linzess  290 MCG Caps capsule Generic drug: linaclotide  Take 1 capsule (290 mcg total) by mouth daily before breakfast.   Magnesium  Chloride 64 MG Tabs Take 64 mg by mouth daily in the afternoon. What changed: when to take this   metoprolol  succinate 25 MG 24 hr tablet Commonly known as: TOPROL -XL Take 1 tablet by mouth once daily   Ohtuvayre  3 MG/2.5ML Susp Generic drug: Ensifentrine  Inhale into the lungs as needed. NEB SOLUTION   omeprazole  20 MG capsule Commonly known as: PRILOSEC Take 1 capsule (20 mg total) by mouth 2 (two) times daily.   OXYGEN  Inhale 2 L/min into the lungs at bedtime. Inhale 2 L/min into the lungs at bedtime and as needed for shortness of breath during the daytime   Pitavastatin  Calcium  1 MG Tabs Take 1 tablet (1 mg total) by mouth daily. What changed: when to take this   sodium chloride  0.65 % Soln nasal spray Commonly known as: OCEAN Place 1 spray into both nostrils as needed for congestion.   sodium chloride  HYPERTONIC 3 % nebulizer solution Take by nebulization 2 (two) times daily. 3 mL 3% saline nebulizer twice daily   tamsulosin  0.4 MG Caps capsule Commonly known as: FLOMAX  Take 1 capsule (0.4 mg total) by mouth daily. What changed: when to take this   VITAMIN C  PO Take 1,200 mcg by mouth daily.   zolpidem  10 MG tablet Commonly known as: AMBIEN  Take 1 tablet (10 mg total) by mouth at bedtime as needed for sleep.       ASK your doctor about these medications     azithromycin  500 MG tablet Commonly known as: Zithromax  Take 1 tablet (500 mg total) by mouth daily for 3 days. Ask about: Should I take this medication?   cefdinir  300 MG capsule Commonly known as: OMNICEF  Take 1 capsule (300 mg total) by mouth 2 (two) times daily for 3 days. Ask about: Should I take this medication?   predniSONE  10 MG tablet Commonly known as: DELTASONE  Take 4 tablets (40 mg total) by mouth daily with breakfast for 1 day, THEN 3 tablets (30 mg total) daily with breakfast for 1 day, THEN 2 tablets (20 mg total) daily with breakfast for 1 day, THEN 1 tablet (10 mg total) daily with breakfast for 1 day. Start taking on: August 22, 2024 Ask about: Should I take this medication?  Follow-up Information     [facility] Follow up.   Contact information: [address] JEWELL DELENA ODESSIA KATHEE [place] [address] [phone]         [name], Lang DASEN, PA-C Follow up on 08/24/2024.   Specialty: Physician Assistant Why: Post hospital follow up scheduled for 08/24/2024 at 9:50 am with Dr. Lang Skelton Contact information: [address] [place] [address] [phone]                Discharge Exam: Fredricka Weights   08/18/24 1703 08/19/24 1910  Weight: 98.3 kg 96.8 kg   General exam: awake, alert, no acute distress HEENT: voice clear, moist mucus membranes, hearing grossly normal  Respiratory system: CTAB slightly diminished in bases bilaterally, no wheezes, on 2 L [place] O@, normal respiratory effort at rest. Central nervous system: A&O x4. no gross focal neurologic deficits, normal speech Extremities: foot photo below Skin: facial skin color appears normal on video Psychiatry: normal mood, congruent affect, judgement and insight appear normal          Condition at discharge: stable  The results of significant diagnostics from this hospitalization (including imaging, microbiology, ancillary and laboratory) are listed below for reference.    Imaging Studies: DG Swallowing Func-Speech Pathology Result Date: 08/19/2024 Table formatting from the original result was not included. Images from the original result were not included. Modified Barium Swallow Study Patient Details Name: EIDAN MUELLNER MRN: [id number] Date of Birth: [birth year] Today's Date: 08/19/2024 HPI/PMH: HPI: Treasure Ingrum is 73 y.o. male who presented 11/28 with worsening shortness of breath.  CXR 11/28 showed new airspace disease in the left lower lobe and lingula concerning for pneumonia.  Pt known to this service from prior admissions.  MBSS is 2017 showed intermittent laryngeal penetration of thin liquids and trace SILENT aspiration of sequential boluses of thin due to decreased laryngeal elevation/closure.  He has since been seen for clinical management of dysphagia but no further instrumental testing.  Pt reports this is his second case of pna this year.  Pt with medical history significant of COPD, chronic respiratory failure with hypoxia using 2 L oxygen  by nasal cannula at night, chronic diastolic CHF, pulmonary hypertension, hypertension, hyperlipidemia GERD, BPH, depression, non-small cell cancer of right upper lobe status post SBRT, non-Hodgkin lymphoma and paroxysmal A-fib on Eliquis  Clinical Impression: Pt presents with a mild oropharyngeal dysphagia c/b delayed swallow initiation, reduced base of tongue retraction, reduced hyolaryngeal elevation and excurision, absent epiglottic inversion (which may be 2/2 possible cervical osteophytes - see image below), incomplete laryngeal closure, and diminished sensation.  These deficits resulted in intermittent silent aspiration of thin liquids by cup.  With straw sip there was a greater volume of penetration/aspiration which was sensed.  Cued cough was beneficial to clear aspiration and reduced but did not fully clear penetration.  There was mild pharyngeal residue in vallecula, pyriform sinuses and a trace coating along  posterior pharyngeal wall.  On esophageal sweep during pill simulation there was retention and backflow of contrast within the esophagus and brief stasis of barium tablet.  Would recommend medications whole with puree.  Today's study is fairly consistent with results reported on MBSS in 2017, which is reasurring.  Pt would benefit from ST at next level of care to improve swallowing and possibly cough strength, to improve clearance of penetrant.  Recommend continuing regular diet with thin liquid by cup with regular throat clear. DIGEST Swallow Severity Rating*  Safety: 1  Efficiency: 1  Overall Pharyngeal  Swallow Severity: 1 1: mild; 2: moderate; 3: severe; 4: profound *The Dynamic Imaging Grade of Swallowing Toxicity is standardized for the head and neck cancer population, however, demonstrates promising clinical applications across populations to standardize the clinical rating of pharyngeal swallow safety and severity. Possible cervical osteophytes: Factors that may increase risk of adverse event in presence of aspiration Noe & Lianne 2021): No data recorded Recommendations/Plan: Swallowing Evaluation Recommendations Swallowing Evaluation Recommendations Recommendations: PO diet PO Diet Recommendation: Regular; Thin liquids (Level 0) Liquid Administration via: Cup; No straw Medication Administration: Whole meds with puree Supervision: Patient able to self-feed Swallowing strategies  : Slow rate; Small bites/sips; Clear throat intermittently Postural changes: Position pt fully upright for meals; Stay upright 30-60 min after meals Oral care recommendations: Oral care BID (2x/day) Treatment Plan Treatment Plan Treatment recommendations: Therapy as outlined in treatment plan below Follow-up recommendations: Outpatient SLP (or home health) Functional status assessment: Patient has had a recent decline in their functional status and demonstrates the ability to make significant improvements in function in a  reasonable and predictable amount of time. Treatment frequency: Min 2x/week Treatment duration: 2 weeks Interventions: Oropharyngeal exercises; Respiratory muscle strength training; Compensatory techniques Recommendations Recommendations for follow up therapy are one component of a multi-disciplinary discharge planning process, led by the attending physician.  Recommendations may be updated based on patient status, additional functional criteria and insurance authorization. Assessment: Orofacial Exam: Orofacial Exam Oral Cavity: Oral Hygiene: WFL Oral Cavity - Dentition: Missing dentition; Adequate natural dentition Orofacial Anatomy: WFL Oral Motor/Sensory Function: -- (See BSE) Anatomy: Anatomy: Suspected cervical osteophytes Boluses Administered: Boluses Administered Boluses Administered: Thin liquids (Level 0); Mildly thick liquids (Level 2, nectar thick); Moderately thick liquids (Level 3, honey thick); Puree; Solid  Oral Impairment Domain: Oral Impairment Domain Lip Closure: No labial escape Tongue control during bolus hold: Posterior escape of less than half of bolus Bolus preparation/mastication: Timely and efficient chewing and mashing Bolus transport/lingual motion: Brisk tongue motion Oral residue: Complete oral clearance Location of oral residue : N/A Initiation of pharyngeal swallow : Pyriform sinuses  Pharyngeal Impairment Domain: Pharyngeal Impairment Domain Soft palate elevation: No bolus between soft palate (SP)/pharyngeal wall (PW) Laryngeal elevation: Partial superior movement of thyroid  cartilage/partial approximation of arytenoids to epiglottic petiole Anterior hyoid excursion: Partial anterior movement (minimal) Epiglottic movement: No inversion Laryngeal vestibule closure: Incomplete, narrow column air/contrast in laryngeal vestibule Pharyngeal stripping wave : Present - diminished Pharyngeal contraction (A/P view only): N/A Pharyngoesophageal segment opening: Partial distention/partial  duration, partial obstruction of flow Tongue base retraction: Trace column of contrast or air between tongue base and PPW Pharyngeal residue: Collection of residue within or on pharyngeal structures Location of pharyngeal residue: Pharyngeal wall; Pyriform sinuses; Valleculae  Esophageal Impairment Domain: Esophageal Impairment Domain Esophageal clearance upright position: Esophageal retention with retrograde flow below pharyngoesophageal segment (PES) Pill: Pill Consistency administered: Thin liquids (Level 0) Penetration/Aspiration Scale Score: Penetration/Aspiration Scale Score 1.  Material does not enter airway: Mildly thick liquids (Level 2, nectar thick); Moderately thick liquids (Level 3, honey thick); Puree; Solid; Pill 8.  Material enters airway, passes BELOW cords without attempt by patient to eject out (silent aspiration) : Thin liquids (Level 0) Compensatory Strategies: Compensatory Strategies Compensatory strategies: Yes Straw: Ineffective Other(comment): Effective Effective Other(comment): Thin liquid (Level 0) (Cued cough and reswallow)   General Information: Caregiver present: No  Diet Prior to this Study: Regular; Thin liquids (Level 0)   No data recorded  Respiratory Status: WFL   No data recorded  History of  Recent Intubation: No  Behavior/Cognition: Alert; Cooperative Self-Feeding Abilities: Able to self-feed Baseline vocal quality/speech: Normal Volitional Cough: Able to elicit Volitional Swallow: Unable to elicit Exam Limitations: No limitations Goal Planning: Prognosis for improved oropharyngeal function: Good No data recorded No data recorded Patient/Family Stated Goal: not stated, agreeable to further assessment Consulted and agree with results and recommendations: Patient; Physician Pain: Pain Assessment Pain Assessment: No/denies pain End of Session: Start Time:SLP Start Time (ACUTE ONLY): 1530 Stop Time: SLP Stop Time (ACUTE ONLY): 1544 Time Calculation:SLP Time Calculation (min) (ACUTE  ONLY): 14 min Charges: SLP Evaluations $ SLP Speech Visit: 1 Visit SLP Evaluations $BSS Swallow: 1 Procedure $MBS Swallow: 1 Procedure SLP visit diagnosis: SLP Visit Diagnosis: Dysphagia, oropharyngeal phase (R13.12); Dysphagia, pharyngoesophageal phase (R13.14) Past Medical History: Past Medical History: Diagnosis Date  Acute diastolic CHF (congestive heart failure) (HCC) 05/07/2023  Acute on chronic diastolic CHF (congestive heart failure) (HCC) 05/06/2023  Acute on chronic respiratory failure with hypoxia (HCC) 05/06/2023  Aortic atherosclerosis 01/26/2023  Atrial fibrillation (HCC) 10/26/2011  BPH (benign prostatic hyperplasia) 2011  Carpal tunnel syndrome, right upper limb 11/03/2022  Chronic constipation 02/21/2024  Chronic diastolic CHF (congestive heart failure) (HCC) 05/07/2023  Chronic respiratory failure with hypoxia (HCC) 08/27/2015  Congestive heart failure (HCC)   COPD (chronic obstructive pulmonary disease) (HCC)   COPD exacerbation (HCC) 04/17/2024  COPD with acute exacerbation (HCC) 05/26/2023  COPD, severe (HCC) 11/27/2015  12/18/2015-pulmonary function test- FVC 2.75 (55% predicted), postbronchodilator ratio 56, postbronchodilator FEV1 1.66 (45% predicted), no significant bronchodilator response, mid flow reversibility, DLCO 33  >>>Severe obstructive airways disease, Severe diffusion defect       Coronary artery calcification 01/26/2023  Cubital tunnel syndrome on right 07/09/2023  Depression   DOE (dyspnea on exertion) 04/07/2023  Essential hypertension 06/05/2008  Qualifier: Diagnosis of   By: [name]), [name]      GERD (gastroesophageal reflux disease)   Hyperkalemia 03/31/2024  Hypothyroidism 03/30/2012  Idiopathic chronic gout of multiple sites without tophus 01/24/2024  Lesion of ulnar nerve, right upper limb 11/03/2022  Lymphadenopathy syndrome 09/12/2015  Mediastinal adenopathy 09/12/2015  Multifocal pneumonia 03/31/2024  NHL (non-Hodgkin's lymphoma) (HCC) 09/25/2015  Numbness of  right hand 06/28/2022  Obesity   Other dysphagia 11/11/2010  Qualifier: Diagnosis of   By: [name], [name]      Polycythemia 01/26/2023  Port catheter in place 08/20/2016  Primary osteoarthritis of left distal radioulnar joint 02/21/2024  Primary osteoarthritis of right distal radioulnar joint 01/28/2024  Pulmonary hypertension (HCC)   Pure hypercholesterolemia 12/03/2011  Sepsis due to pneumonia (HCC) 03/30/2024  Squamous cell carcinoma lung (HCC) 09/26/2010  S/p LUL resection Dr Brantley dec 2011     Statin myopathy 02/21/2024  Status post bronchoscopy with biopsy  Past Surgical History: Past Surgical History: Procedure Laterality Date  APPENDECTOMY    BACK SURGERY    CARDIOVERSION  11/27/2011  Procedure: CARDIOVERSION;  Surgeon: Aleene JINNY Passe, MD;  Location: [facility] ENDOSCOPY;  Service: Cardiovascular;  Laterality: N/A;  COLONOSCOPY    COLONOSCOPY WITH PROPOFOL  N/A 09/07/2023  Procedure: COLONOSCOPY WITH PROPOFOL ;  Surgeon: Leigh Elspeth SQUIBB, MD;  Location: [facility] ENDOSCOPY;  Service: Gastroenterology;  Laterality: N/A;  FUDUCIAL PLACEMENT Right 10/13/2019  Procedure: Placement Of Fuducial;  Surgeon: Shelah Lamar RAMAN, MD;  Location: [facility] OR;  Service: Thoracic;  Laterality: Right;  Right Upper Lobe   LEFT HEART CATH AND CORONARY ANGIOGRAPHY N/A 04/12/2023  Procedure: LEFT HEART CATH AND CORONARY ANGIOGRAPHY;  Surgeon: Anner Alm ORN, MD;  Location: [facility] INVASIVE CV  LAB;  Service: Cardiovascular;  Laterality: N/A;  LUNG CANCER SURGERY  2011  POLYPECTOMY  09/07/2023  Procedure: POLYPECTOMY;  Surgeon: Leigh Elspeth SQUIBB, MD;  Location: [facility] ENDOSCOPY;  Service: Gastroenterology;;  UPPER GI ENDOSCOPY    VIDEO BRONCHOSCOPY WITH ENDOBRONCHIAL NAVIGATION N/A 10/13/2019  Procedure: VIDEO BRONCHOSCOPY WITH ENDOBRONCHIAL NAVIGATION;  Surgeon: Shelah Lamar RAMAN, MD;  Location: [facility] OR;  Service: Thoracic;  Laterality: N/A;  VIDEO BRONCHOSCOPY WITH ENDOBRONCHIAL ULTRASOUND N/A 10/13/2019  Procedure: VIDEO BRONCHOSCOPY WITH ENDOBRONCHIAL  ULTRASOUND;  Surgeon: Shelah Lamar RAMAN, MD;  Location: [facility] OR;  Service: Thoracic;  Laterality: N/AMERL Anette BRAVO Borum 08/19/2024, 4:48 PM  DG Chest 2 View Result Date: 08/18/2024 EXAM: 2 VIEW(S) XRAY OF THE CHEST 08/18/2024 01:50:00 PM COMPARISON: 04/17/2024 CLINICAL HISTORY: cough FINDINGS: LINES, TUBES AND DEVICES: Right chest port in place. LUNGS AND PLEURA: Stable loculated fluid collection and consolidation at the right lung apex. New airspace disease in the left lower lobe and lingula concerning for pneumonia. Increased fluid along the right fissure versus atelectasis. No pleural effusion. No pneumothorax. HEART AND MEDIASTINUM: No acute abnormality of the cardiac and mediastinal silhouettes. BONES AND SOFT TISSUES: No acute osseous abnormality. IMPRESSION: 1. New airspace disease in the left lower lobe and lingula, concerning for pneumonia. 2. Stable loculated fluid collection and consolidation at the right lung apex. 3. Increased fluid along the right fissure versus atelectasis. Electronically signed by: Norleen Boxer MD 08/18/2024 02:22 PM EST RP Workstation: [id number]    Microbiology: Results for orders placed or performed during the hospital encounter of 08/18/24  Resp panel by RT-PCR (RSV, Flu A&B, Covid) Anterior Nasal Swab     Status: None   Collection Time: 08/18/24  4:20 PM   Specimen: Anterior Nasal Swab  Result Value Ref Range Status   SARS Coronavirus 2 by RT PCR NEGATIVE NEGATIVE Final    Comment: (NOTE) SARS-CoV-2 target nucleic acids are NOT DETECTED.  The SARS-CoV-2 RNA is generally detectable in upper respiratory specimens during the acute phase of infection. The lowest concentration of SARS-CoV-2 viral copies this assay can detect is 138 copies/mL. A negative result does not preclude SARS-Cov-2 infection and should not be used as the sole basis for treatment or other patient management decisions. A negative result may occur with  improper specimen collection/handling,  submission of specimen other than nasopharyngeal swab, presence of viral mutation(s) within the areas targeted by this assay, and inadequate number of viral copies(<138 copies/mL). A negative result must be combined with clinical observations, patient history, and epidemiological information. The expected result is Negative.  Fact Sheet for Patients:  bloggercourse.com  Fact Sheet for Healthcare Providers:  seriousbroker.it  This test is no t yet approved or cleared by the [place]  FDA and  has been authorized for detection and/or diagnosis of SARS-CoV-2 by FDA under an Emergency Use Authorization (EUA). This EUA will remain  in effect (meaning this test can be used) for the duration of the COVID-19 declaration under Section 564(b)(1) of the Act, 21 U.S.C.section 360bbb-3(b)(1), unless the authorization is terminated  or revoked sooner.       Influenza A by PCR NEGATIVE NEGATIVE Final   Influenza B by PCR NEGATIVE NEGATIVE Final    Comment: (NOTE) The Xpert Xpress SARS-CoV-2/FLU/RSV plus assay is intended as an aid in the diagnosis of influenza from Nasopharyngeal swab specimens and should not be used as a sole basis for treatment. Nasal washings and aspirates are unacceptable for Xpert Xpress SARS-CoV-2/FLU/RSV testing.  Fact Sheet for Patients: bloggercourse.com  Fact Sheet for Healthcare Providers: seriousbroker.it  This test is not yet approved or cleared by the [place]  FDA and has been authorized for detection and/or diagnosis of SARS-CoV-2 by FDA under an Emergency Use Authorization (EUA). This EUA will remain in effect (meaning this test can be used) for the duration of the COVID-19 declaration under Section 564(b)(1) of the Act, 21 U.S.C. section 360bbb-3(b)(1), unless the authorization is terminated or revoked.     Resp Syncytial Virus by PCR NEGATIVE  NEGATIVE Final    Comment: (NOTE) Fact Sheet for Patients: bloggercourse.com  Fact Sheet for Healthcare Providers: seriousbroker.it  This test is not yet approved or cleared by the [place]  FDA and has been authorized for detection and/or diagnosis of SARS-CoV-2 by FDA under an Emergency Use Authorization (EUA). This EUA will remain in effect (meaning this test can be used) for the duration of the COVID-19 declaration under Section 564(b)(1) of the Act, 21 U.S.C. section 360bbb-3(b)(1), unless the authorization is terminated or revoked.  Performed at [facility], [address], [place], [place] [address]   Culture, blood (routine x 2)     Status: None   Collection Time: 08/18/24  4:20 PM   Specimen: BLOOD LEFT FOREARM  Result Value Ref Range Status   Specimen Description   Final    BLOOD LEFT FOREARM Performed at [facility], [address], [place], [place] [address]    Special Requests   Final    BOTTLES DRAWN AEROBIC AND ANAEROBIC Blood Culture adequate volume Performed at [facility], [address] [place], [place] [address]    Culture   Final    NO GROWTH 5 DAYS Performed at [facility], [address], [place], [place] [address]    Report Status 08/23/2024 FINAL  Final  Respiratory (~20 pathogens) panel by PCR     Status: None   Collection Time: 08/18/24  4:20 PM   Specimen: Nasopharyngeal Swab; Respiratory  Result Value Ref Range Status   Adenovirus NOT DETECTED NOT DETECTED Final   Coronavirus 229E NOT DETECTED NOT DETECTED Final    Comment: (NOTE) The Coronavirus on the Respiratory Panel, DOES NOT test for the novel  Coronavirus (2019 nCoV)    Coronavirus HKU1 NOT DETECTED NOT DETECTED Final   Coronavirus NL63 NOT DETECTED NOT DETECTED Final   Coronavirus OC43 NOT DETECTED NOT DETECTED Final   Metapneumovirus NOT DETECTED NOT DETECTED Final    Rhinovirus / Enterovirus NOT DETECTED NOT DETECTED Final   Influenza A NOT DETECTED NOT DETECTED Final   Influenza B NOT DETECTED NOT DETECTED Final   Parainfluenza Virus 1 NOT DETECTED NOT DETECTED Final   Parainfluenza Virus 2 NOT DETECTED NOT DETECTED Final   Parainfluenza Virus 3 NOT DETECTED NOT DETECTED Final   Parainfluenza Virus 4 NOT DETECTED NOT DETECTED Final   Respiratory Syncytial Virus NOT DETECTED NOT DETECTED Final   Bordetella pertussis NOT DETECTED NOT DETECTED Final   Bordetella Parapertussis NOT DETECTED NOT DETECTED Final   Chlamydophila pneumoniae NOT DETECTED NOT DETECTED Final   Mycoplasma pneumoniae NOT DETECTED NOT DETECTED Final    Comment: Performed at [facility], [address] [place], [place] [address]  Culture, blood (routine x 2)     Status: None   Collection Time: 08/18/24  6:11 PM   Specimen: BLOOD RIGHT ARM  Result Value Ref Range Status   Specimen Description   Final    BLOOD RIGHT ARM ANAEROBIC BOTTLE  ONLY Performed at [facility], [address] [place], [place] [address]    Special Requests   Final    BOTTLES DRAWN AEROBIC AND ANAEROBIC Blood Culture adequate volume Performed at [facility], [address] [place], [place] [address]    Culture   Final    NO GROWTH 5 DAYS Performed at [facility], [address], [place], [place] [address]    Report Status 08/23/2024 FINAL  Final    Labs: CBC: No results for input(s): WBC, NEUTROABS, HGB, HCT, MCV, PLT in the last 168 hours.  Basic Metabolic Panel: No results for input(s): NA, K, CL, CO2, GLUCOSE, BUN, CREATININE, CALCIUM , MG, PHOS in the last 168 hours.  Liver Function Tests: No results for input(s): AST, ALT, ALKPHOS, BILITOT, PROT, ALBUMIN in the last 168 hours.  CBG: No results for input(s): GLUCAP in the last 168 hours.  Discharge time spent: greater than 30  minutes.  Signed: Burnard DELENA Cunning, DO [facility] 09/02/2024

## 2024-08-21 NOTE — Progress Notes (Signed)
 Communicated with patient via video tablet. Patient appears comfortable on the couch. AVS paperwork given to the patient. Discharge instructions discussed. Patient's question answered to satisfaction. Patient agreeable with discharge plan.

## 2024-08-21 NOTE — Progress Notes (Signed)
 Pt seen for routine [facility] visit this morning prior to discharge. Pt appears well and states he slept well.   Vital signs and assessment obtianed as noted.  Port flushed and medications administered as noted.  Pt had virtual visit with Dr. Fausto and is agreeable to discharge today.   IMM letter signed at 09:05am and placed in shadow chart. 1 copy of IMM left with Pt.  Pt informed we will be back in a few hours for full discharge visit and to de-access port.

## 2024-08-21 NOTE — Progress Notes (Signed)
 This clinical research associate arrived at patients house to find him sitting in his recliner. This clinical research associate arrived to discharge the patient. This clinical research associate handed patient his AVS, the nurse called and went over with the patient. This clinical research associate collected all of H at [facility]. The patient asked a couple questions and was good with everything. This clinical research associate reminded patient to call if he needed something. He thanked me for our service and the program. This clinical research associate left the patients house. ---------------------------------------------------------------------------------------------- Nat SQUIBB. Franchot, EMT

## 2024-08-21 NOTE — TOC Transition Note (Addendum)
 Transition of Care [facility]) - Discharge Note   Patient Details  Name: [name] MRN: [id number] Date of Birth: October 20, 1950  Transition of [facility] CM/SW Contact:  Rosalva Jon Bloch, RN Phone Number: 08/21/2024, 10:44 AM   Clinical Narrative:           [facility]  Per hospital provider patient ready for DC today. RN, and patient aware of DC plan.  Pt without RX med concerns or transportation issues. NCM arranged post hospital f/u  and noted on AVS, pt made aware.  RNCM will sign off for now as intervention is no longer needed. Please consult us  again if new needs arise.    Final next level of care: Home/Self Care Barriers to Discharge: No Barriers Identified   Patient Goals and CMS Choice Patient states their goals for this hospitalization and ongoing recovery are:: home     [place] ownership interest in [facility].provided to:: Patient    Discharge Placement                       Discharge Plan and Services Additional resources added to the After Visit Summary for     Discharge Planning Services: CM Consult            DME Arranged: N/A DME Agency: NA       HH Arranged: NA HH Agency: NA        Social Drivers of Health (SDOH) Interventions SDOH Screenings   Food Insecurity: No Food Insecurity (08/19/2024)  Housing: Low Risk  (08/19/2024)  Transportation Needs: No Transportation Needs (08/19/2024)  Utilities: Not At Risk (08/19/2024)  Alcohol  Screen: Low Risk  (04/13/2024)  Depression (PHQ2-9): Low Risk  (08/02/2024)  Financial Resource Strain: Low Risk  (08/01/2024)  Physical Activity: Inactive (08/01/2024)  Social Connections: Socially Integrated (08/18/2024)  Stress: No Stress Concern Present (08/01/2024)  Tobacco Use: Medium Risk (08/19/2024)     Readmission Risk Interventions    04/21/2024   12:07 PM 03/31/2024   11:48 AM  Readmission Risk Prevention Plan  Transportation Screening Complete Complete  PCP or Specialist Appt  within 3-5 Days  Complete  HRI or Home Care Consult  Complete  Social Work Consult for Recovery Care Planning/Counseling  Complete  Palliative Care Screening  Not Applicable  Medication Review Oceanographer) Complete Complete  PCP or Specialist appointment within 3-5 days of discharge Complete   HRI or Home Care Consult Complete   Palliative Care Screening Not Applicable   Skilled Nursing Facility Not Applicable

## 2024-08-22 ENCOUNTER — Telehealth: Payer: Self-pay | Admitting: *Deleted

## 2024-08-22 NOTE — Transitions of Care (Post Inpatient/ED Visit) (Signed)
 08/22/2024  Name: [name] MRN: [id number] DOB: 1951/05/20  Today's TOC FU Call Status: Today's TOC FU Call Status:: Successful TOC FU Call Completed TOC FU Call Complete Date: 08/22/24  Patient's Name and Date of Birth confirmed. Name, DOB  Transition Care Management Follow-up Telephone Call Date of Discharge: 08/21/24 Discharge Facility: [facility] at Home) Type of Discharge: Inpatient Admission Primary Inpatient Discharge Diagnosis:: Pneumonia How have you been since you were released from the hospital?: Better Any questions or concerns?: No  Items Reviewed: Did you receive and understand the discharge instructions provided?: Yes Medications obtained,verified, and reconciled?: Yes (Medications Reviewed) Any new allergies since your discharge?: No Dietary orders reviewed?: Yes Type of Diet Ordered:: low sodium heart healthy Do you have support at home?: Yes People in Home [RPT]: spouse Name of Support/Comfort Primary Source: spouse/Jane  Medications Reviewed Today: Medications Reviewed Today     Reviewed by Lucky Andrea DELENA, RN (Registered Nurse) on 08/22/24 at 0920  Med List Status: <None>   Medication Order Taking? Sig Documenting Provider Last Dose Status Informant  albuterol  (PROVENTIL ) (2.5 MG/3ML) 0.083% nebulizer solution [id number] Yes Take 3 mLs (2.5 mg total) by nebulization every 6 (six) hours as needed for wheezing or shortness of breath. [name], Jacob T, PA-C  Active Self, Pharmacy Records  albuterol  (VENTOLIN  HFA) [phone] Base) MCG/ACT inhaler [id number] Yes Inhale 2 puffs into the lungs every 6 (six) hours as needed for wheezing or shortness of breath. [provider]  Active Self, Pharmacy Records  allopurinol  (ZYLOPRIM ) 300 MG tablet [id number] Yes Take 300 mg by mouth daily. [provider]  Active Self, Pharmacy Records  amLODipine  (NORVASC ) 5 MG tablet [id number] Yes Take 1 tablet (5 mg total) by mouth daily. [name],  Jacob T, PA-C  Active Self, Pharmacy Records  apixaban  (ELIQUIS ) 5 MG TABS tablet [id number] Yes Take 1 tablet by mouth twice daily Revankar, Rajan R, MD  Active Self, Pharmacy Records  Ascorbic Acid (VITAMIN C PO) [id number] Yes Take 1,200 mcg by mouth daily. [provider]  Active Self, Pharmacy Records  azelastine  (ASTELIN ) 0.1 % nasal spray [id number] Yes Place 2 sprays into both nostrils 2 (two) times daily. Use in each nostril as directed Geronimo Amel, MD  Active Self, Pharmacy Records  azithromycin  (ZITHROMAX ) 500 MG tablet [id number] Yes Take 1 tablet (500 mg total) by mouth daily for 3 days. Fausto Sor A, DO  Active   betamethasone , augmented, (DIPROLENE ) 0.05 % lotion [id number] Yes Apply 1 Application topically daily as needed (dry skin on scalp). [provider]  Active Self, Pharmacy Records  budesonide -glycopyrrolate -formoterol  (BREZTRI ) 160-9-4.8 MCG/ACT AERO inhaler [id number] Yes Inhale 2 puffs into the lungs 2 (two) times daily. Geronimo Amel, MD  Active Self, Pharmacy Records  Buprenorphine HCl-Naloxone HCl 8-2 MG FILM [id number] Yes Place 0.33-1 Film under the tongue 2 (two) times daily as needed (back pain). [provider]  Active Self, Pharmacy Records  cefdinir  (OMNICEF ) 300 MG capsule [id number] Yes Take 1 capsule (300 mg total) by mouth 2 (two) times daily for 3 days. Fausto Sor A, DO  Active   ciclopirox  (PENLAC ) 8 % solution [id number] Yes Apply topically at bedtime. Apply a thin coat over nail. Apply daily over previous coat. Remove weekly with nail polish remover.  Patient taking differently: Apply topically daily as needed. Apply a thin coat over nail. Apply daily over previous coat. Remove weekly with nail polish remover.   McCaughan, Dia D, DPM  Active Self, Pharmacy Records  Med Note (HAGOPIAN, TYELISHA L   Mon Apr 17, 2024  6:50 PM)    ezetimibe  (ZETIA ) 10 MG tablet [id number] Yes Take 10 mg by mouth daily. [provider]  Active Self, Pharmacy Records  Ferrous Sulfate  (IRON ) 325 (65 Fe) MG TABS [id number] Yes Take 1 tablet (325 mg total) by mouth daily.  Patient taking differently: Take 1 tablet by mouth at bedtime.   [name], Jacob T, PA-C  Active Self, Pharmacy Records  finasteride  (PROSCAR ) 5 MG tablet [id number] Yes Take 5 mg by mouth at bedtime. [provider]  Active Self, Pharmacy Records  furosemide  (LASIX ) 20 MG tablet [id number] Yes Take 1 tablet (20 mg total) by mouth daily. Take extra 20 mg for weight gain of 3 pounds a day or 5 pounds in a week. Revankar, Jennifer SAUNDERS, MD  Active Self, Pharmacy Records  guaiFENesin  [employer]) 100 MG/5ML liquid [id number] Yes Take 5 mLs by mouth every 4 (four) hours as needed for cough or to loosen phlegm. Fausto Burnard LABOR, DO  Active   levothyroxine  (SYNTHROID ) 175 MCG tablet [id number] Yes Take 1 tablet (175 mcg total) by mouth daily before breakfast. [name], Lang DASEN, PA-C  Active Self, Pharmacy Records  LINZESS  290 MCG CAPS capsule [id number] Yes Take 1 capsule (290 mcg total) by mouth daily before breakfast. [name], Jacob T, PA-C  Active Self, Pharmacy Records  Magnesium  Chloride 64 MG TABS [id number] Yes Take 64 mg by mouth daily in the afternoon. Carlin Delon BROCKS, NP  Active Self, Pharmacy Records  metoprolol  succinate (TOPROL -XL) 25 MG 24 hr tablet [id number] Yes Take 1 tablet by mouth once daily Revankar, Rajan R, MD  Active Self, Pharmacy Records  OHTUVAYRE  3 MG/2.5ML SUSP [id number] Yes Inhale into the lungs as needed. NEB SOLUTION  Patient taking differently: Inhale into the lungs 2 (two) times daily. NEB SOLUTION   [provider]  Active Self, Pharmacy Records  omeprazole  (PRILOSEC) 20 MG capsule [id number] Yes Take 1 capsule (20 mg total) by mouth 2 (two) times daily. [name], Jacob T, PA-C  Active Self, Pharmacy Records  OXYGEN  [id number] Yes Inhale 2 L/min into the lungs at bedtime. Inhale 2 L/min into the lungs at bedtime and as  needed for shortness of breath during the daytime  Patient taking differently: Inhale 2 L/min into the lungs continuous. Inhale 2 L/min into the lungs at bedtime and as needed for shortness of breath during the daytime   [provider]  Active Self, Pharmacy Records  Pitavastatin  Calcium  1 MG TABS [id number]  Take 1 tablet (1 mg total) by mouth daily.  Patient not taking: Reported on 08/22/2024   Revankar, Jennifer SAUNDERS, MD  Active Self, Pharmacy Records  predniSONE  (DELTASONE ) 10 MG tablet [id number] Yes Take 4 tablets (40 mg total) by mouth daily with breakfast for 1 day, THEN 3 tablets (30 mg total) daily with breakfast for 1 day, THEN 2 tablets (20 mg total) daily with breakfast for 1 day, THEN 1 tablet (10 mg total) daily with breakfast for 1 day. Fausto Burnard A, DO  Active   sodium chloride  (OCEAN) 0.65 % SOLN nasal spray [id number] Yes Place 1 spray into both nostrils as needed for congestion. [provider]  Active Self, Pharmacy Records  sodium chloride  HYPERTONIC 3 % nebulizer solution [id number]  Take by nebulization 2 (two) times daily. 3 mL 3% saline nebulizer twice daily  Patient not taking: Reported on 08/22/2024   Geronimo Amel, MD  Active Self, Pharmacy Records  tamsulosin  (FLOMAX )  0.4 MG CAPS capsule [id number] Yes Take 1 capsule (0.4 mg total) by mouth daily. [name], Jacob T, PA-C  Active Self, Pharmacy Records  zolpidem  (AMBIEN ) 10 MG tablet [id number] Yes Take 1 tablet (10 mg total) by mouth at bedtime as needed for sleep. [name], Lang DASEN, PA-C  Active Self, Pharmacy Records            Home Care and Equipment/Supplies: Were Home Health Services Ordered?: No Any new equipment or medical supplies ordered?: No  Functional Questionnaire: Do you need assistance with bathing/showering or dressing?: No Do you need assistance with meal preparation?: No Do you need assistance with eating?: No Do you have difficulty maintaining continence: No Do you need  assistance with getting out of bed/getting out of a chair/moving?: No Do you have difficulty managing or taking your medications?: No  Follow up appointments reviewed: PCP Follow-up appointment confirmed?: Yes Date of PCP follow-up appointment?: 08/24/24 Follow-up Provider: Lang Alberta, PA Specialist Hospital Follow-up appointment confirmed?: NA Do you need transportation to your follow-up appointment?: No Do you understand care options if your condition(s) worsen?: Yes-patient verbalized understanding  SDOH Interventions Today    Flowsheet Row Most Recent Value  SDOH Interventions   Food Insecurity Interventions Intervention Not Indicated  Housing Interventions Intervention Not Indicated  Transportation Interventions Intervention Not Indicated  Utilities Interventions Intervention Not Indicated    Andrea Dimes RN, BSN [place]  [facility] RN Care Manager [phone]

## 2024-08-23 LAB — CULTURE, BLOOD (ROUTINE X 2)
Culture: NO GROWTH
Culture: NO GROWTH
Special Requests: ADEQUATE
Special Requests: ADEQUATE

## 2024-08-24 ENCOUNTER — Ambulatory Visit (INDEPENDENT_AMBULATORY_CARE_PROVIDER_SITE_OTHER): Admitting: Student

## 2024-08-24 ENCOUNTER — Encounter (HOSPITAL_BASED_OUTPATIENT_CLINIC_OR_DEPARTMENT_OTHER): Payer: Self-pay | Admitting: Student

## 2024-08-24 VITALS — BP 155/75 | HR 68 | Temp 98.2°F | Resp 16 | Ht 72.0 in | Wt 221.4 lb

## 2024-08-24 DIAGNOSIS — J189 Pneumonia, unspecified organism: Secondary | ICD-10-CM

## 2024-08-24 DIAGNOSIS — I10 Essential (primary) hypertension: Secondary | ICD-10-CM | POA: Diagnosis not present

## 2024-08-24 DIAGNOSIS — J9611 Chronic respiratory failure with hypoxia: Secondary | ICD-10-CM | POA: Diagnosis not present

## 2024-08-24 DIAGNOSIS — D638 Anemia in other chronic diseases classified elsewhere: Secondary | ICD-10-CM

## 2024-08-24 DIAGNOSIS — J309 Allergic rhinitis, unspecified: Secondary | ICD-10-CM

## 2024-08-24 NOTE — Patient Instructions (Signed)
 It was nice to see you today!  As we discussed in clinic:  I would like to recheck a chest xray on you in 4-6 weeks to make sure the pneumonia is totally resolved.  Let me know if you have not heard about the appointment for the speech and language pathologist to discuss your swallowing.  If you have any problems before your next visit feel free to message me via MyChart (minor issues or questions) or call the office, otherwise you may reach out to schedule an office visit.  Thank you! Courtney Fenlon, PA-C

## 2024-08-24 NOTE — Progress Notes (Signed)
 Established Patient Office Visit  Subjective   Patient ID: [name], male    DOB: 01-27-1951  Age: 74 y.o. MRN: [id number]  Chief Complaint  Patient presents with   Hospitalization Follow-up    Hospital follow up on 08/18/2024. Finished antibiotics today. Will be done tomorrow with prednisone . Head feels stocked up. Having nasal drainage.    HPI  Discussed the use of AI scribe software for clinical note transcription with the patient, who gave verbal consent to proceed.  History of Present Illness   [name] is a 73 year old male who presents for follow-up after recent hospitalization for pneumonia.  He was hospitalized from November 26th to December 1st due to pneumonia, treated with IV antibiotics and steroids. His oxygen  saturation was 75% upon admission, requiring four liters of oxygen , later reduced to two liters. He is currently on two liters of oxygen  at home. He was negative for RSV, flu, and COVID. A chest x-ray showed pneumonia in the left lower lobe and lingula. He was discharged to a hospital-at-home program, which provided him with medical equipment and support at home, although he experienced issues with the program, including poor cleanup and communication.  He was treated with azithromycin  and IV Rocephin  (ceftriaxone ) during his hospitalization. He completed his antibiotics and prednisone  course recently. His oxygen  levels have improved, staying between 93% to 95% without supplemental oxygen .  He has a history of anemia of chronic disease, which has been stable. He received a blood transfusion in the past, which temporarily improved his hemoglobin levels. His potassium levels were high during hospitalization but normalized to 4.2 before discharge. His calcium  and protein levels were low during hospitalization.  He is currently taking amlodipine  5 mg for blood pressure management, but there was an issue with receiving his prescription. He also takes metoprolol .  His blood pressure at home ranges from 117 to 130 systolic, occasionally reaching 140.  He experiences nasal congestion, which he attributes to allergies. He uses azelastine  nasal spray and Mucinex  but does not take an oral allergy pill. He has Zyrtec at home and plans to use it to manage his symptoms.     Patient Active Problem List   Diagnosis Date Noted   Anemia, chronic disease 08/24/2024   Physical deconditioning 08/20/2024   Pneumonia 08/18/2024   COPD exacerbation (HCC) 04/17/2024   Hyperkalemia 03/31/2024   Multifocal pneumonia 03/31/2024   Sepsis due to pneumonia (HCC) 03/30/2024   Statin myopathy 02/21/2024   Chronic constipation 02/21/2024   Primary osteoarthritis of left distal radioulnar joint 02/21/2024   Primary osteoarthritis of right distal radioulnar joint 01/28/2024   Idiopathic chronic gout of multiple sites without tophus 01/24/2024   Cubital tunnel syndrome on right 07/09/2023   COPD with acute exacerbation (HCC) 05/26/2023   Chronic diastolic CHF (congestive heart failure) (HCC) 05/07/2023   Acute diastolic CHF (congestive heart failure) (HCC) 05/07/2023   Acute on chronic diastolic CHF (congestive heart failure) (HCC) 05/06/2023   Acute on chronic respiratory failure with hypoxia (HCC) 05/06/2023   DOE (dyspnea on exertion) 04/07/2023   Congestive heart failure (HCC) 01/26/2023   GERD (gastroesophageal reflux disease) 01/26/2023   Obesity 01/26/2023   Polycythemia 01/26/2023   Pulmonary hypertension (HCC) 01/26/2023   Aortic atherosclerosis 01/26/2023   Coronary artery calcification 01/26/2023   Carpal tunnel syndrome, right upper limb 11/03/2022   Lesion of ulnar nerve, right upper limb 11/03/2022   Numbness of right hand 06/28/2022   Status post bronchoscopy with biopsy  Port catheter in place 08/20/2016   AKI (acute kidney injury) 01/14/2016   COPD, severe (HCC) 11/27/2015   NHL (non-Hodgkin's lymphoma) (HCC) 09/25/2015   Lymphadenopathy syndrome  09/12/2015   Mediastinal adenopathy 09/12/2015   Chronic respiratory failure with hypoxia (HCC) 08/27/2015   Gout 08/29/2012   Hypothyroidism 03/30/2012   Depression 12/03/2011   Pure hypercholesterolemia 12/03/2011   Paroxysmal atrial fibrillation (HCC) 10/26/2011   Dysphagia 11/11/2010   Squamous cell carcinoma lung (HCC) 09/26/2010   BPH (benign prostatic hyperplasia) 2011   ERECTILE DYSFUNCTION 09/24/2008   Essential hypertension 06/05/2008   COPD (chronic obstructive pulmonary disease) (HCC) 06/05/2008   Past Medical History:  Diagnosis Date   Acute diastolic CHF (congestive heart failure) (HCC) 05/07/2023   Acute on chronic diastolic CHF (congestive heart failure) (HCC) 05/06/2023   Acute on chronic respiratory failure with hypoxia (HCC) 05/06/2023   Anemia    Aortic atherosclerosis 01/26/2023   Atrial fibrillation (HCC) 10/26/2011   BPH (benign prostatic hyperplasia) 2011   Carpal tunnel syndrome, right upper limb 11/03/2022   Chronic constipation 02/21/2024   Chronic diastolic CHF (congestive heart failure) (HCC) 05/07/2023   Chronic respiratory failure with hypoxia (HCC) 08/27/2015   Congestive heart failure (HCC)    COPD (chronic obstructive pulmonary disease) (HCC)    COPD exacerbation (HCC) 04/17/2024   COPD with acute exacerbation (HCC) 05/26/2023   COPD, severe (HCC) 11/27/2015   12/18/2015-pulmonary function test- FVC 2.75 (55% predicted), postbronchodilator ratio 56, postbronchodilator FEV1 1.66 (45% predicted), no significant bronchodilator response, mid flow reversibility, DLCO 33  >>>Severe obstructive airways disease, Severe diffusion defect        Coronary artery calcification 01/26/2023   Cubital tunnel syndrome on right 07/09/2023   Depression    DOE (dyspnea on exertion) 04/07/2023   Essential hypertension 06/05/2008   Qualifier: Diagnosis of   By: [name]), [name]       GERD (gastroesophageal reflux disease)    Hyperkalemia 03/31/2024    Hypothyroidism 03/30/2012   Idiopathic chronic gout of multiple sites without tophus 01/24/2024   Lesion of ulnar nerve, right upper limb 11/03/2022   Lymphadenopathy syndrome 09/12/2015   Mediastinal adenopathy 09/12/2015   Multifocal pneumonia 03/31/2024   NHL (non-Hodgkin's lymphoma) (HCC) 09/25/2015   Numbness of right hand 06/28/2022   Obesity    Other dysphagia 11/11/2010   Qualifier: Diagnosis of   By: [name], [name]       Polycythemia 01/26/2023   Port catheter in place 08/20/2016   Primary osteoarthritis of left distal radioulnar joint 02/21/2024   Primary osteoarthritis of right distal radioulnar joint 01/28/2024   Pulmonary hypertension (HCC)    Pure hypercholesterolemia 12/03/2011   Sepsis due to pneumonia (HCC) 03/30/2024   Squamous cell carcinoma lung (HCC) 09/26/2010   S/p LUL resection Dr Brantley dec 2011      Statin myopathy 02/21/2024   Status post bronchoscopy with biopsy    Social History   Tobacco Use   Smoking status: Former    Current packs/day: 0.00    Average packs/day: 3.0 packs/day for 43.0 years (129.0 ttl pk-yrs)    Types: Cigarettes    Start date: 10/22/1958    Quit date: 10/22/2001    Years since quitting: 22.8    Passive exposure: Current   Smokeless tobacco: Never  Vaping Use   Vaping status: Never Used  Substance Use Topics   Alcohol  use: Never   Drug use: Never   Allergies  Allergen Reactions   Hydrochlorothiazide  Other (See Comments)  Dried out the patient too much   Lipitor [Atorvastatin ] Other (See Comments)    Myalgias  Weakness  Fatigue    Omnipaque  [Iohexol ] Itching and Other (See Comments)    When asked about contrast media today, pt stated that last time he had the contrast that he was red all over and itchy from head to toe.    Tape Other (See Comments)    Band-Aids irritate the skin      ROS Per HPI.    Objective:     BP (!) 155/75   Pulse 68   Temp 98.2 F (36.8 C) (Oral)   Resp 16   Ht 6' (1.829 m)    Wt 221 lb 6.4 oz (100.4 kg)   SpO2 95%   PF (!) 2 L/min   BMI 30.03 kg/m  BP Readings from Last 3 Encounters:  08/24/24 (!) 155/75  08/21/24 (!) 156/76  08/18/24 (!) 128/59   Wt Readings from Last 3 Encounters:  08/24/24 221 lb 6.4 oz (100.4 kg)  08/22/24 206 lb (93.4 kg)  08/19/24 213 lb 6.4 oz (96.8 kg)   SpO2 Readings from Last 3 Encounters:  08/24/24 95%  08/21/24 96%  08/18/24 93%      Physical Exam Constitutional:      General: He is not in acute distress.    Appearance: Normal appearance. He is not ill-appearing.  HENT:     Head: Normocephalic and atraumatic.     Right Ear: External ear normal.     Left Ear: External ear normal.     Nose: Nose normal.  Eyes:     Conjunctiva/sclera: Conjunctivae normal.  Cardiovascular:     Rate and Rhythm: Normal rate and regular rhythm.     Pulses: Normal pulses.     Heart sounds: Normal heart sounds. No murmur heard.    No friction rub.  Pulmonary:     Effort: Pulmonary effort is normal. No respiratory distress.     Breath sounds: Normal breath sounds. No rales.     Comments: Mild rhonchi left in LLL. Otherwise at baseline with some wheezes generalized throughout. Skin:    General: Skin is warm and dry.     Coloration: Skin is not jaundiced or pale.  Neurological:     Mental Status: He is alert.  Psychiatric:        Mood and Affect: Mood normal.        Behavior: Behavior normal.      No results found for any visits on 08/24/24.  Last CBC Lab Results  Component Value Date   WBC 9.4 08/20/2024   HGB 10.1 (L) 08/20/2024   HCT 32.5 (L) 08/20/2024   MCV 90.3 08/20/2024   MCH 28.1 08/20/2024   RDW 14.5 08/20/2024   PLT 171 08/20/2024   Last metabolic panel Lab Results  Component Value Date   GLUCOSE 108 (H) 08/20/2024   NA 143 08/20/2024   K 4.2 08/20/2024   CL 103 08/20/2024   CO2 28 08/20/2024   BUN 31 (H) 08/20/2024   CREATININE 0.90 08/20/2024   GFRNONAA >60 08/20/2024   CALCIUM  8.7 (L)  08/20/2024   PHOS 2.7 04/19/2024   PROT 5.5 (L) 08/20/2024   ALBUMIN 3.1 (L) 08/20/2024   LABGLOB 2.1 04/07/2023   BILITOT 0.3 08/20/2024   ALKPHOS 88 08/20/2024   AST 17 08/20/2024   ALT 16 08/20/2024   ANIONGAP 12 08/20/2024   Last lipids Lab Results  Component Value Date   CHOL 162 08/02/2024  HDL 49 08/02/2024   LDLCALC 104 (H) 08/02/2024   LDLDIRECT 99 05/18/2023   TRIG 39 08/02/2024   CHOLHDL 3.3 08/02/2024   Last hemoglobin A1c Lab Results  Component Value Date   HGBA1C 5.6 03/01/2024      The 10-year ASCVD risk score (Arnett DK, et al., 2019) is: 31.5%    Assessment & Plan:   Assessment and Plan    Community acquired pneumonia Recent hospitalization for pneumonia with left lower lobe and lingula involvement. Negative for RSV, flu, and COVID. Treated with IV antibiotics and steroids. Currently on 2 liters of oxygen  with improved oxygen  saturation. Completed oral azithromycin  and IV ceftriaxone . Nasal congestion likely due to environmental factors. - Ordered chest x-ray in 4-6 weeks to assess resolution of pneumonia - Continue current oxygen  therapy at 2 liters - Use azelastine  nasal spray for nasal congestion - Consider Zyrtec for allergy symptoms, take at night due to potential drowsiness  Chronic respiratory failure Managed with home oxygen  therapy. Oxygen  saturation improved to 93-95% without supplemental oxygen . Continues on 2 liters of oxygen . - Continue home oxygen  therapy at 2 liters  Anemia of chronic disease Chronic anemia likely related to chronic disease and lung issues. Hemoglobin levels are stable and not dangerously low. Previous blood transfusion increased hemoglobin but not sustained. Transfusions not recommended due to risks and potential calcium  level drop. - Continue to monitor hemoglobin levels  Hypertension Managed with amlodipine  and metoprolol . Blood pressure readings at home range from 117 to 140 mmHg. Lisinopril  was discontinued due  to adverse effects. Current regimen appears effective. - Continue amlodipine  and metoprolol  - Monitor blood pressure at home - Bring blood pressure cuff to next appointment for accuracy check  Allergic rhinitis Nasal congestion likely due to environmental allergens. Currently using azelastine  nasal spray. No current use of oral antihistamines. - Continue azelastine  nasal spray - Consider Zyrtec for additional allergy symptom control, take at night     Return if symptoms worsen or fail to improve.   I personally spent a total of 48 minutes in the care of the patient today including preparing to see the patient, getting/reviewing separately obtained history, performing a medically appropriate exam/evaluation, counseling and educating, placing orders, and documenting clinical information in the EHR.   Ronita Hargreaves T Jeaneane Adamec, PA-C

## 2024-08-25 LAB — CBC WITH DIFFERENTIAL/PLATELET
Basophils Absolute: 0 x10E3/uL (ref 0.0–0.2)
Basos: 0 %
EOS (ABSOLUTE): 0.1 x10E3/uL (ref 0.0–0.4)
Eos: 1 %
Hematocrit: 38.6 % (ref 37.5–51.0)
Hemoglobin: 11.8 g/dL — ABNORMAL LOW (ref 13.0–17.7)
Immature Grans (Abs): 0.3 x10E3/uL — ABNORMAL HIGH (ref 0.0–0.1)
Immature Granulocytes: 3 %
Lymphocytes Absolute: 1 x10E3/uL (ref 0.7–3.1)
Lymphs: 10 %
MCH: 27.4 pg (ref 26.6–33.0)
MCHC: 30.6 g/dL — ABNORMAL LOW (ref 31.5–35.7)
MCV: 90 fL (ref 79–97)
Monocytes Absolute: 0.7 x10E3/uL (ref 0.1–0.9)
Monocytes: 7 %
Neutrophils Absolute: 7.7 x10E3/uL — ABNORMAL HIGH (ref 1.4–7.0)
Neutrophils: 79 %
Platelets: 253 x10E3/uL (ref 150–450)
RBC: 4.3 x10E6/uL (ref 4.14–5.80)
RDW: 13.4 % (ref 11.6–15.4)
WBC: 9.8 x10E3/uL (ref 3.4–10.8)

## 2024-08-28 ENCOUNTER — Ambulatory Visit (HOSPITAL_BASED_OUTPATIENT_CLINIC_OR_DEPARTMENT_OTHER): Payer: Self-pay | Admitting: Student

## 2024-10-08 ENCOUNTER — Encounter: Payer: Self-pay | Admitting: Internal Medicine

## 2024-10-11 ENCOUNTER — Telehealth: Payer: Self-pay | Admitting: Medical Oncology

## 2024-10-11 DIAGNOSIS — C3491 Malignant neoplasm of unspecified part of right bronchus or lung: Secondary | ICD-10-CM

## 2024-10-11 DIAGNOSIS — C8332 Diffuse large B-cell lymphoma, intrathoracic lymph nodes: Secondary | ICD-10-CM

## 2024-10-11 NOTE — Telephone Encounter (Signed)
 Port flush

## 2024-10-19 ENCOUNTER — Inpatient Hospital Stay: Attending: Internal Medicine

## 2024-11-02 ENCOUNTER — Ambulatory Visit (HOSPITAL_BASED_OUTPATIENT_CLINIC_OR_DEPARTMENT_OTHER): Admitting: Family Medicine

## 2024-11-27 ENCOUNTER — Ambulatory Visit: Admitting: Adult Health

## 2024-11-30 ENCOUNTER — Inpatient Hospital Stay
